# Patient Record
Sex: Female | Born: 1940 | Race: Black or African American | Hispanic: No | Marital: Married | Smoking: Never smoker
Health system: Southern US, Community
[De-identification: ages and names within clinical notes are randomized; demographics above are authoritative.]

## PROBLEM LIST (undated history)

## (undated) DIAGNOSIS — T7840XA Allergy, unspecified, initial encounter: Secondary | ICD-10-CM

## (undated) DIAGNOSIS — I10 Essential (primary) hypertension: Secondary | ICD-10-CM

## (undated) DIAGNOSIS — N189 Chronic kidney disease, unspecified: Secondary | ICD-10-CM

## (undated) DIAGNOSIS — J310 Chronic rhinitis: Secondary | ICD-10-CM

## (undated) DIAGNOSIS — R413 Other amnesia: Secondary | ICD-10-CM

## (undated) DIAGNOSIS — M25579 Pain in unspecified ankle and joints of unspecified foot: Secondary | ICD-10-CM

## (undated) DIAGNOSIS — E119 Type 2 diabetes mellitus without complications: Secondary | ICD-10-CM

## (undated) DIAGNOSIS — E785 Hyperlipidemia, unspecified: Secondary | ICD-10-CM

## (undated) DIAGNOSIS — M1990 Unspecified osteoarthritis, unspecified site: Secondary | ICD-10-CM

## (undated) HISTORY — DX: Essential (primary) hypertension: I10

## (undated) HISTORY — DX: Chronic rhinitis: J31.0

## (undated) HISTORY — PX: PARTIAL HYSTERECTOMY: SHX80

## (undated) HISTORY — DX: Other amnesia: R41.3

## (undated) HISTORY — DX: Pain in unspecified ankle and joints of unspecified foot: M25.579

## (undated) HISTORY — DX: Type 2 diabetes mellitus without complications: E11.9

## (undated) HISTORY — PX: OTHER SURGICAL HISTORY: SHX169

## (undated) HISTORY — DX: Allergy, unspecified, initial encounter: T78.40XA

## (undated) HISTORY — PX: CHOLECYSTECTOMY: SHX55

## (undated) HISTORY — DX: Chronic kidney disease, unspecified: N18.9

## (undated) HISTORY — DX: Hyperlipidemia, unspecified: E78.5

## (undated) HISTORY — PX: CATARACT EXTRACTION: SUR2

## (undated) HISTORY — DX: Unspecified osteoarthritis, unspecified site: M19.90

---
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
## 1998-05-05 ENCOUNTER — Encounter: Payer: Self-pay | Admitting: Internal Medicine

## 1998-05-05 ENCOUNTER — Ambulatory Visit (HOSPITAL_COMMUNITY): Admission: RE | Admit: 1998-05-05 | Discharge: 1998-05-05 | Payer: Self-pay | Admitting: Internal Medicine

## 1998-08-14 ENCOUNTER — Encounter: Payer: Self-pay | Admitting: Internal Medicine

## 1998-08-14 ENCOUNTER — Ambulatory Visit (HOSPITAL_COMMUNITY): Admission: RE | Admit: 1998-08-14 | Discharge: 1998-08-14 | Payer: Self-pay | Admitting: Internal Medicine

## 1999-09-09 ENCOUNTER — Encounter: Admission: RE | Admit: 1999-09-09 | Discharge: 1999-09-09 | Payer: Self-pay | Admitting: Internal Medicine

## 1999-09-09 ENCOUNTER — Encounter: Payer: Self-pay | Admitting: Internal Medicine

## 1999-09-10 ENCOUNTER — Encounter: Payer: Self-pay | Admitting: Internal Medicine

## 1999-09-10 ENCOUNTER — Ambulatory Visit (HOSPITAL_COMMUNITY): Admission: RE | Admit: 1999-09-10 | Discharge: 1999-09-10 | Payer: Self-pay | Admitting: Internal Medicine

## 2000-09-20 ENCOUNTER — Encounter: Payer: Self-pay | Admitting: Internal Medicine

## 2000-09-20 ENCOUNTER — Ambulatory Visit (HOSPITAL_COMMUNITY): Admission: RE | Admit: 2000-09-20 | Discharge: 2000-09-20 | Payer: Self-pay | Admitting: Internal Medicine

## 2001-09-27 ENCOUNTER — Encounter: Payer: Self-pay | Admitting: Internal Medicine

## 2001-09-27 ENCOUNTER — Ambulatory Visit (HOSPITAL_COMMUNITY): Admission: RE | Admit: 2001-09-27 | Discharge: 2001-09-27 | Payer: Self-pay | Admitting: Internal Medicine

## 2001-11-15 ENCOUNTER — Encounter: Payer: Self-pay | Admitting: Gastroenterology

## 2001-11-15 ENCOUNTER — Encounter: Admission: RE | Admit: 2001-11-15 | Discharge: 2001-11-15 | Payer: Self-pay | Admitting: Gastroenterology

## 2001-12-21 ENCOUNTER — Encounter: Payer: Self-pay | Admitting: Internal Medicine

## 2001-12-21 ENCOUNTER — Ambulatory Visit (HOSPITAL_COMMUNITY): Admission: RE | Admit: 2001-12-21 | Discharge: 2001-12-21 | Payer: Self-pay | Admitting: Internal Medicine

## 2002-03-06 ENCOUNTER — Ambulatory Visit (HOSPITAL_COMMUNITY): Admission: RE | Admit: 2002-03-06 | Discharge: 2002-03-06 | Payer: Self-pay | Admitting: Gastroenterology

## 2002-03-06 LAB — HM COLONOSCOPY: HM Colonoscopy: NORMAL

## 2002-10-03 ENCOUNTER — Encounter: Payer: Self-pay | Admitting: Internal Medicine

## 2002-10-03 ENCOUNTER — Ambulatory Visit (HOSPITAL_COMMUNITY): Admission: RE | Admit: 2002-10-03 | Discharge: 2002-10-03 | Payer: Self-pay | Admitting: Internal Medicine

## 2003-10-04 ENCOUNTER — Ambulatory Visit (HOSPITAL_COMMUNITY): Admission: RE | Admit: 2003-10-04 | Discharge: 2003-10-04 | Payer: Self-pay | Admitting: Internal Medicine

## 2004-05-03 ENCOUNTER — Emergency Department (HOSPITAL_COMMUNITY): Admission: EM | Admit: 2004-05-03 | Discharge: 2004-05-03 | Payer: Self-pay | Admitting: Emergency Medicine

## 2004-09-21 ENCOUNTER — Emergency Department (HOSPITAL_COMMUNITY): Admission: EM | Admit: 2004-09-21 | Discharge: 2004-09-21 | Payer: Self-pay | Admitting: *Deleted

## 2004-10-05 ENCOUNTER — Ambulatory Visit (HOSPITAL_COMMUNITY): Admission: RE | Admit: 2004-10-05 | Discharge: 2004-10-05 | Payer: Self-pay | Admitting: Internal Medicine

## 2005-10-07 ENCOUNTER — Ambulatory Visit (HOSPITAL_COMMUNITY): Admission: RE | Admit: 2005-10-07 | Discharge: 2005-10-07 | Payer: Self-pay | Admitting: Internal Medicine

## 2006-03-24 ENCOUNTER — Encounter: Admission: RE | Admit: 2006-03-24 | Discharge: 2006-03-24 | Payer: Self-pay | Admitting: Family Medicine

## 2006-05-16 ENCOUNTER — Encounter: Payer: Self-pay | Admitting: Internal Medicine

## 2006-08-30 ENCOUNTER — Ambulatory Visit: Payer: Self-pay | Admitting: Family Medicine

## 2006-08-30 DIAGNOSIS — M159 Polyosteoarthritis, unspecified: Secondary | ICD-10-CM | POA: Insufficient documentation

## 2006-08-30 DIAGNOSIS — I10 Essential (primary) hypertension: Secondary | ICD-10-CM | POA: Insufficient documentation

## 2006-09-05 ENCOUNTER — Ambulatory Visit: Payer: Self-pay | Admitting: Family Medicine

## 2006-09-09 ENCOUNTER — Telehealth (INDEPENDENT_AMBULATORY_CARE_PROVIDER_SITE_OTHER): Payer: Self-pay | Admitting: *Deleted

## 2006-09-09 LAB — CONVERTED CEMR LAB
AST: 30 units/L (ref 0–37)
BUN: 16 mg/dL (ref 6–23)
Creatinine, Ser: 1.5 mg/dL — ABNORMAL HIGH (ref 0.4–1.2)
Creatinine,U: 106.3 mg/dL
GFR calc Af Amer: 44 mL/min
GFR calc non Af Amer: 37 mL/min
Glucose, Bld: 176 mg/dL — ABNORMAL HIGH (ref 70–99)
HDL: 65.6 mg/dL (ref >39.0)
Microalb, Ur: 0.2 mg/dL (ref 0.0–1.9)
Potassium: 3.2 meq/L — ABNORMAL LOW (ref 3.5–5.1)
Total CHOL/HDL Ratio: 2.7

## 2006-09-12 ENCOUNTER — Ambulatory Visit: Payer: Self-pay | Admitting: Family Medicine

## 2006-09-13 ENCOUNTER — Telehealth (INDEPENDENT_AMBULATORY_CARE_PROVIDER_SITE_OTHER): Payer: Self-pay | Admitting: *Deleted

## 2006-09-13 LAB — CONVERTED CEMR LAB
Potassium: 2.9 meq/L — ABNORMAL LOW (ref 3.5–5.1)
TSH: 1.47 microintl units/mL (ref 0.35–5.50)

## 2006-09-19 ENCOUNTER — Encounter: Payer: Self-pay | Admitting: Internal Medicine

## 2006-09-26 ENCOUNTER — Telehealth (INDEPENDENT_AMBULATORY_CARE_PROVIDER_SITE_OTHER): Payer: Self-pay | Admitting: *Deleted

## 2006-09-26 ENCOUNTER — Ambulatory Visit: Payer: Self-pay | Admitting: Family Medicine

## 2006-09-26 LAB — CONVERTED CEMR LAB: Potassium: 3.6 meq/L (ref 3.5–5.1)

## 2006-09-27 ENCOUNTER — Telehealth (INDEPENDENT_AMBULATORY_CARE_PROVIDER_SITE_OTHER): Payer: Self-pay | Admitting: *Deleted

## 2006-10-18 ENCOUNTER — Ambulatory Visit (HOSPITAL_COMMUNITY): Admission: RE | Admit: 2006-10-18 | Discharge: 2006-10-18 | Payer: Self-pay | Admitting: Family Medicine

## 2006-10-19 ENCOUNTER — Encounter: Admission: RE | Admit: 2006-10-19 | Discharge: 2006-10-19 | Payer: Self-pay | Admitting: Family Medicine

## 2006-10-20 ENCOUNTER — Ambulatory Visit: Payer: Self-pay | Admitting: Family Medicine

## 2006-10-20 DIAGNOSIS — J3089 Other allergic rhinitis: Secondary | ICD-10-CM

## 2006-10-20 DIAGNOSIS — J302 Other seasonal allergic rhinitis: Secondary | ICD-10-CM | POA: Insufficient documentation

## 2006-11-03 ENCOUNTER — Encounter (INDEPENDENT_AMBULATORY_CARE_PROVIDER_SITE_OTHER): Payer: Self-pay | Admitting: Family Medicine

## 2006-11-10 ENCOUNTER — Telehealth (INDEPENDENT_AMBULATORY_CARE_PROVIDER_SITE_OTHER): Payer: Self-pay | Admitting: Family Medicine

## 2006-11-17 ENCOUNTER — Telehealth (INDEPENDENT_AMBULATORY_CARE_PROVIDER_SITE_OTHER): Payer: Self-pay | Admitting: *Deleted

## 2006-11-18 ENCOUNTER — Ambulatory Visit: Payer: Self-pay | Admitting: Family Medicine

## 2006-11-21 ENCOUNTER — Telehealth (INDEPENDENT_AMBULATORY_CARE_PROVIDER_SITE_OTHER): Payer: Self-pay | Admitting: *Deleted

## 2006-11-21 LAB — CONVERTED CEMR LAB
BUN: 20 mg/dL (ref 6–23)
CO2: 32 meq/L (ref 19–32)
Calcium: 9.2 mg/dL (ref 8.4–10.5)
GFR calc Af Amer: 41 mL/min
Glucose, Bld: 150 mg/dL — ABNORMAL HIGH (ref 70–99)
Potassium: 3.5 meq/L (ref 3.5–5.1)
Sodium: 142 meq/L (ref 135–145)

## 2006-11-30 ENCOUNTER — Encounter (INDEPENDENT_AMBULATORY_CARE_PROVIDER_SITE_OTHER): Payer: Self-pay | Admitting: Family Medicine

## 2006-12-02 ENCOUNTER — Ambulatory Visit: Payer: Self-pay | Admitting: Family Medicine

## 2006-12-02 ENCOUNTER — Telehealth (INDEPENDENT_AMBULATORY_CARE_PROVIDER_SITE_OTHER): Payer: Self-pay | Admitting: *Deleted

## 2006-12-04 LAB — CONVERTED CEMR LAB
Creatinine, Ser: 1.2 mg/dL (ref 0.4–1.2)
GFR calc non Af Amer: 47 mL/min
Potassium: 3.9 meq/L (ref 3.5–5.1)
Sodium: 142 meq/L (ref 135–145)

## 2006-12-05 ENCOUNTER — Telehealth (INDEPENDENT_AMBULATORY_CARE_PROVIDER_SITE_OTHER): Payer: Self-pay | Admitting: *Deleted

## 2006-12-06 ENCOUNTER — Encounter (INDEPENDENT_AMBULATORY_CARE_PROVIDER_SITE_OTHER): Payer: Self-pay | Admitting: Family Medicine

## 2006-12-06 ENCOUNTER — Telehealth (INDEPENDENT_AMBULATORY_CARE_PROVIDER_SITE_OTHER): Payer: Self-pay | Admitting: *Deleted

## 2006-12-08 ENCOUNTER — Encounter (INDEPENDENT_AMBULATORY_CARE_PROVIDER_SITE_OTHER): Payer: Self-pay | Admitting: Family Medicine

## 2006-12-08 DIAGNOSIS — M25579 Pain in unspecified ankle and joints of unspecified foot: Secondary | ICD-10-CM | POA: Insufficient documentation

## 2006-12-21 ENCOUNTER — Ambulatory Visit: Payer: Self-pay | Admitting: Family Medicine

## 2006-12-21 LAB — CONVERTED CEMR LAB
BUN: 18 mg/dL (ref 6–23)
CO2: 31 meq/L (ref 19–32)
Calcium: 8.9 mg/dL (ref 8.4–10.5)
Creatinine, Ser: 1.4 mg/dL — ABNORMAL HIGH (ref 0.4–1.2)
GFR calc Af Amer: 48 mL/min
GFR calc non Af Amer: 40 mL/min
Potassium: 4 meq/L (ref 3.5–5.1)
Sodium: 140 meq/L (ref 135–145)

## 2006-12-22 ENCOUNTER — Telehealth (INDEPENDENT_AMBULATORY_CARE_PROVIDER_SITE_OTHER): Payer: Self-pay | Admitting: *Deleted

## 2007-01-20 ENCOUNTER — Ambulatory Visit: Payer: Self-pay | Admitting: Family Medicine

## 2007-01-25 ENCOUNTER — Ambulatory Visit: Payer: Self-pay | Admitting: Family Medicine

## 2007-01-25 DIAGNOSIS — E785 Hyperlipidemia, unspecified: Secondary | ICD-10-CM | POA: Insufficient documentation

## 2007-01-27 ENCOUNTER — Encounter (INDEPENDENT_AMBULATORY_CARE_PROVIDER_SITE_OTHER): Payer: Self-pay | Admitting: *Deleted

## 2007-02-06 ENCOUNTER — Telehealth (INDEPENDENT_AMBULATORY_CARE_PROVIDER_SITE_OTHER): Payer: Self-pay | Admitting: *Deleted

## 2007-02-07 ENCOUNTER — Encounter (INDEPENDENT_AMBULATORY_CARE_PROVIDER_SITE_OTHER): Payer: Self-pay | Admitting: *Deleted

## 2007-02-08 ENCOUNTER — Telehealth (INDEPENDENT_AMBULATORY_CARE_PROVIDER_SITE_OTHER): Payer: Self-pay | Admitting: *Deleted

## 2007-02-08 ENCOUNTER — Ambulatory Visit: Payer: Self-pay | Admitting: Family Medicine

## 2007-02-08 LAB — CONVERTED CEMR LAB
ALT: 15 units/L (ref 0–35)
AST: 19 units/L (ref 0–37)

## 2007-02-09 ENCOUNTER — Encounter (INDEPENDENT_AMBULATORY_CARE_PROVIDER_SITE_OTHER): Payer: Self-pay | Admitting: *Deleted

## 2007-03-06 ENCOUNTER — Encounter (INDEPENDENT_AMBULATORY_CARE_PROVIDER_SITE_OTHER): Payer: Self-pay | Admitting: Family Medicine

## 2007-04-17 ENCOUNTER — Ambulatory Visit: Payer: Self-pay | Admitting: Family Medicine

## 2007-04-17 LAB — CONVERTED CEMR LAB
Cholesterol, target level: 200 mg/dL
LDL Goal: 100 mg/dL

## 2007-04-18 ENCOUNTER — Encounter (INDEPENDENT_AMBULATORY_CARE_PROVIDER_SITE_OTHER): Payer: Self-pay | Admitting: *Deleted

## 2007-04-18 LAB — CONVERTED CEMR LAB
Microalb, Ur: 2.5 mg/dL — ABNORMAL HIGH (ref 0.0–1.9)
VLDL: 25 mg/dL (ref 0–40)

## 2007-04-26 ENCOUNTER — Ambulatory Visit: Payer: Self-pay | Admitting: Family Medicine

## 2007-05-01 ENCOUNTER — Telehealth (INDEPENDENT_AMBULATORY_CARE_PROVIDER_SITE_OTHER): Payer: Self-pay | Admitting: *Deleted

## 2007-05-29 ENCOUNTER — Ambulatory Visit: Payer: Self-pay | Admitting: Internal Medicine

## 2007-06-01 ENCOUNTER — Encounter (INDEPENDENT_AMBULATORY_CARE_PROVIDER_SITE_OTHER): Payer: Self-pay | Admitting: *Deleted

## 2007-06-14 ENCOUNTER — Telehealth (INDEPENDENT_AMBULATORY_CARE_PROVIDER_SITE_OTHER): Payer: Self-pay | Admitting: *Deleted

## 2007-06-15 ENCOUNTER — Ambulatory Visit: Payer: Self-pay | Admitting: Internal Medicine

## 2007-06-19 ENCOUNTER — Encounter: Payer: Self-pay | Admitting: Internal Medicine

## 2007-06-19 ENCOUNTER — Telehealth: Payer: Self-pay | Admitting: Internal Medicine

## 2007-06-23 ENCOUNTER — Encounter: Payer: Self-pay | Admitting: Internal Medicine

## 2007-07-13 ENCOUNTER — Ambulatory Visit: Payer: Self-pay | Admitting: Internal Medicine

## 2007-07-15 LAB — CONVERTED CEMR LAB
Creatinine, Ser: 1.4 mg/dL — ABNORMAL HIGH (ref 0.4–1.2)
HCT: 37.4 % (ref 36.0–46.0)
Hemoglobin: 12.2 g/dL (ref 12.0–15.0)
Hgb A1c MFr Bld: 7 % — ABNORMAL HIGH (ref 4.6–6.0)
Lymphocytes Relative: 43.9 % (ref 12.0–46.0)
MCHC: 32.7 g/dL (ref 30.0–36.0)
Neutro Abs: 1.1 10*3/uL — ABNORMAL LOW (ref 1.4–7.7)
Neutrophils Relative %: 32.7 % — ABNORMAL LOW (ref 43.0–77.0)
RBC: 3.93 M/uL (ref 3.87–5.11)
RDW: 13.8 % (ref 11.5–14.6)
WBC: 3.5 10*3/uL — ABNORMAL LOW (ref 4.5–10.5)

## 2007-07-18 ENCOUNTER — Encounter (INDEPENDENT_AMBULATORY_CARE_PROVIDER_SITE_OTHER): Payer: Self-pay | Admitting: *Deleted

## 2007-07-19 ENCOUNTER — Ambulatory Visit: Payer: Self-pay | Admitting: Cardiology

## 2007-07-20 ENCOUNTER — Encounter (INDEPENDENT_AMBULATORY_CARE_PROVIDER_SITE_OTHER): Payer: Self-pay | Admitting: *Deleted

## 2007-07-21 ENCOUNTER — Telehealth (INDEPENDENT_AMBULATORY_CARE_PROVIDER_SITE_OTHER): Payer: Self-pay | Admitting: *Deleted

## 2007-07-24 ENCOUNTER — Encounter (INDEPENDENT_AMBULATORY_CARE_PROVIDER_SITE_OTHER): Payer: Self-pay | Admitting: *Deleted

## 2007-07-28 ENCOUNTER — Telehealth (INDEPENDENT_AMBULATORY_CARE_PROVIDER_SITE_OTHER): Payer: Self-pay | Admitting: *Deleted

## 2007-07-31 ENCOUNTER — Ambulatory Visit: Payer: Self-pay | Admitting: Internal Medicine

## 2007-08-02 ENCOUNTER — Encounter (INDEPENDENT_AMBULATORY_CARE_PROVIDER_SITE_OTHER): Payer: Self-pay | Admitting: *Deleted

## 2007-08-08 ENCOUNTER — Telehealth (INDEPENDENT_AMBULATORY_CARE_PROVIDER_SITE_OTHER): Payer: Self-pay | Admitting: *Deleted

## 2007-08-14 ENCOUNTER — Telehealth (INDEPENDENT_AMBULATORY_CARE_PROVIDER_SITE_OTHER): Payer: Self-pay | Admitting: *Deleted

## 2007-08-24 ENCOUNTER — Telehealth (INDEPENDENT_AMBULATORY_CARE_PROVIDER_SITE_OTHER): Payer: Self-pay | Admitting: *Deleted

## 2007-09-04 ENCOUNTER — Encounter: Payer: Self-pay | Admitting: Internal Medicine

## 2007-10-07 ENCOUNTER — Telehealth (INDEPENDENT_AMBULATORY_CARE_PROVIDER_SITE_OTHER): Payer: Self-pay | Admitting: *Deleted

## 2007-10-23 ENCOUNTER — Ambulatory Visit (HOSPITAL_COMMUNITY): Admission: RE | Admit: 2007-10-23 | Discharge: 2007-10-23 | Payer: Self-pay | Admitting: Internal Medicine

## 2007-10-26 ENCOUNTER — Telehealth (INDEPENDENT_AMBULATORY_CARE_PROVIDER_SITE_OTHER): Payer: Self-pay | Admitting: *Deleted

## 2007-11-14 ENCOUNTER — Ambulatory Visit: Payer: Self-pay | Admitting: Internal Medicine

## 2007-11-20 ENCOUNTER — Telehealth (INDEPENDENT_AMBULATORY_CARE_PROVIDER_SITE_OTHER): Payer: Self-pay | Admitting: *Deleted

## 2007-11-20 ENCOUNTER — Encounter: Payer: Self-pay | Admitting: Internal Medicine

## 2007-11-20 LAB — CONVERTED CEMR LAB
ALT: 17 units/L (ref 0–35)
AST: 21 units/L (ref 0–37)
Calcium: 9.7 mg/dL (ref 8.4–10.5)
Chloride: 105 meq/L (ref 96–112)
Creatinine,U: 46.5 mg/dL
GFR calc non Af Amer: 43 mL/min
Glucose, Bld: 133 mg/dL — ABNORMAL HIGH (ref 70–99)
Hgb A1c MFr Bld: 7.9 % — ABNORMAL HIGH (ref 4.6–6.0)
Sodium: 142 meq/L (ref 135–145)

## 2007-11-28 ENCOUNTER — Ambulatory Visit: Payer: Self-pay | Admitting: Internal Medicine

## 2007-11-30 ENCOUNTER — Encounter: Payer: Self-pay | Admitting: Internal Medicine

## 2007-12-01 ENCOUNTER — Telehealth (INDEPENDENT_AMBULATORY_CARE_PROVIDER_SITE_OTHER): Payer: Self-pay | Admitting: *Deleted

## 2007-12-14 ENCOUNTER — Encounter: Payer: Self-pay | Admitting: Internal Medicine

## 2007-12-26 ENCOUNTER — Ambulatory Visit: Payer: Self-pay | Admitting: Internal Medicine

## 2008-02-06 ENCOUNTER — Telehealth (INDEPENDENT_AMBULATORY_CARE_PROVIDER_SITE_OTHER): Payer: Self-pay | Admitting: *Deleted

## 2008-02-26 ENCOUNTER — Telehealth (INDEPENDENT_AMBULATORY_CARE_PROVIDER_SITE_OTHER): Payer: Self-pay | Admitting: *Deleted

## 2008-02-29 ENCOUNTER — Telehealth (INDEPENDENT_AMBULATORY_CARE_PROVIDER_SITE_OTHER): Payer: Self-pay | Admitting: *Deleted

## 2008-03-05 ENCOUNTER — Telehealth (INDEPENDENT_AMBULATORY_CARE_PROVIDER_SITE_OTHER): Payer: Self-pay | Admitting: *Deleted

## 2008-03-18 ENCOUNTER — Telehealth (INDEPENDENT_AMBULATORY_CARE_PROVIDER_SITE_OTHER): Payer: Self-pay | Admitting: *Deleted

## 2008-03-28 ENCOUNTER — Ambulatory Visit: Payer: Self-pay | Admitting: Internal Medicine

## 2008-04-01 ENCOUNTER — Encounter: Payer: Self-pay | Admitting: Internal Medicine

## 2008-04-02 ENCOUNTER — Ambulatory Visit: Payer: Self-pay | Admitting: Internal Medicine

## 2008-04-04 ENCOUNTER — Telehealth (INDEPENDENT_AMBULATORY_CARE_PROVIDER_SITE_OTHER): Payer: Self-pay | Admitting: *Deleted

## 2008-04-04 ENCOUNTER — Encounter (INDEPENDENT_AMBULATORY_CARE_PROVIDER_SITE_OTHER): Payer: Self-pay | Admitting: *Deleted

## 2008-04-04 LAB — CONVERTED CEMR LAB
Bilirubin, Direct: 0.1 mg/dL (ref 0.0–0.3)
Hgb A1c MFr Bld: 6.7 % — ABNORMAL HIGH (ref 4.6–6.0)

## 2008-05-20 ENCOUNTER — Telehealth (INDEPENDENT_AMBULATORY_CARE_PROVIDER_SITE_OTHER): Payer: Self-pay | Admitting: *Deleted

## 2008-06-10 ENCOUNTER — Ambulatory Visit: Payer: Self-pay | Admitting: Internal Medicine

## 2008-06-10 DIAGNOSIS — R609 Edema, unspecified: Secondary | ICD-10-CM | POA: Insufficient documentation

## 2008-07-01 ENCOUNTER — Ambulatory Visit: Payer: Self-pay | Admitting: Internal Medicine

## 2008-07-12 ENCOUNTER — Encounter: Payer: Self-pay | Admitting: Internal Medicine

## 2008-07-12 ENCOUNTER — Ambulatory Visit: Payer: Self-pay

## 2008-07-25 ENCOUNTER — Encounter (INDEPENDENT_AMBULATORY_CARE_PROVIDER_SITE_OTHER): Payer: Self-pay | Admitting: *Deleted

## 2008-07-25 ENCOUNTER — Telehealth (INDEPENDENT_AMBULATORY_CARE_PROVIDER_SITE_OTHER): Payer: Self-pay | Admitting: *Deleted

## 2008-08-13 ENCOUNTER — Ambulatory Visit: Payer: Self-pay | Admitting: Internal Medicine

## 2008-09-18 ENCOUNTER — Telehealth: Payer: Self-pay | Admitting: Internal Medicine

## 2008-09-25 ENCOUNTER — Telehealth (INDEPENDENT_AMBULATORY_CARE_PROVIDER_SITE_OTHER): Payer: Self-pay | Admitting: *Deleted

## 2008-09-30 ENCOUNTER — Telehealth (INDEPENDENT_AMBULATORY_CARE_PROVIDER_SITE_OTHER): Payer: Self-pay | Admitting: *Deleted

## 2008-09-30 ENCOUNTER — Ambulatory Visit: Payer: Self-pay | Admitting: Family Medicine

## 2008-09-30 DIAGNOSIS — K219 Gastro-esophageal reflux disease without esophagitis: Secondary | ICD-10-CM | POA: Insufficient documentation

## 2008-10-01 ENCOUNTER — Ambulatory Visit: Payer: Self-pay | Admitting: Family Medicine

## 2008-10-03 ENCOUNTER — Emergency Department (HOSPITAL_COMMUNITY): Admission: EM | Admit: 2008-10-03 | Discharge: 2008-10-03 | Payer: Self-pay | Admitting: Emergency Medicine

## 2008-10-03 ENCOUNTER — Encounter (INDEPENDENT_AMBULATORY_CARE_PROVIDER_SITE_OTHER): Payer: Self-pay | Admitting: *Deleted

## 2008-10-07 ENCOUNTER — Telehealth (INDEPENDENT_AMBULATORY_CARE_PROVIDER_SITE_OTHER): Payer: Self-pay | Admitting: *Deleted

## 2008-10-11 ENCOUNTER — Ambulatory Visit: Payer: Self-pay | Admitting: Internal Medicine

## 2008-10-11 LAB — CONVERTED CEMR LAB
Bilirubin Urine: NEGATIVE
Blood in Urine, dipstick: NEGATIVE
Glucose, Urine, Semiquant: NEGATIVE
Ketones, urine, test strip: NEGATIVE
Nitrite: NEGATIVE
Protein, U semiquant: NEGATIVE
WBC Urine, dipstick: NEGATIVE

## 2008-10-12 ENCOUNTER — Encounter: Payer: Self-pay | Admitting: Internal Medicine

## 2008-10-12 LAB — CONVERTED CEMR LAB

## 2008-10-24 ENCOUNTER — Ambulatory Visit (HOSPITAL_COMMUNITY): Admission: RE | Admit: 2008-10-24 | Discharge: 2008-10-24 | Payer: Self-pay | Admitting: Internal Medicine

## 2008-10-25 ENCOUNTER — Ambulatory Visit: Payer: Self-pay | Admitting: Family Medicine

## 2008-10-31 ENCOUNTER — Encounter: Payer: Self-pay | Admitting: Internal Medicine

## 2008-10-31 ENCOUNTER — Encounter: Admission: RE | Admit: 2008-10-31 | Discharge: 2008-10-31 | Payer: Self-pay | Admitting: Internal Medicine

## 2008-11-08 ENCOUNTER — Ambulatory Visit: Payer: Self-pay | Admitting: Internal Medicine

## 2008-11-08 ENCOUNTER — Encounter (INDEPENDENT_AMBULATORY_CARE_PROVIDER_SITE_OTHER): Payer: Self-pay | Admitting: *Deleted

## 2008-11-18 ENCOUNTER — Telehealth (INDEPENDENT_AMBULATORY_CARE_PROVIDER_SITE_OTHER): Payer: Self-pay | Admitting: *Deleted

## 2008-11-22 ENCOUNTER — Ambulatory Visit: Payer: Self-pay | Admitting: Family Medicine

## 2008-12-24 ENCOUNTER — Ambulatory Visit: Payer: Self-pay | Admitting: Internal Medicine

## 2009-01-14 ENCOUNTER — Ambulatory Visit: Payer: Self-pay | Admitting: Internal Medicine

## 2009-01-15 ENCOUNTER — Telehealth (INDEPENDENT_AMBULATORY_CARE_PROVIDER_SITE_OTHER): Payer: Self-pay | Admitting: *Deleted

## 2009-01-15 LAB — CONVERTED CEMR LAB
BUN: 14 mg/dL (ref 6–23)
CO2: 30 meq/L (ref 19–32)
Calcium: 9.3 mg/dL (ref 8.4–10.5)
Chloride: 103 meq/L (ref 96–112)
Creatinine, Ser: 1.3 mg/dL — ABNORMAL HIGH (ref 0.4–1.2)
Creatinine,U: 161.7 mg/dL
GFR calc non Af Amer: 51.95 mL/min (ref >60)
Glucose, Bld: 175 mg/dL — ABNORMAL HIGH (ref 70–99)
Hgb A1c MFr Bld: 9.3 % — ABNORMAL HIGH (ref 4.6–6.5)
Microalb Creat Ratio: 3.7 mg/g (ref 0.0–30.0)
Microalb, Ur: 0.6 mg/dL (ref 0.0–1.9)
Potassium: 3.8 meq/L (ref 3.5–5.1)
Sodium: 140 meq/L (ref 135–145)

## 2009-03-07 ENCOUNTER — Encounter: Payer: Self-pay | Admitting: Internal Medicine

## 2009-03-10 ENCOUNTER — Telehealth (INDEPENDENT_AMBULATORY_CARE_PROVIDER_SITE_OTHER): Payer: Self-pay | Admitting: *Deleted

## 2009-04-01 ENCOUNTER — Telehealth (INDEPENDENT_AMBULATORY_CARE_PROVIDER_SITE_OTHER): Payer: Self-pay | Admitting: *Deleted

## 2009-04-23 ENCOUNTER — Ambulatory Visit: Payer: Self-pay | Admitting: Internal Medicine

## 2009-04-25 LAB — CONVERTED CEMR LAB
Hgb A1c MFr Bld: 10 % — ABNORMAL HIGH (ref 4.6–6.5)
LDL Cholesterol: 43 mg/dL (ref 0–99)

## 2009-04-28 ENCOUNTER — Ambulatory Visit: Payer: Self-pay | Admitting: Internal Medicine

## 2009-05-07 ENCOUNTER — Telehealth: Payer: Self-pay | Admitting: Internal Medicine

## 2009-05-14 ENCOUNTER — Ambulatory Visit: Payer: Self-pay | Admitting: Internal Medicine

## 2009-05-22 ENCOUNTER — Telehealth (INDEPENDENT_AMBULATORY_CARE_PROVIDER_SITE_OTHER): Payer: Self-pay | Admitting: *Deleted

## 2009-06-02 ENCOUNTER — Telehealth: Payer: Self-pay | Admitting: Internal Medicine

## 2009-06-10 ENCOUNTER — Telehealth (INDEPENDENT_AMBULATORY_CARE_PROVIDER_SITE_OTHER): Payer: Self-pay | Admitting: *Deleted

## 2009-06-11 ENCOUNTER — Encounter: Payer: Self-pay | Admitting: Internal Medicine

## 2009-06-17 ENCOUNTER — Telehealth: Payer: Self-pay | Admitting: Internal Medicine

## 2009-06-25 ENCOUNTER — Telehealth: Payer: Self-pay | Admitting: Internal Medicine

## 2009-06-27 ENCOUNTER — Encounter: Payer: Self-pay | Admitting: Internal Medicine

## 2009-07-01 ENCOUNTER — Encounter: Payer: Self-pay | Admitting: Internal Medicine

## 2009-07-09 ENCOUNTER — Telehealth: Payer: Self-pay | Admitting: Internal Medicine

## 2009-08-11 ENCOUNTER — Other Ambulatory Visit: Admission: RE | Admit: 2009-08-11 | Discharge: 2009-08-11 | Payer: Self-pay | Admitting: Internal Medicine

## 2009-08-11 ENCOUNTER — Ambulatory Visit: Payer: Self-pay | Admitting: Internal Medicine

## 2009-08-13 ENCOUNTER — Encounter: Payer: Self-pay | Admitting: Internal Medicine

## 2009-08-13 ENCOUNTER — Ambulatory Visit: Payer: Self-pay | Admitting: Internal Medicine

## 2009-08-14 ENCOUNTER — Telehealth: Payer: Self-pay | Admitting: Internal Medicine

## 2009-08-14 LAB — CONVERTED CEMR LAB
Basophils Relative: 0.4 % (ref 0.0–3.0)
CO2: 27 meq/L (ref 19–32)
Calcium: 9.5 mg/dL (ref 8.4–10.5)
Chloride: 103 meq/L (ref 96–112)
Creatinine, Ser: 1.5 mg/dL — ABNORMAL HIGH (ref 0.4–1.2)
Eosinophils Relative: 1.8 % (ref 0.0–5.0)
HCT: 38.9 % (ref 36.0–46.0)
Lymphs Abs: 2.3 10*3/uL (ref 0.7–4.0)
MCHC: 33.3 g/dL (ref 30.0–36.0)
Monocytes Relative: 10.4 % (ref 3.0–12.0)
Neutrophils Relative %: 27.6 % — ABNORMAL LOW (ref 43.0–77.0)
Platelets: 187 10*3/uL (ref 150.0–400.0)
Potassium: 4.6 meq/L (ref 3.5–5.1)
RBC: 4.05 M/uL (ref 3.87–5.11)
Sodium: 138 meq/L (ref 135–145)
Vit D, 25-Hydroxy: 23 ng/mL — ABNORMAL LOW (ref 30–89)

## 2009-08-18 ENCOUNTER — Telehealth (INDEPENDENT_AMBULATORY_CARE_PROVIDER_SITE_OTHER): Payer: Self-pay | Admitting: *Deleted

## 2009-08-19 ENCOUNTER — Encounter: Payer: Self-pay | Admitting: Internal Medicine

## 2009-08-21 ENCOUNTER — Encounter: Payer: Self-pay | Admitting: Internal Medicine

## 2009-08-28 ENCOUNTER — Telehealth: Payer: Self-pay | Admitting: Internal Medicine

## 2009-09-08 ENCOUNTER — Encounter: Payer: Self-pay | Admitting: Internal Medicine

## 2009-09-12 ENCOUNTER — Telehealth (INDEPENDENT_AMBULATORY_CARE_PROVIDER_SITE_OTHER): Payer: Self-pay | Admitting: *Deleted

## 2009-10-13 ENCOUNTER — Telehealth: Payer: Self-pay | Admitting: Internal Medicine

## 2009-11-03 ENCOUNTER — Ambulatory Visit (HOSPITAL_COMMUNITY): Admission: RE | Admit: 2009-11-03 | Discharge: 2009-11-03 | Payer: Self-pay | Admitting: Internal Medicine

## 2009-11-03 LAB — HM MAMMOGRAPHY: HM Mammogram: NORMAL

## 2009-12-12 ENCOUNTER — Ambulatory Visit: Payer: Self-pay | Admitting: Internal Medicine

## 2009-12-12 LAB — HM DIABETES FOOT EXAM

## 2009-12-16 LAB — CONVERTED CEMR LAB
GFR calc non Af Amer: 43.27 mL/min (ref >60)
Glucose, Bld: 102 mg/dL — ABNORMAL HIGH (ref 70–99)
Hgb A1c MFr Bld: 8.7 % — ABNORMAL HIGH (ref 4.6–6.5)
Potassium: 4.1 meq/L (ref 3.5–5.1)
Sodium: 143 meq/L (ref 135–145)

## 2009-12-30 ENCOUNTER — Telehealth: Payer: Self-pay | Admitting: Internal Medicine

## 2010-01-13 ENCOUNTER — Telehealth: Payer: Self-pay | Admitting: Internal Medicine

## 2010-01-28 ENCOUNTER — Telehealth: Payer: Self-pay | Admitting: Internal Medicine

## 2010-02-04 ENCOUNTER — Encounter: Payer: Self-pay | Admitting: Internal Medicine

## 2010-03-22 LAB — CONVERTED CEMR LAB
AST: 20 units/L
Basophils Relative: 0.3 % (ref 0.0–3.0)
Calcium: 9.7 mg/dL (ref 8.4–10.5)
Eosinophils Relative: 3.1 % (ref 0.0–5.0)
GFR calc non Af Amer: 57.07 mL/min (ref >60)
Glucose, Bld: 118 mg/dL — ABNORMAL HIGH (ref 70–99)
HCT: 36.2 % (ref 36.0–46.0)
MCHC: 34.2 g/dL (ref 30.0–36.0)
MCV: 96.2 fL (ref 78.0–100.0)
Monocytes Relative: 18.1 % — ABNORMAL HIGH (ref 3.0–12.0)
Neutro Abs: 0.8 10*3/uL — ABNORMAL LOW (ref 1.4–7.7)
RBC: 3.76 M/uL — ABNORMAL LOW (ref 3.87–5.11)
RDW: 14 % (ref 11.5–14.6)

## 2010-03-24 NOTE — Assessment & Plan Note (Signed)
Summary: 4 MONTH FOLLOWUP//KN   Vital Signs:  Patient profile:   70 year old female Weight:      232.13 pounds Pulse rate:   95 / minute Pulse rhythm:   regular BP sitting:   128 / 86  (left arm) Cuff size:   large  Vitals Entered By: Allyn Kenner CMA (December 12, 2009 10:00 AM) CC: 4 month f/u- fasting  Comments c/o fingers cramping x 2 weeks  [facility]   History of Present Illness: ROV  HYPERTENSION -ambulatory BPs in the 120/70s  DIABETES-- ambulatory CBGs around  120s , last eye checked  ~ 4 months   HYPERLIPIDEMIA -- good medication compliance   OSTEOARTHROSIS-- hand pain x 2 weeks , symptoms started w/ weather change mostly at [place]     Current Medications (verified): 1)  Lantus Solostar 100 Unit/ml Soln (Insulin Glargine) .... 35  Units At Bedtime 2)  Januvia 100 Mg Tabs (Sitagliptin Phosphate) .Marland Kitchen.. 1 By Mouth Once Daily 3)  Lipitor 10 Mg  Tabs (Atorvastatin Calcium) .Marland Kitchen.. 1 By Mouth Qd 4)  Furosemide 20 Mg  Tabs (Furosemide) .... Take 2  Tablets  Daily 5)  Benicar 20 Mg Tabs (Olmesartan Medoxomil) .... Hold 6)  Azor 10-40 Mg Tabs (Amlodipine-Olmesartan) .Marland Kitchen.. 1 A Day 7)  Astepro 137 Mcg/spray  Soln (Azelastine Hcl) .... 2 Puffs On Each Side of The Nose  Two Times A Day 8)  Flonase 50 Mcg/act Susp (Fluticasone Propionate) .... 2 Puff Once Daily 9)  Prilosec 20 Mg Cpdr (Omeprazole) .... Take 1 Tab Once Daily 10)  Celebrex 200 Mg Caps (Celecoxib) .Marland Kitchen.. 1 By Mouth Once Daily 11)  Allegra 180 Mg Tabs (Fexofenadine Hcl) .Marland Kitchen.. 1 By Mouth Once Daily 12)  Baby Aspirin 81 Mg  Chew (Aspirin) 13)  Loratadine 10 Mg Tabs (Loratadine) .Marland Kitchen.. 1 By Mouth Daily 14)  Pen Needles 31g X 6 Mm Misc (Insulin Pen Needle) .Marland Kitchen.. 1 Daily 15)  Ergocalciferol 50000 Unit Caps (Ergocalciferol) .... Take 1 Tab Weekly  Allergies (verified): No Known Drug Allergies  Past History:  Past Medical History: Reviewed history from 08/11/2009 and no changes  required. HYPERTENSION  DIABETES MELLITUS, TYPE II   HYPERLIPIDEMIA  OSTEOARTHROSIS, GENERALIZED, MULTIPLE SITES   ANKLE PAIN, CHRONIC  RHINITIS, ALLERGIC NOS    Past Surgical History: Reviewed history from 11/14/2007 and no changes required. Cholecystectomy Hysterectomy, partial in her 23s  Social History: Reviewed history from 04/23/2009 and no changes required. Retired Married, husband is a Company secretary  2 children Never Smoked Alcohol use-no Drug use-no Regular exercise-yes: walking   Review of Systems CV:  Denies chest pain or discomfort; still occasionally B ankle  edema if she is up all day, symptoms not severe . Resp:  Denies wheezing; mild cough, mostly nocturnal  some sputum, clear. GI:  Denies nausea and vomiting; no GERD symptoms .  Physical Exam  General:  alert, well-developed, and well-nourished.   Lungs:  normal respiratory effort, no intercostal retractions, no accessory muscle use, and normal breath sounds.   Heart:  normal rate, regular rhythm, and no murmur.   Pulses:  normal pedal pulses bilaterally  Extremities:  no pretibial edema bilaterally   Diabetes Management Exam:    Foot Exam (with socks and/or shoes not present):       Sensory-Pinprick/Light touch:          Left medial foot (L-4): normal          Left dorsal foot (L-5): normal  Left lateral foot (S-1): normal          Right medial foot (L-4): normal          Right dorsal foot (L-5): normal          Right lateral foot (S-1): normal       Sensory-Monofilament:          Left foot: normal          Right foot: normal       Inspection:          Left foot: abnormal             Comments: plantar callous           Right foot: normal       Nails:          Left foot: normal          Right foot: normal   Impression & Recommendations:  Problem # 1:  EDEMA (ICD-782.3) well controlled  Her updated medication list for this problem includes:    Furosemide 20 Mg Tabs (Furosemide) .Marland Kitchen...  Take 2  tablets  daily  Problem # 2:  HYPERTENSION (ICD-401.9) last creat. slightly  elevated, labs  at goal  Her updated medication list for this problem includes:    Furosemide 20 Mg Tabs (Furosemide) .Marland Kitchen... Take 2  tablets  daily    Benicar 20 Mg Tabs (Olmesartan medoxomil) ..... Hold    Azor 10-40 Mg Tabs (Amlodipine-olmesartan) .Marland Kitchen... 1 a day  BP today: 128/86 Prior BP: 122/78 (08/11/2009)  Labs Reviewed: K+: 4.6 (08/11/2009) Creat: : 1.5 (08/11/2009)   Chol: 148 (04/23/2009)   HDL: 83.10 (04/23/2009)   LDL: 43 (04/23/2009)   TG: 111.0 (04/23/2009)  Orders: Venipuncture [id number]) TLB-BMP (Basic Metabolic Panel-BMET) (99991111) Specimen Handling (99000)  Problem # 3:  DIABETES MELLITUS, TYPE II (ICD-250.00) saw eye doctor 4 months ago info provided regards feet care  labs  diet-exercise! Her updated medication list for this problem includes:    Lantus Solostar 100 Unit/ml Soln (Insulin glargine) .Marland KitchenMarland KitchenMarland KitchenMarland Kitchen 35  units at bedtime    Januvia 100 Mg Tabs (Sitagliptin phosphate) .Marland Kitchen... 1 by mouth once daily    Benicar 20 Mg Tabs (Olmesartan medoxomil) ..... Hold    Azor 10-40 Mg Tabs (Amlodipine-olmesartan) .Marland Kitchen... 1 a day    Baby Aspirin 81 Mg Chew (Aspirin)  Labs Reviewed: Creat: 1.5 (08/11/2009)    Reviewed HgBA1c results: 8.6 (08/11/2009)  10.0 (04/23/2009)  Orders: TLB-A1C / Hgb A1C (Glycohemoglobin) (83036-A1C) Specimen Handling (99000)  Problem # 4:  OSTEOARTHROSIS, GENERALIZED, MULTIPLE SITES (ICD-715.09) hand pain likely OA Her updated medication list for this problem includes:    Celebrex 200 Mg Caps (Celecoxib) .Marland Kitchen... 1 by mouth once daily    Baby Aspirin 81 Mg Chew (Aspirin)  Complete Medication List: 1)  Lantus Solostar 100 Unit/ml Soln (Insulin glargine) .... 35  units at bedtime 2)  Januvia 100 Mg Tabs (Sitagliptin phosphate) .Marland Kitchen.. 1 by mouth once daily 3)  Lipitor 10 Mg Tabs (Atorvastatin calcium) .Marland Kitchen.. 1 by mouth qd 4)  Furosemide 20 Mg Tabs (Furosemide) ....  Take 2  tablets  daily 5)  Benicar 20 Mg Tabs (Olmesartan medoxomil) .... Hold 6)  Azor 10-40 Mg Tabs (Amlodipine-olmesartan) .Marland Kitchen.. 1 a day 7)  Astepro 137 Mcg/spray Soln (Azelastine hcl) .... 2 puffs on each side of the nose  two times a day 8)  Flonase 50 Mcg/act Susp (Fluticasone propionate) .... 2 puff once daily 9)  Prilosec 20 Mg Cpdr (  Omeprazole) .... Take 1 tab once daily 10)  Celebrex 200 Mg Caps (Celecoxib) .Marland Kitchen.. 1 by mouth once daily 11)  Allegra 180 Mg Tabs (Fexofenadine hcl) .Marland Kitchen.. 1 by mouth once daily 12)  Baby Aspirin 81 Mg Chew (Aspirin) 13)  Loratadine 10 Mg Tabs (Loratadine) .Marland Kitchen.. 1 by mouth daily 14)  Pen Needles 31g X 6 Mm Misc (Insulin pen needle) .Marland Kitchen.. 1 daily 15)  Ergocalciferol 50000 Unit Caps (Ergocalciferol) .... Take 1 tab weekly  Patient Instructions: 1)  Please schedule a follow-up appointment in 4 months .  2)      Orders Added: 1)  Venipuncture [id number] 2)  TLB-BMP (Basic Metabolic Panel-BMET) [id number] 3)  TLB-A1C / Hgb A1C (Glycohemoglobin) [83036-A1C] 4)  Specimen Handling [99000] 5)  Est. Patient Level IV [id number]   Immunization History:  Influenza Immunization History:    Influenza:  historical (10/23/2009)   Immunization History:  Influenza Immunization History:    Influenza:  Historical (10/23/2009)

## 2010-03-24 NOTE — Progress Notes (Signed)
Summary: BS/BP readings   Phone Note Call from Patient   Summary of Call: BS/BP  Readings- 11/9- 130 127/72 11/10- 118 110/65 11/11- 131 121/69 11/12- 107 122/71 11/13- 107 117/63 11/14- 123 125/72 11/15- 109 111/64 11/16- 123 116/66 11/17- 132 122/67 11/18- 94 110/62 11/19- 113 115/70 11/20- 102 119/67 11/21- 116 127/73 11/22- 116 116/65 Initial call taken by: Allyn Kenner CMA,  January 13, 2010 10:09 AM  Follow-up for Phone Call        increase lantus to 55 u  call w/ CBGs in 2 weeks  [name]. [name] MD  January 13, 2010 4:48 PM   Additional Follow-up for Phone Call Additional follow up Details #1::        Pt is aware. [place]  January 13, 2010 4:50 PM     New/Updated Medications: LANTUS SOLOSTAR 100 UNIT/ML SOLN (INSULIN GLARGINE) 55  units at bedtime Prescriptions: LANTUS SOLOSTAR 100 UNIT/ML SOLN (INSULIN GLARGINE) 50  units at bedtime  #3 x 1   Entered by:   [place] by:   Alda Berthold. [name] MD   Signed by:   Allyn Kenner CMA on 01/13/2010   Method used:   Electronically to        [facility] ([facility])       [facility]       [address] [place]  [address]       Ph: [phone]       Fax: [phone]   [place]:   [phone]

## 2010-03-24 NOTE — Progress Notes (Signed)
Summary: bp, cbg readings, LMOM 4/1, 4/4  Phone Note Call from Patient Call back at [facility] Phone [phone]   Summary of Call: Fasting CBG: 267, 221, 215, 193, 197, 162, 189 BP readings: 159/87, 144/90, 128/79, 136/81, 145/78, 132/76 Dawson Bills  May 22, 2009 11:41 AM   Follow-up for Phone Call        increase Lantus from 10 to 14 units continue with same BP medications call w/  reading in  two weeks Follow-up by: [name]. [name] MD,  May 23, 2009 1:34 PM  Additional Follow-up for Phone Call Additional follow up Details #1::        Left message on machine (cell) for pt to return call Left message on home number to increase Lantus - call with ? Dawson Bills  May 23, 2009 3:10 PM  Left message on machine to return call with ? or concerns Dawson Bills  May 26, 2009 9:43 AM

## 2010-03-24 NOTE — Assessment & Plan Note (Signed)
Summary: lantus/swh  Nurse Visit  CC: lantus teaching Comments  - 7 units of lantus at bedtime  - advised to d/c amaryl  - watch for low blood sugar readings  - call with readings Dawson Bills  April 28, 2009 4:27 PM    Allergies: No Known Drug Allergies Prescriptions: PEN NEEDLES 31G X 6 MM MISC (INSULIN PEN NEEDLE) 1 daily  #1 box x 1   Entered by:   Dawson Bills   Authorized by:   Alda Berthold. Deadra Diggins MD   Signed by:   Dawson Bills on 04/28/2009   Method used:   Electronically to        [facility] (retail)       [address]       [place], [place]  [address]       Ph: [phone]       Fax: [phone]   RxID:   [id number] PEN NEEDLES 31G X 6 MM MISC (INSULIN PEN NEEDLE) 1 daily  #3 mo supply x 1   Entered by:   Dawson Bills   Authorized by:   Alda Berthold. Markee Remlinger MD   Signed by:   Dawson Bills on 04/28/2009   Method used:   Electronically to        [facility] (mail-order)       [facility]       [address]  [address]       Ph: [phone]       Fax: [phone]   RxID:   [id number] LANTUS SOLOSTAR 100 UNIT/ML SOLN (INSULIN GLARGINE) 7 units at bedtime  #3 mo supply x 1   Entered by:   Dawson Bills   Authorized by:   Alda Berthold. Troi Florendo MD   Signed by:   Dawson Bills on 04/28/2009   Method used:   Electronically to        [facility] (mail-order)       [facility]       [address]  [address]       Ph: [phone]       Fax: [phone]   RxID:   [id number] LANTUS SOLOSTAR 100 UNIT/ML SOLN (INSULIN GLARGINE) 7 units at bedtime  #qs x 1   Entered by:   Dawson Bills   Authorized by:   Alda Berthold. Raneem Mendolia MD   Signed by:   Dawson Bills on 04/28/2009   Method used:   Faxed to ...       [facility]       [address]       [place], [place]  [address]       Ph: [phone]       Fax:  [phone]   RxID:   [id number]

## 2010-03-24 NOTE — Medication Information (Signed)
Summary: [facility]  [facility]   Imported By: Edmonia [name] 09/16/2009 09:46:18  _____________________________________________________________________  External Attachment:    Type:   Image     Comment:   External Document

## 2010-03-24 NOTE — Medication Information (Signed)
Summary: [facility]  [facility]   Imported By: Edmonia [name] 03/12/2009 12:58:21  _____________________________________________________________________  External Attachment:    Type:   Image     Comment:   External Document

## 2010-03-24 NOTE — Medication Information (Signed)
Summary: [facility]  [facility]   Imported By: Edmonia [name] 06/17/2009 12:04:32  _____________________________________________________________________  External Attachment:    Type:   Image     Comment:   External Document

## 2010-03-24 NOTE — Letter (Signed)
Summary: [facility]  [facility]   Imported By: Edmonia  09/23/2009 12:39:53  _____________________________________________________________________  External Attachment:    Type:   Image     Comment:   External Document

## 2010-03-24 NOTE — Progress Notes (Signed)
Summary: BS/BP readingsp  Phone Note Call from Patient   Caller: Patient Summary of Call: BS/ BP Readings  2 weeks worth readings: Sat- BS- 131 BP- 122/70 Sun- BS- 163 BP- 120/69 Mon- BS- 121 BP 108/64 Tues- BS- 131 BP- 121/72 Wed- BS- 125 BP- 117/63 Thurs BS- 110 BP 107/73 Fri- BS- 141 BP- 98/54 Sat- BS- 108 BP- 116/76 Sun- BS- 112 BP- 124/68 Mon- BS- 124 BP- 123/71 Tues- BS- 147 BP- 130/79 Wed- BS 161 BP- 110/61 Thurs- BS- 129 BP- 115/63 Fri- BS 118 BP- 112/61 Sat- BS- 125 BP 119/68 Initial call taken by: Allyn Kenner CMA,  October 13, 2009 11:20 AM  Follow-up for Phone Call        reading is satisfactory. No change. Next office visit October 2011 Follow-up by: Alda Berthold. Teal Raben MD,  October 14, 2009 2:10 PM  Additional Follow-up for Phone Call Additional follow up Details #1::        I spoke with pt she is aware. [place]  October 14, 2009 2:51 PM     Prescriptions: LANTUS SOLOSTAR 100 UNIT/ML SOLN (INSULIN GLARGINE) 35  units at bedtime  #3 x 0   Entered by:   [place] by:   Alda Berthold. Yashica Sterbenz MD   Signed by:   Allyn Kenner CMA on 10/13/2009   Method used:   Electronically to        [facility] ([facility]order)       [facility]       [address] [place]  [address]       Ph: [phone]       Fax: [phone]   RxID:   [id number] LANTUS SOLOSTAR 100 UNIT/ML SOLN (INSULIN GLARGINE) 35  units at bedtime  #1 x 2   Entered by:   Allyn Kenner CMA   Authorized by:   Alda Berthold. Cason Luffman MD   Signed by:   Allyn Kenner CMA on 10/13/2009   Method used:   Electronically to        [facility] (retail)       [address]       [place], [place]  [address]       Ph: [phone]       Fax: [phone]   RxID:   [id number]  cancelled rx at [place]. [place]  October 13, 2009 11:16 AM

## 2010-03-24 NOTE — Progress Notes (Signed)
Summary: BP and BS Readings  Phone Note Call from Patient Call back at [facility] Phone [phone]   Caller: Patient Summary of Call: Blood Pressue and Blood Sugar Results:  7/8       118/71       125 7/9       119/65       115 7/10     118/62        151 7/11     Patient then left town and forgot to take insulin for one day.   126/79      177 7/12    127/67        129 7/13    131/74        150 7/14    118/66        173 ate late 7/15    124/70        153 ate late 7/16    127/74        136 7/17    13/70          129 7/18    128/73        135 7/19    126/73        124  7/20    128/75        122 7/21    138/76        141 ate late 7/22    113/79        119 Initial call taken by: Elna Breslow,  September 12, 2009 11:54 AM  Follow-up for Phone Call        increase lantus to 35 call w/ CBGs in 3 weeks  Follow-up by: [name]. [name] MD,  September 16, 2009 8:43 AM  Additional Follow-up for Phone Call Additional follow up Details #1::        left message on machine ..............Marland KitchenMalachi Bonds CMA  September 16, 2009 1:58 PM     Additional Follow-up for Phone Call Additional follow up Details #2::    Patient is aware to change lantus. Follow-up by: Elna Breslow,  September 16, 2009 3:23 PM  New/Updated Medications: LANTUS SOLOSTAR 100 UNIT/ML SOLN (INSULIN GLARGINE) 35  units at bedtime

## 2010-03-24 NOTE — Progress Notes (Signed)
Summary: readings  Phone Note Call from Patient   Summary of Call: BLOOD SUGAR READINGS ON LANTUS 22 UNITS: 103, 139, 122, 128, 124, 120 BLOOD PRESSURE READINGS: 117/69, 104/63, 99/61, 115/60  Angel Green  Jun 25, 2009 3:36 PM   Follow-up for Phone Call        no change, continue Lantus 22 units due for a office visit by June 2011 Follow-up by: Alda Berthold. Arthur Speagle MD,  Jun 26, 2009 10:30 AM

## 2010-03-24 NOTE — Medication Information (Signed)
Summary: Approval for Glimepiride/[facility]  Approval for Glimepiride/[facility]   Imported By: Edmonia [name] 05/02/2009 08:53:53  _____________________________________________________________________  External Attachment:    Type:   Image     Comment:   External Document

## 2010-03-24 NOTE — Progress Notes (Signed)
Summary: blood sugar readings  Phone Note Call from Patient Call back at Home Phone [phone]   Summary of Call: BLOOD SUGAR READINGS: 155, 172, 176, 164, 136, 138, 157, 173 (pt ate late) BLOOD PRESSURE READINGS: 131/70, 134/78, 126/72, 125/71, 132/68 On Lantus 14 units, feels good Angel Green  June 02, 2009 1:06 PM   Follow-up for Phone Call        increase lantus to 18 u call w/ readings in 2 weeks call if CBGs < 100 in AM (will have to decrease to lantus 16u) [name] E. Jaree Trinka MD  June 02, 2009 1:43 PM   discussed with pt Angel Green  June 02, 2009 1:55 PM     New/Updated Medications: LANTUS SOLOSTAR 100 UNIT/ML SOLN (INSULIN GLARGINE) 18 units at bedtime

## 2010-03-24 NOTE — Progress Notes (Signed)
Summary: Samples  Phone Note Call from Patient Call back at Home Phone [phone] Call back at Work Phone [phone] Message from:  Patient  Caller: Patient Reason for Call: Refill Medication, Talk to Doctor Summary of Call: Patient was given samples of Azor 10-40mg  and is now out and would like some more please.  Initial call taken by: Elna Breslow,  June 10, 2009 9:31 AM  Follow-up for Phone Call        pt aware samples up front for pick up [facility]  June 10, 2009 11:46 AM

## 2010-03-24 NOTE — Progress Notes (Signed)
Summary: clarify pen size  Phone Note Refill Request   Refills Requested: Medication #1:  PEN NEEDLES 31G X 6 MM MISC 1 daily REFILL SENT TO [facility] - NEED CLARIFICATION - clarify pen size BDUF-III short needles 31G X 5/16 (46mm) or BD UF-III MINI NEEDLES 31g x 3/16  Initial call taken by: Arbie [place],  August 18, 2009 10:03 AM  Follow-up for Phone Call        pharmacy verbally informed to fiilled rx for what has  been previous disp to pt. 86mm.Marland KitchenMarland KitchenMarland KitchenFelecia Deloach CMA  August 18, 2009 1:00 PM

## 2010-03-24 NOTE — Progress Notes (Signed)
Summary: cbg readings  Phone Note Call from Patient Call back at Home Phone [phone]   Summary of Call: FASTING CBG ON LANTUS 18U: 150, 129, 154, 164, 138, 125, 159, 165, 112, 122, 160, 200 (ate late the night before), 219 (ate late the night before), 134, 128  BP READINGS: 131/74, 129/80, 125/76, 140/74, 124/64, 126/72, 128/74, 128/72, 129/73, 119/76, 116/64, 117/69, 122/73, 116/62  Initial call taken by: Dawson Bills,  June 17, 2009 4:33 PM  Follow-up for Phone Call        increase lantus to 22 u, watch for persistent am cbgs of < [place]. Annette Bertelson MD  June 18, 2009 8:12 AM   Left message on machine with instructions Dawson Bills  June 18, 2009 11:36 AM     New/Updated Medications: LANTUS SOLOSTAR 100 UNIT/ML SOLN (INSULIN GLARGINE) 22  units at bedtime

## 2010-03-24 NOTE — Progress Notes (Signed)
Summary: blood sugar readings  Phone Note Call from Patient Call back at Home Phone [phone]   Details for Reason:  - blood pressure readings  - blood sugar readings  - requesting samples of azor 10/40 Summary of Call: BP: 100/55, 122/69, 118/64, 129/77, 113/62, 120/65, 117/64, 107/60, 134/80, 113/66 BLOOD SUGAR: (fasting) 125, 132, 105, 105, 115, 123, 106, 117, 129, 110 ...........Marland KitchenDawson Bills  Jul 09, 2009 11:52 AM    Follow-up for Phone Call        BP and CBGs seem  okay Okay to provide samples 4-6 weeks worth if available Follow-up by: [facility] E. Maille Halliwell MD,  Jul 09, 2009 3:49 PM

## 2010-03-24 NOTE — Medication Information (Signed)
Summary: Approval for Lantus/[facility]  Approval for Lantus/[facility]   Imported By: Edmonia [name] 09/01/2009 11:45:32  _____________________________________________________________________  External Attachment:    Type:   Image     Comment:   External Document

## 2010-03-24 NOTE — Progress Notes (Signed)
Summary: left msg to call blood sugars  Phone Note Call from Patient Call back at Work Phone [phone]   Caller: Patient Summary of Call: pt called left msg  update on BS; doing well on her insulin.  Blood sugars running in the 150"s. Left msg for pt to call with her week of blood sugar readings .Verdie Mosher  May 07, 2009 3:38 PM   Initial call taken by: Verdie Mosher,  May 07, 2009 3:38 PM  Follow-up for Phone Call        increase lantus from 7 to 10u at night call w/ CBGs in 1 week Follow-up by: [facility] E. [name] MD,  May 10, 2009 7:03 PM  Additional Follow-up for Phone Call Additional follow up Details #1::        pt aware Additional Follow-up by: Dawson Bills,  May 14, 2009 3:41 PM    New/Updated Medications: LANTUS SOLOSTAR 100 UNIT/ML SOLN (INSULIN GLARGINE) 10 units at bedtime

## 2010-03-24 NOTE — Progress Notes (Signed)
Summary: labs results  Phone Note Outgoing Call   Summary of Call: creatinine is slightly above baseline, we'll recheck and return to the office advise patient: -Pap Smear normal -Vitamin D low, call ergocalciferol  50,000 units weekly for 3 months. After that, she can restart over-the-counter vitamin D -DM improving, not at goal. Increase Lantus from 22 units to 26 units. Watch for low sugars and call with CBGs in 2 weeks -office visit in 4 months as planned [place] E. Salia Cangemi MD  August 14, 2009 10:25 AM   Follow-up for Phone Call        DISCUSS WITH PATIENT , letter mailed, rx sent to pharmacy..............Marland KitchenFelecia Deloach CMA  August 14, 2009 2:42 PM     New/Updated Medications: ERGOCALCIFEROL 50000 UNIT CAPS (ERGOCALCIFEROL) Take 1 tab weekly Prescriptions: ERGOCALCIFEROL 50000 UNIT CAPS (ERGOCALCIFEROL) Take 1 tab weekly  #4 x 3   Entered by:   Rolla Flatten CMA   Authorized by:   Alda Berthold. Erasmus Bistline MD   Signed by:   Rolla Flatten CMA on 08/14/2009   Method used:   F[facility]ed to ...       [facility]       [address]       [place], [place]  [address]       Ph: [phone]       Fax: [phone]   RxID:   [id number]

## 2010-03-24 NOTE — Assessment & Plan Note (Signed)
Summary: cpx/kdc   Vital Signs:  Patient profile:   70 year old female Height:      64.5 inches Weight:      232 pounds Temp:     98.3 degrees F oral Pulse rate:   80 / minute Resp:     18 per minute BP sitting:   122 / 78  (left arm)  Vitals Entered By: Rolla Flatten CMA (August 11, 2009 1:20 PM) CC: yearly Comments --not fasting --refills --discuss on going swelling in both legs --rash on right arm REVIEWED MED LIST, PATIENT AGREED DOSE AND INSTRUCTION CORRECT    History of Present Illness: yearly examination, chart reviewed; feels "great" needs  refills  --LE edema, admits to traveling a lot ; ankles and feet, on-off ; usually L>R   --rash on right arm , started 3 weeks ago, getting better , no pruritus   HYPERTENSION -- ambulatory BPs WNL  DIABETES -- ambulatory CBGs "great", in the 90s or low 100s  HYPERLIPIDEMIA -- good medication compliance     Allergies (verified): No Known Drug Allergies  Past History:  Past Medical History: HYPERTENSION  DIABETES MELLITUS, TYPE II   HYPERLIPIDEMIA  OSTEOARTHROSIS, GENERALIZED, MULTIPLE SITES   ANKLE PAIN, CHRONIC  RHINITIS, ALLERGIC NOS    Past Surgical History: Reviewed history from 11/14/2007 and no changes required. Cholecystectomy Hysterectomy, partial in her 43s  Family History: Reviewed history from 11/14/2007 and no changes required. MI--no colon ca--no breast ca--no  Social History: Reviewed history from 04/23/2009 and no changes required. Retired Married, husband is a Company secretary  2 children Never Smoked Alcohol use-no Drug use-no Regular exercise-yes: walking   Review of Systems CV:  Denies chest pain or discomfort and shortness of breath with exertion; (-) orthopnea . Resp:  Denies cough and wheezing. GI:  Denies bloody stools, diarrhea, and nausea. GU:  no vag d/c or bleed  does SBE , normal . Psych:  Denies anxiety and depression.  Physical Exam  General:  alert, well-developed, and  overweight-appearing.   Neck:  no thyromegaly.   Breasts:  No mass, nodules, thickening, tenderness, bulging, retraction, inflamation, nipple discharge or skin changes noted.  no axillary lymphadenopathy Lungs:  normal respiratory effort, no intercostal retractions, no accessory muscle use, and normal breath sounds.   Heart:  normal rate, regular rhythm, and no murmur.   Abdomen:  soft, non-tender, no distention, no masses, no guarding, and no rigidity.   Genitalia:  normal introitus, no external lesions, no vaginal discharge, mucosa pink and moist, no vaginal atrophy, and no friaility or hemorrhage.   Bimanual exam, absent uterus, no masses Extremities:  trace bilateral edema around the ankles, symmetric Psych:  not anxious appearing and not depressed appearing.     Impression & Recommendations:  Problem # 1:  EDEMA ([id number]) trace pitting edema, she continued to gain weight. Recommend low salt diet, elevate legs twice a day for 30 minutes, isometric exercises if traveling Her updated medication list for this problem includes:    Furosemide 20 Mg Tabs (Furosemide) .Marland Kitchen... Take 2  tablets  daily  Problem # 2:  HEALTH SCREENING (ICD-V70.0) Td--9-09 pneumonia shot-- 9-09  information about  shingles shot provided   last mammogram 10-2008, abnormal, follow up by a right ultrasound which was okay. Next B mammogram 10-2009 PAPs, states had several (-) ones after hysterectomy, last PAP aprox 2006 PAP today, repeat in 3 years if so desire  Cscope 02-2002 Dr Collene Mares: (-), per report : repeat in 10 years  last dexa? ever done?----ordering a DEXA, check vitamin D   on HRT x years, d/c HRT     Orders: Radiology Referral (Radiology)  Problem # 3:  HYPERTENSION (ICD-401.9) at goal  Her updated medication list for this problem includes:    Furosemide 20 Mg Tabs (Furosemide) .Marland Kitchen... Take 2  tablets  daily    Benicar 20 Mg Tabs (Olmesartan medoxomil) ..... Hold    Azor 10-40 Mg Tabs  (Amlodipine-olmesartan) .Marland Kitchen... 1 a day  Orders: Venipuncture [id number]) TLB-BMP (Basic Metabolic Panel-BMET) (99991111) TLB-CBC Platelet - w/Differential (85025-CBCD) T-Vitamin D (25-Hydroxy) [id number])  BP today: 122/78 Prior BP: 160/80 (05/14/2009)  Prior 10 Yr Risk Heart Disease: 13 % (04/17/2007)  Labs Reviewed: K+: 3.8 (01/14/2009) Creat: : 1.3 (01/14/2009)   Chol: 148 (04/23/2009)   HDL: 83.10 (04/23/2009)   LDL: 43 (04/23/2009)   TG: 111.0 (04/23/2009)  Problem # 4:  HYPERLIPIDEMIA (ICD-272.4) at goal  Her updated medication list for this problem includes:    Lipitor 10 Mg Tabs (Atorvastatin calcium) .Marland Kitchen... 1 by mouth qd  Orders: TLB-ALT (SGPT) (84460-ALT) TLB-AST (SGOT) (84450-SGOT)  Labs Reviewed: SGOT: 20 (10/31/2008)   SGPT: 17 (10/31/2008)  Lipid Goals: Chol Goal: 200 (04/17/2007)   HDL Goal: 40 (04/17/2007)   LDL Goal: 100 (04/17/2007)   TG Goal: 150 (04/17/2007)  Prior 10 Yr Risk Heart Disease: 13 % (04/17/2007)   HDL:83.10 (04/23/2009), 68.2 (04/17/2007)  LDL:43 (04/23/2009), 48 (04/17/2007)  Chol:148 (04/23/2009), 141 (04/17/2007)  Trig:111.0 (04/23/2009), 123 (04/17/2007)  Problem # 5:  DIABETES MELLITUS, TYPE II (ICD-250.00)  since the last office visit, she started lantus, doing well , labs ambulatory CBGs in AM in the 90s, no low sugar symptoms  Her updated medication list for this problem includes:    Lantus Solostar 100 Unit/ml Soln (Insulin glargine) .Marland Kitchen... 22  units at bedtime    Januvia 100 Mg Tabs (Sitagliptin phosphate) .Marland Kitchen... 1 by mouth once daily    Benicar 20 Mg Tabs (Olmesartan medoxomil) ..... Hold    Azor 10-40 Mg Tabs (Amlodipine-olmesartan) .Marland Kitchen... 1 a day    Baby Aspirin 81 Mg Chew (Aspirin)  Orders: TLB-A1C / Hgb A1C (Glycohemoglobin) (83036-A1C)  Complete Medication List: 1)  Lantus Solostar 100 Unit/ml Soln (Insulin glargine) .... 22  units at bedtime 2)  Januvia 100 Mg Tabs (Sitagliptin phosphate) .Marland Kitchen.. 1 by mouth once daily 3)   Lipitor 10 Mg Tabs (Atorvastatin calcium) .Marland Kitchen.. 1 by mouth qd 4)  Furosemide 20 Mg Tabs (Furosemide) .... Take 2  tablets  daily 5)  Benicar 20 Mg Tabs (Olmesartan medoxomil) .... Hold 6)  Azor 10-40 Mg Tabs (Amlodipine-olmesartan) .Marland Kitchen.. 1 a day 7)  Astepro 137 Mcg/spray Soln (Azelastine hcl) .... 2 puffs on each side of the nose  two times a day 8)  Flonase 50 Mcg/act Susp (Fluticasone propionate) .... 2 puff once daily 9)  Prilosec 20 Mg Cpdr (Omeprazole) .... Take 1 tab once daily 10)  Celebrex 200 Mg Caps (Celecoxib) .Marland Kitchen.. 1 by mouth once daily 11)  Allegra 180 Mg Tabs (Fexofenadine hcl) .Marland Kitchen.. 1 by mouth once daily 12)  Baby Aspirin 81 Mg Chew (Aspirin) 13)  Loratadine 10 Mg Tabs (Loratadine) .Marland Kitchen.. 1 by mouth daily 14)  Pen Needles 31g X 6 Mm Misc (Insulin pen needle) .Marland Kitchen.. 1 daily   Patient Instructions: 1)  Please schedule a follow-up appointment in 4 months .  Prescriptions: FUROSEMIDE 20 MG  TABS (FUROSEMIDE) Take 2  tablets  daily  #180 x 3   Entered by:  Felecia Deloach CMA   Authorized by:   Alda Berthold. [name] MD   Signed by:   Rolla Flatten CMA on 08/11/2009   Method used:   Faxed to ...       [facility] [address] Probation officer)       [facility]       [address] [place]  [address]       Ph: [phone]       Fax: [phone]   RxID:   [id number] PEN NEEDLES 31G X 6 MM MISC (INSULIN PEN NEEDLE) 1 daily  #3 month x 3   Entered by:   Rolla Flatten CMA   Authorized by:   Alda Berthold. [name] MD   Signed by:   Rolla Flatten CMA on 08/11/2009   Method used:   Faxed to ...       [facility] Probation officer)       [facility]       [address] [place]  [address]       Ph: [phone]       Fax: [phone]   RxID:   [id number] LANTUS SOLOSTAR 100 UNIT/ML SOLN (INSULIN GLARGINE) 22  units at bedtime  #55month x 3   Entered by:   Rolla Flatten CMA   Authorized by:   Alda Berthold. [name] MD   Signed by:    Rolla Flatten CMA on 08/11/2009   Method used:   Faxed to ...       [facility] Probation officer)       [facility]       [address] [place]  [address]       Ph: [phone]       Fax: [phone]   RxID:   [id number] ALLEGRA 180 MG TABS (FEXOFENADINE HCL) 1 by mouth once daily  #90 x 3   Entered by:   Rolla Flatten CMA   Authorized by:   Alda Berthold. [name] MD   Signed by:   Rolla Flatten CMA on 08/11/2009   Method used:   Faxed to ...       [facility] Probation officer)       [facility]       [address] [place]  [address]       Ph: [phone]       Fax: [phone]   RxID:   [id number] CELEBREX 200 MG CAPS (CELECOXIB) 1 by mouth once daily  #90 x 3   Entered by:   Rolla Flatten CMA   Authorized by:   Alda Berthold. [name] MD   Signed by:   Rolla Flatten CMA on 08/11/2009   Method used:   Faxed to ...       [facility] Probation officer)       [facility]       [address] [place]  [address]       Ph: [phone]       Fax: [phone]   RxID:   [id number] LIPITOR 10 MG  TABS (ATORVASTATIN CALCIUM) 1 by mouth qd  #90 x 3   Entered by:   Rolla Flatten CMA   Authorized by:   Alda Berthold. [name] MD   Signed by:  Felecia Deloach CMA on 08/11/2009   Method used:   Faxed to ...       [facility] Probation officer)       [facility]       [address] [place]  [address]       Ph: [phone]       Fax: [phone]   RxID:   [id number] JANUVIA 100 MG TABS (SITAGLIPTIN PHOSPHATE) 1 by mouth once daily  #90 x 3   Entered by:   Rolla Flatten CMA   Authorized by:   Alda Berthold. [name] MD   Signed by:   Rolla Flatten CMA on 08/11/2009   Method used:   Faxed to ...       [facility] Probation officer)       [facility]       [address] [place]  [address]        Ph: [phone]       Fax: [phone]   RxID:   [id number] AZOR 10-40 MG TABS (AMLODIPINE-OLMESARTAN) 1 a day  #90 x 3   Entered by:   Rolla Flatten CMA   Authorized by:   Alda Berthold. [name] MD   Signed by:   Rolla Flatten CMA on 08/11/2009   Method used:   Faxed to ...       [facility] Probation officer)       [facility]       [address] [place]  [address]       Ph: [phone]       Fax: [phone]   RxID:   [id number] LORATADINE 10 MG TABS (LORATADINE) 1 by mouth daily  #30 x 11   Entered by:   Rolla Flatten CMA   Authorized by:   Alda Berthold. [name] MD   Signed by:   Rolla Flatten CMA on 08/11/2009   Method used:   Faxed to ...       [facility]       [address]       [place], [place]  [address]       Ph: [phone]       Fax: [phone]   RxID:   [id number]

## 2010-03-24 NOTE — Progress Notes (Signed)
Summary: blood pressure and blood sugar readings  Phone Note Call from Patient Call back at Home Phone [phone]   Caller: Patient Summary of Call: patient says she has a list of Blood Pressure and Blood sugar readings that she needs to give to [facility] Initial call taken by: Berneta Sages,  December 30, 2009 11:39 AM  Follow-up for Phone Call        BP/BS Readings 10/26- 113- 121/67 10/27- 163- 120/69 10/28- 132 - 121/63 10/29- 130- 120/62 10/30- 126- 119/60 10/31- 161- 124/72 11/1- 144- 122/69 11/2- 140- 116/74 11/3- 129- 124/72 11/4- 133- 124/69 11/5- 131- 116/66 11/6- 108- 117/63 11/7- 142 - 118/74 11/8- 149- 133/79  Pt states all her BS are fasting. Follow-up by: Allyn Kenner CMA,  December 30, 2009 11:48 AM  Additional Follow-up for Phone Call Additional follow up Details #1::        --BP well controlled --room for improvement on diabetes, increase Lantus to 50 units daily. Call with morning CBGs in 2 weeks. Additional Follow-up by: Alda Berthold. [name] MD,  December 30, 2009 4:55 PM    Additional Follow-up for Phone Call Additional follow up Details #2::    Pt is aware. [place]  December 30, 2009 4:59 PM   New/Updated Medications: LANTUS SOLOSTAR 100 UNIT/ML SOLN (INSULIN GLARGINE) 50  units at bedtime

## 2010-03-24 NOTE — Progress Notes (Signed)
[facility]  Phone Note Refill Request Message from:  Patient  Refills Requested: Medication #1:  PREMARIN 1.25 MG  TABS Take one tablet daily need [facility] to [facility] [phone]  Initial call taken by: Verdie Mosher,  March 10, 2009 3:49 PM Caller: Patient Reason for Call: Refill Medication  Follow-up for Phone Call        rx faxed pt informed .Verdie Mosher  March 10, 2009 4:47 PM     Prescriptions: PREMARIN 1.25 MG  TABS (ESTROGENS CONJUGATED) Take one tablet daily  #90 x 0   Entered by:   Verdie Mosher   Authorized by:   Alda Berthold. [name] MD   Signed by:   Verdie Mosher on 03/10/2009   Method used:   Printed then faxed to ...       [facility] [address] Probation officer)       [facility]       [address] [place]  [address]       Ph: [phone]       Fax: [phone]   RxID:   [id number]

## 2010-03-24 NOTE — Progress Notes (Signed)
Summary: BP and BS Results  Phone Note Call from Patient Call back at [facility] Phone [phone]   Caller: Patient Details for Reason: BP and BS results Summary of Call:              Blood Sugar:                             Blood Pressure: June 21   142                                             111/62                 126                                             115/69                 113                                             96/57                 122                                             122/71                 122                                             123/72                 142                                             124/78                 132                                             134/75                 115                                             106/68                 113  133/77                 123                                             116/69                 123                                             117/70 July 5th   153                                118/68 July 6th   132                               113/64  Patient is having some swelling in her feet this past week. The only change she has had was starting a vitamin D supplement, she takes 50,000 units once a week. She also 2 lasix a day. She is going out of town this saturday.   Initial call taken by: Elna Breslow,  August 28, 2009 10:50 AM  Follow-up for Phone Call        BP well controlled sugars could  be better, increase Lantus from 26 to 30u call w/ CBGs in 2 weeks as far as the swelling, watch her salt intake, elevate her legs twice a day for 30 minutes. Okay to take an extra Lasix one or twice a week. [name]. [name] MD  August 28, 2009 2:02 PM   Additional Follow-up for Phone Call Additional follow up Details #1::        I spoke with pt and discussed all of the above. Pt understood. Allyn Kenner CMA  August 28, 2009 2:17 PM       New/Updated Medications: LANTUS SOLOSTAR 100 UNIT/ML SOLN (INSULIN GLARGINE) 30  units at bedtime

## 2010-03-24 NOTE — Progress Notes (Signed)
Summary: loratadine refill   Phone Note Refill Request Call back at Home Phone [phone] Message from:  Patient on April 01, 2009 1:58 PM  Refills Requested: Medication #1:  LORATADINE 10 MG TABS 1 by mouth daily. [facility] [address]   Method Requested: Telephone to Pharmacy Initial call taken by: Alvin Critchley,  April 01, 2009 1:58 PM    Prescriptions: LORATADINE 10 MG TABS (LORATADINE) 1 by mouth daily  #30 x 3   Entered by:   Malachi Bonds   Authorized by:   Alda Berthold. [name] MD   Signed by:   Malachi Bonds on 04/01/2009   Method used:   Electronically to        [facility] (retail)       [address]       [place], [place]  [address]       Ph: [phone]       Fax: [phone]   RxID:   [id number]

## 2010-03-24 NOTE — Assessment & Plan Note (Signed)
Summary: bp check/kdc  Nurse Visit   Vital Signs:  Patient profile:   70 year old female Height:      64.5 inches Weight:      230 pounds BMI:     39.01 Pulse rate:   86 / minute BP sitting:   160 / 80  Vitals Entered By: Dawson Bills (May 14, 2009 3:48 PM)  Impression & Recommendations:  Problem # 1:  HYPERTENSION (ICD-401.9)  BP slightly elevated, increase azor to  10/40 nurse visit  for BP check in one month watch for edema  Her updated medication list for this problem includes:    Furosemide 20 Mg Tabs (Furosemide) .Marland Kitchen... Take 2  tablets  daily    Benicar 20 Mg Tabs (Olmesartan medoxomil) ..... Hold    Azor 10-40 Mg Tabs (Amlodipine-olmesartan) .Marland Kitchen... 1 a day  BP today: 160/80 Prior BP: 142/96 (04/23/2009)  Prior 10 Yr Risk Heart Disease: 13 % (04/17/2007)  Labs Reviewed: K+: 3.8 (01/14/2009) Creat: : 1.3 (01/14/2009)   Chol: 148 (04/23/2009)   HDL: 83.10 (04/23/2009)   LDL: 43 (04/23/2009)   TG: 111.0 (04/23/2009)   Discussed with pt -- samples of Azor 10/40 up front for pt to pick up [facility]  May 15, 2009 11:16 AM  Orders: No Charge Patient Arrived (NCPA0) (NCPA0)  Complete Medication List: 1)  Januvia 100 Mg Tabs (Sitagliptin phosphate) .Marland Kitchen.. 1 by mouth once daily 2)  Lipitor 10 Mg Tabs (Atorvastatin calcium) .Marland Kitchen.. 1 by mouth qd 3)  Furosemide 20 Mg Tabs (Furosemide) .... Take 2  tablets  daily 4)  Benicar 20 Mg Tabs (Olmesartan medoxomil) .... Hold 5)  Azor 10-40 Mg Tabs (Amlodipine-olmesartan) .Marland Kitchen.. 1 a day 6)  Premarin 1.25 Mg Tabs (Estrogens conjugated) .... Take one tablet daily 7)  Astepro 137 Mcg/spray Soln (Azelastine hcl) .... 2 puffs on each side of the nose  two times a day 8)  Flonase 50 Mcg/act Susp (Fluticasone propionate) .... 2 puff once daily 9)  Omeprazole 20 Mg Cpdr (Omeprazole) .Marland Kitchen.. 1 by mouth once daily 10)  Celebrex 200 Mg Caps (Celecoxib) .Marland Kitchen.. 1 by mouth once daily 11)  Allegra 180 Mg Tabs (Fexofenadine hcl) .Marland Kitchen.. 1 by mouth once  daily 12)  Baby Aspirin 81 Mg Chew (Aspirin) 13)  Loratadine 10 Mg Tabs (Loratadine) .Marland Kitchen.. 1 by mouth daily 14)  Lantus Solostar 100 Unit/ml Soln (Insulin glargine) .Marland Kitchen.. 10 units at bedtime 15)  Pen Needles 31g X 6 Mm Misc (Insulin pen needle) .Marland Kitchen.. 1 daily  CC: bp check Comments  - holding benicar & on Azor 5/20 since 04/23/09 Dawson Bills  May 14, 2009 3:48 PM    Current Medications (verified): 1)  Januvia 100 Mg Tabs (Sitagliptin Phosphate) .Marland Kitchen.. 1 By Mouth Once Daily 2)  Lipitor 10 Mg  Tabs (Atorvastatin Calcium) .Marland Kitchen.. 1 By Mouth Qd 3)  Furosemide 20 Mg  Tabs (Furosemide) .... Take 2  Tablets  Daily 4)  Benicar 20 Mg Tabs (Olmesartan Medoxomil) .... Hold 5)  Azor 5-40 Mg Tabs (Amlodipine-Olmesartan) .Marland Kitchen.. 1 A Day 6)  Premarin 1.25 Mg  Tabs (Estrogens Conjugated) .... Take One Tablet Daily 7)  Astepro 137 Mcg/spray  Soln (Azelastine Hcl) .... 2 Puffs On Each Side of The Nose  Two Times A Day 8)  Flonase 50 Mcg/act Susp (Fluticasone Propionate) .... 2 Puff Once Daily 9)  Omeprazole 20 Mg Cpdr (Omeprazole) .Marland Kitchen.. 1 By Mouth Once Daily 10)  Celebrex 200 Mg Caps (Celecoxib) .Marland Kitchen.. 1 By Mouth Once Daily 11)  Allegra 180 Mg Tabs (Fexofenadine Hcl) .Marland Kitchen.. 1 By Mouth Once Daily 12)  Baby Aspirin 81 Mg  Chew (Aspirin) 13)  Loratadine 10 Mg Tabs (Loratadine) .Marland Kitchen.. 1 By Mouth Daily 14)  Lantus Solostar 100 Unit/ml Soln (Insulin Glargine) .... 7 Units At Bedtime 15)  Pen Needles 31g X 6 Mm Misc (Insulin Pen Needle) .Marland Kitchen.. 1 Daily  Allergies: No Known Drug Allergies  Orders Added: 1)  No Charge Patient Arrived (NCPA0) [NCPA0]

## 2010-03-24 NOTE — Medication Information (Signed)
Summary: [facility]  [facility]   Imported By: Edmonia [name] 07/07/2009 08:11:11  _____________________________________________________________________  External Attachment:    Type:   Image     Comment:   External Document

## 2010-03-24 NOTE — Assessment & Plan Note (Signed)
Summary: 4 mth fu/ns/kdc   Vital Signs:  Patient profile:   70 year old female Height:      64.5 inches Weight:      230.6 pounds BMI:     39.11 Pulse rate:   70 / minute BP sitting:   142 / 96  Vitals Entered By: Dawson Bills (April 23, 2009 9:03 AM) CC: rov Is Patient Diabetic? Yes Comments  - pt is requesting that we rx prilosec, she does not go back to see Dr. Collene Mares anymore  - pt states that her blood sugar has been fluctuating since she started on the januvia about 5 months ago....avg blood sugar 160's  - FBS today was 169  - janumet was d/c'd 8/10 due to nausea Dawson Bills  April 23, 2009 9:16 AM    History of Present Illness: feels great  GERD:  pt is requesting that we rx prilosec, she does not go back to see Dr. Collene Mares anymore symptoms well controlled   DM: pt states that her blood sugar has been fluctuating since she started on the januvia about 5 months ago and d/c glucophage d/t nausea  avg blood sugar 160's diet described as healthy   HYPERTENSION -- ambulatory BPs elevated at times to the 170s-180s  OSTEOARTHROSIS-- asx at this point , active, walks        Current Medications (verified): 1)  Amaryl 2 Mg Tabs (Glimepiride) .Marland Kitchen.. 1 Tablet Two Times A Day 2)  Januvia 100 Mg Tabs (Sitagliptin Phosphate) .Marland Kitchen.. 1 By Mouth Once Daily 3)  Lipitor 10 Mg  Tabs (Atorvastatin Calcium) .Marland Kitchen.. 1 By Mouth Qd 4)  Furosemide 20 Mg  Tabs (Furosemide) .... Take 2  Tablets  Daily 5)  Benicar 20 Mg Tabs (Olmesartan Medoxomil) .... Take 2 Tablet Daily 6)  Premarin 1.25 Mg  Tabs (Estrogens Conjugated) .... Take One Tablet Daily 7)  Astepro 137 Mcg/spray  Soln (Azelastine Hcl) .... 2 Puffs On Each Side of The Nose  Two Times A Day 8)  Flonase 50 Mcg/act Susp (Fluticasone Propionate) .... 2 Puff Once Daily 9)  Omeprazole 20 Mg Cpdr (Omeprazole) .Marland Kitchen.. 1 By Mouth Once Daily 10)  Celebrex 200 Mg Caps (Celecoxib) .Marland Kitchen.. 1 By Mouth Once Daily 11)  Allegra 180 Mg Tabs (Fexofenadine Hcl)  .Marland Kitchen.. 1 By Mouth Once Daily 12)  Baby Aspirin 81 Mg  Chew (Aspirin) 13)  Loratadine 10 Mg Tabs (Loratadine) .Marland Kitchen.. 1 By Mouth Daily  Allergies (verified): No Known Drug Allergies  Past History:  Past Medical History: HYPERTENSION  DIABETES MELLITUS, TYPE II   HYPERLIPIDEMIA  OSTEOARTHROSIS, GENERALIZED, MULTIPLE SITES   ANKLE PAIN, CHRONIC  RHINITIS, ALLERGIC NOS    Past Surgical History: Reviewed history from 11/14/2007 and no changes required. Cholecystectomy Hysterectomy, partial in her 17s  Social History: Retired Married, husband is a Company secretary  2 children Never Smoked Alcohol use-no Drug use-no Regular exercise-yes: walking   Review of Systems CV:  Denies chest pain or discomfort and swelling of feet. Resp:  Denies cough and shortness of breath. GI:  Denies bloody stools, diarrhea, nausea, and vomiting. Psych:  Denies anxiety and depression.  Physical Exam  General:  alert and well-developed.   Lungs:  normal respiratory effort, no intercostal retractions, no accessory muscle use, and normal breath sounds.   Heart:  normal rate, regular rhythm, no murmur, and no gallop.   Extremities:  no edema pretibialy    Psych:  Oriented X3, memory intact for recent and remote, normally interactive, good  eye contact, not anxious appearing, and not depressed appearing.     Impression & Recommendations:  Problem # 1:  HYPERTENSION (ICD-401.9) not well controlled,  switch benicar to azor , 4-week sample provided BP check in 3 weeks, no charge Her updated medication list for this problem includes:    Furosemide 20 Mg Tabs (Furosemide) .Marland Kitchen... Take 2  tablets  daily    Benicar 20 Mg Tabs (Olmesartan medoxomil) ..... Hold    Azor 5-40 Mg Tabs (Amlodipine-olmesartan) .Marland Kitchen... 1 a day  BP today: 142/96 Prior BP: 120/90 (01/14/2009)  Prior 10 Yr Risk Heart Disease: 13 % (04/17/2007)  Labs Reviewed: K+: 3.8 (01/14/2009) Creat: : 1.3 (01/14/2009)   Chol: 141 (04/17/2007)   HDL:  68.2 (04/17/2007)   LDL: 48 (04/17/2007)   TG: 123 (04/17/2007)  Problem # 2:  HYPERLIPIDEMIA (ICD-272.4) due for labs Her updated medication list for this problem includes:    Lipitor 10 Mg Tabs (Atorvastatin calcium) .Marland Kitchen... 1 by mouth qd  Labs Reviewed: SGOT: 20 (10/31/2008)   SGPT: 17 (10/31/2008)   HDL:68.2 (04/17/2007), 65.6 (09/05/2006)  LDL:48 (04/17/2007), 84 (09/05/2006)  Chol:141 (04/17/2007), 178 (09/05/2006)  Trig:123 (04/17/2007), 140 (09/05/2006)  Orders: Venipuncture [id number]) TLB-Lipid Panel (80061-LIPID)  Problem # 3:  DIABETES MELLITUS, TYPE II (ICD-250.00) not well-controlled, labs states her diet is okay and she is active history of edema with Actos and nausea with Glucophage remind her about the need for an eye checkup yearly and feet care  Her updated medication list for this problem includes:    Amaryl 2 Mg Tabs (Glimepiride) .Marland Kitchen... 1 tablet two times a day    Januvia 100 Mg Tabs (Sitagliptin phosphate) .Marland Kitchen... 1 by mouth once daily    Benicar 20 Mg Tabs (Olmesartan medoxomil) ..... Hold    Azor 5-40 Mg Tabs (Amlodipine-olmesartan) .Marland Kitchen... 1 a day    Baby Aspirin 81 Mg Chew (Aspirin)  Orders: TLB-A1C / Hgb A1C (Glycohemoglobin) (83036-A1C)  Labs Reviewed: Creat: 1.3 (01/14/2009)    Reviewed HgBA1c results: 9.3 (01/14/2009)  6.7 (07/01/2008)  Problem # 4:  GERD (ICD-530.81) refill omeprazole okay to use as needed same symptoms are very well controlled Her updated medication list for this problem includes:    Omeprazole 20 Mg Cpdr (Omeprazole) .Marland Kitchen... 1 by mouth once daily  Complete Medication List: 1)  Amaryl 2 Mg Tabs (Glimepiride) .Marland Kitchen.. 1 tablet two times a day 2)  Januvia 100 Mg Tabs (Sitagliptin phosphate) .Marland Kitchen.. 1 by mouth once daily 3)  Lipitor 10 Mg Tabs (Atorvastatin calcium) .Marland Kitchen.. 1 by mouth qd 4)  Furosemide 20 Mg Tabs (Furosemide) .... Take 2  tablets  daily 5)  Benicar 20 Mg Tabs (Olmesartan medoxomil) .... Hold 6)  Azor 5-40 Mg Tabs  (Amlodipine-olmesartan) .Marland Kitchen.. 1 a day 7)  Premarin 1.25 Mg Tabs (Estrogens conjugated) .... Take one tablet daily 8)  Astepro 137 Mcg/spray Soln (Azelastine hcl) .... 2 puffs on each side of the nose  two times a day 9)  Flonase 50 Mcg/act Susp (Fluticasone propionate) .... 2 puff once daily 10)  Omeprazole 20 Mg Cpdr (Omeprazole) .Marland Kitchen.. 1 by mouth once daily 11)  Celebrex 200 Mg Caps (Celecoxib) .Marland Kitchen.. 1 by mouth once daily 12)  Allegra 180 Mg Tabs (Fexofenadine hcl) .Marland Kitchen.. 1 by mouth once daily 13)  Baby Aspirin 81 Mg Chew (Aspirin) 14)  Loratadine 10 Mg Tabs (Loratadine) .Marland Kitchen.. 1 by mouth daily  Patient Instructions: 1)  hold Benicar, take AZOR instead 2)  call if side effects 3)  see my nurse  in 3 weeks for a BP check 4)  Please schedule a follow-up appointment in 3 months  (fasting yearly exam) Prescriptions: ALLEGRA 180 MG TABS (FEXOFENADINE HCL) 1 by mouth once daily  #90 x 1   Entered by:   Dawson Bills   Authorized by:   Alda Berthold. Belicia Difatta MD   Signed by:   Dawson Bills on 04/23/2009   Method used:   Electronically to        [facility] (mail-order)       [facility]       [address] [place]  [address]       Ph: [phone]       Fax: [phone]   RxID:   [id number] PREMARIN 1.25 MG  TABS (ESTROGENS CONJUGATED) Take one tablet daily  #90 x 1   Entered by:   Dawson Bills   Authorized by:   Alda Berthold. Shilo Philipson MD   Signed by:   Dawson Bills on 04/23/2009   Method used:   Electronically to        [facility] (mail-order)       [facility]       [address] [place]  [address]       Ph: [phone]       Fax: [phone]   RxID:   [id number] LIPITOR 10 MG  TABS (ATORVASTATIN CALCIUM) 1 by mouth qd  #90 x 1   Entered by:   Dawson Bills   Authorized by:   Alda Berthold. Haila Dena MD   Signed by:   Dawson Bills on 04/23/2009   Method used:   Electronically to        [facility]  (mail-order)       [facility]       [address] [place]  [address]       Ph: [phone]       Fax: [phone]   RxID:   [id number] AMARYL 2 MG TABS (GLIMEPIRIDE) 1 tablet two times a day  #180 x 1   Entered by:   Dawson Bills   Authorized by:   Alda Berthold. Kaidyn Javid MD   Signed by:   Dawson Bills on 04/23/2009   Method used:   Electronically to        [facility] (mail-order)       [facility]       [address] [place]  [address]       Ph: [phone]       Fax: [phone]   RxID:   [id number] OMEPRAZOLE 20 MG CPDR (OMEPRAZOLE) 1 by mouth once daily  #90 x 1   Entered by:   Dawson Bills   Authorized by:   Alda Berthold. Shanigua Gibb MD   Signed by:   Dawson Bills on 04/23/2009   Method used:   Electronically to        [facility] (mail-order)       [facility]       [address] [place]  [address]       Ph: [phone]       Fax: [phone]   [place]:   [phone]

## 2010-03-24 NOTE — Medication Information (Signed)
Summary: [facility]   Imported By: Edmonia [name] 07/02/2009 13:52:21  _____________________________________________________________________  External Attachment:    Type:   Image     Comment:   External Document

## 2010-03-24 NOTE — Progress Notes (Signed)
Summary: CBG and BP readings  Phone Note Call from Patient Call back at Home Phone [phone]   Summary of Call: CBG and BP readings: 11/23-- 116  127/72 11/24-- 126  102/61 11/25-- 101  120/68 11/26-- 94    124/67 11/27--83     112/61 11/28--  104  110/63 11/29-- 117   124/74 11/30-- 96  111/66 12/1-- 97   119/68 12/2-- 109  119/68 12/3-- 91  118/67 12/4-- 97  121/70 12/5-- 93  122/72 12/6-- 105  106/45 12/7-- 118  113/63 Initial call taken by: Ernestene Mention CMA,  January 28, 2010 10:33 AM  Follow-up for Phone Call        good results Continue with same treatment Next follow up  ~ 03-2010 Follow-up by: Alda Berthold. [name] MD,  January 28, 2010 3:15 PM  Additional Follow-up for Phone Call Additional follow up Details #1::        Patient notified. Ernestene Mention CMA  January 28, 2010 3:47 PM

## 2010-03-24 NOTE — Miscellaneous (Signed)
Summary: BONE DENSITY  Clinical Lists Changes  Orders: Added new Test order of T-Bone Densitometry (77080) - Signed Added new Test order of T-Lumbar Vertebral Assessment (77082) - Signed 

## 2010-03-26 NOTE — Letter (Signed)
Summary: [facility]  [facility]   Imported By: Edmonia [name] 02/19/2010 12:25:27  _____________________________________________________________________  External Attachment:    Type:   Image     Comment:   External Document  Appended Document: [facility] neg eye exam, f/u 1 year please enter in EMR  Appended Document: [facility]    Clinical Lists Changes  Observations: Added new observation of EYE EXAM: neg eye exam, f/u 1 year (02/04/2010 10:04)

## 2010-04-13 ENCOUNTER — Other Ambulatory Visit: Payer: Self-pay | Admitting: Internal Medicine

## 2010-04-13 ENCOUNTER — Ambulatory Visit (INDEPENDENT_AMBULATORY_CARE_PROVIDER_SITE_OTHER)
Admission: RE | Admit: 2010-04-13 | Discharge: 2010-04-13 | Disposition: A | Discharge status: Home / Self Care | Payer: Medicare Other | Source: Ambulatory Visit | Attending: Internal Medicine | Admitting: Internal Medicine

## 2010-04-13 ENCOUNTER — Ambulatory Visit (INDEPENDENT_AMBULATORY_CARE_PROVIDER_SITE_OTHER): Payer: Medicare Other | Admitting: Internal Medicine

## 2010-04-13 ENCOUNTER — Encounter: Payer: Self-pay | Admitting: Internal Medicine

## 2010-04-13 ENCOUNTER — Ambulatory Visit
Admission: RE | Admit: 2010-04-13 | Discharge: 2010-04-13 | Disposition: A | Discharge status: Home / Self Care | Payer: Medicare Other | Source: Ambulatory Visit | Attending: Internal Medicine | Admitting: Internal Medicine

## 2010-04-13 DIAGNOSIS — M159 Polyosteoarthritis, unspecified: Secondary | ICD-10-CM

## 2010-04-13 DIAGNOSIS — E119 Type 2 diabetes mellitus without complications: Secondary | ICD-10-CM

## 2010-04-13 DIAGNOSIS — E785 Hyperlipidemia, unspecified: Secondary | ICD-10-CM

## 2010-04-13 DIAGNOSIS — I10 Essential (primary) hypertension: Secondary | ICD-10-CM

## 2010-04-13 LAB — BASIC METABOLIC PANEL
BUN: 18 mg/dL (ref 6–23)
CO2: 30 mEq/L (ref 19–32)
Calcium: 9.6 mg/dL (ref 8.4–10.5)
Chloride: 101 mEq/L (ref 96–112)
Creatinine, Ser: 1.5 mg/dL — ABNORMAL HIGH (ref 0.4–1.2)
GFR: 42.58 mL/min — ABNORMAL LOW (ref >60.00)
Glucose, Bld: 128 mg/dL — ABNORMAL HIGH (ref 70–99)
Potassium: 4.8 mEq/L (ref 3.5–5.1)
Sodium: 139 mEq/L (ref 135–145)

## 2010-04-13 LAB — LIPID PANEL
HDL: 67.1 mg/dL (ref >39.00)
LDL Cholesterol: 75 mg/dL (ref 0–99)
Total CHOL/HDL Ratio: 2
Triglycerides: 68 mg/dL (ref 0.0–149.0)

## 2010-04-13 LAB — AST: AST: 24 U/L (ref 0–37)

## 2010-04-13 LAB — HEMOGLOBIN A1C: Hgb A1c MFr Bld: 7.6 % — ABNORMAL HIGH (ref 4.6–6.5)

## 2010-04-13 LAB — ALT: ALT: 21 U/L (ref 0–35)

## 2010-04-16 DIAGNOSIS — N189 Chronic kidney disease, unspecified: Secondary | ICD-10-CM

## 2010-04-16 DIAGNOSIS — N184 Chronic kidney disease, stage 4 (severe): Secondary | ICD-10-CM | POA: Insufficient documentation

## 2010-04-16 HISTORY — DX: Chronic kidney disease, unspecified: N18.9

## 2010-04-17 ENCOUNTER — Other Ambulatory Visit: Payer: Self-pay | Admitting: Internal Medicine

## 2010-04-17 DIAGNOSIS — N189 Chronic kidney disease, unspecified: Secondary | ICD-10-CM

## 2010-04-21 ENCOUNTER — Ambulatory Visit
Admission: RE | Admit: 2010-04-21 | Discharge: 2010-04-21 | Disposition: A | Discharge status: Home / Self Care | Payer: Medicare Other | Source: Ambulatory Visit | Attending: Internal Medicine | Admitting: Internal Medicine

## 2010-04-21 DIAGNOSIS — N189 Chronic kidney disease, unspecified: Secondary | ICD-10-CM

## 2010-04-21 NOTE — Assessment & Plan Note (Signed)
Summary: 4 MONTH ROV/LCH   Vital Signs:  Patient profile:   70 year old female Height:      64.5 inches Weight:      237 pounds BMI:     40.20 Pulse rate:   74 / minute Pulse rhythm:   regular BP sitting:   122 / 78  (left arm) Cuff size:   large  Vitals Entered By: Allyn Kenner CMA (April 13, 2010 9:34 AM) CC: 4 month f/u- fasting  Comments c/o swelling and pain w/ making a fist w/ hands. while walking feels like her legs are going to give out. [facility]    History of Present Illness: 4 month f/u- fasting  HYPERTENSION -- ambulatory BPs well controlled  DIABETES-- on lantus 55 u, CBGs satisfatory, see phone note; eye checked 02-2010---neg  HYPERLIPIDEMIA -- due for labs  OSTEOARTHROSIS-- continue w/ swollen and sore hands on-off, occasionally hand joints are puffy; occasionally her R leg gave out (?transient weakness, ?pain... hard for pt to explain) ; no falls     Current Medications (verified): 1)  Lantus Solostar 100 Unit/ml Soln (Insulin Glargine) .... 55  Units At Bedtime 2)  Januvia 100 Mg Tabs (Sitagliptin Phosphate) .Marland Kitchen.. 1 By Mouth Once Daily 3)  Lipitor 10 Mg  Tabs (Atorvastatin Calcium) .Marland Kitchen.. 1 By Mouth Qd 4)  Furosemide 20 Mg  Tabs (Furosemide) .... Take 2  Tablets  Daily 5)  Benicar 20 Mg Tabs (Olmesartan Medoxomil) .... Hold 6)  Azor 10-40 Mg Tabs (Amlodipine-Olmesartan) .Marland Kitchen.. 1 A Day 7)  Astepro 137 Mcg/spray  Soln (Azelastine Hcl) .... 2 Puffs On Each Side of The Nose  Two Times A Day 8)  Flonase 50 Mcg/act Susp (Fluticasone Propionate) .... 2 Puff Once Daily 9)  Prilosec 20 Mg Cpdr (Omeprazole) .... Take 1 Tab Once Daily 10)  Celebrex 200 Mg Caps (Celecoxib) .Marland Kitchen.. 1 By Mouth Once Daily 11)  Allegra 180 Mg Tabs (Fexofenadine Hcl) .Marland Kitchen.. 1 By Mouth Once Daily 12)  Baby Aspirin 81 Mg  Chew (Aspirin) 13)  Loratadine 10 Mg Tabs (Loratadine) .Marland Kitchen.. 1 By Mouth Daily 14)  Pen Needles 31g X 6 Mm Misc (Insulin Pen Needle) .Marland Kitchen.. 1  Daily  Allergies (verified): No Known Drug Allergies  Past History:  Past Medical History: Reviewed history from 08/11/2009 and no changes required. HYPERTENSION  DIABETES MELLITUS, TYPE II   HYPERLIPIDEMIA  OSTEOARTHROSIS, GENERALIZED, MULTIPLE SITES   ANKLE PAIN, CHRONIC  RHINITIS, ALLERGIC NOS    Past Surgical History: Reviewed history from 11/14/2007 and no changes required. Cholecystectomy Hysterectomy, partial in her 59s  Social History: Reviewed history from 04/23/2009 and no changes required. Retired Married, husband is a Company secretary  2 children Never Smoked Alcohol use-no Drug use-no Regular exercise-yes: walking   Review of Systems CV:  Denies chest pain or discomfort; edema at baseline . Resp:  Denies cough and shortness of breath. GI:  Denies diarrhea, nausea, and vomiting. Endo:  no low sugar symptoms no paresthesias at LE.  Physical Exam  General:  alert, well-developed, and overweight-appearing.   Lungs:  normal respiratory effort, no intercostal retractions, no accessory muscle use, and normal breath sounds.   Heart:  normal rate, regular rhythm, and no murmur.   Msk:  inspection and palpation of wrist-hands: no synovitis, no deformities arms w/o s.q. nodules  Extremities:  trace pretibial edema bilaterally    Impression & Recommendations:  Problem # 1:  OSTEOARTHROSIS, GENERALIZED, MULTIPLE SITES (ICD-715.09) continue w/ hand symptoms  leg "gave  out" plan:  sedrate, XRs, ortho referal if creatinine stil 1.5---d/c celebrex  Her updated medication list for this problem includes:    Celebrex 200 Mg Caps (Celecoxib) .Marland Kitchen... 1 by mouth once daily    Baby Aspirin 81 Mg Chew (Aspirin)  Orders: TLB-Sedimentation Rate (ESR) (85652-ESR) T-Hand Left 3 Views (73130TC) T-Hand Right 3 views (73130TC) Specimen Handling (99000)  Problem # 2:  HYPERTENSION (ICD-401.9) well contolled check BMP, if creatinine still 1.5 or >, d/c  Celebrex Her updated  medication list for this problem includes:    Furosemide 20 Mg Tabs (Furosemide) .Marland Kitchen... Take 2  tablets  daily    Benicar 20 Mg Tabs (Olmesartan medoxomil) ..... Hold    Azor 10-40 Mg Tabs (Amlodipine-olmesartan) .Marland Kitchen... 1 a day  Orders: TLB-BMP (Basic Metabolic Panel-BMET) (99991111) Specimen Handling (99000)  BP today: 122/78 Prior BP: 128/86 (12/12/2009)  Prior 10 Yr Risk Heart Disease: 13 % (04/17/2007)  Labs Reviewed: K+: 4.1 (12/12/2009) Creat: : 1.5 (12/12/2009)   Chol: 148 (04/23/2009)   HDL: 83.10 (04/23/2009)   LDL: 43 (04/23/2009)   TG: 111.0 (04/23/2009)  Problem # 3:  HYPERLIPIDEMIA (ICD-272.4)  Her updated medication list for this problem includes:    Lipitor 10 Mg Tabs (Atorvastatin calcium) .Marland Kitchen... 1 by mouth qd  Orders: TLB-Lipid Panel (80061-LIPID) TLB-ALT (SGPT) (84460-ALT) TLB-AST (SGOT) (84450-SGOT) Specimen Handling (99000)  Labs Reviewed: SGOT: 26 (08/11/2009)   SGPT: 20 (08/11/2009)     Problem # 4:  DIABETES MELLITUS, TYPE II (ICD-250.00) labs  Her updated medication list for this problem includes:    Lantus Solostar 100 Unit/ml Soln (Insulin glargine) .Marland KitchenMarland KitchenMarland KitchenMarland Kitchen 55  units at bedtime    Januvia 100 Mg Tabs (Sitagliptin phosphate) .Marland Kitchen... 1 by mouth once daily    Benicar 20 Mg Tabs (Olmesartan medoxomil) ..... Hold    Azor 10-40 Mg Tabs (Amlodipine-olmesartan) .Marland Kitchen... 1 a day    Baby Aspirin 81 Mg Chew (Aspirin)  Orders: Venipuncture [id number]) TLB-A1C / Hgb A1C (Glycohemoglobin) (83036-A1C) TLB-Microalbumin/Creat Ratio, Urine (82043-MALB) Specimen Handling (99000)  Labs Reviewed: Creat: 1.5 (12/12/2009)    Reviewed HgBA1c results: 8.7 (12/12/2009)  8.6 (08/11/2009)  Complete Medication List: 1)  Lantus Solostar 100 Unit/ml Soln (Insulin glargine) .... 55  units at bedtime 2)  Januvia 100 Mg Tabs (Sitagliptin phosphate) .Marland Kitchen.. 1 by mouth once daily 3)  Lipitor 10 Mg Tabs (Atorvastatin calcium) .Marland Kitchen.. 1 by mouth qd 4)  Furosemide 20 Mg Tabs  (Furosemide) .... Take 2  tablets  daily 5)  Benicar 20 Mg Tabs (Olmesartan medoxomil) .... Hold 6)  Azor 10-40 Mg Tabs (Amlodipine-olmesartan) .Marland Kitchen.. 1 a day 7)  Astepro 137 Mcg/spray Soln (Azelastine hcl) .... 2 puffs on each side of the nose  two times a day 8)  Flonase 50 Mcg/act Susp (Fluticasone propionate) .... 2 puff once daily 9)  Prilosec 20 Mg Cpdr (Omeprazole) .... Take 1 tab once daily 10)  Celebrex 200 Mg Caps (Celecoxib) .Marland Kitchen.. 1 by mouth once daily 11)  Allegra 180 Mg Tabs (Fexofenadine hcl) .Marland Kitchen.. 1 by mouth once daily 12)  Baby Aspirin 81 Mg Chew (Aspirin) 13)  Loratadine 10 Mg Tabs (Loratadine) .Marland Kitchen.. 1 by mouth daily 14)  Pen Needles 31g X 6 Mm Misc (Insulin pen needle) .Marland Kitchen.. 1 daily  Patient Instructions: 1)  Please schedule a follow-up appointment in 4 months .    Orders Added: 1)  Venipuncture [id number] 2)  TLB-A1C / Hgb A1C (Glycohemoglobin) [83036-A1C] 3)  TLB-Microalbumin/Creat Ratio, Urine [82043-MALB] 4)  TLB-Sedimentation Rate (ESR) [85652-ESR] 5)  TLB-Lipid Panel [80061-LIPID] 6)  TLB-BMP (Basic Metabolic Panel-BMET) 123456 7)  TLB-ALT (SGPT) [84460-ALT] 8)  TLB-AST (SGOT) [84450-SGOT] 9)  T-Hand Left 3 Views [73130TC] 10)  T-Hand Right 3 views [73130TC] 11)  Specimen Handling [99000] 12)  Est. Patient Level IV [id number]

## 2010-05-20 ENCOUNTER — Encounter: Payer: Self-pay | Admitting: Internal Medicine

## 2010-05-20 ENCOUNTER — Telehealth: Payer: Self-pay | Admitting: *Deleted

## 2010-05-20 ENCOUNTER — Ambulatory Visit (INDEPENDENT_AMBULATORY_CARE_PROVIDER_SITE_OTHER): Payer: Medicare Other | Admitting: Internal Medicine

## 2010-05-20 VITALS — BP 130/88 | HR 96 | Temp 98.2°F | Wt 239.2 lb

## 2010-05-20 DIAGNOSIS — I10 Essential (primary) hypertension: Secondary | ICD-10-CM

## 2010-05-20 DIAGNOSIS — R5381 Other malaise: Secondary | ICD-10-CM

## 2010-05-20 DIAGNOSIS — M159 Polyosteoarthritis, unspecified: Secondary | ICD-10-CM

## 2010-05-20 DIAGNOSIS — R5383 Other fatigue: Secondary | ICD-10-CM

## 2010-05-20 DIAGNOSIS — R300 Dysuria: Secondary | ICD-10-CM

## 2010-05-20 DIAGNOSIS — E119 Type 2 diabetes mellitus without complications: Secondary | ICD-10-CM

## 2010-05-20 DIAGNOSIS — N189 Chronic kidney disease, unspecified: Secondary | ICD-10-CM

## 2010-05-20 LAB — POCT URINALYSIS DIPSTICK
Blood, UA: NEGATIVE
Glucose, UA: NEGATIVE
Nitrite, UA: NEGATIVE
Spec Grav, UA: 1.005
Urobilinogen, UA: NEGATIVE
pH, UA: 6.5

## 2010-05-20 NOTE — Assessment & Plan Note (Signed)
Reports good tolerance to the new dose of insulin, no symptoms consistent with hypoglycemia, no change

## 2010-05-20 NOTE — Telephone Encounter (Signed)
X-rays showed some DJD, not an unexpected finding

## 2010-05-20 NOTE — Assessment & Plan Note (Signed)
Her creatinine has gradually increased, up to a year ago was around 1.3, it was noticed to be 1.5 recently. We discontinue Celebrex, renal ultrasound normal. Labs

## 2010-05-20 NOTE — Assessment & Plan Note (Signed)
Developed generalized fatigue and aches last month after she discontinue Celebrex. Overall now feels better and is taking Tylenol. No synovitis on exam. We'll check a CBC and a sedimentation rate

## 2010-05-20 NOTE — Patient Instructions (Signed)
Take Tylenol 500 mg one or 2 tabs every 6 hours as needed If the bladder symptoms come back, let me know

## 2010-05-20 NOTE — Telephone Encounter (Signed)
Pt would like to know what the x-ray said of her hand? She states you were going to look into this during her visit?

## 2010-05-20 NOTE — Progress Notes (Signed)
  Subjective:    Patient ID: Angel Green, female    DOB: 1940/10/25, 70 y.o.   MRN: [id number]  HPI Was seen recently for a routine visit, A1c was 7.6, insulin was increased to 65 units. With the increase in insulin, her blood sugars before breakfast are 97, 101, 82. Also her creatinine was noted to be elevated, she was recommended to hold Celebrex , a  ultrasound of the kidney was performed ---->  normal.  Immediately after we adjusted her insulin and stopped Celebrex, she felt extremely weak, she developed generalized aches in her hips, legs, ankles. Some pain  in the shoulders but less intense. Overall she is improving in the last few weeks , she is taking Tylenol as needed  Also, 4 days ago she developed some difficulty urinating, dysuria and she so few drops of blood in the urine. Overall she is better now.  Review of Systems No nausea, vomiting, diarrhea No fever or rash, ++ fatigue  No weight loss She had mild headaches.  Past Medical History  Diagnosis Date  . Hypertension   . Diabetes mellitus, type 2   . Hyperlipidemia   . Osteoarthritis   . Ankle pain     chronic  . Rhinitis     allergic nos  . RENAL INSUFFICIENCY, CHRONIC 04/16/2010   Past Surgical History  Procedure Date  . Cholecystectomy   . Partial hysterectomy     in her 30s        Objective:   Physical Exam  Constitutional: She appears well-developed.  Cardiovascular: Normal rate, regular rhythm and normal heart sounds.   Pulmonary/Chest: Effort normal and breath sounds normal. No respiratory distress. She has no wheezes. She has no rales.  Musculoskeletal: She exhibits no edema.       Inspection and palpation of the hands and wrists show no synovitis No lower extremity edema          Assessment & Plan:

## 2010-05-20 NOTE — Assessment & Plan Note (Signed)
Ambulatory blood pressure usually 120/80. No change

## 2010-05-21 ENCOUNTER — Other Ambulatory Visit: Payer: Self-pay | Admitting: Internal Medicine

## 2010-05-21 ENCOUNTER — Other Ambulatory Visit: Payer: Medicare Other

## 2010-05-21 ENCOUNTER — Encounter: Payer: Self-pay | Admitting: Internal Medicine

## 2010-05-21 LAB — BASIC METABOLIC PANEL
BUN: 15 mg/dL (ref 6–23)
Creatinine, Ser: 1.4 mg/dL — ABNORMAL HIGH (ref 0.4–1.2)
GFR: 46.74 mL/min — ABNORMAL LOW (ref >60.00)
Glucose, Bld: 100 mg/dL — ABNORMAL HIGH (ref 70–99)
Potassium: 4.5 mEq/L (ref 3.5–5.1)

## 2010-05-21 LAB — TSH: TSH: 1.26 u[IU]/mL (ref 0.35–5.50)

## 2010-05-21 LAB — CBC WITH DIFFERENTIAL/PLATELET
Basophils Absolute: 0 10*3/uL (ref 0.0–0.1)
Eosinophils Absolute: 0.2 10*3/uL (ref 0.0–0.7)
Eosinophils Relative: 3 % (ref 0–5)
Lymphocytes Relative: 40 % (ref 12–46)
MCH: 30.3 pg (ref 26.0–34.0)
MCV: 91.6 fL (ref 78.0–100.0)
Platelets: 289 10*3/uL (ref 150–400)
RDW: 14.5 % (ref 11.5–15.5)
WBC: 7.6 10*3/uL (ref 4.0–10.5)

## 2010-05-21 NOTE — Telephone Encounter (Signed)
I spoke w/ pt she is aware.  

## 2010-05-22 DIAGNOSIS — R5383 Other fatigue: Secondary | ICD-10-CM | POA: Insufficient documentation

## 2010-05-22 LAB — URINE CULTURE: Colony Count: 2000

## 2010-05-22 NOTE — Assessment & Plan Note (Signed)
She complains of fatigue today, not having really hypoglycemic symptoms just lack of energy. Labs I wonder if the fact that she is having more osteoarthritis pain is making her feel more tired.

## 2010-05-25 ENCOUNTER — Telehealth: Payer: Self-pay | Admitting: *Deleted

## 2010-05-25 NOTE — Telephone Encounter (Signed)
I spoke w/ she is aware of lab results. She has not restarted Celebrex.

## 2010-05-25 NOTE — Telephone Encounter (Signed)
I spoke w/ pt she feels she needs Celebrex.

## 2010-05-25 NOTE — Telephone Encounter (Signed)
Agree, we will consider restart it if needed

## 2010-05-25 NOTE — Telephone Encounter (Signed)
Pt is aware.  

## 2010-05-25 NOTE — Telephone Encounter (Signed)
Okay to restart Celebrex as before. Keep followup as planned. Will check here kidney function again when she comes back

## 2010-05-25 NOTE — Telephone Encounter (Signed)
Message copied by Allyn Kenner on Mon May 25, 2010 10:29 AM ------      Message from: Kathlene November      Created: Mon May 25, 2010 10:19 AM       Renal function is stable, did not improve by discontinuing Celebrex. She was apparently getting a lot of help from Celebrex thus will consider restart it      advise patient:      All labs within normal. Her kidney function is stable      Rec to come back to the office as planned. If she continued with severe fatigue in the next few weeks, please call before the next appointment, restart celebrex?

## 2010-05-31 LAB — URINALYSIS, ROUTINE W REFLEX MICROSCOPIC
Protein, ur: 100 mg/dL — AB
Urobilinogen, UA: 0.2 mg/dL (ref 0.0–1.0)

## 2010-06-03 ENCOUNTER — Encounter: Payer: Self-pay | Admitting: Family Medicine

## 2010-06-03 ENCOUNTER — Ambulatory Visit (INDEPENDENT_AMBULATORY_CARE_PROVIDER_SITE_OTHER): Payer: Medicare Other | Admitting: Family Medicine

## 2010-06-03 VITALS — BP 140/72 | Temp 99.0°F | Wt 240.1 lb

## 2010-06-03 DIAGNOSIS — J40 Bronchitis, not specified as acute or chronic: Secondary | ICD-10-CM

## 2010-06-03 MED ORDER — BENZONATATE 200 MG PO CAPS: 200.0000 mg | ORAL_CAPSULE | Freq: Three times a day (TID) | ORAL | Status: DC | PRN

## 2010-06-03 MED ORDER — AZITHROMYCIN 250 MG PO TABS: 250.0000 mg | ORAL_TABLET | Freq: Every day | ORAL | Status: AC

## 2010-06-03 NOTE — Patient Instructions (Signed)
This appears to be a bronchitis Take the Zpack as directed Use the cough pills as needed Drink plenty of fluids Continue your allergy meds Call with any questions or concerns Hang in there!!!

## 2010-06-03 NOTE — Progress Notes (Signed)
  Subjective:    Patient ID: Angel Green, female    DOB: December 23, 1940, 70 y.o.   MRN: [id number]  HPI URI- 'i have this cold that won't go away'.  Reports she frequently has this and typically requires a Zpack.  sxs started 2 weeks ago.  OTC meds w/out relief.  No fevers.  + nasal congestion, cough productive of 'thick greenish yellow phelgm', sinus pressure.  + sick contacts.     Review of Systems For ROS see HPI     Objective:   Physical Exam  Constitutional: She appears well-developed and well-nourished. No distress.  HENT:  Head: Normocephalic and atraumatic.       TMs normal bilaterally Mild nasal congestion Throat w/out erythema, edema, or exudate  Eyes: Conjunctivae and EOM are normal. Pupils are equal, round, and reactive to light.  Neck: Normal range of motion. Neck supple.  Cardiovascular: Normal rate, regular rhythm, normal heart sounds and intact distal pulses.   No murmur heard. Pulmonary/Chest: Effort normal and breath sounds normal. No respiratory distress. She has no wheezes.       + hacking cough  Lymphadenopathy:    She has no cervical adenopathy.          Assessment & Plan:

## 2010-06-09 ENCOUNTER — Encounter: Payer: Self-pay | Admitting: Internal Medicine

## 2010-06-09 ENCOUNTER — Ambulatory Visit (INDEPENDENT_AMBULATORY_CARE_PROVIDER_SITE_OTHER): Payer: Medicare Other | Admitting: Internal Medicine

## 2010-06-09 VITALS — BP 124/78 | HR 113 | Temp 98.1°F | Wt 241.2 lb

## 2010-06-09 DIAGNOSIS — J40 Bronchitis, not specified as acute or chronic: Secondary | ICD-10-CM

## 2010-06-09 MED ORDER — AMOXICILLIN 500 MG PO CAPS: 1000.0000 mg | ORAL_CAPSULE | Freq: Two times a day (BID) | ORAL | Status: DC

## 2010-06-09 MED ORDER — AMOXICILLIN 500 MG PO CAPS: 1000.0000 mg | ORAL_CAPSULE | Freq: Two times a day (BID) | ORAL | Status: AC

## 2010-06-09 MED ORDER — HYDROCODONE-HOMATROPINE 5-1.5 MG/5ML PO SYRP: 5.0000 mL | ORAL_SOLUTION | Freq: Four times a day (QID) | ORAL | Status: AC | PRN

## 2010-06-09 MED ORDER — INSULIN GLARGINE 100 UNIT/ML ~~LOC~~ SOLN: 65.0000 [IU] | Freq: Every day | SUBCUTANEOUS | Status: DC

## 2010-06-09 NOTE — Patient Instructions (Signed)
Amoxicillin for 10 days as prescribed Stop Tessalon Perles For cough, Mucinex DM twice a day and if the cough continue, use hydrocodone. Flonase and Astelin every day Call if not improving in a few days

## 2010-06-09 NOTE — Progress Notes (Signed)
  Subjective:    Patient ID: Angel Green, female    DOB: 14-Jan-1941, 70 y.o.   MRN: [id number]  HPI  URI type symptoms for 3 weeks, was seen a week ago, prescribed Z-Pak and Tessalon Perles. Despite the treatment she continue with most of her symptoms. Still coughing, some sputum. Continue with nose congestion and sinus pressure. She has a Flonase and Astelin, using Flonase regularly and Astelin as needed   Past Medical History  Diagnosis Date  . Hypertension   . Diabetes mellitus, type 2   . Hyperlipidemia   . Osteoarthritis   . Ankle pain     chronic  . Rhinitis     allergic nos  . RENAL INSUFFICIENCY, CHRONIC 04/16/2010    Review of Systems Some subjective fever No chest pain or shortness of breath No chest congestion, all congestion is concentrated in the sinuses Some lower extremity edema    Objective:   Physical Exam  Constitutional: She appears well-developed.  HENT:  Head: Normocephalic and atraumatic.  Mouth/Throat: Oropharynx is clear and moist. No oropharyngeal exudate.       Not tender to palpation on the sinuses. Nose congested  Eyes: EOM are normal. Right eye exhibits no discharge. Left eye exhibits no discharge. No scleral icterus.  Neck: Normal range of motion. Neck supple.  Cardiovascular: Normal rate, regular rhythm and normal heart sounds.   No murmur heard. Pulmonary/Chest: Effort normal and breath sounds normal. No respiratory distress. She has no wheezes.          Assessment & Plan:

## 2010-06-09 NOTE — Assessment & Plan Note (Signed)
Was seen a few days ago with bronchitis, not responding so far to Z-Pak. Today her symptoms are mostly concentrated on the sinus area. see instructions

## 2010-06-13 NOTE — Assessment & Plan Note (Signed)
Pt's sxs consistent w/ bronchitis.  Reports that Zpacks work well for her.  Start Zpack and tessalon for cough.  Reviewed supportive care and red flags that should prompt return.  Pt expressed understanding and is in agreement w/ plan.

## 2010-06-30 ENCOUNTER — Ambulatory Visit (INDEPENDENT_AMBULATORY_CARE_PROVIDER_SITE_OTHER): Payer: Medicare Other | Admitting: Internal Medicine

## 2010-06-30 ENCOUNTER — Telehealth: Payer: Self-pay | Admitting: Internal Medicine

## 2010-06-30 ENCOUNTER — Ambulatory Visit (INDEPENDENT_AMBULATORY_CARE_PROVIDER_SITE_OTHER)
Admission: RE | Admit: 2010-06-30 | Discharge: 2010-06-30 | Disposition: A | Discharge status: Home / Self Care | Payer: Medicare Other | Source: Ambulatory Visit | Attending: Internal Medicine | Admitting: Internal Medicine

## 2010-06-30 ENCOUNTER — Encounter: Payer: Self-pay | Admitting: Internal Medicine

## 2010-06-30 ENCOUNTER — Ambulatory Visit: Payer: Medicare Other | Admitting: Internal Medicine

## 2010-06-30 VITALS — BP 128/76 | HR 95 | Temp 98.3°F | Wt 239.4 lb

## 2010-06-30 DIAGNOSIS — R059 Cough, unspecified: Secondary | ICD-10-CM

## 2010-06-30 DIAGNOSIS — J309 Allergic rhinitis, unspecified: Secondary | ICD-10-CM

## 2010-06-30 DIAGNOSIS — R05 Cough: Secondary | ICD-10-CM

## 2010-06-30 MED ORDER — HYDROCODONE-HOMATROPINE 5-1.5 MG/5ML PO SYRP: 5.0000 mL | ORAL_SOLUTION | Freq: Four times a day (QID) | ORAL | Status: AC | PRN

## 2010-06-30 NOTE — Patient Instructions (Signed)
Continue with the same medicines. Our allergies will call you and try to see you before you leave on May 10. Call if your symptoms are severe or if you have fever Also, please get a chest x-ray

## 2010-06-30 NOTE — Progress Notes (Signed)
  Subjective:    Patient ID: Angel Green, female    DOB: [birth year], 70 y.o.   MRN: [id number]  HPI  Symptoms started late March, see the previous office visit notes. She was diagnosed with bronchitis, sinusitis. Took a Z-Pak and then amoxicillin. After the last visit, she got better temporarily but now she is back with about the same symptoms.  Past Medical History  Diagnosis Date  . Hypertension   . Diabetes mellitus, type 2   . Hyperlipidemia   . Osteoarthritis   . Ankle pain     chronic  . Rhinitis     allergic nos  . RENAL INSUFFICIENCY, CHRONIC 04/16/2010   Past Surgical History  Procedure Date  . Cholecystectomy   . Partial hysterectomy     in her 30s     Review of Systems Denies fevers but feels sweaty sometimes Her main complaint today is cough which is productive sometimes. Denies much sinus pressure, no sneezing, she does have a runny nose. Occasionally runny eyes and itchy eyes but those symptoms are not severe. Hycodan did help. No GERD symptoms., No wheezing.    Objective:   Physical Exam Alert, oriented, no apparent distress, afebrile. Face is symmetric, nontender to palpation. Nose is slightly congested. Throat, no red or discharge. Lungs, clear to auscultation bilaterally.        Assessment & Plan:

## 2010-06-30 NOTE — Telephone Encounter (Signed)
Pleas advise if we can work this pt in thanks

## 2010-06-30 NOTE — Assessment & Plan Note (Addendum)
Ongoing upper respiratory symptoms despite antibiotics x2, and also despite a  good antiallergy regimen. I don't believe more antibiotics will make a difference  Unless she has occult  sinusitis. Plan: Chest x-ray Continue astepro, Flonase, Allegra. Refill Hycodan. I'm asking our allergist to see her this week, she is leaving town May 10. I would like her to be seen before that time.

## 2010-07-01 NOTE — Telephone Encounter (Signed)
Lm for Angel Green to call back to advise of this. Pt already scheduled for apt.

## 2010-07-01 NOTE — Telephone Encounter (Signed)
lmomtcb x1 for pt to call back since Dr. Gabriel Carina was closed when tried to call.

## 2010-07-01 NOTE — Telephone Encounter (Signed)
Please let them know that the soonest we have for appt is Tuesday 07-07-2010 at 345pm; pt needs to be here at 330pm with insurance card, co pay, and medication list. Angel Green

## 2010-07-01 NOTE — Telephone Encounter (Signed)
Angel Green w/ dr Larose Kells office returned call from triage. [phone].

## 2010-07-02 NOTE — Telephone Encounter (Signed)
[facility] for pt to call back so we can inform her of appt date and time with Dr. Annamaria Boots.

## 2010-07-03 NOTE — Telephone Encounter (Signed)
According to apt notes pt cancelled apt for today due to her not being in the state. Pt has apt 6/21 at 9:30 as pt scheduled this apt date and time. Will sign off message

## 2010-07-07 ENCOUNTER — Institutional Professional Consult (permissible substitution): Payer: Medicare Other | Admitting: Internal Medicine

## 2010-07-10 NOTE — Op Note (Signed)
   Angel Green, Angel Green                         ACCOUNT NO.:  [id number]   MEDICAL RECORD NO.:  [id number]                   PATIENT TYPE:  AMB   LOCATION:  ENDO                                 FACILITY:  [facility]   PHYSICIAN:  Nelwyn Salisbury, M.D.               DATE OF BIRTH:  06-02-1940   DATE OF PROCEDURE:  03/06/2002  DATE OF DISCHARGE:                                 OPERATIVE REPORT   PROCEDURE PERFORMED:  Screening colonoscopy.   ENDOSCOPIST:  Nelwyn Salisbury, M.D.   INSTRUMENTS USED:  Olympus video colonoscope.   PREMEDICATION:  Demerol 60 mg and Versed 6 mg intravenously.   INDICATIONS FOR PROCEDURE:  This is a 70 year old African American female  with guaiac-positive stools; rule out colonic polyps, masses, etc.   PREPROCEDURE PREPARATION:  Informed consent was procured from the patient.  The patient fasted for eight hours prior to the procedure and prepped with a  bottle of magnesium citrate and a gallon of NuLytely the night prior to the  procedure.   PREPROCEDURE PHYSICAL EXAMINATION:  VITAL SIGNS:  The patient had stable  vital signs.  NECK:  Neck is supple.  CHEST:  Clear to auscultation.  HEART:  S1, S2.  ABDOMEN:  Soft with normal bowel sounds.   DESCRIPTION OF PROCEDURE:  The patient was placed in the left lateral  decubitus position and sedated with 60 mg of Demerol and 6 mg of Versed  intravenously.  Once the patient was adequately sedated and maintained on  low-flow oxygen and continuous cardiac monitoring, the Olympus video  colonoscope was advanced from the rectum to the cecum without difficulty.  The patient had some residual stool in the colon and multiple washes were  done.  A very small lesion could have been missed; however, no large masses,  polyps, erosions, ulcerations, or diverticula seen.  Small internal  hemorrhoids were seen on retroflexion of the rectum.   IMPRESSION:  Normal colonoscopy up to the cecum except for small internal  hemorrhoids.    RECOMMENDATIONS:  1. Repeat guaiac testing will be done on an outpatient basis and repeat     colorectal cancer screening in the next ten years unless the patient     remains no symptoms in the interim.  2. Outpatient followup in the next ten days.                                               Nelwyn Salisbury, M.D.    JNM/MEDQ  D:  03/06/2002  T:  03/06/2002  Job:  [id number]   cc:   Royetta Crochet. Karlton Lemon, M.D.  [address]  [place]  [place] [address]  Fax: [phone]

## 2010-08-10 ENCOUNTER — Ambulatory Visit: Payer: Medicare Other | Admitting: Internal Medicine

## 2010-08-13 ENCOUNTER — Other Ambulatory Visit: Payer: Medicare Other

## 2010-08-13 ENCOUNTER — Encounter: Payer: Self-pay | Admitting: Internal Medicine

## 2010-08-13 ENCOUNTER — Ambulatory Visit (INDEPENDENT_AMBULATORY_CARE_PROVIDER_SITE_OTHER): Payer: Medicare Other | Admitting: Internal Medicine

## 2010-08-13 VITALS — BP 116/74 | HR 95 | Ht 64.75 in | Wt 238.8 lb

## 2010-08-13 DIAGNOSIS — J42 Unspecified chronic bronchitis: Secondary | ICD-10-CM

## 2010-08-13 DIAGNOSIS — J309 Allergic rhinitis, unspecified: Secondary | ICD-10-CM

## 2010-08-13 NOTE — Patient Instructions (Addendum)
Lab- Allergy profile  Try Allegra/ fexofenadine  antihistamine to see if it dries up the phlegm  Sample Qvar 40  Inhaler---  2 puffs and rinse mouth, twice every day till the sample is used up.

## 2010-08-13 NOTE — Assessment & Plan Note (Signed)
Main c/o is excess phlegm, which is no necessarily pathologic, but it frets her.  We will send allergy profile panel to evaluate IgE status, and try steroid inhaler and antihistamine.

## 2010-08-13 NOTE — Progress Notes (Signed)
  Subjective:    Patient ID: Angel Green, female    DOB: [birth year], 70 y.o.   MRN: [id number]  HPI 08/12/10 -20 yoF never smoker, seen at kind request of Dr Larose Kells. Complains that for 3 years she has had excessive phlegm/ congestion in chest and throat. ?this is present most of the time. Nasal sprays reduce the drainage. Feels that this condition makes her weak and washed out. Symptoms worse in Spring and with weather changes. Seemed better while she was being treated for a cold. Denies dysphagia or reflux but has been told she has GERD. Nasal surgery-? Septoplasty?in past. Allergy skin tested twice with little positive response. Denies asthma, skin rash or relation to meals. Astelin and Flonase used intermittently.  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 Retired from [employer] work. CXR- neg, NAD 06/30/10  Review of Systems Constitutional:   No weight loss, night sweats,  Fevers, chills, fatigue, lassitude.     Says she doesn't feel pain normally HEENT:   No headaches,  Difficulty swallowing,  Tooth/dental problems,  Sore throat,                No sneezing, itching, ear ache,  CV:  No chest pain, orthopnea, PND, swelling in lower extremities, anasarca, dizziness, palpitations  GI  No heartburn, indigestion, abdominal pain, nausea, vomiting, diarrhea, change in bowel habits, loss of appetite  Resp: No shortness of breath with exertion or at rest.  No excess mucus, no productive cough,  No non-productive cough,  No coughing up of blood.  No change in color of mucus.  No wheezing.    Skin: no rash or lesions.  GU: no dysuria, change in color of urine, no urgency or frequency.  No flank pain.  MS:  No joint pain or swelling.  No decreased range of motion.  No back pain.  Psych:  No change in mood or affect. No depression or anxiety.  No memory loss.      Objective:   Physical Exam General- Alert, Oriented, Affect-appropriate, Distress- none acute     obese  Skin- rash-none, lesions- none, excoriation-  none  Lymphadenopathy- none  Head- atraumatic  Eyes- Gross vision intact, PERRLA, conjunctivae clear secretions  Ears- Hearing, canals, Tm- normal  Nose- Clear, No-Septal dev, mucus, polyps, erosion, perforation   Throat- Mallampati II , mucosa clear , drainage- none, tonsils- atrophic  Neck- flexible , trachea midline, no stridor , thyroid nl, carotid no bruit  Chest - symmetrical excursion , unlabored     Heart/CV- RRR , no murmur , no gallop  , no rub, nl s1 s2                     - JVD- none , edema- none, stasis changes- none, varices- none     Lung- clear to P&A, wheeze- none, cough- none , dullness-none, rub- none     Chest wall-   Abd- tender-no, distended-no, bowel sounds-present, HSM- no  Br/ Gen/ Rectal- Not done, not indicated  Extrem- cyanosis- none, clubbing, none, atrophy- none, strength- nl  Neuro- grossly intact to observation         Assessment & Plan:

## 2010-08-17 ENCOUNTER — Ambulatory Visit (INDEPENDENT_AMBULATORY_CARE_PROVIDER_SITE_OTHER): Payer: Medicare Other | Admitting: Internal Medicine

## 2010-08-17 ENCOUNTER — Encounter: Payer: Self-pay | Admitting: Internal Medicine

## 2010-08-17 DIAGNOSIS — N189 Chronic kidney disease, unspecified: Secondary | ICD-10-CM

## 2010-08-17 DIAGNOSIS — I10 Essential (primary) hypertension: Secondary | ICD-10-CM

## 2010-08-17 DIAGNOSIS — M1990 Unspecified osteoarthritis, unspecified site: Secondary | ICD-10-CM

## 2010-08-17 DIAGNOSIS — E119 Type 2 diabetes mellitus without complications: Secondary | ICD-10-CM

## 2010-08-17 LAB — BASIC METABOLIC PANEL
Chloride: 101 mEq/L (ref 96–112)
GFR: 38.74 mL/min — ABNORMAL LOW (ref >60.00)
Glucose, Bld: 92 mg/dL (ref 70–99)
Potassium: 5 mEq/L (ref 3.5–5.1)
Sodium: 139 mEq/L (ref 135–145)

## 2010-08-17 LAB — ALLERGY FULL PROFILE
Allergen, D pternoyssinus,d7: <0.1 kU/L (ref <0.35)
Bahia Grass: <0.1 kU/L (ref <0.35)
Box Elder IgE: <0.1 kU/L (ref <0.35)
Common Ragweed: <0.1 kU/L (ref <0.35)
Curvularia lunata: <0.1 kU/L (ref <0.35)
Dog Dander: <0.1 kU/L (ref <0.35)
Fescue: <0.1 kU/L (ref <0.35)
G005 Rye, Perennial: <0.1 kU/L (ref <0.35)
G009 Red Top: <0.1 kU/L (ref <0.35)
Goldenrod: <0.1 kU/L (ref <0.35)
Helminthosporium halodes: <0.1 kU/L (ref <0.35)
House Dust Hollister: <0.1 kU/L (ref <0.35)
Oak: <0.1 kU/L (ref <0.35)
Sycamore Tree: <0.1 kU/L (ref <0.35)
Timothy Grass: <0.1 kU/L (ref <0.35)

## 2010-08-17 NOTE — Assessment & Plan Note (Addendum)
The deformity consistent with DJD. Refer to Dr. Legrand Como [phone] (podiatrist, per pt request)

## 2010-08-17 NOTE — Progress Notes (Signed)
  Subjective:    Patient ID: Angel Green, female    DOB: 1940-07-19, 70 y.o.   MRN: [id number]  HPI Saw Dr Annamaria Boots, Rx an inhaler, helping some so far CRI-- still taking Celebrex. Diabetes, good compliance with 65 units of Lantus  Also has some deformities of her feet, likes a podiatry referral  Past Medical History  Diagnosis Date  . Hypertension   . Diabetes mellitus, type 2   . Hyperlipidemia   . Osteoarthritis   . Ankle pain     chronic  . Rhinitis     allergic nos  . RENAL INSUFFICIENCY, CHRONIC 04/16/2010   Past Surgical History  Procedure Date  . Cholecystectomy   . Partial hysterectomy     in her 30s     Review of Systems Ambulatory blood pressures wnl , not taking Benicar or Azor her blood sugars very well controlled, ranging from 94-116, no low sugar symptoms.     Objective:   Physical Exam  Constitutional: She appears well-developed and well-nourished.  Cardiovascular: Normal rate and normal heart sounds.   No murmur heard. Pulmonary/Chest: Breath sounds normal. No respiratory distress. She has no wheezes. She has no rales.  Musculoskeletal: She exhibits no edema.       Toe deformities consistent with DJD          Assessment & Plan:

## 2010-08-17 NOTE — Assessment & Plan Note (Signed)
Doing well with current therapy, labs

## 2010-08-17 NOTE — Assessment & Plan Note (Addendum)
  At some point she was recommended to use azor instead of benicar due to poorly controlled blood pressure. Currently her BP is well-controlled, has not been taking Benicar or azor , both are on her med list: will officially d/c them . Restart prn  Labs

## 2010-08-17 NOTE — Assessment & Plan Note (Signed)
Labs She was recommended to discontinue Celebrex in the past but she's still taking it. Recommend to discontinue Celebrex, Tylenol for pain.

## 2010-08-17 NOTE — Patient Instructions (Signed)
Please discontinue Celebrex, Tylenol for pain

## 2010-08-18 ENCOUNTER — Other Ambulatory Visit: Payer: Self-pay | Admitting: *Deleted

## 2010-08-18 MED ORDER — SITAGLIPTIN PHOSPHATE 100 MG PO TABS: 100.0000 mg | ORAL_TABLET | Freq: Every day | ORAL | Status: DC

## 2010-08-18 MED ORDER — OMEPRAZOLE 20 MG PO CPDR: 20.0000 mg | DELAYED_RELEASE_CAPSULE | Freq: Every day | ORAL | Status: DC

## 2010-08-18 MED ORDER — INSULIN GLARGINE 100 UNIT/ML ~~LOC~~ SOLN: 65.0000 [IU] | Freq: Every day | SUBCUTANEOUS | Status: DC

## 2010-08-18 MED ORDER — FUROSEMIDE 20 MG PO TABS: 40.0000 mg | ORAL_TABLET | Freq: Every day | ORAL | Status: DC

## 2010-08-18 MED ORDER — ATORVASTATIN CALCIUM 10 MG PO TABS: 10.0000 mg | ORAL_TABLET | Freq: Every day | ORAL | Status: DC

## 2010-08-18 NOTE — Progress Notes (Signed)
Quick Note:  Spoke with patient-aware of results. ______ 

## 2010-08-18 NOTE — Telephone Encounter (Signed)
Pt is asking for a refill for her Xanax-- i do not see it on pts med list.

## 2010-08-18 NOTE — Telephone Encounter (Signed)
Agree, I don't see that we have Rx it

## 2010-08-18 NOTE — Telephone Encounter (Signed)
Pt corrected it, she needs omeprazole.

## 2010-08-20 ENCOUNTER — Encounter: Payer: Self-pay | Admitting: Internal Medicine

## 2010-08-20 ENCOUNTER — Telehealth: Payer: Self-pay | Admitting: *Deleted

## 2010-08-20 DIAGNOSIS — J42 Unspecified chronic bronchitis: Secondary | ICD-10-CM | POA: Insufficient documentation

## 2010-08-20 MED ORDER — INSULIN LISPRO 100 UNIT/ML ~~LOC~~ SOLN: 5.0000 [IU] | Freq: Two times a day (BID) | SUBCUTANEOUS | Status: DC

## 2010-08-20 NOTE — Telephone Encounter (Signed)
I spoke w/ pt she is aware.  

## 2010-08-20 NOTE — Telephone Encounter (Signed)
Message copied by Charlies Silvers on Thu Aug 20, 2010 11:13 AM ------      Message from: Kathlene November E      Created: Thu Aug 20, 2010  8:00 AM       Fasting CBGs well controlled per amb CBGs.      Plan:      Check CBGs 2 hours after 2 heviest meals of the day      Start humalog 5 u s.q. Twice a day----------->  with the 2 heaviest meals of the day      Keep a log, bring a copy in 10 days      Renal fx still decreased , should improve after she d/c celebrex

## 2010-08-20 NOTE — Assessment & Plan Note (Signed)
We need to determine if she is just being oversensitive to normal mucus flows, or there is an inflammatory process in the nasopharynx and upper larynx/ trachea- particularly an allergic process.We will assess response to Qvar, claritin and send Allergy profile for IgE survey.

## 2010-09-01 ENCOUNTER — Telehealth: Payer: Self-pay | Admitting: Internal Medicine

## 2010-09-01 NOTE — Telephone Encounter (Signed)
Spoke w/ pt aware appt scheduled

## 2010-09-01 NOTE — Telephone Encounter (Signed)
Pt called to give list of bp and blood sugar readings Date        BS              BP 6/28          75  117/68 6/29      84  120/65 6/30      91  115/65 7/1     2hr p meal 126   112/65 7/2      95   112/62 7/3            110   116/69 7/4             79   125/65  7/5             129  114/66 7/6       107  114/67 7/7     90-started Humalog   129/67 7/8       74   125/68 7/9       92   119/70 Pt notes she didn't start Humalog until 7/7 since Mail order took to long to send medication.

## 2010-09-01 NOTE — Telephone Encounter (Signed)
Advise patient: Numbers look ok, plan  is the same. Let me know if she has low sugar symptoms. Office visit in 6 weeks with blood sugar readings

## 2010-09-17 ENCOUNTER — Encounter: Payer: Self-pay | Admitting: Internal Medicine

## 2010-09-17 ENCOUNTER — Ambulatory Visit (INDEPENDENT_AMBULATORY_CARE_PROVIDER_SITE_OTHER): Payer: Medicare Other | Admitting: Internal Medicine

## 2010-09-17 VITALS — BP 124/76 | HR 88 | Ht 64.75 in | Wt 238.2 lb

## 2010-09-17 DIAGNOSIS — J42 Unspecified chronic bronchitis: Secondary | ICD-10-CM

## 2010-09-17 DIAGNOSIS — J309 Allergic rhinitis, unspecified: Secondary | ICD-10-CM

## 2010-09-17 MED ORDER — BECLOMETHASONE DIPROPIONATE 40 MCG/ACT IN AERS: 2.0000 | INHALATION_SPRAY | Freq: Two times a day (BID) | RESPIRATORY_TRACT | Status: DC

## 2010-09-17 NOTE — Progress Notes (Signed)
Subjective:    Patient ID: Angel Green, female    DOB: [birth year], 70 y.o.   MRN: [id number]  HPI 08/12/10 -31 yoF never smoker, seen at kind request of Dr Larose Kells. Complains that for 3 years she has had excessive phlegm/ congestion in chest and throat. this is present most of the time. Nasal sprays reduce the drainage. Feels that this condition makes her weak and washed out. Symptoms worse in Spring and with weather changes. Seemed better while she was being treated for a cold. Denies dysphagia or reflux but has been told she has GERD. Nasal surgery-? Septoplasty?in past. Allergy skin tested twice with little positive response. Denies asthma, skin rash or relation to meals. Astelin and Flonase used intermittently.  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 Retired from [employer] work. CXR- neg, NAD 06/30/10  09/17/10- 08/12/10 -59 yoF never smoker,  Followed for allergic rhinitis, bronchitis. Complains that for 3 years she has had excessive phlegm/ congestion in chest and throat. Last visit we gave Qvar- big help; allegra- not much help by itself. 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 Lemonade drink mix makes phlegm come.   Review of Systems HPI Constitutional:   No weight loss, night sweats,  Fevers, chills, fatigue, lassitude.     Says she "doesn't feel pain normally" HEENT:   No headaches,  Difficulty swallowing,  Tooth/dental problems,  Sore throat,                No sneezing, itching, ear ache,  CV:  No chest pain, orthopnea, PND, swelling in lower extremities, anasarca, dizziness, palpitations  GI  No heartburn, indigestion, abdominal pain, nausea, vomiting, diarrhea, change in bowel habits, loss of appetite  Resp: No shortness of breath with exertion or at rest.  No excess mucus, no productive cough,  No non-productive cough,  No coughing up of blood.  No change in color of mucus.  No wheezing.    Skin: no rash or lesions.  GU: no dysuria, change in color of urine, no urgency  or frequency.  No flank pain.  MS:  No joint pain or swelling.  No decreased range of motion.  No back pain.  Psych:  No change in mood or affect. No depression or anxiety.  No memory loss.      Objective:   Physical Exam  General- Alert, Oriented, Affect-appropriate, Distress- none acute    obese Skin- rash-none, lesions- none, excoriation- none Lymphadenopathy- none Head- atraumatic            Eyes- Gross vision intact, PERRLA, conjunctivae clear secretions            Ears- Hearing, canals- normal            Nose- Clear, No-Septal dev, mucus, polyps, erosion, perforation             Throat- Mallampati II , mucosa clear , drainage- none, tonsils- atrophic Neck- flexible , trachea midline, no stridor , thyroid nl, carotid no bruit Chest - symmetrical excursion , unlabored           Heart/CV- RRR , no murmur , no gallop  , no rub, nl s1 s2                           - JVD- none , edema- none, stasis changes- none, varices- none           Lung- clear to P&A, wheeze- none, cough- none , dullness-none,  rub- none           Chest wall-  Abd- tender-no, distended-no, bowel sounds-present, HSM- no Br/ Gen/ Rectal- Not done, not indicated Extrem- cyanosis- none, clubbing, none, atrophy- none, strength- nl Neuro- grossly intact to observation           Assessment & Plan:

## 2010-09-17 NOTE — Assessment & Plan Note (Signed)
Qvar seemed to make a big difference for her. Explained med needs are likely to change as seasons change. We will resume Qvar 40 and use the lowest sufficient dose.

## 2010-09-17 NOTE — Patient Instructions (Signed)
Sample and script Qvar 40   Start out at 2 puffs, twice daily and rinse mouth well after use. If controlled and stable, then see if 2 puffs, once daily is sufficient.

## 2010-09-20 NOTE — Assessment & Plan Note (Signed)
Currently, in mid-summer- an occasional antihistamine is sufficient.

## 2010-09-28 ENCOUNTER — Other Ambulatory Visit: Payer: Self-pay | Admitting: Internal Medicine

## 2010-09-28 DIAGNOSIS — Z1231 Encounter for screening mammogram for malignant neoplasm of breast: Secondary | ICD-10-CM

## 2010-09-28 MED ORDER — PEN NEEDLES 31G X 6 MM MISC: Status: DC

## 2010-09-28 NOTE — Telephone Encounter (Signed)
rx sent to pharmacy

## 2010-10-01 ENCOUNTER — Telehealth: Payer: Self-pay | Admitting: *Deleted

## 2010-10-01 NOTE — Telephone Encounter (Signed)
error 

## 2010-10-06 ENCOUNTER — Ambulatory Visit (INDEPENDENT_AMBULATORY_CARE_PROVIDER_SITE_OTHER): Payer: Medicare Other | Admitting: Internal Medicine

## 2010-10-06 ENCOUNTER — Encounter: Payer: Self-pay | Admitting: Internal Medicine

## 2010-10-06 DIAGNOSIS — I10 Essential (primary) hypertension: Secondary | ICD-10-CM

## 2010-10-06 DIAGNOSIS — E119 Type 2 diabetes mellitus without complications: Secondary | ICD-10-CM

## 2010-10-06 DIAGNOSIS — R5381 Other malaise: Secondary | ICD-10-CM

## 2010-10-06 DIAGNOSIS — N189 Chronic kidney disease, unspecified: Secondary | ICD-10-CM

## 2010-10-06 DIAGNOSIS — R5383 Other fatigue: Secondary | ICD-10-CM

## 2010-10-06 LAB — BASIC METABOLIC PANEL
BUN: 13 mg/dL (ref 6–23)
Calcium: 9.9 mg/dL (ref 8.4–10.5)
Creatinine, Ser: 1.4 mg/dL — ABNORMAL HIGH (ref 0.4–1.2)
GFR: 49.5 mL/min — ABNORMAL LOW (ref >60.00)

## 2010-10-06 LAB — HEMOGLOBIN A1C: Hgb A1c MFr Bld: 6.9 % — ABNORMAL HIGH (ref 4.6–6.5)

## 2010-10-06 NOTE — Progress Notes (Signed)
  Subjective:    Patient ID: Angel Green, female    DOB: 12/17/1940, 70 y.o.   MRN: [id number]  HPI Routine office visit Diabetes--Based on the last hemoglobin A1c we started insulin. Good compliance, CBGs in AM ~ 90 to 120. No recent post prandial CBGs Respiratory symptoms-- saw pulmonary, was recommended to stay on Qvar, feels better w/ higher doses (med list corrected to reflect higher dose) HTN-- on no meds , amb BPs wnl   Past Medical History  Diagnosis Date  . Hypertension   . Diabetes mellitus, type 2   . Hyperlipidemia   . Osteoarthritis   . Ankle pain     chronic  . Rhinitis     allergic nos  . RENAL INSUFFICIENCY, CHRONIC 04/16/2010   Past Medical History  Diagnosis Date  . Hypertension   . Diabetes mellitus, type 2   . Hyperlipidemia   . Osteoarthritis   . Ankle pain     chronic  . Rhinitis     allergic nos  . RENAL INSUFFICIENCY, CHRONIC 04/16/2010      Review of Systems Doing well except for lack of energy, denies DOE per se but unable to walk for any distance because she gets tired and starts sweating. No exertional chest pain or shortness or breath , no orthopnea  No nausea, vomiting, diarrhea.     Objective:   Physical Exam  Constitutional: She is oriented to person, place, and time. She appears well-developed and well-nourished. No distress.  Cardiovascular: Normal rate, regular rhythm and normal heart sounds.   No murmur heard. Pulmonary/Chest: Effort normal and breath sounds normal. No respiratory distress. She has no wheezes. She has no rales.  Musculoskeletal: She exhibits no edema.  Neurological: She is alert and oriented to person, place, and time.  Skin: She is not diaphoretic.  Psychiatric: She has a normal mood and affect. Her behavior is normal. Judgment and thought content normal.          Assessment & Plan:  DIABETES : Seems to be doing better  with current therapy, labs  HYPERTENSION: At some point was on  Benicar or azor ,  BP remains wnl  Plan: observation, restart meds prn  RENAL INSUFFICIENCY, CHRONIC : Normal renal ultrasound 03-2010 30ff celebrex, Tylenol for pain.  Hopefully renal fx will be better  off COXi----> labs  Tracheobronchitis, chronic  Saw Dr Annamaria Boots , better on Qvair  FATIGUE Unclear etiology, see ROS. DDX includes deconditioning, angina equivalent, diastolic dysfunction, multifactorial No anemia, thyroid tests normal: observe for now. ADDENDUM:  patient really likes this to be investigated further. The chart is reviewed, essentially normal echocardiogram 0000000 ,+ mild diastolic dysfunction. Plan: Refer to cardiology, angina equivalent?

## 2010-10-12 ENCOUNTER — Encounter: Payer: Self-pay | Admitting: Cardiovascular Disease

## 2010-10-12 ENCOUNTER — Ambulatory Visit (INDEPENDENT_AMBULATORY_CARE_PROVIDER_SITE_OTHER): Payer: Medicare Other | Admitting: Cardiovascular Disease

## 2010-10-12 VITALS — BP 150/80 | HR 95 | Resp 12 | Ht 64.0 in | Wt 234.0 lb

## 2010-10-12 DIAGNOSIS — E119 Type 2 diabetes mellitus without complications: Secondary | ICD-10-CM

## 2010-10-12 DIAGNOSIS — R0600 Dyspnea, unspecified: Secondary | ICD-10-CM | POA: Insufficient documentation

## 2010-10-12 DIAGNOSIS — E785 Hyperlipidemia, unspecified: Secondary | ICD-10-CM

## 2010-10-12 DIAGNOSIS — I10 Essential (primary) hypertension: Secondary | ICD-10-CM

## 2010-10-12 DIAGNOSIS — R0609 Other forms of dyspnea: Secondary | ICD-10-CM

## 2010-10-12 DIAGNOSIS — R0989 Other specified symptoms and signs involving the circulatory and respiratory systems: Secondary | ICD-10-CM

## 2010-10-12 NOTE — Assessment & Plan Note (Signed)
Well controlled.  Continue current medications and low sodium Dash type diet.    

## 2010-10-12 NOTE — Progress Notes (Signed)
70 yo obese female with DM, HTN and elevated lipids.  Progressive fatigue and exertional dyspnea.  No previous cardiac problem.  Echo 2010 reviewed and normal with grade one diastolic dysfunction normal for age and history of HTN.  Difficulty standing for prolonged periods of time.  No history of PMR or fibromyalgia but symptoms are reminiscent of this.  No SSCP but exertional dyspnea is worse and in a diabetic may be anginal equivalent.  ECG abnormal wth moderate LVH.  Unable to walk long enough on treadmill and baseline abnormal ECG would necessitate lexiscan myovue to R/O anginal equivalent.  Will also need echo to assess RV/LV function for dyspnea. Compliant with meds.  Weight elevated but stable.  No chronic lung disease, cough sputum or wheezing.  No history of pulmonary hypertension.   ROS: Denies fever, malais, weight loss, blurry vision, decreased visual acuity, cough, sputum, SOB, hemoptysis, pleuritic pain, palpitaitons, heartburn, abdominal pain, melena, lower extremity edema, claudication, or rash.  All other systems reviewed and negative   General: Affect appropriate Healthy:  appears stated age 70: normal Neck supple with no adenopathy JVP normal no bruits no thyromegaly Lungs clear with no wheezing and good diaphragmatic motion Heart:  S1/S2 no murmur,rub, gallop or click PMI normal Abdomen: benighn, BS positve, no tenderness, no AAA no bruit.  No HSM or HJR Distal pulses intact with no bruits No edema Neuro non-focal Skin warm and dry No muscular weakness  Medications Current Outpatient Prescriptions  Medication Sig Dispense Refill  . acetaminophen (TYLENOL) 325 MG tablet Take 650 mg by mouth every 6 (six) hours as needed.        Marland Kitchen aspirin 81 MG tablet Take 81 mg by mouth daily.        Marland Kitchen atorvastatin (LIPITOR) 10 MG tablet Take 1 tablet (10 mg total) by mouth daily.  90 tablet  1  . azelastine (ASTELIN) 137 MCG/SPRAY nasal spray 2 sprays by Nasal route 2 (two) times  daily. Use in each nostril as directed       . beclomethasone (QVAR) 40 MCG/ACT inhaler Inhale 2 puffs into the lungs 2 (two) times daily. Rinse mouth  1 Inhaler  2  . Cholecalciferol (VITAMIN D3) 1000 UNITS CAPS Take by mouth.        . Cyanocobalamin (B-12 PO) Take 1 tablet by mouth daily.       . fexofenadine (ALLEGRA) 180 MG tablet Take 180 mg by mouth daily.        . fluticasone (FLONASE) 50 MCG/ACT nasal spray 2 sprays by Nasal route daily.        . furosemide (LASIX) 20 MG tablet Take 2 tablets (40 mg total) by mouth daily. Take 2 tablets daily  180 tablet  1  . insulin glargine (LANTUS) 100 UNIT/ML injection Inject 65 Units into the skin at bedtime.  30 mL  1  . insulin lispro (HUMALOG PEN) 100 UNIT/ML injection Inject 5 Units into the skin 2 (two) times daily with a meal. With 2 heaviest meals.  10 mL  1  . Insulin Pen Needle (PEN NEEDLES) 31G X 6 MM MISC Use as directed  100 each  1  . omeprazole (PRILOSEC) 20 MG capsule Take 1 capsule (20 mg total) by mouth daily.  90 capsule  1  . sitaGLIPtin (JANUVIA) 100 MG tablet Take 1 tablet (100 mg total) by mouth daily.  90 tablet  1    Allergies Review of patient's allergies indicates no known allergies.  Family History: Family  History  Problem Relation Age of Onset  . Heart attack Neg Hx   . Colon cancer Neg Hx   . Breast cancer Neg Hx     Social History: History   Social History  . Marital Status: Married    Spouse Name: N/A    Number of Children: 2  . Years of Education: N/A   Occupational History  . retired-banking business    Social History Main Topics  . Smoking status: Never Smoker   . Smokeless tobacco: Not on file  . Alcohol Use: No  . Drug Use: No  . Sexually Active: Not on file   Other Topics Concern  . Not on file   Social History Narrative   Regular exercise- yes: walking     Electrocardiogram:  NSR 95 LVH  Assessment and Plan

## 2010-10-12 NOTE — Assessment & Plan Note (Signed)
Cholesterol is at goal.  Continue current dose of statin and diet Rx.  No myalgias or side effects.  F/U  LFT's in 6 months. Lab Results  Component Value Date   LDLCALC 75 04/13/2010

## 2010-10-12 NOTE — Patient Instructions (Signed)
Your physician has requested that you have an echocardiogram. Echocardiography is a painless test that uses sound waves to create images of your heart. It provides your doctor with information about the size and shape of your heart and how well your heart's chambers and valves are working. This procedure takes approximately one hour. There are no restrictions for this procedure.   Your physician has requested that you have a lexiscan myoview. For further information please visit [email or web address]. Please follow instruction sheet, as given.

## 2010-10-12 NOTE — Assessment & Plan Note (Signed)
F/U Dr Larose Kells.  Target A1c 6.5 or less.  May be contributing to fatigue

## 2010-10-12 NOTE — Assessment & Plan Note (Signed)
Dyspnea and fatigue in diabetic with abnormal ECG.  F/U lexiscan myovue.  Overall I would be surprised if her fatigue and dyspnea are related to her heart

## 2010-10-19 ENCOUNTER — Ambulatory Visit (HOSPITAL_COMMUNITY): Payer: Medicare Other | Attending: Cardiovascular Disease | Admitting: Radiology

## 2010-10-19 VITALS — Ht 65.0 in | Wt 233.0 lb

## 2010-10-19 DIAGNOSIS — I10 Essential (primary) hypertension: Secondary | ICD-10-CM | POA: Insufficient documentation

## 2010-10-19 DIAGNOSIS — E119 Type 2 diabetes mellitus without complications: Secondary | ICD-10-CM | POA: Insufficient documentation

## 2010-10-19 DIAGNOSIS — R0989 Other specified symptoms and signs involving the circulatory and respiratory systems: Secondary | ICD-10-CM | POA: Insufficient documentation

## 2010-10-19 DIAGNOSIS — I059 Rheumatic mitral valve disease, unspecified: Secondary | ICD-10-CM | POA: Insufficient documentation

## 2010-10-19 DIAGNOSIS — R0602 Shortness of breath: Secondary | ICD-10-CM

## 2010-10-19 DIAGNOSIS — E785 Hyperlipidemia, unspecified: Secondary | ICD-10-CM | POA: Insufficient documentation

## 2010-10-19 DIAGNOSIS — R0600 Dyspnea, unspecified: Secondary | ICD-10-CM

## 2010-10-19 DIAGNOSIS — R0609 Other forms of dyspnea: Secondary | ICD-10-CM | POA: Insufficient documentation

## 2010-10-19 DIAGNOSIS — E669 Obesity, unspecified: Secondary | ICD-10-CM | POA: Insufficient documentation

## 2010-10-19 DIAGNOSIS — I079 Rheumatic tricuspid valve disease, unspecified: Secondary | ICD-10-CM | POA: Insufficient documentation

## 2010-10-19 MED ORDER — REGADENOSON 0.4 MG/5ML IV SOLN
0.4000 mg | Freq: Once | INTRAVENOUS | Status: AC
  Administered 2010-10-19: 0.4 mg via INTRAVENOUS

## 2010-10-19 MED ORDER — TECHNETIUM TC 99M TETROFOSMIN IV KIT
11.0000 | PACK | Freq: Once | INTRAVENOUS | Status: AC | PRN
  Administered 2010-10-19: 11 via INTRAVENOUS

## 2010-10-19 MED ORDER — TECHNETIUM TC 99M TETROFOSMIN IV KIT
33.0000 | PACK | Freq: Once | INTRAVENOUS | Status: AC | PRN
  Administered 2010-10-19: 33 via INTRAVENOUS

## 2010-10-19 NOTE — Progress Notes (Signed)
[facility] [place] [address] [phone]  Cardiology Nuclear Med Study  Angel Green is a 70 y.o. female [id number] 11/18/1940   Nuclear Med Background Indication for Stress Test:  Evaluation for Ischemia and Abnormal EKG (moderate LVH) History: '10 Echo:  Cardiac Risk Factors: Hypertension, IDDM Type 2, Lipids and Obesity  Symptoms:  DOE, Fatigue, Palpitations and SOB   Nuclear Pre-Procedure Caffeine/Decaff Intake:  None NPO After: 8:00am   Lungs: clear IV 0.9% NS with Angio Cath:  22g  IV Site: L Antecubital  IV Started by:  Irven Baltimore, RN  Chest Size (in):  42 Cup Size: B  Height: 5\' 5"  (1.651 m)  Weight:  233 lb (105.688 kg)  BMI:  Body mass index is 38.77 kg/(m^2). Tech Comments:  Patient took full dose of insulin today with oatmeal. 5 hr pc BS here at 12:50pm was 82    Nuclear Med Study 1 or 2 day study: 1 day  Stress Test Type:  Carlton Adam  Reading MD: Dola Argyle, MD  Order Authorizing Provider:  P.Nishan  Resting Radionuclide: Technetium 52m Tetrofosmin  Resting Radionuclide Dose: 11.0 mCi   Stress Radionuclide:  Technetium 30m Tetrofosmin  Stress Radionuclide Dose: 33.0 mCi           Stress Protocol Rest HR: 76 Stress HR: 95  Rest BP: 154/82 Stress BP: 15565  Exercise Time (min): n/a METS: n/a   Predicted Max HR: 148 bpm % Max HR: 64.19 bpm Rate Pressure Product: 14725   Dose of Adenosine (mg):  n/a Dose of Lexiscan: 0.4 mg  Dose of Atropine (mg): n/a Dose of Dobutamine: n/a mcg/kg/min (at max HR)  Stress Test Technologist: Perrin Maltese, EMT-P  Nuclear Technologist:  Annye Rusk, CNMT     Rest Procedure:  Myocardial perfusion imaging was performed at rest 45 minutes following the intravenous administration of Technetium 39m Tetrofosmin. Rest ECG: NSR  Stress Procedure:  The patient received IV Lexiscan 0.4 mg over 15-seconds.  Technetium 39m Tetrofosmin injected at 30-seconds.  There  were no significant changes with Lexiscan.  Quantitative spect images were obtained after a 45 minute delay. Stress ECG: No significant change from baseline ECG  QPS Raw Data Images:  Patient motion noted; appropriate software correction applied. Stress Images:  Normal homogeneous uptake in all areas of the myocardium. Rest Images:  Normal homogeneous uptake in all areas of the myocardium. Subtraction (SDS):  No evidence of ischemia. Transient Ischemic Dilatation (Normal <1.22):  0.98 Lung/Heart Ratio (Normal <0.45):  0.31  Quantitative Gated Spect Images QGS EDV:  69 ml QGS ESV:  25 ml QGS cine images:  Normal Wall Motion QGS EF: 63%  Impression Exercise Capacity:  Lexiscan with no exercise. BP Response:  Normal blood pressure response. Clinical Symptoms:  SOB ECG Impression:  No significant ST segment change suggestive of ischemia. Comparison with Prior Nuclear Study: No previous nuclear study performed  Overall Impression:  Normal stress nuclear study.  Dola Argyle

## 2010-11-05 ENCOUNTER — Ambulatory Visit (HOSPITAL_COMMUNITY)
Admission: RE | Admit: 2010-11-05 | Discharge: 2010-11-05 | Disposition: A | Discharge status: Home / Self Care | Payer: Medicare Other | Source: Ambulatory Visit | Attending: Internal Medicine | Admitting: Internal Medicine

## 2010-11-05 DIAGNOSIS — Z1231 Encounter for screening mammogram for malignant neoplasm of breast: Secondary | ICD-10-CM | POA: Insufficient documentation

## 2010-11-06 ENCOUNTER — Ambulatory Visit (INDEPENDENT_AMBULATORY_CARE_PROVIDER_SITE_OTHER): Payer: Medicare Other | Admitting: Internal Medicine

## 2010-11-06 ENCOUNTER — Encounter: Payer: Self-pay | Admitting: Internal Medicine

## 2010-11-06 DIAGNOSIS — J069 Acute upper respiratory infection, unspecified: Secondary | ICD-10-CM

## 2010-11-06 MED ORDER — AZITHROMYCIN 250 MG PO TABS: ORAL_TABLET | ORAL | Status: AC

## 2010-11-06 MED ORDER — HYDROCODONE-HOMATROPINE 5-1.5 MG/5ML PO SYRP: 5.0000 mL | ORAL_SOLUTION | Freq: Four times a day (QID) | ORAL | Status: AC | PRN

## 2010-11-06 MED ORDER — PEN NEEDLES 31G X 6 MM MISC: Status: DC

## 2010-11-06 NOTE — Patient Instructions (Signed)
Rest, fluids , tylenol For cough, take Mucinex DM twice a day as needed  If cough severe , use the  Hydrocodone  Syrup , will make you drowsy Call if no better in few days start a zpack (antibiotic) Call anytime if the symptoms are severe, you have high fever, short of breath

## 2010-11-06 NOTE — Progress Notes (Signed)
  Subjective:    Patient ID: Angel Green, female    DOB: 03-Sep-1940, 70 y.o.   MRN: [id number]  HPI 6 days h/o URI type of sx: Dry cough, throat congestion  Past Medical History  Diagnosis Date  . Hypertension   . Diabetes mellitus, type 2   . Hyperlipidemia   . Osteoarthritis   . Ankle pain     chronic  . Rhinitis     allergic nos  . RENAL INSUFFICIENCY, CHRONIC 04/16/2010   Past Surgical History  Procedure Date  . Cholecystectomy   . Partial hysterectomy     in her 30s     Review of Systems No F-C No chest congestion, mild sputum production, thick, no bloody No nasal d/c or sinus pain    Objective:   Physical Exam  Constitutional: She appears well-developed and well-nourished.  HENT:  Head: Normocephalic and atraumatic.  Right Ear: External ear normal.  Left Ear: External ear normal.       Throat not red Nose slt congested   Cardiovascular: Normal rate, regular rhythm and normal heart sounds.   No murmur heard. Pulmonary/Chest: Effort normal and breath sounds normal. No respiratory distress. She has no wheezes. She has no rales.          Assessment & Plan:  URI, early bronchitis? See instructions

## 2010-11-11 ENCOUNTER — Other Ambulatory Visit: Payer: Self-pay | Admitting: Internal Medicine

## 2010-11-11 NOTE — Telephone Encounter (Signed)
ERROR

## 2010-11-23 ENCOUNTER — Other Ambulatory Visit: Payer: Self-pay | Admitting: *Deleted

## 2010-11-23 MED ORDER — PEN NEEDLES 31G X 6 MM MISC: Status: DC

## 2010-12-11 ENCOUNTER — Telehealth: Payer: Self-pay | Admitting: *Deleted

## 2010-12-11 NOTE — Telephone Encounter (Signed)
LEFT MESSAGE WITH  HUSBAND  FOR PT TO CALL BACK RE ECHO AND MYOVIEW RESULTS FROM 10-19-10./CY

## 2010-12-15 ENCOUNTER — Telehealth: Payer: Self-pay

## 2010-12-15 ENCOUNTER — Ambulatory Visit: Payer: Medicare Other | Admitting: Internal Medicine

## 2010-12-15 MED ORDER — INSULIN GLARGINE 100 UNIT/ML ~~LOC~~ SOLN: 65.0000 [IU] | Freq: Every day | SUBCUTANEOUS | Status: DC

## 2010-12-15 NOTE — Telephone Encounter (Signed)
Refill request for lantus.  done

## 2010-12-18 ENCOUNTER — Encounter: Payer: Self-pay | Admitting: Internal Medicine

## 2010-12-18 ENCOUNTER — Ambulatory Visit (INDEPENDENT_AMBULATORY_CARE_PROVIDER_SITE_OTHER): Payer: Medicare Other | Admitting: Internal Medicine

## 2010-12-18 VITALS — BP 120/86 | HR 97 | Ht 64.75 in | Wt 238.4 lb

## 2010-12-18 DIAGNOSIS — J42 Unspecified chronic bronchitis: Secondary | ICD-10-CM

## 2010-12-18 DIAGNOSIS — J309 Allergic rhinitis, unspecified: Secondary | ICD-10-CM

## 2010-12-18 MED ORDER — BECLOMETHASONE DIPROPIONATE 40 MCG/ACT IN AERS: 2.0000 | INHALATION_SPRAY | Freq: Two times a day (BID) | RESPIRATORY_TRACT | Status: DC

## 2010-12-18 NOTE — Patient Instructions (Signed)
Script for Qvar 40  Please call as needed for refills etc.

## 2010-12-18 NOTE — Progress Notes (Signed)
Patient ID: Angel Green, female    DOB: 01-05-1941, 70 y.o.   MRN: [id number]  HPI 08/12/10 -38 yoF never smoker, seen at kind request of Dr Larose Kells. Complains that for 3 years she has had excessive phlegm/ congestion in chest and throat. this is present most of the time. Nasal sprays reduce the drainage. Feels that this condition makes her weak and washed out. Symptoms worse in Spring and with weather changes. Seemed better while she was being treated for a cold. Denies dysphagia or reflux but has been told she has GERD. Nasal surgery-? Septoplasty?in past. Allergy skin tested twice with little positive response. Denies asthma, skin rash or relation to meals. Astelin and Flonase used intermittently.  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 Retired from [employer] work. CXR- neg, NAD 06/30/10  09/17/10- 08/12/10 -75 yoF never smoker,  Followed for allergic rhinitis, bronchitis. Complains that for 3 years she has had excessive phlegm/ congestion in chest and throat. Last visit we gave Qvar- big help; allegra- not much help by itself. 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 Lemonade drink mix makes phlegm come.   12/18/10--70 yoF never smoker,  Followed for allergic rhinitis, bronchitis. Complains that for 3 years she has had excessive phlegm/ congestion in chest and throat. Had flu vaccine. No acute problems since last here and feels well. Minor sniffling just in the last day but no cough, chest pain or significant sinus discomfort. She continues Qvar 40.  Review of Systems HPI Constitutional:   No-   weight loss, night sweats, fevers, chills, fatigue, lassitude. HEENT:   No-  headaches, difficulty swallowing, tooth/dental problems, sore throat,       No-  sneezing, itching, ear ache, +nasal congestion, post nasal drip,  CV:  No-   chest pain, orthopnea, PND, swelling in lower extremities, anasarca, dizziness, palpitations Resp: No-   shortness of breath with exertion or at rest.        No-   productive cough,  No non-productive cough,  No- coughing up of blood.              No-   change in color of mucus.  No- wheezing.   Skin: No-   rash or lesions. GI:  No-   heartburn, indigestion, abdominal pain, nausea, vomiting, diarrhea,                 change in bowel habits, loss of appetite GU: No-   dysuria, change in color of urine, no urgency or frequency.  No- flank pain. MS:  No-   joint pain or swelling.  No- decreased range of motion.  No- back pain. Neuro-     nothing unusual Psych:  No- change in mood or affect. No depression or anxiety.  No memory loss.      Objective:   Physical Exam  General- Alert, Oriented, Affect-appropriate, Distress- none acute    obese Skin- rash-none, lesions- none, excoriation- none Lymphadenopathy- none Head- atraumatic            Eyes- Gross vision intact, PERRLA, conjunctivae clear secretions            Ears- Hearing, canals- normal            Nose- Clear, No-Septal dev, mucus, polyps, erosion, perforation             Throat- Mallampati II , mucosa clear , drainage- none, tonsils- atrophic Neck- flexible , trachea midline, no stridor , thyroid nl, carotid no  bruit Chest - symmetrical excursion , unlabored           Heart/CV- RRR , no murmur , no gallop  , no rub, nl s1 s2                           - JVD- none , edema- none, stasis changes- none, varices- none           Lung- clear to P&A, wheeze- none, cough- none , dullness-none, rub- none           Chest wall-  Abd- tender-no, distended-no, bowel sounds-present, HSM- no Br/ Gen/ Rectal- Not done, not indicated Extrem- cyanosis- none, clubbing, none, atrophy- none, strength- nl Neuro- grossly intact to observation

## 2010-12-20 NOTE — Assessment & Plan Note (Signed)
Qvar has done a good job of stabilizing. She will continue this.

## 2010-12-20 NOTE — Assessment & Plan Note (Signed)
Symptomatic therapy has been sufficient.

## 2011-01-07 ENCOUNTER — Telehealth: Payer: Self-pay | Admitting: *Deleted

## 2011-01-07 NOTE — Telephone Encounter (Signed)
Pt called stating FBS usually runs 90-100. This am FBS is 63. Pt currently taking 65 units Lantus at bedtime and 5 units Humalog twice a day. Pt states she was told to call if BS is less than 70. Pt states she has lemonade and will drink 1 glass and proceed with breakfast. Pt instructed to check BS in 15 minutes to ensure it is normalizing and to call if BS does not increase. Please advise.

## 2011-01-07 NOTE — Telephone Encounter (Signed)
Pt.notified

## 2011-01-07 NOTE — Telephone Encounter (Signed)
No change for now, if she is having hypoglycemia more than twice a week, let me know, we will need to adjust her regimen.

## 2011-01-22 ENCOUNTER — Telehealth: Payer: Self-pay

## 2011-01-22 NOTE — Telephone Encounter (Signed)
Pt called to give Fasting blood sugar and BP readings for the past 2 weeks:  Nov 16-Nov 30:  62 144/88 70 144/83 61 132/77 68 147/82 64 145/85 87 145/82 70 145/82 64 136/82 66 132/78 65 133/82 70 131/84 68 144/80 68 146/92 78 135/79 80 136/85

## 2011-01-25 NOTE — Telephone Encounter (Signed)
Pt aware and verbalized understanding. Pt will call with readings in about 2 weeks.

## 2011-01-25 NOTE — Telephone Encounter (Signed)
Decrease lantus from 65 to 62 units

## 2011-02-03 ENCOUNTER — Ambulatory Visit (INDEPENDENT_AMBULATORY_CARE_PROVIDER_SITE_OTHER): Payer: Medicare Other | Admitting: Internal Medicine

## 2011-02-03 VITALS — BP 150/86 | HR 86 | Temp 98.4°F | Ht 65.25 in | Wt 235.0 lb

## 2011-02-03 DIAGNOSIS — E119 Type 2 diabetes mellitus without complications: Secondary | ICD-10-CM

## 2011-02-03 DIAGNOSIS — I10 Essential (primary) hypertension: Secondary | ICD-10-CM

## 2011-02-03 LAB — BASIC METABOLIC PANEL
BUN: 15 mg/dL (ref 6–23)
Calcium: 9.8 mg/dL (ref 8.4–10.5)
GFR: 42.16 mL/min — ABNORMAL LOW (ref >60.00)
Glucose, Bld: 88 mg/dL (ref 70–99)
Potassium: 4.5 mEq/L (ref 3.5–5.1)

## 2011-02-03 MED ORDER — ATORVASTATIN CALCIUM 10 MG PO TABS: 10.0000 mg | ORAL_TABLET | Freq: Every day | ORAL | Status: DC

## 2011-02-03 MED ORDER — OMEPRAZOLE 20 MG PO CPDR: 20.0000 mg | DELAYED_RELEASE_CAPSULE | Freq: Every day | ORAL | Status: DC

## 2011-02-03 MED ORDER — FUROSEMIDE 20 MG PO TABS: 40.0000 mg | ORAL_TABLET | Freq: Every day | ORAL | Status: DC

## 2011-02-03 NOTE — Assessment & Plan Note (Signed)
No change, labs

## 2011-02-03 NOTE — Progress Notes (Signed)
  Subjective:    Patient ID: Angel Green, female    DOB: [birth year], 70 y.o.   MRN: [id number]  HPI Since the last office visit, her blood sugars were dropping in the 60s in the morning, we decrease her Lantus to 63 units. Currently blood sugars in the morning are in the 80s, patient is still concerned that CBGs are too low. 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 She appears well-developed and well-nourished.  Cardiovascular: Normal rate, regular rhythm and normal heart sounds.   No murmur heard. Pulmonary/Chest: Effort normal and breath sounds normal. No respiratory distress. She has no wheezes. She has no rales.  Musculoskeletal: She exhibits no edema.  Neurological: She is alert and oriented to person, place, and time.       Assessment & Plan:

## 2011-02-03 NOTE — Assessment & Plan Note (Addendum)
We decreased lantus to 63 units, CBGs no longer < 60 in AM, pt still concerned as they are still in the 80s Also today she reports that take Humalog before breakfast and before bedtime,the before bedtime Humalog may ccount for some of the low numbers in the morning . Plan: To take Humalog before breakfast and before dinner Decrease lantus to 60, check CBG before meals so we can adjust humalog. See instructions

## 2011-02-03 NOTE — Patient Instructions (Signed)
Check your sugar in the morning and also before lunch and before bedtime Call with readings in 2 weeks

## 2011-02-19 ENCOUNTER — Telehealth: Payer: Self-pay

## 2011-02-19 NOTE — Telephone Encounter (Signed)
Decrease Lantus from 60 to 55 units. Increase Humalog from 5 to 8 units before her heaviest meals of the day Call with readings in 2 weeks

## 2011-02-19 NOTE — Telephone Encounter (Signed)
Called with CBG's     Date     Morning        Lunch        Dinner             12/14       75       179                     12/15  106               146            172             12/16    83               142      187                                    12/17    65               105            128                                   12/18        78               123            175                                   12/19        74               164            130                                   12/20                                             138                                   12/21        91               177            134                                   12/22        73               104            160             12/23        86  167     118               12/24        84            221            188  Currently taking Humalog 5 units Bid, Lantus 60 units at bedtime and Januvia 100 mg qd. Please advise     KP

## 2011-02-19 NOTE — Telephone Encounter (Signed)
Discussed with patient and medication have been updated on her medication list.     KP

## 2011-03-05 ENCOUNTER — Telehealth: Payer: Self-pay

## 2011-03-05 NOTE — Telephone Encounter (Signed)
Pt is going out of town tomorrow and would like to know if any medications will be changed:  Pt called to give readings:     Breakfast Lunch  Dinner  12/29  74  138  130  12/30  73  105  174   12/31  83  129  146  1/1  75  125  196  1/2  78  92  142 1/3  82  124  192 1/4  89  68  149 1/5  76  114  98 1/6  87  139  141  1/7  70  119  137  1/8  67  113  203 1/9  70  141  123 1/10  60  95  201 1/11  78

## 2011-03-05 NOTE — Telephone Encounter (Signed)
Decrease Lantus to 50, increase Humalog to 8 units before the  2 heaviest  meals of the day (will be in 3 shots a day instead of 2 shots)

## 2011-03-05 NOTE — Telephone Encounter (Signed)
Pt aware.

## 2011-03-17 DIAGNOSIS — M79609 Pain in unspecified limb: Secondary | ICD-10-CM | POA: Diagnosis not present

## 2011-03-17 DIAGNOSIS — E109 Type 1 diabetes mellitus without complications: Secondary | ICD-10-CM | POA: Diagnosis not present

## 2011-03-17 DIAGNOSIS — L608 Other nail disorders: Secondary | ICD-10-CM | POA: Diagnosis not present

## 2011-03-17 DIAGNOSIS — B351 Tinea unguium: Secondary | ICD-10-CM | POA: Diagnosis not present

## 2011-03-29 ENCOUNTER — Telehealth: Payer: Self-pay | Admitting: *Deleted

## 2011-03-29 ENCOUNTER — Telehealth: Payer: Self-pay

## 2011-03-29 MED ORDER — INSULIN GLARGINE 100 UNIT/ML ~~LOC~~ SOLN: 45.0000 [IU] | Freq: Every day | SUBCUTANEOUS | Status: DC

## 2011-03-29 MED ORDER — INSULIN GLARGINE 100 UNIT/ML ~~LOC~~ SOLN: 55.0000 [IU] | Freq: Every day | SUBCUTANEOUS | Status: DC

## 2011-03-29 MED ORDER — INSULIN LISPRO 100 UNIT/ML ~~LOC~~ SOLN: 8.0000 [IU] | Freq: Two times a day (BID) | SUBCUTANEOUS | Status: DC

## 2011-03-29 NOTE — Telephone Encounter (Signed)
Call from patient and she was calling to check the status on her handicapped sticker, she also needed refills on Lantus and [facility]alog [facility] She also wants to know the status of the CBG's that was given to [place] and what dr.[name] would like for her to do. Please advise    KP

## 2011-03-29 NOTE — Telephone Encounter (Signed)
Faxed pt's med refills & mailed Handicapped.

## 2011-03-29 NOTE — Telephone Encounter (Signed)
Refill done.  

## 2011-03-29 NOTE — Telephone Encounter (Signed)
Called with CBG's  Breakfast Lunch            Dinner 1.13 79  150  97 1.14 70  105  112 1.15 70  180  121  1.16 73  101  97 1.17 78  188  150 1.18 75  157  139  1.19 74  121  133 1.20 66  93  98  1.21 68  92  165  1.22 72  139  239 1.23 73  133  136 1.24 78  123  148  1.25 79  139  128 1.26 80  104  147 1.27 80  97  124 1.28 76  115  137 1.29 100  130  129 1.30 102

## 2011-03-29 NOTE — Telephone Encounter (Signed)
Addended by: Douglass Rivers T on: 03/29/2011 01:33 PM   Modules accepted: Orders

## 2011-03-29 NOTE — Telephone Encounter (Signed)
Decrease Lantus to 45 units at nighttime continue with all other meds as before.

## 2011-03-30 NOTE — Telephone Encounter (Signed)
Done

## 2011-04-09 ENCOUNTER — Other Ambulatory Visit: Payer: Self-pay | Admitting: *Deleted

## 2011-04-09 MED ORDER — FUROSEMIDE 20 MG PO TABS: 40.0000 mg | ORAL_TABLET | Freq: Every day | ORAL | Status: DC

## 2011-04-09 MED ORDER — SITAGLIPTIN PHOSPHATE 100 MG PO TABS: 100.0000 mg | ORAL_TABLET | Freq: Every day | ORAL | Status: DC

## 2011-04-09 MED ORDER — OMEPRAZOLE 20 MG PO CPDR: 20.0000 mg | DELAYED_RELEASE_CAPSULE | Freq: Every day | ORAL | Status: DC

## 2011-04-09 MED ORDER — ATORVASTATIN CALCIUM 10 MG PO TABS: 10.0000 mg | ORAL_TABLET | Freq: Every day | ORAL | Status: DC

## 2011-04-09 NOTE — Telephone Encounter (Signed)
Refill done.  

## 2011-05-04 ENCOUNTER — Encounter: Payer: Self-pay | Admitting: Internal Medicine

## 2011-05-04 ENCOUNTER — Ambulatory Visit (INDEPENDENT_AMBULATORY_CARE_PROVIDER_SITE_OTHER): Payer: Medicare Other | Admitting: Internal Medicine

## 2011-05-04 VITALS — BP 128/86 | HR 89 | Temp 98.4°F | Wt 238.0 lb

## 2011-05-04 DIAGNOSIS — M159 Polyosteoarthritis, unspecified: Secondary | ICD-10-CM

## 2011-05-04 DIAGNOSIS — E785 Hyperlipidemia, unspecified: Secondary | ICD-10-CM

## 2011-05-04 DIAGNOSIS — E119 Type 2 diabetes mellitus without complications: Secondary | ICD-10-CM

## 2011-05-04 NOTE — Assessment & Plan Note (Signed)
Diabetes, seems to be well-controlled, she reports occasional sweats, doubt they're related to low sugar. I recommend her to check her blood sugar whenever she feels that way. We'll check an A1c

## 2011-05-04 NOTE — Assessment & Plan Note (Signed)
Hyperlipidemia, due for a cholesterol panel, good medication compliance.

## 2011-05-04 NOTE — Assessment & Plan Note (Signed)
  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 Plan: Pain control with Tylenol, avoid COX-NSAIDs d/t  CRI. Declined stronger pain medicine

## 2011-05-04 NOTE — Patient Instructions (Signed)
caem back fasting: FLP-- dx hyperlipidemia A1C-- dx DM Check your blood sugar when you feel sweaty , if the sugar is less than 75, sweats may be related to low sugar

## 2011-05-04 NOTE — Progress Notes (Signed)
  Subjective:    Patient ID: Angel Green, female    DOB: 1941-01-15, 71 y.o.   MRN: [id number]  HPI Routine office visit In general feels well, blood sugars in the morning around 70-75, later on in the usually 90, rarely 180. Still has hand pain, the hands feel puffy, can not  completely flex the third right finger.  Past Medical History:  HYPERTENSION  DIABETES MELLITUS, TYPE II  HYPERLIPIDEMIA  CRI (chronic renal insufficiency)  OSTEOARTHROSIS, GENERALIZED, MULTIPLE SITES  ANKLE PAIN, CHRONIC  RHINITIS, ALLERGIC NOS   Past Surgical History:  Cholecystectomy  Hysterectomy, partial in her 28s    Review of Systems I asked her about low blood sugar symptoms, she reports that occasionally get sweaty usually when she's busy cooking. Symptoms are not associated with shortness of breath, chest pain, nausea or fatigue. Has not checked her blood sugars whenever she feels sweaty. Medication list is reviewed it,  good compliance    Objective:   Physical Exam Alert oriented x3 Hands: Symmetric, wrists normal, fingers we PIPs slightly large but not actually red to/warm. The third right finger indeed has slightly decreased flexion. Good capillary refill throughout.       Assessment & Plan:

## 2011-05-10 ENCOUNTER — Other Ambulatory Visit (INDEPENDENT_AMBULATORY_CARE_PROVIDER_SITE_OTHER): Payer: Medicare Other

## 2011-05-10 DIAGNOSIS — E785 Hyperlipidemia, unspecified: Secondary | ICD-10-CM

## 2011-05-10 DIAGNOSIS — E119 Type 2 diabetes mellitus without complications: Secondary | ICD-10-CM | POA: Diagnosis not present

## 2011-05-10 LAB — LIPID PANEL
Cholesterol: 141 mg/dL (ref 0–200)
LDL Cholesterol: 60 mg/dL (ref 0–99)
Triglycerides: 94 mg/dL (ref 0.0–149.0)
VLDL: 18.8 mg/dL (ref 0.0–40.0)

## 2011-05-12 ENCOUNTER — Encounter: Payer: Self-pay | Admitting: *Deleted

## 2011-05-17 ENCOUNTER — Telehealth: Payer: Self-pay | Admitting: Internal Medicine

## 2011-05-17 MED ORDER — BECLOMETHASONE DIPROPIONATE 40 MCG/ACT IN AERS: 2.0000 | INHALATION_SPRAY | Freq: Two times a day (BID) | RESPIRATORY_TRACT | Status: DC

## 2011-05-17 NOTE — Telephone Encounter (Signed)
I spoke with pt and she stated she needed [facility] bc [facility] is [facility] for her. I advised will send rx and nothing further was needed

## 2011-05-26 DIAGNOSIS — E109 Type 1 diabetes mellitus without complications: Secondary | ICD-10-CM | POA: Diagnosis not present

## 2011-05-26 DIAGNOSIS — L608 Other nail disorders: Secondary | ICD-10-CM | POA: Diagnosis not present

## 2011-05-26 DIAGNOSIS — L600 Ingrowing nail: Secondary | ICD-10-CM | POA: Diagnosis not present

## 2011-05-26 DIAGNOSIS — M79609 Pain in unspecified limb: Secondary | ICD-10-CM | POA: Diagnosis not present

## 2011-06-15 ENCOUNTER — Ambulatory Visit (INDEPENDENT_AMBULATORY_CARE_PROVIDER_SITE_OTHER): Payer: Medicare Other | Admitting: Internal Medicine

## 2011-06-15 ENCOUNTER — Ambulatory Visit (INDEPENDENT_AMBULATORY_CARE_PROVIDER_SITE_OTHER)
Admission: RE | Admit: 2011-06-15 | Discharge: 2011-06-15 | Disposition: A | Discharge status: Home / Self Care | Payer: Medicare Other | Source: Ambulatory Visit | Attending: Internal Medicine | Admitting: Internal Medicine

## 2011-06-15 VITALS — BP 148/96 | HR 85 | Temp 98.1°F | Wt 238.0 lb

## 2011-06-15 DIAGNOSIS — R059 Cough, unspecified: Secondary | ICD-10-CM

## 2011-06-15 DIAGNOSIS — I10 Essential (primary) hypertension: Secondary | ICD-10-CM

## 2011-06-15 DIAGNOSIS — R05 Cough: Secondary | ICD-10-CM

## 2011-06-15 MED ORDER — HYDROCODONE-HOMATROPINE 5-1.5 MG/5ML PO SYRP: 5.0000 mL | ORAL_SOLUTION | Freq: Three times a day (TID) | ORAL | Status: DC | PRN

## 2011-06-15 MED ORDER — DOXYCYCLINE HYCLATE 100 MG PO TABS: 100.0000 mg | ORAL_TABLET | Freq: Two times a day (BID) | ORAL | Status: DC

## 2011-06-15 NOTE — Progress Notes (Signed)
  Subjective:    Patient ID: Angel Green, female    DOB: [birth year], 71 y.o.   MRN: [id number]  HPI Acute visit Symptoms started about 2 weeks ago with cough, head and chest congestion. She's taking Mucinex DM over-the-counter, she also had a Z-Pak left over, she took it last week. She's not improving, cough is still very intense and persistent.  Past Medical History:  HYPERTENSION  DIABETES MELLITUS, TYPE II  HYPERLIPIDEMIA  CRI (chronic renal insufficiency)  OSTEOARTHROSIS, GENERALIZED, MULTIPLE SITES  ANKLE PAIN, CHRONIC  RHINITIS, ALLERGIC NOS  Past Surgical History:  Cholecystectomy  Hysterectomy, partial in her 63s   Review of Systems Had subjective fever last week, some chills. No nausea, vomiting, diarrhea She denies any blood in the sputum but at some point the sputum look reddish (rusty?) and greene . BP is slightly elevated, she is holding Lasix for a few days because with the cough she has urine incontinence. Ambulatory blood sugars remained normal Initially have some wheezing, that is resolved.    Objective:   Physical Exam General -- alert, well-developed, and overweight appearing. No apparent distress.  Neck --no LADs HEENT -- TMs normal, throat w/o redness, face symmetric and not tender to palpation, nose slt  Congested. Lungs -- normal respiratory effort, no intercostal retractions, no accessory muscle use, and a few rhonchi. No wheezing or increased work of breathing.  Heart-- normal rate, regular rhythm, no murmur, and no gallop.   Extremities-- no pretibial edema bilaterally  Neurologic-- alert & oriented X3 and strength normal in all extremities. Psych-- Cognition and judgment appear intact. Alert and cooperative with normal attention span and concentration.  not anxious appearing and not depressed appearing.         Assessment & Plan:

## 2011-06-15 NOTE — Patient Instructions (Signed)
Please get your x-ray at the other [facility]  office located at: [place], across from [facility].  Please go to the basement, this is a walk-in facility, they are open from 8:30 to 5:30 PM. Phone number [phone]. ---------------------------------------- Rest, fluids , tylenol For cough, take Mucinex DM twice a day as needed  For persistent cough use hydrocodone as needed, we'll make you drowsy. Take the antibiotic as prescribed  (doxycycline) Call if no better in few days Call anytime if the symptoms are severe, you have high fever, short of breath  ------------------------- Go back to Lasix as soon as you can, the BP slightly elevated today

## 2011-06-16 ENCOUNTER — Encounter: Payer: Self-pay | Admitting: Internal Medicine

## 2011-06-16 DIAGNOSIS — R05 Cough: Secondary | ICD-10-CM | POA: Insufficient documentation

## 2011-06-16 DIAGNOSIS — R059 Cough, unspecified: Secondary | ICD-10-CM | POA: Insufficient documentation

## 2011-06-16 NOTE — Assessment & Plan Note (Signed)
New problem Diabetic patient presents with cough for 2 weeks, she self started a Z-Pak, at some point she had fever and "a rusty sputum". She doesn't look toxic but a pneumonia needs to be rule out. Plan: Chest x-ray, cough suppression with hydrocodone, doxycycline. See instructions

## 2011-06-16 NOTE — Assessment & Plan Note (Signed)
BP slightly elevated today, for the last few days she has not taken Lasix due to urinary incontinence. BP usually better. No change for now, recommend to go back to  Lasix as soon as possible.

## 2011-06-30 ENCOUNTER — Ambulatory Visit (INDEPENDENT_AMBULATORY_CARE_PROVIDER_SITE_OTHER): Payer: Medicare Other | Admitting: Internal Medicine

## 2011-06-30 VITALS — BP 142/90 | HR 86 | Temp 97.9°F | Wt 235.0 lb

## 2011-06-30 DIAGNOSIS — R059 Cough, unspecified: Secondary | ICD-10-CM

## 2011-06-30 DIAGNOSIS — R05 Cough: Secondary | ICD-10-CM | POA: Diagnosis not present

## 2011-06-30 MED ORDER — HYDROCODONE-HOMATROPINE 5-1.5 MG/5ML PO SYRP: 5.0000 mL | ORAL_SOLUTION | Freq: Three times a day (TID) | ORAL | Status: DC | PRN

## 2011-06-30 MED ORDER — DOXYCYCLINE HYCLATE 100 MG PO TABS: 100.0000 mg | ORAL_TABLET | Freq: Two times a day (BID) | ORAL | Status: AC

## 2011-06-30 NOTE — Patient Instructions (Signed)
5 additional days of antibiotics Take Allegra every day along with all the other allergy medicines. For cough take Robitussin-DM or Mucinex DM as needed. If the cough continue, use hydrocodone

## 2011-06-30 NOTE — Assessment & Plan Note (Addendum)
Persisting cough, still producing some sputum. Chest x-ray was negative. This could be a persistent atypical infection versus post inflammatory cough. Plan: 5 additional days of antibiotics, consistent use of Allegra and other allergy medicines Robitussin-DM and hydrocodone as needed. I asked the patient to notify me next week on her progress, if she's not better she may need a referral.

## 2011-06-30 NOTE — Progress Notes (Signed)
  Subjective:    Patient ID: Angel Green, female    DOB: [birth year], 71 y.o.   MRN: [id number]  HPI Acute visit She was seen 06/16/2011 with cough, she was prescribed doxycycline and hydrocodone for cough suppression. She did very well while taking those medicines but then when she ran out the cough gradually came back. She is not coughing as intensely or as frequent as before but she still coughing every day.  Past Medical History:  HYPERTENSION  DIABETES MELLITUS, TYPE II  HYPERLIPIDEMIA  CRI (chronic renal insufficiency)  OSTEOARTHROSIS, GENERALIZED, MULTIPLE SITES  ANKLE PAIN, CHRONIC  RHINITIS, ALLERGIC NOS  Past Surgical History:  Cholecystectomy  Hysterectomy, partial in her 43s   Review of Systems No fever chills No wheezing, some sputum production from clear to light green. She takes Allegra and sometimes, Delma Freeze has helped the days she takes it. Denies any itchy eyes or itchy nose. Still has mild sinus congestion. Good medication compliance with BP meds, ambulatory BPs essentially normal, ~140 / 70s.     Objective:   Physical Exam  General -- alert, well-developed, and overweight appearing. No apparent distress.  Lungs -- normal respiratory effort, no intercostal retractions, no accessory muscle use, and normal breath sounds.   Heart-- normal rate, regular rhythm, no murmur, and no gallop.   Extremities-- no pretibial edema bilaterally  Neurologic-- alert & oriented X3 and strength normal in all extremities. Psych-- Cognition and judgment appear intact. Alert and cooperative with normal attention span and concentration.  not anxious appearing and not depressed appearing.      Assessment & Plan:

## 2011-07-01 ENCOUNTER — Encounter: Payer: Self-pay | Admitting: Internal Medicine

## 2011-07-20 ENCOUNTER — Other Ambulatory Visit: Payer: Self-pay | Admitting: *Deleted

## 2011-07-20 MED ORDER — INSULIN LISPRO 100 UNIT/ML ~~LOC~~ SOLN: 8.0000 [IU] | Freq: Two times a day (BID) | SUBCUTANEOUS | Status: DC

## 2011-07-20 NOTE — Telephone Encounter (Signed)
Rx sent 

## 2011-07-27 ENCOUNTER — Encounter: Payer: Self-pay | Admitting: Internal Medicine

## 2011-07-27 ENCOUNTER — Ambulatory Visit (INDEPENDENT_AMBULATORY_CARE_PROVIDER_SITE_OTHER): Payer: Medicare Other | Admitting: Internal Medicine

## 2011-07-27 VITALS — BP 140/92 | HR 80 | Temp 97.9°F | Wt 238.0 lb

## 2011-07-27 DIAGNOSIS — R059 Cough, unspecified: Secondary | ICD-10-CM

## 2011-07-27 DIAGNOSIS — R82998 Other abnormal findings in urine: Secondary | ICD-10-CM

## 2011-07-27 DIAGNOSIS — E1142 Type 2 diabetes mellitus with diabetic polyneuropathy: Secondary | ICD-10-CM | POA: Diagnosis not present

## 2011-07-27 DIAGNOSIS — R05 Cough: Secondary | ICD-10-CM

## 2011-07-27 DIAGNOSIS — E1149 Type 2 diabetes mellitus with other diabetic neurological complication: Secondary | ICD-10-CM

## 2011-07-27 DIAGNOSIS — E1140 Type 2 diabetes mellitus with diabetic neuropathy, unspecified: Secondary | ICD-10-CM

## 2011-07-27 NOTE — Assessment & Plan Note (Addendum)
Presents today with a superficial open sore at the left bunion, likely related to walking with high heels. On pinprick examination she does have some patchy decrease sensitivity. Recommend feet care, information provided, see instructions. She already has shoes for  Diabetics.

## 2011-07-27 NOTE — Progress Notes (Signed)
  Subjective:    Patient ID: Angel Green, female    DOB: 1940/09/09, 71 y.o.   MRN: [id number]  HPI Acute visit Has been doing a lot of walking in high heels for the last 2 weeks, 2 days ago noted a open sore in the left bunion. Has been taking care of it with a diabetic cream and Band-Aids. Overall it feels better now.  Past Medical History:  HYPERTENSION  DIABETES MELLITUS, TYPE II  HYPERLIPIDEMIA  CRI (chronic renal insufficiency)  OSTEOARTHROSIS, GENERALIZED, MULTIPLE SITES  ANKLE PAIN, CHRONIC  RHINITIS, ALLERGIC NOS   Past Surgical History:  Cholecystectomy  Hysterectomy, partial in her 69s   Review of Systems Denies any purulent discharge from the open sore but she did notice some losing of clear liquid. Occasionally has lower extremity edema. Denies any tingling or burning on her feet. A fossa cough, it has definitely improved, she only noticed occasional fleeting in her throat    Objective:   Physical Exam General -- alert, well-developed, and overweight appearing. No apparent distress.  DIABETIC FEET EXAM: No lower extremity edema Normal pedal pulses bilaterally Skin: Superficial open sore at the left bunion, no  discharge or cellulitis type changes. Has a callus at the base of the third left toe. No evidence of fungal infections Pinprick examination of the feet -- slightly decreased in a patchy fashion. Psych-- Cognition and judgment appear intact. Alert and cooperative with normal attention span and concentration.  not anxious appearing and not depressed appearing.       Assessment & Plan:

## 2011-07-27 NOTE — Patient Instructions (Addendum)
Use your diabetic shoes everyday Antibiotic ointment to the left bunion Call if not better, if the bunion bedome red, swollen or you see purulent discharge.

## 2011-07-27 NOTE — Assessment & Plan Note (Signed)
Improved, currently not coughing, just has some phlegm in her throat from time to time. Allergy medication seemed to help. Plan: Observation

## 2011-07-29 NOTE — ED Provider Notes (Signed)
Order(s) created erroneously. Erroneous order ID: [id number] Order moved by: Delanna Notice Order move date/time: 07/29/2011  7:22 AM Source Patient:     [id number] Source Contact: 07/27/2011 Destination Patient:   [id number] Destination Contact: 05/31/2011

## 2011-08-04 ENCOUNTER — Ambulatory Visit: Payer: Medicare Other | Admitting: Internal Medicine

## 2011-09-13 ENCOUNTER — Encounter: Payer: Self-pay | Admitting: Internal Medicine

## 2011-09-13 ENCOUNTER — Ambulatory Visit (INDEPENDENT_AMBULATORY_CARE_PROVIDER_SITE_OTHER): Payer: Medicare Other | Admitting: Internal Medicine

## 2011-09-13 VITALS — BP 140/84 | HR 82 | Temp 98.0°F | Wt 241.0 lb

## 2011-09-13 DIAGNOSIS — E1149 Type 2 diabetes mellitus with other diabetic neurological complication: Secondary | ICD-10-CM

## 2011-09-13 DIAGNOSIS — R059 Cough, unspecified: Secondary | ICD-10-CM

## 2011-09-13 DIAGNOSIS — R05 Cough: Secondary | ICD-10-CM | POA: Diagnosis not present

## 2011-09-13 DIAGNOSIS — E1142 Type 2 diabetes mellitus with diabetic polyneuropathy: Secondary | ICD-10-CM

## 2011-09-13 DIAGNOSIS — I10 Essential (primary) hypertension: Secondary | ICD-10-CM

## 2011-09-13 DIAGNOSIS — E119 Type 2 diabetes mellitus without complications: Secondary | ICD-10-CM

## 2011-09-13 DIAGNOSIS — E1140 Type 2 diabetes mellitus with diabetic neuropathy, unspecified: Secondary | ICD-10-CM

## 2011-09-13 MED ORDER — AMOXICILLIN 500 MG PO CAPS: 1000.0000 mg | ORAL_CAPSULE | Freq: Two times a day (BID) | ORAL | Status: DC

## 2011-09-13 MED ORDER — HYDROCODONE-HOMATROPINE 5-1.5 MG/5ML PO SYRP: 5.0000 mL | ORAL_SOLUTION | Freq: Three times a day (TID) | ORAL | Status: AC | PRN

## 2011-09-13 MED ORDER — INSULIN GLARGINE 100 UNIT/ML ~~LOC~~ SOLN: 45.0000 [IU] | Freq: Every day | SUBCUTANEOUS | Status: DC

## 2011-09-13 NOTE — Progress Notes (Signed)
  Subjective:    Patient ID: Angel Green, female    DOB: 1940-08-03, 71 y.o.   MRN: [id number]  HPI Acute visit Was feeling very well up until she returned from a conference last week and she developed cough, sore throat, sputum production which is green in color. As far as her diabetes, good medication compliance, ambulatory blood sugars between 76 and 104. Hypertension, good medication compliance, reports normal ambulatory BPs   but doesn't recall the actual readings  Past Medical History:   HYPERTENSION   DIABETES MELLITUS, TYPE II   HYPERLIPIDEMIA   CRI (chronic renal insufficiency)   OSTEOARTHROSIS, GENERALIZED, MULTIPLE SITES   ANKLE PAIN, CHRONIC   RHINITIS, ALLERGIC NOS    Past Surgical History:   Cholecystectomy   Hysterectomy, partial in her 67s   Review of Systems No fever or chills No nausea, vomiting, diarrhea. No chest pain, some wheezing.     Objective:   Physical Exam  General -- alert, well-developed, and overweight appearing. No apparent distress.  HEENT -- TMs normal, throat w/o redness, face symmetric and not tender to palpation, nose slt  congested   Lungs -- normal respiratory effort, no intercostal retractions, no accessory muscle use, and normal breath sounds.   Heart-- normal rate, regular rhythm, no murmur, and no gallop.  .   Extremities-- no pretibial edema bilaterally  Neurologic-- alert & oriented X3 and strength normal in all extremities. Psych-- Cognition and judgment appear intact. Alert and cooperative with normal attention span and concentration.  not anxious appearing and not depressed appearing.       Assessment & Plan:

## 2011-09-13 NOTE — Assessment & Plan Note (Signed)
Compliance, due for a hemoglobin A1c

## 2011-09-13 NOTE — Patient Instructions (Signed)
Rest, fluids , tylenol For cough, take Mucinex DM twice a day as needed  If the cough continue, take the hydrocodone syrup. Will cause drowsiness. Take the antibiotic as prescribed  (Amoxicillin) Call if no better in few days Call anytime if the symptoms are severe, you have high fever, short of breath, chest pain

## 2011-09-13 NOTE — Assessment & Plan Note (Addendum)
BP today 140/84, reports normal ambulatory BPs. No change, check a CBC and a BMP

## 2011-09-13 NOTE — Assessment & Plan Note (Addendum)
Presents with acute respiratory symptoms, she has been doing very well up until recently. She has mild sinusitis and bronchitis. See instructions

## 2011-09-14 ENCOUNTER — Other Ambulatory Visit: Payer: Medicare Other

## 2011-09-14 LAB — CBC WITH DIFFERENTIAL/PLATELET
Basophils Relative: 0.7 % (ref 0.0–3.0)
Eosinophils Absolute: 0.2 10*3/uL (ref 0.0–0.7)
MCHC: 33.2 g/dL (ref 30.0–36.0)
MCV: 95.3 fl (ref 78.0–100.0)
Monocytes Absolute: 0.5 10*3/uL (ref 0.1–1.0)
Neutrophils Relative %: 58.6 % (ref 43.0–77.0)
Platelets: 245 10*3/uL (ref 150.0–400.0)
RBC: 4.3 Mil/uL (ref 3.87–5.11)
RDW: 14.5 % (ref 11.5–14.6)

## 2011-09-17 LAB — BASIC METABOLIC PANEL
BUN: 15 mg/dL (ref 6–23)
Creatinine, Ser: 1.6 mg/dL — ABNORMAL HIGH (ref 0.4–1.2)
GFR: 41.78 mL/min — ABNORMAL LOW (ref >60.00)
Potassium: 4.1 mEq/L (ref 3.5–5.1)

## 2011-09-17 LAB — HEMOGLOBIN A1C: Hgb A1c MFr Bld: 7.9 % — ABNORMAL HIGH (ref 4.6–6.5)

## 2011-09-24 ENCOUNTER — Telehealth: Payer: Self-pay | Admitting: Internal Medicine

## 2011-09-24 ENCOUNTER — Emergency Department (HOSPITAL_COMMUNITY)
Admission: EM | Admit: 2011-09-24 | Discharge: 2011-09-24 | Disposition: A | Discharge status: Home / Self Care | Payer: Medicare Other | Attending: Emergency Medicine | Admitting: Emergency Medicine

## 2011-09-24 ENCOUNTER — Encounter (HOSPITAL_COMMUNITY): Payer: Self-pay | Admitting: Emergency Medicine

## 2011-09-24 DIAGNOSIS — E785 Hyperlipidemia, unspecified: Secondary | ICD-10-CM | POA: Insufficient documentation

## 2011-09-24 DIAGNOSIS — M1990 Unspecified osteoarthritis, unspecified site: Secondary | ICD-10-CM | POA: Diagnosis not present

## 2011-09-24 DIAGNOSIS — E119 Type 2 diabetes mellitus without complications: Secondary | ICD-10-CM | POA: Insufficient documentation

## 2011-09-24 DIAGNOSIS — Z79899 Other long term (current) drug therapy: Secondary | ICD-10-CM | POA: Insufficient documentation

## 2011-09-24 DIAGNOSIS — Z794 Long term (current) use of insulin: Secondary | ICD-10-CM | POA: Insufficient documentation

## 2011-09-24 DIAGNOSIS — I10 Essential (primary) hypertension: Secondary | ICD-10-CM | POA: Diagnosis not present

## 2011-09-24 DIAGNOSIS — R220 Localized swelling, mass and lump, head: Secondary | ICD-10-CM | POA: Insufficient documentation

## 2011-09-24 DIAGNOSIS — R221 Localized swelling, mass and lump, neck: Secondary | ICD-10-CM | POA: Diagnosis not present

## 2011-09-24 DIAGNOSIS — R229 Localized swelling, mass and lump, unspecified: Secondary | ICD-10-CM | POA: Diagnosis not present

## 2011-09-24 MED ORDER — DIPHENHYDRAMINE HCL 25 MG PO CAPS: 25.0000 mg | ORAL_CAPSULE | Freq: Four times a day (QID) | ORAL | Status: DC | PRN

## 2011-09-24 MED ORDER — FAMOTIDINE 20 MG PO TABS
40.0000 mg | ORAL_TABLET | Freq: Once | ORAL | Status: AC
  Administered 2011-09-24: 40 mg via ORAL
  Filled 2011-09-24 (×2): qty 1

## 2011-09-24 MED ORDER — PREDNISONE 20 MG PO TABS
40.0000 mg | ORAL_TABLET | Freq: Once | ORAL | Status: AC
  Administered 2011-09-24: 40 mg via ORAL
  Filled 2011-09-24: qty 2
  Filled 2011-09-24: qty 1

## 2011-09-24 MED ORDER — FAMOTIDINE IN NACL 20-0.9 MG/50ML-% IV SOLN: 20.0000 mg | Freq: Once | INTRAVENOUS | Status: DC

## 2011-09-24 MED ORDER — FAMOTIDINE 20 MG PO TABS: 20.0000 mg | ORAL_TABLET | Freq: Two times a day (BID) | ORAL | Status: DC

## 2011-09-24 MED ORDER — DIPHENHYDRAMINE HCL 50 MG/ML IJ SOLN: 25.0000 mg | Freq: Once | INTRAMUSCULAR | Status: DC

## 2011-09-24 MED ORDER — PREDNISONE 20 MG PO TABS: 40.0000 mg | ORAL_TABLET | Freq: Every day | ORAL | Status: DC

## 2011-09-24 MED ORDER — DIPHENHYDRAMINE HCL 25 MG PO CAPS
50.0000 mg | ORAL_CAPSULE | Freq: Once | ORAL | Status: AC
  Administered 2011-09-24: 50 mg via ORAL
  Filled 2011-09-24 (×2): qty 1

## 2011-09-24 NOTE — ED Notes (Signed)
The pt  Has been given medication for her swollen tongue.  She says the doctor wants to watch her for awhile for 2-3 hours.

## 2011-09-24 NOTE — ED Provider Notes (Signed)
History     CSN: [id number]  Arrival date & time 09/24/11  1308   First MD Initiated Contact with Patient 09/24/11 1355      Chief Complaint  Patient presents with  . Facial Swelling    (Consider location/radiation/quality/duration/timing/severity/associated sxs/prior treatment) The history is provided by the patient.  Angel Green is a 71 y.o. female presenting with c/o L facial swelling x 2 days. She noted possible swelling of her tongue this AM which concerned her and prompted her to present to the ED. She has no personal or family hx of the same. She is not currently taking any ACEIs or ARBs. No known dental problems or pain. She states that she feels as if that side of her face is "tight" and swelling. No known environmental/food exposures, other than recent course of amoxicillin for "a bad cold." She did recently start taking Mucinex 3 days ago as well which is new for her. She denies difficulty breathing, swallowing. States she is not having difficulty with speech at the current time but did feel as if it was difficult to talk earlier today.  Past Medical History  Diagnosis Date  . Hypertension   . Diabetes mellitus, type 2   . Hyperlipidemia   . Osteoarthritis   . Ankle pain     chronic  . Rhinitis     allergic nos  . RENAL INSUFFICIENCY, CHRONIC 04/16/2010    Past Surgical History  Procedure Date  . Cholecystectomy   . Partial hysterectomy     in her 85s    Family History  Problem Relation Age of Onset  . Heart attack Neg Hx   . Colon cancer Neg Hx   . Breast cancer Neg Hx     History  Substance Use Topics  . Smoking status: Never Smoker   . Smokeless tobacco: Not on file  . Alcohol Use: No    OB History    Grav Para Term Preterm Abortions TAB SAB Ect Mult Living                  Review of Systems  Constitutional: Negative for fever and chills.  HENT: Positive for congestion and sore throat. Negative for drooling, trouble swallowing, neck pain,  neck stiffness, dental problem, voice change and sinus pressure.   Eyes: Negative for pain, discharge, redness and itching.  Respiratory: Negative for cough, choking, chest tightness and shortness of breath.   Cardiovascular: Negative for chest pain and palpitations.  Gastrointestinal: Negative for nausea, vomiting and abdominal pain.  Musculoskeletal: Negative for myalgias.  Skin: Negative for rash.  Neurological: Negative for dizziness and weakness.    Allergies  Review of patient's allergies indicates no known allergies.  Home Medications   Current Outpatient Rx  Name Route Sig Dispense Refill  . ACETAMINOPHEN 325 MG PO TABS Oral Take 650 mg by mouth every 6 (six) hours as needed. For headache    . ASPIRIN 81 MG PO TABS Oral Take 81 mg by mouth daily.      . ATORVASTATIN CALCIUM 10 MG PO TABS Oral Take 1 tablet (10 mg total) by mouth daily. 90 tablet 0  . AZELASTINE HCL 137 MCG/SPRAY NA SOLN Nasal 2 sprays by Nasal route 2 (two) times daily. Use in each nostril as directed     . BECLOMETHASONE DIPROPIONATE 40 MCG/ACT IN AERS Inhalation Inhale 2 puffs into the lungs 2 (two) times daily. Rinse mouth 3 Inhaler 3  . VITAMIN D3 1000 UNITS PO CAPS Oral  Take 1 capsule by mouth daily.     . B-12 PO Oral Take 1 tablet by mouth daily.     Marland Kitchen FEXOFENADINE HCL 180 MG PO TABS Oral Take 180 mg by mouth daily as needed. For allergies    . FLUTICASONE PROPIONATE 50 MCG/ACT NA SUSP Nasal 2 sprays by Nasal route daily.      . FUROSEMIDE 20 MG PO TABS Oral Take 40 mg by mouth daily.    . INSULIN GLARGINE 100 UNIT/ML [place] SOLN Subcutaneous Inject 45 Units into the skin at bedtime. 10 mL 6  . INSULIN LISPRO (HUMAN) 100 UNIT/ML [place] SOLN Subcutaneous Inject 8 Units into the skin 2 (two) times daily with a meal. With 2 heaviest meals. 10 mL 1    3 month supply  . PEN NEEDLES 31G X 6 MM MISC  Use as directed 200 each 3    Patient Requires Extra Quantity Dispensed for [2]  ...  . CENTRUM SILVER PO Oral Take 1  tablet by mouth daily.      Marland Kitchen OMEPRAZOLE 20 MG PO CPDR Oral Take 1 capsule (20 mg total) by mouth daily. 90 capsule 1  . SITAGLIPTIN PHOSPHATE 100 MG PO TABS Oral Take 1 tablet (100 mg total) by mouth daily. 90 tablet 1  . HYDROCODONE-HOMATROPINE 5-1.5 MG/5ML PO SYRP Oral Take 5 mLs by mouth every 8 (eight) hours as needed for cough. 120 mL 0    BP 150/75  Pulse 83  Temp 97.6 F (36.4 C) (Oral)  Resp 18  SpO2 100%  Physical Exam  Nursing note and vitals reviewed. Constitutional: She appears well-developed and well-nourished. No distress.  HENT:  Head: Normocephalic and atraumatic.  Mouth/Throat: Oropharynx is clear and moist. No oropharyngeal exudate.       No evidence of facial edema, no angioedema noted to tongue or lips. Post oropharynx clear. No stridor noted. Pt speaks in paragraphs. Handling secretions without difficulty.  Eyes:       Normal appearance  Neck: Normal range of motion. Neck supple.  Cardiovascular: Normal rate, regular rhythm and normal heart sounds.   Pulmonary/Chest: Effort normal and breath sounds normal. She has no wheezes. She exhibits no tenderness.  Abdominal: Soft. Bowel sounds are normal. There is no tenderness. There is no rebound and no guarding.  Musculoskeletal: Normal range of motion.  Lymphadenopathy:    She has no cervical adenopathy.  Neurological: She is alert.  Skin: Skin is warm and dry. She is not diaphoretic.  Psychiatric: She has a normal mood and affect.    ED Course  Procedures (including critical care time)  Labs Reviewed - No data to display No results found.   No diagnosis found.     MDM  Pt presents with reported facial swelling and developed tongue swelling today. On exam, no apparent evidence of angioedema or airway compromise. Stable VS. Feel pt stable for discharge at this time. Dose of benadryl/pred/pepcid given here, will rx benadryl/pepcid to go home. She was instructed on s/sx that would prompt a return visit. She  verbalized understanding and agreed to plan.       Abran Richard, PA-C 09/24/11 1537   Agree with above note. Will give prednisone as well when we d/c her.   Wandra Arthurs, MD 09/24/11 [id number]

## 2011-09-24 NOTE — ED Notes (Signed)
Pt c/o facial swelling left side x3 days. Brought in via EMS. No numbness, tingling, pain, no deficits with speech, swallowing or breathing.

## 2011-09-24 NOTE — ED Notes (Signed)
The pt says her tongue feels better.  Less swollen

## 2011-09-24 NOTE — ED Notes (Signed)
Pt saw pcp earlier in week for cold like symptoms. Has been taking mucinex dm and has noticed the left side of her tongue, face feels swollen. No obvious swelling visualized by this RN.

## 2011-09-24 NOTE — Telephone Encounter (Signed)
Caller: Angel Green/Patient; PCP: Kathlene November; CB#: [phone];  Call regarding Tongue Swollen On Left Side; Onset 09/22/11.  Afebrile. FBS 130. Tongue "feels so thick" it makes it hard to talk. Sipping water. L side of face and throat  feels "different."   Feels like something in throat.  Completed Amoxil 09/19/11. No shortness of breath. Advised to call 911 now for s/s of anaphylaxis per Severe Allergic Reaction guideline.

## 2011-09-24 NOTE — Telephone Encounter (Signed)
Agree with ER evaluation

## 2011-09-28 ENCOUNTER — Other Ambulatory Visit: Payer: Self-pay | Admitting: Internal Medicine

## 2011-09-28 DIAGNOSIS — Z1231 Encounter for screening mammogram for malignant neoplasm of breast: Secondary | ICD-10-CM

## 2011-10-14 ENCOUNTER — Encounter: Payer: Self-pay | Admitting: Internal Medicine

## 2011-10-14 ENCOUNTER — Ambulatory Visit (INDEPENDENT_AMBULATORY_CARE_PROVIDER_SITE_OTHER): Payer: Medicare Other | Admitting: Internal Medicine

## 2011-10-14 VITALS — BP 142/88 | HR 89 | Temp 97.9°F | Wt 246.0 lb

## 2011-10-14 DIAGNOSIS — E1149 Type 2 diabetes mellitus with other diabetic neurological complication: Secondary | ICD-10-CM | POA: Diagnosis not present

## 2011-10-14 DIAGNOSIS — E1142 Type 2 diabetes mellitus with diabetic polyneuropathy: Secondary | ICD-10-CM

## 2011-10-14 DIAGNOSIS — R609 Edema, unspecified: Secondary | ICD-10-CM

## 2011-10-14 DIAGNOSIS — E1140 Type 2 diabetes mellitus with diabetic neuropathy, unspecified: Secondary | ICD-10-CM

## 2011-10-14 NOTE — Patient Instructions (Addendum)
We will refer you to endocrinology for the management of diabetes and to see Dr. Annamaria Boots  for the swelling. Please come back in October for your physical Please go to the ER if you have severe tongue swelling, facial swelling or difficulty breathing

## 2011-10-14 NOTE — Assessment & Plan Note (Signed)
Last A1c more than 7, CBGs ranging from 300-120. Plan: Refer to endocrinology

## 2011-10-14 NOTE — Progress Notes (Signed)
  Subjective:    Patient ID: Angel Green, female    DOB: [birth year], 71 y.o.   MRN: [id number]  HPI Here for a followup: Went to the ER 09/24/2011 with face and tongue swelling, of the ER, the physician there  did not notice any objective evidence of swelling. She was sent home with Benadryl, Pepcid and prednisone for 4 days. Since then, she reports that she still has "on and off swelling in my face, arms, legs and sometimes the tongue".  As far as her diabetes, her CBGs are up and down ranging from the 300 to 120s either in the morning, afternoon or night.  Past Medical History:   HYPERTENSION   DIABETES MELLITUS, TYPE II   HYPERLIPIDEMIA   CRI (chronic renal insufficiency)   OSTEOARTHROSIS, GENERALIZED, MULTIPLE SITES   ANKLE PAIN, CHRONIC   RHINITIS, ALLERGIC NOS    Past Surgical History:   Cholecystectomy   Hysterectomy, partial in her 70s   Review of Systems See HPI    Objective:   Physical Exam General -- alert, well-developed, and overweight appearing. No apparent distress.  HEENT --  , face normal to inspection. No evidence of actual swelling. Extremities-- trace pretibial edema , arms and hands without evidence of edema. Neurologic-- alert & oriented X3 and strength normal in all extremities. Psych-- Cognition and judgment appear intact. Alert and cooperative with normal attention span and concentration.  not anxious appearing and not depressed appearing.       Assessment & Plan:

## 2011-10-14 NOTE — Assessment & Plan Note (Signed)
Complains of edema of the face, tongue, arms and legs. On exam, there is no objective evidence of edema except for trace pretibial swelling. By the way the patient describes the tongue swelling, this could be angioedema noting that she's not taking any new medication, specifically not taking ACE inhibitors. Plan: Refer back to her allergist, angioedema?

## 2011-10-15 ENCOUNTER — Ambulatory Visit (INDEPENDENT_AMBULATORY_CARE_PROVIDER_SITE_OTHER): Payer: Medicare Other | Admitting: Internal Medicine

## 2011-10-15 ENCOUNTER — Encounter: Payer: Self-pay | Admitting: Internal Medicine

## 2011-10-15 ENCOUNTER — Other Ambulatory Visit: Payer: Medicare Other

## 2011-10-15 VITALS — BP 140/98 | HR 98 | Ht 64.75 in | Wt 245.6 lb

## 2011-10-15 DIAGNOSIS — T783XXA Angioneurotic edema, initial encounter: Secondary | ICD-10-CM

## 2011-10-15 NOTE — Progress Notes (Signed)
Patient ID: Angel Green, female    DOB: 1941/01/31, 71 y.o.   MRN: [id number]  HPI 08/12/10 -49 yoF never smoker, seen at kind request of Dr Larose Kells. Complains that for 3 years she has had excessive phlegm/ congestion in chest and throat. this is present most of the time. Nasal sprays reduce the drainage. Feels that this condition makes her weak and washed out. Symptoms worse in Spring and with weather changes. Seemed better while she was being treated for a cold. Denies dysphagia or reflux but has been told she has GERD. Nasal surgery-? Septoplasty?in past. Allergy skin tested twice with little positive response. Denies asthma, skin rash or relation to meals. Astelin and Flonase used intermittently.  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 Retired from [employer] work. CXR- neg, NAD 06/30/10  09/17/10- 08/12/10 -32 yoF never smoker,  Followed for allergic rhinitis, bronchitis. Complains that for 3 years she has had excessive phlegm/ congestion in chest and throat. Last visit we gave Qvar- big help; allegra- not much help by itself. 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 Lemonade drink mix makes phlegm come.   12/18/10--70 yoF never smoker,  Followed for allergic rhinitis, bronchitis. Complains that for 3 years she has had excessive phlegm/ congestion in chest and throat. Had flu vaccine. No acute problems since last here and feels well. Minor sniffling just in the last day but no cough, chest pain or significant sinus discomfort. She continues Qvar 40.  10/15/11- -70 yoF never smoker,  Followed for allergic rhinitis, bronchitis.  New problem-urticaria/angioedema. Had a viral URI syndrome then woke August 2 with facial and tongue swelling, hard to talk. Involvement especially in left cheek and left side of tongue. ER visit questioned allergy and treated with Benadryl 50 mg plus Pepcid 40 mg which continue, and therefore day course of prednisone. Her URI symptoms have been treated with Mucinex  and cough syrup. Allergy Profile 08/13/2010-total IgE 21.4, negative for specific elevations.  Review of Systems-HPI Constitutional:   No-   weight loss, night sweats, fevers, chills, fatigue, lassitude. HEENT:   No-  headaches, difficulty swallowing, tooth/dental problems, +sore throat,       No-  sneezing, itching, ear ache, +nasal congestion, post nasal drip,  CV:  No-   chest pain, orthopnea, PND, swelling in lower extremities, anasarca, dizziness, palpitations Resp: No-   shortness of breath with exertion or at rest.              +  productive cough,  No non-productive cough,  No- coughing up of blood.              No-   change in color of mucus.  No- wheezing.   Skin: No-   rash or lesions. GI:  No-   heartburn, indigestion, abdominal pain, nausea, vomiting,  GU:  MS:  No-   joint pain or swelling.   Neuro-     nothing unusual Psych:  No- change in mood or affect. No depression or anxiety.  No memory loss.   Objective:   Physical Exam BP 140/98  Pulse 98  Ht 5' 4.75" (1.645 m)  Wt 245 lb 9.6 oz (111.403 kg)  BMI 41.19 kg/m2  SpO2 100%  General- Alert, Oriented, Affect-appropriate, Distress- none acute    obese Skin- rash-none, lesions- none, excoriation- none Lymphadenopathy- none Head- atraumatic. No facial asymmetry. Left parotid area seems normal.            Eyes- Gross vision intact,  PERRLA, conjunctivae clear secretions            Ears- Hearing, canals- normal            Nose- Clear, No-Septal dev, mucus, polyps, erosion, perforation             Throat- Mallampati II , mucosa clear , drainage- none, tonsils- atrophic Neck- flexible , trachea midline, no stridor , thyroid nl, carotid no bruit Chest - symmetrical excursion , unlabored           Heart/CV- RRR , no murmur , no gallop  , no rub, nl s1 s2                           - JVD- none , edema- none, stasis changes- none, varices- none           Lung- clear to P&A, wheeze- none, cough- none , dullness-none, rub-  none           Chest wall-  Abd- tender-no, distended-no, bowel sounds-present, HSM- no Br/ Gen/ Rectal- Not done, not indicated Extrem- cyanosis- none, clubbing, none, atrophy- none, strength- nl Neuro- grossly intact to observation

## 2011-10-15 NOTE — Patient Instructions (Addendum)
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 You can just use allegra as your antihistamine.

## 2011-10-19 LAB — ALLERGEN FOOD PROFILE SPECIFIC IGE
Apple: <0.1 kU/L
Egg White IgE: <0.1 kU/L
Milk IgE: <0.1 kU/L
Orange: <0.1 kU/L
Peanut IgE: <0.1 kU/L
Shrimp IgE: <0.1 kU/L
Tomato IgE: <0.1 kU/L
Tuna IgE: <0.1 kU/L

## 2011-10-22 DIAGNOSIS — T783XXA Angioneurotic edema, initial encounter: Secondary | ICD-10-CM | POA: Insufficient documentation

## 2011-10-22 NOTE — Assessment & Plan Note (Signed)
She probably had angioedema of the face and,. Most likely this was part of a viral inflammatory/cold syndrome. Differential diagnosis would include parotid gland inflammation or parotid duct stone. Consider possible food allergy reaction. Plan-allergy profiles, IgE for alpha-gal. Stopped Mucinex D. and Benadryl. Use Allegra.

## 2011-10-27 ENCOUNTER — Ambulatory Visit: Payer: Medicare Other | Admitting: Internal Medicine

## 2011-11-04 ENCOUNTER — Encounter: Payer: Self-pay | Admitting: Endocrinology

## 2011-11-04 ENCOUNTER — Ambulatory Visit (INDEPENDENT_AMBULATORY_CARE_PROVIDER_SITE_OTHER): Payer: Medicare Other | Admitting: Endocrinology

## 2011-11-04 VITALS — BP 122/88 | HR 88 | Temp 97.6°F | Ht 64.75 in | Wt 242.0 lb

## 2011-11-04 DIAGNOSIS — E1149 Type 2 diabetes mellitus with other diabetic neurological complication: Secondary | ICD-10-CM | POA: Diagnosis not present

## 2011-11-04 DIAGNOSIS — E1142 Type 2 diabetes mellitus with diabetic polyneuropathy: Secondary | ICD-10-CM

## 2011-11-04 DIAGNOSIS — E1140 Type 2 diabetes mellitus with diabetic neuropathy, unspecified: Secondary | ICD-10-CM

## 2011-11-04 NOTE — Patient Instructions (Addendum)
good diet and exercise habits significanly improve the control of your diabetes.  please let me know if you wish to be referred to a dietician.  high blood sugar is very risky to your health.  you should see an eye doctor every year.  You are at higher than average risk for pneumonia and hepatitis-B.  You should be vaccinated against both.   controlling your blood pressure and cholesterol drastically reduces the damage diabetes does to your body.  this also applies to quitting smoking.  please discuss these with your doctor.  you should take an aspirin every day, unless you have been advised by a doctor not to. check your blood sugar 2 times a day.  vary the time of day when you check, between before the 3 meals, and at bedtime.  also check if you have symptoms of your blood sugar being too high or too low.  please keep a record of the readings and bring it to your next appointment here.  please call us sooner if your blood sugar goes below 70, or if you have a lot of readings over 200. Stop januvia Decrease lantus to 40 units daily Increase humalog to 3 times a day (just before each meal) 04-30-10 units. Please come back for a follow-up appointment for 1 month.

## 2011-11-04 NOTE — Progress Notes (Signed)
Subjective:    Patient ID: Angel Green, female    DOB: 1940-10-30, 71 y.o.   MRN: [id number]  HPI pt states 14 years h/o dm.  it is complicated by renal insufficiency.  she has been on insulin x 3 years.  pt says her diet is good and exercise is limited by frequent travel (with husband, who pastors many churches).  She reports 8 months of moderately excessive diaphoresis throughout the body, but no assoc fever.  She reports cbg's vary from 119-300, but mostly in the 100's.  It is highest at hs, and lowest in am.   Past Medical History  Diagnosis Date  . Hypertension   . Diabetes mellitus, type 2   . Hyperlipidemia   . Osteoarthritis   . Ankle pain     chronic  . Rhinitis     allergic nos  . RENAL INSUFFICIENCY, CHRONIC 04/16/2010    Past Surgical History  Procedure Date  . Cholecystectomy   . Partial hysterectomy     in her 30s    History   Social History  . Marital Status: Married    Spouse Name: N/A    Number of Children: 2  . Years of Education: N/A   Occupational History  . retired-banking business    Social History Main Topics  . Smoking status: Never Smoker   . Smokeless tobacco: Not on file  . Alcohol Use: No  . Drug Use: No  . Sexually Active: Not on file   Other Topics Concern  . Not on file   Social History Narrative   Regular exercise- yes: walking     Current Outpatient Prescriptions on File Prior to Visit  Medication Sig Dispense Refill  . aspirin 81 MG tablet Take 81 mg by mouth daily.        Marland Kitchen atorvastatin (LIPITOR) 10 MG tablet Take 1 tablet (10 mg total) by mouth daily.  90 tablet  0  . azelastine (ASTELIN) 137 MCG/SPRAY nasal spray 2 sprays by Nasal route 2 (two) times daily. Use in each nostril as directed       . beclomethasone (QVAR) 40 MCG/ACT inhaler Inhale 2 puffs into the lungs 2 (two) times daily. Rinse mouth  3 Inhaler  3  . Cholecalciferol (VITAMIN D3) 1000 UNITS CAPS Take 1 capsule by mouth daily.       . Cyanocobalamin (B-12  PO) Take 1 tablet by mouth daily.       Marland Kitchen dextromethorphan-guaiFENesin (MUCINEX DM) 30-600 MG per 12 hr tablet Take 1 tablet by mouth every 12 (twelve) hours.      . diphenhydrAMINE (BENADRYL) 25 mg capsule Take 1 capsule (25 mg total) by mouth every 6 (six) hours as needed for itching.  30 capsule  0  . famotidine (PEPCID) 20 MG tablet Take 1 tablet (20 mg total) by mouth 2 (two) times daily.  30 tablet  0  . fexofenadine (ALLEGRA) 180 MG tablet Take 180 mg by mouth daily as needed. For allergies      . fluticasone (FLONASE) 50 MCG/ACT nasal spray 2 sprays by Nasal route daily.        . furosemide (LASIX) 20 MG tablet Take 40 mg by mouth daily.      . insulin glargine (LANTUS) 100 UNIT/ML injection Inject 45 Units into the skin at bedtime.  10 mL  6  . insulin lispro (HUMALOG) 100 UNIT/ML injection Inject 8 Units into the skin 2 (two) times daily with a meal. With 2 heaviest meals.  10 mL  1  . Insulin Pen Needle (PEN NEEDLES) 31G X 6 MM MISC Use as directed  200 each  3  . Multiple Vitamins-Minerals (CENTRUM SILVER PO) Take 1 tablet by mouth daily.        Marland Kitchen omeprazole (PRILOSEC) 20 MG capsule Take 1 capsule (20 mg total) by mouth daily.  90 capsule  1  . sitaGLIPtin (JANUVIA) 100 MG tablet Take 1 tablet (100 mg total) by mouth daily.  90 tablet  1    No Known Allergies  Family History  Problem Relation Age of Onset  . Heart attack Neg Hx   . Colon cancer Neg Hx   . Breast cancer Neg Hx   DM: father  BP 122/88  Pulse 88  Temp 97.6 F (36.4 C) (Oral)  Wt 242 lb (109.77 kg)  SpO2 96%  Review of Systems denies weight loss, blurry vision, headache, chest pain, sob, n/v, urinary frequency, cramps, skin rash, memory loss, depression, and menopausal sxs.  she has rhinorrhea and easy bruising     Objective:   Physical Exam VS: see vs page GEN: no distress HEAD: head: no deformity eyes: no periorbital swelling, no proptosis external nose and ears are normal mouth: no lesion  seen NECK: supple, thyroid is not enlarged CHEST WALL: no deformity LUNGS:  Clear to auscultation CV: reg rate and rhythm, no murmur ABD: abdomen is soft, nontender.  no hepatosplenomegaly.  not distended.  no hernia.  Old healed surgical scar MUSCULOSKELETAL: muscle bulk and strength are grossly normal.  no obvious joint swelling.  gait is normal and steady EXTEMITIES: no deformity.  no ulcer on the feet.  feet are of normal color and temp.  no edema PULSES: dorsalis pedis intact bilat.  no carotid bruit NEURO:  cn 2-12 grossly intact.   readily moves all 4's.  sensation is intact to touch on the feet, but decreased from normal SKIN:  Normal texture and temperature.  No rash or suspicious lesion is visible.   NODES:  None palpable at the neck PSYCH: alert, oriented x3.  Does not appear anxious nor depressed.  Lab Results  Component Value Date   HGBA1C 7.9* 09/14/2011      Assessment & Plan:  DM, Based on the pattern of her cbg's, she needs some adjustment in her therapy Diaphoresis, very unlikely thyroid-related Neuropathy, due to DM

## 2011-11-08 ENCOUNTER — Ambulatory Visit (HOSPITAL_COMMUNITY)
Admission: RE | Admit: 2011-11-08 | Discharge: 2011-11-08 | Disposition: A | Discharge status: Home / Self Care | Payer: Medicare Other | Source: Ambulatory Visit | Attending: Internal Medicine | Admitting: Internal Medicine

## 2011-11-08 DIAGNOSIS — Z1231 Encounter for screening mammogram for malignant neoplasm of breast: Secondary | ICD-10-CM | POA: Diagnosis not present

## 2011-11-15 ENCOUNTER — Telehealth: Payer: Self-pay

## 2011-11-15 ENCOUNTER — Ambulatory Visit: Payer: Medicare Other | Admitting: Internal Medicine

## 2011-11-15 NOTE — Telephone Encounter (Signed)
Check on the patient, from what I read she forgot her insulin, she needs to go back on it. If for whatever  reason CBGs are  Persistently  more than 250 ---> needs to be seen at the local  urgent care

## 2011-11-15 NOTE — Telephone Encounter (Signed)
LMOVM for pt to return call 

## 2011-11-15 NOTE — Telephone Encounter (Signed)
[facility] Triage Call Report Triage Record Num: [id number] Operator: Willette Cluster Patient Name: Angel Green Call Date & Time: 11/13/2011 5:12:13PM Patient Phone: [phone] PCP: Alda Berthold. [name] Patient Gender: Female PCP Fax : Patient DOB: April 16, 1940 Practice Name: [facility] Reason for Call: Caller: [name]; PCP: Kathlene November; CB#: [phone]; Call regarding Medication Issue; Medication(s): left insulin at home; she is in [place] tx ; 11-13-11 she is in [place] for church meeting and will be gone 9 days and forgot to bring her insulin. Verified with her and Epic her dosages and kinds of insulin she needs. Per standing orders Lantus 100u/ml 40 u subq daily one pen with no refills and Humalog 100u/ml TID per her sliding scale one pen with no refills and box of pen needles 31 G x 63mm [facility] [id number] Protocol(s) Used: Office Note Recommended Outcome per Protocol: Information Noted and Sent to Office Reason for Outcome: Caller information to office Care Advice: ~ 2 MESSAGES LEFT AFTER HOURS FOR C-A-N

## 2011-11-15 NOTE — Telephone Encounter (Signed)
[facility] Triage Call Report Triage Record Num: [id number] Operator: Thereasa Parkin Patient Name: Angel Green Call Date & Time: 11/14/2011 11:02:41AM Patient Phone: [phone] PCP: Alda Berthold. [name] Patient Gender: Female PCP Fax : Patient DOB: 01-Aug-1940 Practice Name: [facility] Reason for Call: Caller: Verella/Patient; PCP: Kathlene November; CB#: [phone]; Call regarding Medication Issue suger is 367; Left home without 2 insulins; Humalog used TID and Lantus used daily. In [place] until 11/21/11. Confirmed [facility] for both [facility]ulins [facility] [place] per triage 11/13/11 at 1712. States she cannot afford to fill them. Usually gets medications [facility] and recently had them filled. [facility] was unable to reach [facility] 11/13/11 for override due to lines were down or office closed; If had to pay over the counter, it would be > $800.00. Just needs one pen of each insulin. Advised to see ED now for signs and symptoms fof ketoacidosis and blood sugar more than 300 mg/dl per [facility]. Per caller request, [phone] to ask if could purchase just one pen of each insulin and cost. Per [facility], RX was [facility] to another [facility] [phone] since [facility]st store was closing. [facility] is closed on weekends. Insulin packs cannot be broken up to dispense one pen of each insulin. Caller will have to pay for insulin then contact [employer] 11/15/11 to see if they will allow a "lost medication" override" and reimburse the cost of the medication. Caller provided with [employer] customer service number for [employer] [phone]. Again advised to go to UC now for immediate insulin treatment then deal with insurance and cost issues for the sliding scale Humalog used TID. Verbalized understanding. Protocol(s) Used: Diabetes: Control Problems Recommended Outcome per Protocol: See ED Immediately Reason for Outcome: Signs and symptoms of ketoacidosis AND  blood sugar more than 300 mg/dl Care Advice: ~ Another adult should drive. Dehydration can affect blood sugar levels. Drink water during transport and while waiting to see a provider. If vomiting, take sips of water or suck on ice chips. ~ ~ IMMEDIATE ACTION Write down provider's name. List or place the following in a bag for transport with the patient: current prescription and/or nonprescription medications; alternative treatments, therapies and medications; and street drugs. ~

## 2011-11-19 NOTE — Telephone Encounter (Signed)
LMOVM for pt to return call 

## 2011-11-22 NOTE — Telephone Encounter (Signed)
Spoke with pt & she states she went 4 days without her insulin but now she is back home & taking her insulin regularly & is feeling much better. Pt states that she checked her blood sugars this morning & it was 110.

## 2011-11-22 NOTE — Telephone Encounter (Signed)
thx

## 2011-11-30 ENCOUNTER — Ambulatory Visit (INDEPENDENT_AMBULATORY_CARE_PROVIDER_SITE_OTHER): Payer: Medicare Other | Admitting: Internal Medicine

## 2011-11-30 ENCOUNTER — Encounter: Payer: Self-pay | Admitting: Internal Medicine

## 2011-11-30 VITALS — BP 146/96 | HR 90 | Ht 64.75 in | Wt 243.0 lb

## 2011-11-30 DIAGNOSIS — J309 Allergic rhinitis, unspecified: Secondary | ICD-10-CM | POA: Diagnosis not present

## 2011-11-30 DIAGNOSIS — R05 Cough: Secondary | ICD-10-CM | POA: Diagnosis not present

## 2011-11-30 DIAGNOSIS — R059 Cough, unspecified: Secondary | ICD-10-CM | POA: Diagnosis not present

## 2011-11-30 DIAGNOSIS — T783XXA Angioneurotic edema, initial encounter: Secondary | ICD-10-CM | POA: Diagnosis not present

## 2011-11-30 DIAGNOSIS — Z23 Encounter for immunization: Secondary | ICD-10-CM | POA: Diagnosis not present

## 2011-11-30 NOTE — Patient Instructions (Addendum)
Flu shot   Sample Dymista nasal spray   2 puffs each nostril every night at bedtime.  Try this for now instead of Astelin and Flonase

## 2011-11-30 NOTE — Progress Notes (Signed)
Patient ID: Angel Green, female    DOB: August 14, 1940, 71 y.o.   MRN: [id number]  HPI 08/12/10 -29 yoF never smoker, seen at kind request of Dr Larose Kells. Complains that for 3 years she has had excessive phlegm/ congestion in chest and throat. this is present most of the time. Nasal sprays reduce the drainage. Feels that this condition makes her weak and washed out. Symptoms worse in Spring and with weather changes. Seemed better while she was being treated for a cold. Denies dysphagia or reflux but has been told she has GERD. Nasal surgery-? Septoplasty?in past. Allergy skin tested twice with little positive response. Denies asthma, skin rash or relation to meals. Astelin and Flonase used intermittently.  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 Retired from [employer] work. CXR- neg, NAD 06/30/10  09/17/10- 08/12/10 -1 yoF never smoker,  Followed for allergic rhinitis, bronchitis. Complains that for 3 years she has had excessive phlegm/ congestion in chest and throat. Last visit we gave Qvar- big help; allegra- not much help by itself. 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 Lemonade drink mix makes phlegm come.   12/18/10--70 yoF never smoker,  Followed for allergic rhinitis, bronchitis. Complains that for 3 years she has had excessive phlegm/ congestion in chest and throat. Had flu vaccine. No acute problems since last here and feels well. Minor sniffling just in the last day but no cough, chest pain or significant sinus discomfort. She continues Qvar 40.  10/15/11- -70 yoF never smoker,  Followed for allergic rhinitis, bronchitis.  New problem-urticaria/angioedema. Had a viral URI syndrome then woke August 2 with facial and tongue swelling, hard to talk. Involvement especially in left cheek and left side of tongue. ER visit questioned allergy and treated with Benadryl 50 mg plus Pepcid 40 mg which continue, and therefore day course of prednisone. Her URI symptoms have been treated with Mucinex  and cough syrup. Allergy Profile 08/13/2010-total IgE 21.4, negative for specific elevations.  11/30/11- 21 yoF never smoker,  Followed for allergic rhinitis, bronchitis. No swelling in tongue or face since last visit Still postnasal drip which she treats with Astelin. Dry cough when she lies down.  Review of Systems-HPI Constitutional:   No-   weight loss, night sweats, fevers, chills, fatigue, lassitude. HEENT:   No-  headaches, difficulty swallowing, tooth/dental problems, sore throat,       No-  sneezing, itching, ear ache, +nasal congestion, post nasal drip,  CV:  No-   chest pain, orthopnea, PND, swelling in lower extremities, anasarca, dizziness, palpitations Resp: No-   shortness of breath with exertion or at rest.             No-  productive cough,  + non-productive cough,  No- coughing up of blood.              No-   change in color of mucus.  No- wheezing.   Skin: No-   rash or lesions. GI:  No-   heartburn, indigestion, abdominal pain, nausea, vomiting,  GU:  MS:  No-   joint pain or swelling.   Neuro-     nothing unusual Psych:  No- change in mood or affect. No depression or anxiety.  No memory loss.   Objective:   Physical Exam BP 146/96  Pulse 90  Ht 5' 4.75" (1.645 m)  Wt 243 lb (110.224 kg)  BMI 40.75 kg/m2  SpO2 98% General- Alert, Oriented, Affect-appropriate, Distress- none acute    obese Skin- rash-none,  lesions- none, excoriation- none Lymphadenopathy- none Head- atraumatic. No facial asymmetry. Left parotid area seems normal.            Eyes- Gross vision intact, PERRLA, conjunctivae clear secretions            Ears- Hearing, canals- normal            Nose- Clear, No-Septal dev, mucus, polyps, erosion, perforation             Throat- Mallampati II , +posterior pharynx is red and glandular , drainage- none, tonsils- atrophic Neck- flexible , trachea midline, no stridor , thyroid nl, carotid no bruit Chest - symmetrical excursion , unlabored            Heart/CV- RRR , no murmur , no gallop  , no rub, nl s1 s2                           - JVD- none , edema- none, stasis changes- none, varices- none           Lung- clear to P&A, wheeze- none, cough- none , dullness-none, rub- none           Chest wall-  Abd-  Br/ Gen/ Rectal- Not done, not indicated Extrem- cyanosis- none, clubbing, none, atrophy- none, strength- nl Neuro- grossly intact to observation

## 2011-12-02 DIAGNOSIS — Z23 Encounter for immunization: Secondary | ICD-10-CM | POA: Diagnosis not present

## 2011-12-06 ENCOUNTER — Other Ambulatory Visit: Payer: Self-pay | Admitting: *Deleted

## 2011-12-06 MED ORDER — OMEPRAZOLE 20 MG PO CPDR: 20.0000 mg | DELAYED_RELEASE_CAPSULE | Freq: Every day | ORAL | Status: DC

## 2011-12-06 MED ORDER — ATORVASTATIN CALCIUM 10 MG PO TABS: 10.0000 mg | ORAL_TABLET | Freq: Every day | ORAL | Status: DC

## 2011-12-06 MED ORDER — FUROSEMIDE 20 MG PO TABS: 40.0000 mg | ORAL_TABLET | Freq: Every day | ORAL | Status: DC

## 2011-12-06 NOTE — Assessment & Plan Note (Signed)
Active angioedema  is in remission or resolved. She will watch for relapse.

## 2011-12-06 NOTE — Assessment & Plan Note (Signed)
Dry cough when lying down raises concern of reflux which we have discussed carefully. Educated in reflux precautions.

## 2011-12-06 NOTE — Telephone Encounter (Signed)
Refill done.  

## 2011-12-06 NOTE — Assessment & Plan Note (Addendum)
Astelin does not quite control postnasal drainage Plan-Dymista nasal spray

## 2011-12-07 MED ORDER — FUROSEMIDE 20 MG PO TABS: 40.0000 mg | ORAL_TABLET | Freq: Every day | ORAL | Status: DC

## 2011-12-07 MED ORDER — OMEPRAZOLE 20 MG PO CPDR: 20.0000 mg | DELAYED_RELEASE_CAPSULE | Freq: Every day | ORAL | Status: DC

## 2011-12-07 MED ORDER — ATORVASTATIN CALCIUM 10 MG PO TABS: 10.0000 mg | ORAL_TABLET | Freq: Every day | ORAL | Status: DC

## 2011-12-07 NOTE — Telephone Encounter (Signed)
rx's sent to wrong pharmacy yesterday. Spoke to [facility] & cancelled the rx's. [facility]

## 2011-12-07 NOTE — Addendum Note (Signed)
Addended by: Douglass Rivers T on: 12/07/2011 11:12 AM   Modules accepted: Orders

## 2011-12-08 NOTE — Progress Notes (Signed)
Quick Note:  Pt aware of results. ______ 

## 2011-12-09 ENCOUNTER — Encounter: Payer: Self-pay | Admitting: Endocrinology

## 2011-12-09 ENCOUNTER — Ambulatory Visit (INDEPENDENT_AMBULATORY_CARE_PROVIDER_SITE_OTHER): Payer: Medicare Other | Admitting: Endocrinology

## 2011-12-09 VITALS — BP 132/84 | HR 100 | Temp 97.6°F | Wt 240.0 lb

## 2011-12-09 DIAGNOSIS — E1149 Type 2 diabetes mellitus with other diabetic neurological complication: Secondary | ICD-10-CM | POA: Diagnosis not present

## 2011-12-09 DIAGNOSIS — E1142 Type 2 diabetes mellitus with diabetic polyneuropathy: Secondary | ICD-10-CM | POA: Diagnosis not present

## 2011-12-09 DIAGNOSIS — E1140 Type 2 diabetes mellitus with diabetic neuropathy, unspecified: Secondary | ICD-10-CM

## 2011-12-09 NOTE — Progress Notes (Signed)
Subjective:    Patient ID: Angel Green, female    DOB: 03-27-1940, 71 y.o.   MRN: [id number]  HPI pt returns for f/u of insulin-requiring DM (dx'ed [id number]; complicated by renal insufficiency.  she has been on insulin x 3 years).  no cbg record, but states cbg's vary from 82-180.  It is in general higher as the day goes on. Past Medical History  Diagnosis Date  . Hypertension   . Diabetes mellitus, type 2   . Hyperlipidemia   . Osteoarthritis   . Ankle pain     chronic  . Rhinitis     allergic nos  . RENAL INSUFFICIENCY, CHRONIC 04/16/2010    Past Surgical History  Procedure Date  . Cholecystectomy   . Partial hysterectomy     in her 30s    History   Social History  . Marital Status: Married    Spouse Name: N/A    Number of Children: 2  . Years of Education: N/A   Occupational History  . retired-banking business    Social History Main Topics  . Smoking status: Never Smoker   . Smokeless tobacco: Not on file  . Alcohol Use: No  . Drug Use: No  . Sexually Active: Not on file   Other Topics Concern  . Not on file   Social History Narrative   Regular exercise- yes: walking     Current Outpatient Prescriptions on File Prior to Visit  Medication Sig Dispense Refill  . aspirin 81 MG tablet Take 81 mg by mouth daily.        Marland Kitchen atorvastatin (LIPITOR) 10 MG tablet Take 1 tablet (10 mg total) by mouth daily.  90 tablet  1  . azelastine (ASTELIN) 137 MCG/SPRAY nasal spray 2 sprays by Nasal route 2 (two) times daily. Use in each nostril as directed       . beclomethasone (QVAR) 40 MCG/ACT inhaler Inhale 2 puffs into the lungs 2 (two) times daily. Rinse mouth  3 Inhaler  3  . Cholecalciferol (VITAMIN D3) 1000 UNITS CAPS Take 1 capsule by mouth daily.       . Cyanocobalamin (B-12 PO) Take 1 tablet by mouth daily.       . famotidine (PEPCID) 20 MG tablet Take 1 tablet (20 mg total) by mouth 2 (two) times daily.  30 tablet  0  . fexofenadine (ALLEGRA) 180 MG tablet Take 180  mg by mouth daily as needed. For allergies      . fluticasone (FLONASE) 50 MCG/ACT nasal spray 2 sprays by Nasal route daily.        . furosemide (LASIX) 20 MG tablet Take 2 tablets (40 mg total) by mouth daily.  180 tablet  1  . insulin glargine (LANTUS) 100 UNIT/ML injection Inject 30 Units into the skin at bedtime.       . insulin lispro (HUMALOG) 100 UNIT/ML injection 3 times a day (just before each meal) 07-01-13 units      . Insulin Pen Needle (PEN NEEDLES) 31G X 6 MM MISC Use as directed  200 each  3  . Multiple Vitamins-Minerals (CENTRUM SILVER PO) Take 1 tablet by mouth daily.        Marland Kitchen omeprazole (PRILOSEC) 20 MG capsule Take 1 capsule (20 mg total) by mouth daily.  90 capsule  1    No Known Allergies  Family History  Problem Relation Age of Onset  . Heart attack Neg Hx   . Colon cancer Neg Hx   .  Breast cancer Neg Hx     BP 132/84  Pulse 100  Temp 97.6 F (36.4 C) (Oral)  Wt 240 lb (108.863 kg)  SpO2 92%    Review of Systems denies hypoglycemia    Objective:   Physical Exam VITAL SIGNS:  See vs page GENERAL: no distress SKIN:  Insulin injection sites at the anterior abdomen are normal      Assessment & Plan:  DM, Based on the pattern of her cbg's, she needs some adjustment in her therapy

## 2011-12-09 NOTE — Patient Instructions (Addendum)
check your blood sugar 2 times a day.  vary the time of day when you check, between before the 3 meals, and at bedtime.  also check if you have symptoms of your blood sugar being too high or too low.  please keep a record of the readings and bring it to your next appointment here.  please call us sooner if your blood sugar goes below 70, or if you have a lot of readings over 200.  Please decrease lantus to 30 units daily, and:   Increase humalog to 3 times a day (just before each meal) 07-01-13 units.     Please come back for a follow-up appointment in 3 months.

## 2011-12-14 ENCOUNTER — Encounter: Payer: Self-pay | Admitting: Internal Medicine

## 2011-12-14 ENCOUNTER — Ambulatory Visit (INDEPENDENT_AMBULATORY_CARE_PROVIDER_SITE_OTHER): Payer: Medicare Other | Admitting: Internal Medicine

## 2011-12-14 VITALS — BP 130/84 | HR 93 | Temp 98.0°F | Ht 64.25 in | Wt 237.0 lb

## 2011-12-14 DIAGNOSIS — Z0000 Encounter for general adult medical examination without abnormal findings: Secondary | ICD-10-CM | POA: Diagnosis not present

## 2011-12-14 DIAGNOSIS — E559 Vitamin D deficiency, unspecified: Secondary | ICD-10-CM

## 2011-12-14 DIAGNOSIS — E1149 Type 2 diabetes mellitus with other diabetic neurological complication: Secondary | ICD-10-CM | POA: Diagnosis not present

## 2011-12-14 DIAGNOSIS — R059 Cough, unspecified: Secondary | ICD-10-CM

## 2011-12-14 DIAGNOSIS — E569 Vitamin deficiency, unspecified: Secondary | ICD-10-CM

## 2011-12-14 DIAGNOSIS — E785 Hyperlipidemia, unspecified: Secondary | ICD-10-CM | POA: Diagnosis not present

## 2011-12-14 DIAGNOSIS — R05 Cough: Secondary | ICD-10-CM

## 2011-12-14 DIAGNOSIS — T783XXA Angioneurotic edema, initial encounter: Secondary | ICD-10-CM | POA: Diagnosis not present

## 2011-12-14 DIAGNOSIS — E1142 Type 2 diabetes mellitus with diabetic polyneuropathy: Secondary | ICD-10-CM

## 2011-12-14 DIAGNOSIS — I10 Essential (primary) hypertension: Secondary | ICD-10-CM | POA: Diagnosis not present

## 2011-12-14 DIAGNOSIS — E1140 Type 2 diabetes mellitus with diabetic neuropathy, unspecified: Secondary | ICD-10-CM

## 2011-12-14 LAB — LIPID PANEL
HDL: 49.1 mg/dL (ref >39.00)
LDL Cholesterol: 79 mg/dL (ref 0–99)
Total CHOL/HDL Ratio: 3
Triglycerides: 89 mg/dL (ref 0.0–149.0)
VLDL: 17.8 mg/dL (ref 0.0–40.0)

## 2011-12-14 LAB — MICROALBUMIN / CREATININE URINE RATIO: Microalb, Ur: 0.1 mg/dL (ref 0.0–1.9)

## 2011-12-14 MED ORDER — ZOSTER VACCINE LIVE 19400 UNT/0.65ML ~~LOC~~ SOLR: 0.6500 mL | Freq: Once | SUBCUTANEOUS | Status: DC

## 2011-12-14 NOTE — Assessment & Plan Note (Signed)
BP well controlled, not on ACE inhibitors, will check a microalbumin

## 2011-12-14 NOTE — Progress Notes (Signed)
  Subjective:    Patient ID: Angel Green, female    DOB: Feb 28, 1940, 71 y.o.   MRN: [id number]  HPI Here for Medicare AWV:  1. Risk factors based on Past M, S, F history: reviewed 2. Physical Activities:  Active, no routine exercise  3. Depression/mood:  No depression or anxiety 4. Hearing:  No depression or anxiety 5. ADL's:  Independent  6. Fall Risk: Prevention discussed 7. home Safety: does feelsafe at home  8. Height, weight, &visual acuity: see VS, use glasses, sees the eye doctor regularly 9. Counseling: provided 10. Labs ordered based on risk factors: if needed  11. Referral Coordination: if needed 12.  Care Plan, see assessment and plan  13.   Cognitive Assessment: Motor skills appropriate for age, constipation normal.  In addition, today we discussed the following: Qvar was Rx for cough, pt reports great relief Angioedema, allergies: Currently not an issue, taking antihistaminics and nasal sprays as needed. Diabetes, followup by endocrinology, sees the eye Dr. regularly.   Past Medical History:   HYPERTENSION   DIABETES MELLITUS, TYPE II   HYPERLIPIDEMIA   CRI (chronic renal insufficiency)   OSTEOARTHROSIS, GENERALIZED, MULTIPLE SITES   ANKLE PAIN, CHRONIC   RHINITIS, ALLERGIC NOS    Past Surgical History:   Cholecystectomy   Hysterectomy, partial in her 57s Dental surgery, implants  Family History: Stroke--no MI-- B at age ~ 2 DM--no colon ca--no breast ca--no  Social History: Retired, married, husband is a Company secretary  2 children Never Smoked Alcohol use-no Drug use-no Diet-- improving   Review of Systems No chest pain or shortness of breath No nausea, vomiting, diarrhea or blood in the stools. No dysuria gross hematuria.     Objective:   Physical Exam General -- alert, well-developed Neck --no thyromegaly , normal carotid pulse Breasts-- No mass, nodules, thickening, tenderness, bulging, retraction, inflamation, nipple discharge or skin  changes noted.  no axillary lymph nodes Lungs -- normal respiratory effort, no intercostal retractions, no accessory muscle use, and normal breath sounds.   Heart-- normal rate, regular rhythm, no murmur, and no gallop.   Abdomen--soft, non-tender, no distention, no masses, no HSM, no guarding, and no rigidity.   Extremities-- no pretibial edema bilaterally  Neurologic-- alert & oriented X3 and strength normal in all extremities. Psych-- Cognition and judgment appear intact. Alert and cooperative with normal attention span and concentration.  not anxious appearing and not depressed appearing.      Assessment & Plan:

## 2011-12-14 NOTE — Assessment & Plan Note (Addendum)
Good compliance with medication, recheck labs and LFTs

## 2011-12-14 NOTE — Assessment & Plan Note (Signed)
was recommended a trial with Qvar with great response, currently uses it prn

## 2011-12-14 NOTE — Assessment & Plan Note (Signed)
Not an issue at the present time, on as needed medication.

## 2011-12-14 NOTE — Assessment & Plan Note (Addendum)
Td--9-09 pneumonia shot-- 9-09 Had a flu shot  likes to get a shingles shot , Rx provided   last mammogram 09-2011 (-) PAPs, states had several (-) ones after hysterectomy (done for DUB), last PAP 2011 (-); pt is low risk, no further screening Cscope 02-2002 Dr Collene Mares: (-), per report : repeat in 10 years  07-2009 DEXA-- normal, recheck ~ 2014 H/o low vit D, recheck  Diet-exercise discussed

## 2011-12-14 NOTE — Assessment & Plan Note (Signed)
Per Dr. Loanne Drilling not on ACE inhibitors, will check a microalbumin

## 2011-12-20 ENCOUNTER — Encounter: Payer: Self-pay | Admitting: *Deleted

## 2011-12-21 ENCOUNTER — Telehealth: Payer: Self-pay | Admitting: Internal Medicine

## 2011-12-21 MED ORDER — AZELASTINE-FLUTICASONE 137-50 MCG/ACT NA SUSP: 1.0000 | Freq: Every day | NASAL | Status: DC

## 2011-12-21 NOTE — Telephone Encounter (Signed)
Rx has been sent to [facility] per the pt.

## 2011-12-22 ENCOUNTER — Ambulatory Visit: Payer: Medicare Other | Admitting: Internal Medicine

## 2012-01-02 ENCOUNTER — Encounter (HOSPITAL_COMMUNITY): Payer: Self-pay

## 2012-01-02 ENCOUNTER — Emergency Department (HOSPITAL_COMMUNITY)
Admission: EM | Admit: 2012-01-02 | Discharge: 2012-01-02 | Disposition: A | Discharge status: Home / Self Care | Payer: Medicare Other | Attending: Emergency Medicine | Admitting: Emergency Medicine

## 2012-01-02 DIAGNOSIS — N390 Urinary tract infection, site not specified: Secondary | ICD-10-CM | POA: Insufficient documentation

## 2012-01-02 DIAGNOSIS — N189 Chronic kidney disease, unspecified: Secondary | ICD-10-CM | POA: Diagnosis not present

## 2012-01-02 DIAGNOSIS — M1990 Unspecified osteoarthritis, unspecified site: Secondary | ICD-10-CM | POA: Insufficient documentation

## 2012-01-02 DIAGNOSIS — G8929 Other chronic pain: Secondary | ICD-10-CM | POA: Insufficient documentation

## 2012-01-02 DIAGNOSIS — E119 Type 2 diabetes mellitus without complications: Secondary | ICD-10-CM | POA: Insufficient documentation

## 2012-01-02 DIAGNOSIS — M25579 Pain in unspecified ankle and joints of unspecified foot: Secondary | ICD-10-CM | POA: Diagnosis not present

## 2012-01-02 DIAGNOSIS — Z794 Long term (current) use of insulin: Secondary | ICD-10-CM | POA: Diagnosis not present

## 2012-01-02 DIAGNOSIS — Z79899 Other long term (current) drug therapy: Secondary | ICD-10-CM | POA: Insufficient documentation

## 2012-01-02 DIAGNOSIS — E785 Hyperlipidemia, unspecified: Secondary | ICD-10-CM | POA: Diagnosis not present

## 2012-01-02 DIAGNOSIS — Z7982 Long term (current) use of aspirin: Secondary | ICD-10-CM | POA: Diagnosis not present

## 2012-01-02 DIAGNOSIS — J310 Chronic rhinitis: Secondary | ICD-10-CM | POA: Diagnosis not present

## 2012-01-02 DIAGNOSIS — I10 Essential (primary) hypertension: Secondary | ICD-10-CM | POA: Diagnosis not present

## 2012-01-02 LAB — URINALYSIS, ROUTINE W REFLEX MICROSCOPIC
Glucose, UA: NEGATIVE mg/dL
Protein, ur: 100 mg/dL — AB
Specific Gravity, Urine: 1.003 — ABNORMAL LOW (ref 1.005–1.030)
pH: 7 (ref 5.0–8.0)

## 2012-01-02 LAB — URINE MICROSCOPIC-ADD ON

## 2012-01-02 MED ORDER — SULFAMETHOXAZOLE-TMP DS 800-160 MG PO TABS
1.0000 | ORAL_TABLET | Freq: Once | ORAL | Status: AC
  Administered 2012-01-02: 1 via ORAL
  Filled 2012-01-02: qty 1

## 2012-01-02 MED ORDER — SULFAMETHOXAZOLE-TRIMETHOPRIM 800-160 MG PO TABS: 1.0000 | ORAL_TABLET | Freq: Two times a day (BID) | ORAL | Status: DC

## 2012-01-02 NOTE — ED Provider Notes (Signed)
History  This chart was scribed for Blanchie Dessert, MD by Kevan Rosebush. This patient was seen in room TR06C/TR06C and the patient's care was started at 14:34.   CSN: [id number]  Arrival date & time 01/02/12  1336   First MD Initiated Contact with Patient 01/02/12 1434      Chief Complaint  Patient presents with  . Hematuria    (Consider location/radiation/quality/duration/timing/severity/associated sxs/prior treatment) The history is provided by the patient. No language interpreter was used.  Angel Green is a 71 y.o. female who presents to the Emergency Department complaining of urinary frequency, hematuria, and discomfort upon urinating since yesterday. Pt denies any associated abdominal pain, fever, or nausea. Pt is not allergic to any antibiotics.    Past Medical History  Diagnosis Date  . Hypertension   . Diabetes mellitus, type 2   . Hyperlipidemia   . Osteoarthritis   . Ankle pain     chronic  . Rhinitis     allergic nos  . RENAL INSUFFICIENCY, CHRONIC 04/16/2010    Past Surgical History  Procedure Date  . Cholecystectomy   . Partial hysterectomy     in her 33s    Family History  Problem Relation Age of Onset  . Heart attack Neg Hx   . Colon cancer Neg Hx   . Breast cancer Neg Hx     History  Substance Use Topics  . Smoking status: Never Smoker   . Smokeless tobacco: Not on file  . Alcohol Use: No    OB History    Grav Para Term Preterm Abortions TAB SAB Ect Mult Living                  Review of Systems  Constitutional: Negative for fever and chills.  Respiratory: Negative for shortness of breath.   Gastrointestinal: Negative for nausea, vomiting and abdominal pain.  Genitourinary: Positive for frequency and hematuria.  Neurological: Negative for weakness.    Allergies  Review of patient's allergies indicates no known allergies.  Home Medications   Current Outpatient Rx  Name  Route  Sig  Dispense  Refill  . ASPIRIN 81 MG PO  TABS   Oral   Take 81 mg by mouth daily.           . ATORVASTATIN CALCIUM 10 MG PO TABS   Oral   Take 10 mg by mouth daily. BRAND NAME ONLY.         Marland Kitchen AZELASTINE HCL 137 MCG/SPRAY NA SOLN   Nasal   Place 2 sprays into the nose 2 (two) times daily as needed. Use in each nostril as directed         . AZELASTINE-FLUTICASONE 137-50 MCG/ACT NA SUSP   Nasal   Place 1-2 sprays into the nose at bedtime.   3 Bottle   1   . BECLOMETHASONE DIPROPIONATE 40 MCG/ACT IN AERS   Inhalation   Inhale 2 puffs into the lungs 2 (two) times daily. Rinse mouth   3 Inhaler   3   . VITAMIN D3 1000 UNITS PO CAPS   Oral   Take 1 capsule by mouth daily.          . B-12 PO   Oral   Take 1 tablet by mouth daily.          Marland Kitchen FEXOFENADINE HCL 180 MG PO TABS   Oral   Take 180 mg by mouth daily as needed. For allergies         .  FLUTICASONE PROPIONATE 50 MCG/ACT NA SUSP   Nasal   Place 2 sprays into the nose daily as needed. For nasal irritation         . FUROSEMIDE 20 MG PO TABS   Oral   Take 40 mg by mouth daily. BRAND NAME ONLY.         Marland Kitchen INSULIN GLARGINE 100 UNIT/ML [place] SOLN   Subcutaneous   Inject 30 Units into the skin at bedtime.          . INSULIN LISPRO (HUMAN) 100 UNIT/ML [place] SOLN      3 times a day (just before each meal) 07-01-13 units         . PEN NEEDLES 31G X 6 MM MISC      Use as directed   200 each   3     Patient Requires Extra Quantity Dispensed for [2]  ...   . CENTRUM SILVER PO   Oral   Take 1 tablet by mouth daily.           Marland Kitchen OMEPRAZOLE 20 MG PO CPDR   Oral   Take 1 capsule (20 mg total) by mouth daily.   90 capsule   1   . ZOSTER VACCINE LIVE 19400 UNT/0.65ML [place] SOLR   Subcutaneous   Inject 19,400 Units into the skin once.   1 each   0     Triage Vitals: BP 151/124  Pulse 100  Temp 98 F (36.7 C) (Oral)  Resp 16  Ht 5' 4.75" (1.645 m)  Wt 235 lb (106.595 kg)  BMI 39.41 kg/m2  SpO2 97%  Physical Exam  Nursing note and  vitals reviewed. Constitutional: She is oriented to person, place, and time. She appears well-developed and well-nourished. No distress.  HENT:  Head: Normocephalic and atraumatic.  Eyes: EOM are normal. Pupils are equal, round, and reactive to light.  Neck: Neck supple. No tracheal deviation present.  Cardiovascular: Normal rate.   Pulmonary/Chest: Effort normal. No respiratory distress.  Abdominal: Soft. She exhibits no distension.       No suprapubic tenderness.  Musculoskeletal: Normal range of motion. She exhibits no edema.       No CVA tenderness.  Neurological: She is alert and oriented to person, place, and time.  Skin: Skin is warm and dry.  Psychiatric: She has a normal mood and affect.    ED Course  Procedures (including critical care time) DIAGNOSTIC STUDIES: Oxygen Saturation is 97% on room air, adequate by my interpretation.    COORDINATION OF CARE: 14:48--I evaluated the patient and we discussed a treatment plan including antibiotics to which the pt agreed. I notified the pt that her urinalysis shows she has a UTI.   Labs Reviewed  URINALYSIS, ROUTINE W REFLEX MICROSCOPIC - Abnormal; Notable for the following:    Color, Urine RED (*)  BIOCHEMICALS MAY BE AFFECTED BY COLOR   APPearance CLOUDY (*)     Specific Gravity, Urine 1.003 (*)     Hgb urine dipstick LARGE (*)     Protein, ur 100 (*)     Leukocytes, UA LARGE (*)     All other components within normal limits  URINE MICROSCOPIC-ADD ON - Abnormal; Notable for the following:    Squamous Epithelial / LPF FEW (*)     All other components within normal limits  URINE CULTURE   No results found.   1. UTI (lower urinary tract infection)       MDM   Patient  with symptoms most suggestive of a urinary tract infection. She denies any symptoms suggestive of hilar. She is well-appearing and has no other complaints. Mild elevation of her blood pressure today 151/124. She does take blood pressure medication. It was  suggested that she continue to watch this. UA suggestive of urinary tract infection and patient was started on Bactrim. She will followup with her PCP for further issues     I personally performed the services described in this documentation, which was scribed in my presence.  The recorded information has been reviewed and considered.    Blanchie Dessert, MD 01/02/12 [phone]

## 2012-01-02 NOTE — ED Notes (Signed)
Woke up with urinary frequency and hematuria.  Denies any pain .

## 2012-01-04 LAB — URINE CULTURE: Colony Count: 30000

## 2012-01-10 ENCOUNTER — Telehealth: Payer: Self-pay | Admitting: *Deleted

## 2012-01-10 MED ORDER — GLUCOSE BLOOD VI STRP: ORAL_STRIP | Status: DC

## 2012-01-10 MED ORDER — PEN NEEDLES 31G X 6 MM MISC: Status: DC

## 2012-01-10 MED ORDER — INSULIN LISPRO 100 UNIT/ML ~~LOC~~ SOLN: SUBCUTANEOUS | Status: DC

## 2012-01-10 NOTE — Telephone Encounter (Signed)
Pt called requesting refills for clever choice test strips & new device. I called [facility] & spoke to [place] to make sure the correct device was sent to pt.

## 2012-01-26 ENCOUNTER — Telehealth: Payer: Self-pay | Admitting: *Deleted

## 2012-01-26 NOTE — Telephone Encounter (Signed)
Pt left msg on vmail requesting a call back. Returned pt's call with no answer.

## 2012-02-07 NOTE — Telephone Encounter (Signed)
Pt called today stating that she has not received her clever choice voice glucometer or the clever choice lancets. I called [facility] & spoke with jake. Maylon Cos stated that this requires a prior auth. Called pt & made her aware.

## 2012-02-11 DIAGNOSIS — H04129 Dry eye syndrome of unspecified lacrimal gland: Secondary | ICD-10-CM | POA: Diagnosis not present

## 2012-02-11 DIAGNOSIS — E119 Type 2 diabetes mellitus without complications: Secondary | ICD-10-CM | POA: Diagnosis not present

## 2012-02-11 DIAGNOSIS — Z961 Presence of intraocular lens: Secondary | ICD-10-CM | POA: Diagnosis not present

## 2012-02-11 DIAGNOSIS — H40029 Open angle with borderline findings, high risk, unspecified eye: Secondary | ICD-10-CM | POA: Diagnosis not present

## 2012-02-11 LAB — HM DIABETES EYE EXAM

## 2012-03-02 ENCOUNTER — Telehealth: Payer: Self-pay | Admitting: *Deleted

## 2012-03-02 MED ORDER — INSULIN LISPRO 100 UNIT/ML ~~LOC~~ SOLN: SUBCUTANEOUS | Status: DC

## 2012-03-02 NOTE — Telephone Encounter (Signed)
Refill done.  

## 2012-03-09 ENCOUNTER — Encounter: Payer: Self-pay | Admitting: *Deleted

## 2012-03-14 ENCOUNTER — Other Ambulatory Visit: Payer: Self-pay | Admitting: Internal Medicine

## 2012-03-14 MED ORDER — BECLOMETHASONE DIPROPIONATE 40 MCG/ACT IN AERS: 2.0000 | INHALATION_SPRAY | Freq: Two times a day (BID) | RESPIRATORY_TRACT | Status: DC

## 2012-03-15 ENCOUNTER — Encounter: Payer: Self-pay | Admitting: Endocrinology

## 2012-03-15 ENCOUNTER — Ambulatory Visit (INDEPENDENT_AMBULATORY_CARE_PROVIDER_SITE_OTHER): Payer: Medicare Other | Admitting: Endocrinology

## 2012-03-15 VITALS — BP 132/76 | HR 94 | Temp 97.8°F | Wt 234.0 lb

## 2012-03-15 DIAGNOSIS — E119 Type 2 diabetes mellitus without complications: Secondary | ICD-10-CM | POA: Diagnosis not present

## 2012-03-15 DIAGNOSIS — E1149 Type 2 diabetes mellitus with other diabetic neurological complication: Secondary | ICD-10-CM | POA: Diagnosis not present

## 2012-03-15 DIAGNOSIS — E1140 Type 2 diabetes mellitus with diabetic neuropathy, unspecified: Secondary | ICD-10-CM

## 2012-03-15 DIAGNOSIS — E1142 Type 2 diabetes mellitus with diabetic polyneuropathy: Secondary | ICD-10-CM | POA: Diagnosis not present

## 2012-03-15 LAB — HEMOGLOBIN A1C: Hgb A1c MFr Bld: 7.5 % — ABNORMAL HIGH (ref 4.6–6.5)

## 2012-03-15 NOTE — Patient Instructions (Addendum)
check your blood sugar 2 times a day.  vary the time of day when you check, between before the 3 meals, and at bedtime.  also check if you have symptoms of your blood sugar being too high or too low.  please keep a record of the readings and bring it to your next appointment here.  please call us sooner if your blood sugar goes below 70, or if you have a lot of readings over 200.  blood tests are being requested for you today.  We'll contact you with results. Please come back for a follow-up appointment in 4 months.

## 2012-03-15 NOTE — Progress Notes (Signed)
Subjective:    Patient ID: Angel Green, female    DOB: [birth year], 72 y.o.   MRN: [id number]  HPI pt returns for f/u of insulin-requiring DM (dx'ed [id number]; complicated by renal insufficiency; she has been on insulin since 2010).  no cbg record, but states cbg's vary from 75-110.  It is in general higher as the day goes on.   pt states she feels well in general. Past Medical History  Diagnosis Date  . Hypertension   . Diabetes mellitus, type 2   . Hyperlipidemia   . Osteoarthritis   . Ankle pain     chronic  . Rhinitis     allergic nos  . RENAL INSUFFICIENCY, CHRONIC 04/16/2010    Past Surgical History  Procedure Date  . Cholecystectomy   . Partial hysterectomy     in her 30s    History   Social History  . Marital Status: Married    Spouse Name: N/A    Number of Children: 2  . Years of Education: N/A   Occupational History  . retired-banking business    Social History Main Topics  . Smoking status: Never Smoker   . Smokeless tobacco: Not on file  . Alcohol Use: No  . Drug Use: No  . Sexually Active: Not on file   Other Topics Concern  . Not on file   Social History Narrative   Regular exercise- yes: walking     Current Outpatient Prescriptions on File Prior to Visit  Medication Sig Dispense Refill  . aspirin 81 MG tablet Take 81 mg by mouth daily.        Marland Kitchen atorvastatin (LIPITOR) 10 MG tablet Take 10 mg by mouth daily. BRAND NAME ONLY.      Marland Kitchen azelastine (ASTELIN) 137 MCG/SPRAY nasal spray Place 2 sprays into the nose 2 (two) times daily as needed. Use in each nostril as directed      . Azelastine-Fluticasone 137-50 MCG/ACT SUSP Place 1-2 sprays into the nose at bedtime.  3 Bottle  1  . beclomethasone (QVAR) 40 MCG/ACT inhaler Inhale 2 puffs into the lungs 2 (two) times daily. Rinse mouth  3 Inhaler  1  . Cholecalciferol (VITAMIN D3) 1000 UNITS CAPS Take 1 capsule by mouth daily.       . Cyanocobalamin (B-12 PO) Take 1 tablet by mouth daily.       .  fexofenadine (ALLEGRA) 180 MG tablet Take 180 mg by mouth daily as needed. For allergies      . fluticasone (FLONASE) 50 MCG/ACT nasal spray Place 2 sprays into the nose daily as needed. For nasal irritation      . furosemide (LASIX) 20 MG tablet Take 40 mg by mouth daily. BRAND NAME ONLY.      Marland Kitchen glucose blood test strip Use as instructed  100 each  12  . insulin glargine (LANTUS) 100 UNIT/ML injection Inject 30 Units into the skin at bedtime.       . insulin lispro (HUMALOG) 100 UNIT/ML injection 3 times a day (just before each meal) 07-01-13 units  10 mL  3  . Insulin Pen Needle (PEN NEEDLES) 31G X 6 MM MISC Use as directed  200 each  3  . Multiple Vitamins-Minerals (CENTRUM SILVER PO) Take 1 tablet by mouth daily.        Marland Kitchen omeprazole (PRILOSEC) 20 MG capsule Take 1 capsule (20 mg total) by mouth daily.  90 capsule  1  . sulfamethoxazole-trimethoprim (SEPTRA DS) 800-160 MG per tablet  Take 1 tablet by mouth every 12 (twelve) hours.  14 tablet  0  . zoster vaccine live, PF, (ZOSTAVAX) 36644 UNT/0.65ML injection Inject 19,400 Units into the skin once.  1 each  0    No Known Allergies  Family History  Problem Relation Age of Onset  . Heart attack Neg Hx   . Colon cancer Neg Hx   . Breast cancer Neg Hx     BP 132/76  Pulse 94  Temp 97.8 F (36.6 C) (Oral)  Wt 234 lb (106.142 kg)  SpO2 98%  Review of Systems denies hypoglycemia    Objective:   Physical Exam VITAL SIGNS:  See vs page GENERAL: no distress Pulses: dorsalis pedis intact bilat.   Feet: no deformity.  no ulcer on the feet.  feet are of normal color and temp.  no edema Neuro: sensation is intact to touch on the feet.     Assessment & Plan:  DM, apparently well-controlled

## 2012-04-17 ENCOUNTER — Other Ambulatory Visit: Payer: Self-pay | Admitting: Internal Medicine

## 2012-04-17 NOTE — Telephone Encounter (Signed)
Refill done.  

## 2012-05-03 ENCOUNTER — Other Ambulatory Visit: Payer: Self-pay | Admitting: *Deleted

## 2012-05-03 MED ORDER — PEN NEEDLES 31G X 6 MM MISC: Status: DC

## 2012-05-03 NOTE — Telephone Encounter (Signed)
Refill for pen needles sent to [facility]

## 2012-05-20 ENCOUNTER — Other Ambulatory Visit: Payer: Self-pay | Admitting: Internal Medicine

## 2012-05-22 NOTE — Telephone Encounter (Signed)
Refill done.  

## 2012-05-30 ENCOUNTER — Ambulatory Visit (INDEPENDENT_AMBULATORY_CARE_PROVIDER_SITE_OTHER): Payer: Medicare Other | Admitting: Internal Medicine

## 2012-05-30 ENCOUNTER — Encounter: Payer: Self-pay | Admitting: Internal Medicine

## 2012-05-30 VITALS — BP 136/80 | HR 113 | Ht 63.75 in | Wt 238.6 lb

## 2012-05-30 DIAGNOSIS — K219 Gastro-esophageal reflux disease without esophagitis: Secondary | ICD-10-CM

## 2012-05-30 DIAGNOSIS — J302 Other seasonal allergic rhinitis: Secondary | ICD-10-CM

## 2012-05-30 DIAGNOSIS — J309 Allergic rhinitis, unspecified: Secondary | ICD-10-CM

## 2012-05-30 MED ORDER — AZELASTINE-FLUTICASONE 137-50 MCG/ACT NA SUSP: 1.0000 | Freq: Every day | NASAL | Status: DC

## 2012-05-30 NOTE — Progress Notes (Signed)
Patient ID: Angel Green, female    DOB: 27-Jan-1941, 72 y.o.   MRN: [id number]  HPI 08/12/10 -40 yoF never smoker, seen at kind request of Dr Larose Kells. Complains that for 3 years she has had excessive phlegm/ congestion in chest and throat. this is present most of the time. Nasal sprays reduce the drainage. Feels that this condition makes her weak and washed out. Symptoms worse in Spring and with weather changes. Seemed better while she was being treated for a cold. Denies dysphagia or reflux but has been told she has GERD. Nasal surgery-? Septoplasty?in past. Allergy skin tested twice with little positive response. Denies asthma, skin rash or relation to meals. Astelin and Flonase used intermittently.  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 Retired from [employer] work. CXR- neg, NAD 06/30/10  09/17/10- 08/12/10 -72 yoF never smoker,  Followed for allergic rhinitis, bronchitis. Complains that for 3 years she has had excessive phlegm/ congestion in chest and throat. Last visit we gave Qvar- big help; allegra- not much help by itself. 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 Lemonade drink mix makes phlegm come.   12/18/10--70 yoF never smoker,  Followed for allergic rhinitis, bronchitis. Complains that for 3 years she has had excessive phlegm/ congestion in chest and throat. Had flu vaccine. No acute problems since last here and feels well. Minor sniffling just in the last day but no cough, chest pain or significant sinus discomfort. She continues Qvar 40.  10/15/11- -70 yoF never smoker,  Followed for allergic rhinitis, bronchitis.  New problem-urticaria/angioedema. Had a viral URI syndrome then woke August 2 with facial and tongue swelling, hard to talk. Involvement especially in left cheek and left side of tongue. ER visit questioned allergy and treated with Benadryl 50 mg plus Pepcid 40 mg which continue, and therefore day course of prednisone. Her URI symptoms have been treated with Mucinex  and cough syrup. Allergy Profile 08/13/2010-total IgE 21.4, negative for specific elevations.  11/30/11- 40 yoF never smoker,  Followed for allergic rhinitis, bronchitis, urticaria/angioedemathese No swelling in tongue or face since last visit Still postnasal drip which she treats with Astelin. Dry cough when she lies down.  05/30/12- 72 yoF never smoker,  Followed for allergic rhinitis, bronchitis. Follows For:  Denies excess swelling at this time - Seasonal allergy complaints - runny nose , itchy eyes, sneezing Likes Dymista nasal spray. Has had no more episodes of angioedema or hives. Off Allegra in the last few days has had increased itching and watery nose. Coughs while lying down. Does not feel reflux while taking Prilosec. CXR-06/16/11  IMPRESSION:  No evidence of acute cardiopulmonary disease.  Original Report Authenticated By: Julian Hy, M.D.   Review of Systems-HPI Constitutional:   No-   weight loss, night sweats, fevers, chills, fatigue, lassitude. HEENT:   No-  headaches, difficulty swallowing, tooth/dental problems, sore throat,       + sneezing, itching, ear ache, +nasal congestion, post nasal drip,  CV:  No-   chest pain, orthopnea, PND, swelling in lower extremities, anasarca, dizziness, palpitations Resp: No-   shortness of breath with exertion or at rest.             No-  productive cough,  + non-productive cough,  No- coughing up of blood.              No-   change in color of mucus.  No- wheezing.   Skin: No-   rash or lesions. GI:  No-  heartburn, indigestion, abdominal pain, nausea, vomiting,  GU:  MS:  No-   joint pain or swelling.   Neuro-     nothing unusual Psych:  No- change in mood or affect. No depression or anxiety.  No memory loss.   Objective:   Physical Exam  General- Alert, Oriented, Affect-appropriate, Distress- none acute    obese Skin- rash-none, lesions- none, excoriation- none Lymphadenopathy- none Head- atraumatic. No facial  asymmetry. Left parotid area seems normal.            Eyes- Gross vision intact, PERRLA, conjunctivae clear secretions            Ears- Hearing, canals- normal            Nose- Clear, No-Septal dev, mucus, polyps, erosion, perforation             Throat- Mallampati II , +posterior pharynx is red and glandular , drainage- none, tonsils- atrophic Neck- flexible , trachea midline, no stridor , thyroid nl, carotid no bruit Chest - symmetrical excursion , unlabored           Heart/CV- RRR , no murmur , no gallop  , no rub, nl s1 s2                           - JVD- none , edema- none, stasis changes- none, varices- none           Lung- clear to P&A, wheeze- none, cough- none , dullness-none, rub- none           Chest wall-  Abd-  Br/ Gen/ Rectal- Not done, not indicated Extrem- cyanosis- none, clubbing, none, atrophy- none, strength- nl Neuro- grossly intact to observation

## 2012-05-30 NOTE — Patient Instructions (Addendum)
Ok to continue Dymista for as long as needed   3 month script for mail off.  Ok to take Allegra/ fexofenadine daily if needed  Please call as needed

## 2012-06-06 NOTE — Assessment & Plan Note (Signed)
Seasonal exacerbation. Plan-restart Allegra. See if this also reduces her cough which may be from postnasal drip.

## 2012-06-06 NOTE — Assessment & Plan Note (Signed)
We reviewed reflux precautions. Watch for possible relation to cough.

## 2012-06-13 ENCOUNTER — Ambulatory Visit: Payer: Medicare Other | Admitting: Internal Medicine

## 2012-06-13 DIAGNOSIS — Z0289 Encounter for other administrative examinations: Secondary | ICD-10-CM

## 2012-06-16 ENCOUNTER — Telehealth: Payer: Self-pay | Admitting: Internal Medicine

## 2012-06-16 MED ORDER — INSULIN LISPRO 100 UNIT/ML ~~LOC~~ SOLN: SUBCUTANEOUS | Status: DC

## 2012-06-16 NOTE — Telephone Encounter (Signed)
Patient states that she needs refills of humalog sent to [facility]

## 2012-06-16 NOTE — Telephone Encounter (Signed)
Refill done.  

## 2012-06-20 ENCOUNTER — Encounter: Payer: Self-pay | Admitting: Family Medicine

## 2012-06-20 ENCOUNTER — Ambulatory Visit (INDEPENDENT_AMBULATORY_CARE_PROVIDER_SITE_OTHER): Payer: Medicare Other | Admitting: Family Medicine

## 2012-06-20 VITALS — BP 126/74 | HR 97 | Temp 99.1°F | Wt 237.6 lb

## 2012-06-20 DIAGNOSIS — R05 Cough: Secondary | ICD-10-CM | POA: Diagnosis not present

## 2012-06-20 DIAGNOSIS — J019 Acute sinusitis, unspecified: Secondary | ICD-10-CM | POA: Diagnosis not present

## 2012-06-20 DIAGNOSIS — R059 Cough, unspecified: Secondary | ICD-10-CM

## 2012-06-20 MED ORDER — CEFUROXIME AXETIL 500 MG PO TABS: 500.0000 mg | ORAL_TABLET | Freq: Two times a day (BID) | ORAL | Status: DC

## 2012-06-20 MED ORDER — HYDROCODONE-HOMATROPINE 5-1.5 MG/5ML PO SYRP: 5.0000 mL | ORAL_SOLUTION | Freq: Four times a day (QID) | ORAL | Status: DC | PRN

## 2012-06-20 NOTE — Patient Instructions (Signed)

## 2012-06-20 NOTE — Progress Notes (Signed)
  Subjective:     Angel Green is a 72 y.o. female who presents for evaluation of sinus pain. Symptoms include: congestion, facial pain, headaches, nasal congestion and sinus pressure. Onset of symptoms was 5 weeks ago. Symptoms have been gradually worsening since that time. Past history is significant for no history of pneumonia or bronchitis. Patient is a non-smoker.  The following portions of the patient's history were reviewed and updated as appropriate: allergies, current medications, past family history, past medical history, past social history, past surgical history and problem list.  Review of Systems Pertinent items are noted in HPI.   Objective:    BP 126/74  Pulse 97  Temp(Src) 99.1 F (37.3 C) (Oral)  Wt 237 lb 9.6 oz (107.775 kg)  BMI 41.12 kg/m2  SpO2 98% General appearance: alert, cooperative, appears stated age and no distress Ears: normal TM's and external ear canals both ears Nose: green discharge, moderate congestion, turbinates red, swollen, sinus tenderness bilateral Throat: abnormal findings: mild oropharyngeal erythema and pnd Neck: mild anterior cervical adenopathy, supple, symmetrical, trachea midline and thyroid not enlarged, symmetric, no tenderness/mass/nodules Lungs: clear to auscultation bilaterally Heart: S1, S2 normal    Assessment:    Acute bacterial sinusitis.    Plan:    Nasal steroids per medication orders. Antihistamines per medication orders. Ceftin per medication orders.

## 2012-06-27 ENCOUNTER — Encounter: Payer: Self-pay | Admitting: Internal Medicine

## 2012-06-27 ENCOUNTER — Ambulatory Visit (INDEPENDENT_AMBULATORY_CARE_PROVIDER_SITE_OTHER): Payer: Medicare Other | Admitting: Internal Medicine

## 2012-06-27 VITALS — BP 128/82 | HR 91 | Temp 98.6°F | Wt 238.0 lb

## 2012-06-27 DIAGNOSIS — J42 Unspecified chronic bronchitis: Secondary | ICD-10-CM | POA: Diagnosis not present

## 2012-06-27 DIAGNOSIS — R059 Cough, unspecified: Secondary | ICD-10-CM

## 2012-06-27 DIAGNOSIS — I10 Essential (primary) hypertension: Secondary | ICD-10-CM

## 2012-06-27 DIAGNOSIS — R05 Cough: Secondary | ICD-10-CM

## 2012-06-27 MED ORDER — AZITHROMYCIN 250 MG PO TABS: ORAL_TABLET | ORAL | Status: DC

## 2012-06-27 MED ORDER — HYDROCODONE-HOMATROPINE 5-1.5 MG/5ML PO SYRP: 5.0000 mL | ORAL_SOLUTION | Freq: Four times a day (QID) | ORAL | Status: DC | PRN

## 2012-06-27 NOTE — Assessment & Plan Note (Signed)
On Qvar, having intercurrent bronchitis/URI. No wheezing today. Continue with Qvar

## 2012-06-27 NOTE — Assessment & Plan Note (Signed)
Well-controlled,  check a BMP 

## 2012-06-27 NOTE — Patient Instructions (Addendum)
Rest, fluids , tylenol For cough, take Mucinex DM twice a day as needed  If the cough continue, use hydrocodone, watch for somnolence Stop ceftin, start a zpack Call if no better in few days Call anytime if the symptoms are severe ----- All other medications at the same Check the  blood pressure 2 or 3 times a week, be sure it is between 110/60 and 140/85. If it is consistently higher or lower, let me know Next visit in 6 months, fasting for a physical exam.

## 2012-06-27 NOTE — Progress Notes (Signed)
  Subjective:    Patient ID: Angel Green, female    DOB: June 10, 1940, 72 y.o.   MRN: [id number]  HPI ROV  hypertension, good ambulatory BPs. High cholesterol, good medication compliance with Lipitor. Was recently seen with URI, Still having a lot of cough and sputum production,sputum has been colored but now is getting clear. The patient is quite disturbed because she has urinary incontinence with cough. The cough syrup is not making her sleepy.   Past Medical History  Diagnosis Date  . Hypertension   . Diabetes mellitus, type 2   . Hyperlipidemia   . Osteoarthritis   . Ankle pain     chronic  . Rhinitis     allergic nos  . RENAL INSUFFICIENCY, CHRONIC 04/16/2010    Past Surgical History  Procedure Laterality Date  . Cholecystectomy    . Partial hysterectomy      in her 30s   History   Social History  . Marital Status: Married    Spouse Name: N/A    Number of Children: 2  . Years of Education: N/A   Occupational History  .  retired    Social History Main Topics  . Smoking status: Never Smoker   . Smokeless tobacco: Never Used  . Alcohol Use: No  . Drug Use: No  . Sexually Active: Not on file   Other Topics Concern  . Not on file   Social History Narrative    husband is a Company secretary                Review of Systems Denies fever or chills. No nausea, vomiting, diarrhea. No dysuria or gross hematuria. She's taking OTC vitamin D it regularly.     Objective:   Physical Exam BP 128/82  Pulse 91  Temp(Src) 98.6 F (37 C) (Oral)  Wt 238 lb (107.956 kg)  BMI 41.19 kg/m2  SpO2 97%  General -- alert, well-developed,No apparent distress HEENT -- TMs normal, throat w/o redness, face symmetric and not tender to palpation, Nose moderately congested Lungs -- normal respiratory effort, no intercostal retractions, no accessory muscle use, and normal breath sounds.   Heart-- normal rate, regular rhythm, no murmur, and no gallop.   Neurologic-- alert &  oriented X3 and strength normal in all extremities. Psych-- Cognition and judgment appear intact. Alert and cooperative with normal attention span and concentration.  not anxious appearing and not depressed appearing.      Assessment & Plan:  URI, bronchitis. Quite disturbed by cough, on Ceftin. Plan:  Switch to a Z-Pak, refill Hydrocodone as she  is tolerating it well. If urinary incontinence continue after cough improves, will let me know  Vitamin D elevated, decrease vitamin D from 1000 units daily to 400 units daily

## 2012-07-10 ENCOUNTER — Ambulatory Visit (INDEPENDENT_AMBULATORY_CARE_PROVIDER_SITE_OTHER): Payer: Medicare Other | Admitting: Endocrinology

## 2012-07-10 VITALS — BP 132/78 | HR 104 | Ht 64.0 in | Wt 236.0 lb

## 2012-07-10 DIAGNOSIS — E119 Type 2 diabetes mellitus without complications: Secondary | ICD-10-CM | POA: Diagnosis not present

## 2012-07-10 LAB — HEMOGLOBIN A1C: Hgb A1c MFr Bld: 7.5 % — ABNORMAL HIGH (ref 4.6–6.5)

## 2012-07-10 NOTE — Patient Instructions (Addendum)
check your blood sugar 2 times a day.  vary the time of day when you check, between before the 3 meals, and at bedtime.  also check if you have symptoms of your blood sugar being too high or too low.  please keep a record of the readings and bring it to your next appointment here.  please call us sooner if your blood sugar goes below 70, or if you have a lot of readings over 200.  blood tests are being requested for you today.  We'll contact you with results. Please come back for a follow-up appointment in 3 months. Please increase humalog to 3 times a day (just before each meal) 07-02-23 units  Please reduce lantus to 20 units at bedtime.

## 2012-07-10 NOTE — Progress Notes (Signed)
Subjective:    Patient ID: Angel Green, female    DOB: 1940/05/11, 72 y.o.   MRN: [id number]  HPI pt returns for f/u of insulin-requiring DM (dx'ed 1999, not in the context of pancreatitis; she has assoc neuropathy of the lower extremities and moderate renal insufficiency; she has been on insulin since 2010; she has never had severe hypoglycemia or DKA).  no cbg record, but states cbg's vary from 72-100's.  It is lowest in am, and highest at hs.   Past Medical History  Diagnosis Date  . Hypertension   . Diabetes mellitus, type 2   . Hyperlipidemia   . Osteoarthritis   . Ankle pain     chronic  . Rhinitis     allergic nos  . RENAL INSUFFICIENCY, CHRONIC 04/16/2010    Past Surgical History  Procedure Laterality Date  . Cholecystectomy    . Partial hysterectomy      in her 61s  . Dental implants      History   Social History  . Marital Status: Married    Spouse Name: N/A    Number of Children: 2  . Years of Education: N/A   Occupational History  .  retired    Social History Main Topics  . Smoking status: Never Smoker   . Smokeless tobacco: Never Used  . Alcohol Use: No  . Drug Use: No  . Sexually Active: Not on file   Other Topics Concern  . Not on file   Social History Narrative    husband is a Company secretary                Current Outpatient Prescriptions on File Prior to Visit  Medication Sig Dispense Refill  . aspirin 81 MG tablet Take 81 mg by mouth daily.        Marland Kitchen atorvastatin (LIPITOR) 10 MG tablet Take 10 mg by mouth daily. BRAND NAME ONLY.      Marland Kitchen Azelastine-Fluticasone 137-50 MCG/ACT SUSP Place 1-2 sprays into the nose at bedtime.  3 Bottle  3  . beclomethasone (QVAR) 40 MCG/ACT inhaler Inhale 2 puffs into the lungs 2 (two) times daily. Rinse mouth  3 Inhaler  1  . Cyanocobalamin (B-12 PO) Take 1 tablet by mouth daily.       . fexofenadine (ALLEGRA) 180 MG tablet Take 180 mg by mouth daily as needed. For allergies      . furosemide (LASIX) 20 MG  tablet Take 40 mg by mouth daily. BRAND NAME ONLY.      Marland Kitchen glucose blood test strip Use as instructed  100 each  12  . HYDROcodone-homatropine (HYCODAN) 5-1.5 MG/5ML syrup Take 5 mLs by mouth every 6 (six) hours as needed for cough.  120 mL  0  . Insulin Pen Needle (PEN NEEDLES) 31G X 6 MM MISC Use as directed  400 each  3  . Multiple Vitamins-Minerals (CENTRUM SILVER PO) Take 1 tablet by mouth daily.        Marland Kitchen omeprazole (PRILOSEC) 20 MG capsule TAKE 1 CAPSULE DAILY  90 capsule  1   No current facility-administered medications on file prior to visit.    No Known Allergies  Family History  Problem Relation Age of Onset  . Heart attack Neg Hx   . Colon cancer Neg Hx   . Breast cancer Neg Hx    BP 132/78  Pulse 104  Ht 5\' 4"  (1.626 m)  Wt 236 lb (107.049 kg)  BMI 40.49 kg/m2  SpO2  98%  Review of Systems Denies LOC    Objective:   Physical Exam VITAL SIGNS:  See vs page GENERAL: no distress   Lab Results  Component Value Date   HGBA1C 7.5* 07/10/2012      Assessment & Plan:  DM: Based on the pattern of her cbg's, she needs some adjustment in her therapy.  Benefit of improved control should be weighted against risk of hypoglycemia.  Renal insuff.  This increases the risk of hypoglycemia.

## 2012-07-16 ENCOUNTER — Telehealth: Payer: Self-pay | Admitting: Internal Medicine

## 2012-07-16 NOTE — Telephone Encounter (Signed)
BMP ordered 06-27-12, dx CRI not done, please arrange

## 2012-07-19 ENCOUNTER — Other Ambulatory Visit (INDEPENDENT_AMBULATORY_CARE_PROVIDER_SITE_OTHER): Payer: Medicare Other

## 2012-07-19 DIAGNOSIS — I10 Essential (primary) hypertension: Secondary | ICD-10-CM | POA: Diagnosis not present

## 2012-07-19 DIAGNOSIS — N189 Chronic kidney disease, unspecified: Secondary | ICD-10-CM

## 2012-07-19 NOTE — Telephone Encounter (Signed)
Pt coming in today, 5/28, for labs.

## 2012-07-20 LAB — BASIC METABOLIC PANEL
CO2: 28 mEq/L (ref 19–32)
Calcium: 9.7 mg/dL (ref 8.4–10.5)
Chloride: 103 mEq/L (ref 96–112)
Glucose, Bld: 160 mg/dL — ABNORMAL HIGH (ref 70–99)
Sodium: 138 mEq/L (ref 135–145)

## 2012-07-21 ENCOUNTER — Encounter: Payer: Self-pay | Admitting: Internal Medicine

## 2012-07-26 ENCOUNTER — Other Ambulatory Visit: Payer: Self-pay | Admitting: General Practice

## 2012-07-26 MED ORDER — PEN NEEDLES 31G X 6 MM MISC: Status: DC

## 2012-07-26 NOTE — Telephone Encounter (Signed)
Med filled.  

## 2012-07-27 ENCOUNTER — Other Ambulatory Visit: Payer: Self-pay | Admitting: Internal Medicine

## 2012-07-27 NOTE — Telephone Encounter (Signed)
Refill done.  

## 2012-07-28 ENCOUNTER — Encounter: Payer: Self-pay | Admitting: *Deleted

## 2012-08-02 ENCOUNTER — Telehealth: Payer: Self-pay | Admitting: *Deleted

## 2012-08-02 NOTE — Telephone Encounter (Signed)
Pt left VM that she would like a call back to go over results and schedule OV. Called Pt back reviewed labs with Pt. Pt verbalized understanding and had no further concerns. Pt already schedule for appt. Advise Pt that she already has appt informed her of date and time.

## 2012-08-15 ENCOUNTER — Encounter: Payer: Self-pay | Admitting: Endocrinology

## 2012-08-15 ENCOUNTER — Ambulatory Visit (INDEPENDENT_AMBULATORY_CARE_PROVIDER_SITE_OTHER): Payer: Medicare Other | Admitting: Endocrinology

## 2012-08-15 VITALS — BP 130/76 | HR 76 | Wt 234.0 lb

## 2012-08-15 DIAGNOSIS — E119 Type 2 diabetes mellitus without complications: Secondary | ICD-10-CM | POA: Diagnosis not present

## 2012-08-15 NOTE — Progress Notes (Signed)
Subjective:    Patient ID: Angel Green, female    DOB: 1940-08-01, 72 y.o.   MRN: [id number]  HPI pt returns for f/u of insulin-requiring DM (dx'ed 1999, not in the context of pancreatitis; she has moderate neuropathy of the lower extremities and associated renal insufficiency; she has been on insulin since 2010; she has never had severe hypoglycemia or DKA).  no cbg record, but states cbg's vary from 97- mid-100's.  There is no trend throughout the day.  She c/o intermittent excessive diaphoresis and visual scotoma (she saw opthal for this).  She sometimes eats at hs.  She only takes humalog 3 times a day (just before each meal) 07-01-13 units. Past Medical History  Diagnosis Date  . Hypertension   . Diabetes mellitus, type 2   . Hyperlipidemia   . Osteoarthritis   . Ankle pain     chronic  . Rhinitis     allergic nos  . RENAL INSUFFICIENCY, CHRONIC 04/16/2010    Past Surgical History  Procedure Laterality Date  . Cholecystectomy    . Partial hysterectomy      in her 65s  . Dental implants      History   Social History  . Marital Status: Married    Spouse Name: N/A    Number of Children: 2  . Years of Education: N/A   Occupational History  .  retired    Social History Main Topics  . Smoking status: Never Smoker   . Smokeless tobacco: Never Used  . Alcohol Use: No  . Drug Use: No  . Sexually Active: Not on file   Other Topics Concern  . Not on file   Social History Narrative    husband is a Company secretary                Current Outpatient Prescriptions on File Prior to Visit  Medication Sig Dispense Refill  . aspirin 81 MG tablet Take 81 mg by mouth daily.        Marland Kitchen atorvastatin (LIPITOR) 10 MG tablet Take 10 mg by mouth daily. BRAND NAME ONLY.      Marland Kitchen Azelastine-Fluticasone 137-50 MCG/ACT SUSP Place 1-2 sprays into the nose at bedtime.  3 Bottle  3  . beclomethasone (QVAR) 40 MCG/ACT inhaler Inhale 2 puffs into the lungs 2 (two) times daily. Rinse mouth  3  Inhaler  1  . Cyanocobalamin (B-12 PO) Take 1 tablet by mouth daily.       . fexofenadine (ALLEGRA) 180 MG tablet Take 180 mg by mouth daily as needed. For allergies      . furosemide (LASIX) 20 MG tablet TAKE 2 TABLETS BY MOUTH DAILY  180 tablet  1  . glucose blood test strip Use as instructed  100 each  12  . HYDROcodone-homatropine (HYCODAN) 5-1.5 MG/5ML syrup Take 5 mLs by mouth every 6 (six) hours as needed for cough.  120 mL  0  . Insulin Glargine (LANTUS SOLOSTAR [place]) Inject 20 Units into the skin at bedtime.      . Insulin Lispro, Human, (HUMALOG KWIKPEN [place]) Inject into the skin. 3 times a day (just before each meal) 07-01-13 units      . Insulin Pen Needle (PEN NEEDLES) 31G X 6 MM MISC Use as directed  400 each  3  . Multiple Vitamins-Minerals (CENTRUM SILVER PO) Take 1 tablet by mouth daily.        Marland Kitchen omeprazole (PRILOSEC) 20 MG capsule TAKE 1 CAPSULE DAILY  90  capsule  1   No current facility-administered medications on file prior to visit.    No Known Allergies  Family History  Problem Relation Age of Onset  . Heart attack Neg Hx   . Colon cancer Neg Hx   . Breast cancer Neg Hx     BP 130/76  Pulse 76  Wt 234 lb (106.142 kg)  BMI 40.15 kg/m2  SpO2 98%   Review of Systems denies hypoglycemia and weight change.      Objective:   Physical Exam VITAL SIGNS:  See vs page GENERAL: no distress   Lab Results  Component Value Date   HGBA1C 7.5* 07/10/2012      Assessment & Plan:  DM: This insulin regimen was chosen from multiple options, as it best matches her insulin to her changing requirements throughout the day.  The benefits of glycemic control must be weighed against the risks of hypoglycemia.  Based on the pattern of her cbg's, she needs some adjustment in her therapy

## 2012-08-15 NOTE — Patient Instructions (Addendum)
check your blood sugar 2 times a day.  vary the time of day when you check, between before the 3 meals, and at bedtime.  also check if you have symptoms of your blood sugar being too high or too low.  please keep a record of the readings and bring it to your next appointment here.  please call us sooner if your blood sugar goes below 70, or if you have a lot of readings over 200.   Please come back for a follow-up appointment in 2 months.   Please increase humalog to 3 times a day (just before each meal) 07-01-13 units.  If you eat at bedtime, take 3 units of humalog with it.   Please continue lantus, 20 units at bedtime.

## 2012-08-29 DIAGNOSIS — Z1211 Encounter for screening for malignant neoplasm of colon: Secondary | ICD-10-CM | POA: Diagnosis not present

## 2012-08-29 DIAGNOSIS — K219 Gastro-esophageal reflux disease without esophagitis: Secondary | ICD-10-CM | POA: Diagnosis not present

## 2012-10-09 ENCOUNTER — Other Ambulatory Visit: Payer: Self-pay | Admitting: Internal Medicine

## 2012-10-09 ENCOUNTER — Ambulatory Visit (INDEPENDENT_AMBULATORY_CARE_PROVIDER_SITE_OTHER): Payer: Medicare Other | Admitting: Endocrinology

## 2012-10-09 ENCOUNTER — Encounter: Payer: Self-pay | Admitting: Endocrinology

## 2012-10-09 VITALS — BP 118/76 | HR 76 | Ht 64.0 in | Wt 235.0 lb

## 2012-10-09 DIAGNOSIS — E119 Type 2 diabetes mellitus without complications: Secondary | ICD-10-CM | POA: Diagnosis not present

## 2012-10-09 DIAGNOSIS — Z1231 Encounter for screening mammogram for malignant neoplasm of breast: Secondary | ICD-10-CM

## 2012-10-09 NOTE — Patient Instructions (Addendum)
check your blood sugar 2 times a day.  vary the time of day when you check, between before the 3 meals, and at bedtime.  also check if you have symptoms of your blood sugar being too high or too low.  please keep a record of the readings and bring it to your next appointment here.  please call us sooner if your blood sugar goes below 70, or if you have a lot of readings over 200.   blood tests are being requested for you today.  We'll contact you with results. Please come back for a follow-up appointment in 3 months.

## 2012-10-09 NOTE — Progress Notes (Signed)
Subjective:    Patient ID: Angel Green, female    DOB: 1940-11-29, 72 y.o.   MRN: [id number]  HPI pt returns for f/u of insulin-requiring DM (dx'ed 1999, not in the context of pancreatitis; she has moderate neuropathy of the lower extremities and associated renal insufficiency; she has been on insulin since 2010; she has never had severe hypoglycemia or DKA).  no cbg record, but states cbg's vary from 65 (am) to (180).  However, she says most cbg's are in the low to mid-100's.  pt states she feels well in general. Past Medical History  Diagnosis Date  . Hypertension   . Diabetes mellitus, type 2   . Hyperlipidemia   . Osteoarthritis   . Ankle pain     chronic  . Rhinitis     allergic nos  . RENAL INSUFFICIENCY, CHRONIC 04/16/2010    Past Surgical History  Procedure Laterality Date  . Cholecystectomy    . Partial hysterectomy      in her 22s  . Dental implants      History   Social History  . Marital Status: Married    Spouse Name: N/A    Number of Children: 2  . Years of Education: N/A   Occupational History  .  retired    Social History Main Topics  . Smoking status: Never Smoker   . Smokeless tobacco: Never Used  . Alcohol Use: No  . Drug Use: No  . Sexual Activity: Not on file   Other Topics Concern  . Not on file   Social History Narrative    husband is a Company secretary                Current Outpatient Prescriptions on File Prior to Visit  Medication Sig Dispense Refill  . aspirin 81 MG tablet Take 81 mg by mouth daily.        Marland Kitchen atorvastatin (LIPITOR) 10 MG tablet Take 10 mg by mouth daily. BRAND NAME ONLY.      Marland Kitchen Azelastine-Fluticasone 137-50 MCG/ACT SUSP Place 1-2 sprays into the nose at bedtime.  3 Bottle  3  . beclomethasone (QVAR) 40 MCG/ACT inhaler Inhale 2 puffs into the lungs 2 (two) times daily. Rinse mouth  3 Inhaler  1  . Cyanocobalamin (B-12 PO) Take 1 tablet by mouth daily.       . fexofenadine (ALLEGRA) 180 MG tablet Take 180 mg by mouth  daily as needed. For allergies      . furosemide (LASIX) 20 MG tablet TAKE 2 TABLETS BY MOUTH DAILY  180 tablet  1  . glucose blood test strip Use as instructed  100 each  12  . HYDROcodone-homatropine (HYCODAN) 5-1.5 MG/5ML syrup Take 5 mLs by mouth every 6 (six) hours as needed for cough.  120 mL  0  . Insulin Glargine (LANTUS SOLOSTAR [place]) Inject 15 Units into the skin at bedtime.       . Insulin Lispro, Human, (HUMALOG KWIKPEN [place]) Inject into the skin. 3 times a day (just before each meal) 07-02-18 units      . Insulin Pen Needle (PEN NEEDLES) 31G X 6 MM MISC Use as directed  400 each  3  . Multiple Vitamins-Minerals (CENTRUM SILVER PO) Take 1 tablet by mouth daily.        Marland Kitchen omeprazole (PRILOSEC) 20 MG capsule TAKE 1 CAPSULE DAILY  90 capsule  1   No current facility-administered medications on file prior to visit.    No Known Allergies  Family History  Problem Relation Age of Onset  . Heart attack Neg Hx   . Colon cancer Neg Hx   . Breast cancer Neg Hx    BP 118/76  Pulse 76  Ht 5\' 4"  (1.626 m)  Wt 235 lb (106.595 kg)  BMI 40.32 kg/m2  SpO2 98%  Review of Systems Denies LOC and weight change    Objective:   Physical Exam VITAL SIGNS:  See vs page GENERAL: no distress NECK: There is no palpable thyroid enlargement.  No thyroid nodule is palpable.  No palpable lymphadenopathy at the anterior neck.  Lab Results  Component Value Date   HGBA1C 7.3* 10/09/2012      Assessment & Plan:  DM: The pattern of his cbg's indicates he needs some adjustment in his therapy.  This insulin regimen was chosen from multiple options, as it best matches his insulin to his changing requirements throughout the day.  The benefits of glycemic control must be weighed against the risks of hypoglycemia.   Renal insuff: this increases the risk of hypoglycemia.

## 2012-10-27 DIAGNOSIS — E119 Type 2 diabetes mellitus without complications: Secondary | ICD-10-CM | POA: Diagnosis not present

## 2012-10-27 DIAGNOSIS — N2581 Secondary hyperparathyroidism of renal origin: Secondary | ICD-10-CM | POA: Diagnosis not present

## 2012-10-27 DIAGNOSIS — I10 Essential (primary) hypertension: Secondary | ICD-10-CM | POA: Diagnosis not present

## 2012-10-27 DIAGNOSIS — N183 Chronic kidney disease, stage 3 unspecified: Secondary | ICD-10-CM | POA: Diagnosis not present

## 2012-10-27 DIAGNOSIS — D509 Iron deficiency anemia, unspecified: Secondary | ICD-10-CM | POA: Diagnosis not present

## 2012-10-27 DIAGNOSIS — E7800 Pure hypercholesterolemia, unspecified: Secondary | ICD-10-CM | POA: Diagnosis not present

## 2012-11-03 DIAGNOSIS — Z1211 Encounter for screening for malignant neoplasm of colon: Secondary | ICD-10-CM | POA: Diagnosis not present

## 2012-11-03 DIAGNOSIS — D126 Benign neoplasm of colon, unspecified: Secondary | ICD-10-CM | POA: Diagnosis not present

## 2012-11-03 DIAGNOSIS — K6389 Other specified diseases of intestine: Secondary | ICD-10-CM | POA: Diagnosis not present

## 2012-11-22 ENCOUNTER — Ambulatory Visit (HOSPITAL_COMMUNITY)
Admission: RE | Admit: 2012-11-22 | Discharge: 2012-11-22 | Disposition: A | Discharge status: Home / Self Care | Payer: Medicare Other | Source: Ambulatory Visit | Attending: Internal Medicine | Admitting: Internal Medicine

## 2012-11-22 DIAGNOSIS — Z1231 Encounter for screening mammogram for malignant neoplasm of breast: Secondary | ICD-10-CM | POA: Insufficient documentation

## 2012-11-29 ENCOUNTER — Encounter: Payer: Self-pay | Admitting: Internal Medicine

## 2012-11-29 ENCOUNTER — Ambulatory Visit (INDEPENDENT_AMBULATORY_CARE_PROVIDER_SITE_OTHER): Payer: Medicare Other | Admitting: Internal Medicine

## 2012-11-29 VITALS — BP 160/90 | HR 81 | Ht 64.0 in | Wt 234.4 lb

## 2012-11-29 DIAGNOSIS — J309 Allergic rhinitis, unspecified: Secondary | ICD-10-CM | POA: Diagnosis not present

## 2012-11-29 DIAGNOSIS — Z23 Encounter for immunization: Secondary | ICD-10-CM

## 2012-11-29 DIAGNOSIS — J302 Other seasonal allergic rhinitis: Secondary | ICD-10-CM

## 2012-11-29 NOTE — Progress Notes (Signed)
Patient ID: Angel Green, female    DOB: 1941/02/06, 72 y.o.   MRN: [id number]  HPI 08/12/10 -32 yoF never smoker, seen at kind request of Dr Larose Kells. Complains that for 3 years she has had excessive phlegm/ congestion in chest and throat. this is present most of the time. Nasal sprays reduce the drainage. Feels that this condition makes her weak and washed out. Symptoms worse in Spring and with weather changes. Seemed better while she was being treated for a cold. Denies dysphagia or reflux but has been told she has GERD. Nasal surgery-? Septoplasty?in past. Allergy skin tested twice with little positive response. Denies asthma, skin rash or relation to meals. Astelin and Flonase used intermittently.  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 Retired from [employer] work. CXR- neg, NAD 06/30/10  09/17/10- 08/12/10 -49 yoF never smoker,  Followed for allergic rhinitis, bronchitis. Complains that for 3 years she has had excessive phlegm/ congestion in chest and throat. Last visit we gave Qvar- big help; allegra- not much help by itself. 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 Lemonade drink mix makes phlegm come.   12/18/10--70 yoF never smoker,  Followed for allergic rhinitis, bronchitis. Complains that for 3 years she has had excessive phlegm/ congestion in chest and throat. Had flu vaccine. No acute problems since last here and feels well. Minor sniffling just in the last day but no cough, chest pain or significant sinus discomfort. She continues Qvar 40.  10/15/11- -70 yoF never smoker,  Followed for allergic rhinitis, bronchitis.  New problem-urticaria/angioedema. Had a viral URI syndrome then woke August 2 with facial and tongue swelling, hard to talk. Involvement especially in left cheek and left side of tongue. ER visit questioned allergy and treated with Benadryl 50 mg plus Pepcid 40 mg which continue, and therefore day course of prednisone. Her URI symptoms have been treated with Mucinex  and cough syrup. Allergy Profile 08/13/2010-total IgE 21.4, negative for specific elevations.  11/30/11- 4 yoF never smoker,  Followed for allergic rhinitis, bronchitis, urticaria/angioedemathese No swelling in tongue or face since last visit Still postnasal drip which she treats with Astelin. Dry cough when she lies down.  05/30/12- 72 yoF never smoker,  Followed for allergic rhinitis, bronchitis. Follows For:  Denies excess swelling at this time - Seasonal allergy complaints - runny nose , itchy eyes, sneezing Likes Dymista nasal spray. Has had no more episodes of angioedema or hives. Off Allegra in the last few days has had increased itching and watery nose. Coughs while lying down. Does not feel reflux while taking Prilosec. CXR-06/16/11  IMPRESSION:  No evidence of acute cardiopulmonary disease.  Original Report Authenticated By: Julian Hy, M.D.  11/29/12- 54 yoF never smoker,  Followed for allergic rhinitis, bronchitis complicated by renal insuff, DM2, HBP FOLLOWS FOR:  Runny nose, itchy and watery eyes- due to season change x 2 days. Likes Dymista- just restarted.  Review of Systems-HPI Constitutional:   No-   weight loss, night sweats, fevers, chills, fatigue, lassitude. HEENT:   No-  headaches, difficulty swallowing, tooth/dental problems, sore throat,       + sneezing, itching, ear ache, +nasal congestion, post nasal drip,  CV:  No-   chest pain, orthopnea, PND, swelling in lower extremities, anasarca, dizziness, palpitations Resp: No-   shortness of breath with exertion or at rest.             No-  productive cough,  + non-productive cough,  No- coughing up of  blood.              No-   change in color of mucus.  No- wheezing.   Skin: No-   rash or lesions. GI:  No-   heartburn, indigestion, abdominal pain, nausea, vomiting,  GU:  MS:  No-   joint pain or swelling.   Neuro-     nothing unusual Psych:  No- change in mood or affect. No depression or anxiety.  No memory  loss.   Objective:   Physical Exam  General- Alert, Oriented, Affect-appropriate, Distress- none acute    obese Skin- rash-none, lesions- none, excoriation- none Lymphadenopathy- none Head- atraumatic. No facial asymmetry. Left parotid area seems normal.            Eyes- Gross vision intact, PERRLA, conjunctivae clear secretions            Ears- Hearing, canals- normal            Nose- Clear, No-Septal dev, mucus, polyps, erosion, perforation             Throat- Mallampati II , +posterior pharynx is red and glandular , drainage- none, tonsils- atrophic Neck- flexible , trachea midline, no stridor , thyroid nl, carotid no bruit Chest - symmetrical excursion , unlabored           Heart/CV- RRR , no murmur , no gallop  , no rub, nl s1 s2                           - JVD- none , edema- none, stasis changes- none, varices- none           Lung- clear to P&A, wheeze- none, cough- none , dullness-none, rub- none           Chest wall-  Abd-  Br/ Gen/ Rectal- Not done, not indicated Extrem- cyanosis- none, clubbing, none, atrophy- none, strength- nl Neuro- grossly intact to observation

## 2012-11-29 NOTE — Patient Instructions (Signed)
If you still need help despite taking Dymista nasal spray every day, then you can add Allegra antihistamine  High dose flu vax

## 2012-12-01 DIAGNOSIS — N183 Chronic kidney disease, stage 3 unspecified: Secondary | ICD-10-CM | POA: Diagnosis not present

## 2012-12-12 ENCOUNTER — Telehealth: Payer: Self-pay

## 2012-12-12 NOTE — Telephone Encounter (Signed)
Medication and allergies: reviewed and updated  90 day supply/mail order:  [facility] (maintenance)  Local pharmacy: [facility]   Immunizations due: all are UTD   A/P:   No changes to FH, SH DM followed by Dr Loanne Drilling; last OV 09/2012; eye exam 01/2012  To Discuss with Provider: Blood pressure medicaitons Dr Annamaria Boots Rx'd 5mg  Altace for BP Possibly bring current labs she had (provider not on [facility])

## 2012-12-14 ENCOUNTER — Encounter: Payer: Self-pay | Admitting: Internal Medicine

## 2012-12-14 NOTE — Assessment & Plan Note (Signed)
May add allegra as needed Discussed flu vax

## 2012-12-15 ENCOUNTER — Ambulatory Visit (INDEPENDENT_AMBULATORY_CARE_PROVIDER_SITE_OTHER): Payer: Medicare Other | Admitting: Internal Medicine

## 2012-12-15 ENCOUNTER — Encounter: Payer: Self-pay | Admitting: Internal Medicine

## 2012-12-15 VITALS — BP 152/85 | HR 88 | Temp 98.1°F | Ht 65.2 in | Wt 234.0 lb

## 2012-12-15 DIAGNOSIS — E785 Hyperlipidemia, unspecified: Secondary | ICD-10-CM

## 2012-12-15 DIAGNOSIS — M159 Polyosteoarthritis, unspecified: Secondary | ICD-10-CM

## 2012-12-15 DIAGNOSIS — I129 Hypertensive chronic kidney disease with stage 1 through stage 4 chronic kidney disease, or unspecified chronic kidney disease: Secondary | ICD-10-CM

## 2012-12-15 DIAGNOSIS — M1990 Unspecified osteoarthritis, unspecified site: Secondary | ICD-10-CM

## 2012-12-15 DIAGNOSIS — N189 Chronic kidney disease, unspecified: Secondary | ICD-10-CM | POA: Diagnosis not present

## 2012-12-15 DIAGNOSIS — Z0000 Encounter for general adult medical examination without abnormal findings: Secondary | ICD-10-CM

## 2012-12-15 DIAGNOSIS — I10 Essential (primary) hypertension: Secondary | ICD-10-CM

## 2012-12-15 LAB — LIPID PANEL
HDL: 57.7 mg/dL (ref >39.00)
LDL Cholesterol: 63 mg/dL (ref 0–99)
Total CHOL/HDL Ratio: 2
VLDL: 13.8 mg/dL (ref 0.0–40.0)

## 2012-12-15 LAB — BASIC METABOLIC PANEL
CO2: 30 mEq/L (ref 19–32)
Glucose, Bld: 104 mg/dL — ABNORMAL HIGH (ref 70–99)
Potassium: 4.3 mEq/L (ref 3.5–5.1)
Sodium: 139 mEq/L (ref 135–145)

## 2012-12-15 MED ORDER — AMLODIPINE BESYLATE 5 MG PO TABS: 5.0000 mg | ORAL_TABLET | Freq: Every day | ORAL | Status: DC

## 2012-12-15 NOTE — Progress Notes (Signed)
Subjective:    Patient ID: Angel Green, female    DOB: 11-Jun-1940, 72 y.o.   MRN: [id number]  HPI Here for Medicare AWV:  1. Risk factors based on Past M, S, F history: reviewed 2. Physical Activities:  Active, walks at the [facility] sometimes    3. Depression/mood:  Neg screen 4. Hearing:  No depression or anxiety 5. ADL's:  Independent   6. Fall Risk: Prevention discussed, fell from a treadmill, no major injuries  7. home Safety: does feelsafe at home   8. Height, weight, &visual acuity: see VS, use glasses, sees the eye doctor q year 9. Counseling: provided 10. Labs ordered based on risk factors: if needed   11. Referral Coordination: if needed 12.  Care Plan, see assessment and plan   13.   Cognitive Assessment: Motor skills appropriate for age, constipation normal.  In addition, today we discussed the following: HTN-- checks BPs range 120 to 160  CRI-- saw nephrology, altace added, reports had a post ACEi BMP done, BP still slt elevated  DM-- per Dr Loanne Drilling, CBGs 90 to 130  Data reviewed: Note from nephrology 10-2012 --->  CBC was normal LFTs normal, vitamin D within normal. Creatinine was 1.39.   Past Medical History  Diagnosis Date  . Hypertension   . Diabetes mellitus, type 2   . Hyperlipidemia   . Osteoarthritis   . Ankle pain     chronic  . Rhinitis     allergic nos  . RENAL INSUFFICIENCY, CHRONIC 04/16/2010   Past Surgical History  Procedure Laterality Date  . Cholecystectomy    . Partial hysterectomy      in her 6s  . Dental implants     History   Social History  . Marital Status: Married    Spouse Name: N/A    Number of Children: 2  . Years of Education: N/A   Occupational History  .  retired    Social History Main Topics  . Smoking status: Never Smoker   . Smokeless tobacco: Never Used  . Alcohol Use: No  . Drug Use: No  . Sexual Activity: Not on file   Other Topics Concern  . Not on file   Social History Narrative    husband is a  Company secretary               Family History  Problem Relation Age of Onset  . Heart attack Neg Hx   . Colon cancer Neg Hx   . Breast cancer Neg Hx    Review of Systems No  CP, SOB, lower extremity edema Denies  nausea, vomiting diarrhea Denies  blood in the stools (-) cough, sputum production, (-) wheezing  No dysuria, gross hematuria, difficulty urinating        Objective:   Physical Exam BP 152/85  Pulse 88  Temp(Src) 98.1 F (36.7 C)  Ht 5' 5.2" (1.656 m)  Wt 234 lb (106.142 kg)  BMI 38.7 kg/m2  SpO2 100% General -- alert, well-developed, NAD.  Neck --no thyromegaly Breast-- no dominant mass, skin and nipples normal to inspection on palpation, axillary areas without mass or lymphadenopathy Lungs -- normal respiratory effort, no intercostal retractions, no accessory muscle use, and normal breath sounds.  Heart-- normal rate, regular rhythm, no murmur.  Abdomen-- Not distended, good bowel sounds,soft, non-tender. Extremities-- no pretibial edema bilaterally  Neurologic--  alert & oriented X3. Speech normal, gait normal, strength normal in all extremities.  Psych-- Cognition and judgment appear intact. Cooperative  with normal attention span and concentration. No anxious appearing , no depressed appearing.      Assessment & Plan:

## 2012-12-15 NOTE — Assessment & Plan Note (Addendum)
Td--9-09 pneumonia shot-- 9-09 Had a flu shot  got a shingles shot already   last mammogram 09-2012 (-), negative breast exam today PAPs, see previous entry, no further screening Cscope 02-2002 and 10-2012, bx tubular adenoma, next per  Dr Collene Mares  07-2009 DEXA-- normal, recheck ~ 2014-2015    Diet-exercise discussed

## 2012-12-15 NOTE — Patient Instructions (Signed)
Get your blood work before you leave  Next visit in 3 months hypertension   follow up (30 minutes) . No Fasting Please make an appointment    Start  amlodipine 5 mg, when you get your supply This medication should improve your BP.  Check the  blood pressure 2 or 3 times a  week be sure it is between 110/60 and 140/85. Ideal blood pressure is 120/80. If it is consistently higher or lower, let me know   Fall Prevention and Home Safety Falls cause injuries and can affect all age groups. It is possible to use preventive measures to significantly decrease the likelihood of falls. There are many simple measures which can make your home safer and prevent falls. OUTDOORS  Repair cracks and edges of walkways and driveways.  Remove high doorway thresholds.  Trim shrubbery on the main path into your home.  Have good outside lighting.  Clear walkways of tools, rocks, debris, and clutter.  Check that handrails are not broken and are securely fastened. Both sides of steps should have handrails.  Have leaves, snow, and ice cleared regularly.  Use sand or salt on walkways during winter months.  In the garage, clean up grease or oil spills. BATHROOM  Install night lights.  Install grab bars by the toilet and in the tub and shower.  Use non-skid mats or decals in the tub or shower.  Place a plastic non-slip stool in the shower to sit on, if needed.  Keep floors dry and clean up all water on the floor immediately.  Remove soap buildup in the tub or shower on a regular basis.  Secure bath mats with non-slip, double-sided rug tape.  Remove throw rugs and tripping hazards from the floors. BEDROOMS  Install night lights.  Make sure a bedside light is easy to reach.  Do not use oversized bedding.  Keep a telephone by your bedside.  Have a firm chair with side arms to use for getting dressed.  Remove throw rugs and tripping hazards from the floor. KITCHEN  Keep handles on pots  and pans turned toward the center of the stove. Use back burners when possible.  Clean up spills quickly and allow time for drying.  Avoid walking on wet floors.  Avoid hot utensils and knives.  Position shelves so they are not too high or low.  Place commonly used objects within easy reach.  If necessary, use a sturdy step stool with a grab bar when reaching.  Keep electrical cables out of the way.  Do not use floor polish or wax that makes floors slippery. If you must use wax, use non-skid floor wax.  Remove throw rugs and tripping hazards from the floor. STAIRWAYS  Never leave objects on stairs.  Place handrails on both sides of stairways and use them. Fix any loose handrails. Make sure handrails on both sides of the stairways are as long as the stairs.  Check carpeting to make sure it is firmly attached along stairs. Make repairs to worn or loose carpet promptly.  Avoid placing throw rugs at the top or bottom of stairways, or properly secure the rug with carpet tape to prevent slippage. Get rid of throw rugs, if possible.  Have an electrician put in a light switch at the top and bottom of the stairs. OTHER FALL PREVENTION TIPS  Wear low-heel or rubber-soled shoes that are supportive and fit well. Wear closed toe shoes.  When using a stepladder, make sure it is fully opened  and both spreaders are firmly locked. Do not climb a closed stepladder.  Add color or contrast paint or tape to grab bars and handrails in your home. Place contrasting color strips on first and last steps.  Learn and use mobility aids as needed. Install an electrical emergency response system.  Turn on lights to avoid dark areas. Replace light bulbs that burn out immediately. Get light switches that glow.  Arrange furniture to create clear pathways. Keep furniture in the same place.  Firmly attach carpet with non-skid or double-sided tape.  Eliminate uneven floor surfaces.  Select a carpet  pattern that does not visually hide the edge of steps.  Be aware of all pets. OTHER HOME SAFETY TIPS  Set the water temperature for 120 F (48.8 C).  Keep emergency numbers on or near the telephone.  Keep smoke detectors on every level of the home and near sleeping areas. Document Released: 01/29/2002 Document Revised: 08/10/2011 Document Reviewed: 04/30/2011 [facility] Patient Information 2014 [place].

## 2012-12-15 NOTE — Assessment & Plan Note (Addendum)
started ACE inhibitors per nephrology, a BMP was drawn afterwards but I don't see the results. BP range from 120, 160. Plan:  Add amlodipine BMP Notify renal

## 2012-12-15 NOTE — Assessment & Plan Note (Signed)
Currently not an issue 

## 2012-12-15 NOTE — Assessment & Plan Note (Signed)
Labs

## 2012-12-15 NOTE — Assessment & Plan Note (Addendum)
Saw nephrology 10-2012, creatinine was 1.39, was  started on ACEi. See comments under hypertension

## 2012-12-17 ENCOUNTER — Other Ambulatory Visit: Payer: Self-pay | Admitting: Internal Medicine

## 2012-12-18 ENCOUNTER — Encounter (HOSPITAL_COMMUNITY): Payer: Self-pay | Admitting: Emergency Medicine

## 2012-12-18 ENCOUNTER — Emergency Department (HOSPITAL_COMMUNITY)
Admission: EM | Admit: 2012-12-18 | Discharge: 2012-12-18 | Disposition: A | Discharge status: Home / Self Care | Payer: Medicare Other | Attending: Emergency Medicine | Admitting: Emergency Medicine

## 2012-12-18 DIAGNOSIS — E785 Hyperlipidemia, unspecified: Secondary | ICD-10-CM | POA: Insufficient documentation

## 2012-12-18 DIAGNOSIS — Z7982 Long term (current) use of aspirin: Secondary | ICD-10-CM | POA: Insufficient documentation

## 2012-12-18 DIAGNOSIS — E119 Type 2 diabetes mellitus without complications: Secondary | ICD-10-CM | POA: Diagnosis not present

## 2012-12-18 DIAGNOSIS — N390 Urinary tract infection, site not specified: Secondary | ICD-10-CM | POA: Insufficient documentation

## 2012-12-18 DIAGNOSIS — M1990 Unspecified osteoarthritis, unspecified site: Secondary | ICD-10-CM | POA: Diagnosis not present

## 2012-12-18 DIAGNOSIS — N183 Chronic kidney disease, stage 3 unspecified: Secondary | ICD-10-CM | POA: Diagnosis not present

## 2012-12-18 DIAGNOSIS — I129 Hypertensive chronic kidney disease with stage 1 through stage 4 chronic kidney disease, or unspecified chronic kidney disease: Secondary | ICD-10-CM | POA: Diagnosis not present

## 2012-12-18 DIAGNOSIS — Z79899 Other long term (current) drug therapy: Secondary | ICD-10-CM | POA: Diagnosis not present

## 2012-12-18 DIAGNOSIS — Z794 Long term (current) use of insulin: Secondary | ICD-10-CM | POA: Insufficient documentation

## 2012-12-18 LAB — CBC WITH DIFFERENTIAL/PLATELET
Basophils Relative: 0 % (ref 0–1)
Eosinophils Absolute: 0.1 10*3/uL (ref 0.0–0.7)
Eosinophils Relative: 2 % (ref 0–5)
HCT: 39.6 % (ref 36.0–46.0)
Hemoglobin: 13.6 g/dL (ref 12.0–15.0)
Lymphocytes Relative: 35 % (ref 12–46)
Lymphs Abs: 2.5 10*3/uL (ref 0.7–4.0)
MCH: 31.9 pg (ref 26.0–34.0)
MCHC: 34.3 g/dL (ref 30.0–36.0)
MCV: 93 fL (ref 78.0–100.0)
Monocytes Relative: 8 % (ref 3–12)
Platelets: 225 10*3/uL (ref 150–400)
RBC: 4.26 MIL/uL (ref 3.87–5.11)
WBC: 7.1 10*3/uL (ref 4.0–10.5)

## 2012-12-18 LAB — URINALYSIS, ROUTINE W REFLEX MICROSCOPIC
Bilirubin Urine: NEGATIVE
Glucose, UA: NEGATIVE mg/dL
Specific Gravity, Urine: 1.007 (ref 1.005–1.030)
Urobilinogen, UA: 0.2 mg/dL (ref 0.0–1.0)
pH: 6.5 (ref 5.0–8.0)

## 2012-12-18 LAB — URINE MICROSCOPIC-ADD ON

## 2012-12-18 LAB — BASIC METABOLIC PANEL
BUN: 14 mg/dL (ref 6–23)
CO2: 27 mEq/L (ref 19–32)
Calcium: 8.9 mg/dL (ref 8.4–10.5)
GFR calc Af Amer: 44 mL/min — ABNORMAL LOW (ref >90)
GFR calc non Af Amer: 38 mL/min — ABNORMAL LOW (ref >90)
Glucose, Bld: 164 mg/dL — ABNORMAL HIGH (ref 70–99)

## 2012-12-18 LAB — GLUCOSE, CAPILLARY

## 2012-12-18 MED ORDER — LIDOCAINE HCL (PF) 1 % IJ SOLN
INTRAMUSCULAR | Status: AC
  Administered 2012-12-18: 2 mL
  Filled 2012-12-18: qty 5

## 2012-12-18 MED ORDER — CEFTRIAXONE SODIUM 1 G IJ SOLR: 1.0000 g | Freq: Once | INTRAMUSCULAR | Status: DC

## 2012-12-18 MED ORDER — CEFTRIAXONE SODIUM 1 G IJ SOLR
1.0000 g | Freq: Once | INTRAMUSCULAR | Status: AC
  Administered 2012-12-18: 1 g via INTRAMUSCULAR
  Filled 2012-12-18: qty 10

## 2012-12-18 MED ORDER — SULFAMETHOXAZOLE-TRIMETHOPRIM 800-160 MG PO TABS
1.0000 | ORAL_TABLET | Freq: Two times a day (BID) | ORAL | Status: DC
Start: 2012-12-18 — End: 2013-01-24

## 2012-12-18 NOTE — ED Notes (Signed)
Pt presented to ED with blood in urine.Pt denies any pain or frequency.

## 2012-12-18 NOTE — ED Notes (Signed)
Pt's CBG is 139 mg/dl.RN notified.

## 2012-12-18 NOTE — Telephone Encounter (Signed)
rx refilled per protocol. DJR  

## 2012-12-18 NOTE — ED Provider Notes (Signed)
CSN: [id number]     Arrival date & time 12/18/12  1115 History   First MD Initiated Contact with Patient 12/18/12 1119     Chief Complaint  Patient presents with  . Hematuria   (Consider location/radiation/quality/duration/timing/severity/associated sxs/prior Treatment) HPI  Angel Green Is a 72 year old female past medical history of stage III chronic kidney disease, hyperlipidemia, diabetes, hypertension he presents to the emergency department chief complaint of hematuria.  The patient states she has a history of previous urinary tract infection which presented the same way.  Patient states morning she will use the restroom and noticed pinkish blood in her urine.  She denies any dysuria, flank pain, frequency.  Patient states her previous uti presented the same way.  She denies any nausea, vomiting, chills, fever.  She denies a history of smoking.  Patient denies any Abdominal pain, vaginal symptoms, changes in stools or blood in stools.  Past Medical History  Diagnosis Date  . Hypertension   . Diabetes mellitus, type 2   . Hyperlipidemia   . Osteoarthritis   . Ankle pain     chronic  . Rhinitis     allergic nos  . RENAL INSUFFICIENCY, CHRONIC 04/16/2010   Past Surgical History  Procedure Laterality Date  . Cholecystectomy    . Partial hysterectomy      in her 85s  . Dental implants     Family History  Problem Relation Age of Onset  . Heart attack Neg Hx   . Colon cancer Neg Hx   . Breast cancer Neg Hx    History  Substance Use Topics  . Smoking status: Never Smoker   . Smokeless tobacco: Never Used  . Alcohol Use: No   OB History   Grav Para Term Preterm Abortions TAB SAB Ect Mult Living                 Review of Systems  Constitutional: Negative for fever and chills.  HENT: Negative for trouble swallowing.   Respiratory: Negative for shortness of breath.   Cardiovascular: Negative for chest pain.  Gastrointestinal: Negative for nausea, vomiting, abdominal  pain, diarrhea and constipation.  Genitourinary: Positive for hematuria. Negative for dysuria, vaginal bleeding and vaginal discharge.  Musculoskeletal: Negative for arthralgias and myalgias.  Skin: Negative for rash.  Neurological: Negative for numbness.  All other systems reviewed and are negative.    Allergies  Review of patient's allergies indicates no known allergies.  Home Medications   Current Outpatient Rx  Name  Route  Sig  Dispense  Refill  . amLODipine (NORVASC) 5 MG tablet   Oral   Take 1 tablet (5 mg total) by mouth daily.   90 tablet   3   . aspirin 81 MG tablet   Oral   Take 81 mg by mouth daily.           Marland Kitchen atorvastatin (LIPITOR) 10 MG tablet   Oral   Take 10 mg by mouth daily. BRAND NAME ONLY.         Marland Kitchen Azelastine-Fluticasone 137-50 MCG/ACT SUSP   Nasal   Place 1-2 sprays into the nose at bedtime.   3 Bottle   3   . beclomethasone (QVAR) 40 MCG/ACT inhaler   Inhalation   Inhale 2 puffs into the lungs 2 (two) times daily. Rinse mouth   3 Inhaler   1   . Cyanocobalamin (B-12 PO)   Oral   Take 1 tablet by mouth daily.          Marland Kitchen  furosemide (LASIX) 20 MG tablet   Oral   Take 40 mg by mouth daily.         Marland Kitchen glucose blood test strip      Use as instructed   100 each   12     Check twice daily as directed.  Dx: 250.00  Cle ...   . Insulin Glargine (LANTUS SOLOSTAR [place])   Subcutaneous   Inject 15 Units into the skin at bedtime.          . Insulin Lispro, Human, (HUMALOG KWIKPEN [place])   Subcutaneous   Inject into the skin. 3 times a day (just before each meal) 07-02-18 units         . Insulin Pen Needle (PEN NEEDLES) 31G X 6 MM MISC      Use as directed   400 each   3     Patient Requires Extra Quantity Dispensed for [2]  ...   . Multiple Vitamins-Minerals (CENTRUM SILVER PO)   Oral   Take 1 tablet by mouth daily.           Marland Kitchen omeprazole (PRILOSEC) 20 MG capsule   Oral   Take 20 mg by mouth daily.         .  ramipril (ALTACE) 5 MG capsule   Oral   Take 5 mg by mouth daily.          BP 144/88  Pulse 84  Temp(Src) 98.3 F (36.8 C) (Oral)  Resp 18  SpO2 98% Physical Exam  Constitutional: She is oriented to person, place, and time. She appears well-developed and well-nourished. No distress.  HENT:  Head: Normocephalic and atraumatic.  Eyes: Conjunctivae are normal. No scleral icterus.  Neck: Normal range of motion.  Cardiovascular: Normal rate, regular rhythm and normal heart sounds.  Exam reveals no gallop and no friction rub.   No murmur heard. Pulmonary/Chest: Effort normal and breath sounds normal. No respiratory distress.  No CVA tenderness, no suprapubic tenderness  Abdominal: Soft. Bowel sounds are normal. She exhibits no distension and no mass. There is no tenderness. There is no guarding.  Neurological: She is alert and oriented to person, place, and time.  Skin: Skin is warm and dry. She is not diaphoretic.  Psychiatric: Her behavior is normal.    ED Course  Procedures (including critical care time) Labs Review Labs Reviewed  GLUCOSE, CAPILLARY - Abnormal; Notable for the following:    Glucose-Capillary 139 (*)    All other components within normal limits  URINALYSIS, ROUTINE W REFLEX MICROSCOPIC  CBC WITH DIFFERENTIAL  BASIC METABOLIC PANEL   Imaging Review No results found.  EKG Interpretation   None       MDM   1. UTI (lower urinary tract infection)    Patient here with complaint of hematuria.  Her labs currently pending.  Is no pain at this time the   1:29 PM Filed Vitals:   12/18/12 1128  BP: 144/88  Pulse: 84  Temp: 98.3 F (36.8 C)  TempSrc: Oral  Resp: 18  SpO2: 98%    Patient analysis consistent with recurrent infection.  2 slight hyperglycemia, renal insufficiency with elevated creatinine however appears at baseline.  Patient will be given Rocephin IM 1 g here.  Of discharge the patient with Bactrim.  Patient is to followup with her  primary care physician.  She appears appropriate for discharge at this time.   Margarita Mail, PA-C 12/20/12 [phone]

## 2012-12-18 NOTE — ED Notes (Signed)
Pt discharged.Vital signs stable and GCS 15.Discharge instruction given.

## 2012-12-20 LAB — URINE CULTURE: Colony Count: 30000

## 2012-12-20 NOTE — ED Provider Notes (Signed)
Medical screening examination/treatment/procedure(s) were performed by non-physician practitioner and as supervising physician I was immediately available for consultation/collaboration.  EKG Interpretation   None         Ephraim Hamburger, MD 12/20/12 1114

## 2012-12-21 ENCOUNTER — Telehealth (HOSPITAL_COMMUNITY): Payer: Self-pay | Admitting: *Deleted

## 2012-12-21 NOTE — Progress Notes (Signed)
ED Antimicrobial Stewardship Positive Culture Follow Up   Angel Green is an 72 y.o. female who presented to [facility] on 12/18/2012 with a chief complaint of  Chief Complaint  Patient presents with  . Hematuria    Recent Results (from the past 720 hour(s))  URINE CULTURE     Status: None   Collection Time    12/18/12 12:14 PM      Result Value Range Status   Specimen Description URINE, CLEAN CATCH   Final   Special Requests NONE   Final   Culture  Setup Time     Final   Value: 12/18/2012 18:10     Performed at [facility]     Final   Value: 30,000 COLONIES/ML     Performed at [facility]   Culture     Final   Value: PROTEUS MIRABILIS     Performed at [facility]   Report Status 12/20/2012 FINAL   Final   Organism ID, Bacteria PROTEUS MIRABILIS   Final    [x]  Treated with Bactrim, organism resistant to prescribed antimicrobial []  Patient discharged originally without antimicrobial agent and treatment is now indicated  New antibiotic prescription: Stop bactrim.  Amoxicillin 250mg  PO TID x 7 days  ED Provider: Iran Sizer, PA-C   [name] 12/21/2012, 9:55 AM Infectious Diseases Pharmacist Phone# [phone]

## 2012-12-21 NOTE — ED Notes (Signed)
Post ED Visit - Positive Culture Follow-up: Successful Patient Follow-Up  Culture assessed and recommendations reviewed by: []  Wes Whittier, Pharm.D., BCPS [x]  Heide Guile, Pharm.D., BCPS []  Alycia Rossetti, Pharm.D., BCPS []  [place], Pharm.D., BCPS, AAHIVP []  Legrand Como, Pharm.D., BCPS, AAHIVP  Positive Urine culture  []  Patient discharged without antimicrobial prescription and treatment is now indicated [x]  Organism is resistant to prescribed ED discharge antimicrobial []  Patient with positive blood cultures  Changes discussed with ED provider: Iran Sizer New antibiotic prescription Amoxicillin 250 mg TID x 7 days   Varney Baas 12/21/2012, 2:25 PM

## 2013-01-11 ENCOUNTER — Ambulatory Visit: Payer: Medicare Other | Admitting: Endocrinology

## 2013-01-12 ENCOUNTER — Ambulatory Visit (INDEPENDENT_AMBULATORY_CARE_PROVIDER_SITE_OTHER): Payer: Medicare Other | Admitting: Endocrinology

## 2013-01-12 ENCOUNTER — Encounter: Payer: Self-pay | Admitting: Endocrinology

## 2013-01-12 VITALS — BP 148/72 | HR 60 | Temp 97.9°F | Resp 16 | Ht 65.0 in | Wt 236.0 lb

## 2013-01-12 DIAGNOSIS — E1142 Type 2 diabetes mellitus with diabetic polyneuropathy: Secondary | ICD-10-CM | POA: Diagnosis not present

## 2013-01-12 DIAGNOSIS — E1149 Type 2 diabetes mellitus with other diabetic neurological complication: Secondary | ICD-10-CM | POA: Diagnosis not present

## 2013-01-12 DIAGNOSIS — E1140 Type 2 diabetes mellitus with diabetic neuropathy, unspecified: Secondary | ICD-10-CM

## 2013-01-12 NOTE — Progress Notes (Signed)
Subjective:    Patient ID: Angel Green, female    DOB: 08/30/1940, 72 y.o.   MRN: [id number]  HPI pt returns for f/u of insulin-requiring DM (dx'ed 1999, on a routine blood test; she has moderate neuropathy of the lower extremities and associated renal insufficiency; she has been on insulin since 2010; she has never had severe hypoglycemia or DKA).  she brings a record of her cbg's which i have reviewed today.  Almost all are checked in am, and are 80-100.   Past Medical History  Diagnosis Date  . Hypertension   . Diabetes mellitus, type 2   . Hyperlipidemia   . Osteoarthritis   . Ankle pain     chronic  . Rhinitis     allergic nos  . RENAL INSUFFICIENCY, CHRONIC 04/16/2010    Past Surgical History  Procedure Laterality Date  . Cholecystectomy    . Partial hysterectomy      in her 11s  . Dental implants      History   Social History  . Marital Status: Married    Spouse Name: N/A    Number of Children: 2  . Years of Education: N/A   Occupational History  .  retired    Social History Main Topics  . Smoking status: Never Smoker   . Smokeless tobacco: Never Used  . Alcohol Use: No  . Drug Use: No  . Sexual Activity: Not on file   Other Topics Concern  . Not on file   Social History Narrative    husband is a Company secretary                Current Outpatient Prescriptions on File Prior to Visit  Medication Sig Dispense Refill  . amLODipine (NORVASC) 5 MG tablet Take 1 tablet (5 mg total) by mouth daily.  90 tablet  3  . aspirin 81 MG tablet Take 81 mg by mouth daily.        Marland Kitchen atorvastatin (LIPITOR) 10 MG tablet Take 10 mg by mouth daily. BRAND NAME ONLY.      Marland Kitchen Azelastine-Fluticasone 137-50 MCG/ACT SUSP Place 1-2 sprays into the nose at bedtime.  3 Bottle  3  . beclomethasone (QVAR) 40 MCG/ACT inhaler Inhale 2 puffs into the lungs 2 (two) times daily. Rinse mouth  3 Inhaler  1  . Cyanocobalamin (B-12 PO) Take 1 tablet by mouth daily.       . furosemide (LASIX) 20  MG tablet Take 40 mg by mouth daily.      Marland Kitchen glucose blood test strip Use as instructed  100 each  12  . Insulin Glargine (LANTUS SOLOSTAR [place]) Inject 15 Units into the skin at bedtime.       . Insulin Lispro, Human, (HUMALOG KWIKPEN [place]) Inject into the skin. 3 times a day (just before each meal) 07-02-18 units      . Insulin Pen Needle (PEN NEEDLES) 31G X 6 MM MISC Use as directed  400 each  3  . Multiple Vitamins-Minerals (CENTRUM SILVER PO) Take 1 tablet by mouth daily.        Marland Kitchen omeprazole (PRILOSEC) 20 MG capsule Take 20 mg by mouth daily.      . ramipril (ALTACE) 5 MG capsule Take 5 mg by mouth daily.      Marland Kitchen sulfamethoxazole-trimethoprim (SEPTRA DS) 800-160 MG per tablet Take 1 tablet by mouth every 12 (twelve) hours.  20 tablet  0   No current facility-administered medications on file prior to visit.  No Known Allergies  Family History  Problem Relation Age of Onset  . Heart attack Neg Hx   . Colon cancer Neg Hx   . Breast cancer Neg Hx    BP 148/72  Pulse 60  Temp(Src) 97.9 F (36.6 C) (Oral)  Resp 16  Ht 5\' 5"  (1.651 m)  Wt 236 lb (107.049 kg)  BMI 39.27 kg/m2  Review of Systems denies hypoglycemia and weight change    Objective:   Physical Exam VITAL SIGNS:  See vs page GENERAL: no distress  Lab Results  Component Value Date   HGBA1C 6.9* 01/12/2013      Assessment & Plan:  DM: well-controlled.  This insulin regimen was chosen from multiple options, as it best matches his insulin to his changing requirements throughout the day.  The benefits of glycemic control must be weighed against the risks of hypoglycemia.   Renal insuff: this increases the risk of hypoglycemia. Neuropathy: this limits exercise rx of DM.

## 2013-01-12 NOTE — Patient Instructions (Signed)
check your blood sugar 2 times a day.  vary the time of day when you check, between before the 3 meals, and at bedtime.  also check if you have symptoms of your blood sugar being too high or too low.  please keep a record of the readings and bring it to your next appointment here.  please call us sooner if your blood sugar goes below 70, or if you have a lot of readings over 200.   blood tests are being requested for you today.  We'll contact you with results. Please come back for a follow-up appointment in 3 months.

## 2013-01-24 ENCOUNTER — Encounter: Payer: Self-pay | Admitting: Internal Medicine

## 2013-01-24 ENCOUNTER — Ambulatory Visit (INDEPENDENT_AMBULATORY_CARE_PROVIDER_SITE_OTHER): Payer: Medicare Other | Admitting: Internal Medicine

## 2013-01-24 VITALS — BP 133/82 | HR 86 | Temp 98.4°F | Wt 231.0 lb

## 2013-01-24 DIAGNOSIS — I10 Essential (primary) hypertension: Secondary | ICD-10-CM | POA: Diagnosis not present

## 2013-01-24 DIAGNOSIS — J309 Allergic rhinitis, unspecified: Secondary | ICD-10-CM | POA: Diagnosis not present

## 2013-01-24 DIAGNOSIS — R059 Cough, unspecified: Secondary | ICD-10-CM | POA: Diagnosis not present

## 2013-01-24 DIAGNOSIS — J302 Other seasonal allergic rhinitis: Secondary | ICD-10-CM

## 2013-01-24 DIAGNOSIS — R05 Cough: Secondary | ICD-10-CM | POA: Diagnosis not present

## 2013-01-24 MED ORDER — HYDROCODONE-HOMATROPINE 5-1.5 MG/5ML PO SYRP: 5.0000 mL | ORAL_SOLUTION | Freq: Two times a day (BID) | ORAL | Status: DC | PRN

## 2013-01-24 MED ORDER — RAMIPRIL 5 MG PO CAPS: 5.0000 mg | ORAL_CAPSULE | Freq: Every day | ORAL | Status: DC

## 2013-01-24 NOTE — Assessment & Plan Note (Signed)
Weeks history of nasal congestion and cough likely from postnasal dripping. Not much evidence of an acute bacterial sinusitis on clinical grounds. Plan: Continue with dymista, add  Claritin, prescribed hydrocodone syrup which in the past has been very useful to suppress the cough. If not better, may need empiric antibiotics

## 2013-01-24 NOTE — Patient Instructions (Addendum)
Continue with all your medications including dymista : 2 nose sprays on each side  twice a day Robitussin DM OTC as needed for cough Take the cough syrup twice a day as needed for persistent cough, will cause drowsiness claritin 10 mg OTC 1 day for few days  Call if no better in few days

## 2013-01-24 NOTE — Assessment & Plan Note (Signed)
Refill ramipril

## 2013-01-24 NOTE — Progress Notes (Signed)
   Subjective:    Patient ID: Angel Green, female    DOB: [birth year], 72 y.o.   MRN: [id number]  HPI Acute visit Symptoms started 3 weeks ago with nasal congestion, some postnasal dripping, that is creating some cough mostly at night. The postnasal dripping is clear. Also needs a refill on her ACE inhibitors  Past Medical History  Diagnosis Date  . Hypertension   . Diabetes mellitus, type 2   . Hyperlipidemia   . Osteoarthritis   . Ankle pain     chronic  . Rhinitis     allergic nos  . RENAL INSUFFICIENCY, CHRONIC 04/16/2010   Past Surgical History  Procedure Laterality Date  . Cholecystectomy    . Partial hysterectomy      in her 41s  . Dental implants      Review of Systems Denies fever or chills No chest congestion or actual sputum production. Denies itchy eyes or itchy nose, no sneezing.    Objective:   Physical Exam BP 133/82  Pulse 86  Temp(Src) 98.4 F (36.9 C)  Wt 231 lb (104.781 kg)  SpO2 97% General -- alert, well-developed, NAD.  HEENT-- Not pale. TMs normal, throat symmetric, no redness or discharge. Face symmetric, sinuses not tender to palpation. Nose slt congested.  Lungs -- normal respiratory effort, no intercostal retractions, no accessory muscle use, and normal breath sounds.  Heart-- normal rate, regular rhythm, no murmur.  Neurologic--  alert & oriented X3.  Psych-- Cognition and judgment appear intact. Cooperative with normal attention span and concentration. No anxious appearing , no depressed appearing.      Assessment & Plan:

## 2013-01-24 NOTE — Progress Notes (Signed)
Pre visit review using our clinic review tool, if applicable. No additional management support is needed unless otherwise documented below in the visit note. 

## 2013-02-27 DIAGNOSIS — H02839 Dermatochalasis of unspecified eye, unspecified eyelid: Secondary | ICD-10-CM | POA: Diagnosis not present

## 2013-02-27 DIAGNOSIS — Z961 Presence of intraocular lens: Secondary | ICD-10-CM | POA: Diagnosis not present

## 2013-02-27 DIAGNOSIS — E119 Type 2 diabetes mellitus without complications: Secondary | ICD-10-CM | POA: Diagnosis not present

## 2013-02-27 DIAGNOSIS — H04129 Dry eye syndrome of unspecified lacrimal gland: Secondary | ICD-10-CM | POA: Diagnosis not present

## 2013-02-27 DIAGNOSIS — H40029 Open angle with borderline findings, high risk, unspecified eye: Secondary | ICD-10-CM | POA: Diagnosis not present

## 2013-02-28 ENCOUNTER — Other Ambulatory Visit: Payer: Self-pay | Admitting: Internal Medicine

## 2013-03-21 ENCOUNTER — Ambulatory Visit (INDEPENDENT_AMBULATORY_CARE_PROVIDER_SITE_OTHER): Payer: Medicare Other | Admitting: Internal Medicine

## 2013-03-21 ENCOUNTER — Encounter: Payer: Self-pay | Admitting: Internal Medicine

## 2013-03-21 VITALS — BP 155/78 | HR 90 | Temp 97.9°F | Wt 237.0 lb

## 2013-03-21 DIAGNOSIS — I10 Essential (primary) hypertension: Secondary | ICD-10-CM

## 2013-03-21 DIAGNOSIS — R05 Cough: Secondary | ICD-10-CM | POA: Diagnosis not present

## 2013-03-21 DIAGNOSIS — J302 Other seasonal allergic rhinitis: Secondary | ICD-10-CM

## 2013-03-21 DIAGNOSIS — R059 Cough, unspecified: Secondary | ICD-10-CM | POA: Diagnosis not present

## 2013-03-21 DIAGNOSIS — J309 Allergic rhinitis, unspecified: Secondary | ICD-10-CM | POA: Diagnosis not present

## 2013-03-21 DIAGNOSIS — J3089 Other allergic rhinitis: Secondary | ICD-10-CM

## 2013-03-21 MED ORDER — HYDROCODONE-HOMATROPINE 5-1.5 MG/5ML PO SYRP: 5.0000 mL | ORAL_SOLUTION | Freq: Two times a day (BID) | ORAL | Status: DC | PRN

## 2013-03-21 MED ORDER — LOSARTAN POTASSIUM 50 MG PO TABS: 50.0000 mg | ORAL_TABLET | Freq: Every day | ORAL | Status: DC

## 2013-03-21 MED ORDER — AZITHROMYCIN 250 MG PO TABS: ORAL_TABLET | ORAL | Status: DC

## 2013-03-21 NOTE — Assessment & Plan Note (Signed)
BP slightly elevated today but ambulatory BPs ok, Tolerating amlodipine well She is having persisting cough Plan: Discontinue ramipril, start losartan

## 2013-03-21 NOTE — Progress Notes (Signed)
Pre visit review using our clinic review tool, if applicable. No additional management support is needed unless otherwise documented below in the visit note. 

## 2013-03-21 NOTE — Progress Notes (Signed)
   Subjective:    Patient ID: Angel Green, female    DOB: [birth year], 73 y.o.   MRN: [id number]  HPI Here to discuss the following issues Hypertension--amlodipine was added, no apparent side effects, BP today slightly elevated but at home is around 130/70. She continue with mild throat/nose congestion and  persisting nocturnal cough. She was seen a few weeks ago prescribed hydrocodone, Once she ran out of hydrocodone the symptoms came right back.   Past Medical History  Diagnosis Date  . Hypertension   . Diabetes mellitus, type 2   . Hyperlipidemia   . Osteoarthritis   . Ankle pain     chronic  . Rhinitis     allergic nos  . RENAL INSUFFICIENCY, CHRONIC 04/16/2010   Past Surgical History  Procedure Laterality Date  . Cholecystectomy    . Partial hysterectomy      in her 60s  . Dental implants       Review of Systems No fever + Postnasal dripping. No actual sinus pain Denies nausea, vomiting, diarrhea.     Objective:   Physical Exam BP 155/78  Pulse 90  Temp(Src) 97.9 F (36.6 C)  Wt 237 lb (107.502 kg)  SpO2 96% General -- alert, well-developed, NAD.   HEENT-- Not pale. TMs normal, throat symmetric, no redness or discharge. Face symmetric, sinuses not tender to palpation. Nose congested.  Lungs -- normal respiratory effort, no intercostal retractions, no accessory muscle use, and normal breath sounds.  Heart-- normal rate, regular rhythm, no murmur.  Neurologic--  alert & oriented X3. Speech normal, gait normal, strength normal in all extremities.  Psych-- Cognition and judgment appear intact. Cooperative with normal attention span and concentration. No anxious or depressed appearing.      Assessment & Plan:

## 2013-03-21 NOTE — Patient Instructions (Addendum)
Stop ramipril Start losartan Check the  blood pressure 2 or 3 times a  week be sure it is between 110/60 and 140/85. Ideal blood pressure is 120/80. If it is consistently higher or lower, let me know  For cough: Hydrocodone as needed Continue nasal sprays zithromax   Next vusit in 1 month to recheck your BP and do labs

## 2013-03-21 NOTE — Assessment & Plan Note (Addendum)
Ongoing sinus congestion,  persisting cough. Failure to improve after allergy treatment.Patient is taking ACE inhibitors. Plan: Discontinue ACE i------>  ARBs Doubt infection but will cover with zpack (atypical infection?) Followup 4 weeks

## 2013-03-26 ENCOUNTER — Telehealth: Payer: Self-pay | Admitting: Internal Medicine

## 2013-03-26 NOTE — Telephone Encounter (Signed)
Relevant patient education mailed to patient.  

## 2013-04-13 ENCOUNTER — Other Ambulatory Visit: Payer: Self-pay | Admitting: Internal Medicine

## 2013-04-16 ENCOUNTER — Encounter: Payer: Self-pay | Admitting: Endocrinology

## 2013-04-16 ENCOUNTER — Ambulatory Visit (INDEPENDENT_AMBULATORY_CARE_PROVIDER_SITE_OTHER): Payer: Medicare Other | Admitting: Endocrinology

## 2013-04-16 VITALS — BP 132/73 | HR 111 | Temp 97.9°F | Ht 65.0 in | Wt 239.0 lb

## 2013-04-16 DIAGNOSIS — E119 Type 2 diabetes mellitus without complications: Secondary | ICD-10-CM

## 2013-04-16 LAB — HEMOGLOBIN A1C: Hgb A1c MFr Bld: 7.4 % — ABNORMAL HIGH (ref 4.6–6.5)

## 2013-04-16 NOTE — Patient Instructions (Signed)
check your blood sugar 2 times a day.  vary the time of day when you check, between before the 3 meals, and at bedtime.  also check if you have symptoms of your blood sugar being too high or too low.  please keep a record of the readings and bring it to your next appointment here.  please call us sooner if your blood sugar goes below 70, or if you have a lot of readings over 200.   blood tests are being requested for you today.  We'll contact you with results.  Please come back for a follow-up appointment in 3 months.

## 2013-04-16 NOTE — Progress Notes (Signed)
Subjective:    Patient ID: Angel Green, female    DOB: 1940-03-09, 73 y.o.   MRN: [id number]  HPI pt returns for f/u of insulin-requiring DM (dx'ed 1999, on a routine blood test; she has moderate neuropathy of the lower extremities and associated renal insufficiency; she has been on insulin since 2010; she takes multiple daily injections; she has never had severe hypoglycemia or DKA).  she brings a record of her cbg's which i have reviewed today.  It varies from 74-150.  There is no trend throughout the day.   Past Medical History  Diagnosis Date  . Hypertension   . Diabetes mellitus, type 2   . Hyperlipidemia   . Osteoarthritis   . Ankle pain     chronic  . Rhinitis     allergic nos  . RENAL INSUFFICIENCY, CHRONIC 04/16/2010    Past Surgical History  Procedure Laterality Date  . Cholecystectomy    . Partial hysterectomy      in her 5s  . Dental implants      History   Social History  . Marital Status: Married    Spouse Name: N/A    Number of Children: 2  . Years of Education: N/A   Occupational History  .  retired    Social History Main Topics  . Smoking status: Never Smoker   . Smokeless tobacco: Never Used  . Alcohol Use: No  . Drug Use: No  . Sexual Activity: Not on file   Other Topics Concern  . Not on file   Social History Narrative    husband is a Company secretary                Current Outpatient Prescriptions on File Prior to Visit  Medication Sig Dispense Refill  . amLODipine (NORVASC) 5 MG tablet Take 1 tablet (5 mg total) by mouth daily.  90 tablet  3  . aspirin 81 MG tablet Take 81 mg by mouth daily.        Marland Kitchen atorvastatin (LIPITOR) 10 MG tablet TAKE 1 TABLET DAILY  90 tablet  0  . azithromycin (ZITHROMAX Z-PAK) 250 MG tablet As directed  6 each  0  . Cyanocobalamin (B-12 PO) Take 1 tablet by mouth daily.       Marland Kitchen DYMISTA 137-50 MCG/ACT SUSP USE 1 TO 2 SPRAYS NASALLY AT BEDTIME  1 Bottle  2  . furosemide (LASIX) 20 MG tablet Take 40 mg by mouth  daily.      Marland Kitchen glucose blood test strip Use as instructed  100 each  12  . HYDROcodone-homatropine (HYCODAN) 5-1.5 MG/5ML syrup Take 5 mLs by mouth 2 (two) times daily as needed for cough.  180 mL  0  . Insulin Glargine (LANTUS SOLOSTAR [place]) Inject 15 Units into the skin at bedtime.       . Insulin Lispro, Human, (HUMALOG KWIKPEN ) Inject into the skin. 3 times a day (just before each meal) 07-02-18 units      . Insulin Pen Needle (PEN NEEDLES) 31G X 6 MM MISC Use as directed  400 each  3  . losartan (COZAAR) 50 MG tablet Take 1 tablet (50 mg total) by mouth daily.  30 tablet  1  . Multiple Vitamins-Minerals (CENTRUM SILVER PO) Take 1 tablet by mouth daily.        Marland Kitchen omeprazole (PRILOSEC) 20 MG capsule TAKE 1 CAPSULE DAILY  90 capsule  0  . beclomethasone (QVAR) 40 MCG/ACT inhaler Inhale 2 puffs  into the lungs 2 (two) times daily. Rinse mouth  3 Inhaler  1   No current facility-administered medications on file prior to visit.    No Known Allergies  Family History  Problem Relation Age of Onset  . Heart attack Neg Hx   . Colon cancer Neg Hx   . Breast cancer Neg Hx     BP 132/73  Pulse 111  Temp(Src) 97.9 F (36.6 C) (Oral)  Ht 5\' 5"  (1.651 m)  Wt 239 lb (108.41 kg)  BMI 39.77 kg/m2  SpO2 95%  Review of Systems denies hypoglycemia and weight change.     Objective:   Physical Exam VITAL SIGNS:  See vs page GENERAL: no distress  Lab Results  Component Value Date   HGBA1C 7.4* 04/16/2013      Assessment & Plan:  DM: she needs increased rx.  However, we can't increase insulin now, due to variable cbg's.  Renal insuff: this increases the risk of hypoglycemia. Neuropathy: this limits exercise rx of DM.

## 2013-04-18 ENCOUNTER — Ambulatory Visit (INDEPENDENT_AMBULATORY_CARE_PROVIDER_SITE_OTHER): Payer: Medicare Other | Admitting: Internal Medicine

## 2013-04-18 ENCOUNTER — Encounter: Payer: Self-pay | Admitting: Internal Medicine

## 2013-04-18 ENCOUNTER — Ambulatory Visit (HOSPITAL_BASED_OUTPATIENT_CLINIC_OR_DEPARTMENT_OTHER)
Admission: RE | Admit: 2013-04-18 | Discharge: 2013-04-18 | Disposition: A | Discharge status: Home / Self Care | Payer: Medicare Other | Source: Ambulatory Visit | Attending: Internal Medicine | Admitting: Internal Medicine

## 2013-04-18 VITALS — BP 139/79 | HR 95 | Temp 97.9°F | Wt 239.0 lb

## 2013-04-18 DIAGNOSIS — J3089 Other allergic rhinitis: Secondary | ICD-10-CM

## 2013-04-18 DIAGNOSIS — I10 Essential (primary) hypertension: Secondary | ICD-10-CM | POA: Diagnosis not present

## 2013-04-18 DIAGNOSIS — R059 Cough, unspecified: Secondary | ICD-10-CM | POA: Diagnosis not present

## 2013-04-18 DIAGNOSIS — R05 Cough: Secondary | ICD-10-CM | POA: Diagnosis not present

## 2013-04-18 DIAGNOSIS — J309 Allergic rhinitis, unspecified: Secondary | ICD-10-CM

## 2013-04-18 DIAGNOSIS — J302 Other seasonal allergic rhinitis: Secondary | ICD-10-CM

## 2013-04-18 MED ORDER — LOSARTAN POTASSIUM 50 MG PO TABS: 50.0000 mg | ORAL_TABLET | Freq: Every day | ORAL | Status: DC

## 2013-04-18 MED ORDER — HYDROCODONE-HOMATROPINE 5-1.5 MG/5ML PO SYRP: 5.0000 mL | ORAL_SOLUTION | Freq: Two times a day (BID) | ORAL | Status: DC | PRN

## 2013-04-18 NOTE — Assessment & Plan Note (Signed)
See previous entry, did d/c ramipril, did not start losartan. Ambulatory BPs reportedly normal. She is diabetic consequently we'll insist on losartan. Plan: Paper prescription for losartan, BMP 2 weeks see instructions

## 2013-04-18 NOTE — Progress Notes (Signed)
Subjective:    Patient ID: Angel Green, female    DOB: Jun 21, 1940, 73 y.o.   MRN: [id number]  DOS:  04/18/2013 Reason for visit: Followup from last visit Allergies and cough--about the same, continue with nocturnal cough, postnasal dripping and accumulation of fleeting in the throat. Hypertension--not taking ACE inhibitors however she did not start losartan, apparently never got a supply   ROS  No fever, chills   Ambulatory BPs within normal per patient. Denies sinus pain, continue with sinus congestion   Past Medical History  Diagnosis Date  . Hypertension   . Diabetes mellitus, type 2   . Hyperlipidemia   . Osteoarthritis   . Ankle pain     chronic  . Rhinitis     allergic nos  . RENAL INSUFFICIENCY, CHRONIC 04/16/2010    Past Surgical History  Procedure Laterality Date  . Cholecystectomy    . Partial hysterectomy      in her 21s  . Dental implants      History   Social History  . Marital Status: Married    Spouse Name: N/A    Number of Children: 2  . Years of Education: N/A   Occupational History  .  retired    Social History Main Topics  . Smoking status: Never Smoker   . Smokeless tobacco: Never Used  . Alcohol Use: No  . Drug Use: No  . Sexual Activity: Not on file   Other Topics Concern  . Not on file   Social History Narrative    husband is a Company secretary                    Medication List       This list is accurate as of: 04/18/13 11:59 PM.  Always use your most recent med list.               amLODipine 5 MG tablet  Commonly known as:  NORVASC  Take 1 tablet (5 mg total) by mouth daily.     aspirin 81 MG tablet  Take 81 mg by mouth daily.     atorvastatin 10 MG tablet  Commonly known as:  LIPITOR  TAKE 1 TABLET DAILY     B-12 PO  Take 1 tablet by mouth daily.     beclomethasone 40 MCG/ACT inhaler  Commonly known as:  QVAR  Inhale 2 puffs into the lungs 2 (two) times daily. Rinse mouth     CENTRUM SILVER PO  Take  1 tablet by mouth daily.     DYMISTA 137-50 MCG/ACT Susp  Generic drug:  Azelastine-Fluticasone  USE 1 TO 2 SPRAYS NASALLY AT BEDTIME     furosemide 20 MG tablet  Commonly known as:  LASIX  Take 40 mg by mouth daily.     glucose blood test strip  Use as instructed     HUMALOG KWIKPEN [place]  Inject into the skin. 3 times a day (just before each meal) 07-02-18 units     HYDROcodone-homatropine 5-1.5 MG/5ML syrup  Commonly known as:  HYCODAN  Take 5 mLs by mouth 2 (two) times daily as needed for cough.     LANTUS SOLOSTAR [place]  Inject 15 Units into the skin at bedtime.     losartan 50 MG tablet  Commonly known as:  COZAAR  Take 1 tablet (50 mg total) by mouth daily.     omeprazole 20 MG capsule  Commonly known as:  PRILOSEC  TAKE 1 CAPSULE  DAILY     Pen Needles 31G X 6 MM Misc  Use as directed           Objective:   Physical Exam BP 139/79  Pulse 95  Temp(Src) 97.9 F (36.6 C)  Wt 239 lb (108.41 kg)  SpO2 100% General -- alert, well-developed, NAD.  HEENT-- Not pale.  Nose quite congested.  Lungs -- normal respiratory effort, no intercostal retractions, no accessory muscle use, and normal breath sounds.  Heart-- normal rate, regular rhythm, no murmur.   Extremities-- no pretibial edema bilaterally  Neurologic--  alert & oriented X3.   Psych-- Cognition and judgment appear intact. Cooperative with normal attention span and concentration. No anxious or depressed appearing.      Assessment & Plan:

## 2013-04-18 NOTE — Progress Notes (Signed)
Pre visit review using our clinic review tool, if applicable. No additional management support is needed unless otherwise documented below in the visit note. 

## 2013-04-18 NOTE — Assessment & Plan Note (Addendum)
Patient stopped ACEi (did not add ARBS, see HTN) yet continue w/ nasal congestion, nocturnal cough, pnd Plan:  CXR Allergy referral

## 2013-04-18 NOTE — Patient Instructions (Addendum)
Get the XR at [facility], corner of [address] and [address] (10 minutes form here); they are open 24/7 [place], [place] [address] [phone]   Take medications as prescribed  Check the  blood pressure 2 or 3 times a   week be sure it is between 110/60 and 140/85. Ideal blood pressure is 120/80. If it is consistently higher or lower, let me know  Schedule labs 2-3 weeks from now: BMP dx HTN (you don't need to see me     Next visit to see me in 4-5 months

## 2013-04-20 ENCOUNTER — Telehealth: Payer: Self-pay | Admitting: Internal Medicine

## 2013-04-20 NOTE — Telephone Encounter (Signed)
Relevant patient education assigned to patient using Emmi. ° °

## 2013-04-30 DIAGNOSIS — N039 Chronic nephritic syndrome with unspecified morphologic changes: Secondary | ICD-10-CM | POA: Diagnosis not present

## 2013-04-30 DIAGNOSIS — D631 Anemia in chronic kidney disease: Secondary | ICD-10-CM | POA: Diagnosis not present

## 2013-04-30 DIAGNOSIS — I129 Hypertensive chronic kidney disease with stage 1 through stage 4 chronic kidney disease, or unspecified chronic kidney disease: Secondary | ICD-10-CM | POA: Diagnosis not present

## 2013-04-30 DIAGNOSIS — N2581 Secondary hyperparathyroidism of renal origin: Secondary | ICD-10-CM | POA: Diagnosis not present

## 2013-04-30 DIAGNOSIS — N183 Chronic kidney disease, stage 3 unspecified: Secondary | ICD-10-CM | POA: Diagnosis not present

## 2013-05-02 ENCOUNTER — Encounter (HOSPITAL_COMMUNITY): Payer: Self-pay | Admitting: Emergency Medicine

## 2013-05-02 ENCOUNTER — Emergency Department (HOSPITAL_COMMUNITY)
Admission: EM | Admit: 2013-05-02 | Discharge: 2013-05-02 | Disposition: A | Discharge status: Home / Self Care | Payer: Medicare Other | Attending: Emergency Medicine | Admitting: Emergency Medicine

## 2013-05-02 DIAGNOSIS — Z7982 Long term (current) use of aspirin: Secondary | ICD-10-CM | POA: Insufficient documentation

## 2013-05-02 DIAGNOSIS — J329 Chronic sinusitis, unspecified: Secondary | ICD-10-CM | POA: Diagnosis not present

## 2013-05-02 DIAGNOSIS — Z794 Long term (current) use of insulin: Secondary | ICD-10-CM | POA: Diagnosis not present

## 2013-05-02 DIAGNOSIS — E119 Type 2 diabetes mellitus without complications: Secondary | ICD-10-CM | POA: Insufficient documentation

## 2013-05-02 DIAGNOSIS — Z87448 Personal history of other diseases of urinary system: Secondary | ICD-10-CM | POA: Insufficient documentation

## 2013-05-02 DIAGNOSIS — M1990 Unspecified osteoarthritis, unspecified site: Secondary | ICD-10-CM | POA: Diagnosis not present

## 2013-05-02 DIAGNOSIS — J0100 Acute maxillary sinusitis, unspecified: Secondary | ICD-10-CM | POA: Diagnosis not present

## 2013-05-02 DIAGNOSIS — G8929 Other chronic pain: Secondary | ICD-10-CM | POA: Insufficient documentation

## 2013-05-02 DIAGNOSIS — E785 Hyperlipidemia, unspecified: Secondary | ICD-10-CM | POA: Insufficient documentation

## 2013-05-02 DIAGNOSIS — Z79899 Other long term (current) drug therapy: Secondary | ICD-10-CM | POA: Diagnosis not present

## 2013-05-02 DIAGNOSIS — I10 Essential (primary) hypertension: Secondary | ICD-10-CM | POA: Diagnosis not present

## 2013-05-02 DIAGNOSIS — J0110 Acute frontal sinusitis, unspecified: Secondary | ICD-10-CM | POA: Diagnosis not present

## 2013-05-02 MED ORDER — TRAMADOL HCL 50 MG PO TABS: 50.0000 mg | ORAL_TABLET | Freq: Four times a day (QID) | ORAL | Status: DC | PRN

## 2013-05-02 MED ORDER — MOMETASONE FUROATE 50 MCG/ACT NA SUSP: 2.0000 | Freq: Every day | NASAL | Status: DC

## 2013-05-02 MED ORDER — OXYMETAZOLINE HCL 0.05 % NA SOLN: 1.0000 | Freq: Two times a day (BID) | NASAL | Status: DC

## 2013-05-02 NOTE — Discharge Instructions (Signed)
Followup with your primary doctor in a couple days to assure resolution of your symptoms. Return immediately to the emergency department for worsening pain, changes in your vision, fever or any concerns  Sinusitis Sinusitis is redness, soreness, and swelling (inflammation) of the paranasal sinuses. Paranasal sinuses are air pockets within the bones of your face (beneath the eyes, the middle of the forehead, or above the eyes). In healthy paranasal sinuses, mucus is able to drain out, and air is able to circulate through them by way of your nose. However, when your paranasal sinuses are inflamed, mucus and air can become trapped. This can allow bacteria and other germs to grow and cause infection. Sinusitis can develop quickly and last only a short time (acute) or continue over a long period (chronic). Sinusitis that lasts for more than 12 weeks is considered chronic.  CAUSES  Causes of sinusitis include:  Allergies.  Structural abnormalities, such as displacement of the cartilage that separates your nostrils (deviated septum), which can decrease the air flow through your nose and sinuses and affect sinus drainage.  Functional abnormalities, such as when the small hairs (cilia) that line your sinuses and help remove mucus do not work properly or are not present. SYMPTOMS  Symptoms of acute and chronic sinusitis are the same. The primary symptoms are pain and pressure around the affected sinuses. Other symptoms include:  Upper toothache.  Earache.  Headache.  Bad breath.  Decreased sense of smell and taste.  A cough, which worsens when you are lying flat.  Fatigue.  Fever.  Thick drainage from your nose, which often is green and may contain pus (purulent).  Swelling and warmth over the affected sinuses. DIAGNOSIS  Your caregiver will perform a physical exam. During the exam, your caregiver may:  Look in your nose for signs of abnormal growths in your nostrils (nasal  polyps).  Tap over the affected sinus to check for signs of infection.  View the inside of your sinuses (endoscopy) with a special imaging device with a light attached (endoscope), which is inserted into your sinuses. If your caregiver suspects that you have chronic sinusitis, one or more of the following tests may be recommended:  Allergy tests.  Nasal culture A sample of mucus is taken from your nose and sent to a lab and screened for bacteria.  Nasal cytology A sample of mucus is taken from your nose and examined by your caregiver to determine if your sinusitis is related to an allergy. TREATMENT  Most cases of acute sinusitis are related to a viral infection and will resolve on their own within 10 days. Sometimes medicines are prescribed to help relieve symptoms (pain medicine, decongestants, nasal steroid sprays, or saline sprays).  However, for sinusitis related to a bacterial infection, your caregiver will prescribe antibiotic medicines. These are medicines that will help kill the bacteria causing the infection.  Rarely, sinusitis is caused by a fungal infection. In theses cases, your caregiver will prescribe antifungal medicine. For some cases of chronic sinusitis, surgery is needed. Generally, these are cases in which sinusitis recurs more than 3 times per year, despite other treatments. HOME CARE INSTRUCTIONS   Drink plenty of water. Water helps thin the mucus so your sinuses can drain more easily.  Use a humidifier.  Inhale steam 3 to 4 times a day (for example, sit in the bathroom with the shower running).  Apply a warm, moist washcloth to your face 3 to 4 times a day, or as directed by your  caregiver.  Use saline nasal sprays to help moisten and clean your sinuses.  Take over-the-counter or prescription medicines for pain, discomfort, or fever only as directed by your caregiver. SEEK IMMEDIATE MEDICAL CARE IF:  You have increasing pain or severe headaches.  You have  nausea, vomiting, or drowsiness.  You have swelling around your face.  You have vision problems.  You have a stiff neck.  You have difficulty breathing. MAKE SURE YOU:   Understand these instructions.  Will watch your condition.  Will get help right away if you are not doing well or get worse. Document Released: 02/08/2005 Document Revised: 05/03/2011 Document Reviewed: 02/23/2011 [facility] Patient Information 2014 [place], [place].

## 2013-05-02 NOTE — ED Provider Notes (Signed)
CSN: [id number]     Arrival date & time 05/02/13  [id number] History   First MD Initiated Contact with Patient 05/02/13 [phone]     Chief Complaint  Patient presents with  . Eye Pain     (Consider location/radiation/quality/duration/timing/severity/associated sxs/prior Treatment) HPI Patient presents with 5-6 hours of left-sided facial pain. She denies specifically globe pain. she has no visual changes. She has no trauma. She denies any neck pain or stiffness. She denies any URI symptoms. She denies fevers or chills. She has no focal weakness or numbness. Past Medical History  Diagnosis Date  . Hypertension   . Diabetes mellitus, type 2   . Hyperlipidemia   . Osteoarthritis   . Ankle pain     chronic  . Rhinitis     allergic nos  . RENAL INSUFFICIENCY, CHRONIC 04/16/2010   Past Surgical History  Procedure Laterality Date  . Cholecystectomy    . Partial hysterectomy      in her 76s  . Dental implants     Family History  Problem Relation Age of Onset  . Heart attack Neg Hx   . Colon cancer Neg Hx   . Breast cancer Neg Hx    History  Substance Use Topics  . Smoking status: Never Smoker   . Smokeless tobacco: Never Used  . Alcohol Use: No   OB History   Grav Para Term Preterm Abortions TAB SAB Ect Mult Living                 Review of Systems  Constitutional: Negative for fever and chills.  HENT: Positive for sinus pressure. Negative for congestion and sore throat.   Eyes: Negative for photophobia, pain, discharge, redness, itching and visual disturbance.  Respiratory: Negative for shortness of breath.   Cardiovascular: Negative for chest pain.  Gastrointestinal: Negative for nausea, vomiting and abdominal pain.  Musculoskeletal: Negative for back pain, neck pain and neck stiffness.  Skin: Negative for pallor, rash and wound.  Neurological: Negative for dizziness, weakness, light-headedness, numbness and headaches.  All other systems reviewed and are  negative.      Allergies  Review of patient's allergies indicates no known allergies.  Home Medications   Current Outpatient Rx  Name  Route  Sig  Dispense  Refill  . amLODipine (NORVASC) 5 MG tablet   Oral   Take 1 tablet (5 mg total) by mouth daily.   90 tablet   3   . aspirin 81 MG tablet   Oral   Take 81 mg by mouth daily.           Marland Kitchen atorvastatin (LIPITOR) 10 MG tablet      TAKE 1 TABLET DAILY   90 tablet   0   . EXPIRED: beclomethasone (QVAR) 40 MCG/ACT inhaler   Inhalation   Inhale 2 puffs into the lungs 2 (two) times daily. Rinse mouth   3 Inhaler   1   . Cyanocobalamin (B-12 PO)   Oral   Take 1 tablet by mouth daily.          Marland Kitchen DYMISTA 137-50 MCG/ACT SUSP      USE 1 TO 2 SPRAYS NASALLY AT BEDTIME   1 Bottle   2   . furosemide (LASIX) 20 MG tablet   Oral   Take 40 mg by mouth daily.         Marland Kitchen glucose blood test strip      Use as instructed   100 each  12     Check twice daily as directed.  Dx: 250.00  Cle ...   . HYDROcodone-homatropine (HYCODAN) 5-1.5 MG/5ML syrup   Oral   Take 5 mLs by mouth 2 (two) times daily as needed for cough.   180 mL   0   . Insulin Glargine (LANTUS SOLOSTAR [place])   Subcutaneous   Inject 15 Units into the skin at bedtime.          . Insulin Lispro, Human, (HUMALOG KWIKPEN [place])   Subcutaneous   Inject into the skin. 3 times a day (just before each meal) 07-02-18 units         . Insulin Pen Needle (PEN NEEDLES) 31G X 6 MM MISC      Use as directed   400 each   3     Patient Requires Extra Quantity Dispensed for [2]  ...   . losartan (COZAAR) 50 MG tablet   Oral   Take 1 tablet (50 mg total) by mouth daily.   30 tablet   1   . Multiple Vitamins-Minerals (CENTRUM SILVER PO)   Oral   Take 1 tablet by mouth daily.           Marland Kitchen omeprazole (PRILOSEC) 20 MG capsule      TAKE 1 CAPSULE DAILY   90 capsule   0    BP 163/87  Pulse 99  Temp(Src) 97.9 F (36.6 C) (Oral)  Resp 18  Ht 5'  5" (1.651 m)  Wt 237 lb (107.502 kg)  BMI 39.44 kg/m2  SpO2 100% Physical Exam  Nursing note and vitals reviewed. Constitutional: She is oriented to person, place, and time. She appears well-developed and well-nourished. No distress.  HENT:  Head: Normocephalic and atraumatic.  Mouth/Throat: Oropharynx is clear and moist.  Patient with left greater than right nasal mucosal edema. She has tenderness to percussion over her left frontal and maxillary sinus.  Eyes: EOM are normal. Pupils are equal, round, and reactive to light.  Patient with irregular pupils due to previous cataract surgery. No hyphema is present. No foreign bodies present. Full range of extraocular motion. Normal conjunctiva with no discharge.  Neck: Normal range of motion. Neck supple.  No meningismus  Cardiovascular: Normal rate and regular rhythm.   Pulmonary/Chest: Effort normal and breath sounds normal. No respiratory distress. She has no wheezes. She has no rales.  Abdominal: Soft. Bowel sounds are normal. She exhibits no distension and no mass. There is no tenderness. There is no rebound and no guarding.  Musculoskeletal: Normal range of motion. She exhibits no edema and no tenderness.  Neurological: She is alert and oriented to person, place, and time.  Patient is alert and oriented x3 with clear, goal oriented speech. Patient has 5/5 motor in all extremities. Sensation is intact to light touch. Patient has a normal gait and walks without assistance.   Skin: Skin is warm and dry. No rash noted. No erythema.  Psychiatric: She has a normal mood and affect. Her behavior is normal.    ED Course  Procedures (including critical care time) Labs Review Labs Reviewed - No data to display Imaging Review No results found.   EKG Interpretation None      MDM   Final diagnoses:  Sinusitis    Patient is advised to followup with her primary Dr. in 2 days for reevaluation. She has no visual symptoms and is complaining  more of a peri-orbital pain in the actual ocular pain. Her physical exam  is consistent with sinusitis. We'll treat with nasal decongestants. She's been advised to return for visual changes, worsening pain, fever or any concerns.    Julianne Rice, MD 05/02/13 [phone]

## 2013-05-02 NOTE — ED Notes (Signed)
Pt reports left eye swelling and soreness x 4 hours. Pt denies injury to eye. NAD at this time. Pt alert x4.

## 2013-05-09 ENCOUNTER — Other Ambulatory Visit (INDEPENDENT_AMBULATORY_CARE_PROVIDER_SITE_OTHER): Payer: Medicare Other

## 2013-05-09 DIAGNOSIS — I10 Essential (primary) hypertension: Secondary | ICD-10-CM

## 2013-05-09 LAB — BASIC METABOLIC PANEL
BUN: 14 mg/dL (ref 6–23)
CHLORIDE: 101 meq/L (ref 96–112)
CO2: 30 mEq/L (ref 19–32)
Calcium: 9.2 mg/dL (ref 8.4–10.5)
Creatinine, Ser: 1.7 mg/dL — ABNORMAL HIGH (ref 0.4–1.2)
GFR: 38.45 mL/min — AB (ref >60.00)
GLUCOSE: 130 mg/dL — AB (ref 70–99)
POTASSIUM: 4.1 meq/L (ref 3.5–5.1)
Sodium: 138 mEq/L (ref 135–145)

## 2013-05-13 ENCOUNTER — Other Ambulatory Visit: Payer: Self-pay | Admitting: Internal Medicine

## 2013-05-15 ENCOUNTER — Encounter: Payer: Self-pay | Admitting: Internal Medicine

## 2013-05-15 ENCOUNTER — Ambulatory Visit (INDEPENDENT_AMBULATORY_CARE_PROVIDER_SITE_OTHER): Payer: Medicare Other | Admitting: Internal Medicine

## 2013-05-15 ENCOUNTER — Telehealth: Payer: Self-pay

## 2013-05-15 ENCOUNTER — Other Ambulatory Visit: Payer: Self-pay | Admitting: Internal Medicine

## 2013-05-15 VITALS — BP 138/76 | HR 99 | Temp 97.8°F | Ht 64.3 in | Wt 239.0 lb

## 2013-05-15 DIAGNOSIS — R5383 Other fatigue: Secondary | ICD-10-CM | POA: Diagnosis not present

## 2013-05-15 DIAGNOSIS — J3089 Other allergic rhinitis: Principal | ICD-10-CM

## 2013-05-15 DIAGNOSIS — J309 Allergic rhinitis, unspecified: Secondary | ICD-10-CM | POA: Diagnosis not present

## 2013-05-15 DIAGNOSIS — I10 Essential (primary) hypertension: Secondary | ICD-10-CM | POA: Diagnosis not present

## 2013-05-15 DIAGNOSIS — R5381 Other malaise: Secondary | ICD-10-CM | POA: Diagnosis not present

## 2013-05-15 DIAGNOSIS — J302 Other seasonal allergic rhinitis: Secondary | ICD-10-CM

## 2013-05-15 NOTE — Progress Notes (Signed)
Subjective:    Patient ID: Angel Green, female    DOB: 11-18-1940, 73 y.o.   MRN: [id number]  DOS:  05/15/2013 Type of  visit: The following issues were discussed   Seen at the  Emergency room 05-02-13 with congestion mostly at the left side of the face, diagnosed with sinusitis, was rx nasal decongestants, initially improved but now she is back to where she was: Bilateral sinus congestion, head feels heavy. Denies fever or chills. Continue with fatigue, denies snoring, denies depression. Continue with cough which is about the same, it may be with or without sputum production   ROS See history of present illness  Past Medical History  Diagnosis Date  . Hypertension   . Diabetes mellitus, type 2   . Hyperlipidemia   . Osteoarthritis   . Ankle pain     chronic  . Rhinitis     allergic nos  . RENAL INSUFFICIENCY, CHRONIC 04/16/2010    Past Surgical History  Procedure Laterality Date  . Cholecystectomy    . Partial hysterectomy      in her 61s  . Dental implants      History   Social History  . Marital Status: Married    Spouse Name: N/A    Number of Children: 2  . Years of Education: N/A   Occupational History  .  retired    Social History Main Topics  . Smoking status: Never Smoker   . Smokeless tobacco: Never Used  . Alcohol Use: No  . Drug Use: No  . Sexual Activity: Not on file   Other Topics Concern  . Not on file   Social History Narrative    husband is a Company secretary                    Medication List       This list is accurate as of: 05/15/13 11:59 PM.  Always use your most recent med list.               amLODipine 5 MG tablet  Commonly known as:  NORVASC  Take 1 tablet (5 mg total) by mouth daily.     aspirin 81 MG tablet  Take 81 mg by mouth daily.     atorvastatin 10 MG tablet  Commonly known as:  LIPITOR  TAKE 1 TABLET DAILY     B-12 PO  Take 1 tablet by mouth daily.     beclomethasone 40 MCG/ACT inhaler  Commonly  known as:  QVAR  Inhale 2 puffs into the lungs 2 (two) times daily. Rinse mouth     beclomethasone 40 MCG/ACT inhaler  Commonly known as:  QVAR  Inhale 2 puffs into the lungs 2 (two) times daily.     CENTRUM SILVER PO  Take 1 tablet by mouth daily.     DYMISTA 137-50 MCG/ACT Susp  Generic drug:  Azelastine-Fluticasone  USE 1 TO 2 SPRAYS NASALLY AT BEDTIME     furosemide 20 MG tablet  Commonly known as:  LASIX  TAKE 2 TABLETS DAILY     glucose blood test strip  Use as instructed     HUMALOG KWIKPEN 100 UNIT/ML KiwkPen  Generic drug:  insulin lispro  INJECT THREE TIMES A DAY JUST BEFORE EACH MEAL, 5 UNITS BEFORE BREAKFAST, 10 UNITS BEFORE LUNCH AND 15 UNITS BEFORE DINNER.     HYDROcodone-homatropine 5-1.5 MG/5ML syrup  Commonly known as:  HYCODAN  Take 5 mLs by mouth 2 (two) times  daily as needed for cough.     LANTUS SOLOSTAR [place]  Inject 15 Units into the skin at bedtime.     losartan 50 MG tablet  Commonly known as:  COZAAR  Take 1 tablet (50 mg total) by mouth daily.     omeprazole 20 MG capsule  Commonly known as:  PRILOSEC  TAKE 1 CAPSULE DAILY     Pen Needles 31G X 6 MM Misc  Use as directed     traMADol 50 MG tablet  Commonly known as:  ULTRAM  Take 1 tablet (50 mg total) by mouth every 6 (six) hours as needed.          Objective:   Physical Exam BP 138/76  Pulse 99  Temp(Src) 97.8 F (36.6 C)  Ht 5' 4.3" (1.633 m)  Wt 239 lb (108.41 kg)  BMI 40.65 kg/m2  SpO2 99% General -- alert, well-developed, NAD.   HEENT-- Not pale. TMs normal, throat symmetric, no redness or discharge. Face symmetric, sinuses not tender to palpation. Nose congested.  Lungs -- normal respiratory effort, no intercostal retractions, no accessory muscle use, and normal breath sounds.  Heart-- normal rate, regular rhythm, no murmur.   Neurologic--  alert & oriented X3. Speech normal, gait normal, strength normal in all extremities.   Psych-- Cognition and judgment appear intact.  Cooperative with normal attention span and concentration. No anxious or depressed appearing.      Assessment & Plan:

## 2013-05-15 NOTE — Telephone Encounter (Signed)
Spoke with patient when calling to delivery lab results. Patient states that she was seen at the hospital for sinusitis. States that she received Tramadol as treatment and ask for Dr Larose Kells to refill. Advised patient that Tramadol is not common for sinusitis. Per the ED discharge if the patient was not improving she needed to follow up with PCP. Patient is not improving. Scheduled to see Dr Larose Kells today.

## 2013-05-15 NOTE — Assessment & Plan Note (Signed)
seems well-controlled , last BMP show a slightly increased creatinine Plan-- no change, monitor creatinine closely, return to the office in 3 months

## 2013-05-15 NOTE — Progress Notes (Signed)
Pre visit review using our clinic review tool, if applicable. No additional management support is needed unless otherwise documented below in the visit note. 

## 2013-05-15 NOTE — Assessment & Plan Note (Signed)
Ongoing fatigue, denies snoring but feels sleepy. Sleep apnea? After allergies are better consider sleep apnea study

## 2013-05-15 NOTE — Patient Instructions (Addendum)
Take medications as prescribed Also use Claritin 10 mg OTC one tablet daily We'll schedule a CT of the sinuses See Dr Annamaria Boots as schedule Next visit in 3 months for a BP check

## 2013-05-15 NOTE — Assessment & Plan Note (Addendum)
Since the last time she was here, chest x-ray showed ATX versus infiltrate, likely had an  atelectasis. Pt continue with nasal congestion and cough. Allergy referral pending  Plan: Stay on Dymista, add claritin CT sinus

## 2013-05-17 ENCOUNTER — Ambulatory Visit (INDEPENDENT_AMBULATORY_CARE_PROVIDER_SITE_OTHER)
Admission: RE | Admit: 2013-05-17 | Discharge: 2013-05-17 | Disposition: A | Discharge status: Home / Self Care | Payer: Medicare Other | Source: Ambulatory Visit | Attending: Internal Medicine | Admitting: Internal Medicine

## 2013-05-17 DIAGNOSIS — J302 Other seasonal allergic rhinitis: Secondary | ICD-10-CM

## 2013-05-17 DIAGNOSIS — J309 Allergic rhinitis, unspecified: Secondary | ICD-10-CM

## 2013-05-17 DIAGNOSIS — J3089 Other allergic rhinitis: Principal | ICD-10-CM

## 2013-05-17 DIAGNOSIS — J3489 Other specified disorders of nose and nasal sinuses: Secondary | ICD-10-CM | POA: Diagnosis not present

## 2013-05-21 ENCOUNTER — Encounter: Payer: Self-pay | Admitting: *Deleted

## 2013-05-30 ENCOUNTER — Ambulatory Visit: Payer: Medicare Other | Admitting: Internal Medicine

## 2013-06-04 ENCOUNTER — Other Ambulatory Visit: Payer: Self-pay | Admitting: *Deleted

## 2013-06-04 MED ORDER — INSULIN LISPRO 100 UNIT/ML (KWIKPEN): PEN_INJECTOR | SUBCUTANEOUS | Status: DC

## 2013-06-15 ENCOUNTER — Encounter: Payer: Self-pay | Admitting: Internal Medicine

## 2013-06-15 ENCOUNTER — Encounter (INDEPENDENT_AMBULATORY_CARE_PROVIDER_SITE_OTHER): Payer: Self-pay

## 2013-06-15 ENCOUNTER — Ambulatory Visit (INDEPENDENT_AMBULATORY_CARE_PROVIDER_SITE_OTHER): Payer: Medicare Other | Admitting: Internal Medicine

## 2013-06-15 VITALS — BP 146/80 | HR 89 | Ht 64.0 in | Wt 239.0 lb

## 2013-06-15 DIAGNOSIS — J42 Unspecified chronic bronchitis: Secondary | ICD-10-CM | POA: Diagnosis not present

## 2013-06-15 DIAGNOSIS — J3089 Other allergic rhinitis: Principal | ICD-10-CM

## 2013-06-15 DIAGNOSIS — J309 Allergic rhinitis, unspecified: Secondary | ICD-10-CM | POA: Diagnosis not present

## 2013-06-15 DIAGNOSIS — J302 Other seasonal allergic rhinitis: Secondary | ICD-10-CM

## 2013-06-15 MED ORDER — AMOXICILLIN-POT CLAVULANATE 875-125 MG PO TABS: 1.0000 | ORAL_TABLET | Freq: Two times a day (BID) | ORAL | Status: DC

## 2013-06-15 NOTE — Patient Instructions (Addendum)
Ok to continue Dymista nasal spray  Ok to take Claritin/ loratadine as an antihistamine to reduce drainage, sneezing, itching  Ok to add otc decongestant Sudafed-PE to help with stuffiness. You can take this together with the Claritin, if needed  Script for augmentin antibiotic to cover any possible sinus infection- sent

## 2013-06-15 NOTE — Progress Notes (Signed)
Patient ID: Angel Green, female    DOB: 04-15-1940, 73 y.o.   MRN: [id number]  HPI 08/12/10 -17 yoF never smoker, seen at kind request of Dr Larose Kells. Complains that for 3 years she has had excessive phlegm/ congestion in chest and throat. this is present most of the time. Nasal sprays reduce the drainage. Feels that this condition makes her weak and washed out. Symptoms worse in Spring and with weather changes. Seemed better while she was being treated for a cold. Denies dysphagia or reflux but has been told she has GERD. Nasal surgery-? Septoplasty?in past. Allergy skin tested twice with little positive response. Denies asthma, skin rash or relation to meals. Astelin and Flonase used intermittently.  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 Retired from [employer] work. CXR- neg, NAD 06/30/10  09/17/10- 08/12/10 -35 yoF never smoker,  Followed for allergic rhinitis, bronchitis. Complains that for 3 years she has had excessive phlegm/ congestion in chest and throat. Last visit we gave Qvar- big help; allegra- not much help by itself. 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 Lemonade drink mix makes phlegm come.   12/18/10--70 yoF never smoker,  Followed for allergic rhinitis, bronchitis. Complains that for 3 years she has had excessive phlegm/ congestion in chest and throat. Had flu vaccine. No acute problems since last here and feels well. Minor sniffling just in the last day but no cough, chest pain or significant sinus discomfort. She continues Qvar 40.  10/15/11- -70 yoF never smoker,  Followed for allergic rhinitis, bronchitis.  New problem-urticaria/angioedema. Had a viral URI syndrome then woke August 2 with facial and tongue swelling, hard to talk. Involvement especially in left cheek and left side of tongue. ER visit questioned allergy and treated with Benadryl 50 mg plus Pepcid 40 mg which continue, and therefore day course of prednisone. Her URI symptoms have been treated with Mucinex  and cough syrup. Allergy Profile 08/13/2010-total IgE 21.4, negative for specific elevations.  11/30/11- 53 yoF never smoker,  Followed for allergic rhinitis, bronchitis, urticaria/angioedemathese No swelling in tongue or face since last visit Still postnasal drip which she treats with Astelin. Dry cough when she lies down.  05/30/12- 72 yoF never smoker,  Followed for allergic rhinitis, bronchitis. Follows For:  Denies excess swelling at this time - Seasonal allergy complaints - runny nose , itchy eyes, sneezing Likes Dymista nasal spray. Has had no more episodes of angioedema or hives. Off Allegra in the last few days has had increased itching and watery nose. Coughs while lying down. Does not feel reflux while taking Prilosec. CXR-06/16/11  IMPRESSION:  No evidence of acute cardiopulmonary disease.  Original Report Authenticated By: Julian Hy, M.D.  11/29/12- 53 yoF never smoker,  Followed for allergic rhinitis, bronchitis complicated by renal insuff, DM2, HBP FOLLOWS FOR:  Runny nose, itchy and watery eyes- due to season change x 2 days. Likes Dymista- just restarted.  06/15/13- 32 yoF never smoker,  Followed for allergic rhinitis, bronchitis complicated by renal insuff, DM2, HBP FOLLOWS FOR: Pt went to [facility] ER about 3 weeks ago for left sided sinus pain and pressure.  Pt c/o postnasal drip, congestion. Postnasal drip is now clear or light brown after Z-Pak and Dymista nasal spray. CT sinus 05/17/13 IMPRESSION:  No significant sinus disease. Small concha bullosa deformity left  middle nasal turbinate .  Electronically Signed  By: Marcello Moores Register  On: 05/17/2013 14:45 CXR 04/23/13 IMPRESSION:  Limited study by poor inspiration. Elevation of the left  hemidiaphragm. Streaky bilateral basilar atelectasis or infiltrate  left greater than right.  Electronically Signed  By: Lahoma Crocker M.D.  On: 04/18/2013 10:40  Review of Systems-HPI Constitutional:   No-   weight loss, night sweats,  fevers, chills, fatigue, lassitude. HEENT:   No-  headaches, difficulty swallowing, tooth/dental problems, sore throat,       No-sneezing, itching, ear ache, +nasal congestion, post nasal drip,  CV:  No-   chest pain, orthopnea, PND, swelling in lower extremities, anasarca, dizziness, palpitations Resp: No-   shortness of breath with exertion or at rest.             No-  productive cough,  + non-productive cough,  No- coughing up of blood.              No-   change in color of mucus.  No- wheezing.   Skin: No-   rash or lesions. GI:  No-   heartburn, indigestion, abdominal pain, nausea, vomiting,  GU:  MS:  No-   joint pain or swelling.   Neuro-     nothing unusual Psych:  No- change in mood or affect. No depression or anxiety.  No memory loss.   Objective:   Physical Exam  General- Alert, Oriented, Affect-appropriate, Distress- none acute    obese Skin- rash-none, lesions- none, excoriation- none Lymphadenopathy- none Head- atraumatic. No facial asymmetry. Left parotid area seems normal.            Eyes- Gross vision intact, PERRLA, conjunctivae clear secretions            Ears- Hearing, canals- normal            Nose- Clear, No-Septal dev, mucus, polyps, erosion, perforation             Throat- Mallampati II , +posterior pharynx is red and glandular , drainage- none, tonsils- atrophic Neck- flexible , trachea midline, no stridor , thyroid nl, carotid no bruit Chest - symmetrical excursion , unlabored           Heart/CV- RRR , no murmur , no gallop  , no rub, nl s1 s2                           - JVD- none , edema- none, stasis changes- none, varices- none           Lung- clear to P&A, wheeze- none, cough- none , dullness-none, rub- none           Chest wall-  Abd-  Br/ Gen/ Rectal- Not done, not indicated Extrem- cyanosis- none, clubbing, none, atrophy- none, strength- nl Neuro- grossly intact to observation

## 2013-07-04 ENCOUNTER — Other Ambulatory Visit: Payer: Self-pay | Admitting: Internal Medicine

## 2013-07-05 NOTE — Telephone Encounter (Signed)
Rx sent to the pharmacy by e-script.//AB/CMA 

## 2013-07-08 ENCOUNTER — Emergency Department (HOSPITAL_COMMUNITY)
Admission: EM | Admit: 2013-07-08 | Discharge: 2013-07-08 | Disposition: A | Discharge status: Home / Self Care | Payer: Medicare Other | Attending: Emergency Medicine | Admitting: Emergency Medicine

## 2013-07-08 ENCOUNTER — Encounter (HOSPITAL_COMMUNITY): Payer: Self-pay | Admitting: Emergency Medicine

## 2013-07-08 DIAGNOSIS — N189 Chronic kidney disease, unspecified: Secondary | ICD-10-CM | POA: Insufficient documentation

## 2013-07-08 DIAGNOSIS — Z8709 Personal history of other diseases of the respiratory system: Secondary | ICD-10-CM | POA: Diagnosis not present

## 2013-07-08 DIAGNOSIS — I129 Hypertensive chronic kidney disease with stage 1 through stage 4 chronic kidney disease, or unspecified chronic kidney disease: Secondary | ICD-10-CM | POA: Insufficient documentation

## 2013-07-08 DIAGNOSIS — E785 Hyperlipidemia, unspecified: Secondary | ICD-10-CM | POA: Insufficient documentation

## 2013-07-08 DIAGNOSIS — Z79899 Other long term (current) drug therapy: Secondary | ICD-10-CM | POA: Insufficient documentation

## 2013-07-08 DIAGNOSIS — M1990 Unspecified osteoarthritis, unspecified site: Secondary | ICD-10-CM | POA: Insufficient documentation

## 2013-07-08 DIAGNOSIS — N390 Urinary tract infection, site not specified: Secondary | ICD-10-CM | POA: Insufficient documentation

## 2013-07-08 DIAGNOSIS — I10 Essential (primary) hypertension: Secondary | ICD-10-CM | POA: Diagnosis not present

## 2013-07-08 DIAGNOSIS — Z794 Long term (current) use of insulin: Secondary | ICD-10-CM | POA: Insufficient documentation

## 2013-07-08 DIAGNOSIS — Z7982 Long term (current) use of aspirin: Secondary | ICD-10-CM | POA: Insufficient documentation

## 2013-07-08 DIAGNOSIS — E119 Type 2 diabetes mellitus without complications: Secondary | ICD-10-CM | POA: Diagnosis not present

## 2013-07-08 LAB — URINALYSIS, ROUTINE W REFLEX MICROSCOPIC
GLUCOSE, UA: NEGATIVE mg/dL
KETONES UR: 15 mg/dL — AB
Nitrite: POSITIVE — AB
PROTEIN: 100 mg/dL — AB
Specific Gravity, Urine: 1.026 (ref 1.005–1.030)
Urobilinogen, UA: 1 mg/dL (ref 0.0–1.0)
pH: 5.5 (ref 5.0–8.0)

## 2013-07-08 LAB — URINE MICROSCOPIC-ADD ON

## 2013-07-08 MED ORDER — CEFTRIAXONE SODIUM 1 G IJ SOLR
1.0000 g | Freq: Once | INTRAMUSCULAR | Status: AC
  Administered 2013-07-08: 1 g via INTRAMUSCULAR
  Filled 2013-07-08: qty 10

## 2013-07-08 MED ORDER — LIDOCAINE HCL (PF) 1 % IJ SOLN
INTRAMUSCULAR | Status: AC
  Administered 2013-07-08: 5 mL
  Filled 2013-07-08: qty 5

## 2013-07-08 MED ORDER — PHENAZOPYRIDINE HCL 200 MG PO TABS: 200.0000 mg | ORAL_TABLET | Freq: Three times a day (TID) | ORAL | Status: DC | PRN

## 2013-07-08 MED ORDER — CEPHALEXIN 500 MG PO CAPS: 500.0000 mg | ORAL_CAPSULE | Freq: Four times a day (QID) | ORAL | Status: DC

## 2013-07-08 NOTE — ED Provider Notes (Signed)
CSN: [id number]     Arrival date & time 07/08/13  1515 History   First MD Initiated Contact with Patient 07/08/13 1717     Chief Complaint  Patient presents with  . Urinary Tract Infection     (Consider location/radiation/quality/duration/timing/severity/associated sxs/prior Treatment) HPI Comments: Patient presents to the ER for evaluation of urinary frequency, dysuria and spotting of blood when she went after urination. Symptoms began yesterday. No continuous abdominal pain. No back pain. She denies fever, nausea and vomiting.  Patient is a 73 y.o. female presenting with urinary tract infection.  Urinary Tract Infection    Past Medical History  Diagnosis Date  . Hypertension   . Diabetes mellitus, type 2   . Hyperlipidemia   . Osteoarthritis   . Ankle pain     chronic  . Rhinitis     allergic nos  . RENAL INSUFFICIENCY, CHRONIC 04/16/2010   Past Surgical History  Procedure Laterality Date  . Cholecystectomy    . Partial hysterectomy      in her 45s  . Dental implants     Family History  Problem Relation Age of Onset  . Heart attack Neg Hx   . Colon cancer Neg Hx   . Breast cancer Neg Hx    History  Substance Use Topics  . Smoking status: Never Smoker   . Smokeless tobacco: Never Used  . Alcohol Use: No   OB History   Grav Para Term Preterm Abortions TAB SAB Ect Mult Living                 Review of Systems  Genitourinary: Positive for dysuria, frequency and hematuria.  All other systems reviewed and are negative.     Allergies  Review of patient's allergies indicates no known allergies.  Home Medications   Prior to Admission medications   Medication Sig Start Date End Date Taking? Authorizing Provider  amLODipine (NORVASC) 5 MG tablet Take 1 tablet (5 mg total) by mouth daily. 12/15/12  Yes Colon Branch, MD  aspirin 81 MG tablet Take 81 mg by mouth daily.     Yes Historical Provider, MD  atorvastatin (LIPITOR) 10 MG tablet Take 10 mg by mouth daily  at 6 PM.   Yes Historical Provider, MD  Azelastine-Fluticasone (DYMISTA) 137-50 MCG/ACT SUSP Place 1-2 sprays into the nose daily.   Yes Historical Provider, MD  beclomethasone (QVAR) 40 MCG/ACT inhaler Inhale 2 puffs into the lungs 2 (two) times daily. Rinse mouth 03/14/12 07/08/13 Yes Clinton D Young, MD  Cyanocobalamin (B-12 PO) Take 1 tablet by mouth daily.    Yes Historical Provider, MD  Insulin Glargine (LANTUS SOLOSTAR [place]) Inject 15 Units into the skin at bedtime.    Yes Historical Provider, MD  insulin lispro (HUMALOG KWIKPEN) 100 UNIT/ML KiwkPen INJECT THREE TIMES A DAY JUST BEFORE EACH MEAL, 5 UNITS BEFORE BREAKFAST, 10 UNITS BEFORE LUNCH AND 15 UNITS BEFORE DINNER. 06/04/13  Yes Colon Branch, MD  loratadine (CLARITIN) 10 MG tablet Take 10 mg by mouth daily.   Yes Historical Provider, MD  losartan (COZAAR) 50 MG tablet Take 1 tablet (50 mg total) by mouth daily. 04/18/13  Yes Colon Branch, MD  Multiple Vitamins-Minerals (CENTRUM SILVER PO) Take 1 tablet by mouth daily.     Yes Historical Provider, MD  omeprazole (PRILOSEC) 20 MG capsule TAKE 1 CAPSULE DAILY   Yes Colon Branch, MD  Polyethyl Glycol-Propyl Glycol (SYSTANE) 0.4-0.3 % SOLN Apply 1 drop to eye daily as  needed (for dry eyes).   Yes Historical Provider, MD   BP 148/78  Pulse 88  Temp(Src) 97.8 F (36.6 C) (Oral)  Resp 18  Ht 5' 4.75" (1.645 m)  Wt 235 lb (106.595 kg)  BMI 39.39 kg/m2  SpO2 98% Physical Exam  Constitutional: She is oriented to person, place, and time. She appears well-developed and well-nourished. No distress.  HENT:  Head: Normocephalic and atraumatic.  Right Ear: Hearing normal.  Left Ear: Hearing normal.  Nose: Nose normal.  Mouth/Throat: Oropharynx is clear and moist and mucous membranes are normal.  Eyes: Conjunctivae and EOM are normal. Pupils are equal, round, and reactive to light.  Neck: Normal range of motion. Neck supple.  Cardiovascular: Regular rhythm, S1 normal and S2 normal.  Exam reveals no  gallop and no friction rub.   No murmur heard. Pulmonary/Chest: Effort normal and breath sounds normal. No respiratory distress. She exhibits no tenderness.  Abdominal: Soft. Normal appearance and bowel sounds are normal. There is no hepatosplenomegaly. There is no tenderness. There is no rebound, no guarding, no tenderness at McBurney's point and negative Murphy's sign. No hernia.  Musculoskeletal: Normal range of motion.  Neurological: She is alert and oriented to person, place, and time. She has normal strength. No cranial nerve deficit or sensory deficit. Coordination normal. GCS eye subscore is 4. GCS verbal subscore is 5. GCS motor subscore is 6.  Skin: Skin is warm, dry and intact. No rash noted. No cyanosis.  Psychiatric: She has a normal mood and affect. Her speech is normal and behavior is normal. Thought content normal.    ED Course  Procedures (including critical care time) Labs Review Labs Reviewed  URINALYSIS, ROUTINE W REFLEX MICROSCOPIC - Abnormal; Notable for the following:    Color, Urine AMBER (*)    APPearance CLOUDY (*)    Hgb urine dipstick LARGE (*)    Bilirubin Urine SMALL (*)    Ketones, ur 15 (*)    Protein, ur 100 (*)    Nitrite POSITIVE (*)    Leukocytes, UA MODERATE (*)    All other components within normal limits  URINE MICROSCOPIC-ADD ON - Abnormal; Notable for the following:    Bacteria, UA MANY (*)    All other components within normal limits  URINE CULTURE    Imaging Review No results found.   EKG Interpretation None      MDM   Final diagnoses:  UTI (lower urinary tract infection)   Presented to the ER for evaluation of urinary frequency, urgency, dysuria and hematuria. Urinalysis is consistent with infection. Patient to be treated with empiric antibiotic coverage, cultures sent.    Orpah Greek, MD 07/11/13 0730

## 2013-07-08 NOTE — Discharge Instructions (Signed)
Urinary Tract Infection  Urinary tract infections (UTIs) can develop anywhere along your urinary tract. Your urinary tract is your body's drainage system for removing wastes and extra water. Your urinary tract includes two kidneys, two ureters, a bladder, and a urethra. Your kidneys are a pair of bean-shaped organs. Each kidney is about the size of your fist. They are located below your ribs, one on each side of your spine.  CAUSES  Infections are caused by microbes, which are microscopic organisms, including fungi, viruses, and bacteria. These organisms are so small that they can only be seen through a microscope. Bacteria are the microbes that most commonly cause UTIs.  SYMPTOMS   Symptoms of UTIs may vary by age and gender of the patient and by the location of the infection. Symptoms in young women typically include a frequent and intense urge to urinate and a painful, burning feeling in the bladder or urethra during urination. Older women and men are more likely to be tired, shaky, and weak and have muscle aches and abdominal pain. A fever may mean the infection is in your kidneys. Other symptoms of a kidney infection include pain in your back or sides below the ribs, nausea, and vomiting.  DIAGNOSIS  To diagnose a UTI, your caregiver will ask you about your symptoms. Your caregiver also will ask to provide a urine sample. The urine sample will be tested for bacteria and white blood cells. White blood cells are made by your body to help fight infection.  TREATMENT   Typically, UTIs can be treated with medication. Because most UTIs are caused by a bacterial infection, they usually can be treated with the use of antibiotics. The choice of antibiotic and length of treatment depend on your symptoms and the type of bacteria causing your infection.  HOME CARE INSTRUCTIONS   If you were prescribed antibiotics, take them exactly as your caregiver instructs you. Finish the medication even if you feel better after you  have only taken some of the medication.   Drink enough water and fluids to keep your urine clear or pale yellow.   Avoid caffeine, tea, and carbonated beverages. They tend to irritate your bladder.   Empty your bladder often. Avoid holding urine for long periods of time.   Empty your bladder before and after sexual intercourse.   After a bowel movement, women should cleanse from front to back. Use each tissue only once.  SEEK MEDICAL CARE IF:    You have back pain.   You develop a fever.   Your symptoms do not begin to resolve within 3 days.  SEEK IMMEDIATE MEDICAL CARE IF:    You have severe back pain or lower abdominal pain.   You develop chills.   You have nausea or vomiting.   You have continued burning or discomfort with urination.  MAKE SURE YOU:    Understand these instructions.   Will watch your condition.   Will get help right away if you are not doing well or get worse.  Document Released: 11/18/2004 Document Revised: 08/10/2011 Document Reviewed: 03/19/2011  [employer] Patient Information 2014 [employer]

## 2013-07-08 NOTE — ED Notes (Signed)
Pt c/o burning with urination and light spots of blood onset yesterday.

## 2013-07-10 ENCOUNTER — Other Ambulatory Visit: Payer: Self-pay | Admitting: Internal Medicine

## 2013-07-10 LAB — URINE CULTURE

## 2013-07-12 ENCOUNTER — Telehealth (HOSPITAL_BASED_OUTPATIENT_CLINIC_OR_DEPARTMENT_OTHER): Payer: Self-pay | Admitting: Emergency Medicine

## 2013-07-12 NOTE — Telephone Encounter (Signed)
Post ED Visit - Positive Culture Follow-up  Culture report reviewed by antimicrobial stewardship pharmacist: [x]  Wes Dulaney, Pharm.D., BCPS []  Heide Guile, Pharm.D., BCPS []  Alycia Rossetti, Pharm.D., BCPS []  [place], [place].D., BCPS, AAHIVP []  Legrand Como, Pharm.D., BCPS, AAHIVP []  Juliene Pina, Pharm.D.  Positive urine culture Treated with Keflex, organism sensitive to the same and no further patient follow-up is required at this time.  Angel Green 07/12/2013, 10:24 AM

## 2013-07-13 ENCOUNTER — Encounter: Payer: Self-pay | Admitting: Endocrinology

## 2013-07-13 ENCOUNTER — Encounter: Payer: Self-pay | Admitting: Internal Medicine

## 2013-07-13 ENCOUNTER — Ambulatory Visit (INDEPENDENT_AMBULATORY_CARE_PROVIDER_SITE_OTHER): Payer: Medicare Other | Admitting: Endocrinology

## 2013-07-13 VITALS — BP 124/80 | HR 105 | Temp 97.9°F | Ht 64.75 in | Wt 237.0 lb

## 2013-07-13 DIAGNOSIS — E119 Type 2 diabetes mellitus without complications: Secondary | ICD-10-CM

## 2013-07-13 LAB — HEMOGLOBIN A1C: Hgb A1c MFr Bld: 8.2 % — ABNORMAL HIGH (ref 4.6–6.5)

## 2013-07-13 LAB — MICROALBUMIN / CREATININE URINE RATIO
CREATININE, U: 50.4 mg/dL
MICROALB/CREAT RATIO: 1.4 mg/g (ref 0.0–30.0)
Microalb, Ur: 0.7 mg/dL (ref 0.0–1.9)

## 2013-07-13 NOTE — Patient Instructions (Signed)
Please increase the lunch insulin to 15 units. check your blood sugar 2 times a day.  vary the time of day when you check, between before the 3 meals, and at bedtime.  also check if you have symptoms of your blood sugar being too high or too low.  please keep a record of the readings and bring it to your next appointment here.  please call us sooner if your blood sugar goes below 70, or if you have a lot of readings over 200.   blood tests are being requested for you today.  We'll contact you with results.  Please come back for a follow-up appointment in 3 months.

## 2013-07-13 NOTE — Assessment & Plan Note (Signed)
Recent upper airway inflammation has not involved the lower respiratory tract yet

## 2013-07-13 NOTE — Assessment & Plan Note (Signed)
Recent exacerbation of rhinitis/sinusitis was probably infection. Plan-Augmentin, Claritin

## 2013-07-13 NOTE — Progress Notes (Signed)
Subjective:    Patient ID: Angel Green, female    DOB: Jul 17, 1940, 73 y.o.   MRN: [id number]  HPI pt returns for f/u of insulin-requiring DM (dx'ed 1999, on a routine blood test; she has moderate neuropathy of the lower extremities and associated renal insufficiency; she has been on insulin since 2010; she takes multiple daily injections; she has never had severe hypoglycemia or DKA; therapy has been limited by variable cbg's).  she brings a record of her cbg's which i have reviewed today.  It varies from 74-150.  It is in general slightly higher as the day goes on.   Past Medical History  Diagnosis Date  . Hypertension   . Diabetes mellitus, type 2   . Hyperlipidemia   . Osteoarthritis   . Ankle pain     chronic  . Rhinitis     allergic nos  . RENAL INSUFFICIENCY, CHRONIC 04/16/2010    Past Surgical History  Procedure Laterality Date  . Cholecystectomy    . Partial hysterectomy      in her 3s  . Dental implants      History   Social History  . Marital Status: Married    Spouse Name: N/A    Number of Children: 2  . Years of Education: N/A   Occupational History  .  retired    Social History Main Topics  . Smoking status: Never Smoker   . Smokeless tobacco: Never Used  . Alcohol Use: No  . Drug Use: No  . Sexual Activity: Not on file   Other Topics Concern  . Not on file   Social History Narrative    husband is a Company secretary                Current Outpatient Prescriptions on File Prior to Visit  Medication Sig Dispense Refill  . amLODipine (NORVASC) 5 MG tablet Take 1 tablet (5 mg total) by mouth daily.  90 tablet  3  . aspirin 81 MG tablet Take 81 mg by mouth daily.        Marland Kitchen atorvastatin (LIPITOR) 10 MG tablet Take 10 mg by mouth daily at 6 PM.      . Azelastine-Fluticasone (DYMISTA) 137-50 MCG/ACT SUSP Place 1-2 sprays into the nose daily.      . cephALEXin (KEFLEX) 500 MG capsule Take 1 capsule (500 mg total) by mouth 4 (four) times daily.  40 capsule   0  . Cyanocobalamin (B-12 PO) Take 1 tablet by mouth daily.       . furosemide (LASIX) 20 MG tablet Take 40 mg by mouth daily.      . Insulin Glargine (LANTUS SOLOSTAR [place]) Inject 15 Units into the skin at bedtime.       . insulin lispro (HUMALOG) 100 UNIT/ML injection 5 units with breakfast, 15 units with lunch, 20 units with evening meal      . loratadine (CLARITIN) 10 MG tablet Take 10 mg by mouth daily.      Marland Kitchen losartan (COZAAR) 50 MG tablet Take 1 tablet (50 mg total) by mouth daily.  30 tablet  1  . Multiple Vitamins-Minerals (CENTRUM SILVER PO) Take 1 tablet by mouth daily.        Marland Kitchen omeprazole (PRILOSEC) 20 MG capsule Take 20 mg by mouth daily.      . phenazopyridine (PYRIDIUM) 200 MG tablet Take 1 tablet (200 mg total) by mouth 3 (three) times daily as needed for pain.  6 tablet  0  .  Polyethyl Glycol-Propyl Glycol (SYSTANE) 0.4-0.3 % SOLN Apply 1 drop to eye daily as needed (for dry eyes).      Marland Kitchen QVAR 40 MCG/ACT inhaler INHALE 2 PUFFS TWICE A DAY, RINSE MOUTH  3 g  5   No current facility-administered medications on file prior to visit.    No Known Allergies  Family History  Problem Relation Age of Onset  . Heart attack Neg Hx   . Colon cancer Neg Hx   . Breast cancer Neg Hx     BP 124/80  Pulse 105  Temp(Src) 97.9 F (36.6 C) (Oral)  Ht 5' 4.75" (1.645 m)  Wt 237 lb (107.502 kg)  BMI 39.73 kg/m2  SpO2 95%   Review of Systems She denies hypoglycemia.  She has weight gain.     Objective:   Physical Exam VITAL SIGNS:  See vs page GENERAL: no distress Pulses: dorsalis pedis intact bilat.   Feet: no deformity. normal color and temp.  no edema Skin:  no ulcer on the feet.   Neuro: sensation is intact to touch on the feet   Lab Results  Component Value Date   HGBA1C 8.2* 07/13/2013       Assessment & Plan:  DM: moderate exacerbation: she needs increased rx.   Renal insuff: this increases the risk of hypoglycemia.  I'll work around this as best I can.      Patient Instructions  Please increase the lunch insulin to 15 units. check your blood sugar 2 times a day.  vary the time of day when you check, between before the 3 meals, and at bedtime.  also check if you have symptoms of your blood sugar being too high or too low.  please keep a record of the readings and bring it to your next appointment here.  please call us sooner if your blood sugar goes below 70, or if you have a lot of readings over 200.   blood tests are being requested for you today.  We'll contact you with results.  Please come back for a follow-up appointment in 3 months.

## 2013-07-19 ENCOUNTER — Other Ambulatory Visit: Payer: Self-pay | Admitting: Internal Medicine

## 2013-07-19 MED ORDER — FLUTICASONE PROPIONATE HFA 44 MCG/ACT IN AERO: 2.0000 | INHALATION_SPRAY | Freq: Two times a day (BID) | RESPIRATORY_TRACT | Status: DC

## 2013-09-03 ENCOUNTER — Ambulatory Visit (INDEPENDENT_AMBULATORY_CARE_PROVIDER_SITE_OTHER): Payer: Medicare Other | Admitting: Internal Medicine

## 2013-09-03 ENCOUNTER — Encounter: Payer: Self-pay | Admitting: Internal Medicine

## 2013-09-03 VITALS — BP 142/80 | HR 89 | Temp 98.0°F | Wt 235.0 lb

## 2013-09-03 DIAGNOSIS — I10 Essential (primary) hypertension: Secondary | ICD-10-CM | POA: Diagnosis not present

## 2013-09-03 DIAGNOSIS — E1140 Type 2 diabetes mellitus with diabetic neuropathy, unspecified: Secondary | ICD-10-CM

## 2013-09-03 DIAGNOSIS — E1142 Type 2 diabetes mellitus with diabetic polyneuropathy: Secondary | ICD-10-CM | POA: Diagnosis not present

## 2013-09-03 DIAGNOSIS — E1149 Type 2 diabetes mellitus with other diabetic neurological complication: Secondary | ICD-10-CM | POA: Diagnosis not present

## 2013-09-03 LAB — BASIC METABOLIC PANEL
BUN: 13 mg/dL (ref 6–23)
CO2: 27 meq/L (ref 19–32)
Calcium: 9 mg/dL (ref 8.4–10.5)
Chloride: 107 mEq/L (ref 96–112)
Creatinine, Ser: 1.3 mg/dL — ABNORMAL HIGH (ref 0.4–1.2)
GFR: 52.22 mL/min — ABNORMAL LOW (ref >60.00)
Glucose, Bld: 108 mg/dL — ABNORMAL HIGH (ref 70–99)
Potassium: 4 mEq/L (ref 3.5–5.1)
SODIUM: 140 meq/L (ref 135–145)

## 2013-09-03 LAB — MAGNESIUM: MAGNESIUM: 2.1 mg/dL (ref 1.5–2.5)

## 2013-09-03 NOTE — Assessment & Plan Note (Addendum)
Sees Dr. Loanne Drilling. Concerned about not being able to loose wt despite  a healthy diet . Unable to exercise per se but she remains active. Plan: Keep next followup with Dr. Loanne Drilling, refer to a nutritionist (Mrs Spagnola)

## 2013-09-03 NOTE — Progress Notes (Signed)
Pre visit review using our clinic review tool, if applicable. No additional management support is needed unless otherwise documented below in the visit note. 

## 2013-09-03 NOTE — Progress Notes (Signed)
Subjective:    Patient ID: Angel Green, female    DOB: 04-07-1940, 73 y.o.   MRN: [id number]  DOS:  09/03/2013 Type of visit - description:  Routine  History: In general feels well  Med list reviewed, good compliance  She is trying to eat healthy and remains active without routine exercise, concern because she can't lose weight. Ambulatory SBPs 112 on average.    ROS Denies chest pain, difficulty breathing or lower extremity edema No nausea, vomiting, diarrhea. Occasional leg cramps   Past Medical History  Diagnosis Date  . Hypertension   . Diabetes mellitus, type 2   . Hyperlipidemia   . Osteoarthritis   . Ankle pain     chronic  . Rhinitis     allergic nos  . RENAL INSUFFICIENCY, CHRONIC 04/16/2010    Past Surgical History  Procedure Laterality Date  . Cholecystectomy    . Partial hysterectomy      in her 42s  . Dental implants      History   Social History  . Marital Status: Married    Spouse Name: N/A    Number of Children: 2  . Years of Education: N/A   Occupational History  .  retired    Social History Main Topics  . Smoking status: Never Smoker   . Smokeless tobacco: Never Used  . Alcohol Use: No  . Drug Use: No  . Sexual Activity: Not on file   Other Topics Concern  . Not on file   Social History Narrative    husband is a Company secretary                    Medication List       This list is accurate as of: 09/03/13 12:54 PM.  Always use your most recent med list.               amLODipine 5 MG tablet  Commonly known as:  NORVASC  Take 1 tablet (5 mg total) by mouth daily.     aspirin 81 MG tablet  Take 81 mg by mouth daily.     atorvastatin 10 MG tablet  Commonly known as:  LIPITOR  Take 10 mg by mouth daily at 6 PM.     B-12 PO  Take 1 tablet by mouth daily.     CENTRUM SILVER PO  Take 1 tablet by mouth daily.     DYMISTA 137-50 MCG/ACT Susp  Generic drug:  Azelastine-Fluticasone  Place 1-2 sprays into the nose  daily.     fluticasone 44 MCG/ACT inhaler  Commonly known as:  FLOVENT HFA  Inhale 2 puffs into the lungs 2 (two) times daily.     furosemide 20 MG tablet  Commonly known as:  LASIX  Take 40 mg by mouth daily.     insulin lispro 100 UNIT/ML injection  Commonly known as:  HUMALOG  5 units with breakfast, 15 units with lunch, 20 units with evening meal     LANTUS SOLOSTAR [place]  Inject 15 Units into the skin at bedtime.     loratadine 10 MG tablet  Commonly known as:  CLARITIN  Take 10 mg by mouth daily.     losartan 50 MG tablet  Commonly known as:  COZAAR  Take 1 tablet (50 mg total) by mouth daily.     omeprazole 20 MG capsule  Commonly known as:  PRILOSEC  Take 20 mg by mouth daily.     SYSTANE  0.4-0.3 % Soln  Generic drug:  Polyethyl Glycol-Propyl Glycol  Apply 1 drop to eye daily as needed (for dry eyes).           Objective:   Physical Exam BP 142/80  Pulse 89  Temp(Src) 98 F (36.7 C)  Wt 235 lb (106.595 kg)  SpO2 100%  General -- alert, well-developed, NAD.   Lungs -- normal respiratory effort, no intercostal retractions, no accessory muscle use, and normal breath sounds.  Heart-- normal rate, regular rhythm, no murmur.   Extremities-- no pretibial edema bilaterally  Neurologic--  alert & oriented X3. Speech normal, gait appropriate for age, strength symmetric and appropriate for age.  Psych-- Cognition and judgment appear intact. Cooperative with normal attention span and concentration. No anxious or depressed appearing.        Assessment & Plan:

## 2013-09-03 NOTE — Patient Instructions (Signed)
Get your blood work before you leave   Next visit is for a physical exam by 11-2013 , fasting Please make an appointment     At some point this year the clinic will relocate to  [place] and [address] (10 minutes form here)  [place]  [place], [place] [address] [phone]

## 2013-09-03 NOTE — Assessment & Plan Note (Signed)
Seems well controlled, check a BMP, continue with present care. Also check a magnesium level as she has leg cramps and is on diuretics.

## 2013-09-13 ENCOUNTER — Other Ambulatory Visit: Payer: Self-pay | Admitting: Internal Medicine

## 2013-09-26 ENCOUNTER — Telehealth: Payer: Self-pay | Admitting: Endocrinology

## 2013-09-26 ENCOUNTER — Other Ambulatory Visit: Payer: Self-pay

## 2013-09-26 ENCOUNTER — Telehealth: Payer: Self-pay

## 2013-09-26 MED ORDER — INSULIN GLARGINE 100 UNIT/ML SOLOSTAR PEN: PEN_INJECTOR | SUBCUTANEOUS | Status: DC

## 2013-09-26 NOTE — Telephone Encounter (Signed)
Patient called to ask for refills on Solostar. Advised to contact Dr Loanne Drilling due to do there office managing her DM. Patient agrees with plan.

## 2013-10-07 ENCOUNTER — Other Ambulatory Visit: Payer: Self-pay | Admitting: Internal Medicine

## 2013-10-12 ENCOUNTER — Ambulatory Visit (INDEPENDENT_AMBULATORY_CARE_PROVIDER_SITE_OTHER): Payer: Medicare Other | Admitting: Endocrinology

## 2013-10-12 ENCOUNTER — Encounter: Payer: Self-pay | Admitting: Endocrinology

## 2013-10-12 VITALS — BP 120/66 | HR 100 | Temp 98.9°F | Ht 64.75 in | Wt 231.0 lb

## 2013-10-12 DIAGNOSIS — L97909 Non-pressure chronic ulcer of unspecified part of unspecified lower leg with unspecified severity: Secondary | ICD-10-CM

## 2013-10-12 DIAGNOSIS — L97921 Non-pressure chronic ulcer of unspecified part of left lower leg limited to breakdown of skin: Secondary | ICD-10-CM

## 2013-10-12 DIAGNOSIS — E1149 Type 2 diabetes mellitus with other diabetic neurological complication: Secondary | ICD-10-CM | POA: Diagnosis not present

## 2013-10-12 DIAGNOSIS — E1142 Type 2 diabetes mellitus with diabetic polyneuropathy: Secondary | ICD-10-CM | POA: Diagnosis not present

## 2013-10-12 DIAGNOSIS — E1140 Type 2 diabetes mellitus with diabetic neuropathy, unspecified: Secondary | ICD-10-CM

## 2013-10-12 DIAGNOSIS — L97929 Non-pressure chronic ulcer of unspecified part of left lower leg with unspecified severity: Secondary | ICD-10-CM | POA: Insufficient documentation

## 2013-10-12 NOTE — Progress Notes (Signed)
Subjective:    Patient ID: Angel Green, female    DOB: 1940-12-02, 73 y.o.   MRN: [id number]  HPI pt returns for f/u of insulin-requiring DM (dx'ed 1999, on a routine blood test; she has moderate neuropathy of the lower extremities, and renal insufficiency; she has been on insulin since 2010; she takes multiple daily injections; she has never had severe hypoglycemia or DKA; therapy has been limited by variable cbg's).  she brings a record of her cbg's which i have reviewed today.  It varies from 96-200, but most are in the 100's.  It is lowest in the afternoon.  pt states she feels well in general, except for fatigue.   Pt also states 2 years of moderate pain at the left knee, but no assoc numbness. Past Medical History  Diagnosis Date  . Hypertension   . Diabetes mellitus, type 2   . Hyperlipidemia   . Osteoarthritis   . Ankle pain     chronic  . Rhinitis     allergic nos  . RENAL INSUFFICIENCY, CHRONIC 04/16/2010    Past Surgical History  Procedure Laterality Date  . Cholecystectomy    . Partial hysterectomy      in her 21s  . Dental implants      History   Social History  . Marital Status: Married    Spouse Name: N/A    Number of Children: 2  . Years of Education: N/A   Occupational History  .  retired    Social History Main Topics  . Smoking status: Never Smoker   . Smokeless tobacco: Never Used  . Alcohol Use: No  . Drug Use: No  . Sexual Activity: Not on file   Other Topics Concern  . Not on file   Social History Narrative    husband is a Company secretary                Current Outpatient Prescriptions on File Prior to Visit  Medication Sig Dispense Refill  . amLODipine (NORVASC) 5 MG tablet Take 1 tablet (5 mg total) by mouth daily.  90 tablet  3  . aspirin 81 MG tablet Take 81 mg by mouth daily.        Marland Kitchen atorvastatin (LIPITOR) 10 MG tablet Take 10 mg by mouth daily at 6 PM.      . atorvastatin (LIPITOR) 10 MG tablet TAKE 1 TABLET DAILY  90 tablet  0    . Azelastine-Fluticasone (DYMISTA) 137-50 MCG/ACT SUSP Place 1-2 sprays into the nose daily.      . Cyanocobalamin (B-12 PO) Take 1 tablet by mouth daily.       . fluticasone (FLOVENT HFA) 44 MCG/ACT inhaler Inhale 2 puffs into the lungs 2 (two) times daily.  3 Inhaler  3  . furosemide (LASIX) 20 MG tablet Take 40 mg by mouth daily.      . furosemide (LASIX) 20 MG tablet TAKE 2 TABLETS DAILY  180 tablet  0  . Insulin Glargine (LANTUS SOLOSTAR) 100 UNIT/ML Sol[place] Pen Inject 15 units into the skin at bedtime.  15 mL  1  . insulin lispro (HUMALOG) 100 UNIT/ML injection 5 units with breakfast, 15 units with lunch, 20 units with evening meal      . loratadine (CLARITIN) 10 MG tablet Take 10 mg by mouth daily.      Marland Kitchen losartan (COZAAR) 50 MG tablet Take 1 tablet (50 mg total) by mouth daily.  30 tablet  1  . Multiple  Vitamins-Minerals (CENTRUM SILVER PO) Take 1 tablet by mouth daily.        Marland Kitchen omeprazole (PRILOSEC) 20 MG capsule Take 20 mg by mouth daily.      Marland Kitchen omeprazole (PRILOSEC) 20 MG capsule TAKE 1 CAPSULE DAILY  90 capsule  0  . Polyethyl Glycol-Propyl Glycol (SYSTANE) 0.4-0.3 % SOLN Apply 1 drop to eye daily as needed (for dry eyes).      . ramipril (ALTACE) 5 MG capsule TAKE 1 CAPSULE DAILY  90 capsule  1   No current facility-administered medications on file prior to visit.    No Known Allergies  Family History  Problem Relation Age of Onset  . Heart attack Neg Hx   . Colon cancer Neg Hx   . Breast cancer Neg Hx     BP 120/66  Pulse 100  Temp(Src) 98.9 F (37.2 C) (Oral)  Ht 5' 4.75" (1.645 m)  Wt 231 lb (104.781 kg)  BMI 38.72 kg/m2  SpO2 98%    Review of Systems She reports weight gain, despite good diet, and wants medication for this.  She denies hypoglycemia.      Objective:   Physical Exam VITAL SIGNS:  See vs page GENERAL: no distress Pulses: dorsalis pedis intact bilat.   Feet: no deformity. normal color and temp.  no edema Skin:  no ulcer on the feet.    Neuro: sensation is intact to touch on the feet Left knee, medial aspect: 3x1 cm shallow ulcer, with 1 cm rim of erythema.  No drainage    Lab Results  Component Value Date   HGBA1C 8.3* 10/12/2013      Assessment & Plan:  Leg ulcer, new. DM: moderate exacerbation.  She needs a simpler regimen.  Patient is advised the following: Patient Instructions  check your blood sugar 2 times a day.  vary the time of day when you check, between before the 3 meals, and at bedtime.  also check if you have symptoms of your blood sugar being too high or too low.  please keep a record of the readings and bring it to your next appointment here.  please call us sooner if your blood sugar goes below 70, or if you have a lot of readings over 200.   A diabetes blood test is requested for you today.  We'll contact you with results.  Please come back for a follow-up appointment in 3 months.   If you wish, we change to a twice a day insulin.  Please let me know.   Please see a wound-care specialist.  you will receive a phone call, about a day and time for an appointment. Until you see them, please keep it covered with antibiotic ointment and a large bandaid.  addendum: Change to 75/25.  i have sent a prescription to your pharmacy

## 2013-10-12 NOTE — Patient Instructions (Addendum)
check your blood sugar 2 times a day.  vary the time of day when you check, between before the 3 meals, and at bedtime.  also check if you have symptoms of your blood sugar being too high or too low.  please keep a record of the readings and bring it to your next appointment here.  please call us sooner if your blood sugar goes below 70, or if you have a lot of readings over 200.   A diabetes blood test is requested for you today.  We'll contact you with results.  Please come back for a follow-up appointment in 3 months.   If you wish, we change to a twice a day insulin.  Please let me know.   Please see a wound-care specialist.  you will receive a phone call, about a day and time for an appointment. Until you see them, please keep it covered with antibiotic ointment and a large bandaid.

## 2013-10-13 LAB — HEMOGLOBIN A1C: Hgb A1c MFr Bld: 8.3 % — ABNORMAL HIGH (ref 4.6–6.5)

## 2013-10-14 MED ORDER — INSULIN LISPRO PROT & LISPRO (75-25 MIX) 100 UNIT/ML KWIKPEN: PEN_INJECTOR | SUBCUTANEOUS | Status: DC

## 2013-10-17 ENCOUNTER — Encounter: Payer: Self-pay | Admitting: *Deleted

## 2013-10-17 ENCOUNTER — Encounter: Payer: Medicare Other | Attending: Internal Medicine | Admitting: *Deleted

## 2013-10-17 VITALS — Ht 63.75 in | Wt 231.6 lb

## 2013-10-17 DIAGNOSIS — E1149 Type 2 diabetes mellitus with other diabetic neurological complication: Secondary | ICD-10-CM | POA: Insufficient documentation

## 2013-10-17 DIAGNOSIS — Z794 Long term (current) use of insulin: Secondary | ICD-10-CM | POA: Diagnosis not present

## 2013-10-17 DIAGNOSIS — Z713 Dietary counseling and surveillance: Secondary | ICD-10-CM | POA: Diagnosis not present

## 2013-10-17 DIAGNOSIS — E1142 Type 2 diabetes mellitus with diabetic polyneuropathy: Secondary | ICD-10-CM | POA: Insufficient documentation

## 2013-10-17 DIAGNOSIS — E1140 Type 2 diabetes mellitus with diabetic neuropathy, unspecified: Secondary | ICD-10-CM

## 2013-10-17 NOTE — Progress Notes (Signed)
Appt start time: 0830 end time:  1000.  Assessment:  Patient was seen on  10/17/13 for individual diabetes education. History of diabetes for over 20 years. Lives with her husband, they share the shopping and the cooking of meals. SMBG 4 times a day before each meal and bedtime. Reported range is 92-270 mg/dl before meals.   Patient Education Plan per assessed needs and concerns is to attend individual session for Diabetes Self Management Education.  Current HbA1c: 8.3%   Preferred Learning Style:   No preference indicated   Learning Readiness:   Ready  Change in progress  MEDICATIONS: see list  DIETARY INTAKE:  24-hr recall:  B ( AM): bacon and eggs, 1 slice toast with butter and pepper jelly, occasionally fried potatoes OR banana, OR bacon with toast, coffee with flavoring and Splenda  Snk ( AM): fresh fruit or unsalted nuts L ( PM): salami sandwich OR left overs, water and coffee Snk ( PM): same as AM, popcorn D ( PM): meat, vegetables or salad, occasionally a starch, water  Snk ( PM): 1-2 scoops ice cream occasionally Beverages: water, coffee  Usual physical activity: house work  Estimated energy needs: 1200 calories 135 g carbohydrates 90 g protein 33 g fat  Progress Towards Goal(s):  In progress.   Nutritional Diagnosis:  NB-1.1 Food and nutrition-related knowledge deficit As related to diabetes.  As evidenced by A1c of 8.3%.    Intervention:  Nutrition counseling provided.  Discussed diabetes disease process and treatment options.  Discussed physiology of diabetes and role of obesity on insulin resistance.  Encouraged moderate weight reduction to improve glucose levels.   Provided education on macronutrients on glucose levels.  Provided education on carb counting, importance of regularly scheduled meals/snacks, and meal planning  Discussed effects of physical activity on glucose levels and long-term glucose control.  Recommended types of physical  activity/week.  Reviewed patient medications.  Discussed role of medication on blood glucose and possible side effects  Discussed blood glucose monitoring and interpretation.  Discussed recommended target ranges and individual ranges.    Described short-term complications: hyper- and hypo-glycemia.  Discussed causes,symptoms, and treatment options.  Discussed prevention, detection, and treatment of long-term complications.  Discussed the role of prolonged elevated glucose levels on body systems.  Discussed role of stress on blood glucose levels and discussed strategies to manage psychosocial issues.  Discussed recommendations for long-term diabetes self-care.  Provided checklist for medical, dental, and emotional self-care.  Plan:  Aim for 2 Carb Choices per meal (30 grams) +/- 1 either way  Aim for 0-1 Carbs per snack if hungry  Include protein in moderation with your meals and snacks Consider reading food labels for Total Carbohydrate and Fat Grams of foods Consider  increasing your activity level by Arm Chair Exercises for 5-10 minutes 1-3 times daily as tolerated Continue checking BG at alternate times per day as directed by MD   Teaching Method Utilized: Visual, Auditory and Hands on  Handouts given during visit include: Living Well with Diabetes Carb Counting and Food Label handouts Meal Plan Card  Barriers to learning/adherence to lifestyle change: none  Diabetes self-care support plan:   [facility] support group available  Demonstrated degree of understanding via:  Teach Back   Monitoring/Evaluation:  Dietary intake, exercise, reading food labels, and body weight prn.

## 2013-10-17 NOTE — Patient Instructions (Signed)
Plan:  Aim for 2 Carb Choices per meal (30 grams) +/- 1 either way  Aim for 0-1 Carbs per snack if hungry  Include protein in moderation with your meals and snacks Consider reading food labels for Total Carbohydrate and Fat Grams of foods Consider  increasing your activity level by Arm Chair Exercises for 5-10 minutes 1-3 times daily as tolerated Continue checking BG at alternate times per day as directed by MD

## 2013-10-18 ENCOUNTER — Telehealth: Payer: Self-pay | Admitting: Endocrinology

## 2013-10-18 NOTE — Telephone Encounter (Signed)
Called pt and advised that based on the last lab results her A1C was 8.3. Dr. Loanne Drilling has changed her medication to the twice a day insulin. Pt voiced understanding and stated she would begin new insulin.

## 2013-10-18 NOTE — Telephone Encounter (Signed)
Pt just received the new set of humalog mix and he has questions regarding the change and dosage

## 2013-10-22 ENCOUNTER — Other Ambulatory Visit: Payer: Self-pay | Admitting: Internal Medicine

## 2013-10-22 DIAGNOSIS — Z1231 Encounter for screening mammogram for malignant neoplasm of breast: Secondary | ICD-10-CM

## 2013-11-02 ENCOUNTER — Other Ambulatory Visit: Payer: Self-pay | Admitting: Internal Medicine

## 2013-11-20 ENCOUNTER — Telehealth: Payer: Self-pay | Admitting: Internal Medicine

## 2013-11-20 NOTE — Telephone Encounter (Signed)
Caller name: [name] Relation to pt: self Call back number: [phone] Pharmacy:  Reason for call:   Patient states that Dr. Loanne Drilling is prescribing humalog mix 75/25 for her and that we do not need to refill any of this from now on.

## 2013-11-20 NOTE — Telephone Encounter (Signed)
thx

## 2013-11-20 NOTE — Telephone Encounter (Signed)
FYI

## 2013-11-28 ENCOUNTER — Telehealth: Payer: Self-pay | Admitting: Internal Medicine

## 2013-11-28 DIAGNOSIS — N183 Chronic kidney disease, stage 3 (moderate): Secondary | ICD-10-CM | POA: Diagnosis not present

## 2013-11-28 DIAGNOSIS — D631 Anemia in chronic kidney disease: Secondary | ICD-10-CM | POA: Diagnosis not present

## 2013-11-28 DIAGNOSIS — N189 Chronic kidney disease, unspecified: Secondary | ICD-10-CM | POA: Diagnosis not present

## 2013-11-28 DIAGNOSIS — I129 Hypertensive chronic kidney disease with stage 1 through stage 4 chronic kidney disease, or unspecified chronic kidney disease: Secondary | ICD-10-CM | POA: Diagnosis not present

## 2013-11-28 DIAGNOSIS — Z23 Encounter for immunization: Secondary | ICD-10-CM | POA: Diagnosis not present

## 2013-11-28 DIAGNOSIS — N2581 Secondary hyperparathyroidism of renal origin: Secondary | ICD-10-CM | POA: Diagnosis not present

## 2013-11-28 MED ORDER — LOSARTAN POTASSIUM 25 MG PO TABS: 25.0000 mg | ORAL_TABLET | Freq: Every day | ORAL | Status: DC

## 2013-11-28 NOTE — Telephone Encounter (Signed)
Informed Pt about ramipril, losartan 25 mg sent to [facility] on [place], instructed Pt to monitor BP's while taking losartan. Pt verbalized understanding.

## 2013-11-28 NOTE — Telephone Encounter (Signed)
Advise patient, I [facility], ramipril 5 mg is in back order. Plan:  Change  to losartan 25 mg one by mouth daily, #30 and one refill, send it to her local pharmacy Monitor BPs He has an appointment with me 12/18/2013, will check her labs then

## 2013-11-29 ENCOUNTER — Ambulatory Visit (HOSPITAL_COMMUNITY)
Admission: RE | Admit: 2013-11-29 | Discharge: 2013-11-29 | Disposition: A | Discharge status: Home / Self Care | Payer: Medicare Other | Source: Ambulatory Visit | Attending: Internal Medicine | Admitting: Internal Medicine

## 2013-11-29 DIAGNOSIS — Z1231 Encounter for screening mammogram for malignant neoplasm of breast: Secondary | ICD-10-CM | POA: Diagnosis not present

## 2013-12-04 ENCOUNTER — Encounter: Payer: Medicare Other | Attending: Internal Medicine | Admitting: *Deleted

## 2013-12-04 VITALS — Ht 63.75 in | Wt 227.1 lb

## 2013-12-04 DIAGNOSIS — E1140 Type 2 diabetes mellitus with diabetic neuropathy, unspecified: Secondary | ICD-10-CM

## 2013-12-04 DIAGNOSIS — Z794 Long term (current) use of insulin: Secondary | ICD-10-CM | POA: Insufficient documentation

## 2013-12-04 DIAGNOSIS — Z713 Dietary counseling and surveillance: Secondary | ICD-10-CM | POA: Insufficient documentation

## 2013-12-04 NOTE — Patient Instructions (Signed)
Plan:  Aim for 2 Carb Choices per meal (30 grams) +/- 1 either way  Aim for 0-1 Carbs per snack if hungry  Include protein in moderation with your meals and snacks Consider reading food labels for Total Carbohydrate and Fat Grams of foods Continue with your activity level by Arm Chair Exercises for 5-10 minutes 1-3 times daily as tolerated Continue checking BG at alternate times per day as directed by MD  Consider taking your Humalog 75/25 insulin before your breakfast and before supper meals

## 2013-12-04 NOTE — Progress Notes (Signed)
Appt start time: 1400 end time:  1430.  Assessment:  Patient was seen on  12/04/13 for individual diabetes education follow up. She is happy with her 4.5 # weight loss in past month. She is eating more fresh fruits and vegetables and fewer starchy and fatty foods. She is choosing only lean meats. She continues to SMBG with reported range of 90-160 mg/dl pre and post meals. She is also moving around more including taking the stairs throughout the day, she is going to warehouse type stores to walk around the perimeter. She is pleased with how good she feels too.  Patient Education Plan per assessed needs and concerns is to attend individual session for Diabetes Self Management Education.  Current HbA1c: 8.3%   Preferred Learning Style:   No preference indicated   Learning Readiness:   Ready  Change in progress  MEDICATIONS: see list  DIETARY INTAKE:  24-hr recall:  B ( AM): bacon and eggs, 1 slice toast with butter and pepper jelly, occasionally fried potatoes OR banana, OR bacon with toast, coffee with flavoring and Splenda  Snk ( AM): fresh fruit or unsalted nuts L ( PM): lean meat sandwich OR left overs, water and coffee Snk ( PM): same as AM, popcorn D ( PM): lean meat, vegetables or salad, occasionally a starch, water  Snk ( PM): only 1 scoop ice cream occasionally Beverages: water, coffee  Usual physical activity: house work  Estimated energy needs: 1200 calories 135 g carbohydrates 90 g protein 33 g fat  Progress Towards Goal(s):  In progress.   Nutritional Diagnosis:  NB-1.1 Food and nutrition-related knowledge deficit As related to diabetes.  As evidenced by A1c of 8.3%.    Intervention:  Nutrition counseling provided.  Commended her on her several behavior changes resulting in better BG control as well as weight loss   Reviewed education on macronutrients on glucose levels.  Provided education on carb counting, importance of regularly scheduled meals/snacks,  and meal planning  Reviewed patient medications.  Discussed role of medication on blood glucose and possible side effects. Encouraged her to take her insulin before her meals vs. afterwards.  Reviewed blood glucose monitoring and interpretation.  Discussed recommended target ranges and individual ranges.    Plan:  Aim for 2 Carb Choices per meal (30 grams) +/- 1 either way  Aim for 0-1 Carbs per snack if hungry  Include protein in moderation with your meals and snacks Consider reading food labels for Total Carbohydrate and Fat Grams of foods Continue with your activity level by Arm Chair Exercises for 5-10 minutes 1-3 times daily as tolerated Continue checking BG at alternate times per day as directed by MD  Consider taking your Humalog 75/25 insulin before your breakfast and before supper meals    Teaching Method Utilized: Visual, Auditory and Hands on  Handouts given during visit include: Insulin action handout  Barriers to learning/adherence to lifestyle change: none  Diabetes self-care support plan:   [facility] support group available  Demonstrated degree of understanding via:  Teach Back   Monitoring/Evaluation:  Dietary intake, exercise, reading food labels, and body weight in 1 month.

## 2013-12-07 ENCOUNTER — Other Ambulatory Visit: Payer: Self-pay

## 2013-12-13 ENCOUNTER — Other Ambulatory Visit: Payer: Self-pay

## 2013-12-18 ENCOUNTER — Ambulatory Visit (INDEPENDENT_AMBULATORY_CARE_PROVIDER_SITE_OTHER): Payer: Medicare Other | Admitting: Internal Medicine

## 2013-12-18 ENCOUNTER — Encounter: Payer: Self-pay | Admitting: Internal Medicine

## 2013-12-18 VITALS — BP 138/78 | HR 88 | Temp 97.7°F | Ht 64.0 in | Wt 229.1 lb

## 2013-12-18 DIAGNOSIS — J3089 Other allergic rhinitis: Secondary | ICD-10-CM

## 2013-12-18 DIAGNOSIS — R05 Cough: Secondary | ICD-10-CM

## 2013-12-18 DIAGNOSIS — E785 Hyperlipidemia, unspecified: Secondary | ICD-10-CM | POA: Diagnosis not present

## 2013-12-18 DIAGNOSIS — Z23 Encounter for immunization: Secondary | ICD-10-CM | POA: Diagnosis not present

## 2013-12-18 DIAGNOSIS — J309 Allergic rhinitis, unspecified: Secondary | ICD-10-CM

## 2013-12-18 DIAGNOSIS — I10 Essential (primary) hypertension: Secondary | ICD-10-CM

## 2013-12-18 DIAGNOSIS — N189 Chronic kidney disease, unspecified: Secondary | ICD-10-CM

## 2013-12-18 DIAGNOSIS — R609 Edema, unspecified: Secondary | ICD-10-CM | POA: Diagnosis not present

## 2013-12-18 DIAGNOSIS — Z0000 Encounter for general adult medical examination without abnormal findings: Secondary | ICD-10-CM

## 2013-12-18 DIAGNOSIS — R059 Cough, unspecified: Secondary | ICD-10-CM

## 2013-12-18 DIAGNOSIS — J302 Other seasonal allergic rhinitis: Secondary | ICD-10-CM

## 2013-12-18 DIAGNOSIS — Z780 Asymptomatic menopausal state: Secondary | ICD-10-CM

## 2013-12-18 LAB — LIPID PANEL
Cholesterol: 134 mg/dL (ref 0–200)
HDL: 45.7 mg/dL (ref >39.00)
LDL Cholesterol: 73 mg/dL (ref 0–99)
NONHDL: 88.3
TRIGLYCERIDES: 79 mg/dL (ref 0.0–149.0)
Total CHOL/HDL Ratio: 3
VLDL: 15.8 mg/dL (ref 0.0–40.0)

## 2013-12-18 LAB — TSH: TSH: 1.14 u[IU]/mL (ref 0.35–4.50)

## 2013-12-18 MED ORDER — HYDROCODONE-HOMATROPINE 5-1.5 MG/5ML PO SYRP: 5.0000 mL | ORAL_SOLUTION | Freq: Two times a day (BID) | ORAL | Status: DC | PRN

## 2013-12-18 NOTE — Assessment & Plan Note (Signed)
Reports frequent throat congestion that triggers cough, previously the one thing that helped   was a hydrocodone syrup. Request a prescription which is provided, follow-up with her allergist

## 2013-12-18 NOTE — Assessment & Plan Note (Signed)
On lipitor, labs

## 2013-12-18 NOTE — Patient Instructions (Addendum)
Get your blood work before you leave    Please come back to the office in 1 year for a physical exam. Come back fasting      Preventive Care for Adults   Ages 65 years and over  Blood pressure check.** / Every 1 to 2 years.  Lipid and cholesterol check.** / Every 5 years beginning at age 75 years.  Lung cancer screening. / Every year if you are aged 47-80 years and have a 30-pack-year history of smoking and currently smoke or have quit within the past 15 years. Yearly screening is stopped once you have quit smoking for at least 15 years or develop a health problem that would prevent you from having lung cancer treatment.  Clinical breast exam.** / Every year after age 101 years.  BRCA-related cancer risk assessment.** / For women who have family members with a BRCA-related cancer (breast, ovarian, tubal, or peritoneal cancers).  Mammogram.** / Every year beginning at age 48 years and continuing for as long as you are in good health. Consult with your health care provider.  Pap test.** / Every 3 years starting at age 84 years through age 63 or 59 years with 3 consecutive normal Pap tests. Testing can be stopped between 65 and 70 years with 3 consecutive normal Pap tests and no abnormal Pap or HPV tests in the past 10 years.  HPV screening.** / Every 3 years from ages 45 years through ages 55 or 20 years with a history of 3 consecutive normal Pap tests. Testing can be stopped between 65 and 70 years with 3 consecutive normal Pap tests and no abnormal Pap or HPV tests in the past 10 years.  Fecal occult blood test (FOBT) of stool. / Every year beginning at age 76 years and continuing until age 66 years. You may not need to do this test if you get a colonoscopy every 10 years.  Flexible sigmoidoscopy or colonoscopy.** / Every 5 years for a flexible sigmoidoscopy or every 10 years for a colonoscopy beginning at age 63 years and continuing until age 43 years.  Hepatitis C blood test.** /  For all people born from [birth year] through 1967 and any individual with known risks for hepatitis C.  Osteoporosis screening.** / A one-time screening for women ages 21 years and over and women at risk for fractures or osteoporosis.  Skin self-exam. / Monthly.  Influenza vaccine. / Every year.  Tetanus, diphtheria, and acellular pertussis (Tdap/Td) vaccine.** / 1 dose of Td every 10 years.  Varicella vaccine.** / Consult your health care provider.  Zoster vaccine.** / 1 dose for adults aged 19 years or older.  Pneumococcal 13-valent conjugate (PCV13) vaccine.** / Consult your health care provider.  Pneumococcal polysaccharide (PPSV23) vaccine.** / 1 dose for all adults aged 76 years and older.  Meningococcal vaccine.** / Consult your health care provider.  Hepatitis A vaccine.** / Consult your health care provider.  Hepatitis B vaccine.** / Consult your health care provider.  Haemophilus influenzae type b (Hib) vaccine.** / Consult your health care provider. ** Family history and personal history of risk and conditions may change your health care provider's recommendations. Document Released: 04/06/2001 Document Revised: 06/25/2013 Document Reviewed: 07/06/2010 [facility] Patient Information 2015 [place], [place]. This information is not intended to replace advice given to you by your health care provider. Make sure you discuss any questions you have with your health care provider.    Fall Prevention and Home Safety Falls cause injuries and can affect all age  groups. It is possible to use preventive measures to significantly decrease the likelihood of falls. There are many simple measures which can make your home safer and prevent falls. OUTDOORS  Repair cracks and edges of walkways and driveways.  Remove high doorway thresholds.  Trim shrubbery on the main path into your home.  Have good outside lighting.  Clear walkways of tools, rocks, debris, and clutter.  Check that  handrails are not broken and are securely fastened. Both sides of steps should have handrails.  Have leaves, snow, and ice cleared regularly.  Use sand or salt on walkways during winter months.  In the garage, clean up grease or oil spills. BATHROOM  Install night lights.  Install grab bars by the toilet and in the tub and shower.  Use non-skid mats or decals in the tub or shower.  Place a plastic non-slip stool in the shower to sit on, if needed.  Keep floors dry and clean up all water on the floor immediately.  Remove soap buildup in the tub or shower on a regular basis.  Secure bath mats with non-slip, double-sided rug tape.  Remove throw rugs and tripping hazards from the floors. BEDROOMS  Install night lights.  Make sure a bedside light is easy to reach.  Do not use oversized bedding.  Keep a telephone by your bedside.  Have a firm chair with side arms to use for getting dressed.  Remove throw rugs and tripping hazards from the floor. KITCHEN  Keep handles on pots and pans turned toward the center of the stove. Use back burners when possible.  Clean up spills quickly and allow time for drying.  Avoid walking on wet floors.  Avoid hot utensils and knives.  Position shelves so they are not too high or low.  Place commonly used objects within easy reach.  If necessary, use a sturdy step stool with a grab bar when reaching.  Keep electrical cables out of the way.  Do not use floor polish or wax that makes floors slippery. If you must use wax, use non-skid floor wax.  Remove throw rugs and tripping hazards from the floor. STAIRWAYS  Never leave objects on stairs.  Place handrails on both sides of stairways and use them. Fix any loose handrails. Make sure handrails on both sides of the stairways are as long as the stairs.  Check carpeting to make sure it is firmly attached along stairs. Make repairs to worn or loose carpet promptly.  Avoid placing  throw rugs at the top or bottom of stairways, or properly secure the rug with carpet tape to prevent slippage. Get rid of throw rugs, if possible.  Have an electrician put in a light switch at the top and bottom of the stairs. OTHER FALL PREVENTION TIPS  Wear low-heel or rubber-soled shoes that are supportive and fit well. Wear closed toe shoes.  When using a stepladder, make sure it is fully opened and both spreaders are firmly locked. Do not climb a closed stepladder.  Add color or contrast paint or tape to grab bars and handrails in your home. Place contrasting color strips on first and last steps.  Learn and use mobility aids as needed. Install an electrical emergency response system.  Turn on lights to avoid dark areas. Replace light bulbs that burn out immediately. Get light switches that glow.  Arrange furniture to create clear pathways. Keep furniture in the same place.  Firmly attach carpet with non-skid or double-sided tape.  Eliminate uneven  floor surfaces.  Select a carpet pattern that does not visually hide the edge of steps.  Be aware of all pets. OTHER HOME SAFETY TIPS  Set the water temperature for 120 F (48.8 C).  Keep emergency numbers on or near the telephone.  Keep smoke detectors on every level of the home and near sleeping areas. Document Released: 01/29/2002 Document Revised: 08/10/2011 Document Reviewed: 04/30/2011 [facility] Patient Information 2015 [employer], [place]. This information is not intended to replace advice given to you by your health care provider. Make sure you discuss any questions you have with your health care provider.

## 2013-12-18 NOTE — Assessment & Plan Note (Addendum)
Td--9-09 pneumonia shot-- 9-09 prevnar-- today Had a flu shot already got a shingles shot already   last mammogram 10-15 (-), negative breast exam 2014 PAPs, see previous entry, no further screening Cscope 02-2002 and 10-2012, bx tubular adenoma, next per  Dr Collene Mares  07-2009 DEXA-- normal, recheck DEXA, patient is postmenopausal    Diet-exercise discussed

## 2013-12-18 NOTE — Assessment & Plan Note (Signed)
Trace edema on exam.

## 2013-12-18 NOTE — Progress Notes (Signed)
Pre visit review using our clinic review tool, if applicable. No additional management support is needed unless otherwise documented below in the visit note. 

## 2013-12-18 NOTE — Assessment & Plan Note (Signed)
Last creatinine 1.4 few days ago, closely followed by nephrology

## 2013-12-18 NOTE — Assessment & Plan Note (Signed)
Controlled, continue with present care

## 2013-12-18 NOTE — Progress Notes (Signed)
Subjective:    Patient ID: Angel Green, female    DOB: 1940/09/20, 73 y.o.   MRN: [id number]  DOS:  12/18/2013 Type of visit - description :    Here for Medicare AWV:  1. Risk factors based on Past M, S, F history: reviewed  2. Physical Activities: Active, exercising more, lost some wt 3. Depression/mood: Neg screen  4. Hearing: No problems noted, reported  5. ADL's: Independent , drives 6. Fall Risk: Prevention discussed, no recent falls  7. home Safety: does feelsafe at home  8. Height, weight, &visual acuity: see VS, use glasses, sees the eye doctor q year  9. Counseling: provided  10. Labs ordered based on risk factors: if needed  11. Referral Coordination: if needed  12. Care Plan, see assessment and plan  13. Cognitive Assessment: Motor skills appropriate for age, constipation normal.  14. Care team updated   In addition, today we discussed the following: Diabetes, sees Dr. Loanne Drilling, recently saw a  nutritionist, doing better, has lost some weight Allergies, well controlled except for occasional cough High cholesterol, good medication compliance with Lipitor Hypertension, good medication compliance, ambulatory BPs checked frequently and are normal CKD -- note from nephrology reviewed, labs from few  days ago: Creatinine 1.4, LFTs normal, hemoglobin 14.3. Iron normal Potassium 3.9    ROS  Denies fever chills No chest pain or difficulty breathing No nausea, vomiting, diarrhea or blood in the stools Had a UTI few months ago, no dysuria, gross hematuria or difficulty urinating at the present time  Past Medical History  Diagnosis Date  . Hypertension   . Diabetes mellitus, type 2   . Hyperlipidemia   . Osteoarthritis   . Ankle pain     chronic  . Rhinitis     allergic nos  . RENAL INSUFFICIENCY, CHRONIC 04/16/2010    Past Surgical History  Procedure Laterality Date  . Cholecystectomy    . Partial hysterectomy      in her 54s  . Dental implants    .  Cataract extraction      History   Social History  . Marital Status: Married    Spouse Name: N/A    Number of Children: 2  . Years of Education: N/A   Occupational History  .  retired    Social History Main Topics  . Smoking status: Never Smoker   . Smokeless tobacco: Never Used  . Alcohol Use: No  . Drug Use: No  . Sexual Activity: Not on file   Other Topics Concern  . Not on file   Social History Narrative    husband is a Company secretary                 Family History  Problem Relation Age of Onset  . Heart attack Neg Hx   . Colon cancer Neg Hx   . Breast cancer Neg Hx   . Stroke Neg Hx       Medication List       This list is accurate as of: 12/18/13 11:59 PM.  Always use your most recent med list.               amLODipine 5 MG tablet  Commonly known as:  NORVASC  TAKE 1 TABLET (5 MG TOTAL) DAILY     aspirin 81 MG tablet  Take 81 mg by mouth daily.     atorvastatin 10 MG tablet  Commonly known as:  LIPITOR  Take 10 mg by  mouth daily at 6 PM.     B-12 PO  Take 1 tablet by mouth daily.     CENTRUM SILVER PO  Take 1 tablet by mouth daily.     DYMISTA 137-50 MCG/ACT Susp  Generic drug:  Azelastine-Fluticasone  Place 1-2 sprays into the nose daily.     fluticasone 44 MCG/ACT inhaler  Commonly known as:  FLOVENT HFA  Inhale 2 puffs into the lungs 2 (two) times daily.     furosemide 20 MG tablet  Commonly known as:  LASIX  Take 40 mg by mouth daily.     HYDROcodone-homatropine 5-1.5 MG/5ML syrup  Commonly known as:  HYCODAN  Take 5 mLs by mouth 2 (two) times daily as needed for cough.     insulin glargine 100 UNIT/ML injection  Commonly known as:  LANTUS  Inject 15 Units into the skin at bedtime.     Insulin Lispro Prot & Lispro (75-25) 100 UNIT/ML Kwikpen  Commonly known as:  HUMALOG MIX 75/25 KWIKPEN  30 units with breakfast, and 20 units with the evening meal, and pen needles 2/day     loratadine 10 MG tablet  Commonly known as:   CLARITIN  Take 10 mg by mouth daily.     losartan 25 MG tablet  Commonly known as:  COZAAR  Take 1 tablet (25 mg total) by mouth daily.     omeprazole 20 MG capsule  Commonly known as:  PRILOSEC  TAKE 1 CAPSULE DAILY     ramipril 5 MG capsule  Commonly known as:  ALTACE  TAKE 1 CAPSULE DAILY     SYSTANE 0.4-0.3 % Soln  Generic drug:  Polyethyl Glycol-Propyl Glycol  Apply 1 drop to eye daily as needed (for dry eyes).           Objective:   Physical Exam BP 138/78  Pulse 88  Temp(Src) 97.7 F (36.5 C) (Oral)  Ht 5\' 4"  (1.626 m)  Wt 229 lb 2 oz (103.93 kg)  BMI 39.31 kg/m2  SpO2 97% General -- alert, well-developed, NAD.  Neck --no thyromegaly  HEENT-- Not pale.   Lungs -- normal respiratory effort, no intercostal retractions, no accessory muscle use, and normal breath sounds.  Heart-- normal rate, regular rhythm, no murmur.  Abdomen-- Not distended, good bowel sounds,soft, non-tender. Extremities--trace  pretibial edema bilaterally  Neurologic--  alert & oriented X3. Speech normal, gait appropriate for age, strength symmetric and appropriate for age.  Psych-- Cognition and judgment appear intact. Cooperative with normal attention span and concentration. No anxious or depressed appearing.     Assessment & Plan:

## 2013-12-19 ENCOUNTER — Other Ambulatory Visit: Payer: Self-pay | Admitting: Internal Medicine

## 2013-12-21 ENCOUNTER — Ambulatory Visit (INDEPENDENT_AMBULATORY_CARE_PROVIDER_SITE_OTHER): Payer: Medicare Other | Admitting: Internal Medicine

## 2013-12-21 ENCOUNTER — Encounter: Payer: Self-pay | Admitting: Internal Medicine

## 2013-12-21 VITALS — BP 100/70 | HR 89 | Ht 64.0 in | Wt 235.0 lb

## 2013-12-21 DIAGNOSIS — J3089 Other allergic rhinitis: Principal | ICD-10-CM

## 2013-12-21 DIAGNOSIS — K219 Gastro-esophageal reflux disease without esophagitis: Secondary | ICD-10-CM

## 2013-12-21 DIAGNOSIS — J302 Other seasonal allergic rhinitis: Secondary | ICD-10-CM

## 2013-12-21 DIAGNOSIS — J309 Allergic rhinitis, unspecified: Secondary | ICD-10-CM

## 2013-12-21 NOTE — Patient Instructions (Signed)
Sample Dymista nasal spray   1-2 puffs each nostril once daily at bedtime  Continue prilosec acid blocker each morning before breakfast  Add otc acid blocker Pepcid 20 mg each evening before supper  -  To help [place] see if the feeling in your throat is partly due to reflux irritation

## 2013-12-21 NOTE — Progress Notes (Signed)
Patient ID: Angel Green, female    DOB: 10-28-1940, 73 y.o.   MRN: [id number]  HPI 08/12/10 -73 yoF never smoker, seen at kind request of Dr Larose Kells. Complains that for 3 years she has had excessive phlegm/ congestion in chest and throat. this is present most of the time. Nasal sprays reduce the drainage. Feels that this condition makes her weak and washed out. Symptoms worse in Spring and with weather changes. Seemed better while she was being treated for a cold. Denies dysphagia or reflux but has been told she has GERD. Nasal surgery-? Septoplasty?in past. Allergy skin tested twice with little positive response. Denies asthma, skin rash or relation to meals. Astelin and Flonase used intermittently.  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 Retired from [employer] work. CXR- neg, NAD 06/30/10  09/17/10- 08/12/10 -73 yoF never smoker,  Followed for allergic rhinitis, bronchitis. Complains that for 3 years she has had excessive phlegm/ congestion in chest and throat. Last visit we gave Qvar- big help; allegra- not much help by itself. 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 Lemonade drink mix makes phlegm come.   12/18/10--73 yoF never smoker,  Followed for allergic rhinitis, bronchitis. Complains that for 3 years she has had excessive phlegm/ congestion in chest and throat. Had flu vaccine. No acute problems since last here and feels well. Minor sniffling just in the last day but no cough, chest pain or significant sinus discomfort. She continues Qvar 40.  10/15/11- -73 yoF never smoker,  Followed for allergic rhinitis, bronchitis.  New problem-urticaria/angioedema. Had a viral URI syndrome then woke August 2 with facial and tongue swelling, hard to talk. Involvement especially in left cheek and left side of tongue. ER visit questioned allergy and treated with Benadryl 50 mg plus Pepcid 40 mg which continue, and therefore day course of prednisone. Her URI symptoms have been treated with Mucinex  and cough syrup. Allergy Profile 08/13/2010-total IgE 21.4, negative for specific elevations.  11/30/11- 73 yoF never smoker,  Followed for allergic rhinitis, bronchitis, urticaria/angioedemathese No swelling in tongue or face since last visit Still postnasal drip which she treats with Astelin. Dry cough when she lies down.  05/30/12- 72 yoF never smoker,  Followed for allergic rhinitis, bronchitis. Follows For:  Denies excess swelling at this time - Seasonal allergy complaints - runny nose , itchy eyes, sneezing Likes Dymista nasal spray. Has had no more episodes of angioedema or hives. Off Allegra in the last few days has had increased itching and watery nose. Coughs while lying down. Does not feel reflux while taking Prilosec. CXR-06/16/11  IMPRESSION:  No evidence of acute cardiopulmonary disease.  Original Report Authenticated By: Julian Hy, M.D.  11/29/12- 73 yoF never smoker,  Followed for allergic rhinitis, bronchitis complicated by renal insuff, DM2, HBP FOLLOWS FOR:  Runny nose, itchy and watery eyes- due to season change x 2 days. Likes Dymista- just restarted.  06/15/13- 73 yoF never smoker,  Followed for allergic rhinitis, bronchitis complicated by renal insuff, DM2, HBP FOLLOWS FOR: Pt went to [facility] ER about 3 weeks ago for left sided sinus pain and pressure.  Pt c/o postnasal drip, congestion. Postnasal drip is now clear or light brown after Z-Pak and Dymista nasal spray. CT sinus 05/17/13 IMPRESSION:  No significant sinus disease. Small concha bullosa deformity left  middle nasal turbinate .  Electronically Signed  By: Marcello Moores Register  On: 05/17/2013 14:45 CXR 04/23/13 IMPRESSION:  Limited study by poor inspiration. Elevation of the left  hemidiaphragm. Streaky bilateral basilar atelectasis or infiltrate  left greater than right.  Electronically Signed  By: Lahoma Crocker M.D.  On: 04/18/2013 10:40  12/21/13-  73 yoF never smoker,  Followed for allergic rhinitis,  bronchitis complicated by renal insuff, DM2, HBP FOLLOWS FOR: Allergic rhinitis patient complains of excessive mucus. She coughs only at bedtime. Patient denies chest tightness, wheezing and sob. Complains that phlegm sits in the back of her throat -when lying down she coughs No cough in the daytime. We discussed losartan and ramipril as potentially causing cough . Continues Prilosec. We discussed reflux as a throat irritant giving sensation there is something in the back of the throat.  Review of Systems-HPI Constitutional:   No-   weight loss, night sweats, fevers, chills, fatigue, lassitude. HEENT:   No-  headaches, difficulty swallowing, tooth/dental problems, sore throat,       No-sneezing, itching, ear ache, +nasal congestion, + post nasal drip,  CV:  No-   chest pain, orthopnea, PND, swelling in lower extremities, anasarca, dizziness, palpitations Resp: No-   shortness of breath with exertion or at rest.             No-  productive cough,  + non-productive cough,  No- coughing up of blood.              No-   change in color of mucus.  No- wheezing.   Skin: No-   rash or lesions. GI:  No-   heartburn, indigestion, abdominal pain, nausea, vomiting,  GU:  MS:  No-   joint pain or swelling.   Neuro-     nothing unusual Psych:  No- change in mood or affect. No depression or anxiety.  No memory loss.   Objective:   Physical Exam  General- Alert, Oriented, Affect-appropriate, Distress- none acute    obese Skin- rash-none, lesions- none, excoriation- none Lymphadenopathy- none Head- atraumatic. No facial asymmetry.             Eyes- Gross vision intact, PERRLA, conjunctivae clear secretions            Ears- Hearing, canals- normal            Nose- Clear, No-Septal dev, mucus, polyps, erosion, perforation             Throat- Mallampati II , +posterior pharynx is red and glandular , drainage- none, tonsils-                      atrophic Neck- flexible , trachea midline, no stridor ,  thyroid nl, carotid no bruit Chest - symmetrical excursion , unlabored           Heart/CV- RRR , no murmur , no gallop  , no rub, nl s1 s2                           - JVD- none , edema- none, stasis changes- none, varices- none           Lung- clear to P&A, wheeze- none, cough- none , dullness-none, rub- none           Chest wall-  Abd-  Br/ Gen/ Rectal- Not done, not indicated Extrem- cyanosis- none, clubbing, none, atrophy- none, strength- nl Neuro- grossly intact to observation

## 2013-12-22 NOTE — Assessment & Plan Note (Signed)
She complains of postnasal drainage sensation but what I see on exam is red throat suggestive of reflux. She will continue antihistamines and nasal sprays as needed

## 2013-12-22 NOTE — Assessment & Plan Note (Signed)
Plan-continue Prilosec in the morning, add Pepcid 20 mg before supper for 1 month trial

## 2014-01-02 ENCOUNTER — Other Ambulatory Visit: Payer: Self-pay

## 2014-01-02 ENCOUNTER — Ambulatory Visit (INDEPENDENT_AMBULATORY_CARE_PROVIDER_SITE_OTHER)
Admission: RE | Admit: 2014-01-02 | Discharge: 2014-01-02 | Disposition: A | Discharge status: Home / Self Care | Payer: Medicare Other | Source: Ambulatory Visit | Attending: Internal Medicine | Admitting: Internal Medicine

## 2014-01-02 ENCOUNTER — Telehealth: Payer: Self-pay

## 2014-01-02 DIAGNOSIS — M1990 Unspecified osteoarthritis, unspecified site: Secondary | ICD-10-CM

## 2014-01-02 DIAGNOSIS — M810 Age-related osteoporosis without current pathological fracture: Secondary | ICD-10-CM

## 2014-01-02 DIAGNOSIS — M159 Polyosteoarthritis, unspecified: Secondary | ICD-10-CM

## 2014-01-02 NOTE — Telephone Encounter (Signed)
Received call from [facility], requesting orders to be placed, which 2 orders have already been placed before. Was informed that they "do not release those." Orders reentered.

## 2014-01-14 ENCOUNTER — Ambulatory Visit (INDEPENDENT_AMBULATORY_CARE_PROVIDER_SITE_OTHER): Payer: Medicare Other | Admitting: Endocrinology

## 2014-01-14 ENCOUNTER — Encounter: Payer: Self-pay | Admitting: Endocrinology

## 2014-01-14 VITALS — BP 126/86 | HR 96 | Temp 97.9°F | Ht 64.0 in | Wt 228.0 lb

## 2014-01-14 DIAGNOSIS — E1140 Type 2 diabetes mellitus with diabetic neuropathy, unspecified: Secondary | ICD-10-CM

## 2014-01-14 LAB — HEMOGLOBIN A1C: HEMOGLOBIN A1C: 7.3 % — AB (ref 4.6–6.5)

## 2014-01-14 MED ORDER — INSULIN LISPRO PROT & LISPRO (75-25 MIX) 100 UNIT/ML KWIKPEN: PEN_INJECTOR | SUBCUTANEOUS | Status: DC

## 2014-01-14 NOTE — Patient Instructions (Addendum)
check your blood sugar 2 times a day.  vary the time of day when you check, between before the 3 meals, and at bedtime.  also check if you have symptoms of your blood sugar being too high or too low.  please keep a record of the readings and bring it to your next appointment here.  please call us sooner if your blood sugar goes below 70, or if you have a lot of readings over 200.   A diabetes blood test is requested for you today.  We'll contact you with results.  Please change the insulin to 35 units with breakfast, and 15 units with the evening meal.   Please come back for a follow-up appointment in 3 months.

## 2014-01-14 NOTE — Progress Notes (Signed)
Subjective:    Patient ID: Angel Green, female    DOB: 06-27-1940, 73 y.o.   MRN: [id number]  HPI  Pt returns for f/u of diabetes mellitus: DM type: Insulin-requiring type 2 Dx'ed: [id number] Complications: neuropathy of the lower extremities, and renal insufficiency.  Therapy: insulin since 2010. GDM: never DKA: never Severe hypoglycemia: never Pancreatitis: never Other: therapy has been limited by variable cbg's; she changed to BID premixed insulin, after poor results with multiple daily injections.  Interval history:  she brings a record of her cbg's which i have reviewed today.  It varies from 98-208, but most are in the mid-100's.  pt states she feels well in general.   Past Medical History  Diagnosis Date  . Hypertension   . Diabetes mellitus, type 2   . Hyperlipidemia   . Osteoarthritis   . Ankle pain     chronic  . Rhinitis     allergic nos  . RENAL INSUFFICIENCY, CHRONIC 04/16/2010    Past Surgical History  Procedure Laterality Date  . Cholecystectomy    . Partial hysterectomy      in her 56s  . Dental implants    . Cataract extraction      History   Social History  . Marital Status: Married    Spouse Name: N/A    Number of Children: 2  . Years of Education: N/A   Occupational History  .  retired    Social History Main Topics  . Smoking status: Never Smoker   . Smokeless tobacco: Never Used  . Alcohol Use: No  . Drug Use: No  . Sexual Activity: Not on file   Other Topics Concern  . Not on file   Social History Narrative    husband is a Company secretary                Current Outpatient Prescriptions on File Prior to Visit  Medication Sig Dispense Refill  . amLODipine (NORVASC) 5 MG tablet TAKE 1 TABLET (5 MG TOTAL) DAILY 90 tablet 1  . aspirin 81 MG tablet Take 81 mg by mouth daily.      Marland Kitchen atorvastatin (LIPITOR) 10 MG tablet TAKE 1 TABLET DAILY 90 tablet 3  . Azelastine-Fluticasone (DYMISTA) 137-50 MCG/ACT SUSP Place 1-2 sprays into the nose  daily.    . Cyanocobalamin (B-12 PO) Take 1 tablet by mouth daily.     . fluticasone (FLOVENT HFA) 44 MCG/ACT inhaler Inhale 2 puffs into the lungs 2 (two) times daily. 3 Inhaler 3  . furosemide (LASIX) 20 MG tablet Take 40 mg by mouth daily.    Marland Kitchen HYDROcodone-homatropine (HYCODAN) 5-1.5 MG/5ML syrup Take 5 mLs by mouth 2 (two) times daily as needed for cough. 180 mL 0  . loratadine (CLARITIN) 10 MG tablet Take 10 mg by mouth daily.    Marland Kitchen losartan (COZAAR) 25 MG tablet Take 1 tablet (25 mg total) by mouth daily. 30 tablet 1  . Multiple Vitamins-Minerals (CENTRUM SILVER PO) Take 1 tablet by mouth daily.      Marland Kitchen omeprazole (PRILOSEC) 20 MG capsule TAKE 1 CAPSULE DAILY 90 capsule 3  . Polyethyl Glycol-Propyl Glycol (SYSTANE) 0.4-0.3 % SOLN Apply 1 drop to eye daily as needed (for dry eyes).    . ramipril (ALTACE) 5 MG capsule TAKE 1 CAPSULE DAILY 90 capsule 1   No current facility-administered medications on file prior to visit.    No Known Allergies  Family History  Problem Relation Age of Onset  .  Heart attack Neg Hx   . Colon cancer Neg Hx   . Breast cancer Neg Hx   . Stroke Neg Hx     BP 126/86 mmHg  Pulse 96  Temp(Src) 97.9 F (36.6 C) (Oral)  Ht 5\' 4"  (1.626 m)  Wt 228 lb (103.42 kg)  BMI 39.12 kg/m2  SpO2 95%  Review of Systems She denies hypoglycemia.  She has lost a few lbs, due to her efforts.      Objective:   Physical Exam VITAL SIGNS:  See vs page GENERAL: no distress Pulses: dorsalis pedis intact bilat.   Feet: no deformity.  no edema Skin:  no ulcer on the feet.  normal color and temp. Neuro: sensation is intact to touch on the feet.    Lab Results  Component Value Date   HGBA1C 7.3* 01/14/2014      Assessment & Plan:  DM: improved glycemic control.  Based on the pattern of her cbg's, she needs some adjustment in her therapy. Weight loss.  She is encouraged to continue. Renal insufficiency: in this context, she should carefully check cbg's for  hypoglycemia.   Patient is advised the following: Patient Instructions  check your blood sugar 2 times a day.  vary the time of day when you check, between before the 3 meals, and at bedtime.  also check if you have symptoms of your blood sugar being too high or too low.  please keep a record of the readings and bring it to your next appointment here.  please call us sooner if your blood sugar goes below 70, or if you have a lot of readings over 200.   A diabetes blood test is requested for you today.  We'll contact you with results.  Please change the insulin to 35 units with breakfast, and 15 units with the evening meal.   Please come back for a follow-up appointment in 3 months.

## 2014-01-15 ENCOUNTER — Encounter: Payer: Medicare Other | Attending: Internal Medicine | Admitting: *Deleted

## 2014-01-15 DIAGNOSIS — Z794 Long term (current) use of insulin: Secondary | ICD-10-CM | POA: Diagnosis not present

## 2014-01-15 DIAGNOSIS — E1140 Type 2 diabetes mellitus with diabetic neuropathy, unspecified: Secondary | ICD-10-CM | POA: Diagnosis not present

## 2014-01-15 DIAGNOSIS — Z713 Dietary counseling and surveillance: Secondary | ICD-10-CM | POA: Diagnosis not present

## 2014-01-15 NOTE — Progress Notes (Signed)
Appt start time: 1400 end time:  1430.  Assessment:  Patient was seen on  01/15/14 for individual diabetes education follow up. She has been traveling this past month visiting family. Home now for the holidays. She states she is feeling much better with improved diabetes control. She is taking her insulin before meals now instead of after and seeing improved BG's.  She continues to SMBG 2-3 times each day with reported range of 84 - 170 mg/dl pre and post meals. She states she is comfortable with Carb Counting, eating fresh fruits and vegetables and lean meats. She continues to walk in a warehouse type store to get her exercise regularly.  Patient Education Plan per assessed needs and concerns is to attend individual session for Diabetes Self Management Education.  Current HbA1c: 8.3% now down to 7.3% on 01/14/14   Preferred Learning Style:   No preference indicated   Learning Readiness:   Ready  Change in progress  MEDICATIONS: see list  DIETARY INTAKE:  24-hr recall:  B ( AM): bacon and eggs, 1 slice toast with butter and pepper jelly, occasionally fried potatoes OR banana, OR bacon with toast, coffee with flavoring and Splenda  Snk ( AM): fresh fruit or unsalted nuts L ( PM): lean meat sandwich OR left overs, water and coffee Snk ( PM): same as AM, popcorn D ( PM): lean meat, vegetables or salad, occasionally a starch, water  Snk ( PM): only 1 scoop ice cream occasionally Beverages: water, coffee  Usual physical activity: house work  Estimated energy needs: 1200 calories 135 g carbohydrates 90 g protein 33 g fat  Progress Towards Goal(s):  In progress.   Nutritional Diagnosis:  NB-1.1 Food and nutrition-related knowledge deficit As related to diabetes.  As evidenced by A1c of 8.3% now down to 7.3%    Intervention:  Nutrition counseling provided.  Commended her on her continued healthy eating habits and activity plan  Reported her most recent A1c, notifying her that  it has dropped to 7.3%   Plan:  Aim for 2 Carb Choices per meal (30 grams) +/- 1 either way  Aim for 0-1 Carbs per snack if hungry  Include protein in moderation with your meals and snacks Consider reading food labels for Total Carbohydrate and Fat Grams of foods Continue with your activity level by Arm Chair Exercises for 5-10 minutes 1-3 times daily as tolerated Continue checking BG at alternate times per day as directed by MD  Consider taking your Humalog 75/25 insulin before your breakfast and before supper meals    Teaching Method Utilized: Visual, Auditory and Hands on  Handouts given during visit include: DM 2 Support Group Flyer  Barriers to learning/adherence to lifestyle change: none  Diabetes self-care support plan:   [facility] support group available  Demonstrated degree of understanding via:  Teach Back   Monitoring/Evaluation:  Dietary intake, exercise, reading food labels, and body weight in 3 months.

## 2014-03-08 DIAGNOSIS — H40013 Open angle with borderline findings, low risk, bilateral: Secondary | ICD-10-CM | POA: Diagnosis not present

## 2014-03-08 DIAGNOSIS — E119 Type 2 diabetes mellitus without complications: Secondary | ICD-10-CM | POA: Diagnosis not present

## 2014-03-08 DIAGNOSIS — Z961 Presence of intraocular lens: Secondary | ICD-10-CM | POA: Diagnosis not present

## 2014-03-08 LAB — HM DIABETES EYE EXAM

## 2014-03-26 ENCOUNTER — Encounter: Payer: Self-pay | Admitting: Internal Medicine

## 2014-03-26 ENCOUNTER — Ambulatory Visit (INDEPENDENT_AMBULATORY_CARE_PROVIDER_SITE_OTHER): Payer: Medicare Other | Admitting: Internal Medicine

## 2014-03-26 ENCOUNTER — Other Ambulatory Visit: Payer: Self-pay | Admitting: Internal Medicine

## 2014-03-26 VITALS — BP 148/81 | HR 94 | Temp 97.9°F | Ht 64.0 in | Wt 233.1 lb

## 2014-03-26 DIAGNOSIS — J302 Other seasonal allergic rhinitis: Secondary | ICD-10-CM

## 2014-03-26 DIAGNOSIS — I10 Essential (primary) hypertension: Secondary | ICD-10-CM | POA: Diagnosis not present

## 2014-03-26 DIAGNOSIS — J309 Allergic rhinitis, unspecified: Secondary | ICD-10-CM | POA: Diagnosis not present

## 2014-03-26 DIAGNOSIS — R059 Cough, unspecified: Secondary | ICD-10-CM

## 2014-03-26 DIAGNOSIS — J3089 Other allergic rhinitis: Secondary | ICD-10-CM

## 2014-03-26 DIAGNOSIS — R05 Cough: Secondary | ICD-10-CM

## 2014-03-26 MED ORDER — CROMOLYN SODIUM 5.2 MG/ACT NA AERS: 1.0000 | INHALATION_SPRAY | Freq: Four times a day (QID) | NASAL | Status: DC

## 2014-03-26 MED ORDER — LOSARTAN POTASSIUM 50 MG PO TABS: 50.0000 mg | ORAL_TABLET | Freq: Every day | ORAL | Status: DC

## 2014-03-26 MED ORDER — HYDROCODONE-HOMATROPINE 5-1.5 MG/5ML PO SYRP: 5.0000 mL | ORAL_SOLUTION | Freq: Two times a day (BID) | ORAL | Status: DC | PRN

## 2014-03-26 NOTE — Assessment & Plan Note (Addendum)
Due to upper resp sx, will: D/c altace Increase losartan from 25 to 50 mg BMP 1 months and check amb BPs

## 2014-03-26 NOTE — Assessment & Plan Note (Addendum)
Cont w/  allergy symptoms despite taking antihistaminics and dymista. Plan:  Add cromolyn Refer to allergist  Refill cough syrup  for symptomatic relief

## 2014-03-26 NOTE — Patient Instructions (Addendum)
Stop taking Altace  Change losartan to 50 mg one tablet daily  Come back in 2 weeks for labs, please make an appointment  Check the  blood pressure 2 or 3 times a  Week  Be sure your blood pressure is between 110/65 and  145/85.  if it is consistently higher or lower, let me know   Add the new nasal spray Chromelin 4 times a day

## 2014-03-26 NOTE — Progress Notes (Signed)
Subjective:    Patient ID: Angel Green, female    DOB: 08/11/1940, 74 y.o.   MRN: [id number]  DOS:  03/26/2014 Type of visit - description : acute Interval history: Many years history of nose congestion, postnasal dripping, throat congestion. Occasionally sees bloody  nasal discharge. Good compliance with nasal sprays and Claritin.    Review of Systems No fever or chills Denies itchy eyes or eye discharge, occasional nasal itching. Occasional cough she thinks related to postnasal dripping  Past Medical History  Diagnosis Date  . Hypertension   . Diabetes mellitus, type 2   . Hyperlipidemia   . Osteoarthritis   . Ankle pain     chronic  . Rhinitis     allergic nos  . RENAL INSUFFICIENCY, CHRONIC 04/16/2010    Past Surgical History  Procedure Laterality Date  . Cholecystectomy    . Partial hysterectomy      in her 5s  . Dental implants    . Cataract extraction      History   Social History  . Marital Status: Married    Spouse Name: N/A    Number of Children: 2  . Years of Education: N/A   Occupational History  .  retired    Social History Main Topics  . Smoking status: Never Smoker   . Smokeless tobacco: Never Used  . Alcohol Use: No  . Drug Use: No  . Sexual Activity: Not on file   Other Topics Concern  . Not on file   Social History Narrative    husband is a Company secretary                    Medication List       This list is accurate as of: 03/26/14  7:31 PM.  Always use your most recent med list.               amLODipine 5 MG tablet  Commonly known as:  NORVASC  TAKE 1 TABLET DAILY     aspirin 81 MG tablet  Take 81 mg by mouth daily.     atorvastatin 10 MG tablet  Commonly known as:  LIPITOR  TAKE 1 TABLET DAILY     B-12 PO  Take 1 tablet by mouth daily.     CENTRUM SILVER PO  Take 1 tablet by mouth daily.     cromolyn 5.2 MG/ACT nasal spray  Commonly known as:  NASALCROM  Place 1 spray into both nostrils 4 (four) times  daily.     DYMISTA 137-50 MCG/ACT Susp  Generic drug:  Azelastine-Fluticasone  Place 1-2 sprays into the nose daily.     fluticasone 44 MCG/ACT inhaler  Commonly known as:  FLOVENT HFA  Inhale 2 puffs into the lungs 2 (two) times daily.     furosemide 20 MG tablet  Commonly known as:  LASIX  Take 40 mg by mouth daily.     HYDROcodone-homatropine 5-1.5 MG/5ML syrup  Commonly known as:  HYCODAN  Take 5 mLs by mouth 2 (two) times daily as needed for cough.     Insulin Lispro Prot & Lispro (75-25) 100 UNIT/ML Kwikpen  Commonly known as:  HUMALOG MIX 75/25 KWIKPEN  35 units with breakfast, and 15 units with the evening meal, and pen needles 2/day.     loratadine 10 MG tablet  Commonly known as:  CLARITIN  Take 10 mg by mouth daily.     losartan 50 MG tablet  Commonly known  as:  COZAAR  Take 1 tablet (50 mg total) by mouth daily.     omeprazole 20 MG capsule  Commonly known as:  PRILOSEC  TAKE 1 CAPSULE DAILY     SYSTANE 0.4-0.3 % Soln  Generic drug:  Polyethyl Glycol-Propyl Glycol  Apply 1 drop to eye daily as needed (for dry eyes).           Objective:   Physical Exam  Constitutional: She is oriented to person, place, and time. She appears well-developed. No distress.  HENT:  Head: Normocephalic and atraumatic.  Right Ear: External ear normal.  Left Ear: External ear normal.  Mouth/Throat: Oropharynx is clear and moist.  Nose congested, sinuses not tender to palpation.  Eyes: Conjunctivae and EOM are normal. Right eye exhibits no discharge. Left eye exhibits no discharge.  Cardiovascular:  RRR, no murmur, rub or gallop  Pulmonary/Chest: Effort normal. No respiratory distress.  CTA B  Musculoskeletal: She exhibits no edema or tenderness.  Neurological: She is alert and oriented to person, place, and time. No cranial nerve deficit. She exhibits normal muscle tone. Coordination normal.  Speech normal, gait unassisted and normal for age, motor strength appropriate  for age   Skin: Skin is warm and dry. No pallor.  No jaundice  Psychiatric: She has a normal mood and affect. Her behavior is normal. Judgment and thought content normal.  Vitals reviewed.        Assessment & Plan:   Problem List Items Addressed This Visit    HTN (hypertension) - Primary    Due to upper resp sx, will: D/c altace Increase losartan from 25 to 50 mg BMP 1 months and check amb BPs      Relevant Medications   losartan (COZAAR) tablet   Other Relevant Orders   Basic metabolic panel   Seasonal and perennial allergic rhinitis    Cont w/  allergy symptoms despite taking antihistaminics and dymista. Plan:  Add cromolyn Refer to allergist       Relevant Orders   Ambulatory referral to Allergy    Other Visit Diagnoses    Cough        Relevant Medications    HYDROcodone-homatropine (HYCODAN) 5-1.5 MG/5ML syrup

## 2014-03-26 NOTE — Progress Notes (Signed)
Pre visit review using our clinic review tool, if applicable. No additional management support is needed unless otherwise documented below in the visit note. 

## 2014-04-01 ENCOUNTER — Emergency Department (HOSPITAL_COMMUNITY): Payer: Medicare Other

## 2014-04-01 ENCOUNTER — Encounter (HOSPITAL_COMMUNITY): Payer: Self-pay

## 2014-04-01 ENCOUNTER — Emergency Department (HOSPITAL_COMMUNITY)
Admission: EM | Admit: 2014-04-01 | Discharge: 2014-04-01 | Disposition: A | Discharge status: Home / Self Care | Payer: Medicare Other | Attending: Emergency Medicine | Admitting: Emergency Medicine

## 2014-04-01 DIAGNOSIS — R0981 Nasal congestion: Secondary | ICD-10-CM | POA: Diagnosis not present

## 2014-04-01 DIAGNOSIS — E119 Type 2 diabetes mellitus without complications: Secondary | ICD-10-CM | POA: Insufficient documentation

## 2014-04-01 DIAGNOSIS — Z7952 Long term (current) use of systemic steroids: Secondary | ICD-10-CM | POA: Insufficient documentation

## 2014-04-01 DIAGNOSIS — I10 Essential (primary) hypertension: Secondary | ICD-10-CM | POA: Insufficient documentation

## 2014-04-01 DIAGNOSIS — Z79899 Other long term (current) drug therapy: Secondary | ICD-10-CM | POA: Diagnosis not present

## 2014-04-01 DIAGNOSIS — R05 Cough: Secondary | ICD-10-CM

## 2014-04-01 DIAGNOSIS — M1990 Unspecified osteoarthritis, unspecified site: Secondary | ICD-10-CM | POA: Diagnosis not present

## 2014-04-01 DIAGNOSIS — Z794 Long term (current) use of insulin: Secondary | ICD-10-CM | POA: Insufficient documentation

## 2014-04-01 DIAGNOSIS — R059 Cough, unspecified: Secondary | ICD-10-CM

## 2014-04-01 DIAGNOSIS — E785 Hyperlipidemia, unspecified: Secondary | ICD-10-CM | POA: Diagnosis not present

## 2014-04-01 MED ORDER — OXYMETAZOLINE HCL 0.05 % NA SOLN
2.0000 | Freq: Once | NASAL | Status: AC
  Administered 2014-04-01: 2 via NASAL
  Filled 2014-04-01: qty 15

## 2014-04-01 NOTE — ED Provider Notes (Signed)
TIME SEEN: 4:15 AM  CHIEF COMPLAINT: Nasal congestion, sore throat, cough  HPI: Pt is a 74 y.o. female with history of hypertension, diabetes, hyperlipidemia, chronic rhinitis who has had nasal congestion and throat congestion for years per her PCPs note who presents to the emergency department with complaints of nasal congestion, sore throat and cough. States she is here because she cannot sleep. She is on Dymista and Claritin at home and recently saw her primary care physician Dr. Larose Kells who started her on Cromolyn but she has not started this medication because her pharmacy did not carry it. She denies any fevers. No shortness of breath. No vomiting or diarrhea. No headache.  ROS: See HPI Constitutional: no fever  Eyes: no drainage  ENT: runny nose   Cardiovascular:  no chest pain  Resp: no SOB  GI: no vomiting GU: no dysuria Integumentary: no rash  Allergy: no hives  Musculoskeletal: no leg swelling  Neurological: no slurred speech ROS otherwise negative  PAST MEDICAL HISTORY/PAST SURGICAL HISTORY:  Past Medical History  Diagnosis Date  . Hypertension   . Diabetes mellitus, type 2   . Hyperlipidemia   . Osteoarthritis   . Ankle pain     chronic  . Rhinitis     allergic nos  . RENAL INSUFFICIENCY, CHRONIC 04/16/2010    MEDICATIONS:  Prior to Admission medications   Medication Sig Start Date End Date Taking? Authorizing Provider  amLODipine (NORVASC) 5 MG tablet TAKE 1 TABLET DAILY 03/26/14   Colon Branch, MD  aspirin 81 MG tablet Take 81 mg by mouth daily.      Historical Provider, MD  atorvastatin (LIPITOR) 10 MG tablet TAKE 1 TABLET DAILY 12/19/13   Colon Branch, MD  Azelastine-Fluticasone [facility]) 137-50 MCG/ACT SUSP Place 1-2 sprays into the nose daily.    Historical Provider, MD  cromolyn (NASALCROM) 5.2 MG/ACT nasal spray Place 1 spray into both nostrils 4 (four) times daily. 03/26/14   Colon Branch, MD  Cyanocobalamin (B-12 PO) Take 1 tablet by mouth daily.     Historical  Provider, MD  fluticasone (FLOVENT HFA) 44 MCG/ACT inhaler Inhale 2 puffs into the lungs 2 (two) times daily. 07/19/13   Deneise Lever, MD  furosemide (LASIX) 20 MG tablet Take 40 mg by mouth daily.    Historical Provider, MD  HYDROcodone-homatropine (HYCODAN) 5-1.5 MG/5ML syrup Take 5 mLs by mouth 2 (two) times daily as needed for cough. 03/26/14   Colon Branch, MD  Insulin Lispro Prot & Lispro (HUMALOG MIX 75/25 KWIKPEN) (75-25) 100 UNIT/ML Kwikpen 35 units with breakfast, and 15 units with the evening meal, and pen needles 2/day. 01/14/14   Renato Shin, MD  loratadine (CLARITIN) 10 MG tablet Take 10 mg by mouth daily.    Historical Provider, MD  losartan (COZAAR) 50 MG tablet Take 1 tablet (50 mg total) by mouth daily. 03/26/14   Colon Branch, MD  Multiple Vitamins-Minerals (CENTRUM SILVER PO) Take 1 tablet by mouth daily.      Historical Provider, MD  omeprazole (PRILOSEC) 20 MG capsule TAKE 1 CAPSULE DAILY 12/19/13   Colon Branch, MD  Polyethyl Glycol-Propyl Glycol (SYSTANE) 0.4-0.3 % SOLN Apply 1 drop to eye daily as needed (for dry eyes).    Historical Provider, MD    ALLERGIES:  No Known Allergies  SOCIAL HISTORY:  History  Substance Use Topics  . Smoking status: Never Smoker   . Smokeless tobacco: Never Used  . Alcohol Use: No  FAMILY HISTORY: Family History  Problem Relation Age of Onset  . Heart attack Neg Hx   . Colon cancer Neg Hx   . Breast cancer Neg Hx   . Stroke Neg Hx     EXAM: BP 137/79 mmHg  Pulse 103  Temp(Src) 98.5 F (36.9 C) (Oral)  Resp 20  Ht 5\' 4"  (1.626 m)  Wt 230 lb (104.327 kg)  BMI 39.46 kg/m2  SpO2 95% CONSTITUTIONAL: Alert and oriented and responds appropriately to questions. Well-appearing; well-nourished HEAD: Normocephalic EYES: Conjunctivae clear, PERRL ENT: normal nose; no rhinorrhea; moist mucous membranes; pharynx without lesions noted, no tonsillar hypertrophy or exudate, clear nasal sinus drainage in the posterior oropharynx, no trismus  or drooling, no uvular deviation, no sinus tenderness or erythema or warmth NECK: Supple, no meningismus, no LAD  CARD: RRR; S1 and S2 appreciated; no murmurs, no clicks, no rubs, no gallops RESP: Normal chest excursion without splinting or tachypnea; breath sounds clear and equal bilaterally; no wheezes, no rhonchi, no rales, no hypoxia or respiratory distress ABD/GI: Normal bowel sounds; non-distended; soft, non-tender, no rebound, no guarding BACK:  The back appears normal and is non-tender to palpation, there is no CVA tenderness EXT: Normal ROM in all joints; non-tender to palpation; no edema; normal capillary refill; no cyanosis    SKIN: Normal color for age and race; warm NEURO: Moves all extremities equally PSYCH: The patient's mood and manner are appropriate. Grooming and personal hygiene are appropriate.  MEDICAL DECISION MAKING: Patient here with nasal congestion, sore throat and cough that has been present for years. She has been diagnosed with allergic rhinitis and her PCP is referring her to an allergy specialist. She was prescribed a new medication, cromolyn, by her PCP but has not yet started this medication. Had a long discussion with patient about her expectations for this visit. She states that she was hoping to find something to make this "go away". Discussed with patient that I do not feel there is any life-threatening, emergent illness present but given her cough with productive sputum will obtain a chest x-ray. I do not feel antibiotics will help her symptoms. She is afebrile, nontoxic appearing.  Have recommended that she continue her nasal sprays as prescribed by her primary care physician. She has follow-up scheduled with her PCP.  ED PROGRESS: Patient's chest x-ray is clear. At this time I do not feel there is any indication to start the patient on antibiotics. She does not have a sinus headache or sinus tenderness with palpation over frontal or maxillary sinuses. She is are  you on a topical antihistamine, topical steroid and an oral anti-histamine. She was recently started on cromolyn by her doctor and have urged her to make sure that her pharmacy orders his medications that she may pick up. Have discussed at length with patient that given this is been going on for years that we will likely not be able to fix this problem tonight and she needs to continue with close outpatient follow-up. Discussed return precautions. She verbalized understanding and is comfortable with plan.     [employer], DO 04/01/14 [phone]

## 2014-04-01 NOTE — ED Notes (Signed)
Pt reports sore throat and nasal congestion, ongoing for months. Nasal spray given by PCP is not working. Denies N/V. Green and white phlegm with cough.

## 2014-04-01 NOTE — Discharge Instructions (Signed)
You were seen in the emergency department for nasal congestion. This is likely what is causing her dry throat and your cough. Her chest x-ray shows no pneumonia. I recommended that you continue taking your Claritin and Dymista spray as prescribed. I recommended she go to your pharmacy and make sure that they fill your Cromolyn nasal spray and start this medication as well. Given that your symptoms have been going on for a very prolonged period of time this may take a prolonged period of time for this to improve.   At this time I do not feel antibiotics will help you as a do not think this is an infection left causing your symptoms.  You may use over-the-counter nasal saline several times a day to help clear some of your nasal congestion. You may also use Afrin nasal spray but do not use this medication for more than 3 days because this may cause your nasal congestion to worsen.    Cromolyn Sodium nasal spray What is this medicine? CROMOLYN SODIUM (KROE moe lin SOE dee um) helps block allergic reactions. This medicine is used to treat nasal allergy symptoms. This medicine will not fight an infection. This medicine may be used for other purposes; ask your health care provider or pharmacist if you have questions. COMMON BRAND NAME(S): Nasalcrom What should I tell my health care provider before I take this medicine? They need to know if you have any of these conditions: -asthma -cold or sinus infection -fever -kidney disease -liver disease -nasal polyps -an unusual or allergic reaction to cromolyn, other medicines, foods, dyes, or preservatives -pregnant or trying to get pregnant -breast-feeding How should I use this medicine? Use this medicine in the nose. Do not take by mouth. Follow the directions on the package label. Use your medicine at regular intervals. Do not use it more often than directed. Make sure that you are using your nasal spray correctly. Ask your doctor or health care provider if  you have any questions. Talk to your pediatrician regarding the use of this medicine in children. Special care may be needed. Overdosage: If you think you have taken too much of this medicine contact a poison control center or emergency room at once. NOTE: This medicine is only for you. Do not share this medicine with others. What if I miss a dose? If you miss a dose, use it as soon as you can. If it is almost time for your next dose, use only that dose. Do not use double or extra doses. What may interact with this medicine? Interactions are not expected. This list may not describe all possible interactions. Give your health care provider a list of all the medicines, herbs, non-prescription drugs, or dietary supplements you use. Also tell them if you smoke, drink alcohol, or use illegal drugs. Some items may interact with your medicine. What should I watch for while using this medicine? Visit your doctor or health care professional for regular check ups as directed. Tell your doctor or health care professional if your symptoms do not start to get better within 1 to 2 weeks or if they get worse. You may use other allergy medicines with this medicine, if needed. Ask your doctor or health care professional for advice. Do not share this bottle with anyone else as this may spread germs. What side effects may I notice from receiving this medicine? Side effects that you should report to your doctor or health care professional as soon as possible: -allergic reactions like  skin rash, itching or hives, swelling of the face, lips, or tongue -breathing problems -chest tightness -dizziness -fever, infection -nosebleed -unusually weak or tired Side effects that usually do not require medical attention (report to your doctor or health care professional if they continue or are bothersome): -bad taste -burning or irritation in the nose right after use -cough -headache -sneezing This list may not describe  all possible side effects. Call your doctor for medical advice about side effects. You may report side effects to FDA at 1-800-FDA-1088. Where should I keep my medicine? Keep out of the reach of children. Store at room temperature between 20 and 25 degrees C (68 and 77 degrees F). Do not freeze. Throw away any unused medicine after the expiration date. NOTE: This sheet is a summary. It may not cover all possible information. If you have questions about this medicine, talk to your doctor, pharmacist, or health care provider.  2015, Elsevier/Gold Standard. (2010-09-09 14:14:39)   Azelastine; Fluticasone nasal spray What is this medicine? AZELASTINE; FLUTICASONE (a ZEL as teen; floo TIK a sone) is a combination of a histamine blocker and a corticosteroid. This medicine is used to treat the symptoms of allergies like sneezing, itching, and runny or stuffy nose. This medicine may be used for other purposes; ask your health care provider or pharmacist if you have questions. COMMON BRAND NAME(S): Dymista What should I tell my health care provider before I take this medicine? They need to know if you have any of these conditions: -cataracts -glaucoma -infection, like tuberculosis, herpes, or fungal infection -recent surgery or injury of the nose or sinuses -taking a corticosteroid by mouth -an unusual or allergic reaction to azelastine, fluticasone, steroids, other medicines, foods, dyes, or preservatives -pregnant or trying to get pregnant -breast-feeding How should I use this medicine? This medicine is for use in the nose. Follow the directions on the prescription label. Shake well before using. Do not use more often than directed. Make sure that you are using your nasal spray correctly. Ask you doctor or health care provider if you have any questions. Talk to your pediatrician regarding the use of this medicine in children. While this drug may be prescribed for children as young as 6 years for  selected conditions, precautions do apply. Overdosage: If you think you've taken too much of this medicine contact a poison control center or emergency room at once. Overdosage: If you think you have taken too much of this medicine contact a poison control center or emergency room at once. NOTE: This medicine is only for you. Do not share this medicine with others. What if I miss a dose? If you miss a dose, use it as soon as you can. If it is almost time for your next dose, use only that dose. Do not use double or extra doses. What may interact with this medicine? -alcohol -certain medicines for anxiety or sleep -cimetidine -ketoconazole -metyrapone -other antihistamines -some medicines for HIV -vaccines This list may not describe all possible interactions. Give your health care provider a list of all the medicines, herbs, non-prescription drugs, or dietary supplements you use. Also tell them if you smoke, drink alcohol, or use illegal drugs. Some items may interact with your medicine. What should I watch for while using this medicine? Tell your doctor or healthcare professional if your symptoms do not start to get better or if they get worse. You may get drowsy or dizzy. Drinking alcohol or taking medicine that causes drowsiness can make this  worse. Do not drive, use machinery, or do anything that needs mental alertness until you know how this medicine affects you. This medicine may increase your risk of getting an infection. Tell your doctor or health care professional if you are around anyone with measles or chickenpox, or if you develop sores or blisters that do not heal properly. What side effects may I notice from receiving this medicine? Side effects that you should report to your doctor or health care professional as soon as possible: -allergic reactions like skin rash, itching or hives, swelling of the face, lips, or tongue -breathing problems -changes in vision -fast  heartbeat -flu-like symptoms -high blood pressure -infection -nose bleeding, sores -white patches or sores in the mouth or nose Side effects that usually do not require medical attention (Report these to your doctor or health care professional if they continue or are bothersome.): -changes in smell or taste -cough -feeling tired -headache -larger appetite or weight gain -nose or throat irritation -sneezing This list may not describe all possible side effects. Call your doctor for medical advice about side effects. You may report side effects to FDA at 1-800-FDA-1088. Where should I keep my medicine? Keep out of the reach of children. Store upright and tightly closed at room temperature between 20 and 25 degrees C (68 and 77 degrees F). Do not freeze. Throw away any unused medicine after the expiration date or after 120 sprays, whichever comes first. NOTE: This sheet is a summary. It may not cover all possible information. If you have questions about this medicine, talk to your doctor, pharmacist, or health care provider.  2015, Elsevier/Gold Standard. (2013-04-17 10:08:01)

## 2014-04-02 ENCOUNTER — Telehealth: Payer: Self-pay | Admitting: Internal Medicine

## 2014-04-02 ENCOUNTER — Other Ambulatory Visit: Payer: Self-pay

## 2014-04-02 DIAGNOSIS — I10 Essential (primary) hypertension: Secondary | ICD-10-CM

## 2014-04-02 MED ORDER — LOSARTAN POTASSIUM 50 MG PO TABS: 50.0000 mg | ORAL_TABLET | Freq: Every day | ORAL | Status: DC

## 2014-04-02 NOTE — Telephone Encounter (Signed)
Only long term medication sent to [facility] was Losartan on 03/26/2014, resent to [facility].

## 2014-04-02 NOTE — Telephone Encounter (Signed)
Caller name: Charlean Relation to pt: self Call back number: [phone] Pharmacy:  Reason for call:   Patient states that all medications need to be sent to [facility]. The only time she uses local pharmacy is if she needs an emergency supply. Please resend.

## 2014-04-09 ENCOUNTER — Telehealth: Payer: Self-pay

## 2014-04-09 ENCOUNTER — Other Ambulatory Visit (INDEPENDENT_AMBULATORY_CARE_PROVIDER_SITE_OTHER): Payer: Medicare Other

## 2014-04-09 ENCOUNTER — Telehealth: Payer: Self-pay | Admitting: Internal Medicine

## 2014-04-09 DIAGNOSIS — I10 Essential (primary) hypertension: Secondary | ICD-10-CM | POA: Diagnosis not present

## 2014-04-09 LAB — BASIC METABOLIC PANEL
BUN: 12 mg/dL (ref 6–23)
CALCIUM: 9.7 mg/dL (ref 8.4–10.5)
CO2: 34 mEq/L — ABNORMAL HIGH (ref 19–32)
CREATININE: 1.49 mg/dL — AB (ref 0.40–1.20)
Chloride: 103 mEq/L (ref 96–112)
GFR: 43.75 mL/min — ABNORMAL LOW (ref >60.00)
Glucose, Bld: 151 mg/dL — ABNORMAL HIGH (ref 70–99)
Potassium: 4.1 mEq/L (ref 3.5–5.1)
Sodium: 140 mEq/L (ref 135–145)

## 2014-04-09 NOTE — Telephone Encounter (Signed)
Pt came into lab this morning for blood work and wanted to inform of latest BP readings:  Sunday (04/07/2014) AM: 127/64 PM: 119/61  Monday (04/08/2014) AM: 141-76 PM: 123/70

## 2014-04-09 NOTE — Telephone Encounter (Signed)
Caller name: [facility]  Call back number: [phone] (p) [phone] (f) Pharmacy:  Reason for call:  [facility] faxed over request for wrist hand brace. Faxed received and placed in [place].

## 2014-04-09 NOTE — Telephone Encounter (Signed)
Forms faxed back to [place] them unable to sign per Dr. Larose Kells, Pt has not been seen regarding need for wrist/knee brace.    Forms faxed back to [place] them unable to sign per Dr. Larose Kells, Pt has not been seen regarding need for ankle/knee brace.

## 2014-04-09 NOTE — Telephone Encounter (Signed)
Also received on back fax, Pts chart reviewed and form given to Dr. Larose Kells.    Also received form from [facility] requesting knee orthosis and ankle orthosis for Pt, reviewed and given to Dr. Larose Kells for review and signing.

## 2014-04-09 NOTE — Telephone Encounter (Signed)
Advise patient, I did receive the request, I can't sign it because we have not recently discussed CTS problems . OV if so desire

## 2014-04-11 DIAGNOSIS — K219 Gastro-esophageal reflux disease without esophagitis: Secondary | ICD-10-CM | POA: Diagnosis not present

## 2014-04-11 DIAGNOSIS — J3089 Other allergic rhinitis: Secondary | ICD-10-CM | POA: Diagnosis not present

## 2014-04-11 DIAGNOSIS — R05 Cough: Secondary | ICD-10-CM | POA: Diagnosis not present

## 2014-04-11 DIAGNOSIS — J0110 Acute frontal sinusitis, unspecified: Secondary | ICD-10-CM | POA: Diagnosis not present

## 2014-04-12 DIAGNOSIS — J3089 Other allergic rhinitis: Secondary | ICD-10-CM | POA: Diagnosis not present

## 2014-04-15 ENCOUNTER — Encounter: Payer: Self-pay | Admitting: Endocrinology

## 2014-04-15 ENCOUNTER — Ambulatory Visit (INDEPENDENT_AMBULATORY_CARE_PROVIDER_SITE_OTHER): Payer: Medicare Other | Admitting: Endocrinology

## 2014-04-15 VITALS — BP 128/86 | HR 95 | Temp 97.6°F | Ht 64.0 in | Wt 230.0 lb

## 2014-04-15 DIAGNOSIS — E1140 Type 2 diabetes mellitus with diabetic neuropathy, unspecified: Secondary | ICD-10-CM

## 2014-04-15 LAB — HEMOGLOBIN A1C: HEMOGLOBIN A1C: 8.5 % — AB (ref 4.6–6.5)

## 2014-04-15 NOTE — Patient Instructions (Addendum)
check your blood sugar 2 times a day.  vary the time of day when you check, between before the 3 meals, and at bedtime.  also check if you have symptoms of your blood sugar being too high or too low.  please keep a record of the readings and bring it to your next appointment here.  please call us sooner if your blood sugar goes below 70, or if you have a lot of readings over 200.   A diabetes blood test is requested for you today.  We'll contact you with results.  Please continue 35 units with breakfast, and 15 units with the evening meal.   Please come back for a follow-up appointment in 3 months.   As i work only on your diabetes, please ask Dr Larose Kells (or a doctor you see for that condition), for other prescriptions.

## 2014-04-15 NOTE — Progress Notes (Signed)
Subjective:    Patient ID: Angel Green, female    DOB: 04-May-1940, 74 y.o.   MRN: [id number]  HPI Pt returns for f/u of diabetes mellitus: DM type: Insulin-requiring type 2 Dx'ed: [id number] Complications: neuropathy of the lower extremities, and renal insufficiency.  Therapy: insulin since 2010. GDM: never. DKA: never Severe hypoglycemia: never Pancreatitis: never Other: therapy has been limited by variable cbg's; she changed to BID premixed insulin, after poor results with multiple daily injections.  Interval history:  no cbg record, but states cbg's are in the low-100's.  It is in general higher as the day goes on.  pt states she feels well in general.  Pt says she never misses the insulin.  She wants a refill of her hycodan for cough.   Past Medical History  Diagnosis Date  . Hypertension   . Diabetes mellitus, type 2   . Hyperlipidemia   . Osteoarthritis   . Ankle pain     chronic  . Rhinitis     allergic nos  . RENAL INSUFFICIENCY, CHRONIC 04/16/2010    Past Surgical History  Procedure Laterality Date  . Cholecystectomy    . Partial hysterectomy      in her 15s  . Dental implants    . Cataract extraction      History   Social History  . Marital Status: Married    Spouse Name: N/A  . Number of Children: 2  . Years of Education: N/A   Occupational History  .  retired    Social History Main Topics  . Smoking status: Never Smoker   . Smokeless tobacco: Never Used  . Alcohol Use: No  . Drug Use: No  . Sexual Activity: Not on file   Other Topics Concern  . Not on file   Social History Narrative    husband is a Company secretary                Current Outpatient Prescriptions on File Prior to Visit  Medication Sig Dispense Refill  . amLODipine (NORVASC) 5 MG tablet TAKE 1 TABLET DAILY 90 tablet 2  . aspirin 81 MG tablet Take 81 mg by mouth daily.      Marland Kitchen atorvastatin (LIPITOR) 10 MG tablet TAKE 1 TABLET DAILY 90 tablet 3  . cromolyn (NASALCROM) 5.2 MG/ACT  nasal spray Place 1 spray into both nostrils 4 (four) times daily. 26 mL 12  . Cyanocobalamin (B-12 PO) Take 1 tablet by mouth daily.     . fluticasone (FLOVENT HFA) 44 MCG/ACT inhaler Inhale 2 puffs into the lungs 2 (two) times daily. 3 Inhaler 3  . furosemide (LASIX) 20 MG tablet Take 40 mg by mouth daily.    Marland Kitchen HYDROcodone-homatropine (HYCODAN) 5-1.5 MG/5ML syrup Take 5 mLs by mouth 2 (two) times daily as needed for cough. 120 mL 0  . Insulin Lispro Prot & Lispro (HUMALOG MIX 75/25 KWIKPEN) (75-25) 100 UNIT/ML Kwikpen 35 units with breakfast, and 15 units with the evening meal, and pen needles 2/day. (Patient taking differently: 45 units with breakfast, and 15 units with the evening meal, and pen needles 2/day.) 75 mL 11  . loratadine (CLARITIN) 10 MG tablet Take 10 mg by mouth daily.    Marland Kitchen losartan (COZAAR) 50 MG tablet Take 1 tablet (50 mg total) by mouth daily. 90 tablet 1  . Multiple Vitamins-Minerals (CENTRUM SILVER PO) Take 1 tablet by mouth daily.      Marland Kitchen omeprazole (PRILOSEC) 20 MG capsule TAKE 1 CAPSULE DAILY  90 capsule 3  . Polyethyl Glycol-Propyl Glycol (SYSTANE) 0.4-0.3 % SOLN Apply 1 drop to eye daily as needed (for dry eyes).     No current facility-administered medications on file prior to visit.    No Known Allergies  Family History  Problem Relation Age of Onset  . Heart attack Neg Hx   . Colon cancer Neg Hx   . Breast cancer Neg Hx   . Stroke Neg Hx     BP 128/86 mmHg  Pulse 95  Temp(Src) 97.6 F (36.4 C) (Oral)  Ht 5\' 4"  (1.626 m)  Wt 230 lb (104.327 kg)  BMI 39.46 kg/m2  SpO2 98%  Review of Systems She denies hypoglycemia and weight change    Objective:   Physical Exam VITAL SIGNS:  See vs page GENERAL: no distress Pulses: dorsalis pedis intact bilat.   MSK: no deformity of the feet.  CV: no leg edema. Skin:  no ulcer on the feet.  normal color and temp on the feet. Neuro: sensation is intact to touch on the feet.   Lab Results  Component Value Date    HGBA1C 8.5* 04/15/2014      Assessment & Plan:  DM: moderate exacerbation. Noncompliance with cbg recording: I'll work around this as best I can Allergic cough, persistent.  Controlled substance refill request is declined.    Patient is advised the following: Patient Instructions  check your blood sugar 2 times a day.  vary the time of day when you check, between before the 3 meals, and at bedtime.  also check if you have symptoms of your blood sugar being too high or too low.  please keep a record of the readings and bring it to your next appointment here.  please call us sooner if your blood sugar goes below 70, or if you have a lot of readings over 200.   A diabetes blood test is requested for you today.  We'll contact you with results.  Please continue 35 units with breakfast, and 15 units with the evening meal.   Please come back for a follow-up appointment in 3 months.   As i work only on your diabetes, please ask Dr Larose Kells (or a doctor you see for that condition), for other prescriptions.

## 2014-04-16 ENCOUNTER — Telehealth: Payer: Self-pay | Admitting: Internal Medicine

## 2014-04-16 DIAGNOSIS — R05 Cough: Secondary | ICD-10-CM

## 2014-04-16 DIAGNOSIS — R059 Cough, unspecified: Secondary | ICD-10-CM

## 2014-04-16 MED ORDER — HYDROCODONE-HOMATROPINE 5-1.5 MG/5ML PO SYRP: 5.0000 mL | ORAL_SOLUTION | Freq: Two times a day (BID) | ORAL | Status: DC | PRN

## 2014-04-16 NOTE — Telephone Encounter (Signed)
Pt is requesting refill on Hydrocodone syrup.  Last OV: 03/26/2014 Last Fill: 03/26/2014 # 120 mL 0RF   Pt states she has been seen by Allergy.   Please advise.

## 2014-04-16 NOTE — Telephone Encounter (Signed)
Please print another prescription for 120 mL's.

## 2014-04-16 NOTE — Telephone Encounter (Signed)
Caller name: Hoorain Relation to pt: self Call back number: [phone] Pharmacy:  Reason for call:   Requesting hydrocodone. She states that she did go to the allergy clinic and that they are making her a formula for her. She is out and has the congestion and phlegm.

## 2014-04-16 NOTE — Telephone Encounter (Signed)
Rx printed, awaiting signature by Dr. [name].  

## 2014-04-16 NOTE — Telephone Encounter (Signed)
Spoke with Pt, informed Pt that Rx is ready for pick up at front desk. Pt verbalized understanding.

## 2014-04-18 ENCOUNTER — Ambulatory Visit: Payer: Medicare Other | Admitting: *Deleted

## 2014-04-19 DIAGNOSIS — J3089 Other allergic rhinitis: Secondary | ICD-10-CM | POA: Diagnosis not present

## 2014-04-22 DIAGNOSIS — J3089 Other allergic rhinitis: Secondary | ICD-10-CM | POA: Diagnosis not present

## 2014-04-24 DIAGNOSIS — J3089 Other allergic rhinitis: Secondary | ICD-10-CM | POA: Diagnosis not present

## 2014-04-25 ENCOUNTER — Telehealth: Payer: Self-pay | Admitting: Internal Medicine

## 2014-04-25 NOTE — Telephone Encounter (Signed)
Order form faxed back to [facility] at [phone]. Copies sent to scanning.

## 2014-04-25 NOTE — Telephone Encounter (Signed)
Patient requested a knee orthosis and sleeve. She has a history of DJD, will okay this request

## 2014-04-25 NOTE — Telephone Encounter (Signed)
Received fax confirmation 04/25/2014 at 15:26.

## 2014-04-26 DIAGNOSIS — J3089 Other allergic rhinitis: Secondary | ICD-10-CM | POA: Diagnosis not present

## 2014-04-29 DIAGNOSIS — J3089 Other allergic rhinitis: Secondary | ICD-10-CM | POA: Diagnosis not present

## 2014-05-01 DIAGNOSIS — J3089 Other allergic rhinitis: Secondary | ICD-10-CM | POA: Diagnosis not present

## 2014-05-03 DIAGNOSIS — J3089 Other allergic rhinitis: Secondary | ICD-10-CM | POA: Diagnosis not present

## 2014-05-06 DIAGNOSIS — J3089 Other allergic rhinitis: Secondary | ICD-10-CM | POA: Diagnosis not present

## 2014-05-08 DIAGNOSIS — J3089 Other allergic rhinitis: Secondary | ICD-10-CM | POA: Diagnosis not present

## 2014-05-10 DIAGNOSIS — J3089 Other allergic rhinitis: Secondary | ICD-10-CM | POA: Diagnosis not present

## 2014-05-13 DIAGNOSIS — J3089 Other allergic rhinitis: Secondary | ICD-10-CM | POA: Diagnosis not present

## 2014-05-15 DIAGNOSIS — J3089 Other allergic rhinitis: Secondary | ICD-10-CM | POA: Diagnosis not present

## 2014-05-20 DIAGNOSIS — J3089 Other allergic rhinitis: Secondary | ICD-10-CM | POA: Diagnosis not present

## 2014-05-20 DIAGNOSIS — J301 Allergic rhinitis due to pollen: Secondary | ICD-10-CM | POA: Diagnosis not present

## 2014-05-22 DIAGNOSIS — J3089 Other allergic rhinitis: Secondary | ICD-10-CM | POA: Diagnosis not present

## 2014-05-23 NOTE — Telephone Encounter (Signed)
error 

## 2014-05-27 DIAGNOSIS — J3089 Other allergic rhinitis: Secondary | ICD-10-CM | POA: Diagnosis not present

## 2014-05-29 DIAGNOSIS — J3089 Other allergic rhinitis: Secondary | ICD-10-CM | POA: Diagnosis not present

## 2014-06-03 DIAGNOSIS — J3089 Other allergic rhinitis: Secondary | ICD-10-CM | POA: Diagnosis not present

## 2014-06-05 DIAGNOSIS — J3089 Other allergic rhinitis: Secondary | ICD-10-CM | POA: Diagnosis not present

## 2014-06-10 DIAGNOSIS — J3089 Other allergic rhinitis: Secondary | ICD-10-CM | POA: Diagnosis not present

## 2014-06-12 ENCOUNTER — Other Ambulatory Visit: Payer: Self-pay | Admitting: Internal Medicine

## 2014-06-12 DIAGNOSIS — J3089 Other allergic rhinitis: Secondary | ICD-10-CM | POA: Diagnosis not present

## 2014-06-17 DIAGNOSIS — J3089 Other allergic rhinitis: Secondary | ICD-10-CM | POA: Diagnosis not present

## 2014-06-18 DIAGNOSIS — K1370 Unspecified lesions of oral mucosa: Secondary | ICD-10-CM | POA: Diagnosis not present

## 2014-06-24 ENCOUNTER — Encounter: Payer: Self-pay | Admitting: Internal Medicine

## 2014-06-24 ENCOUNTER — Ambulatory Visit (INDEPENDENT_AMBULATORY_CARE_PROVIDER_SITE_OTHER): Payer: Medicare Other | Admitting: Internal Medicine

## 2014-06-24 VITALS — BP 120/74 | HR 87 | Ht 64.0 in | Wt 231.4 lb

## 2014-06-24 DIAGNOSIS — J42 Unspecified chronic bronchitis: Secondary | ICD-10-CM | POA: Diagnosis not present

## 2014-06-24 DIAGNOSIS — J3089 Other allergic rhinitis: Secondary | ICD-10-CM | POA: Diagnosis not present

## 2014-06-24 DIAGNOSIS — J302 Other seasonal allergic rhinitis: Secondary | ICD-10-CM

## 2014-06-24 DIAGNOSIS — J309 Allergic rhinitis, unspecified: Secondary | ICD-10-CM | POA: Diagnosis not present

## 2014-06-24 DIAGNOSIS — J301 Allergic rhinitis due to pollen: Secondary | ICD-10-CM | POA: Diagnosis not present

## 2014-06-24 NOTE — Progress Notes (Signed)
Patient ID: Angel Green, female    DOB: 1940/04/13, 74 y.o.   MRN: [id number]  HPI 08/12/10 -31 yoF never smoker, seen at kind request of Dr Larose Kells. Complains that for 3 years she has had excessive phlegm/ congestion in chest and throat. this is present most of the time. Nasal sprays reduce the drainage. Feels that this condition makes her weak and washed out. Symptoms worse in Spring and with weather changes. Seemed better while she was being treated for a cold. Denies dysphagia or reflux but has been told she has GERD. Nasal surgery-? Septoplasty?in past. Allergy skin tested twice with little positive response. Denies asthma, skin rash or relation to meals. Astelin and Flonase used intermittently.  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 Retired from [employer] work. CXR- neg, NAD 06/30/10  09/17/10- 08/12/10 -16 yoF never smoker,  Followed for allergic rhinitis, bronchitis. Complains that for 3 years she has had excessive phlegm/ congestion in chest and throat. Last visit we gave Qvar- big help; allegra- not much help by itself. 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 Lemonade drink mix makes phlegm come.   12/18/10--70 yoF never smoker,  Followed for allergic rhinitis, bronchitis. Complains that for 3 years she has had excessive phlegm/ congestion in chest and throat. Had flu vaccine. No acute problems since last here and feels well. Minor sniffling just in the last day but no cough, chest pain or significant sinus discomfort. She continues Qvar 40.  10/15/11- -70 yoF never smoker,  Followed for allergic rhinitis, bronchitis.  New problem-urticaria/angioedema. Had a viral URI syndrome then woke August 2 with facial and tongue swelling, hard to talk. Involvement especially in left cheek and left side of tongue. ER visit questioned allergy and treated with Benadryl 50 mg plus Pepcid 40 mg which continue, and therefore day course of prednisone. Her URI symptoms have been treated with Mucinex  and cough syrup. Allergy Profile 08/13/2010-total IgE 21.4, negative for specific elevations.  11/30/11- 67 yoF never smoker,  Followed for allergic rhinitis, bronchitis, urticaria/angioedemathese No swelling in tongue or face since last visit Still postnasal drip which she treats with Astelin. Dry cough when she lies down.  05/30/12- 72 yoF never smoker,  Followed for allergic rhinitis, bronchitis. Follows For:  Denies excess swelling at this time - Seasonal allergy complaints - runny nose , itchy eyes, sneezing Likes Dymista nasal spray. Has had no more episodes of angioedema or hives. Off Allegra in the last few days has had increased itching and watery nose. Coughs while lying down. Does not feel reflux while taking Prilosec. CXR-06/16/11  IMPRESSION:  No evidence of acute cardiopulmonary disease.  Original Report Authenticated By: Julian Hy, M.D.  11/29/12- 64 yoF never smoker,  Followed for allergic rhinitis, bronchitis complicated by renal insuff, DM2, HBP FOLLOWS FOR:  Runny nose, itchy and watery eyes- due to season change x 2 days. Likes Dymista- just restarted.  06/15/13- 71 yoF never smoker,  Followed for allergic rhinitis, bronchitis complicated by renal insuff, DM2, HBP FOLLOWS FOR: Pt went to [facility] ER about 3 weeks ago for left sided sinus pain and pressure.  Pt c/o postnasal drip, congestion. Postnasal drip is now clear or light brown after Z-Pak and Dymista nasal spray. CT sinus 05/17/13 IMPRESSION:  No significant sinus disease. Small concha bullosa deformity left  middle nasal turbinate .  Electronically Signed  By: Marcello Moores Register  On: 05/17/2013 14:45 CXR 04/23/13 IMPRESSION:  Limited study by poor inspiration. Elevation of the left  hemidiaphragm. Streaky bilateral basilar atelectasis or infiltrate  left greater than right.  Electronically Signed  By: Lahoma Crocker M.D.  On: 04/18/2013 10:40  12/21/13-  38 yoF never smoker,  Followed for allergic rhinitis,  bronchitis complicated by renal insuff, DM2, HBP FOLLOWS FOR: Allergic rhinitis patient complains of excessive mucus. She coughs only at bedtime. Patient denies chest tightness, wheezing and sob. Complains that phlegm sits in the back of her throat -when lying down she coughs No cough in the daytime. We discussed losartan and ramipril as potentially causing cough . Continues Prilosec. We discussed reflux as a throat irritant giving sensation there is something in the back of the throat.  06/24/14- 54 yoF never smoker,  Followed for allergic rhinitis (allergy vaccine at [facility]), bronchitis complicated by renal insuff, DM2, HBP FOLLOWS FOR: Pt states she found out she is allergic to dust mites-currently on allergy vaccine-[place] [facility]. Our allergy profile 08/13/2010 at been negative. Has done a home environmental cleanup-carpets, etc. Feeling a lot better.  Review of Systems-HPI Constitutional:   No-   weight loss, night sweats, fevers, chills, fatigue, lassitude. HEENT:   No-  headaches, difficulty swallowing, tooth/dental problems, sore throat,       No-sneezing, itching, ear ache, +nasal congestion, + post nasal drip,  CV:  No-   chest pain, orthopnea, PND, swelling in lower extremities, anasarca, dizziness, palpitations Resp: No-   shortness of breath with exertion or at rest.             No-  productive cough,  + non-productive cough,  No- coughing up of blood.              No-   change in color of mucus.  No- wheezing.   Skin: No-   rash or lesions. GI:  No-   heartburn, indigestion, abdominal pain, nausea, vomiting,  GU:  MS:  No-   joint pain or swelling.   Neuro-     nothing unusual Psych:  No- change in mood or affect. No depression or anxiety.  No memory loss.   Objective:   Physical Exam  General- Alert, Oriented, Affect-appropriate, Distress- none acute    obese Skin- rash-none, lesions- none, excoriation- none Lymphadenopathy- none Head- atraumatic. No  facial asymmetry.             Eyes- Gross vision intact, PERRLA, conjunctivae clear secretions            Ears- Hearing, canals- normal            Nose- Clear, No-Septal dev, mucus, polyps, erosion, perforation             Throat- Mallampati II , mucosa clear , drainage- none, tonsils-                      atrophic Neck- flexible , trachea midline, no stridor , thyroid nl, carotid no bruit Chest - symmetrical excursion , unlabored           Heart/CV- RRR , no murmur , no gallop  , no rub, nl s1 s2                           - JVD- none , edema- none, stasis changes- none, varices- none           Lung- clear to P&A, wheeze- none, cough- none , dullness-none, rub- none           Chest  wall-  Abd-  Br/ Gen/ Rectal- Not done, not indicated Extrem- cyanosis- none, clubbing, none, atrophy- none, strength- nl Neuro- grossly intact to observation

## 2014-06-24 NOTE — Patient Instructions (Signed)
I am glad you are doing better. The allergy doctors at [facility] can manage your allergy problems. We will be happy to see you again here if needed.

## 2014-07-01 DIAGNOSIS — J301 Allergic rhinitis due to pollen: Secondary | ICD-10-CM | POA: Diagnosis not present

## 2014-07-01 DIAGNOSIS — J3089 Other allergic rhinitis: Secondary | ICD-10-CM | POA: Diagnosis not present

## 2014-07-07 NOTE — Assessment & Plan Note (Signed)
She has done environmental dust precautions at her home and this is probably the most effective intervention but she has also started allergy vaccine. I suggested that she doesn't need to be followed at 2 places and that we would be happy to see her again if we can be helpful.

## 2014-07-07 NOTE — Assessment & Plan Note (Signed)
Mild and currently well-controlled. Plan-we will be happy to see her again if we can be helpful.

## 2014-07-08 DIAGNOSIS — J301 Allergic rhinitis due to pollen: Secondary | ICD-10-CM | POA: Diagnosis not present

## 2014-07-08 DIAGNOSIS — J3089 Other allergic rhinitis: Secondary | ICD-10-CM | POA: Diagnosis not present

## 2014-07-08 DIAGNOSIS — K1370 Unspecified lesions of oral mucosa: Secondary | ICD-10-CM | POA: Diagnosis not present

## 2014-07-15 ENCOUNTER — Ambulatory Visit (INDEPENDENT_AMBULATORY_CARE_PROVIDER_SITE_OTHER): Payer: Medicare Other | Admitting: Endocrinology

## 2014-07-15 ENCOUNTER — Encounter: Payer: Self-pay | Admitting: Endocrinology

## 2014-07-15 VITALS — BP 136/84 | HR 97 | Temp 98.7°F | Ht 64.0 in | Wt 231.0 lb

## 2014-07-15 DIAGNOSIS — J3089 Other allergic rhinitis: Secondary | ICD-10-CM | POA: Diagnosis not present

## 2014-07-15 DIAGNOSIS — E1140 Type 2 diabetes mellitus with diabetic neuropathy, unspecified: Secondary | ICD-10-CM | POA: Diagnosis not present

## 2014-07-15 DIAGNOSIS — Z9114 Patient's other noncompliance with medication regimen: Secondary | ICD-10-CM

## 2014-07-15 DIAGNOSIS — J301 Allergic rhinitis due to pollen: Secondary | ICD-10-CM | POA: Diagnosis not present

## 2014-07-15 LAB — HEMOGLOBIN A1C: Hgb A1c MFr Bld: 8.7 % — ABNORMAL HIGH (ref 4.6–6.5)

## 2014-07-15 MED ORDER — INSULIN LISPRO PROT & LISPRO (75-25 MIX) 100 UNIT/ML KWIKPEN: PEN_INJECTOR | SUBCUTANEOUS | Status: DC

## 2014-07-15 NOTE — Progress Notes (Signed)
Subjective:    Patient ID: Angel Green, female    DOB: 29-May-1940, 74 y.o.   MRN: [id number]  HPI Pt returns for f/u of diabetes mellitus: DM type: Insulin-requiring type 2 Dx'ed: [id number] Complications: neuropathy of the lower extremities, and renal insufficiency.  Therapy: insulin since 2010. GDM: never. DKA: never Severe hypoglycemia: never Pancreatitis: never Other: therapy has been limited by variable cbg's; she changed to BID premixed insulin, after poor results with multiple daily injections.  Interval history:  she brings a record of her cbg's which i have reviewed today.  It varies from 122-208. There is no trend throughout the day.  pt states she feels well in general.  Pt says she never misses the insulin, but she did not increase insulin at last ov as advised.  pt states he feels well in general. Past Medical History  Diagnosis Date  . Hypertension   . Diabetes mellitus, type 2   . Hyperlipidemia   . Osteoarthritis   . Ankle pain     chronic  . Rhinitis     allergic nos  . RENAL INSUFFICIENCY, CHRONIC 04/16/2010    Past Surgical History  Procedure Laterality Date  . Cholecystectomy    . Partial hysterectomy      in her 68s  . Dental implants    . Cataract extraction      History   Social History  . Marital Status: Married    Spouse Name: N/A  . Number of Children: 2  . Years of Education: N/A   Occupational History  .  retired    Social History Main Topics  . Smoking status: Never Smoker   . Smokeless tobacco: Never Used  . Alcohol Use: No  . Drug Use: No  . Sexual Activity: Not on file   Other Topics Concern  . Not on file   Social History Narrative    husband is a Company secretary                Current Outpatient Prescriptions on File Prior to Visit  Medication Sig Dispense Refill  . amLODipine (NORVASC) 5 MG tablet TAKE 1 TABLET DAILY 90 tablet 2  . aspirin 81 MG tablet Take 81 mg by mouth daily.      Marland Kitchen atorvastatin (LIPITOR) 10 MG tablet  TAKE 1 TABLET DAILY 90 tablet 3  . cromolyn (NASALCROM) 5.2 MG/ACT nasal spray Place 1 spray into both nostrils 4 (four) times daily. 26 mL 12  . Cyanocobalamin (B-12 PO) Take 1 tablet by mouth daily.     Marland Kitchen EPINEPHrine 0.3 mg/0.3 mL IJ SOAJ injection Inject 0.3 mg into the muscle once.    . fluticasone (FLONASE) 50 MCG/ACT nasal spray USE 1 TO 2 SPRAYS IN EACH NOSTRIL DAILY 16 g 3  . fluticasone (FLOVENT HFA) 44 MCG/ACT inhaler Inhale 2 puffs into the lungs 2 (two) times daily. 3 Inhaler 3  . furosemide (LASIX) 20 MG tablet Take 40 mg by mouth daily.    Marland Kitchen loratadine (CLARITIN) 10 MG tablet Take 10 mg by mouth daily.    Marland Kitchen losartan (COZAAR) 50 MG tablet Take 1 tablet (50 mg total) by mouth daily. 90 tablet 1  . Multiple Vitamins-Minerals (CENTRUM SILVER PO) Take 1 tablet by mouth daily.      Marland Kitchen omeprazole (PRILOSEC) 20 MG capsule TAKE 1 CAPSULE DAILY 90 capsule 3  . Polyethyl Glycol-Propyl Glycol (SYSTANE) 0.4-0.3 % SOLN Apply 1 drop to eye daily as needed (for dry eyes).  No current facility-administered medications on file prior to visit.    No Known Allergies  Family History  Problem Relation Age of Onset  . Heart attack Neg Hx   . Colon cancer Neg Hx   . Breast cancer Neg Hx   . Stroke Neg Hx     BP 136/84 mmHg  Pulse 97  Temp(Src) 98.7 F (37.1 C) (Oral)  Ht 5\' 4"  (1.626 m)  Wt 231 lb (104.781 kg)  BMI 39.63 kg/m2  SpO2 95%    Review of Systems She denies hypoglycemia and weight change    Objective:   Physical Exam VITAL SIGNS:  See vs page GENERAL: no distress Pulses: dorsalis pedis intact bilat.   MSK: no deformity of the feet CV: no leg edema Skin:  no ulcer on the feet.  normal color and temp on the feet. Neuro: sensation is intact to touch on the feet, but decreased from normal.   Lab Results  Component Value Date   HGBA1C 8.7* 07/15/2014      Assessment & Plan:  DM: glycemic control is worse. Noncompliance with insulin dosing: we discussed the  importance of compliance.   Patient is advised the following: Patient Instructions  check your blood sugar 2 times a day.  vary the time of day when you check, between before the 3 meals, and at bedtime.  also check if you have symptoms of your blood sugar being too high or too low.  please keep a record of the readings and bring it to your next appointment here.  please call us sooner if your blood sugar goes below 70, or if you have a lot of readings over 200.   A diabetes blood test is requested for you today.  We'll contact you with results.  Please increase the insulin to 40 units with breakfast, and 20 units with the evening meal.   Please come back for a follow-up appointment in 3 months.

## 2014-07-15 NOTE — Patient Instructions (Addendum)
check your blood sugar 2 times a day.  vary the time of day when you check, between before the 3 meals, and at bedtime.  also check if you have symptoms of your blood sugar being too high or too low.  please keep a record of the readings and bring it to your next appointment here.  please call us sooner if your blood sugar goes below 70, or if you have a lot of readings over 200.   A diabetes blood test is requested for you today.  We'll contact you with results.  Please increase the insulin to 40 units with breakfast, and 20 units with the evening meal.   Please come back for a follow-up appointment in 3 months.

## 2014-07-23 DIAGNOSIS — J3089 Other allergic rhinitis: Secondary | ICD-10-CM | POA: Diagnosis not present

## 2014-07-23 DIAGNOSIS — J301 Allergic rhinitis due to pollen: Secondary | ICD-10-CM | POA: Diagnosis not present

## 2014-07-29 DIAGNOSIS — J3089 Other allergic rhinitis: Secondary | ICD-10-CM | POA: Diagnosis not present

## 2014-07-29 DIAGNOSIS — J301 Allergic rhinitis due to pollen: Secondary | ICD-10-CM | POA: Diagnosis not present

## 2014-07-31 DIAGNOSIS — N2581 Secondary hyperparathyroidism of renal origin: Secondary | ICD-10-CM | POA: Diagnosis not present

## 2014-07-31 DIAGNOSIS — I129 Hypertensive chronic kidney disease with stage 1 through stage 4 chronic kidney disease, or unspecified chronic kidney disease: Secondary | ICD-10-CM | POA: Diagnosis not present

## 2014-07-31 DIAGNOSIS — N189 Chronic kidney disease, unspecified: Secondary | ICD-10-CM | POA: Diagnosis not present

## 2014-07-31 DIAGNOSIS — D631 Anemia in chronic kidney disease: Secondary | ICD-10-CM | POA: Diagnosis not present

## 2014-07-31 DIAGNOSIS — N183 Chronic kidney disease, stage 3 (moderate): Secondary | ICD-10-CM | POA: Diagnosis not present

## 2014-08-05 DIAGNOSIS — J3089 Other allergic rhinitis: Secondary | ICD-10-CM | POA: Diagnosis not present

## 2014-08-07 DIAGNOSIS — J3089 Other allergic rhinitis: Secondary | ICD-10-CM | POA: Diagnosis not present

## 2014-08-12 DIAGNOSIS — J3089 Other allergic rhinitis: Secondary | ICD-10-CM | POA: Diagnosis not present

## 2014-08-14 DIAGNOSIS — J3089 Other allergic rhinitis: Secondary | ICD-10-CM | POA: Diagnosis not present

## 2014-08-19 ENCOUNTER — Other Ambulatory Visit: Payer: Self-pay

## 2014-08-19 DIAGNOSIS — J3089 Other allergic rhinitis: Secondary | ICD-10-CM | POA: Diagnosis not present

## 2014-08-27 DIAGNOSIS — J3089 Other allergic rhinitis: Secondary | ICD-10-CM | POA: Diagnosis not present

## 2014-09-03 DIAGNOSIS — J3089 Other allergic rhinitis: Secondary | ICD-10-CM | POA: Diagnosis not present

## 2014-09-10 DIAGNOSIS — J3089 Other allergic rhinitis: Secondary | ICD-10-CM | POA: Diagnosis not present

## 2014-09-17 DIAGNOSIS — J3089 Other allergic rhinitis: Secondary | ICD-10-CM | POA: Diagnosis not present

## 2014-09-24 DIAGNOSIS — J3089 Other allergic rhinitis: Secondary | ICD-10-CM | POA: Diagnosis not present

## 2014-09-24 DIAGNOSIS — J301 Allergic rhinitis due to pollen: Secondary | ICD-10-CM | POA: Diagnosis not present

## 2014-10-01 DIAGNOSIS — J3089 Other allergic rhinitis: Secondary | ICD-10-CM | POA: Diagnosis not present

## 2014-10-08 DIAGNOSIS — J3089 Other allergic rhinitis: Secondary | ICD-10-CM | POA: Diagnosis not present

## 2014-10-15 ENCOUNTER — Encounter: Payer: Self-pay | Admitting: Endocrinology

## 2014-10-15 ENCOUNTER — Ambulatory Visit (INDEPENDENT_AMBULATORY_CARE_PROVIDER_SITE_OTHER): Payer: Medicare Other | Admitting: Endocrinology

## 2014-10-15 VITALS — BP 136/88 | HR 94 | Temp 97.4°F | Ht 64.0 in | Wt 236.0 lb

## 2014-10-15 DIAGNOSIS — E669 Obesity, unspecified: Secondary | ICD-10-CM | POA: Diagnosis not present

## 2014-10-15 DIAGNOSIS — E1140 Type 2 diabetes mellitus with diabetic neuropathy, unspecified: Secondary | ICD-10-CM | POA: Diagnosis not present

## 2014-10-15 DIAGNOSIS — J3089 Other allergic rhinitis: Secondary | ICD-10-CM | POA: Diagnosis not present

## 2014-10-15 LAB — POCT GLYCOSYLATED HEMOGLOBIN (HGB A1C): HEMOGLOBIN A1C: 8.1

## 2014-10-15 MED ORDER — INSULIN LISPRO PROT & LISPRO (75-25 MIX) 100 UNIT/ML KWIKPEN: PEN_INJECTOR | SUBCUTANEOUS | Status: DC

## 2014-10-15 NOTE — Patient Instructions (Addendum)
check your blood sugar 2 times a day.  vary the time of day when you check, between before the 3 meals, and at bedtime.  also check if you have symptoms of your blood sugar being too high or too low.  please keep a record of the readings and bring it to your next appointment here.  please call us sooner if your blood sugar goes below 70, or if you have a lot of readings over 200.     Please increase the insulin to 50 units with breakfast, and 15 units with the evening meal.   On this type of insulin schedule, you should eat meals on a regular schedule.  If a meal is missed or significantly delayed, your blood sugar could go low. good diet significantly improves the control of your diabetes.  please let me know if you wish to be referred to a dietician.   Angel Green, RD is our dietician.  She is here all day Thursday and Friday.  She can advise you about a healthy diet.  She can also help you about a variety of special diabetes situations, such as shift work, Actor, gluten-free, diet for kidney patients, traveling with diabetes, and help for those who need to gain weight.   Please come back for a follow-up appointment in 3 months.

## 2014-10-15 NOTE — Progress Notes (Signed)
Subjective:    Patient ID: Angel Green, female    DOB: [birth year], 74 y.o.   MRN: [id number]  HPI Pt returns for f/u of diabetes mellitus: DM type: Insulin-requiring type 2 Dx'ed: Q000111Q Complications: neuropathy of the lower extremities, and renal insufficiency.  Therapy: insulin since 2010. GDM: never. DKA: never Severe hypoglycemia: never Pancreatitis: never Other: therapy has been limited by variable cbg's; she changed to BID premixed insulin, after poor results with multiple daily injections.  Interval history:  Pt says she never misses the insulin.  no cbg record, but states cbg's vary from 82-230.  It is in general higher as the day goes on. pt states she feels well in general.  Past Medical History  Diagnosis Date  . Hypertension   . Diabetes mellitus, type 2   . Hyperlipidemia   . Osteoarthritis   . Ankle pain     chronic  . Rhinitis     allergic nos  . RENAL INSUFFICIENCY, CHRONIC 04/16/2010    Past Surgical History  Procedure Laterality Date  . Cholecystectomy    . Partial hysterectomy      in her 59s  . Dental implants    . Cataract extraction      Social History   Social History  . Marital Status: Married    Spouse Name: N/A  . Number of Children: 2  . Years of Education: N/A   Occupational History  .  retired    Social History Main Topics  . Smoking status: Never Smoker   . Smokeless tobacco: Never Used  . Alcohol Use: No  . Drug Use: No  . Sexual Activity: Not on file   Other Topics Concern  . Not on file   Social History Narrative    husband is a Company secretary                Current Outpatient Prescriptions on File Prior to Visit  Medication Sig Dispense Refill  . amLODipine (NORVASC) 5 MG tablet TAKE 1 TABLET DAILY 90 tablet 2  . aspirin 81 MG tablet Take 81 mg by mouth daily.      Marland Kitchen atorvastatin (LIPITOR) 10 MG tablet TAKE 1 TABLET DAILY 90 tablet 3  . cromolyn (NASALCROM) 5.2 MG/ACT nasal spray Place 1 spray into both nostrils 4  (four) times daily. 26 mL 12  . Cyanocobalamin (B-12 PO) Take 1 tablet by mouth daily.     Marland Kitchen EPINEPHrine 0.3 mg/0.3 mL IJ SOAJ injection Inject 0.3 mg into the muscle once.    . fluticasone (FLONASE) 50 MCG/ACT nasal spray USE 1 TO 2 SPRAYS IN EACH NOSTRIL DAILY 16 g 3  . fluticasone (FLOVENT HFA) 44 MCG/ACT inhaler Inhale 2 puffs into the lungs 2 (two) times daily. 3 Inhaler 3  . furosemide (LASIX) 20 MG tablet Take 40 mg by mouth daily.    Marland Kitchen loratadine (CLARITIN) 10 MG tablet Take 10 mg by mouth daily.    Marland Kitchen losartan (COZAAR) 50 MG tablet Take 1 tablet (50 mg total) by mouth daily. 90 tablet 1  . Multiple Vitamins-Minerals (CENTRUM SILVER PO) Take 1 tablet by mouth daily.      Marland Kitchen omeprazole (PRILOSEC) 20 MG capsule TAKE 1 CAPSULE DAILY 90 capsule 3  . Polyethyl Glycol-Propyl Glycol (SYSTANE) 0.4-0.3 % SOLN Apply 1 drop to eye daily as needed (for dry eyes).     No current facility-administered medications on file prior to visit.    No Known Allergies  Family History  Problem Relation  Age of Onset  . Heart attack Neg Hx   . Colon cancer Neg Hx   . Breast cancer Neg Hx   . Stroke Neg Hx     BP 136/88 mmHg  Pulse 94  Temp(Src) 97.4 F (36.3 C) (Oral)  Ht 5\' 4"  (1.626 m)  Wt 236 lb (107.049 kg)  BMI 40.49 kg/m2  SpO2 97%  Review of Systems She denies hypoglycemia.  She has gained weight.      Objective:   Physical Exam VITAL SIGNS:  See vs page GENERAL: no distress Pulses: dorsalis pedis intact bilat.   MSK: no deformity of the feet CV: no leg edema Skin:  no ulcer on the feet.  normal color and temp on the feet. Neuro: sensation is intact to touch on the feet   A1c=8.1%    Assessment & Plan:  DM: she needs increased rx Obesity: worse.  Patient is advised the following: Patient Instructions  check your blood sugar 2 times a day.  vary the time of day when you check, between before the 3 meals, and at bedtime.  also check if you have symptoms of your blood sugar  being too high or too low.  please keep a record of the readings and bring it to your next appointment here.  please call us sooner if your blood sugar goes below 70, or if you have a lot of readings over 200.     Please increase the insulin to 50 units with breakfast, and 15 units with the evening meal.   On this type of insulin schedule, you should eat meals on a regular schedule.  If a meal is missed or significantly delayed, your blood sugar could go low. good diet significantly improves the control of your diabetes.  please let me know if you wish to be referred to a dietician.   Antonieta Iba, RD is our dietician.  She is here all day Thursday and Friday.  She can advise you about a healthy diet.  She can also help you about a variety of special diabetes situations, such as shift work, Actor, gluten-free, diet for kidney patients, traveling with diabetes, and help for those who need to gain weight.   Please come back for a follow-up appointment in 3 months.

## 2014-10-22 DIAGNOSIS — J3089 Other allergic rhinitis: Secondary | ICD-10-CM | POA: Diagnosis not present

## 2014-10-29 DIAGNOSIS — J3089 Other allergic rhinitis: Secondary | ICD-10-CM | POA: Diagnosis not present

## 2014-11-01 ENCOUNTER — Other Ambulatory Visit: Payer: Self-pay | Admitting: Internal Medicine

## 2014-11-05 DIAGNOSIS — J3089 Other allergic rhinitis: Secondary | ICD-10-CM | POA: Diagnosis not present

## 2014-11-06 ENCOUNTER — Other Ambulatory Visit: Payer: Self-pay | Admitting: Internal Medicine

## 2014-11-12 DIAGNOSIS — J3089 Other allergic rhinitis: Secondary | ICD-10-CM | POA: Diagnosis not present

## 2014-11-18 ENCOUNTER — Other Ambulatory Visit: Payer: Self-pay

## 2014-11-18 DIAGNOSIS — Z1231 Encounter for screening mammogram for malignant neoplasm of breast: Secondary | ICD-10-CM

## 2014-11-19 DIAGNOSIS — J3089 Other allergic rhinitis: Secondary | ICD-10-CM | POA: Diagnosis not present

## 2014-11-25 DIAGNOSIS — J3089 Other allergic rhinitis: Secondary | ICD-10-CM | POA: Diagnosis not present

## 2014-11-25 DIAGNOSIS — K219 Gastro-esophageal reflux disease without esophagitis: Secondary | ICD-10-CM | POA: Diagnosis not present

## 2014-11-25 DIAGNOSIS — R05 Cough: Secondary | ICD-10-CM | POA: Diagnosis not present

## 2014-11-28 ENCOUNTER — Other Ambulatory Visit: Payer: Self-pay

## 2014-12-03 DIAGNOSIS — J3089 Other allergic rhinitis: Secondary | ICD-10-CM | POA: Diagnosis not present

## 2014-12-03 DIAGNOSIS — Z23 Encounter for immunization: Secondary | ICD-10-CM | POA: Diagnosis not present

## 2014-12-05 ENCOUNTER — Other Ambulatory Visit: Payer: Self-pay | Admitting: Internal Medicine

## 2014-12-10 ENCOUNTER — Ambulatory Visit: Payer: Medicare Other

## 2014-12-10 DIAGNOSIS — J3089 Other allergic rhinitis: Secondary | ICD-10-CM | POA: Diagnosis not present

## 2014-12-12 DIAGNOSIS — J3089 Other allergic rhinitis: Secondary | ICD-10-CM | POA: Diagnosis not present

## 2014-12-17 DIAGNOSIS — J3089 Other allergic rhinitis: Secondary | ICD-10-CM | POA: Diagnosis not present

## 2014-12-18 ENCOUNTER — Telehealth: Payer: Self-pay | Admitting: Behavioral Health

## 2014-12-18 NOTE — Telephone Encounter (Signed)
Unable to reach patient at time of Pre-Visit Call.  Left message for patient to return call when available.    

## 2014-12-19 ENCOUNTER — Encounter: Payer: Medicare Other | Admitting: Internal Medicine

## 2014-12-19 ENCOUNTER — Other Ambulatory Visit: Payer: Self-pay

## 2014-12-19 ENCOUNTER — Telehealth: Payer: Self-pay | Admitting: Internal Medicine

## 2014-12-19 ENCOUNTER — Ambulatory Visit (INDEPENDENT_AMBULATORY_CARE_PROVIDER_SITE_OTHER): Payer: Medicare Other

## 2014-12-19 DIAGNOSIS — Z0000 Encounter for general adult medical examination without abnormal findings: Secondary | ICD-10-CM

## 2014-12-19 DIAGNOSIS — Z0289 Encounter for other administrative examinations: Secondary | ICD-10-CM

## 2014-12-19 NOTE — Patient Instructions (Addendum)
Follow up with Dr. Larose Kells as scheduled.   Schedule next Medicare Wellness visit for next year.    Your Goal:   Lose 20 lbs by next year and Increase physical activity:  Eat healthy diet (whole grains, lean protein, fruits and vegetables) and increase physical activity (walking).   Fat and Cholesterol Restricted Diet Getting too much fat and cholesterol in your diet may cause health problems. Following this diet helps keep your fat and cholesterol at normal levels. This can keep you from getting sick. WHAT TYPES OF FAT SHOULD I CHOOSE?  Choose monosaturated and polyunsaturated fats. These are found in foods such as olive oil, canola oil, flaxseeds, walnuts, almonds, and seeds.  Eat more omega-3 fats. Good choices include salmon, mackerel, sardines, tuna, flaxseed oil, and ground flaxseeds.  Limit saturated fats. These are in animal products such as meats, butter, and cream. They can also be in plant products such as palm oil, palm kernel oil, and coconut oil.   Avoid foods with partially hydrogenated oils in them. These contain trans fats. Examples of foods that have trans fats are stick margarine, some tub margarines, cookies, crackers, and other baked goods. WHAT GENERAL GUIDELINES DO I NEED TO FOLLOW?   Check food labels. Look for the words "trans fat" and "saturated fat."  When preparing a meal:  Fill half of your plate with vegetables and green salads.  Fill one fourth of your plate with whole grains. Look for the word "whole" as the first word in the ingredient list.  Fill one fourth of your plate with lean protein foods.  Limit fruit to two servings a day. Choose fruit instead of juice.  Eat more foods with soluble fiber. Examples of foods with this type of fiber are apples, broccoli, carrots, beans, peas, and barley. Try to get 20-30 g (grams) of fiber per day.  Eat more home-cooked foods. Eat less at restaurants and buffets.  Limit or avoid alcohol.  Limit foods high in  starch and sugar.  Limit fried foods.  Cook foods without frying them. Baking, boiling, grilling, and broiling are all great options.  Lose weight if you are overweight. Losing even a small amount of weight can help your overall health. It can also help prevent diseases such as diabetes and heart disease. WHAT FOODS CAN I EAT? Grains Whole grains, such as whole wheat or whole grain breads, crackers, cereals, and pasta. Unsweetened oatmeal, bulgur, barley, quinoa, or brown rice. Corn or whole wheat flour tortillas. Vegetables Fresh or frozen vegetables (raw, steamed, roasted, or grilled). Green salads. Fruits All fresh, canned (in natural juice), or frozen fruits. Meat and Other Protein Products Ground beef (85% or leaner), grass-fed beef, or beef trimmed of fat. Skinless chicken or [place]. Ground chicken or [place]. Pork trimmed of fat. All fish and seafood. Eggs. Dried beans, peas, or lentils. Unsalted nuts or seeds. Unsalted canned or dry beans. Dairy Low-fat dairy products, such as skim or 1% milk, 2% or reduced-fat cheeses, low-fat ricotta or cottage cheese, or plain low-fat yogurt. Fats and Oils Tub margarines without trans fats. Light or reduced-fat mayonnaise and salad dressings. Avocado. Olive, canola, sesame, or safflower oils. Natural peanut or almond butter (choose ones without added sugar and oil). The items listed above may not be a complete list of recommended foods or beverages. Contact your dietitian for more options. WHAT FOODS ARE NOT RECOMMENDED? Grains White bread. White pasta. White rice. Cornbread. Bagels, pastries, and croissants. Crackers that contain trans fat. Vegetables White potatoes.  Corn. Creamed or fried vegetables. Vegetables in a cheese sauce. Fruits Dried fruits. Canned fruit in light or heavy syrup. Fruit juice. Meat and Other Protein Products Fatty cuts of meat. Ribs, chicken wings, bacon, sausage, bologna, salami, chitterlings, fatback, hot dogs,  bratwurst, and packaged luncheon meats. Liver and organ meats. Dairy Whole or 2% milk, cream, half-and-half, and cream cheese. Whole milk cheeses. Whole-fat or sweetened yogurt. Full-fat cheeses. Nondairy creamers and whipped toppings. Processed cheese, cheese spreads, or cheese curds. Sweets and Desserts Corn syrup, sugars, honey, and molasses. Candy. Jam and jelly. Syrup. Sweetened cereals. Cookies, pies, cakes, donuts, muffins, and ice cream. Fats and Oils Butter, stick margarine, lard, shortening, ghee, or bacon fat. Coconut, palm kernel, or palm oils. Beverages Alcohol. Sweetened drinks (such as sodas, lemonade, and fruit drinks or punches). The items listed above may not be a complete list of foods and beverages to avoid. Contact your dietitian for more information.   This information is not intended to replace advice given to you by your health care provider. Make sure you discuss any questions you have with your health care provider.   Document Released: 08/10/2011 Document Revised: 03/01/2014 Document Reviewed: 05/10/2013 Elsevier Interactive Patient Education 2016 [place] Maintenance, Female Adopting a healthy lifestyle and getting preventive care can go a long way to promote health and wellness. Talk with your health care provider about what schedule of regular examinations is right for you. This is a good chance for you to check in with your provider about disease prevention and staying healthy. In between checkups, there are plenty of things you can do on your own. Experts have done a lot of research about which lifestyle changes and preventive measures are most likely to keep you healthy. Ask your health care provider for more information. WEIGHT AND DIET  Eat a healthy diet  Be sure to include plenty of vegetables, fruits, low-fat dairy products, and lean protein.  Do not eat a lot of foods high in solid fats, added sugars, or salt.  Get regular exercise. This  is one of the most important things you can do for your health.  Most adults should exercise for at least 150 minutes each week. The exercise should increase your heart rate and make you sweat (moderate-intensity exercise).  Most adults should also do strengthening exercises at least twice a week. This is in addition to the moderate-intensity exercise.  Maintain a healthy weight  Body mass index (BMI) is a measurement that can be used to identify possible weight problems. It estimates body fat based on height and weight. Your health care provider can help determine your BMI and help you achieve or maintain a healthy weight.  For females 24 years of age and older:   A BMI below 18.5 is considered underweight.  A BMI of 18.5 to 24.9 is normal.  A BMI of 25 to 29.9 is considered overweight.  A BMI of 30 and above is considered obese.  Watch levels of cholesterol and blood lipids  You should start having your blood tested for lipids and cholesterol at 76 years of age, then have this test every 5 years.  You may need to have your cholesterol levels checked more often if:  Your lipid or cholesterol levels are high.  You are older than 76 years of age.  You are at high risk for heart disease.  CANCER SCREENING   Lung Cancer  Lung cancer screening is recommended for adults 66-74 years old who are  at high risk for lung cancer because of a history of smoking.  A yearly low-dose CT scan of the lungs is recommended for people who:  Currently smoke.  Have quit within the past 15 years.  Have at least a 30-pack-year history of smoking. A pack year is smoking an average of one pack of cigarettes a day for 1 year.  Yearly screening should continue until it has been 15 years since you quit.  Yearly screening should stop if you develop a health problem that would prevent you from having lung cancer treatment.  Breast Cancer  Practice breast self-awareness. This means understanding  how your breasts normally appear and feel.  It also means doing regular breast self-exams. Let your health care provider know about any changes, no matter how small.  If you are in your 20s or 30s, you should have a clinical breast exam (CBE) by a health care provider every 1-3 years as part of a regular health exam.  If you are 60 or older, have a CBE every year. Also consider having a breast X-ray (mammogram) every year.  If you have a family history of breast cancer, talk to your health care provider about genetic screening.  If you are at high risk for breast cancer, talk to your health care provider about having an MRI and a mammogram every year.  Breast cancer gene (BRCA) assessment is recommended for women who have family members with BRCA-related cancers. BRCA-related cancers include:  Breast.  Ovarian.  Tubal.  Peritoneal cancers.  Results of the assessment will determine the need for genetic counseling and BRCA1 and BRCA2 testing. Cervical Cancer Your health care provider may recommend that you be screened regularly for cancer of the pelvic organs (ovaries, uterus, and vagina). This screening involves a pelvic examination, including checking for microscopic changes to the surface of your cervix (Pap test). You may be encouraged to have this screening done every 3 years, beginning at age 47.  For women ages 58-65, health care providers may recommend pelvic exams and Pap testing every 3 years, or they may recommend the Pap and pelvic exam, combined with testing for human papilloma virus (HPV), every 5 years. Some types of HPV increase your risk of cervical cancer. Testing for HPV may also be done on women of any age with unclear Pap test results.  Other health care providers may not recommend any screening for nonpregnant women who are considered low risk for pelvic cancer and who do not have symptoms. Ask your health care provider if a screening pelvic exam is right for  you.  If you have had past treatment for cervical cancer or a condition that could lead to cancer, you need Pap tests and screening for cancer for at least 20 years after your treatment. If Pap tests have been discontinued, your risk factors (such as having a new sexual partner) need to be reassessed to determine if screening should resume. Some women have medical problems that increase the chance of getting cervical cancer. In these cases, your health care provider may recommend more frequent screening and Pap tests. Colorectal Cancer  This type of cancer can be detected and often prevented.  Routine colorectal cancer screening usually begins at 76 years of age and continues through 76 years of age.  Your health care provider may recommend screening at an earlier age if you have risk factors for colon cancer.  Your health care provider may also recommend using home test kits to check for hidden  blood in the stool.  A small camera at the end of a tube can be used to examine your colon directly (sigmoidoscopy or colonoscopy). This is done to check for the earliest forms of colorectal cancer.  Routine screening usually begins at age 7.  Direct examination of the colon should be repeated every 5-10 years through 76 years of age. However, you may need to be screened more often if early forms of precancerous polyps or small growths are found. Skin Cancer  Check your skin from head to toe regularly.  Tell your health care provider about any new moles or changes in moles, especially if there is a change in a mole's shape or color.  Also tell your health care provider if you have a mole that is larger than the size of a pencil eraser.  Always use sunscreen. Apply sunscreen liberally and repeatedly throughout the day.  Protect yourself by wearing long sleeves, pants, a wide-brimmed hat, and sunglasses whenever you are outside. HEART DISEASE, DIABETES, AND HIGH BLOOD PRESSURE   High blood  pressure causes heart disease and increases the risk of stroke. High blood pressure is more likely to develop in:  People who have blood pressure in the high end of the normal range (130-139/85-89 mm Hg).  People who are overweight or obese.  People who are African American.  If you are 71-70 years of age, have your blood pressure checked every 3-5 years. If you are 38 years of age or older, have your blood pressure checked every year. You should have your blood pressure measured twice--once when you are at a hospital or clinic, and once when you are not at a hospital or clinic. Record the average of the two measurements. To check your blood pressure when you are not at a hospital or clinic, you can use:  An automated blood pressure machine at a pharmacy.  A home blood pressure monitor.  If you are between 48 years and 48 years old, ask your health care provider if you should take aspirin to prevent strokes.  Have regular diabetes screenings. This involves taking a blood sample to check your fasting blood sugar level.  If you are at a normal weight and have a low risk for diabetes, have this test once every three years after 76 years of age.  If you are overweight and have a high risk for diabetes, consider being tested at a younger age or more often. PREVENTING INFECTION  Hepatitis B  If you have a higher risk for hepatitis B, you should be screened for this virus. You are considered at high risk for hepatitis B if:  You were born in a country where hepatitis B is common. Ask your health care provider which countries are considered high risk.  Your parents were born in a high-risk country, and you have not been immunized against hepatitis B (hepatitis B vaccine).  You have HIV or AIDS.  You use needles to inject street drugs.  You live with someone who has hepatitis B.  You have had sex with someone who has hepatitis B.  You get hemodialysis treatment.  You take certain  medicines for conditions, including cancer, organ transplantation, and autoimmune conditions. Hepatitis C  Blood testing is recommended for:  Everyone born from [birth year] through 1967.  Anyone with known risk factors for hepatitis C. Sexually transmitted infections (STIs)  You should be screened for sexually transmitted infections (STIs) including gonorrhea and chlamydia if:  You are sexually active and are  younger than 75 years of age.  You are older than 76 years of age and your health care provider tells you that you are at risk for this type of infection.  Your sexual activity has changed since you were last screened and you are at an increased risk for chlamydia or gonorrhea. Ask your health care provider if you are at risk.  If you do not have HIV, but are at risk, it may be recommended that you take a prescription medicine daily to prevent HIV infection. This is called pre-exposure prophylaxis (PrEP). You are considered at risk if:  You are sexually active and do not regularly use condoms or know the HIV status of your partner(s).  You take drugs by injection.  You are sexually active with a partner who has HIV. Talk with your health care provider about whether you are at high risk of being infected with HIV. If you choose to begin PrEP, you should first be tested for HIV. You should then be tested every 3 months for as long as you are taking PrEP.  PREGNANCY   If you are premenopausal and you may become pregnant, ask your health care provider about preconception counseling.  If you may become pregnant, take 400 to 800 micrograms (mcg) of folic acid every day.  If you want to prevent pregnancy, talk to your health care provider about birth control (contraception). OSTEOPOROSIS AND MENOPAUSE   Osteoporosis is a disease in which the bones lose minerals and strength with aging. This can result in serious bone fractures. Your risk for osteoporosis can be identified using a bone  density scan.  If you are 39 years of age or older, or if you are at risk for osteoporosis and fractures, ask your health care provider if you should be screened.  Ask your health care provider whether you should take a calcium or vitamin D supplement to lower your risk for osteoporosis.  Menopause may have certain physical symptoms and risks.  Hormone replacement therapy may reduce some of these symptoms and risks. Talk to your health care provider about whether hormone replacement therapy is right for you.  HOME CARE INSTRUCTIONS   Schedule regular health, dental, and eye exams.  Stay current with your immunizations.   Do not use any tobacco products including cigarettes, chewing tobacco, or electronic cigarettes.  If you are pregnant, do not drink alcohol.  If you are breastfeeding, limit how much and how often you drink alcohol.  Limit alcohol intake to no more than 1 drink per day for nonpregnant women. One drink equals 12 ounces of beer, 5 ounces of wine, or 1 ounces of hard liquor.  Do not use street drugs.  Do not share needles.  Ask your health care provider for help if you need support or information about quitting drugs.  Tell your health care provider if you often feel depressed.  Tell your health care provider if you have ever been abused or do not feel safe at home.   This information is not intended to replace advice given to you by your health care provider. Make sure you discuss any questions you have with your health care provider.   Document Released: 08/24/2010 Document Revised: 03/01/2014 Document Reviewed: 01/10/2013 Elsevier Interactive Patient Education [employer].

## 2014-12-19 NOTE — Progress Notes (Signed)
Pre visit review using our clinic review tool, if applicable. No additional management support is needed unless otherwise documented below in the visit note. 

## 2014-12-19 NOTE — Progress Notes (Addendum)
Subjective:   Angel Green is a 74 y.o. female who presents for Medicare Annual (Subsequent) preventive examination.  Review of Systems: No ROS  Cardiac Risk Factors include: advanced age (>42men, >79 women);diabetes mellitus;hypertension;sedentary lifestyle;obesity (BMI >30kg/m2)     Sleep patterns:   Sleeps at least 8 hours for night/gets up occasionally to use bathroom at night. Has night lights in hallways. Home Safety/Smoke Alarms: Feels safe at home.  Lives with husband in two story home.  Does not plan to move out of home.  Smoke alarms and alarm system present.   Firearm Safety:  Kept in safe place.   Seat Belt Safety/Bike Helmet:  Always wears seat belt.    Counseling:   Eye Exam- 03/08/14--Dr. [facility]- Last exam-yesterday (12/17/13) Female:  Pap-Partial Hysterectomy-still has ovaries     Mammo-11/29/13-negative      Dexa scan-01/02/14       CCS- states goes every 5 years: Dr. Collene Mares- Last CCS was normal.    Immunization: Received Flu Shot- 2 weeks ago.      Objective:     Vitals: BP 150/80 mmHg  Pulse 94  Ht 5\' 5"  (1.651 m)  Wt 232 lb (105.235 kg)  BMI 38.61 kg/m2  SpO2 97%  Tobacco History  Smoking status  . Never Smoker   Smokeless tobacco  . Never Used     Counseling given: Yes   Past Medical History  Diagnosis Date  . Hypertension   . Diabetes mellitus, type 2 ([place])   . Hyperlipidemia   . Osteoarthritis   . Ankle pain     chronic  . Rhinitis     allergic nos  . RENAL INSUFFICIENCY, CHRONIC 04/16/2010  . Allergy     Dust Mites   Past Surgical History  Procedure Laterality Date  . Cholecystectomy    . Partial hysterectomy      in her 65s  . Dental implants    . Cataract extraction     Family History  Problem Relation Age of Onset  . Heart attack Neg Hx   . Colon cancer Neg Hx   . Breast cancer Neg Hx   . Stroke Neg Hx   . Healthy Brother   . Healthy Son   . Healthy Son    History  Sexual Activity  . Sexual Activity:  .  Partners: Male    Outpatient Encounter Prescriptions as of 12/19/2014  Medication Sig  . amLODipine (NORVASC) 5 MG tablet Take 1 tablet (5 mg total) by mouth daily.  Marland Kitchen aspirin 81 MG tablet Take 81 mg by mouth daily.    Marland Kitchen atorvastatin (LIPITOR) 10 MG tablet Take 1 tablet (10 mg total) by mouth daily.  . cromolyn (NASALCROM) 5.2 MG/ACT nasal spray Place 1 spray into both nostrils 4 (four) times daily.  . Cyanocobalamin (B-12 PO) Take 1 tablet by mouth daily.   Marland Kitchen EPINEPHrine 0.3 mg/0.3 mL IJ SOAJ injection Inject 0.3 mg into the muscle once.  . fluticasone (FLONASE) 50 MCG/ACT nasal spray USE 1 TO 2 SPRAYS IN EACH NOSTRIL DAILY  . fluticasone (FLOVENT HFA) 44 MCG/ACT inhaler Inhale 2 puffs into the lungs 2 (two) times daily.  . furosemide (LASIX) 20 MG tablet Take 2 tablets (40 mg total) by mouth daily.  . Insulin Lispro Prot & Lispro (HUMALOG MIX 75/25 KWIKPEN) (75-25) 100 UNIT/ML Kwikpen 50 units with breakfast, and 15 units with the evening meal, and pen needles 2/day  . loratadine (CLARITIN) 10 MG tablet Take 10 mg by  mouth daily.  Marland Kitchen losartan (COZAAR) 50 MG tablet Take 1 tablet (50 mg total) by mouth daily.  . Multiple Vitamins-Minerals (CENTRUM SILVER PO) Take 1 tablet by mouth daily.    Marland Kitchen omeprazole (PRILOSEC) 20 MG capsule Take 1 capsule (20 mg total) by mouth daily.  Vladimir Faster Glycol-Propyl Glycol (SYSTANE) 0.4-0.3 % SOLN Apply 1 drop to eye daily as needed (for dry eyes).  . SURE COMFORT PEN NEEDLES 31G X 5 MM MISC Use as directed.   No facility-administered encounter medications on file as of 12/19/2014.    Activities of Daily Living In your present state of health, do you have any difficulty performing the following activities: 12/19/2014  Hearing? N  Vision? N  Difficulty concentrating or making decisions? Y  Walking or climbing stairs? Y  Dressing or bathing? N  Doing errands, shopping? N  Preparing Food and eating ? N  Using the Toilet? N  In the past six months, have  you accidently leaked urine? N  Do you have problems with loss of bowel control? Y  Managing your Medications? N  Managing your Finances? N  Housekeeping or managing your Housekeeping? N    Patient Care Team: Colon Branch, MD as PCP - General Renato Shin, MD as Consulting Physician (Endocrinology) Donato Heinz, MD as Consulting Physician (Nephrology) Tiajuana Amass, MD as Referring Physician (Allergy and Immunology) Juanita Craver, MD as Consulting Physician (Gastroenterology)    Assessment:  Diabetes Mellitus with neuropathy- Last A1C: 8.1.  Pt states home BS range in the upper 90's fasting, as high as 200 later in the day.  Takes Humalog insulin twice a day.  Encouraged healthy diet, limiting simple carbs and increase physical activity. Followed by Dr. Loanne Drilling.  Hypertension- On amlodipine, lasix, and losartan.  BP elevated today.  Pt has taken BP meds today.  Encouraged patient to follow up with Dr. Larose Kells.  Appt scheduled.  BMI- 38.61 kg/m2.  Discussed importance of a healthy diet and exercise.  Pt voiced a goal of losing 20 lbs by this time next year.    Lower extremity ulcer- No evidence of ulcer noted.    Exercise Activities and Dietary recommendations Current Exercise Habits:: The patient does not participate in regular exercise at present (She has a treadmill at home )   Diet: Husband and patient mostly cook meals.  Eats 2-3 meals per day.  Breakfast:  Light breakfast (boiled eggs, toast, 1 strip of bacon, coffee); Lunch-salads, chicken, hamburger, kale, loves greens; Dinner- Baked/grilled chicken, steak on the grill, mac and cheese, soups, stews, liver and onions with rice.  Ice cream for dessert.  Drinks-water, lemonade, and at least 2 cups of coffee per day.   Goals    . Increase physical activity     Walking- walking the [name] and Treadmill    . Lose 20 lbs by next year.        Fall Risk Fall Risk  12/19/2014 12/18/2013 10/17/2013 12/15/2012 12/15/2012  Falls in the past  year? No No No Yes No  Number falls in past yr: - - - 1 -  Injury with Fall? - - - Yes -   Depression Screen PHQ 2/9 Scores 12/19/2014 12/18/2013 10/17/2013 12/15/2012  PHQ - 2 Score 0 0 0 0     Cognitive Testing MMSE - Mini Mental State Exam 12/19/2014  Orientation to time 5  Orientation to Place 5  Registration 2  Attention/ Calculation 5  Recall 1  Language- name 2 objects 2  Language-  repeat 1  Language- follow 3 step command 3  Language- read & follow direction 1  Write a sentence 1  Copy design 1  Total score 27    Immunization History  Administered Date(s) Administered  . Influenza Split 12/02/2011, 12/21/2013  . Influenza Whole 12/21/2006, 11/28/2007, 11/22/2008, 11/23/2010  . Influenza, High Dose Seasonal PF 11/29/2012  . Influenza-Unspecified 12/04/2010, 12/18/2013, 12/05/2014  . Pneumococcal Conjugate-13 12/18/2013  . Pneumococcal Polysaccharide-23 11/14/2007  . Td 11/14/2007   Screening Tests Health Maintenance  Topic Date Due  . OPHTHALMOLOGY EXAM  03/09/2015  . HEMOGLOBIN A1C  04/17/2015  . INFLUENZA VACCINE  09/23/2015  . FOOT EXAM  10/15/2015  . MAMMOGRAM  11/30/2015  . TETANUS/TDAP  11/13/2017  . DEXA SCAN  Completed  . ZOSTAVAX  Addressed  . PNA vac Low Risk Adult  Completed      Plan:  Follow up with Dr. Larose Kells as scheduled.   Schedule next Medicare Wellness visit for next year.    Your Goal:   Lose 20 lbs by next year and increase physical activity:  Eat healthy diet (whole grains, lean protein, fruits and vegetables) and increase physical activity (walking).   During the course of the visit the patient was educated and counseled about the following appropriate screening and preventive services:   Vaccines to include Pneumoccal, Influenza, Hepatitis B, Td, Zostavax, HCV  Electrocardiogram  Cardiovascular Disease  Colorectal cancer screening  Bone density screening  Diabetes screening  Glaucoma  screening  Mammography/PAP  Nutrition counseling   Patient Instructions (the written plan) was given to the patient.   Rudene Anda, RN  12/19/2014

## 2014-12-25 DIAGNOSIS — J3089 Other allergic rhinitis: Secondary | ICD-10-CM | POA: Diagnosis not present

## 2014-12-26 NOTE — Telephone Encounter (Signed)
Pt was marked no show for 12/19/14 10:00am, pt came in late at 10:24am, pt completed [facility] Wellness with Ashlee at 11:00am and scheduled f/u 12/27/14 with Dr. Larose Kells, charge or no charge for 12/19/14 cpe appt?

## 2014-12-27 ENCOUNTER — Ambulatory Visit (INDEPENDENT_AMBULATORY_CARE_PROVIDER_SITE_OTHER): Payer: Medicare Other | Admitting: Internal Medicine

## 2014-12-27 ENCOUNTER — Encounter: Payer: Self-pay | Admitting: Internal Medicine

## 2014-12-27 VITALS — BP 124/76 | HR 86 | Temp 97.6°F | Ht 65.0 in | Wt 229.2 lb

## 2014-12-27 DIAGNOSIS — E1140 Type 2 diabetes mellitus with diabetic neuropathy, unspecified: Secondary | ICD-10-CM | POA: Diagnosis not present

## 2014-12-27 DIAGNOSIS — Z0000 Encounter for general adult medical examination without abnormal findings: Secondary | ICD-10-CM

## 2014-12-27 DIAGNOSIS — E785 Hyperlipidemia, unspecified: Secondary | ICD-10-CM | POA: Diagnosis not present

## 2014-12-27 DIAGNOSIS — I10 Essential (primary) hypertension: Secondary | ICD-10-CM

## 2014-12-27 DIAGNOSIS — Z09 Encounter for follow-up examination after completed treatment for conditions other than malignant neoplasm: Secondary | ICD-10-CM

## 2014-12-27 LAB — LIPID PANEL
CHOL/HDL RATIO: 3
CHOLESTEROL: 131 mg/dL (ref 0–200)
HDL: 48.4 mg/dL (ref >39.00)
LDL CALC: 70 mg/dL (ref 0–99)
NonHDL: 82.74
Triglycerides: 62 mg/dL (ref 0.0–149.0)
VLDL: 12.4 mg/dL (ref 0.0–40.0)

## 2014-12-27 LAB — AST: AST: 24 U/L (ref 0–37)

## 2014-12-27 LAB — BASIC METABOLIC PANEL
BUN: 16 mg/dL (ref 6–23)
CHLORIDE: 105 meq/L (ref 96–112)
CO2: 33 meq/L — AB (ref 19–32)
CREATININE: 1.39 mg/dL — AB (ref 0.40–1.20)
Calcium: 9.9 mg/dL (ref 8.4–10.5)
GFR: 47.31 mL/min — ABNORMAL LOW (ref >60.00)
Glucose, Bld: 81 mg/dL (ref 70–99)
Potassium: 4 mEq/L (ref 3.5–5.1)
SODIUM: 144 meq/L (ref 135–145)

## 2014-12-27 LAB — HM DIABETES FOOT EXAM: HM DIABETIC FOOT EXAM: ABNORMAL

## 2014-12-27 LAB — ALT: ALT: 18 U/L (ref 0–35)

## 2014-12-27 NOTE — Patient Instructions (Signed)
Get your blood work before you leave      Next visit  for a routine checkup in 6 months   (30 minutes). No fasting Please schedule an appointment at the front desk      Diabetes and Foot Care Diabetes may cause you to have problems because of poor blood supply (circulation) to your feet and legs. This may cause the skin on your feet to become thinner, break easier, and heal more slowly. Your skin may become dry, and the skin may peel and crack. You may also have nerve damage in your legs and feet causing decreased feeling in them. You may not notice minor injuries to your feet that could lead to infections or more serious problems. Taking care of your feet is one of the most important things you can do for yourself.  HOME CARE INSTRUCTIONS  Wear shoes at all times, even in the house. Do not go barefoot. Bare feet are easily injured.  Check your feet daily for blisters, cuts, and redness. If you cannot see the bottom of your feet, use a mirror or ask someone for help.  Wash your feet with warm water (do not use hot water) and mild soap. Then pat your feet and the areas between your toes until they are completely dry. Do not soak your feet as this can dry your skin.  Apply a moisturizing lotion or petroleum jelly (that does not contain alcohol and is unscented) to the skin on your feet and to dry, brittle toenails. Do not apply lotion between your toes.  Trim your toenails straight across. Do not dig under them or around the cuticle. File the edges of your nails with an emery board or nail file.  Do not cut corns or calluses or try to remove them with medicine.  Wear clean socks or stockings every day. Make sure they are not too tight. Do not wear knee-high stockings since they may decrease blood flow to your legs.  Wear shoes that fit properly and have enough cushioning. To break in new shoes, wear them for just a few hours a day. This prevents you from injuring your feet. Always look in  your shoes before you put them on to be sure there are no objects inside.  Do not cross your legs. This may decrease the blood flow to your feet.  If you find a minor scrape, cut, or break in the skin on your feet, keep it and the skin around it clean and dry. These areas may be cleansed with mild soap and water. Do not cleanse the area with peroxide, alcohol, or iodine.  When you remove an adhesive bandage, be sure not to damage the skin around it.  If you have a wound, look at it several times a day to make sure it is healing.  Do not use heating pads or hot water bottles. They may burn your skin. If you have lost feeling in your feet or legs, you may not know it is happening until it is too late.  Make sure your health care provider performs a complete foot exam at least annually or more often if you have foot problems. Report any cuts, sores, or bruises to your health care provider immediately. SEEK MEDICAL CARE IF:   You have an injury that is not healing.  You have cuts or breaks in the skin.  You have an ingrown nail.  You notice redness on your legs or feet.  You feel burning or  tingling in your legs or feet.  You have pain or cramps in your legs and feet.  Your legs or feet are numb.  Your feet always feel cold. SEEK IMMEDIATE MEDICAL CARE IF:   There is increasing redness, swelling, or pain in or around a wound.  There is a red line that goes up your leg.  Pus is coming from a wound.  You develop a fever or as directed by your health care provider.  You notice a bad smell coming from an ulcer or wound.   This information is not intended to replace advice given to you by your health care provider. Make sure you discuss any questions you have with your health care provider.   Document Released: 02/06/2000 Document Revised: 10/11/2012 Document Reviewed: 07/18/2012 Elsevier Interactive Patient Education [employer].

## 2014-12-27 NOTE — Telephone Encounter (Signed)
No charge. 

## 2014-12-27 NOTE — Progress Notes (Signed)
Subjective:    Patient ID: Angel Green, female    DOB: [birth year], 74 y.o.   MRN: [id number]  DOS:  12/27/2014 Type of visit - description : Routine office visit Interval history:  doing very well, feels great. Dyslipidemia: Good compliance of medication, no apparent side effects DM: Under the care of Dr. Loanne Drilling, good compliance with medications HTN: BP today is very good, she checks her BPs regularly at home and they are normal. DJD: Currently with no pain at all    Review of Systems Denies chest pain or difficulty breathing No nausea, vomiting, diarrhea or blood in the stools. No lower extremity paresthesias  Past Medical History  Diagnosis Date  . Hypertension   . Diabetes mellitus, type 2 ([place])   . Hyperlipidemia   . Osteoarthritis   . Ankle pain     chronic  . Rhinitis     allergic nos  . RENAL INSUFFICIENCY, CHRONIC 04/16/2010  . Allergy     Dust Mites    Past Surgical History  Procedure Laterality Date  . Cholecystectomy    . Partial hysterectomy      in her 89s  . Dental implants    . Cataract extraction      Social History   Social History  . Marital Status: Married    Spouse Name: N/A  . Number of Children: 2  . Years of Education: N/A   Occupational History  .  retired    Social History Main Topics  . Smoking status: Never Smoker   . Smokeless tobacco: Never Used  . Alcohol Use: No  . Drug Use: No  . Sexual Activity:    Partners: Male   Other Topics Concern  . Not on file   Social History Narrative    husband is a Company secretary                    Medication List       This list is accurate as of: 12/27/14 11:59 PM.  Always use your most recent med list.               amLODipine 5 MG tablet  Commonly known as:  NORVASC  Take 1 tablet (5 mg total) by mouth daily.     aspirin 81 MG tablet  Take 81 mg by mouth daily.     atorvastatin 10 MG tablet  Commonly known as:  LIPITOR  Take 1 tablet (10 mg total) by mouth daily.       B-12 PO  Take 1 tablet by mouth daily.     CENTRUM SILVER PO  Take 1 tablet by mouth daily.     cromolyn 5.2 MG/ACT nasal spray  Commonly known as:  NASALCROM  Place 1 spray into both nostrils 4 (four) times daily.     EPINEPHrine 0.3 mg/0.3 mL Soaj injection  Commonly known as:  EPI-PEN  Inject 0.3 mg into the muscle once.     fluticasone 44 MCG/ACT inhaler  Commonly known as:  FLOVENT HFA  Inhale 2 puffs into the lungs 2 (two) times daily.     fluticasone 50 MCG/ACT nasal spray  Commonly known as:  FLONASE  USE 1 TO 2 SPRAYS IN EACH NOSTRIL DAILY     furosemide 20 MG tablet  Commonly known as:  LASIX  Take 2 tablets (40 mg total) by mouth daily.     Insulin Lispro Prot & Lispro (75-25) 100 UNIT/ML Kwikpen  Commonly known as:  HUMALOG MIX 75/25 KWIKPEN  50 units with breakfast, and 15 units with the evening meal, and pen needles 2/day     loratadine 10 MG tablet  Commonly known as:  CLARITIN  Take 10 mg by mouth daily.     losartan 50 MG tablet  Commonly known as:  COZAAR  Take 1 tablet (50 mg total) by mouth daily.     omeprazole 20 MG capsule  Commonly known as:  PRILOSEC  Take 1 capsule (20 mg total) by mouth daily.     SURE COMFORT PEN NEEDLES 31G X 5 MM Misc  Generic drug:  Insulin Pen Needle  Use as directed.     SYSTANE 0.4-0.3 % Soln  Generic drug:  Polyethyl Glycol-Propyl Glycol  Apply 1 drop to eye daily as needed (for dry eyes).           Objective:   Physical Exam BP 124/76 mmHg  Pulse 86  Temp(Src) 97.6 F (36.4 C) (Oral)  Ht 5\' 5"  (1.651 m)  Wt 229 lb 4 oz (103.987 kg)  BMI 38.15 kg/m2  SpO2 98% General:   Well developed, well nourished . NAD.  HEENT:  Normocephalic . Face symmetric, atraumatic Lungs:  CTA B Normal respiratory effort, no intercostal retractions, no accessory muscle use. Heart: RRR,  no murmur.  No pretibial edema bilaterally  Diabetic feet exam: No edema, normal pedal pulses, pinprick examination with  patchy decrease sensitivity distally, worse on the left. Neurologic:  alert & oriented X3.  Speech normal, gait appropriate for age and unassisted Psych--  Cognition and judgment appear intact.  Cooperative with normal attention span and concentration.  Behavior appropriate. No anxious or depressed appearing.      Assessment & Plan:   Assessment>  DM  Dr Loanne Drilling + Neuropathy: Per foot exam 12-2014 HTN ---change ACE to ARB is 03/2014 Hyperlipidemia CRI  dx 2012  Sees Dr Arty Baumgartner  DJD, had  chronic ankle pain Allergies -- dust mites   Plan: DM: Per Dr. Loanne Drilling, she does have neuropathy , feet care discussed HTN: Good compliance of medication, check a BMP. High cholesterol: On statins, no apparent side effects. Labs. Primary care-- data reviewed RTC 6 months

## 2014-12-27 NOTE — Progress Notes (Signed)
Pre visit review using our clinic review tool, if applicable. No additional management support is needed unless otherwise documented below in the visit note. 

## 2014-12-27 NOTE — Assessment & Plan Note (Addendum)
Data reviewed:  Td--9-09 pneumonia shot-- 9-09 prevnar-- 2015 Had a flu shot already got a shingles shot already   last mammogram 10-15 (-), negative breast exam 2014 PAPs, see previous entry, no further screening Cscope 02-2002 and 10-2012, bx tubular adenoma, next per  Dr Collene Mares  07-2009 DEXA-- normal,  DEXA 2015 normal

## 2014-12-28 DIAGNOSIS — Z09 Encounter for follow-up examination after completed treatment for conditions other than malignant neoplasm: Secondary | ICD-10-CM | POA: Insufficient documentation

## 2014-12-28 NOTE — Assessment & Plan Note (Signed)
DM: Per Dr. Loanne Drilling, she does have neuropathy (new), feet care discussed HTN: Good compliance of medication, check a BMP. High cholesterol: On statins, no apparent side effects. Labs. RTC 6 months

## 2014-12-30 ENCOUNTER — Encounter: Payer: Self-pay | Admitting: *Deleted

## 2015-01-01 DIAGNOSIS — J3089 Other allergic rhinitis: Secondary | ICD-10-CM | POA: Diagnosis not present

## 2015-01-03 DIAGNOSIS — J3089 Other allergic rhinitis: Secondary | ICD-10-CM | POA: Diagnosis not present

## 2015-01-07 DIAGNOSIS — J3089 Other allergic rhinitis: Secondary | ICD-10-CM | POA: Diagnosis not present

## 2015-01-08 ENCOUNTER — Ambulatory Visit
Admission: RE | Admit: 2015-01-08 | Discharge: 2015-01-08 | Disposition: A | Discharge status: Home / Self Care | Payer: Medicare Other | Source: Ambulatory Visit

## 2015-01-08 DIAGNOSIS — Z1231 Encounter for screening mammogram for malignant neoplasm of breast: Secondary | ICD-10-CM | POA: Diagnosis not present

## 2015-01-10 DIAGNOSIS — J3089 Other allergic rhinitis: Secondary | ICD-10-CM | POA: Diagnosis not present

## 2015-01-14 DIAGNOSIS — J3089 Other allergic rhinitis: Secondary | ICD-10-CM | POA: Diagnosis not present

## 2015-01-20 ENCOUNTER — Encounter: Payer: Self-pay | Admitting: Endocrinology

## 2015-01-20 ENCOUNTER — Ambulatory Visit (INDEPENDENT_AMBULATORY_CARE_PROVIDER_SITE_OTHER): Payer: Medicare Other | Admitting: Endocrinology

## 2015-01-20 VITALS — BP 134/87 | HR 89 | Temp 98.6°F | Ht 65.0 in | Wt 227.0 lb

## 2015-01-20 DIAGNOSIS — E1140 Type 2 diabetes mellitus with diabetic neuropathy, unspecified: Secondary | ICD-10-CM

## 2015-01-20 LAB — POCT GLYCOSYLATED HEMOGLOBIN (HGB A1C): Hemoglobin A1C: 7

## 2015-01-20 MED ORDER — INSULIN LISPRO PROT & LISPRO (75-25 MIX) 100 UNIT/ML KWIKPEN: PEN_INJECTOR | SUBCUTANEOUS | Status: DC

## 2015-01-20 NOTE — Patient Instructions (Addendum)
Please reduce the evening insulin to 10 units.  check your blood sugar twice a day.  vary the time of day when you check, between before the 3 meals, and at bedtime.  also check if you have symptoms of your blood sugar being too high or too low.  please keep a record of the readings and bring it to your next appointment here (or you can bring the meter itself).  You can write it on any piece of paper.  please call us sooner if your blood sugar goes below 70, or if you have a lot of readings over 200.   Please come back for a follow-up appointment in 3 months.

## 2015-01-20 NOTE — Progress Notes (Signed)
Subjective:    Patient ID: Angel Green, female    DOB: 1941-02-16, 74 y.o.   MRN: [id number]  HPI Pt returns for f/u of diabetes mellitus: DM type: Insulin-requiring type 2 Dx'ed: [id number] Complications: neuropathy of the lower extremities, and renal insufficiency.  Therapy: insulin since 2010.  GDM: never. DKA: never Severe hypoglycemia: never Pancreatitis: never Other: therapy has been limited by variable cbg's; she changed to BID premixed insulin, after poor results with multiple daily injections.   Interval history:  Pt says she never misses the insulin. . pt states she feels well in general.  she brings a record of her cbg's which i have reviewed today.  It varies from 64-194.  It is in general higher as the day goes on Past Medical History  Diagnosis Date  . Hypertension   . Diabetes mellitus, type 2 ([place])   . Hyperlipidemia   . Osteoarthritis   . Ankle pain     chronic  . Rhinitis     allergic nos  . RENAL INSUFFICIENCY, CHRONIC 04/16/2010  . Allergy     Dust Mites    Past Surgical History  Procedure Laterality Date  . Cholecystectomy    . Partial hysterectomy      in her 7s  . Dental implants    . Cataract extraction      Social History   Social History  . Marital Status: Married    Spouse Name: N/A  . Number of Children: 2  . Years of Education: N/A   Occupational History  .  retired    Social History Main Topics  . Smoking status: Never Smoker   . Smokeless tobacco: Never Used  . Alcohol Use: No  . Drug Use: No  . Sexual Activity:    Partners: Male   Other Topics Concern  . Not on file   Social History Narrative    husband is a Company secretary                Current Outpatient Prescriptions on File Prior to Visit  Medication Sig Dispense Refill  . amLODipine (NORVASC) 5 MG tablet Take 1 tablet (5 mg total) by mouth daily. 90 tablet 0  . aspirin 81 MG tablet Take 81 mg by mouth daily.      Marland Kitchen atorvastatin (LIPITOR) 10 MG tablet Take 1 tablet  (10 mg total) by mouth daily. 90 tablet 0  . cromolyn (NASALCROM) 5.2 MG/ACT nasal spray Place 1 spray into both nostrils 4 (four) times daily. 26 mL 12  . Cyanocobalamin (B-12 PO) Take 1 tablet by mouth daily.     Marland Kitchen EPINEPHrine 0.3 mg/0.3 mL IJ SOAJ injection Inject 0.3 mg into the muscle once.    . fluticasone (FLONASE) 50 MCG/ACT nasal spray USE 1 TO 2 SPRAYS IN EACH NOSTRIL DAILY 16 g 3  . fluticasone (FLOVENT HFA) 44 MCG/ACT inhaler Inhale 2 puffs into the lungs 2 (two) times daily. 3 Inhaler 3  . furosemide (LASIX) 20 MG tablet Take 2 tablets (40 mg total) by mouth daily. 180 tablet 0  . loratadine (CLARITIN) 10 MG tablet Take 10 mg by mouth daily.    Marland Kitchen losartan (COZAAR) 50 MG tablet Take 1 tablet (50 mg total) by mouth daily. 90 tablet 0  . Multiple Vitamins-Minerals (CENTRUM SILVER PO) Take 1 tablet by mouth daily.      Marland Kitchen omeprazole (PRILOSEC) 20 MG capsule Take 1 capsule (20 mg total) by mouth daily. 90 capsule 0  . Polyethyl Glycol-Propyl  Glycol (SYSTANE) 0.4-0.3 % SOLN Apply 1 drop to eye daily as needed (for dry eyes).    . SURE COMFORT PEN NEEDLES 31G X 5 MM MISC Use as directed.     No current facility-administered medications on file prior to visit.    No Known Allergies  Family History  Problem Relation Age of Onset  . Heart attack Neg Hx   . Colon cancer Neg Hx   . Breast cancer Neg Hx   . Stroke Neg Hx   . Healthy Brother   . Healthy Son   . Healthy Son     BP 134/87 mmHg  Pulse 89  Temp(Src) 98.6 F (37 C) (Oral)  Ht 5\' 5"  (1.651 m)  Wt 227 lb (102.967 kg)  BMI 37.77 kg/m2  SpO2 98%    Review of Systems Denies LOC    Objective:   Physical Exam VITAL SIGNS:  See vs page GENERAL: no distress Pulses: dorsalis pedis intact bilat.   MSK: no deformity of the feet CV: no leg edema Skin:  no ulcer on the feet.  normal color and temp on the feet. Neuro: sensation is intact to touch on the feet.     A1c=7.0%    Assessment & Plan:  DM: slightly  overcontrolled, given this regimen, which does match insulin to her changing needs throughout the day  Patient is advised the following: Patient Instructions  Please reduce the evening insulin to 10 units.  check your blood sugar twice a day.  vary the time of day when you check, between before the 3 meals, and at bedtime.  also check if you have symptoms of your blood sugar being too high or too low.  please keep a record of the readings and bring it to your next appointment here (or you can bring the meter itself).  You can write it on any piece of paper.  please call us sooner if your blood sugar goes below 70, or if you have a lot of readings over 200.   Please come back for a follow-up appointment in 3 months.

## 2015-01-21 DIAGNOSIS — J3089 Other allergic rhinitis: Secondary | ICD-10-CM | POA: Diagnosis not present

## 2015-01-28 ENCOUNTER — Other Ambulatory Visit: Payer: Self-pay | Admitting: Internal Medicine

## 2015-01-28 DIAGNOSIS — J3089 Other allergic rhinitis: Secondary | ICD-10-CM | POA: Diagnosis not present

## 2015-02-04 DIAGNOSIS — J3089 Other allergic rhinitis: Secondary | ICD-10-CM | POA: Diagnosis not present

## 2015-02-05 ENCOUNTER — Other Ambulatory Visit: Payer: Self-pay | Admitting: Internal Medicine

## 2015-02-11 DIAGNOSIS — J3089 Other allergic rhinitis: Secondary | ICD-10-CM | POA: Diagnosis not present

## 2015-02-19 DIAGNOSIS — J3089 Other allergic rhinitis: Secondary | ICD-10-CM | POA: Diagnosis not present

## 2015-02-25 DIAGNOSIS — J3089 Other allergic rhinitis: Secondary | ICD-10-CM | POA: Diagnosis not present

## 2015-03-03 ENCOUNTER — Other Ambulatory Visit: Payer: Self-pay | Admitting: Internal Medicine

## 2015-03-04 ENCOUNTER — Encounter: Payer: Self-pay | Admitting: Internal Medicine

## 2015-03-04 ENCOUNTER — Ambulatory Visit (INDEPENDENT_AMBULATORY_CARE_PROVIDER_SITE_OTHER): Payer: Medicare Other | Admitting: Internal Medicine

## 2015-03-04 VITALS — BP 124/66 | HR 88 | Temp 97.5°F | Ht 65.0 in | Wt 229.4 lb

## 2015-03-04 DIAGNOSIS — R059 Cough, unspecified: Secondary | ICD-10-CM

## 2015-03-04 DIAGNOSIS — R05 Cough: Secondary | ICD-10-CM | POA: Diagnosis not present

## 2015-03-04 DIAGNOSIS — J3089 Other allergic rhinitis: Secondary | ICD-10-CM | POA: Diagnosis not present

## 2015-03-04 MED ORDER — AZELASTINE HCL 0.1 % NA SOLN: 2.0000 | Freq: Every evening | NASAL | Status: DC | PRN

## 2015-03-04 MED ORDER — HYDROCODONE-HOMATROPINE 5-1.5 MG/5ML PO SYRP: 5.0000 mL | ORAL_SOLUTION | Freq: Two times a day (BID) | ORAL | Status: DC | PRN

## 2015-03-04 MED ORDER — AMOXICILLIN 500 MG PO CAPS: 1000.0000 mg | ORAL_CAPSULE | Freq: Two times a day (BID) | ORAL | Status: DC

## 2015-03-04 NOTE — Progress Notes (Signed)
Pre visit review using our clinic review tool, if applicable. No additional management support is needed unless otherwise documented below in the visit note. 

## 2015-03-04 NOTE — Patient Instructions (Signed)
Rest, fluids , tylenol  For cough:  Take Mucinex DM twice a day as needed until better  For nasal congestion: Continue the two nose sprays you already have, also use ASTELIN a prescribed spray : 2 nasal sprays on each side of the nose at night until you feel better   Avoid decongestants such as  Pseudoephedrine or phenylephrine     Take the antibiotic as prescribed  (Amoxicillin) if no better in 5 days   Call if not gradually better over the next  10 days  Call anytime if the symptoms are severe

## 2015-03-04 NOTE — Progress Notes (Signed)
Subjective:    Patient ID: Angel Green, female    DOB: 1940-03-07, 75 y.o.   MRN: [id number]  DOS:  03/04/2015 Type of visit - description : Acute  Visit Interval history:  Symptoms started approximately 10 days ago with nose and chest congestion, yellow to green nasal discharge and sputum production. Has seen a small amount of blood in the nasal discharge as well. Frequent and intense cough. Request a refill on hydrocodone which in the past was "the only thing that helped" Hoarseness today.   Review of Systems Denies any fever or chills. No nausea or vomiting. No wheezing.   Past Medical History  Diagnosis Date  . Hypertension   . Diabetes mellitus, type 2 ([place])   . Hyperlipidemia   . Osteoarthritis   . Ankle pain     chronic  . Rhinitis     allergic nos  . RENAL INSUFFICIENCY, CHRONIC 04/16/2010  . Allergy     Dust Mites    Past Surgical History  Procedure Laterality Date  . Cholecystectomy    . Partial hysterectomy      in her 51s  . Dental implants    . Cataract extraction      Social History   Social History  . Marital Status: Married    Spouse Name: N/A  . Number of Children: 2  . Years of Education: N/A   Occupational History  .  retired    Social History Main Topics  . Smoking status: Never Smoker   . Smokeless tobacco: Never Used  . Alcohol Use: No  . Drug Use: No  . Sexual Activity:    Partners: Male   Other Topics Concern  . Not on file   Social History Narrative    husband is a Company secretary                    Medication List       This list is accurate as of: 03/04/15 11:59 PM.  Always use your most recent med list.               amLODipine 5 MG tablet  Commonly known as:  NORVASC  Take 1 tablet (5 mg total) by mouth daily.     amoxicillin 500 MG capsule  Commonly known as:  AMOXIL  Take 2 capsules (1,000 mg total) by mouth 2 (two) times daily.     aspirin 81 MG tablet  Take 81 mg by mouth daily.     atorvastatin  10 MG tablet  Commonly known as:  LIPITOR  Take 1 tablet (10 mg total) by mouth daily.     azelastine 0.1 % nasal spray  Commonly known as:  ASTELIN  Place 2 sprays into both nostrils at bedtime as needed for rhinitis. Use in each nostril as directed     B-12 PO  Take 1 tablet by mouth daily.     CENTRUM SILVER PO  Take 1 tablet by mouth daily.     cromolyn 5.2 MG/ACT nasal spray  Commonly known as:  NASALCROM  Place 1 spray into both nostrils 4 (four) times daily.     EPINEPHrine 0.3 mg/0.3 mL Soaj injection  Commonly known as:  EPI-PEN  Inject 0.3 mg into the muscle once. Reported on 03/04/2015     fluticasone 44 MCG/ACT inhaler  Commonly known as:  FLOVENT HFA  Inhale 2 puffs into the lungs 2 (two) times daily.     fluticasone 50 MCG/ACT nasal  spray  Commonly known as:  FLONASE  USE 1 TO 2 SPRAYS IN EACH NOSTRIL DAILY     furosemide 20 MG tablet  Commonly known as:  LASIX  Take 2 tablets (40 mg total) by mouth daily.     HYDROcodone-homatropine 5-1.5 MG/5ML syrup  Commonly known as:  HYCODAN  Take 5 mLs by mouth 2 (two) times daily as needed for cough.     Insulin Lispro Prot & Lispro (75-25) 100 UNIT/ML Kwikpen  Commonly known as:  HUMALOG MIX 75/25 KWIKPEN  50 units with breakfast, and 10 units with the evening meal, and pen needles 2/day     loratadine 10 MG tablet  Commonly known as:  CLARITIN  Take 10 mg by mouth daily.     losartan 50 MG tablet  Commonly known as:  COZAAR  Take 1 tablet (50 mg total) by mouth daily.     omeprazole 20 MG capsule  Commonly known as:  PRILOSEC  Take 1 capsule (20 mg total) by mouth daily.     SURE COMFORT PEN NEEDLES 31G X 5 MM Misc  Generic drug:  Insulin Pen Needle  Reported on 03/04/2015     SYSTANE 0.4-0.3 % Soln  Generic drug:  Polyethyl Glycol-Propyl Glycol  Apply 1 drop to eye daily as needed (for dry eyes).           Objective:   Physical Exam BP 124/66 mmHg  Pulse 88  Temp(Src) 97.5 F (36.4 C)  (Oral)  Ht 5\' 5"  (1.651 m)  Wt 229 lb 6 oz (104.044 kg)  BMI 38.17 kg/m2  SpO2 96% General:   Well developed, well nourished . NAD.  HEENT:  Normocephalic . Face symmetric, atraumatic. Nose congested, TMs normal, sinuses no TTP. Throat symmetric Lungs:  CTA B Normal respiratory effort, no intercostal retractions, no accessory muscle use. Heart: RRR,  no murmur.  No pretibial edema bilaterally  Skin: Not pale. Not jaundice Neurologic:  alert & oriented X3.  Speech normal, gait appropriate for age and unassisted Psych--  Cognition and judgment appear intact.  Cooperative with normal attention span and concentration.  Behavior appropriate. No anxious or depressed appearing.      Assessment & Plan:   Assessment>  DM  Dr Loanne Drilling + Neuropathy: Per foot exam 12-2014 HTN ---change ACE to ARB is 03/2014 Hyperlipidemia CRI  dx 2012  Sees Dr Arty Baumgartner  DJD, had  chronic ankle pain Allergies -- dust mites , occ uses a inhaler , sees allergist, has shots q weeks started ~ 08-2014  Plan: URI with persisting cough: Recommend conservative treatment nasal sprays, Mucinex and hydrocodone for severe cough, warned about drowsiness. If not improving, start amoxicillin in few days. See instructions. RTC already scheduled for May 2017

## 2015-03-10 DIAGNOSIS — J3089 Other allergic rhinitis: Secondary | ICD-10-CM | POA: Diagnosis not present

## 2015-03-17 ENCOUNTER — Telehealth: Payer: Self-pay | Admitting: Internal Medicine

## 2015-03-17 NOTE — Telephone Encounter (Signed)
Last seen on 03/04/2015, if still having symptoms, would recommend to be seen again before further Abx.

## 2015-03-17 NOTE — Telephone Encounter (Signed)
Call patient and scheduled her tomorrow 1/24 with Dr. Larose Kells @ 9:30. Same Day slot per EM

## 2015-03-17 NOTE — Telephone Encounter (Signed)
Caller name: Self  Can be reached: [phone] OR [phone]  Reason for call: Patient states that she is still very congested even after taking the medications that were given to her by Dr. Larose Kells. Scheduled appt for Friday but she wants to be seen sooner or have Antibiotic and Hydrocodone refilled. Plse adv if she needs to be scheduled sooner in a Same Day slot.

## 2015-03-18 ENCOUNTER — Encounter: Payer: Self-pay | Admitting: Internal Medicine

## 2015-03-18 ENCOUNTER — Ambulatory Visit (INDEPENDENT_AMBULATORY_CARE_PROVIDER_SITE_OTHER): Payer: Medicare Other | Admitting: Internal Medicine

## 2015-03-18 ENCOUNTER — Telehealth: Payer: Self-pay | Admitting: Internal Medicine

## 2015-03-18 VITALS — BP 128/80 | HR 81 | Temp 97.9°F | Ht 65.0 in | Wt 227.5 lb

## 2015-03-18 DIAGNOSIS — R059 Cough, unspecified: Secondary | ICD-10-CM

## 2015-03-18 DIAGNOSIS — R05 Cough: Secondary | ICD-10-CM | POA: Diagnosis not present

## 2015-03-18 DIAGNOSIS — J3089 Other allergic rhinitis: Secondary | ICD-10-CM | POA: Diagnosis not present

## 2015-03-18 MED ORDER — PREDNISONE 10 MG PO TABS: 10.0000 mg | ORAL_TABLET | Freq: Every day | ORAL | Status: DC

## 2015-03-18 MED ORDER — HYDROCODONE-HOMATROPINE 5-1.5 MG/5ML PO SYRP: 5.0000 mL | ORAL_SOLUTION | Freq: Two times a day (BID) | ORAL | Status: DC | PRN

## 2015-03-18 NOTE — Patient Instructions (Signed)
Continue with Mucinex DM twice a day as needed until cough is better  Use hydrocodone as needed. Watch for drowsiness   For nasal congestion: Continue Flonase and Nasalcrom Increase Astelin 2 sprays twice a day  Prednisone for 5 days. Check your blood sugars once or twice a day, stop prednisone if blood sugars more than 180.   Call if not gradually better over the next  10 days  Call anytime if the symptoms are severe

## 2015-03-18 NOTE — Progress Notes (Signed)
Subjective:    Patient ID: Angel Green, female    DOB: 1940/12/07, 75 y.o.   MRN: [id number]  DOS:  03/18/2015 Type of visit - description : Acute visit Interval history: Was seen about 2 weeks ago with cough, she is using the nasal sprays, hydrocodone which help, was not getting better, took amoxicillin. She is here because she is still having a lot of cough, slightly better but symptoms not gone. + Sinus and chest congestion, +postnasal dripping, and pooling  mucus in the throat.    Review of Systems No fever or chills No sneezing, itchy eyes or nose. No acid reflux No wheezing  Past Medical History  Diagnosis Date  . Hypertension   . Diabetes mellitus, type 2 ([place])   . Hyperlipidemia   . Osteoarthritis   . Ankle pain     chronic  . Rhinitis     allergic nos  . RENAL INSUFFICIENCY, CHRONIC 04/16/2010  . Allergy     Dust Mites    Past Surgical History  Procedure Laterality Date  . Cholecystectomy    . Partial hysterectomy      in her 47s  . Dental implants    . Cataract extraction      Social History   Social History  . Marital Status: Married    Spouse Name: N/A  . Number of Children: 2  . Years of Education: N/A   Occupational History  .  retired    Social History Main Topics  . Smoking status: Never Smoker   . Smokeless tobacco: Never Used  . Alcohol Use: No  . Drug Use: No  . Sexual Activity:    Partners: Male   Other Topics Concern  . Not on file   Social History Narrative    husband is a Company secretary                    Medication List       This list is accurate as of: 03/18/15  5:23 PM.  Always use your most recent med list.               amLODipine 5 MG tablet  Commonly known as:  NORVASC  Take 1 tablet (5 mg total) by mouth daily.     aspirin 81 MG tablet  Take 81 mg by mouth daily.     atorvastatin 10 MG tablet  Commonly known as:  LIPITOR  Take 1 tablet (10 mg total) by mouth daily.     azelastine 0.1 % nasal  spray  Commonly known as:  ASTELIN  Place 2 sprays into both nostrils 2 (two) times daily. Use in each nostril as directed     azelastine 0.1 % nasal spray  Commonly known as:  ASTELIN  Place 2 sprays into both nostrils at bedtime as needed for rhinitis. Use in each nostril as directed     B-12 PO  Take 1 tablet by mouth daily.     CENTRUM SILVER PO  Take 1 tablet by mouth daily.     cromolyn 5.2 MG/ACT nasal spray  Commonly known as:  NASALCROM  Place 1 spray into both nostrils 4 (four) times daily.     EPINEPHrine 0.3 mg/0.3 mL Soaj injection  Commonly known as:  EPI-PEN  Inject 0.3 mg into the muscle once. Reported on 03/18/2015     fluticasone 44 MCG/ACT inhaler  Commonly known as:  FLOVENT HFA  Inhale 2 puffs into the lungs 2 (two)  times daily.     fluticasone 50 MCG/ACT nasal spray  Commonly known as:  FLONASE  USE 1 TO 2 SPRAYS IN EACH NOSTRIL DAILY     furosemide 20 MG tablet  Commonly known as:  LASIX  Take 2 tablets (40 mg total) by mouth daily.     HYDROcodone-homatropine 5-1.5 MG/5ML syrup  Commonly known as:  HYCODAN  Take 5 mLs by mouth 2 (two) times daily as needed for cough.     Insulin Lispro Prot & Lispro (75-25) 100 UNIT/ML Kwikpen  Commonly known as:  HUMALOG MIX 75/25 KWIKPEN  50 units with breakfast, and 10 units with the evening meal, and pen needles 2/day     loratadine 10 MG tablet  Commonly known as:  CLARITIN  Take 10 mg by mouth daily.     losartan 50 MG tablet  Commonly known as:  COZAAR  Take 1 tablet (50 mg total) by mouth daily.     omeprazole 20 MG capsule  Commonly known as:  PRILOSEC  Take 1 capsule (20 mg total) by mouth daily.     predniSONE 10 MG tablet  Commonly known as:  DELTASONE  Take 1 tablet (10 mg total) by mouth daily. 2 tabs a day x 5 days     SURE COMFORT PEN NEEDLES 31G X 5 MM Misc  Generic drug:  Insulin Pen Needle  Reported on 03/18/2015     SYSTANE 0.4-0.3 % Soln  Generic drug:  Polyethyl Glycol-Propyl  Glycol  Apply 1 drop to eye daily as needed (for dry eyes).           Objective:   Physical Exam BP 128/80 mmHg  Pulse 81  Temp(Src) 97.9 F (36.6 C) (Oral)  Ht 5\' 5"  (1.651 m)  Wt 227 lb 8 oz (103.193 kg)  BMI 37.86 kg/m2  SpO2 96% General:   Well developed, well nourished . NAD.  HEENT:  Normocephalic . Face symmetric, atraumatic. TMs: Normal, nose is slightly congested, sinuses no TTP. Throat symmetric. Lungs:  CTA B Normal respiratory effort, no intercostal retractions, no accessory muscle use. Heart: RRR,  no murmur.  No pretibial edema bilaterally  Skin: Not pale. Not jaundice Neurologic:  alert & oriented X3.  Speech normal, gait appropriate for age and unassisted Psych--  Cognition and judgment appear intact.  Cooperative with normal attention span and concentration.  Behavior appropriate. No anxious or depressed appearing.      Assessment & Plan:   Assessment>  DM  Dr Loanne Drilling + Neuropathy: Per foot exam 12-2014 HTN ---change ACE to ARB is 03/2014 Hyperlipidemia CRI  dx 2012  Sees Dr Arty Baumgartner  DJD, had  chronic ankle pain Allergies -- dust mites , occ uses a inhaler , sees allergist, has shots q weeks started ~ 08-2014  Plan: URI, persistent cough: Status post amoxicillin, cough better but not completely gone. She has a lot of sinus congestion and postnasal dripping. Plan: Continue Flonase, cromolyn nasal spray, increase Astelin to  twice a day, refill hydrocodone. Low dose prednisone 20 mg 5 days to decrease mucus burden. Watch CBGs, currently around 100,d/c prednisone if more than 180. If not better will call, Z-Pak? RTC May 2017

## 2015-03-18 NOTE — Progress Notes (Signed)
Pre visit review using our clinic review tool, if applicable. No additional management support is needed unless otherwise documented below in the visit note. 

## 2015-03-18 NOTE — Telephone Encounter (Signed)
[id number] - [place] ([place]), [place] - [address] [phone] (Phone) [phone] (Fax)        Reason for call:  Vladimir Faster in need of clarification regarding predniSONE (DELTASONE) 10 MG tablet and patient advised pharmacy there was suppose to be another Rx sent over. Please advise pharmacy directly

## 2015-03-18 NOTE — Telephone Encounter (Signed)
Spoke with [facility] instructed Prednisone is supposed to be 2 tablets daily for 5 days. Also informed that we gave Pt Rx for Hycodan syrup which I believe Pt maybe referring to as the second prescription. Pharmacy verbalized understanding.

## 2015-03-21 ENCOUNTER — Ambulatory Visit: Payer: Medicare Other | Admitting: Internal Medicine

## 2015-03-25 DIAGNOSIS — J3089 Other allergic rhinitis: Secondary | ICD-10-CM | POA: Diagnosis not present

## 2015-04-01 DIAGNOSIS — J3089 Other allergic rhinitis: Secondary | ICD-10-CM | POA: Diagnosis not present

## 2015-04-03 ENCOUNTER — Ambulatory Visit (INDEPENDENT_AMBULATORY_CARE_PROVIDER_SITE_OTHER): Payer: Medicare Other | Admitting: Family Medicine

## 2015-04-03 ENCOUNTER — Encounter: Payer: Self-pay | Admitting: Family Medicine

## 2015-04-03 VITALS — BP 120/64 | HR 63 | Temp 97.7°F | Ht 65.0 in | Wt 228.2 lb

## 2015-04-03 DIAGNOSIS — M79676 Pain in unspecified toe(s): Secondary | ICD-10-CM | POA: Diagnosis not present

## 2015-04-03 NOTE — Progress Notes (Signed)
HPI:  Angel Green is a very pleasant 75 yo here for an acute visit for toe pain. Reports last night had burning sensation in the skin only between her toes on the L foot. This is resolved today. She was worried about an infection. No pain, fevers, malaise, skin trauma, redness, swelling.  ROS: See pertinent positives and negatives per HPI.  Past Medical History  Diagnosis Date  . Hypertension   . Diabetes mellitus, type 2 ([place])   . Hyperlipidemia   . Osteoarthritis   . Ankle pain     chronic  . Rhinitis     allergic nos  . RENAL INSUFFICIENCY, CHRONIC 04/16/2010  . Allergy     Dust Mites    Past Surgical History  Procedure Laterality Date  . Cholecystectomy    . Partial hysterectomy      in her 42s  . Dental implants    . Cataract extraction      Family History  Problem Relation Age of Onset  . Heart attack Neg Hx   . Colon cancer Neg Hx   . Breast cancer Neg Hx   . Stroke Neg Hx   . Healthy Brother   . Healthy Son   . Healthy Son     Social History   Social History  . Marital Status: Married    Spouse Name: N/A  . Number of Children: 2  . Years of Education: N/A   Occupational History  .  retired    Social History Main Topics  . Smoking status: Never Smoker   . Smokeless tobacco: Never Used  . Alcohol Use: No  . Drug Use: No  . Sexual Activity:    Partners: Male   Other Topics Concern  . None   Social History Narrative    husband is a Company secretary                 Current outpatient prescriptions:  .  amLODipine (NORVASC) 5 MG tablet, Take 1 tablet (5 mg total) by mouth daily., Disp: 90 tablet, Rfl: 2 .  aspirin 81 MG tablet, Take 81 mg by mouth daily.  , Disp: , Rfl:  .  atorvastatin (LIPITOR) 10 MG tablet, Take 1 tablet (10 mg total) by mouth daily., Disp: 90 tablet, Rfl: 2 .  azelastine (ASTELIN) 0.1 % nasal spray, Place 2 sprays into both nostrils at bedtime as needed for rhinitis. Use in each nostril as directed, Disp: 30 mL, Rfl: 3 .   azelastine (ASTELIN) 0.1 % nasal spray, Place 2 sprays into both nostrils 2 (two) times daily. Use in each nostril as directed, Disp: , Rfl:  .  cromolyn (NASALCROM) 5.2 MG/ACT nasal spray, Place 1 spray into both nostrils 4 (four) times daily., Disp: 26 mL, Rfl: 12 .  Cyanocobalamin (B-12 PO), Take 1 tablet by mouth daily. , Disp: , Rfl:  .  EPINEPHrine 0.3 mg/0.3 mL IJ SOAJ injection, Inject 0.3 mg into the muscle once. Reported on 03/18/2015, Disp: , Rfl:  .  fluticasone (FLONASE) 50 MCG/ACT nasal spray, USE 1 TO 2 SPRAYS IN EACH NOSTRIL DAILY, Disp: 16 g, Rfl: 3 .  fluticasone (FLOVENT HFA) 44 MCG/ACT inhaler, Inhale 2 puffs into the lungs 2 (two) times daily., Disp: 3 Inhaler, Rfl: 3 .  furosemide (LASIX) 20 MG tablet, Take 2 tablets (40 mg total) by mouth daily., Disp: 180 tablet, Rfl: 2 .  HYDROcodone-homatropine (HYCODAN) 5-1.5 MG/5ML syrup, Take 5 mLs by mouth 2 (two) times daily as needed for  cough., Disp: 240 mL, Rfl: 0 .  Insulin Lispro Prot & Lispro (HUMALOG MIX 75/25 KWIKPEN) (75-25) 100 UNIT/ML Kwikpen, 50 units with breakfast, and 10 units with the evening meal, and pen needles 2/day, Disp: 75 mL, Rfl: 11 .  loratadine (CLARITIN) 10 MG tablet, Take 10 mg by mouth daily., Disp: , Rfl:  .  losartan (COZAAR) 50 MG tablet, Take 1 tablet (50 mg total) by mouth daily., Disp: 90 tablet, Rfl: 2 .  Multiple Vitamins-Minerals (CENTRUM SILVER PO), Take 1 tablet by mouth daily.  , Disp: , Rfl:  .  omeprazole (PRILOSEC) 20 MG capsule, Take 1 capsule (20 mg total) by mouth daily., Disp: 90 capsule, Rfl: 2 .  Polyethyl Glycol-Propyl Glycol (SYSTANE) 0.4-0.3 % SOLN, Apply 1 drop to eye daily as needed (for dry eyes)., Disp: , Rfl:  .  predniSONE (DELTASONE) 10 MG tablet, Take 1 tablet (10 mg total) by mouth daily. 2 tabs a day x 5 days, Disp: 10 tablet, Rfl: 0 .  SURE COMFORT PEN NEEDLES 31G X 5 MM MISC, Reported on 03/18/2015, Disp: , Rfl:   EXAM:  Filed Vitals:   04/03/15 1539  BP: 120/64   Pulse: 63  Temp: 97.7 F (36.5 C)    Body mass index is 37.97 kg/(m^2).  GENERAL: vitals reviewed and listed above, alert, oriented, appears well hydrated and in no acute distress  MS/SKIN: Normal exam of feet without any redness, swelling or skin cracks, mildly scaly skin around toe creases. Normal cap refill.   MS: moves all extremities without noticeable abnormality  PSYCH: pleasant and cooperative, no obvious depression or anxiety  ASSESSMENT AND PLAN:  Discussed the following assessment and plan:  Pain of toe, unspecified laterality  -symptoms resolved, unsure of etiology - query mild fungal infection or neuropathy. Advised topical treatments, good glycemic control, routine follow up w/ PCP or as needed if recurrent or new symptoms. -Patient advised to return or notify a doctor immediately if symptoms worsen or persist or new concerns arise.  There are no Patient Instructions on file for this visit.   Colin Benton R.

## 2015-04-03 NOTE — Progress Notes (Signed)
Pre visit review using our clinic review tool, if applicable. No additional management support is needed unless otherwise documented below in the visit note. 

## 2015-04-09 DIAGNOSIS — J3089 Other allergic rhinitis: Secondary | ICD-10-CM | POA: Diagnosis not present

## 2015-04-15 DIAGNOSIS — J3089 Other allergic rhinitis: Secondary | ICD-10-CM | POA: Diagnosis not present

## 2015-04-21 NOTE — Telephone Encounter (Signed)
No Charge 

## 2015-04-21 NOTE — Telephone Encounter (Signed)
[place] or [place] - Please waive the no show fee from 12/19/14. I have notified the pt it will be waived.

## 2015-04-21 NOTE — Telephone Encounter (Signed)
Pt called and states she was here early for appt with Dr. Larose Kells and was told to come back. She said that she came back and they told her Dr. Larose Kells couldn't see her so she waited to see the nurse. She states she waited almost an hour. Pt requesting no show fee is waived.

## 2015-04-22 ENCOUNTER — Encounter: Payer: Self-pay | Admitting: Endocrinology

## 2015-04-22 ENCOUNTER — Ambulatory Visit (INDEPENDENT_AMBULATORY_CARE_PROVIDER_SITE_OTHER): Payer: Medicare Other | Admitting: Endocrinology

## 2015-04-22 VITALS — BP 132/86 | HR 92 | Temp 97.7°F | Ht 65.0 in | Wt 227.0 lb

## 2015-04-22 DIAGNOSIS — N183 Chronic kidney disease, stage 3 (moderate): Secondary | ICD-10-CM | POA: Diagnosis not present

## 2015-04-22 DIAGNOSIS — E1122 Type 2 diabetes mellitus with diabetic chronic kidney disease: Secondary | ICD-10-CM

## 2015-04-22 DIAGNOSIS — Z794 Long term (current) use of insulin: Secondary | ICD-10-CM

## 2015-04-22 DIAGNOSIS — E1129 Type 2 diabetes mellitus with other diabetic kidney complication: Secondary | ICD-10-CM | POA: Diagnosis not present

## 2015-04-22 DIAGNOSIS — J3089 Other allergic rhinitis: Secondary | ICD-10-CM | POA: Diagnosis not present

## 2015-04-22 NOTE — Progress Notes (Signed)
Subjective:    Patient ID: Angel Green, female    DOB: 1940/08/31, 75 y.o.   MRN: [id number]  HPI Pt returns for f/u of diabetes mellitus: DM type: Insulin-requiring type 2 Dx'ed: [id number] Complications: neuropathy of the lower extremities, and renal insufficiency.  Therapy: insulin since 2010.  GDM: never. DKA: never Severe hypoglycemia: never Pancreatitis: never Other: therapy has been limited by variable cbg's; she changed to BID premixed insulin, after poor results with multiple daily injections.   Interval history:  no cbg record, but states cbg's are well-controlled, except for a course of steroids, which she finished approx 1 month ago.  It is highest at hs.   Past Medical History  Diagnosis Date  . Hypertension   . Diabetes mellitus, type 2 ([place])   . Hyperlipidemia   . Osteoarthritis   . Ankle pain     chronic  . Rhinitis     allergic nos  . RENAL INSUFFICIENCY, CHRONIC 04/16/2010  . Allergy     Dust Mites    Past Surgical History  Procedure Laterality Date  . Cholecystectomy    . Partial hysterectomy      in her 68s  . Dental implants    . Cataract extraction      Social History   Social History  . Marital Status: Married    Spouse Name: N/A  . Number of Children: 2  . Years of Education: N/A   Occupational History  .  retired    Social History Main Topics  . Smoking status: Never Smoker   . Smokeless tobacco: Never Used  . Alcohol Use: No  . Drug Use: No  . Sexual Activity:    Partners: Male   Other Topics Concern  . Not on file   Social History Narrative    husband is a Company secretary                Current Outpatient Prescriptions on File Prior to Visit  Medication Sig Dispense Refill  . amLODipine (NORVASC) 5 MG tablet Take 1 tablet (5 mg total) by mouth daily. 90 tablet 2  . aspirin 81 MG tablet Take 81 mg by mouth daily.      Marland Kitchen atorvastatin (LIPITOR) 10 MG tablet Take 1 tablet (10 mg total) by mouth daily. 90 tablet 2  . azelastine  (ASTELIN) 0.1 % nasal spray Place 2 sprays into both nostrils at bedtime as needed for rhinitis. Use in each nostril as directed 30 mL 3  . azelastine (ASTELIN) 0.1 % nasal spray Place 2 sprays into both nostrils 2 (two) times daily. Use in each nostril as directed    . cromolyn (NASALCROM) 5.2 MG/ACT nasal spray Place 1 spray into both nostrils 4 (four) times daily. 26 mL 12  . Cyanocobalamin (B-12 PO) Take 1 tablet by mouth daily.     Marland Kitchen EPINEPHrine 0.3 mg/0.3 mL IJ SOAJ injection Inject 0.3 mg into the muscle once. Reported on 03/18/2015    . fluticasone (FLONASE) 50 MCG/ACT nasal spray USE 1 TO 2 SPRAYS IN EACH NOSTRIL DAILY 16 g 3  . fluticasone (FLOVENT HFA) 44 MCG/ACT inhaler Inhale 2 puffs into the lungs 2 (two) times daily. 3 Inhaler 3  . furosemide (LASIX) 20 MG tablet Take 2 tablets (40 mg total) by mouth daily. 180 tablet 2  . Insulin Lispro Prot & Lispro (HUMALOG MIX 75/25 KWIKPEN) (75-25) 100 UNIT/ML Kwikpen 50 units with breakfast, and 10 units with the evening meal, and pen needles 2/day 75  mL 11  . loratadine (CLARITIN) 10 MG tablet Take 10 mg by mouth daily.    Marland Kitchen losartan (COZAAR) 50 MG tablet Take 1 tablet (50 mg total) by mouth daily. 90 tablet 2  . Multiple Vitamins-Minerals (CENTRUM SILVER PO) Take 1 tablet by mouth daily.      Marland Kitchen omeprazole (PRILOSEC) 20 MG capsule Take 1 capsule (20 mg total) by mouth daily. 90 capsule 2  . Polyethyl Glycol-Propyl Glycol (SYSTANE) 0.4-0.3 % SOLN Apply 1 drop to eye daily as needed (for dry eyes).    Haig Prophet COMFORT PEN NEEDLES 31G X 5 MM MISC Reported on 03/18/2015     No current facility-administered medications on file prior to visit.    No Known Allergies  Family History  Problem Relation Age of Onset  . Heart attack Neg Hx   . Colon cancer Neg Hx   . Breast cancer Neg Hx   . Stroke Neg Hx   . Healthy Brother   . Healthy Son   . Healthy Son     BP 132/86 mmHg  Pulse 92  Temp(Src) 97.7 F (36.5 C) (Oral)  Ht 5\' 5"  (1.651 m)   Wt 227 lb (102.967 kg)  BMI 37.77 kg/m2  SpO2 94%   Review of Systems She denies hypoglycemia.     Objective:   Physical Exam VITAL SIGNS:  See vs page GENERAL: no distress Pulses: dorsalis pedis intact bilat.   MSK: no deformity of the feet CV: no leg edema Skin:  no ulcer on the feet.  normal color and temp on the feet. Neuro: sensation is intact to touch on the feet  Lab Results  Component Value Date   CREATININE 1.39* 12/27/2014   BUN 16 12/27/2014   NA 144 12/27/2014   K 4.0 12/27/2014   CL 105 12/27/2014   CO2 33* 12/27/2014      Assessment & Plan:  DM: uncertain glycemic control Acute bronchitis, better.  She has been off steroids x a few weeks, so we can check fructosamine.  Renal insuff: in this setting, (along with the BID insulin), our goal a1c is in the 7's.    Patient is advised the following: Patient Instructions  blood tests are requested for you today.  We'll let you know about the results.  check your blood sugar twice a day.  vary the time of day when you check, between before the 3 meals, and at bedtime.  also check if you have symptoms of your blood sugar being too high or too low.  please keep a record of the readings and bring it to your next appointment here (or you can bring the meter itself).  You can write it on any piece of paper.  please call us sooner if your blood sugar goes below 70, or if you have a lot of readings over 200.   Please come back for a follow-up appointment in 3 months.

## 2015-04-22 NOTE — Patient Instructions (Addendum)
blood tests are requested for you today.  We'll let you know about the results.  check your blood sugar twice a day.  vary the time of day when you check, between before the 3 meals, and at bedtime.  also check if you have symptoms of your blood sugar being too high or too low.  please keep a record of the readings and bring it to your next appointment here (or you can bring the meter itself).  You can write it on any piece of paper.  please call us sooner if your blood sugar goes below 70, or if you have a lot of readings over 200.   Please come back for a follow-up appointment in 3 months.

## 2015-04-23 ENCOUNTER — Encounter: Payer: Self-pay | Admitting: Endocrinology

## 2015-04-23 DIAGNOSIS — E119 Type 2 diabetes mellitus without complications: Secondary | ICD-10-CM | POA: Insufficient documentation

## 2015-04-24 LAB — FRUCTOSAMINE: FRUCTOSAMINE: 370 umol/L — AB (ref 190–270)

## 2015-04-28 DIAGNOSIS — J3089 Other allergic rhinitis: Secondary | ICD-10-CM | POA: Diagnosis not present

## 2015-04-29 DIAGNOSIS — J3089 Other allergic rhinitis: Secondary | ICD-10-CM | POA: Diagnosis not present

## 2015-05-07 DIAGNOSIS — N189 Chronic kidney disease, unspecified: Secondary | ICD-10-CM | POA: Diagnosis not present

## 2015-05-07 DIAGNOSIS — E785 Hyperlipidemia, unspecified: Secondary | ICD-10-CM | POA: Diagnosis not present

## 2015-05-07 DIAGNOSIS — Z6841 Body Mass Index (BMI) 40.0 and over, adult: Secondary | ICD-10-CM | POA: Diagnosis not present

## 2015-05-07 DIAGNOSIS — I129 Hypertensive chronic kidney disease with stage 1 through stage 4 chronic kidney disease, or unspecified chronic kidney disease: Secondary | ICD-10-CM | POA: Diagnosis not present

## 2015-05-07 DIAGNOSIS — J3089 Other allergic rhinitis: Secondary | ICD-10-CM | POA: Diagnosis not present

## 2015-05-07 DIAGNOSIS — N2581 Secondary hyperparathyroidism of renal origin: Secondary | ICD-10-CM | POA: Diagnosis not present

## 2015-05-07 DIAGNOSIS — D631 Anemia in chronic kidney disease: Secondary | ICD-10-CM | POA: Diagnosis not present

## 2015-05-07 DIAGNOSIS — N183 Chronic kidney disease, stage 3 (moderate): Secondary | ICD-10-CM | POA: Diagnosis not present

## 2015-05-07 LAB — CBC AND DIFFERENTIAL
HEMATOCRIT: 42 % (ref 36–46)
HEMOGLOBIN: 14.3 g/dL (ref 12.0–16.0)
NEUTROS ABS: 3 /uL
PLATELETS: 241 10*3/uL (ref 150–399)
WBC: 5.5 10*3/mL

## 2015-05-07 LAB — HEPATIC FUNCTION PANEL
ALK PHOS: 98 U/L (ref 25–125)
ALT: 15 U/L (ref 7–35)
AST: 19 U/L (ref 13–35)
BILIRUBIN, TOTAL: 0.3 mg/dL

## 2015-05-07 LAB — BASIC METABOLIC PANEL
BUN: 12 mg/dL (ref 4–21)
Creatinine: 1.5 mg/dL — AB (ref 0.5–1.1)
Glucose: 204 mg/dL
Potassium: 3.6 mmol/L (ref 3.4–5.3)
Sodium: 140 mmol/L (ref 137–147)

## 2015-05-12 ENCOUNTER — Encounter: Payer: Self-pay | Admitting: Internal Medicine

## 2015-05-14 DIAGNOSIS — J3089 Other allergic rhinitis: Secondary | ICD-10-CM | POA: Diagnosis not present

## 2015-05-20 DIAGNOSIS — J3089 Other allergic rhinitis: Secondary | ICD-10-CM | POA: Diagnosis not present

## 2015-05-27 DIAGNOSIS — J3089 Other allergic rhinitis: Secondary | ICD-10-CM | POA: Diagnosis not present

## 2015-06-03 DIAGNOSIS — J3089 Other allergic rhinitis: Secondary | ICD-10-CM | POA: Diagnosis not present

## 2015-06-10 DIAGNOSIS — J3089 Other allergic rhinitis: Secondary | ICD-10-CM | POA: Diagnosis not present

## 2015-06-16 ENCOUNTER — Encounter (HOSPITAL_COMMUNITY): Payer: Self-pay | Admitting: Emergency Medicine

## 2015-06-16 ENCOUNTER — Emergency Department (HOSPITAL_COMMUNITY)
Admission: EM | Admit: 2015-06-16 | Discharge: 2015-06-16 | Disposition: A | Discharge status: Home / Self Care | Payer: Medicare Other | Attending: Emergency Medicine | Admitting: Emergency Medicine

## 2015-06-16 DIAGNOSIS — Z794 Long term (current) use of insulin: Secondary | ICD-10-CM | POA: Insufficient documentation

## 2015-06-16 DIAGNOSIS — E119 Type 2 diabetes mellitus without complications: Secondary | ICD-10-CM | POA: Insufficient documentation

## 2015-06-16 DIAGNOSIS — N189 Chronic kidney disease, unspecified: Secondary | ICD-10-CM | POA: Insufficient documentation

## 2015-06-16 DIAGNOSIS — Z9049 Acquired absence of other specified parts of digestive tract: Secondary | ICD-10-CM | POA: Insufficient documentation

## 2015-06-16 DIAGNOSIS — N390 Urinary tract infection, site not specified: Secondary | ICD-10-CM | POA: Diagnosis not present

## 2015-06-16 DIAGNOSIS — Z7982 Long term (current) use of aspirin: Secondary | ICD-10-CM | POA: Insufficient documentation

## 2015-06-16 DIAGNOSIS — E785 Hyperlipidemia, unspecified: Secondary | ICD-10-CM | POA: Insufficient documentation

## 2015-06-16 DIAGNOSIS — I129 Hypertensive chronic kidney disease with stage 1 through stage 4 chronic kidney disease, or unspecified chronic kidney disease: Secondary | ICD-10-CM | POA: Insufficient documentation

## 2015-06-16 DIAGNOSIS — Z79899 Other long term (current) drug therapy: Secondary | ICD-10-CM | POA: Insufficient documentation

## 2015-06-16 DIAGNOSIS — M1990 Unspecified osteoarthritis, unspecified site: Secondary | ICD-10-CM | POA: Diagnosis not present

## 2015-06-16 DIAGNOSIS — R300 Dysuria: Secondary | ICD-10-CM | POA: Diagnosis present

## 2015-06-16 DIAGNOSIS — G8929 Other chronic pain: Secondary | ICD-10-CM | POA: Insufficient documentation

## 2015-06-16 LAB — URINALYSIS, ROUTINE W REFLEX MICROSCOPIC
BILIRUBIN URINE: NEGATIVE
GLUCOSE, UA: NEGATIVE mg/dL
KETONES UR: NEGATIVE mg/dL
Nitrite: NEGATIVE
PROTEIN: NEGATIVE mg/dL
Specific Gravity, Urine: 1.007 (ref 1.005–1.030)
pH: 7 (ref 5.0–8.0)

## 2015-06-16 LAB — URINE MICROSCOPIC-ADD ON

## 2015-06-16 MED ORDER — PHENAZOPYRIDINE HCL 95 MG PO TABS: 95.0000 mg | ORAL_TABLET | Freq: Three times a day (TID) | ORAL | Status: DC | PRN

## 2015-06-16 MED ORDER — CEPHALEXIN 500 MG PO CAPS: 500.0000 mg | ORAL_CAPSULE | Freq: Two times a day (BID) | ORAL | Status: DC

## 2015-06-16 NOTE — Discharge Instructions (Signed)

## 2015-06-16 NOTE — ED Provider Notes (Signed)
CSN: [id number]     Arrival date & time 06/16/15  1403 History  By signing my name below, I, [facility], attest that this documentation has been prepared under the direction and in the presence of Gloriann Loan, PA-C. Electronically Signed: Virgel Bouquet, ED Scribe. 06/16/2015. 9:01 PM.     Chief Complaint  Patient presents with  . Dysuria   The history is provided by the patient. No language interpreter was used.  HPI Comments: Angel Green is a 75 y.o. female with an hx of HTN, DM, HLN, and chronic renal insufficiency who presents to the Emergency Department complaining of intermittent, mild dysuria and hematuria onset 4 days ago. Patient states that she saw a few drops of bright red blood in her blood when she went to wipe herself. Pain only presents with urination. Denies vaginal bleeding, vaginal discharge, vaginal pain, rectal pain, melena or hematochezia, diarrhea, constipation, back pain, flank pain, abdominal pain, fever, nausea, vomiting.  Past Medical History  Diagnosis Date  . Hypertension   . Diabetes mellitus, type 2 ([place])   . Hyperlipidemia   . Osteoarthritis   . Ankle pain     chronic  . Rhinitis     allergic nos  . RENAL INSUFFICIENCY, CHRONIC 04/16/2010  . Allergy     Dust Mites   Past Surgical History  Procedure Laterality Date  . Cholecystectomy    . Partial hysterectomy      in her 26s  . Dental implants    . Cataract extraction     Family History  Problem Relation Age of Onset  . Heart attack Neg Hx   . Colon cancer Neg Hx   . Breast cancer Neg Hx   . Stroke Neg Hx   . Healthy Brother   . Healthy Son   . Healthy Son    Social History  Substance Use Topics  . Smoking status: Never Smoker   . Smokeless tobacco: Never Used  . Alcohol Use: No   OB History    No data available     Review of Systems  Constitutional: Negative for fever.  Gastrointestinal: Negative for nausea, vomiting, abdominal pain, diarrhea, constipation, blood in  stool, anal bleeding and rectal pain.  Genitourinary: Positive for dysuria and hematuria. Negative for flank pain, vaginal bleeding, vaginal discharge and vaginal pain.  Musculoskeletal: Negative for back pain.  All other systems reviewed and are negative.  Allergies  Review of patient's allergies indicates no known allergies.  Home Medications   Prior to Admission medications   Medication Sig Start Date End Date Taking? Authorizing Provider  amLODipine (NORVASC) 5 MG tablet Take 1 tablet (5 mg total) by mouth daily. 03/03/15   Colon Branch, MD  aspirin 81 MG tablet Take 81 mg by mouth daily.      Historical Provider, MD  atorvastatin (LIPITOR) 10 MG tablet Take 1 tablet (10 mg total) by mouth daily. 02/05/15   Colon Branch, MD  azelastine (ASTELIN) 0.1 % nasal spray Place 2 sprays into both nostrils at bedtime as needed for rhinitis. Use in each nostril as directed 03/04/15   Colon Branch, MD  azelastine (ASTELIN) 0.1 % nasal spray Place 2 sprays into both nostrils 2 (two) times daily. Use in each nostril as directed    Historical Provider, MD  cromolyn (NASALCROM) 5.2 MG/ACT nasal spray Place 1 spray into both nostrils 4 (four) times daily. 03/26/14   Colon Branch, MD  Cyanocobalamin (B-12 PO) Take 1 tablet by  mouth daily.     Historical Provider, MD  EPINEPHrine 0.3 mg/0.3 mL IJ SOAJ injection Inject 0.3 mg into the muscle once. Reported on 03/18/2015    Historical Provider, MD  fluticasone (FLONASE) 50 MCG/ACT nasal spray USE 1 TO 2 SPRAYS IN EACH NOSTRIL DAILY 06/13/14   Deneise Lever, MD  fluticasone (FLOVENT HFA) 44 MCG/ACT inhaler Inhale 2 puffs into the lungs 2 (two) times daily. 07/19/13   Deneise Lever, MD  furosemide (LASIX) 20 MG tablet Take 2 tablets (40 mg total) by mouth daily. 02/05/15   Colon Branch, MD  Insulin Lispro Prot & Lispro (HUMALOG MIX 75/25 KWIKPEN) (75-25) 100 UNIT/ML Kwikpen 50 units with breakfast, and 10 units with the evening meal, and pen needles 2/day 01/20/15   Renato Shin, MD  loratadine (CLARITIN) 10 MG tablet Take 10 mg by mouth daily.    Historical Provider, MD  losartan (COZAAR) 50 MG tablet Take 1 tablet (50 mg total) by mouth daily. 01/28/15   Colon Branch, MD  Multiple Vitamins-Minerals (CENTRUM SILVER PO) Take 1 tablet by mouth daily.      Historical Provider, MD  omeprazole (PRILOSEC) 20 MG capsule Take 1 capsule (20 mg total) by mouth daily. 02/05/15   Colon Branch, MD  Polyethyl Glycol-Propyl Glycol (SYSTANE) 0.4-0.3 % SOLN Apply 1 drop to eye daily as needed (for dry eyes).    Historical Provider, MD  SURE COMFORT PEN NEEDLES 31G X 5 MM MISC Reported on 03/18/2015 10/15/14   Historical Provider, MD   BP 144/68 mmHg  Pulse 95  Temp(Src) 98.1 F (36.7 C) (Oral)  Resp 16  SpO2 99% Physical Exam  Constitutional: She is oriented to person, place, and time. She appears well-developed and well-nourished.  Non-toxic appearance. She does not have a sickly appearance. She does not appear ill.  HENT:  Head: Normocephalic and atraumatic.  Mouth/Throat: Oropharynx is clear and moist.  Eyes: Conjunctivae are normal. Pupils are equal, round, and reactive to light.  Neck: Normal range of motion. Neck supple.  Cardiovascular: Normal rate, regular rhythm and normal heart sounds.   No murmur heard. Pulmonary/Chest: Effort normal and breath sounds normal. No accessory muscle usage or stridor. No respiratory distress. She has no wheezes. She has no rhonchi. She has no rales.  Abdominal: Soft. Bowel sounds are normal. She exhibits no distension. There is no tenderness. There is no rebound and no guarding.  Musculoskeletal: Normal range of motion.  Lymphadenopathy:    She has no cervical adenopathy.  Neurological: She is alert and oriented to person, place, and time.  Speech clear without dysarthria.  Skin: Skin is warm and dry.  Psychiatric: She has a normal mood and affect. Her behavior is normal.    ED Course  Procedures   DIAGNOSTIC STUDIES: Oxygen  Saturation is 99% on RA, normal by my interpretation.    COORDINATION OF CARE: 8:28 PM Discussed lab results. Will prescribe Keflex. Discussed treatment plan with pt at bedside and pt agreed to plan.   Labs Review Labs Reviewed  URINALYSIS, ROUTINE W REFLEX MICROSCOPIC (NOT AT [facility]) - Abnormal; Notable for the following:    APPearance HAZY (*)    Hgb urine dipstick LARGE (*)    Leukocytes, UA LARGE (*)    All other components within normal limits  URINE MICROSCOPIC-ADD ON - Abnormal; Notable for the following:    Squamous Epithelial / LPF 0-5 (*)    Bacteria, UA RARE (*)    All other  components within normal limits   I have personally reviewed and evaluated these lab results as part of my medical decision-making.   MDM   Final diagnoses:  UTI (lower urinary tract infection)    Pt diagnosed with a UTI. Pt is afebrile, tachycardia, hypotension, or other signs of serious infection.  Well appearing, non-toxic.  Abdomen soft and benign. Vitals reassuring.  No indication for further labs or imaging.  Doubt pyelonephritis, urolithiasis, or other acute intra-abdominal pathology.  Pt to be dc home with antibiotics and instructions to follow up with PCP if symptoms persist. Discussed return precautions. Pt appears safe for discharge.  I personally performed the services described in this documentation, which was scribed in my presence. The recorded information has been reviewed and is accurate.    Gloriann Loan, PA-C 06/16/15 2102  Forde Dandy, MD 06/17/15 [phone]

## 2015-06-16 NOTE — ED Notes (Signed)
Pt reports hematuria and dysuria x 3 days. No other symptoms. Pt alert x4. NAD at this time.

## 2015-06-17 DIAGNOSIS — J3089 Other allergic rhinitis: Secondary | ICD-10-CM | POA: Diagnosis not present

## 2015-06-24 DIAGNOSIS — J3089 Other allergic rhinitis: Secondary | ICD-10-CM | POA: Diagnosis not present

## 2015-06-30 ENCOUNTER — Ambulatory Visit (INDEPENDENT_AMBULATORY_CARE_PROVIDER_SITE_OTHER): Payer: Medicare Other | Admitting: Internal Medicine

## 2015-06-30 ENCOUNTER — Encounter: Payer: Self-pay | Admitting: Internal Medicine

## 2015-06-30 DIAGNOSIS — E785 Hyperlipidemia, unspecified: Secondary | ICD-10-CM

## 2015-06-30 DIAGNOSIS — I10 Essential (primary) hypertension: Secondary | ICD-10-CM | POA: Diagnosis not present

## 2015-06-30 DIAGNOSIS — Z09 Encounter for follow-up examination after completed treatment for conditions other than malignant neoplasm: Secondary | ICD-10-CM

## 2015-06-30 NOTE — Progress Notes (Signed)
Subjective:    Patient ID: Angel Green, female    DOB: 1940/04/20, 76 y.o.   MRN: [id number]  DOS:  06/30/2015 Type of visit - description :  Routine checkup Interval history:  Recently went to the ER, DX with a UTI, no urine culture done, took Keflex, all symptoms resolved. Obesity: Concerned about her weight, states she already eats healthy, but has not been active lately  Review of Systems No fever chills or dysuria No chest pain or difficulty breathing No nausea, vomiting, diarrhea  Past Medical History  Diagnosis Date  . Hypertension   . Diabetes mellitus, type 2 ([place])   . Hyperlipidemia   . Osteoarthritis   . Ankle pain     chronic  . Rhinitis     allergic nos  . RENAL INSUFFICIENCY, CHRONIC 04/16/2010  . Allergy     Dust Mites    Past Surgical History  Procedure Laterality Date  . Cholecystectomy    . Partial hysterectomy      in her 71s  . Dental implants    . Cataract extraction      Social History   Social History  . Marital Status: Married    Spouse Name: N/A  . Number of Children: 2  . Years of Education: N/A   Occupational History  .  retired    Social History Main Topics  . Smoking status: Never Smoker   . Smokeless tobacco: Never Used  . Alcohol Use: No  . Drug Use: No  . Sexual Activity:    Partners: Male   Other Topics Concern  . Not on file   Social History Narrative    husband is a Company secretary                    Medication List       This list is accurate as of: 06/30/15  4:38 PM.  Always use your most recent med list.               amLODipine 5 MG tablet  Commonly known as:  NORVASC  Take 1 tablet (5 mg total) by mouth daily.     aspirin 81 MG tablet  Take 81 mg by mouth daily.     atorvastatin 10 MG tablet  Commonly known as:  LIPITOR  Take 1 tablet (10 mg total) by mouth daily.     azelastine 0.1 % nasal spray  Commonly known as:  ASTELIN  Place 2 sprays into both nostrils at bedtime as needed for  rhinitis. Use in each nostril as directed     B-12 PO  Take 1 tablet by mouth daily.     CENTRUM SILVER PO  Take 1 tablet by mouth daily.     cromolyn 5.2 MG/ACT nasal spray  Commonly known as:  NASALCROM  Place 1 spray into both nostrils 4 (four) times daily.     EPINEPHrine 0.3 mg/0.3 mL Soaj injection  Commonly known as:  EPI-PEN  Inject 0.3 mg into the muscle once. Reported on 06/30/2015     fluticasone 44 MCG/ACT inhaler  Commonly known as:  FLOVENT HFA  Inhale 2 puffs into the lungs 2 (two) times daily.     fluticasone 50 MCG/ACT nasal spray  Commonly known as:  FLONASE  USE 1 TO 2 SPRAYS IN EACH NOSTRIL DAILY     furosemide 20 MG tablet  Commonly known as:  LASIX  Take 2 tablets (40 mg total) by mouth daily.  Insulin Lispro Prot & Lispro (75-25) 100 UNIT/ML Kwikpen  Commonly known as:  HUMALOG MIX 75/25 KWIKPEN  50 units with breakfast, and 10 units with the evening meal, and pen needles 2/day     loratadine 10 MG tablet  Commonly known as:  CLARITIN  Take 10 mg by mouth daily.     losartan 50 MG tablet  Commonly known as:  COZAAR  Take 1 tablet (50 mg total) by mouth daily.     omeprazole 20 MG capsule  Commonly known as:  PRILOSEC  Take 1 capsule (20 mg total) by mouth daily.     SURE COMFORT PEN NEEDLES 31G X 5 MM Misc  Generic drug:  Insulin Pen Needle  Reported on 06/30/2015     SYSTANE 0.4-0.3 % Soln  Generic drug:  Polyethyl Glycol-Propyl Glycol  Apply 1 drop to eye daily as needed (for dry eyes).           Objective:   Physical Exam BP 128/78 mmHg  Pulse 85  Temp(Src) 97.5 F (36.4 C) (Oral)  Ht 5\' 5"  (1.651 m)  Wt 232 lb (105.235 kg)  BMI 38.61 kg/m2  SpO2 97% General:   Well developed, overweight appearing . NAD.  HEENT:  Normocephalic . Face symmetric, atraumatic Lungs:  CTA B Normal respiratory effort, no intercostal retractions, no accessory muscle use. Heart: RRR,  no murmur.  No pretibial edema bilaterally  Skin: Not  pale. Not jaundice Neurologic:  alert & oriented X3.  Speech normal, gait appropriate for age and unassisted Psych--  Cognition and judgment appear intact.  Cooperative with normal attention span and concentration.  Behavior appropriate. No anxious or depressed appearing.      Assessment & Plan:   Assessment>  DM  Dr Loanne Drilling + Neuropathy: Per foot exam 12-2014 HTN ---change ACE to ARB is 03/2014 Hyperlipidemia CRI  dx 2012  Sees Dr Arty Baumgartner  Morbid obesity DJD, had  chronic ankle pain Allergies -- dust mites , occ uses a inhaler , sees allergist, has shots q weeks started ~ 08-2014  Plan: UTI: s/p Keflex,   clinically resolved HTN: Well-controlled, last BMP satisfactory. Continue losartan. Hyperlipidemia: Well-controlled, continue Lipitor Morbid obesity: The patient weights 232 pounds, her weight has been the same for the last 10 or 15 years, wonders about "weight loss medications". My advice was to continue focusing on a healthy diet, try to increase her physical activity gradually and safely. She may benefit from a medication such as victoza, rec to d/w endo. RTC 12-2015, cpx  Today, I spent more than 16  min with the patient: >50% of the time counseling regards obesity, treatment options, diet and exercise

## 2015-06-30 NOTE — Assessment & Plan Note (Signed)
UTI: s/p Keflex,   clinically resolved HTN: Well-controlled, last BMP satisfactory. Continue losartan. Hyperlipidemia: Well-controlled, continue Lipitor Morbid obesity: The patient weights 232 pounds, her weight has been the same for the last 10 or 15 years, wonders about "weight loss medications". My advice was to continue focusing on a healthy diet, try to increase her physical activity gradually and safely. She may benefit from a medication such as victoza, rec to d/w endo. RTC 12-2015, cpx

## 2015-06-30 NOTE — Patient Instructions (Signed)
  GO TO THE FRONT DESK Schedule your next appointment for a  Physical exam in 6 months, fasting     

## 2015-06-30 NOTE — Progress Notes (Signed)
Pre visit review using our clinic review tool, if applicable. No additional management support is needed unless otherwise documented below in the visit note. 

## 2015-07-01 DIAGNOSIS — J3089 Other allergic rhinitis: Secondary | ICD-10-CM | POA: Diagnosis not present

## 2015-07-08 DIAGNOSIS — J3089 Other allergic rhinitis: Secondary | ICD-10-CM | POA: Diagnosis not present

## 2015-07-15 DIAGNOSIS — J3089 Other allergic rhinitis: Secondary | ICD-10-CM | POA: Diagnosis not present

## 2015-07-22 ENCOUNTER — Ambulatory Visit (INDEPENDENT_AMBULATORY_CARE_PROVIDER_SITE_OTHER): Payer: Medicare Other | Admitting: Endocrinology

## 2015-07-22 ENCOUNTER — Encounter: Payer: Self-pay | Admitting: Endocrinology

## 2015-07-22 VITALS — BP 116/78 | HR 96 | Temp 97.6°F | Ht 65.0 in | Wt 231.0 lb

## 2015-07-22 DIAGNOSIS — E119 Type 2 diabetes mellitus without complications: Secondary | ICD-10-CM | POA: Diagnosis not present

## 2015-07-22 DIAGNOSIS — J3089 Other allergic rhinitis: Secondary | ICD-10-CM | POA: Diagnosis not present

## 2015-07-22 LAB — POCT GLYCOSYLATED HEMOGLOBIN (HGB A1C): HEMOGLOBIN A1C: 7.8

## 2015-07-22 MED ORDER — ALBIGLUTIDE 30 MG ~~LOC~~ PEN: 30.0000 mg | PEN_INJECTOR | SUBCUTANEOUS | Status: DC

## 2015-07-22 MED ORDER — INSULIN LISPRO PROT & LISPRO (75-25 MIX) 100 UNIT/ML KWIKPEN: 40.0000 [IU] | PEN_INJECTOR | Freq: Every day | SUBCUTANEOUS | Status: DC

## 2015-07-22 NOTE — Patient Instructions (Addendum)
i have sent a prescription to your pharmacy, to add "tanzeum." Also, please reduce the insulin to 40 units with breakfast, and none with supper.  check your blood sugar twice a day.  vary the time of day when you check, between before the 3 meals, and at bedtime.  also check if you have symptoms of your blood sugar being too high or too low.  please keep a record of the readings and bring it to your next appointment here (or you can bring the meter itself).  You can write it on any piece of paper.  please call us sooner if your blood sugar goes below 70, or if you have a lot of readings over 200.   Please come back for a follow-up appointment in 3 months.

## 2015-07-22 NOTE — Progress Notes (Signed)
Subjective:    Patient ID: Angel Green, female    DOB: 1940-11-24, 75 y.o.   MRN: [id number]  HPI Pt returns for f/u of diabetes mellitus: DM type: Insulin-requiring type 2 Dx'ed: [id number] Complications: neuropathy of the lower extremities, and renal insufficiency.  Therapy: insulin since 2010.  GDM: never. DKA: never Severe hypoglycemia: never.  Pancreatitis: never Other: therapy has been limited by variable cbg's; she changed to BID premixed insulin, after poor results with multiple daily injections.   Interval history:  no cbg record, but states cbg's are well-controlled Past Medical History  Diagnosis Date  . Hypertension   . Diabetes mellitus, type 2 ([place])   . Hyperlipidemia   . Osteoarthritis   . Ankle pain     chronic  . Rhinitis     allergic nos  . RENAL INSUFFICIENCY, CHRONIC 04/16/2010  . Allergy     Dust Mites    Past Surgical History  Procedure Laterality Date  . Cholecystectomy    . Partial hysterectomy      in her 68s  . Dental implants    . Cataract extraction      Social History   Social History  . Marital Status: Married    Spouse Name: N/A  . Number of Children: 2  . Years of Education: N/A   Occupational History  .  retired    Social History Main Topics  . Smoking status: Never Smoker   . Smokeless tobacco: Never Used  . Alcohol Use: No  . Drug Use: No  . Sexual Activity:    Partners: Male   Other Topics Concern  . Not on file   Social History Narrative    husband is a Company secretary                Current Outpatient Prescriptions on File Prior to Visit  Medication Sig Dispense Refill  . amLODipine (NORVASC) 5 MG tablet Take 1 tablet (5 mg total) by mouth daily. 90 tablet 2  . aspirin 81 MG tablet Take 81 mg by mouth daily.      Marland Kitchen atorvastatin (LIPITOR) 10 MG tablet Take 1 tablet (10 mg total) by mouth daily. 90 tablet 2  . azelastine (ASTELIN) 0.1 % nasal spray Place 2 sprays into both nostrils at bedtime as needed for rhinitis.  Use in each nostril as directed 30 mL 3  . cromolyn (NASALCROM) 5.2 MG/ACT nasal spray Place 1 spray into both nostrils 4 (four) times daily. 26 mL 12  . Cyanocobalamin (B-12 PO) Take 1 tablet by mouth daily.     Marland Kitchen EPINEPHrine 0.3 mg/0.3 mL IJ SOAJ injection Inject 0.3 mg into the muscle once. Reported on 06/30/2015    . fluticasone (FLONASE) 50 MCG/ACT nasal spray USE 1 TO 2 SPRAYS IN EACH NOSTRIL DAILY 16 g 3  . fluticasone (FLOVENT HFA) 44 MCG/ACT inhaler Inhale 2 puffs into the lungs 2 (two) times daily. 3 Inhaler 3  . furosemide (LASIX) 20 MG tablet Take 2 tablets (40 mg total) by mouth daily. 180 tablet 2  . loratadine (CLARITIN) 10 MG tablet Take 10 mg by mouth daily.    Marland Kitchen losartan (COZAAR) 50 MG tablet Take 1 tablet (50 mg total) by mouth daily. 90 tablet 2  . Multiple Vitamins-Minerals (CENTRUM SILVER PO) Take 1 tablet by mouth daily.      Marland Kitchen omeprazole (PRILOSEC) 20 MG capsule Take 1 capsule (20 mg total) by mouth daily. 90 capsule 2  . Polyethyl Glycol-Propyl Glycol (SYSTANE) 0.4-0.3 %  SOLN Apply 1 drop to eye daily as needed (for dry eyes).    Haig Prophet COMFORT PEN NEEDLES 31G X 5 MM MISC Reported on 06/30/2015     No current facility-administered medications on file prior to visit.    No Known Allergies  Family History  Problem Relation Age of Onset  . Heart attack Neg Hx   . Colon cancer Neg Hx   . Breast cancer Neg Hx   . Stroke Neg Hx   . Healthy Brother   . Healthy Son   . Healthy Son     BP 116/78 mmHg  Pulse 96  Temp(Src) 97.6 F (36.4 C) (Oral)  Ht 5\' 5"  (1.651 m)  Wt 231 lb (104.781 kg)  BMI 38.44 kg/m2  SpO2 93%   Review of Systems She has gained weight    Objective:   Physical Exam VITAL SIGNS:  See vs page GENERAL: no distress Pulses: dorsalis pedis intact bilat.   MSK: no deformity of the feet CV: no leg edema Skin:  no ulcer on the feet.  normal color and temp on the feet.  Old healed surgical scars on both feet (bunions).   Neuro: sensation is  intact to touch on the feet    A1c=7.8%    Assessment & Plan:  Insulin-requiring type 2 DM: worse Obesity: persistent.  Patient is advised the following: Patient Instructions  i have sent a prescription to your pharmacy, to add "tanzeum." Also, please reduce the insulin to 40 units with breakfast, and none with supper.  check your blood sugar twice a day.  vary the time of day when you check, between before the 3 meals, and at bedtime.  also check if you have symptoms of your blood sugar being too high or too low.  please keep a record of the readings and bring it to your next appointment here (or you can bring the meter itself).  You can write it on any piece of paper.  please call us sooner if your blood sugar goes below 70, or if you have a lot of readings over 200.   Please come back for a follow-up appointment in 3 months.    Renato Shin

## 2015-07-24 ENCOUNTER — Other Ambulatory Visit: Payer: Self-pay

## 2015-07-24 MED ORDER — INSULIN LISPRO PROT & LISPRO (75-25 MIX) 100 UNIT/ML KWIKPEN: 40.0000 [IU] | PEN_INJECTOR | Freq: Every day | SUBCUTANEOUS | Status: DC

## 2015-07-24 MED ORDER — SURE COMFORT PEN NEEDLES 31G X 5 MM MISC: Status: DC

## 2015-07-25 DIAGNOSIS — Z7689 Persons encountering health services in other specified circumstances: Secondary | ICD-10-CM

## 2015-07-29 DIAGNOSIS — J3089 Other allergic rhinitis: Secondary | ICD-10-CM | POA: Diagnosis not present

## 2015-08-05 DIAGNOSIS — J3089 Other allergic rhinitis: Secondary | ICD-10-CM | POA: Diagnosis not present

## 2015-08-12 DIAGNOSIS — J3089 Other allergic rhinitis: Secondary | ICD-10-CM | POA: Diagnosis not present

## 2015-08-19 DIAGNOSIS — J3089 Other allergic rhinitis: Secondary | ICD-10-CM | POA: Diagnosis not present

## 2015-08-21 ENCOUNTER — Telehealth: Payer: Self-pay | Admitting: Endocrinology

## 2015-08-21 ENCOUNTER — Other Ambulatory Visit: Payer: Self-pay

## 2015-08-21 MED ORDER — ALBIGLUTIDE 30 MG ~~LOC~~ PEN: 30.0000 mg | PEN_INJECTOR | SUBCUTANEOUS | Status: DC

## 2015-08-21 NOTE — Telephone Encounter (Signed)
Energy level is not good since the start of tanzeum she would like to know how she can adjust this  Also the pt is still determined in thinking she was supposed to have a diet med, let pt know there is nothing in the system regarding this and the MD will have to return before we can get this done for her

## 2015-08-21 NOTE — Telephone Encounter (Signed)
I contacted the pt and advised on 07/22/2015 Dr.Ellison [facility]. Pt stated she has not received the medication yet. Pt advised we would send a 30 day supply to her local pharmacy and a 90 day to her mail order. Pt advised to call back if she has any issues picking the medication up. Pt voiced understanding.

## 2015-08-21 NOTE — Telephone Encounter (Signed)
I contacted the pt and advised the Tanzeum is the medication Dr. Loanne Drilling prescribed to help with the blood sugar and weight loss. After discussing this with the pt she voiced understanding and stated she had no further questions.

## 2015-08-21 NOTE — Telephone Encounter (Signed)
Pt asking for assistance with the weight management pill she thought it was supposed to be called in?

## 2015-08-27 DIAGNOSIS — J3089 Other allergic rhinitis: Secondary | ICD-10-CM | POA: Diagnosis not present

## 2015-09-02 DIAGNOSIS — J3089 Other allergic rhinitis: Secondary | ICD-10-CM | POA: Diagnosis not present

## 2015-09-08 DIAGNOSIS — J3089 Other allergic rhinitis: Secondary | ICD-10-CM | POA: Diagnosis not present

## 2015-09-15 DIAGNOSIS — J3089 Other allergic rhinitis: Secondary | ICD-10-CM | POA: Diagnosis not present

## 2015-09-22 DIAGNOSIS — J3089 Other allergic rhinitis: Secondary | ICD-10-CM | POA: Diagnosis not present

## 2015-09-29 DIAGNOSIS — J3089 Other allergic rhinitis: Secondary | ICD-10-CM | POA: Diagnosis not present

## 2015-10-07 DIAGNOSIS — J3089 Other allergic rhinitis: Secondary | ICD-10-CM | POA: Diagnosis not present

## 2015-10-14 DIAGNOSIS — J3089 Other allergic rhinitis: Secondary | ICD-10-CM | POA: Diagnosis not present

## 2015-10-20 DIAGNOSIS — J3089 Other allergic rhinitis: Secondary | ICD-10-CM | POA: Diagnosis not present

## 2015-10-22 ENCOUNTER — Ambulatory Visit (INDEPENDENT_AMBULATORY_CARE_PROVIDER_SITE_OTHER): Payer: Medicare Other | Admitting: Endocrinology

## 2015-10-22 ENCOUNTER — Encounter: Payer: Self-pay | Admitting: Endocrinology

## 2015-10-22 VITALS — BP 119/64 | Ht 65.0 in | Wt 226.0 lb

## 2015-10-22 DIAGNOSIS — E119 Type 2 diabetes mellitus without complications: Secondary | ICD-10-CM

## 2015-10-22 LAB — POCT GLYCOSYLATED HEMOGLOBIN (HGB A1C): HEMOGLOBIN A1C: 7.5

## 2015-10-22 NOTE — Progress Notes (Signed)
Subjective:    Patient ID: Angel Green, female    DOB: June 05, 1940, 75 y.o.   MRN: [id number]  HPI Pt returns for f/u of diabetes mellitus: DM type: Insulin-requiring type 2 Dx'ed: [id number] Complications: neuropathy of the lower extremities, and renal insufficiency.  Therapy: insulin since 2010, and tanzeum.  GDM: never. DKA: never Severe hypoglycemia: never.  Pancreatitis: never.  Other: therapy has been limited by variable cbg's; she changed to QAM premixed insulin, after poor results with multiple daily injections; pattern of cbg's indicates she does not need PM dose.   Interval history:  no cbg record, but states cbg's are well-controlled.  It is in general higher as the day goes on.   pt states she feels well in general.  She has lost a few lbs.   Past Medical History:  Diagnosis Date  . Allergy    Dust Mites  . Ankle pain    chronic  . Diabetes mellitus, type 2 ([place])   . Hyperlipidemia   . Hypertension   . Osteoarthritis   . RENAL INSUFFICIENCY, CHRONIC 04/16/2010  . Rhinitis    allergic nos    Past Surgical History:  Procedure Laterality Date  . CATARACT EXTRACTION    . CHOLECYSTECTOMY    . dental implants    . PARTIAL HYSTERECTOMY     in her 26s    Social History   Social History  . Marital status: Married    Spouse name: N/A  . Number of children: 2  . Years of education: N/A   Occupational History  .  retired    Social History Main Topics  . Smoking status: Never Smoker  . Smokeless tobacco: Never Used  . Alcohol use No  . Drug use: No  . Sexual activity: Yes    Partners: Male   Other Topics Concern  . Not on file   Social History Narrative    husband is a Company secretary                Current Outpatient Prescriptions on File Prior to Visit  Medication Sig Dispense Refill  . Albiglutide (TANZEUM) 30 MG PEN Inject 30 mg into the skin once a week. 12 each 0  . amLODipine (NORVASC) 5 MG tablet Take 1 tablet (5 mg total) by mouth daily. 90  tablet 2  . aspirin 81 MG tablet Take 81 mg by mouth daily.      Marland Kitchen atorvastatin (LIPITOR) 10 MG tablet Take 1 tablet (10 mg total) by mouth daily. 90 tablet 2  . azelastine (ASTELIN) 0.1 % nasal spray Place 2 sprays into both nostrils at bedtime as needed for rhinitis. Use in each nostril as directed 30 mL 3  . cromolyn (NASALCROM) 5.2 MG/ACT nasal spray Place 1 spray into both nostrils 4 (four) times daily. 26 mL 12  . Cyanocobalamin (B-12 PO) Take 1 tablet by mouth daily.     Marland Kitchen EPINEPHrine 0.3 mg/0.3 mL IJ SOAJ injection Inject 0.3 mg into the muscle once. Reported on 06/30/2015    . fluticasone (FLONASE) 50 MCG/ACT nasal spray USE 1 TO 2 SPRAYS IN EACH NOSTRIL DAILY 16 g 3  . fluticasone (FLOVENT HFA) 44 MCG/ACT inhaler Inhale 2 puffs into the lungs 2 (two) times daily. 3 Inhaler 3  . furosemide (LASIX) 20 MG tablet Take 2 tablets (40 mg total) by mouth daily. 180 tablet 2  . Insulin Lispro Prot & Lispro (HUMALOG MIX 75/25 KWIKPEN) (75-25) 100 UNIT/ML Kwikpen Inject 40 Units into the  skin daily with breakfast. 45 mL 5  . loratadine (CLARITIN) 10 MG tablet Take 10 mg by mouth daily.    Marland Kitchen losartan (COZAAR) 50 MG tablet Take 1 tablet (50 mg total) by mouth daily. 90 tablet 2  . Multiple Vitamins-Minerals (CENTRUM SILVER PO) Take 1 tablet by mouth daily.      Marland Kitchen omeprazole (PRILOSEC) 20 MG capsule Take 1 capsule (20 mg total) by mouth daily. 90 capsule 2  . Polyethyl Glycol-Propyl Glycol (SYSTANE) 0.4-0.3 % SOLN Apply 1 drop to eye daily as needed (for dry eyes).    . SURE COMFORT PEN NEEDLES 31G X 5 MM MISC Use to inject insulin 1 time per day. 100 each 2   No current facility-administered medications on file prior to visit.     No Known Allergies  Family History  Problem Relation Age of Onset  . Heart attack Neg Hx   . Colon cancer Neg Hx   . Breast cancer Neg Hx   . Stroke Neg Hx   . Healthy Brother   . Healthy Son   . Healthy Son     BP 119/64   Ht 5\' 5"  (1.651 m)   Wt 226 lb  (102.5 kg)   BMI 37.61 kg/m   Review of Systems She denies hypoglycemia.     Objective:   Physical Exam VITAL SIGNS:  See vs page GENERAL: no distress Pulses: dorsalis pedis intact bilat.   MSK: no deformity of the feet CV: no leg edema Skin:  no ulcer on the feet.  normal color and temp on the feet.   Neuro: sensation is intact to touch on the feet.   Lab Results  Component Value Date   HGBA1C 7.5 10/22/2015      Assessment & Plan:  Type 2 DM: this is the best control this pt should aim for, given this regimen, which does match insulin to her changing needs throughout the day

## 2015-10-22 NOTE — Patient Instructions (Addendum)
Please continue the same medications for diabetes.  check your blood sugar twice a day.  vary the time of day when you check, between before the 3 meals, and at bedtime.  also check if you have symptoms of your blood sugar being too high or too low.  please keep a record of the readings and bring it to your next appointment here (or you can bring the meter itself).  You can write it on any piece of paper.  please call us sooner if your blood sugar goes below 70, or if you have a lot of readings over 200.   Please come back for a follow-up appointment in 4-5 months.       

## 2015-10-29 DIAGNOSIS — J3089 Other allergic rhinitis: Secondary | ICD-10-CM | POA: Diagnosis not present

## 2015-11-04 DIAGNOSIS — J3089 Other allergic rhinitis: Secondary | ICD-10-CM | POA: Diagnosis not present

## 2015-11-11 DIAGNOSIS — J3089 Other allergic rhinitis: Secondary | ICD-10-CM | POA: Diagnosis not present

## 2015-11-18 DIAGNOSIS — J3089 Other allergic rhinitis: Secondary | ICD-10-CM | POA: Diagnosis not present

## 2015-11-20 DIAGNOSIS — J3089 Other allergic rhinitis: Secondary | ICD-10-CM | POA: Diagnosis not present

## 2015-11-26 DIAGNOSIS — K219 Gastro-esophageal reflux disease without esophagitis: Secondary | ICD-10-CM | POA: Diagnosis not present

## 2015-11-26 DIAGNOSIS — J3089 Other allergic rhinitis: Secondary | ICD-10-CM | POA: Diagnosis not present

## 2015-11-26 DIAGNOSIS — R05 Cough: Secondary | ICD-10-CM | POA: Diagnosis not present

## 2015-12-02 ENCOUNTER — Ambulatory Visit (INDEPENDENT_AMBULATORY_CARE_PROVIDER_SITE_OTHER): Payer: Medicare Other

## 2015-12-02 DIAGNOSIS — J3089 Other allergic rhinitis: Secondary | ICD-10-CM | POA: Diagnosis not present

## 2015-12-02 DIAGNOSIS — Z23 Encounter for immunization: Secondary | ICD-10-CM

## 2015-12-08 ENCOUNTER — Other Ambulatory Visit: Payer: Self-pay | Admitting: Internal Medicine

## 2015-12-08 DIAGNOSIS — Z1231 Encounter for screening mammogram for malignant neoplasm of breast: Secondary | ICD-10-CM

## 2015-12-09 DIAGNOSIS — J3089 Other allergic rhinitis: Secondary | ICD-10-CM | POA: Diagnosis not present

## 2015-12-16 ENCOUNTER — Other Ambulatory Visit: Payer: Self-pay | Admitting: Endocrinology

## 2015-12-16 DIAGNOSIS — J3089 Other allergic rhinitis: Secondary | ICD-10-CM | POA: Diagnosis not present

## 2015-12-18 DIAGNOSIS — N183 Chronic kidney disease, stage 3 (moderate): Secondary | ICD-10-CM | POA: Diagnosis not present

## 2015-12-18 DIAGNOSIS — I129 Hypertensive chronic kidney disease with stage 1 through stage 4 chronic kidney disease, or unspecified chronic kidney disease: Secondary | ICD-10-CM | POA: Diagnosis not present

## 2015-12-18 DIAGNOSIS — D631 Anemia in chronic kidney disease: Secondary | ICD-10-CM | POA: Diagnosis not present

## 2015-12-18 DIAGNOSIS — N189 Chronic kidney disease, unspecified: Secondary | ICD-10-CM | POA: Diagnosis not present

## 2015-12-18 DIAGNOSIS — N2581 Secondary hyperparathyroidism of renal origin: Secondary | ICD-10-CM | POA: Diagnosis not present

## 2015-12-18 DIAGNOSIS — E785 Hyperlipidemia, unspecified: Secondary | ICD-10-CM | POA: Diagnosis not present

## 2015-12-18 DIAGNOSIS — Z6841 Body Mass Index (BMI) 40.0 and over, adult: Secondary | ICD-10-CM | POA: Diagnosis not present

## 2015-12-18 LAB — CBC AND DIFFERENTIAL
HEMATOCRIT: 43 % (ref 36–46)
HEMOGLOBIN: 14.7 g/dL (ref 12.0–16.0)
Neutrophils Absolute: 3 /uL
Platelets: 253 10*3/uL (ref 150–399)
WBC: 6.7 10*3/mL

## 2015-12-18 LAB — HEPATIC FUNCTION PANEL
ALK PHOS: 96 U/L (ref 25–125)
ALT: 16 U/L (ref 7–35)
AST: 20 U/L (ref 13–35)
Bilirubin, Total: 0.2 mg/dL

## 2015-12-18 LAB — BASIC METABOLIC PANEL
BUN: 17 mg/dL (ref 4–21)
Creatinine: 1.3 mg/dL — AB (ref 0.5–1.1)
Glucose: 262 mg/dL
POTASSIUM: 4.1 mmol/L (ref 3.4–5.3)
SODIUM: 136 mmol/L — AB (ref 137–147)

## 2015-12-19 ENCOUNTER — Ambulatory Visit: Payer: Medicare Other | Admitting: *Deleted

## 2015-12-23 DIAGNOSIS — J3089 Other allergic rhinitis: Secondary | ICD-10-CM | POA: Diagnosis not present

## 2015-12-26 ENCOUNTER — Encounter: Payer: Self-pay | Admitting: Internal Medicine

## 2015-12-30 DIAGNOSIS — J3089 Other allergic rhinitis: Secondary | ICD-10-CM | POA: Diagnosis not present

## 2015-12-31 ENCOUNTER — Encounter: Payer: Medicare Other | Admitting: Internal Medicine

## 2016-01-01 ENCOUNTER — Telehealth: Payer: Self-pay | Admitting: Internal Medicine

## 2016-01-01 ENCOUNTER — Encounter: Payer: Medicare Other | Admitting: Internal Medicine

## 2016-01-01 NOTE — Telephone Encounter (Signed)
Patient left message on VM today @ 12:01 stating she got lost trying to get to her appointment and had to turn around and go back home. Rescheduled for 03/05/16. Charge or No Charge?

## 2016-01-01 NOTE — Telephone Encounter (Signed)
No charge. 

## 2016-01-06 DIAGNOSIS — J3089 Other allergic rhinitis: Secondary | ICD-10-CM | POA: Diagnosis not present

## 2016-01-08 ENCOUNTER — Telehealth: Payer: Self-pay | Admitting: Endocrinology

## 2016-01-08 NOTE — Telephone Encounter (Signed)
Pt has been a pt with Dr. Loanne Drilling for a long time and has never received a charge for paperwork to be done, she is asking if the form charge for the paperwork we completed for her in June for the Tanzeum can be removed, please advise

## 2016-01-08 NOTE — Telephone Encounter (Signed)
This is not my decision--this is an Publishing copy.

## 2016-01-12 ENCOUNTER — Ambulatory Visit
Admission: RE | Admit: 2016-01-12 | Discharge: 2016-01-12 | Disposition: A | Discharge status: Home / Self Care | Payer: Medicare Other | Source: Ambulatory Visit | Attending: Internal Medicine | Admitting: Internal Medicine

## 2016-01-12 DIAGNOSIS — Z1231 Encounter for screening mammogram for malignant neoplasm of breast: Secondary | ICD-10-CM

## 2016-01-12 DIAGNOSIS — J3089 Other allergic rhinitis: Secondary | ICD-10-CM | POA: Diagnosis not present

## 2016-01-20 DIAGNOSIS — J301 Allergic rhinitis due to pollen: Secondary | ICD-10-CM | POA: Diagnosis not present

## 2016-01-27 DIAGNOSIS — J3089 Other allergic rhinitis: Secondary | ICD-10-CM | POA: Diagnosis not present

## 2016-02-03 DIAGNOSIS — J3089 Other allergic rhinitis: Secondary | ICD-10-CM | POA: Diagnosis not present

## 2016-02-09 ENCOUNTER — Telehealth: Payer: Self-pay | Admitting: *Deleted

## 2016-02-09 DIAGNOSIS — J3089 Other allergic rhinitis: Secondary | ICD-10-CM | POA: Diagnosis not present

## 2016-02-09 NOTE — Telephone Encounter (Signed)
Scheduled appt 03/05/16 @3pm .

## 2016-02-19 DIAGNOSIS — J3089 Other allergic rhinitis: Secondary | ICD-10-CM | POA: Diagnosis not present

## 2016-02-26 DIAGNOSIS — J3089 Other allergic rhinitis: Secondary | ICD-10-CM | POA: Diagnosis not present

## 2016-03-02 DIAGNOSIS — J3089 Other allergic rhinitis: Secondary | ICD-10-CM | POA: Diagnosis not present

## 2016-03-04 NOTE — Progress Notes (Signed)
Pre visit review using our clinic review tool, if applicable. No additional management support is needed unless otherwise documented below in the visit note. 

## 2016-03-04 NOTE — Progress Notes (Signed)
Subjective:   Angel Green is a 76 y.o. female who presents for Medicare Annual (Subsequent) preventive examination.  Review of Systems:  No ROS.  Medicare Wellness Visit. Cardiac Risk Factors include: advanced age (>62men, >12 women);diabetes mellitus;dyslipidemia;obesity (BMI >30kg/m2);sedentary lifestyle;hypertension Sleep patterns: Sleeps well about 8-9 hrs per night. Feels rested. Home Safety/Smoke Alarms:  Feels safe in home. Smoke alarms in place.  Living environment; residence and Firearm Safety: Lives at home with husband in 2 story home. No guns. Seat Belt Safety/Bike Helmet: Wears seat belt.   Counseling:   Eye Exam- Visits eye doctor annually.Can't recall name. Dental- Visits dentist Dr.Tanner every 6 months.  Female:   Pap-hysterectomy.       Mammo-  Last 01/12/16: BI-RADS CATEGORY  1: Negative.     Dexa scan- Last 01/02/14: Normal       CCS- (per external report) Last 03/06/02-normal     Objective:     Vitals: BP 138/72 (BP Location: Right Arm, Patient Position: Sitting, Cuff Size: Large)   Pulse 83   Ht 5\' 5"  (1.651 m)   Wt 226 lb (102.5 kg)   SpO2 98%   BMI 37.61 kg/m   Body mass index is 37.61 kg/m.   Tobacco History  Smoking Status  . Never Smoker  Smokeless Tobacco  . Never Used     Counseling given: No   Past Medical History:  Diagnosis Date  . Allergy    Dust Mites  . Ankle pain    chronic  . Diabetes mellitus, type 2 ([place])   . Hyperlipidemia   . Hypertension   . Osteoarthritis   . RENAL INSUFFICIENCY, CHRONIC 04/16/2010  . Rhinitis    allergic nos   Past Surgical History:  Procedure Laterality Date  . CATARACT EXTRACTION    . CHOLECYSTECTOMY    . dental implants    . PARTIAL HYSTERECTOMY     in her 41s   Family History  Problem Relation Age of Onset  . Healthy Brother   . Healthy Son   . Healthy Son   . Heart attack Neg Hx   . Colon cancer Neg Hx   . Breast cancer Neg Hx   . Stroke Neg Hx    History  Sexual  Activity  . Sexual activity: Yes  . Partners: Male    Outpatient Encounter Prescriptions as of 03/05/2016  Medication Sig  . amLODipine (NORVASC) 5 MG tablet Take 1 tablet (5 mg total) by mouth daily.  Marland Kitchen aspirin 81 MG tablet Take 81 mg by mouth daily.    Marland Kitchen atorvastatin (LIPITOR) 10 MG tablet Take 1 tablet (10 mg total) by mouth daily.  Marland Kitchen azelastine (ASTELIN) 0.1 % nasal spray Place 2 sprays into both nostrils at bedtime as needed for rhinitis. Use in each nostril as directed  . cromolyn (NASALCROM) 5.2 MG/ACT nasal spray Place 1 spray into both nostrils 4 (four) times daily.  . Cyanocobalamin (B-12 PO) Take 1 tablet by mouth daily.   Marland Kitchen EPINEPHrine 0.3 mg/0.3 mL IJ SOAJ injection Inject 0.3 mg into the muscle once. Reported on 06/30/2015  . fluticasone (FLONASE) 50 MCG/ACT nasal spray USE 1 TO 2 SPRAYS IN EACH NOSTRIL DAILY  . fluticasone (FLOVENT HFA) 44 MCG/ACT inhaler Inhale 2 puffs into the lungs 2 (two) times daily.  . furosemide (LASIX) 20 MG tablet Take 2 tablets (40 mg total) by mouth daily.  . Insulin Lispro Prot & Lispro (HUMALOG MIX 75/25 KWIKPEN) (75-25) 100 UNIT/ML Kwikpen Inject 40 Units into  the skin daily with breakfast.  . loratadine (CLARITIN) 10 MG tablet Take 10 mg by mouth daily.  Marland Kitchen losartan (COZAAR) 50 MG tablet Take 1 tablet (50 mg total) by mouth daily.  . Multiple Vitamins-Minerals (CENTRUM SILVER PO) Take 1 tablet by mouth daily.    Marland Kitchen omeprazole (PRILOSEC) 20 MG capsule Take 1 capsule (20 mg total) by mouth daily.  Vladimir Faster Glycol-Propyl Glycol (SYSTANE) 0.4-0.3 % SOLN Apply 1 drop to eye daily as needed (for dry eyes).  . SURE COMFORT PEN NEEDLES 31G X 5 MM MISC Use to inject insulin 1 time per day.  Marland Kitchen TANZEUM 30 MG PEN INJECT 30 MG UNDER THE SKIN ONCE A WEEK   No facility-administered encounter medications on file as of 03/05/2016.     Activities of Daily Living In your present state of health, do you have any difficulty performing the following activities:  03/05/2016  Hearing? N  Vision? N  Difficulty concentrating or making decisions? N  Walking or climbing stairs? N  Dressing or bathing? N  Doing errands, shopping? N  Preparing Food and eating ? N  Using the Toilet? N  In the past six months, have you accidently leaked urine? N  Do you have problems with loss of bowel control? N  Managing your Medications? N  Managing your Finances? N  Housekeeping or managing your Housekeeping? N  Some recent data might be hidden    Patient Care Team: Colon Branch, MD as PCP - General Renato Shin, MD as Consulting Physician (Endocrinology) Donato Heinz, MD as Consulting Physician (Nephrology) Tiajuana Amass, MD as Referring Physician (Allergy and Immunology) Juanita Craver, MD as Consulting Physician (Gastroenterology)    Assessment:     Physical assessment deferred to PCP.  Exercise Activities and Dietary recommendations Exercise limited by: None identified Diet (meal preparation, eat out, water intake, caffeinated beverages, dairy products, fruits and vegetables): in general, a "healthy" diet  , well balanced       Goals      Patient Stated   . Lose 20 lbs by next year.   (pt-stated)      Other   . Increase physical activity          Walking- walking the [place] and Treadmill      Fall Risk Fall Risk  03/05/2016 12/19/2014 12/18/2013 10/17/2013 12/15/2012  Falls in the past year? No No No No Yes  Number falls in past yr: - - - - 1  Injury with Fall? - - - - Yes   Depression Screen PHQ 2/9 Scores 03/05/2016 12/19/2014 12/18/2013 10/17/2013  PHQ - 2 Score 0 0 0 0     Cognitive Function MMSE - Mini Mental State Exam 03/05/2016 12/19/2014  Orientation to time 5 5  Orientation to Place 5 5  Registration 3 2  Attention/ Calculation 5 5  Recall 2 1  Language- name 2 objects 2 2  Language- repeat 1 1  Language- follow 3 step command 3 3  Language- read & follow direction 1 1  Write a sentence 1 1  Copy design 1 1  Total score 29 27         Immunization History  Administered Date(s) Administered  . Influenza Split 12/02/2011, 12/21/2013  . Influenza Whole 12/21/2006, 11/28/2007, 11/22/2008, 11/23/2010  . Influenza, High Dose Seasonal PF 11/29/2012, 12/02/2015  . Influenza-Unspecified 12/04/2010, 12/18/2013, 12/05/2014  . Pneumococcal Conjugate-13 12/18/2013  . Pneumococcal Polysaccharide-23 11/14/2007  . Td 11/14/2007   Screening Tests Health Maintenance  Topic Date Due  . OPHTHALMOLOGY EXAM  03/09/2015  . HEMOGLOBIN A1C  04/21/2016  . FOOT EXAM  10/21/2016  . TETANUS/TDAP  11/13/2017  . MAMMOGRAM  01/11/2018  . INFLUENZA VACCINE  Completed  . DEXA SCAN  Completed  . ZOSTAVAX  Addressed  . PNA vac Low Risk Adult  Completed      Plan:   Continue to eat heart healthy diet (full of fruits, vegetables, whole grains, lean protein, water--limit salt, fat, and sugar intake) and increase physical activity as tolerated.  Continue doing brain stimulating activities (puzzles, reading, adult coloring books, staying active) to keep memory sharp.    During the course of the visit the patient was educated and counseled about the following appropriate screening and preventive services:   Vaccines to include Pneumoccal, Influenza, Hepatitis B, Td, Zostavax, HCV  Cardiovascular Disease Pt verbalizes compliance with meds.  Colorectal cancer screening: Would like to discuss if still needed.  Bone density screening: Pt would like to do 12/2016 with her next mammogram.  Diabetes screening: Pt verbalizes compliance with meds.  Glaucoma screening: Follows with eye doctor.  Mammography/PAP: Mammogram UTD  Nutrition counseling   Patient Instructions (the written plan) was given to the patient.   Naaman Plummer [place], [place]  03/05/2016  Kathlene November, MD

## 2016-03-05 ENCOUNTER — Ambulatory Visit: Payer: Medicare Other | Admitting: *Deleted

## 2016-03-05 ENCOUNTER — Encounter: Payer: Self-pay | Admitting: Internal Medicine

## 2016-03-05 ENCOUNTER — Ambulatory Visit (INDEPENDENT_AMBULATORY_CARE_PROVIDER_SITE_OTHER): Payer: Medicare Other | Admitting: Internal Medicine

## 2016-03-05 VITALS — BP 138/72 | HR 83 | Ht 65.0 in | Wt 226.0 lb

## 2016-03-05 DIAGNOSIS — Z0000 Encounter for general adult medical examination without abnormal findings: Secondary | ICD-10-CM

## 2016-03-05 DIAGNOSIS — E785 Hyperlipidemia, unspecified: Secondary | ICD-10-CM

## 2016-03-05 DIAGNOSIS — E118 Type 2 diabetes mellitus with unspecified complications: Secondary | ICD-10-CM

## 2016-03-05 NOTE — Patient Instructions (Addendum)
Continue to eat heart healthy diet (full of fruits, vegetables, whole grains, lean protein, water--limit salt, fat, and sugar intake) and increase physical activity as tolerated.  Continue doing brain stimulating activities (puzzles, reading, adult coloring books, staying active) to keep memory sharp.   =============    GO TO THE FRONT DESK Schedule labs to be done next week, fasting  Schedule your next appointment for a  routine checkup in 6-7 months

## 2016-03-05 NOTE — Progress Notes (Signed)
Subjective:    Patient ID: Angel Green, female    DOB: April 18, 1940, 76 y.o.   MRN: [id number]  DOS:  03/05/2016 Type of visit - description : Routine office visit Interval history: Had Medicare wellness today DM: Note from endocrinology reviewed, started Tanzeum 06/2015, has lost 5-6 pounds. Renal insufficiency: Note from nephrology reviewed Allergies: Good compliance w/ shots, symptoms controlled High cholesterol: On Lipitor, no side effects that she can tell  Wt Readings from Last 3 Encounters:  03/05/16 226 lb (102.5 kg)  10/22/15 226 lb (102.5 kg)  07/22/15 231 lb (104.8 kg)     Review of Systems Denies chest pain or difficulty breathing No nausea, vomiting, diarrhea No anxiety or depression  Past Medical History:  Diagnosis Date  . Allergy    Dust Mites  . Ankle pain    chronic  . Diabetes mellitus, type 2 ([place])   . Hyperlipidemia   . Hypertension   . Osteoarthritis   . RENAL INSUFFICIENCY, CHRONIC 04/16/2010  . Rhinitis    allergic nos    Past Surgical History:  Procedure Laterality Date  . CATARACT EXTRACTION    . CHOLECYSTECTOMY    . dental implants    . PARTIAL HYSTERECTOMY     in her 76s    Social History   Social History  . Marital status: Married    Spouse name: N/A  . Number of children: 2  . Years of education: N/A   Occupational History  .  retired    Social History Main Topics  . Smoking status: Never Smoker  . Smokeless tobacco: Never Used  . Alcohol use No  . Drug use: No  . Sexual activity: Yes    Partners: Male   Other Topics Concern  . Not on file   Social History Narrative    husband is a Company secretary                  Allergies as of 03/05/2016   No Known Allergies     Medication List       Accurate as of 03/05/16 11:59 PM. Always use your most recent med list.          amLODipine 5 MG tablet Commonly known as:  NORVASC Take 1 tablet (5 mg total) by mouth daily.   aspirin 81 MG tablet Take 81 mg by mouth  daily.   atorvastatin 10 MG tablet Commonly known as:  LIPITOR Take 1 tablet (10 mg total) by mouth daily.   azelastine 0.1 % nasal spray Commonly known as:  ASTELIN Place 2 sprays into both nostrils at bedtime as needed for rhinitis. Use in each nostril as directed   B-12 PO Take 1 tablet by mouth daily.   CENTRUM SILVER PO Take 1 tablet by mouth daily.   cromolyn 5.2 MG/ACT nasal spray Commonly known as:  NASALCROM Place 1 spray into both nostrils 4 (four) times daily.   EPINEPHrine 0.3 mg/0.3 mL Soaj injection Commonly known as:  EPI-PEN Inject 0.3 mg into the muscle once. Reported on 06/30/2015   fluticasone 44 MCG/ACT inhaler Commonly known as:  FLOVENT HFA Inhale 2 puffs into the lungs 2 (two) times daily.   fluticasone 50 MCG/ACT nasal spray Commonly known as:  FLONASE USE 1 TO 2 SPRAYS IN EACH NOSTRIL DAILY   furosemide 20 MG tablet Commonly known as:  LASIX Take 2 tablets (40 mg total) by mouth daily.   Insulin Lispro Prot & Lispro (75-25) 100 UNIT/ML Kwikpen Commonly  known as:  HUMALOG MIX 75/25 KWIKPEN Inject 40 Units into the skin daily with breakfast.   loratadine 10 MG tablet Commonly known as:  CLARITIN Take 10 mg by mouth daily.   losartan 50 MG tablet Commonly known as:  COZAAR Take 1 tablet (50 mg total) by mouth daily.   omeprazole 20 MG capsule Commonly known as:  PRILOSEC Take 1 capsule (20 mg total) by mouth daily.   SURE COMFORT PEN NEEDLES 31G X 5 MM Misc Generic drug:  Insulin Pen Needle Use to inject insulin 1 time per day.   SYSTANE 0.4-0.3 % Soln Generic drug:  Polyethyl Glycol-Propyl Glycol Apply 1 drop to eye daily as needed (for dry eyes).   TANZEUM 30 MG Pen Generic drug:  Albiglutide INJECT 30 MG UNDER THE SKIN ONCE A WEEK          Objective:   Physical Exam BP 138/72 (BP Location: Right Arm, Patient Position: Sitting, Cuff Size: Large)   Pulse 83   Ht 5\' 5"  (1.651 m)   Wt 226 lb (102.5 kg)   SpO2 98%   BMI  37.61 kg/m  General:   Well developed, well nourished . NAD.  HEENT:  Normocephalic . Face symmetric, atraumatic Neck: No thyromegaly Lungs:  CTA B Normal respiratory effort, no intercostal retractions, no accessory muscle use. Heart: RRR,  no murmur.  no pretibial edema bilaterally  Abdomen:  Not distended, soft, non-tender. No rebound or rigidity.  Skin: Not pale. Not jaundice Neurologic:  alert & oriented X3.  Speech normal, gait appropriate for age and unassisted Psych--  Cognition and judgment appear intact.  Cooperative with normal attention span and concentration.  Behavior appropriate. No anxious or depressed appearing.    Assessment & Plan:   Assessment>  DM  Dr Loanne Drilling + Neuropathy: Per foot exam 12-2014 HTN ---change ACE to ARB is 03/2014 Hyperlipidemia CRI  dx 2012  Sees Dr Arty Baumgartner  Morbid obesity DJD, had  chronic ankle pain Allergies -- dust mites , occ uses a inhaler , sees allergist, has shots q weeks started ~ 08-2014  PLAN: DM: Per Dr. Loanne Drilling. Check a TSH HTN: Seems well-controlled on losartan, Lasix, amlodipine. Last BMP 11-2015 satisfactory. Hyperlipidemia: On Lipitor, LFTs 11-2015 normal, check a FLP CRI: Last seen by nephrology-2017. Seems stable Morbid obesity: Started Tanzeum 06-2015,  lost few pounds. RTC 6 - 7 months

## 2016-03-05 NOTE — Assessment & Plan Note (Addendum)
Had a Medicare wellness today Had a mammogram 12-2015 Patient asked about another colonoscopy (Cscope 02-2002 and 10-2012, bx tubular adenoma) will call Dr. Collene Mares office to see if further cscopes indicated

## 2016-03-07 NOTE — Assessment & Plan Note (Signed)
DM: Per Dr. Loanne Drilling. Check a TSH HTN: Seems well-controlled on losartan, Lasix, amlodipine. Last BMP 11-2015 satisfactory. Hyperlipidemia: On Lipitor, LFTs 11-2015 normal, check a FLP CRI: Last seen by nephrology-2017. Seems stable Morbid obesity: Started Tanzeum 06-2015,  lost few pounds. RTC 6 - 7 months

## 2016-03-09 ENCOUNTER — Other Ambulatory Visit (INDEPENDENT_AMBULATORY_CARE_PROVIDER_SITE_OTHER): Payer: Medicare Other

## 2016-03-09 DIAGNOSIS — E785 Hyperlipidemia, unspecified: Secondary | ICD-10-CM

## 2016-03-09 DIAGNOSIS — J3089 Other allergic rhinitis: Secondary | ICD-10-CM | POA: Diagnosis not present

## 2016-03-09 DIAGNOSIS — E118 Type 2 diabetes mellitus with unspecified complications: Secondary | ICD-10-CM

## 2016-03-09 LAB — LIPID PANEL
CHOL/HDL RATIO: 3
Cholesterol: 151 mg/dL (ref 0–200)
HDL: 57.3 mg/dL (ref >39.00)
LDL CALC: 75 mg/dL (ref 0–99)
NONHDL: 93.82
Triglycerides: 94 mg/dL (ref 0.0–149.0)
VLDL: 18.8 mg/dL (ref 0.0–40.0)

## 2016-03-09 LAB — TSH: TSH: 3.11 u[IU]/mL (ref 0.35–4.50)

## 2016-03-22 DIAGNOSIS — J3089 Other allergic rhinitis: Secondary | ICD-10-CM | POA: Diagnosis not present

## 2016-03-23 ENCOUNTER — Encounter: Payer: Self-pay | Admitting: Endocrinology

## 2016-03-23 ENCOUNTER — Ambulatory Visit (INDEPENDENT_AMBULATORY_CARE_PROVIDER_SITE_OTHER): Payer: Medicare Other | Admitting: Endocrinology

## 2016-03-23 VITALS — BP 122/86 | HR 91 | Ht 65.0 in | Wt 226.0 lb

## 2016-03-23 DIAGNOSIS — E118 Type 2 diabetes mellitus with unspecified complications: Secondary | ICD-10-CM

## 2016-03-23 DIAGNOSIS — J3089 Other allergic rhinitis: Secondary | ICD-10-CM | POA: Diagnosis not present

## 2016-03-23 LAB — POCT GLYCOSYLATED HEMOGLOBIN (HGB A1C): HEMOGLOBIN A1C: 8.4

## 2016-03-23 MED ORDER — INSULIN LISPRO PROT & LISPRO (75-25 MIX) 100 UNIT/ML KWIKPEN: 45.0000 [IU] | PEN_INJECTOR | Freq: Every day | SUBCUTANEOUS | 5 refills | Status: DC

## 2016-03-23 NOTE — Patient Instructions (Addendum)
Please increase the insulin to 45 units each morning, and:  Please continue the same tanzeum.  This will go off the market in a few months.  Call when this happens, so I can prescribe for you an alternative.  check your blood sugar twice a day.  vary the time of day when you check, between before the 3 meals, and at bedtime.  also check if you have symptoms of your blood sugar being too high or too low.  please keep a record of the readings and bring it to your next appointment here (or you can bring the meter itself).  You can write it on any piece of paper.  please call us sooner if your blood sugar goes below 70, or if you have a lot of readings over 200.  Please come back for a follow-up appointment in 3 months.

## 2016-03-23 NOTE — Progress Notes (Signed)
Subjective:    Patient ID: Angel Green, female    DOB: 07-Dec-1940, 76 y.o.   MRN: [id number]  HPI Pt returns for f/u of diabetes mellitus: DM type: Insulin-requiring type 2 Dx'ed: 6712 Complications: neuropathy of the lower extremities, and renal insufficiency.  Therapy: insulin since 2010, and tanzeum.  GDM: never. DKA: never Severe hypoglycemia: never.  Pancreatitis: never.  Other: therapy has been limited by variable cbg's; she changed to QAM premixed insulin, after poor results with multiple daily injections; pattern of cbg's indicates she does not need a PM dose.   Interval history:  no cbg record, but states cbg's are well-controlled.  It is in general higher as the day goes on.  pt states she feels well in general.  She has lost a few lbs.   Past Medical History:  Diagnosis Date  . Allergy    Dust Mites  . Ankle pain    chronic  . Diabetes mellitus, type 2 ([place])   . Hyperlipidemia   . Hypertension   . Osteoarthritis   . RENAL INSUFFICIENCY, CHRONIC 04/16/2010  . Rhinitis    allergic nos    Past Surgical History:  Procedure Laterality Date  . CATARACT EXTRACTION    . CHOLECYSTECTOMY    . dental implants    . PARTIAL HYSTERECTOMY     in her 99s    Social History   Social History  . Marital status: Married    Spouse name: N/A  . Number of children: 2  . Years of education: N/A   Occupational History  .  retired    Social History Main Topics  . Smoking status: Never Smoker  . Smokeless tobacco: Never Used  . Alcohol use No  . Drug use: No  . Sexual activity: Yes    Partners: Male   Other Topics Concern  . Not on file   Social History Narrative    husband is a Company secretary                Current Outpatient Prescriptions on File Prior to Visit  Medication Sig Dispense Refill  . amLODipine (NORVASC) 5 MG tablet Take 1 tablet (5 mg total) by mouth daily. 90 tablet 2  . aspirin 81 MG tablet Take 81 mg by mouth daily.      Marland Kitchen atorvastatin  (LIPITOR) 10 MG tablet Take 1 tablet (10 mg total) by mouth daily. 90 tablet 2  . azelastine (ASTELIN) 0.1 % nasal spray Place 2 sprays into both nostrils at bedtime as needed for rhinitis. Use in each nostril as directed 30 mL 3  . cromolyn (NASALCROM) 5.2 MG/ACT nasal spray Place 1 spray into both nostrils 4 (four) times daily. 26 mL 12  . Cyanocobalamin (B-12 PO) Take 1 tablet by mouth daily.     Marland Kitchen EPINEPHrine 0.3 mg/0.3 mL IJ SOAJ injection Inject 0.3 mg into the muscle once. Reported on 06/30/2015    . fluticasone (FLONASE) 50 MCG/ACT nasal spray USE 1 TO 2 SPRAYS IN EACH NOSTRIL DAILY 16 g 3  . fluticasone (FLOVENT HFA) 44 MCG/ACT inhaler Inhale 2 puffs into the lungs 2 (two) times daily. 3 Inhaler 3  . furosemide (LASIX) 20 MG tablet Take 2 tablets (40 mg total) by mouth daily. 180 tablet 2  . loratadine (CLARITIN) 10 MG tablet Take 10 mg by mouth daily.    Marland Kitchen losartan (COZAAR) 50 MG tablet Take 1 tablet (50 mg total) by mouth daily. 90 tablet 2  . Multiple Vitamins-Minerals (CENTRUM  SILVER PO) Take 1 tablet by mouth daily.      Marland Kitchen omeprazole (PRILOSEC) 20 MG capsule Take 1 capsule (20 mg total) by mouth daily. 90 capsule 2  . Polyethyl Glycol-Propyl Glycol (SYSTANE) 0.4-0.3 % SOLN Apply 1 drop to eye daily as needed (for dry eyes).    . SURE COMFORT PEN NEEDLES 31G X 5 MM MISC Use to inject insulin 1 time per day. 100 each 2  . TANZEUM 30 MG PEN INJECT 30 MG UNDER THE SKIN ONCE A WEEK 12 each 0   No current facility-administered medications on file prior to visit.     No Known Allergies  Family History  Problem Relation Age of Onset  . Healthy Brother   . Healthy Son   . Healthy Son   . Heart attack Neg Hx   . Colon cancer Neg Hx   . Breast cancer Neg Hx   . Stroke Neg Hx     BP 122/86   Pulse 91   Ht 5\' 5"  (1.651 m)   Wt 226 lb (102.5 kg)   SpO2 98%   BMI 37.61 kg/m    Review of Systems She denies hypoglycemia    Objective:   Physical Exam VITAL SIGNS:  See vs  page GENERAL: no distress Pulses: dorsalis pedis intact bilat.   MSK: no deformity of the feet CV: no leg edema Skin:  no ulcer on the feet.  normal color and temp on the feet. Neuro: sensation is intact to touch on the feet.    A1c=8.4%    Assessment & Plan:  Insulin-requiring type 2 DM, with polyneuropathy: worse Renal insuff: this is the likely reason why she does not need a PM insulin dose.    Patient is advised the following: Patient Instructions  Please increase the insulin to 45 units each morning, and:  Please continue the same tanzeum.  This will go off the market in a few months.  Call when this happens, so I can prescribe for you an alternative.  check your blood sugar twice a day.  vary the time of day when you check, between before the 3 meals, and at bedtime.  also check if you have symptoms of your blood sugar being too high or too low.  please keep a record of the readings and bring it to your next appointment here (or you can bring the meter itself).  You can write it on any piece of paper.  please call us sooner if your blood sugar goes below 70, or if you have a lot of readings over 200.  Please come back for a follow-up appointment in 3 months.

## 2016-03-29 DIAGNOSIS — J3089 Other allergic rhinitis: Secondary | ICD-10-CM | POA: Diagnosis not present

## 2016-04-05 DIAGNOSIS — J3089 Other allergic rhinitis: Secondary | ICD-10-CM | POA: Diagnosis not present

## 2016-04-05 DIAGNOSIS — J3081 Allergic rhinitis due to animal (cat) (dog) hair and dander: Secondary | ICD-10-CM | POA: Diagnosis not present

## 2016-04-16 DIAGNOSIS — J3089 Other allergic rhinitis: Secondary | ICD-10-CM | POA: Diagnosis not present

## 2016-04-21 DIAGNOSIS — J3089 Other allergic rhinitis: Secondary | ICD-10-CM | POA: Diagnosis not present

## 2016-04-26 ENCOUNTER — Other Ambulatory Visit: Payer: Self-pay

## 2016-04-26 MED ORDER — OMEPRAZOLE 20 MG PO CPDR: 20.0000 mg | DELAYED_RELEASE_CAPSULE | Freq: Every day | ORAL | 2 refills | Status: DC

## 2016-04-26 MED ORDER — LOSARTAN POTASSIUM 50 MG PO TABS: 50.0000 mg | ORAL_TABLET | Freq: Every day | ORAL | 2 refills | Status: DC

## 2016-04-26 MED ORDER — LORATADINE 10 MG PO TABS: 10.0000 mg | ORAL_TABLET | Freq: Every day | ORAL | 2 refills | Status: DC

## 2016-04-26 MED ORDER — ATORVASTATIN CALCIUM 10 MG PO TABS: 10.0000 mg | ORAL_TABLET | Freq: Every day | ORAL | 2 refills | Status: DC

## 2016-04-26 MED ORDER — FUROSEMIDE 20 MG PO TABS: 40.0000 mg | ORAL_TABLET | Freq: Every day | ORAL | 2 refills | Status: DC

## 2016-04-27 DIAGNOSIS — J3081 Allergic rhinitis due to animal (cat) (dog) hair and dander: Secondary | ICD-10-CM | POA: Diagnosis not present

## 2016-04-27 DIAGNOSIS — J3089 Other allergic rhinitis: Secondary | ICD-10-CM | POA: Diagnosis not present

## 2016-05-04 DIAGNOSIS — J3089 Other allergic rhinitis: Secondary | ICD-10-CM | POA: Diagnosis not present

## 2016-05-18 DIAGNOSIS — J3089 Other allergic rhinitis: Secondary | ICD-10-CM | POA: Diagnosis not present

## 2016-05-25 DIAGNOSIS — J3089 Other allergic rhinitis: Secondary | ICD-10-CM | POA: Diagnosis not present

## 2016-05-31 DIAGNOSIS — J3089 Other allergic rhinitis: Secondary | ICD-10-CM | POA: Diagnosis not present

## 2016-06-08 DIAGNOSIS — J3081 Allergic rhinitis due to animal (cat) (dog) hair and dander: Secondary | ICD-10-CM | POA: Diagnosis not present

## 2016-06-08 DIAGNOSIS — J3089 Other allergic rhinitis: Secondary | ICD-10-CM | POA: Diagnosis not present

## 2016-06-15 DIAGNOSIS — J3089 Other allergic rhinitis: Secondary | ICD-10-CM | POA: Diagnosis not present

## 2016-06-15 DIAGNOSIS — J3081 Allergic rhinitis due to animal (cat) (dog) hair and dander: Secondary | ICD-10-CM | POA: Diagnosis not present

## 2016-06-18 ENCOUNTER — Encounter: Payer: Self-pay | Admitting: Endocrinology

## 2016-06-18 ENCOUNTER — Ambulatory Visit (INDEPENDENT_AMBULATORY_CARE_PROVIDER_SITE_OTHER): Payer: Medicare Other | Admitting: Endocrinology

## 2016-06-18 VITALS — BP 134/86 | HR 98 | Ht 65.0 in | Wt 222.0 lb

## 2016-06-18 DIAGNOSIS — E118 Type 2 diabetes mellitus with unspecified complications: Secondary | ICD-10-CM | POA: Diagnosis not present

## 2016-06-18 LAB — POCT GLYCOSYLATED HEMOGLOBIN (HGB A1C): Hemoglobin A1C: 9.1

## 2016-06-18 MED ORDER — INSULIN LISPRO PROT & LISPRO (75-25 MIX) 100 UNIT/ML KWIKPEN: 55.0000 [IU] | PEN_INJECTOR | Freq: Every day | SUBCUTANEOUS | 5 refills | Status: DC

## 2016-06-18 NOTE — Progress Notes (Signed)
Subjective:    Patient ID: Angel Green, female    DOB: 08-06-1940, 76 y.o.   MRN: [id number]  HPI Pt returns for f/u of diabetes mellitus: DM type: Insulin-requiring type 2 Dx'ed: 8182 Complications: neuropathy of the lower extremities, and renal insufficiency.  Therapy: insulin since 2010, and tanzeum.  GDM: never. DKA: never Severe hypoglycemia: never.  Pancreatitis: never.  Other: therapy has been limited by variable cbg's; she changed to QAM premixed insulin, after poor results with multiple daily injections; pattern of cbg's indicates she does not need a PM dose.   Interval history:  no cbg record, but states cbg's are 100-150.  There is no trend throughout the day.   Past Medical History:  Diagnosis Date  . Allergy    Dust Mites  . Ankle pain    chronic  . Diabetes mellitus, type 2 ([place])   . Hyperlipidemia   . Hypertension   . Osteoarthritis   . RENAL INSUFFICIENCY, CHRONIC 04/16/2010  . Rhinitis    allergic nos    Past Surgical History:  Procedure Laterality Date  . CATARACT EXTRACTION    . CHOLECYSTECTOMY    . dental implants    . PARTIAL HYSTERECTOMY     in her 47s    Social History   Social History  . Marital status: Married    Spouse name: N/A  . Number of children: 2  . Years of education: N/A   Occupational History  .  retired    Social History Main Topics  . Smoking status: Never Smoker  . Smokeless tobacco: Never Used  . Alcohol use No  . Drug use: No  . Sexual activity: Yes    Partners: Male   Other Topics Concern  . Not on file   Social History Narrative    husband is a Company secretary                Current Outpatient Prescriptions on File Prior to Visit  Medication Sig Dispense Refill  . amLODipine (NORVASC) 5 MG tablet Take 1 tablet (5 mg total) by mouth daily. 90 tablet 2  . aspirin 81 MG tablet Take 81 mg by mouth daily.      Marland Kitchen atorvastatin (LIPITOR) 10 MG tablet Take 1 tablet (10 mg total) by mouth daily. 90 tablet 2  .  azelastine (ASTELIN) 0.1 % nasal spray Place 2 sprays into both nostrils at bedtime as needed for rhinitis. Use in each nostril as directed 30 mL 3  . cromolyn (NASALCROM) 5.2 MG/ACT nasal spray Place 1 spray into both nostrils 4 (four) times daily. 26 mL 12  . Cyanocobalamin (B-12 PO) Take 1 tablet by mouth daily.     Marland Kitchen EPINEPHrine 0.3 mg/0.3 mL IJ SOAJ injection Inject 0.3 mg into the muscle once. Reported on 06/30/2015    . fluticasone (FLONASE) 50 MCG/ACT nasal spray USE 1 TO 2 SPRAYS IN EACH NOSTRIL DAILY 16 g 3  . fluticasone (FLOVENT HFA) 44 MCG/ACT inhaler Inhale 2 puffs into the lungs 2 (two) times daily. 3 Inhaler 3  . furosemide (LASIX) 20 MG tablet Take 2 tablets (40 mg total) by mouth daily. 180 tablet 2  . loratadine (CLARITIN) 10 MG tablet Take 1 tablet (10 mg total) by mouth daily. 90 tablet 2  . losartan (COZAAR) 50 MG tablet Take 1 tablet (50 mg total) by mouth daily. 90 tablet 2  . Multiple Vitamins-Minerals (CENTRUM SILVER PO) Take 1 tablet by mouth daily.      Marland Kitchen  omeprazole (PRILOSEC) 20 MG capsule Take 1 capsule (20 mg total) by mouth daily. 90 capsule 2  . Polyethyl Glycol-Propyl Glycol (SYSTANE) 0.4-0.3 % SOLN Apply 1 drop to eye daily as needed (for dry eyes).    . SURE COMFORT PEN NEEDLES 31G X 5 MM MISC Use to inject insulin 1 time per day. 100 each 2  . TANZEUM 30 MG PEN INJECT 30 MG UNDER THE SKIN ONCE A WEEK 12 each 0   No current facility-administered medications on file prior to visit.     No Known Allergies  Family History  Problem Relation Age of Onset  . Healthy Brother   . Healthy Son   . Healthy Son   . Heart attack Neg Hx   . Colon cancer Neg Hx   . Breast cancer Neg Hx   . Stroke Neg Hx     BP 134/86   Pulse 98   Ht 5\' 5"  (1.651 m)   Wt 222 lb (100.7 kg)   SpO2 97%   BMI 36.94 kg/m   Review of Systems She denies hypoglycemia    Objective:   Physical Exam VITAL SIGNS:  See vs page GENERAL: no distress Pulses: dorsalis pedis intact  bilat.   MSK: no deformity of the feet.   CV: no leg edema.  Skin:  no ulcer on the feet.  normal color and temp on the feet.  Neuro: sensation is intact to touch on the feet.    a1c=9.1%    Assessment & Plan:  Insulin-requiring type 2 DM, with renal insufficiency: worse  Patient Instructions  Please increase the insulin to 55 units each morning, and:  Please continue the same tanzeum.  This will go off the market in a few months.  Call when this happens, so I can prescribe for you an alternative.  check your blood sugar twice a day.  vary the time of day when you check, between before the 3 meals, and at bedtime.  also check if you have symptoms of your blood sugar being too high or too low.  please keep a record of the readings and bring it to your next appointment here (or you can bring the meter itself).  You can write it on any piece of paper.  please call us sooner if your blood sugar goes below 70, or if you have a lot of readings over 200.  Please come back for a follow-up appointment in 3 months.

## 2016-06-18 NOTE — Patient Instructions (Addendum)
Please increase the insulin to 55 units each morning, and:  Please continue the same tanzeum.  This will go off the market in a few months.  Call when this happens, so I can prescribe for you an alternative.  check your blood sugar twice a day.  vary the time of day when you check, between before the 3 meals, and at bedtime.  also check if you have symptoms of your blood sugar being too high or too low.  please keep a record of the readings and bring it to your next appointment here (or you can bring the meter itself).  You can write it on any piece of paper.  please call us sooner if your blood sugar goes below 70, or if you have a lot of readings over 200.  Please come back for a follow-up appointment in 3 months.

## 2016-06-22 DIAGNOSIS — J3081 Allergic rhinitis due to animal (cat) (dog) hair and dander: Secondary | ICD-10-CM | POA: Diagnosis not present

## 2016-06-22 DIAGNOSIS — J3089 Other allergic rhinitis: Secondary | ICD-10-CM | POA: Diagnosis not present

## 2016-06-29 DIAGNOSIS — J3089 Other allergic rhinitis: Secondary | ICD-10-CM | POA: Diagnosis not present

## 2016-07-07 DIAGNOSIS — R05 Cough: Secondary | ICD-10-CM | POA: Diagnosis not present

## 2016-07-07 DIAGNOSIS — K219 Gastro-esophageal reflux disease without esophagitis: Secondary | ICD-10-CM | POA: Diagnosis not present

## 2016-07-07 DIAGNOSIS — J3089 Other allergic rhinitis: Secondary | ICD-10-CM | POA: Diagnosis not present

## 2016-07-07 DIAGNOSIS — J3081 Allergic rhinitis due to animal (cat) (dog) hair and dander: Secondary | ICD-10-CM | POA: Diagnosis not present

## 2016-07-21 DIAGNOSIS — J3089 Other allergic rhinitis: Secondary | ICD-10-CM | POA: Diagnosis not present

## 2016-07-26 ENCOUNTER — Telehealth: Payer: Self-pay | Admitting: Internal Medicine

## 2016-07-26 MED ORDER — DULAGLUTIDE 0.75 MG/0.5ML ~~LOC~~ SOAJ: 0.7500 mg | SUBCUTANEOUS | 11 refills | Status: DC

## 2016-07-26 NOTE — Telephone Encounter (Signed)
Patient was told the TANZEUM 30 MG PEN will not be available soon. She has 3 shots left.  She would like a script for Trulicity.  Uses: [place], [place]  Please call to discuss.  Thank you,  -LL

## 2016-07-26 NOTE — Telephone Encounter (Signed)
Okay to change to this? Please advise. Thank you!

## 2016-07-26 NOTE — Telephone Encounter (Signed)
I have sent a prescription to your pharmacy.  This is the lower dosage.  If you don't have nausea on this, let us know, so we can increase to the higher amount.

## 2016-07-29 ENCOUNTER — Other Ambulatory Visit: Payer: Self-pay

## 2016-07-29 ENCOUNTER — Telehealth: Payer: Self-pay | Admitting: Endocrinology

## 2016-07-29 NOTE — Telephone Encounter (Signed)
**  Remind patient they can make refill requests via MyChart**  Medication refill request (Name & Dosage):   Dulaglutide (TRULICITY) 5.99 JT/7.0VX SOPN 90 day supply  Preferred pharmacy (Name & Address):  [facility] - [address], [place] [phone] (Phone) [phone] (Fax)   Other comments (if applicable):   [facility] called to advise that the patient is requesting a 90 day supply of  Dulaglutide (TRULICITY) 3.35 KT/6.2BW SOPN.   Please call [phone] to advise if this is approved by the provider.   Reference #: [id number]

## 2016-07-29 NOTE — Telephone Encounter (Signed)
[facility], advised to submit refill. No other questions.

## 2016-08-03 DIAGNOSIS — J3089 Other allergic rhinitis: Secondary | ICD-10-CM | POA: Diagnosis not present

## 2016-08-11 NOTE — Telephone Encounter (Addendum)
Patient called in and requested [place] to do the PA for the trulicity, but if the patient has not tried and failed bydureon the PA will not be approved. Can we send a prescription for bydureon?

## 2016-08-11 NOTE — Telephone Encounter (Signed)
Bydureon 2 mg bcise weekly.  She may need to be shown how to use the pen

## 2016-08-11 NOTE — Telephone Encounter (Signed)
patient called to advise that [facility] did not get the script for trulicity. She states that she is taking her last injection today. Please call patient at home # to advise status.

## 2016-08-12 MED ORDER — EXENATIDE ER 2 MG/0.85ML ~~LOC~~ AUIJ: 2.0000 mg | AUTO-INJECTOR | SUBCUTANEOUS | 2 refills | Status: DC

## 2016-08-12 NOTE — Addendum Note (Signed)
Addended by: Verlin Grills T on: 08/12/2016 09:22 AM   Modules accepted: Orders

## 2016-08-12 NOTE — Telephone Encounter (Signed)
Rx submitted and patient notified. Patient advised once she receives the [facility] from [facility] to let us know and we could have her come in and speak with linda on how to inject. She voiced understanding.

## 2016-08-17 DIAGNOSIS — J3089 Other allergic rhinitis: Secondary | ICD-10-CM | POA: Diagnosis not present

## 2016-08-18 ENCOUNTER — Telehealth: Payer: Self-pay | Admitting: Endocrinology

## 2016-08-18 NOTE — Telephone Encounter (Signed)
Patient's husband calling to figure out whats going on with prior authorization and medication. Please call patient's husband and advise.

## 2016-08-18 NOTE — Telephone Encounter (Signed)
Patient notified bydureon was submitted in place of the trulicity and he voiced understanding. [facility] is sending the medication out to the patient. I advised once the patient receives the medication to call back if she has any other questions.

## 2016-08-20 ENCOUNTER — Telehealth: Payer: Self-pay

## 2016-08-20 NOTE — Telephone Encounter (Signed)
Patient came to office because she was not sure that she was giving the Bydureon injection correctly. I reviewed with her and her husband and she had given to herself correctly and went over with both of them again. Advised to please call the office if they have any questions.

## 2016-08-31 DIAGNOSIS — J3081 Allergic rhinitis due to animal (cat) (dog) hair and dander: Secondary | ICD-10-CM | POA: Diagnosis not present

## 2016-08-31 DIAGNOSIS — J3089 Other allergic rhinitis: Secondary | ICD-10-CM | POA: Diagnosis not present

## 2016-09-01 DIAGNOSIS — J3089 Other allergic rhinitis: Secondary | ICD-10-CM | POA: Diagnosis not present

## 2016-09-14 DIAGNOSIS — J3089 Other allergic rhinitis: Secondary | ICD-10-CM | POA: Diagnosis not present

## 2016-09-15 NOTE — Telephone Encounter (Signed)
Made in error

## 2016-09-21 ENCOUNTER — Encounter: Payer: Self-pay | Admitting: Psychology

## 2016-09-21 ENCOUNTER — Encounter: Payer: Self-pay | Admitting: Internal Medicine

## 2016-09-21 ENCOUNTER — Ambulatory Visit (INDEPENDENT_AMBULATORY_CARE_PROVIDER_SITE_OTHER): Payer: Medicare Other | Admitting: Internal Medicine

## 2016-09-21 VITALS — BP 136/80 | HR 80 | Temp 97.7°F | Resp 14 | Ht 65.0 in | Wt 219.5 lb

## 2016-09-21 DIAGNOSIS — I10 Essential (primary) hypertension: Secondary | ICD-10-CM

## 2016-09-21 DIAGNOSIS — R4189 Other symptoms and signs involving cognitive functions and awareness: Secondary | ICD-10-CM | POA: Diagnosis not present

## 2016-09-21 LAB — CBC WITH DIFFERENTIAL/PLATELET
BASOS PCT: 0.6 % (ref 0.0–3.0)
Basophils Absolute: 0 10*3/uL (ref 0.0–0.1)
EOS PCT: 2.6 % (ref 0.0–5.0)
Eosinophils Absolute: 0.2 10*3/uL (ref 0.0–0.7)
HCT: 44.8 % (ref 36.0–46.0)
HEMOGLOBIN: 15 g/dL (ref 12.0–15.0)
Lymphocytes Relative: 39.2 % (ref 12.0–46.0)
Lymphs Abs: 2.3 10*3/uL (ref 0.7–4.0)
MCHC: 33.4 g/dL (ref 30.0–36.0)
MCV: 97 fl (ref 78.0–100.0)
MONOS PCT: 6.4 % (ref 3.0–12.0)
Monocytes Absolute: 0.4 10*3/uL (ref 0.1–1.0)
Neutro Abs: 3 10*3/uL (ref 1.4–7.7)
Neutrophils Relative %: 51.2 % (ref 43.0–77.0)
Platelets: 249 10*3/uL (ref 150.0–400.0)
RBC: 4.62 Mil/uL (ref 3.87–5.11)
RDW: 14.4 % (ref 11.5–15.5)
WBC: 5.9 10*3/uL (ref 4.0–10.5)

## 2016-09-21 LAB — TSH: TSH: 2.28 u[IU]/mL (ref 0.35–4.50)

## 2016-09-21 LAB — FOLATE: Folate: >24 ng/mL (ref >5.9)

## 2016-09-21 LAB — BASIC METABOLIC PANEL
BUN: 12 mg/dL (ref 6–23)
CHLORIDE: 101 meq/L (ref 96–112)
CO2: 30 mEq/L (ref 19–32)
Calcium: 9.7 mg/dL (ref 8.4–10.5)
Creatinine, Ser: 1.39 mg/dL — ABNORMAL HIGH (ref 0.40–1.20)
GFR: 47.1 mL/min — AB (ref >60.00)
GLUCOSE: 163 mg/dL — AB (ref 70–99)
POTASSIUM: 3.9 meq/L (ref 3.5–5.1)
SODIUM: 139 meq/L (ref 135–145)

## 2016-09-21 LAB — VITAMIN B12: Vitamin B-12: 1460 pg/mL — ABNORMAL HIGH (ref 211–911)

## 2016-09-21 LAB — SEDIMENTATION RATE: SED RATE: 11 mm/h (ref 0–30)

## 2016-09-21 MED ORDER — LOSARTAN POTASSIUM 50 MG PO TABS: 50.0000 mg | ORAL_TABLET | Freq: Every day | ORAL | 1 refills | Status: DC

## 2016-09-21 MED ORDER — OMEPRAZOLE 20 MG PO CPDR: 20.0000 mg | DELAYED_RELEASE_CAPSULE | Freq: Every day | ORAL | 1 refills | Status: DC

## 2016-09-21 MED ORDER — AMLODIPINE BESYLATE 5 MG PO TABS: 5.0000 mg | ORAL_TABLET | Freq: Every day | ORAL | 1 refills | Status: DC

## 2016-09-21 MED ORDER — CROMOLYN SODIUM 5.2 MG/ACT NA AERS
1.0000 | INHALATION_SPRAY | Freq: Four times a day (QID) | NASAL | 1 refills | Status: DC
Start: 2016-09-21 — End: 2019-08-16

## 2016-09-21 MED ORDER — AZELASTINE HCL 0.1 % NA SOLN: 2.0000 | Freq: Every evening | NASAL | 1 refills | Status: DC | PRN

## 2016-09-21 MED ORDER — DONEPEZIL HCL 5 MG PO TABS: 5.0000 mg | ORAL_TABLET | Freq: Every day | ORAL | 6 refills | Status: DC

## 2016-09-21 MED ORDER — FUROSEMIDE 20 MG PO TABS: 40.0000 mg | ORAL_TABLET | Freq: Every day | ORAL | 1 refills | Status: DC

## 2016-09-21 MED ORDER — LORATADINE 10 MG PO TABS: 10.0000 mg | ORAL_TABLET | Freq: Every day | ORAL | 1 refills | Status: DC

## 2016-09-21 MED ORDER — ATORVASTATIN CALCIUM 10 MG PO TABS: 10.0000 mg | ORAL_TABLET | Freq: Every day | ORAL | 1 refills | Status: DC

## 2016-09-21 NOTE — Patient Instructions (Signed)
GO TO THE LAB : Get the blood work     GO TO THE FRONT DESK Schedule your next appointment for a  checkup in 3 months  Start Aricept 5 mg one tablet daily  Watch for side effects  We are referring you to a psychologist at the neurology

## 2016-09-21 NOTE — Progress Notes (Signed)
Subjective:    Patient ID: Angel Green, female    DOB: May 02, 1940, 76 y.o.   MRN: [id number]  DOS:  09/21/2016 Type of visit - description :  Routine visit Interval history: Here with her husband, he is quite concerned about the patient's memory. She got lost one time in the last few months. She's also constantly repeating the same questions. Symptoms started gradually few months ago.  Good med compliance .  Wt Readings from Last 3 Encounters:  09/21/16 219 lb 8 oz (99.6 kg)  06/18/16 222 lb (100.7 kg)  03/23/16 226 lb (102.5 kg)     Review of Systems In general she feels well except for some lack of energy. Denies any head injury recently. Denies anxiety, depression. She sleeps well and does not snore. No headache or dizziness No paresthesias No stroke symptoms such as diplopia, slurred speech or motor deficits  Past Medical History:  Diagnosis Date  . Allergy    Dust Mites  . Ankle pain    chronic  . Diabetes mellitus, type 2 ([place])   . Hyperlipidemia   . Hypertension   . Osteoarthritis   . RENAL INSUFFICIENCY, CHRONIC 04/16/2010  . Rhinitis    allergic nos    Past Surgical History:  Procedure Laterality Date  . CATARACT EXTRACTION    . CHOLECYSTECTOMY    . dental implants    . PARTIAL HYSTERECTOMY     in her 39s    Social History   Social History  . Marital status: Married    Spouse name: N/A  . Number of children: 2  . Years of education: N/A   Occupational History  .  retired    Social History Main Topics  . Smoking status: Never Smoker  . Smokeless tobacco: Never Used  . Alcohol use No  . Drug use: No  . Sexual activity: Yes    Partners: Male   Other Topics Concern  . Not on file   Social History Narrative    husband is a Company secretary                  Allergies as of 09/21/2016   No Known Allergies     Medication List       Accurate as of 09/21/16 11:59 PM. Always use your most recent med list.          amLODipine 5 MG  tablet Commonly known as:  NORVASC Take 1 tablet (5 mg total) by mouth daily.   aspirin 81 MG tablet Take 81 mg by mouth daily.   atorvastatin 10 MG tablet Commonly known as:  LIPITOR Take 1 tablet (10 mg total) by mouth daily.   azelastine 0.1 % nasal spray Commonly known as:  ASTELIN Place 2 sprays into both nostrils at bedtime as needed for rhinitis. Use in each nostril as directed   B-12 PO Take 1 tablet by mouth daily.   CENTRUM SILVER PO Take 1 tablet by mouth daily.   cromolyn 5.2 MG/ACT nasal spray Commonly known as:  NASALCROM Place 1 spray into both nostrils 4 (four) times daily.   donepezil 5 MG tablet Commonly known as:  ARICEPT Take 1 tablet (5 mg total) by mouth at bedtime.   Dulaglutide 0.75 MG/0.5ML Sopn Commonly known as:  TRULICITY Inject 2.95 mg into the skin once a week.   EPINEPHrine 0.3 mg/0.3 mL Soaj injection Commonly known as:  EPI-PEN Inject 0.3 mg into the muscle once. Reported on 06/30/2015   Exenatide  ER 2 MG/0.85ML Auij Commonly known as:  BYDUREON BCISE Inject 2 mg into the skin once a week.   fluticasone 44 MCG/ACT inhaler Commonly known as:  FLOVENT HFA Inhale 2 puffs into the lungs 2 (two) times daily.   fluticasone 50 MCG/ACT nasal spray Commonly known as:  FLONASE USE 1 TO 2 SPRAYS IN EACH NOSTRIL DAILY   furosemide 20 MG tablet Commonly known as:  LASIX Take 2 tablets (40 mg total) by mouth daily.   Insulin Lispro Prot & Lispro (75-25) 100 UNIT/ML Kwikpen Commonly known as:  HUMALOG MIX 75/25 KWIKPEN Inject 55 Units into the skin daily with breakfast.   loratadine 10 MG tablet Commonly known as:  CLARITIN Take 1 tablet (10 mg total) by mouth daily.   losartan 50 MG tablet Commonly known as:  COZAAR Take 1 tablet (50 mg total) by mouth daily.   omeprazole 20 MG capsule Commonly known as:  PRILOSEC Take 1 capsule (20 mg total) by mouth daily.   SURE COMFORT PEN NEEDLES 31G X 5 MM Misc Generic drug:  Insulin Pen  Needle Use to inject insulin 1 time per day.   SYSTANE 0.4-0.3 % Soln Generic drug:  Polyethyl Glycol-Propyl Glycol Apply 1 drop to eye daily as needed (for dry eyes).          Objective:   Physical Exam BP 136/80 (BP Location: Left Arm, Patient Position: Sitting, Cuff Size: Normal)   Pulse 80   Temp 97.7 F (36.5 C) (Oral)   Resp 14   Ht 5\' 5"  (1.651 m)   Wt 219 lb 8 oz (99.6 kg)   SpO2 93%   BMI 36.53 kg/m  General:   Well developed, well nourished . NAD.  HEENT:  Normocephalic . Face symmetric, atraumatic  Neurologic:  Alert MMSE: scored 24 .  Speech normal, gait appropriate for age and unassisted Psych--  Cognition and judgment appear intact.  Cooperative with normal attention span and concentration.  Behavior appropriate. No anxious or depressed appearing but she did get  defensive when I administered the MMSE        Assessment & Plan:   Assessment   DM  Dr Angel Green + Neuropathy: Per foot exam 12-2014 HTN ---change ACE to ARB is 03/2014 Hyperlipidemia CRI  dx 2012  Sees Dr Angel Green  Morbid obesity DJD, had  chronic ankle pain Allergies -- dust mites , occ uses a inhaler , sees allergist, has shots q weeks started ~ 08-2014  PLAN: Mild cognitive impairment: Memory issues for the last few months, MMSE scored 24 (mild ). The dx was d/w pt and her husband, likely multifactorial. Interestingly, she started G LP-1 when sx were first noted. Literature reviewed, impairment memory is not a side effect of such meds.. Will get a TSH, T36, folic acid, sedimentation rate, RPR.  Refer to psychology for more in-depth memory evaluation. Rx options include stay physically and mentally active, socialize and medication. She is interested on medication, will try Aricept, s/e and  expectations discussed. HTN: Seems to be controlled on amlodipine, Lasix, losartan. Check a BMP, CBC RTC 3 months Today, I spent more than  35  min with the patient: >50% of the time counseling  regards  the new diagnosis of cognitive impairment, discussing treatment, medications options and side effects.

## 2016-09-21 NOTE — Progress Notes (Signed)
Pre visit review using our clinic review tool, if applicable. No additional management support is needed unless otherwise documented below in the visit note. 

## 2016-09-22 DIAGNOSIS — R4189 Other symptoms and signs involving cognitive functions and awareness: Secondary | ICD-10-CM | POA: Insufficient documentation

## 2016-09-22 LAB — RPR

## 2016-09-22 NOTE — Assessment & Plan Note (Signed)
Mild cognitive impairment: Memory issues for the last few months, MMSE scored 24 (mild ). The dx was d/w pt and her husband, likely multifactorial. Interestingly, she started G LP-1 when sx were first noted. Literature reviewed, impairment memory is not a side effect of such meds.. Will get a TSH, W38, folic acid, sedimentation rate, RPR.  Refer to psychology for more in-depth memory evaluation. Rx options include stay physically and mentally active, socialize and medication. She is interested on medication, will try Aricept, s/e and  expectations discussed. HTN: Seems to be controlled on amlodipine, Lasix, losartan. Check a BMP, CBC RTC 3 months

## 2016-09-28 DIAGNOSIS — J3089 Other allergic rhinitis: Secondary | ICD-10-CM | POA: Diagnosis not present

## 2016-10-04 ENCOUNTER — Encounter: Payer: Self-pay | Admitting: Endocrinology

## 2016-10-04 ENCOUNTER — Ambulatory Visit (INDEPENDENT_AMBULATORY_CARE_PROVIDER_SITE_OTHER): Payer: Medicare Other | Admitting: Endocrinology

## 2016-10-04 VITALS — BP 132/84 | HR 87 | Ht 65.0 in | Wt 221.0 lb

## 2016-10-04 DIAGNOSIS — E118 Type 2 diabetes mellitus with unspecified complications: Secondary | ICD-10-CM | POA: Diagnosis not present

## 2016-10-04 MED ORDER — EXENATIDE ER 2 MG/0.85ML ~~LOC~~ AUIJ: 2.0000 mg | AUTO-INJECTOR | SUBCUTANEOUS | 3 refills | Status: DC

## 2016-10-04 MED ORDER — INSULIN LISPRO PROT & LISPRO (75-25 MIX) 100 UNIT/ML KWIKPEN: 65.0000 [IU] | PEN_INJECTOR | Freq: Every day | SUBCUTANEOUS | 5 refills | Status: DC

## 2016-10-04 MED ORDER — SURE COMFORT PEN NEEDLES 31G X 5 MM MISC: 2 refills | Status: DC

## 2016-10-04 NOTE — Progress Notes (Signed)
Subjective:    Patient ID: Angel Green, female    DOB: 1940/09/23, 76 y.o.   MRN: [id number]  HPI Pt returns for f/u of diabetes mellitus: DM type: Insulin-requiring type 2 Dx'ed: 9562 Complications: neuropathy of the lower extremities, and renal insufficiency.  Therapy: insulin since 2010, and bydureon.  GDM: never. DKA: never Severe hypoglycemia: never.  Pancreatitis: never.  Other: therapy has been limited by variable cbg's; she changed to QAM premixed insulin, after poor results with multiple daily injections; pattern of cbg's indicates she does not need a PM dose.   Interval history:  no cbg record, but states cbg's are in the mid-100's  There is no trend throughout the day.   Past Medical History:  Diagnosis Date  . Allergy    Dust Mites  . Ankle pain    chronic  . Diabetes mellitus, type 2 ([place])   . Hyperlipidemia   . Hypertension   . Osteoarthritis   . RENAL INSUFFICIENCY, CHRONIC 04/16/2010  . Rhinitis    allergic nos    Past Surgical History:  Procedure Laterality Date  . CATARACT EXTRACTION    . CHOLECYSTECTOMY    . dental implants    . PARTIAL HYSTERECTOMY     in her 60s    Social History   Social History  . Marital status: Married    Spouse name: N/A  . Number of children: 2  . Years of education: N/A   Occupational History  .  retired    Social History Main Topics  . Smoking status: Never Smoker  . Smokeless tobacco: Never Used  . Alcohol use No  . Drug use: No  . Sexual activity: Yes    Partners: Male   Other Topics Concern  . Not on file   Social History Narrative    husband is a Company secretary                Current Outpatient Prescriptions on File Prior to Visit  Medication Sig Dispense Refill  . amLODipine (NORVASC) 5 MG tablet Take 1 tablet (5 mg total) by mouth daily. 90 tablet 1  . aspirin 81 MG tablet Take 81 mg by mouth daily.      Marland Kitchen atorvastatin (LIPITOR) 10 MG tablet Take 1 tablet (10 mg total) by mouth daily. 90 tablet  1  . azelastine (ASTELIN) 0.1 % nasal spray Place 2 sprays into both nostrils at bedtime as needed for rhinitis. Use in each nostril as directed 90 mL 1  . cromolyn (NASALCROM) 5.2 MG/ACT nasal spray Place 1 spray into both nostrils 4 (four) times daily. 39 mL 1  . Cyanocobalamin (B-12 PO) Take 1 tablet by mouth daily.     Marland Kitchen donepezil (ARICEPT) 5 MG tablet Take 1 tablet (5 mg total) by mouth at bedtime. 30 tablet 6  . EPINEPHrine 0.3 mg/0.3 mL IJ SOAJ injection Inject 0.3 mg into the muscle once. Reported on 06/30/2015    . fluticasone (FLONASE) 50 MCG/ACT nasal spray USE 1 TO 2 SPRAYS IN EACH NOSTRIL DAILY 16 g 3  . fluticasone (FLOVENT HFA) 44 MCG/ACT inhaler Inhale 2 puffs into the lungs 2 (two) times daily. 3 Inhaler 3  . furosemide (LASIX) 20 MG tablet Take 2 tablets (40 mg total) by mouth daily. 180 tablet 1  . loratadine (CLARITIN) 10 MG tablet Take 1 tablet (10 mg total) by mouth daily. 90 tablet 1  . losartan (COZAAR) 50 MG tablet Take 1 tablet (50 mg total) by mouth daily.  90 tablet 1  . Multiple Vitamins-Minerals (CENTRUM SILVER PO) Take 1 tablet by mouth daily.      Marland Kitchen omeprazole (PRILOSEC) 20 MG capsule Take 1 capsule (20 mg total) by mouth daily. 90 capsule 1  . Polyethyl Glycol-Propyl Glycol (SYSTANE) 0.4-0.3 % SOLN Apply 1 drop to eye daily as needed (for dry eyes).     No current facility-administered medications on file prior to visit.     No Known Allergies  Family History  Problem Relation Age of Onset  . Healthy Brother   . Healthy Son   . Healthy Son   . Heart attack Neg Hx   . Colon cancer Neg Hx   . Breast cancer Neg Hx   . Stroke Neg Hx     BP 132/84   Pulse 87   Ht 5\' 5"  (1.651 m)   Wt 221 lb (100.2 kg)   SpO2 96%   BMI 36.78 kg/m    Review of Systems She denies hypoglycemia.     Objective:   Physical Exam VITAL SIGNS:  See vs page GENERAL: no distress Pulses: foot pulses are intact bilaterally.   MSK: no deformity of the feet or ankles.  CV:   trace bilat leg edema.  Skin:  no ulcer on the feet or ankles.  normal color and temp on the feet and ankles Neuro: sensation is intact to touch on the feet and ankles.    A1c=8.4%    Assessment & Plan:  Insulin-requiring type 2 DM, with renal insuff: she needs increased rx.   Patient Instructions  Please increase the insulin to 65 units each morning, and:  Please continue the same Bydureon.   check your blood sugar twice a day.  vary the time of day when you check, between before the 3 meals, and at bedtime.  also check if you have symptoms of your blood sugar being too high or too low.  please keep a record of the readings and bring it to your next appointment here (or you can bring the meter itself).  You can write it on any piece of paper.  please call us sooner if your blood sugar goes below 70, or if you have a lot of readings over 200.   Please come back for a follow-up appointment in 2 months.

## 2016-10-04 NOTE — Patient Instructions (Addendum)
Please increase the insulin to 65 units each morning, and:  Please continue the same Bydureon.   check your blood sugar twice a day.  vary the time of day when you check, between before the 3 meals, and at bedtime.  also check if you have symptoms of your blood sugar being too high or too low.  please keep a record of the readings and bring it to your next appointment here (or you can bring the meter itself).  You can write it on any piece of paper.  please call us sooner if your blood sugar goes below 70, or if you have a lot of readings over 200.   Please come back for a follow-up appointment in 2 months.

## 2016-10-05 ENCOUNTER — Telehealth: Payer: Self-pay | Admitting: Endocrinology

## 2016-10-05 LAB — POCT GLYCOSYLATED HEMOGLOBIN (HGB A1C): Hemoglobin A1C: 8.8

## 2016-10-05 NOTE — Telephone Encounter (Signed)
Patient calling to cancel script for pen needles. She said she has enough until [facility] provide 90 day supply.  Thank you,  -LL

## 2016-10-11 DIAGNOSIS — E785 Hyperlipidemia, unspecified: Secondary | ICD-10-CM | POA: Diagnosis not present

## 2016-10-11 DIAGNOSIS — D631 Anemia in chronic kidney disease: Secondary | ICD-10-CM | POA: Diagnosis not present

## 2016-10-11 DIAGNOSIS — E1129 Type 2 diabetes mellitus with other diabetic kidney complication: Secondary | ICD-10-CM | POA: Diagnosis not present

## 2016-10-11 DIAGNOSIS — I129 Hypertensive chronic kidney disease with stage 1 through stage 4 chronic kidney disease, or unspecified chronic kidney disease: Secondary | ICD-10-CM | POA: Diagnosis not present

## 2016-10-11 DIAGNOSIS — E669 Obesity, unspecified: Secondary | ICD-10-CM | POA: Diagnosis not present

## 2016-10-11 DIAGNOSIS — N2581 Secondary hyperparathyroidism of renal origin: Secondary | ICD-10-CM | POA: Diagnosis not present

## 2016-10-11 DIAGNOSIS — N183 Chronic kidney disease, stage 3 (moderate): Secondary | ICD-10-CM | POA: Diagnosis not present

## 2016-10-13 DIAGNOSIS — J3089 Other allergic rhinitis: Secondary | ICD-10-CM | POA: Diagnosis not present

## 2016-10-18 DIAGNOSIS — J3089 Other allergic rhinitis: Secondary | ICD-10-CM | POA: Diagnosis not present

## 2016-10-22 DIAGNOSIS — J3089 Other allergic rhinitis: Secondary | ICD-10-CM | POA: Diagnosis not present

## 2016-10-26 DIAGNOSIS — J3089 Other allergic rhinitis: Secondary | ICD-10-CM | POA: Diagnosis not present

## 2016-10-29 DIAGNOSIS — J3089 Other allergic rhinitis: Secondary | ICD-10-CM | POA: Diagnosis not present

## 2016-11-02 DIAGNOSIS — J3089 Other allergic rhinitis: Secondary | ICD-10-CM | POA: Diagnosis not present

## 2016-11-15 DIAGNOSIS — J3089 Other allergic rhinitis: Secondary | ICD-10-CM | POA: Diagnosis not present

## 2016-11-29 DIAGNOSIS — J3089 Other allergic rhinitis: Secondary | ICD-10-CM | POA: Diagnosis not present

## 2016-11-29 DIAGNOSIS — J301 Allergic rhinitis due to pollen: Secondary | ICD-10-CM | POA: Diagnosis not present

## 2016-12-03 ENCOUNTER — Other Ambulatory Visit: Payer: Self-pay | Admitting: Internal Medicine

## 2016-12-03 DIAGNOSIS — Z1231 Encounter for screening mammogram for malignant neoplasm of breast: Secondary | ICD-10-CM

## 2016-12-06 ENCOUNTER — Encounter: Payer: Medicare Other | Admitting: Psychology

## 2016-12-06 ENCOUNTER — Encounter: Payer: Self-pay | Admitting: Endocrinology

## 2016-12-06 ENCOUNTER — Ambulatory Visit (INDEPENDENT_AMBULATORY_CARE_PROVIDER_SITE_OTHER): Payer: Medicare Other | Admitting: Endocrinology

## 2016-12-06 VITALS — BP 130/82 | HR 90 | Wt 217.4 lb

## 2016-12-06 DIAGNOSIS — Z23 Encounter for immunization: Secondary | ICD-10-CM

## 2016-12-06 DIAGNOSIS — E118 Type 2 diabetes mellitus with unspecified complications: Secondary | ICD-10-CM

## 2016-12-06 LAB — POCT GLYCOSYLATED HEMOGLOBIN (HGB A1C): Hemoglobin A1C: 7.6

## 2016-12-06 NOTE — Patient Instructions (Addendum)
Please continue the same medications for diabetes. check your blood sugar twice a day.  vary the time of day when you check, between before the 3 meals, and at bedtime.  also check if you have symptoms of your blood sugar being too high or too low.  please keep a record of the readings and bring it to your next appointment here (or you can bring the meter itself).  You can write it on any piece of paper.  please call us sooner if your blood sugar goes below 70, or if you have a lot of readings over 200. Please come back for a follow-up appointment in 4 months.   

## 2016-12-06 NOTE — Progress Notes (Signed)
Subjective:    Patient ID: Angel Green, female    DOB: [birth year], 76 y.o.   MRN: [id number]  HPI Pt returns for f/u of diabetes mellitus: DM type: Insulin-requiring type 2 Dx'ed: 2563 Complications: neuropathy of the lower extremities, and renal insufficiency.  Therapy: insulin since 2010, and bydureon.  GDM: never.  DKA: never Severe hypoglycemia: never.  Pancreatitis: never.  Other: therapy has been limited by variable cbg's; she changed to QAM premixed insulin, after poor results with multiple daily injections; pattern of cbg's indicates she does not need a PM dose.   Interval history:  no cbg record, but states cbg's are well-controlled.  There is no trend throughout the day.  pt states she feels well in general.  Past Medical History:  Diagnosis Date  . Allergy    Dust Mites  . Ankle pain    chronic  . Diabetes mellitus, type 2 ([place])   . Hyperlipidemia   . Hypertension   . Osteoarthritis   . RENAL INSUFFICIENCY, CHRONIC 04/16/2010  . Rhinitis    allergic nos    Past Surgical History:  Procedure Laterality Date  . CATARACT EXTRACTION    . CHOLECYSTECTOMY    . dental implants    . PARTIAL HYSTERECTOMY     in her 85s    Social History   Social History  . Marital status: Married    Spouse name: N/A  . Number of children: 2  . Years of education: N/A   Occupational History  .  retired    Social History Main Topics  . Smoking status: Never Smoker  . Smokeless tobacco: Never Used  . Alcohol use No  . Drug use: No  . Sexual activity: Yes    Partners: Male   Other Topics Concern  . Not on file   Social History Narrative    husband is a Company secretary                Current Outpatient Prescriptions on File Prior to Visit  Medication Sig Dispense Refill  . amLODipine (NORVASC) 5 MG tablet Take 1 tablet (5 mg total) by mouth daily. 90 tablet 1  . aspirin 81 MG tablet Take 81 mg by mouth daily.      Marland Kitchen atorvastatin (LIPITOR) 10 MG tablet Take 1 tablet  (10 mg total) by mouth daily. 90 tablet 1  . azelastine (ASTELIN) 0.1 % nasal spray Place 2 sprays into both nostrils at bedtime as needed for rhinitis. Use in each nostril as directed 90 mL 1  . cromolyn (NASALCROM) 5.2 MG/ACT nasal spray Place 1 spray into both nostrils 4 (four) times daily. 39 mL 1  . Cyanocobalamin (B-12 PO) Take 1 tablet by mouth daily.     Marland Kitchen donepezil (ARICEPT) 5 MG tablet Take 1 tablet (5 mg total) by mouth at bedtime. 30 tablet 6  . EPINEPHrine 0.3 mg/0.3 mL IJ SOAJ injection Inject 0.3 mg into the muscle once. Reported on 06/30/2015    . Exenatide ER (BYDUREON BCISE) 2 MG/0.85ML AUIJ Inject 2 mg into the skin once a week. 12 pen 3  . fluticasone (FLONASE) 50 MCG/ACT nasal spray USE 1 TO 2 SPRAYS IN EACH NOSTRIL DAILY 16 g 3  . fluticasone (FLOVENT HFA) 44 MCG/ACT inhaler Inhale 2 puffs into the lungs 2 (two) times daily. 3 Inhaler 3  . furosemide (LASIX) 20 MG tablet Take 2 tablets (40 mg total) by mouth daily. 180 tablet 1  . Insulin Lispro Prot & Lispro (HUMALOG  MIX 75/25 KWIKPEN) (75-25) 100 UNIT/ML Kwikpen Inject 65 Units into the skin daily with breakfast. 60 mL 5  . loratadine (CLARITIN) 10 MG tablet Take 1 tablet (10 mg total) by mouth daily. 90 tablet 1  . losartan (COZAAR) 50 MG tablet Take 1 tablet (50 mg total) by mouth daily. 90 tablet 1  . Multiple Vitamins-Minerals (CENTRUM SILVER PO) Take 1 tablet by mouth daily.      Marland Kitchen omeprazole (PRILOSEC) 20 MG capsule Take 1 capsule (20 mg total) by mouth daily. 90 capsule 1  . Polyethyl Glycol-Propyl Glycol (SYSTANE) 0.4-0.3 % SOLN Apply 1 drop to eye daily as needed (for dry eyes).    . SURE COMFORT PEN NEEDLES 31G X 5 MM MISC Use to inject insulin 1 time per day. 100 each 2   No current facility-administered medications on file prior to visit.     No Known Allergies  Family History  Problem Relation Age of Onset  . Healthy Brother   . Healthy Son   . Healthy Son   . Heart attack Neg Hx   . Colon cancer Neg Hx    . Breast cancer Neg Hx   . Stroke Neg Hx     BP 130/82   Pulse 90   Wt 217 lb 6.4 oz (98.6 kg)   SpO2 98%   BMI 36.18 kg/m    Review of Systems She denies hypoglycemia.      Objective:   Physical Exam VITAL SIGNS:  See vs page GENERAL: no distress Pulses: foot pulses are intact bilaterally.   MSK: no deformity of the feet or ankles.  CV:  trace bilat leg edema.  Skin:  no ulcer on the feet or ankles.  normal color and temp on the feet and ankles.  Neuro: sensation is intact to touch on the feet and ankles.   Lab Results  Component Value Date   HGBA1C 7.6 12/06/2016      Assessment & Plan:  Insulin-requiring type 2 DM, with polyneuropathy: this is the best control this pt should aim for, given this regimen, which does match insulin to her changing needs throughout the day Renal insuff: this increases the risk of hypoglycemia  Patient Instructions  Please continue the same medications for diabetes.  check your blood sugar twice a day.  vary the time of day when you check, between before the 3 meals, and at bedtime.  also check if you have symptoms of your blood sugar being too high or too low.  please keep a record of the readings and bring it to your next appointment here (or you can bring the meter itself).  You can write it on any piece of paper.  please call us sooner if your blood sugar goes below 70, or if you have a lot of readings over 200.   Please come back for a follow-up appointment in 4 months.

## 2016-12-07 DIAGNOSIS — J3089 Other allergic rhinitis: Secondary | ICD-10-CM | POA: Diagnosis not present

## 2016-12-21 DIAGNOSIS — J3089 Other allergic rhinitis: Secondary | ICD-10-CM | POA: Diagnosis not present

## 2016-12-22 ENCOUNTER — Ambulatory Visit (INDEPENDENT_AMBULATORY_CARE_PROVIDER_SITE_OTHER): Payer: Medicare Other | Admitting: Internal Medicine

## 2016-12-22 ENCOUNTER — Encounter: Payer: Self-pay | Admitting: Internal Medicine

## 2016-12-22 VITALS — BP 126/68 | HR 76 | Temp 97.7°F | Resp 14 | Ht 65.0 in | Wt 217.1 lb

## 2016-12-22 DIAGNOSIS — I10 Essential (primary) hypertension: Secondary | ICD-10-CM

## 2016-12-22 DIAGNOSIS — R4189 Other symptoms and signs involving cognitive functions and awareness: Secondary | ICD-10-CM

## 2016-12-22 MED ORDER — DONEPEZIL HCL 10 MG PO TABS: 10.0000 mg | ORAL_TABLET | Freq: Every day | ORAL | 1 refills | Status: DC

## 2016-12-22 NOTE — Progress Notes (Signed)
Subjective:    Patient ID: Angel Green, female    DOB: 01/09/1941, 76 y.o.   MRN: [id number]  DOS:  12/22/2016 Type of visit - description : rov, here by herself. Interval history: Since the last OV, she  started aricept, good compliance, no apparent side effects, states that she feels great. She is completely independent. Denies any memory problems at this point. Good compliance with medications.   Review of Systems Denies nausea, vomiting, diarrhea  Past Medical History:  Diagnosis Date  . Allergy    Dust Mites  . Ankle pain    chronic  . Diabetes mellitus, type 2 ([place])   . Hyperlipidemia   . Hypertension   . Osteoarthritis   . RENAL INSUFFICIENCY, CHRONIC 04/16/2010  . Rhinitis    allergic nos    Past Surgical History:  Procedure Laterality Date  . CATARACT EXTRACTION    . CHOLECYSTECTOMY    . dental implants    . PARTIAL HYSTERECTOMY     in her 30s    Social History   Social History  . Marital status: Married    Spouse name: N/A  . Number of children: 2  . Years of education: N/A   Occupational History  .  retired    Social History Main Topics  . Smoking status: Never Smoker  . Smokeless tobacco: Never Used  . Alcohol use No  . Drug use: No  . Sexual activity: Yes    Partners: Male   Other Topics Concern  . Not on file   Social History Narrative    husband is a Company secretary                  Allergies as of 12/22/2016   No Known Allergies     Medication List       Accurate as of 12/22/16 11:59 PM. Always use your most recent med list.          amLODipine 5 MG tablet Commonly known as:  NORVASC Take 1 tablet (5 mg total) by mouth daily.   aspirin 81 MG tablet Take 81 mg by mouth daily.   atorvastatin 10 MG tablet Commonly known as:  LIPITOR Take 1 tablet (10 mg total) by mouth daily.   azelastine 0.1 % nasal spray Commonly known as:  ASTELIN Place 2 sprays into both nostrils at bedtime as needed for rhinitis. Use in  each nostril as directed   B-12 PO Take 1 tablet by mouth daily.   CENTRUM SILVER PO Take 1 tablet by mouth daily.   cromolyn 5.2 MG/ACT nasal spray Commonly known as:  NASALCROM Place 1 spray into both nostrils 4 (four) times daily.   donepezil 10 MG tablet Commonly known as:  ARICEPT Take 1 tablet (10 mg total) by mouth at bedtime.   EPINEPHrine 0.3 mg/0.3 mL Soaj injection Commonly known as:  EPI-PEN Inject 0.3 mg into the muscle once. Reported on 06/30/2015   Exenatide ER 2 MG/0.85ML Auij Commonly known as:  BYDUREON BCISE Inject 2 mg into the skin once a week.   fluticasone 44 MCG/ACT inhaler Commonly known as:  FLOVENT HFA Inhale 2 puffs into the lungs 2 (two) times daily.   fluticasone 50 MCG/ACT nasal spray Commonly known as:  FLONASE USE 1 TO 2 SPRAYS IN EACH NOSTRIL DAILY   furosemide 20 MG tablet Commonly known as:  LASIX Take 2 tablets (40 mg total) by mouth daily.   Insulin Lispro Prot & Lispro (75-25) 100 UNIT/ML [employer]  Commonly known as:  HUMALOG MIX 75/25 KWIKPEN Inject 65 Units into the skin daily with breakfast.   loratadine 10 MG tablet Commonly known as:  CLARITIN Take 1 tablet (10 mg total) by mouth daily.   losartan 50 MG tablet Commonly known as:  COZAAR Take 1 tablet (50 mg total) by mouth daily.   omeprazole 20 MG capsule Commonly known as:  PRILOSEC Take 1 capsule (20 mg total) by mouth daily.   SURE COMFORT PEN NEEDLES 31G X 5 MM Misc Generic drug:  Insulin Pen Needle Use to inject insulin 1 time per day.   SYSTANE 0.4-0.3 % Soln Generic drug:  Polyethyl Glycol-Propyl Glycol Apply 1 drop to eye daily as needed (for dry eyes).          Objective:   Physical Exam BP 126/68 (BP Location: Left Wrist, Patient Position: Sitting, Cuff Size: Small)   Pulse 76   Temp 97.7 F (36.5 C) (Oral)   Resp 14   Ht 5\' 5"  (1.651 m)   Wt 217 lb 2 oz (98.5 kg)   SpO2 96%   BMI 36.13 kg/m   General:   Well developed,  overweight  appearing . NAD.  HEENT:  Normocephalic . Face symmetric, atraumatic Lungs:  CTA B Normal respiratory effort, no intercostal retractions, no accessory muscle use. Heart: RRR,  no murmur.  No pretibial edema bilaterally  Skin: Not pale. Not jaundice Neurologic:  alert & oriented to self, space, time.  Speech normal, gait appropriate for age and unassisted Psych--  Cognition and judgment appear intact.  Cooperative with normal attention span and concentration.  Behavior appropriate. No anxious or depressed appearing.      Assessment & Plan:   Assessment   DM  Dr Loanne Drilling + Neuropathy: Per foot exam 12-2014 HTN ---change ACE to ARB is 03/2014 Hyperlipidemia CRI  dx 2012  Sees Dr Arty Baumgartner  Morbid obesity DJD, had  chronic ankle pain Allergies -- dust mites , occ uses a inhaler , sees allergist, has shots q weeks started ~ 08-2014  PLAN: [facility]CI: see last OV, labs were ok, on Aricept, no apparent s/e, reports she is feeling great.  Patient's husband is not here today to confirm.  Plan: Increase Aricept to 10 mg, watch for side effects. HTN: Seems well controlled, continue same medications Diabetes: Per endocrinology, last A1c 7.6. RTC 4 months, CPX

## 2016-12-22 NOTE — Progress Notes (Signed)
Pre visit review using our clinic review tool, if applicable. No additional management support is needed unless otherwise documented below in the visit note. 

## 2016-12-22 NOTE — Patient Instructions (Signed)
GO TO THE FRONT DESK Schedule your next appointment for a physical exam in 4 months from today  Increase Aricept (donepezil ) from 5 mg to 10 mg: 1 tablet every night.  Were sending prescription to [facility].

## 2016-12-23 NOTE — Assessment & Plan Note (Signed)
MCI: see last OV, labs were ok, on Aricept, no apparent s/e, reports she is feeling great.  Patient's husband is not here today to confirm.  Plan: Increase Aricept to 10 mg, watch for side effects. HTN: Seems well controlled, continue same medications Diabetes: Per endocrinology, last A1c 7.6. RTC 4 months, CPX

## 2016-12-28 ENCOUNTER — Encounter: Payer: Medicare Other | Admitting: Psychology

## 2017-01-04 DIAGNOSIS — J3089 Other allergic rhinitis: Secondary | ICD-10-CM | POA: Diagnosis not present

## 2017-01-17 ENCOUNTER — Ambulatory Visit
Admission: RE | Admit: 2017-01-17 | Discharge: 2017-01-17 | Disposition: A | Discharge status: Home / Self Care | Payer: Medicare Other | Source: Ambulatory Visit | Attending: Internal Medicine | Admitting: Internal Medicine

## 2017-01-17 DIAGNOSIS — Z1231 Encounter for screening mammogram for malignant neoplasm of breast: Secondary | ICD-10-CM

## 2017-01-17 DIAGNOSIS — J3089 Other allergic rhinitis: Secondary | ICD-10-CM | POA: Diagnosis not present

## 2017-02-01 ENCOUNTER — Other Ambulatory Visit: Payer: Self-pay | Admitting: Endocrinology

## 2017-02-01 DIAGNOSIS — J3089 Other allergic rhinitis: Secondary | ICD-10-CM | POA: Diagnosis not present

## 2017-02-01 MED ORDER — EXENATIDE ER 2 MG/0.85ML ~~LOC~~ AUIJ: 2.0000 mg | AUTO-INJECTOR | SUBCUTANEOUS | 3 refills | Status: DC

## 2017-02-23 DIAGNOSIS — J3089 Other allergic rhinitis: Secondary | ICD-10-CM | POA: Diagnosis not present

## 2017-03-08 DIAGNOSIS — J3089 Other allergic rhinitis: Secondary | ICD-10-CM | POA: Diagnosis not present

## 2017-03-22 ENCOUNTER — Other Ambulatory Visit: Payer: Self-pay | Admitting: Internal Medicine

## 2017-03-22 DIAGNOSIS — J3089 Other allergic rhinitis: Secondary | ICD-10-CM | POA: Diagnosis not present

## 2017-04-04 ENCOUNTER — Emergency Department (HOSPITAL_COMMUNITY)
Admission: EM | Admit: 2017-04-04 | Discharge: 2017-04-04 | Disposition: A | Discharge status: Home / Self Care | Payer: Medicare Other | Attending: Emergency Medicine | Admitting: Emergency Medicine

## 2017-04-04 ENCOUNTER — Emergency Department (HOSPITAL_COMMUNITY): Payer: Medicare Other

## 2017-04-04 ENCOUNTER — Encounter (HOSPITAL_COMMUNITY): Payer: Self-pay

## 2017-04-04 ENCOUNTER — Other Ambulatory Visit: Payer: Self-pay

## 2017-04-04 DIAGNOSIS — S99912A Unspecified injury of left ankle, initial encounter: Secondary | ICD-10-CM | POA: Diagnosis not present

## 2017-04-04 DIAGNOSIS — S99922A Unspecified injury of left foot, initial encounter: Secondary | ICD-10-CM | POA: Diagnosis present

## 2017-04-04 DIAGNOSIS — R42 Dizziness and giddiness: Secondary | ICD-10-CM | POA: Diagnosis not present

## 2017-04-04 DIAGNOSIS — Y9301 Activity, walking, marching and hiking: Secondary | ICD-10-CM | POA: Insufficient documentation

## 2017-04-04 DIAGNOSIS — E1122 Type 2 diabetes mellitus with diabetic chronic kidney disease: Secondary | ICD-10-CM | POA: Diagnosis not present

## 2017-04-04 DIAGNOSIS — Z7982 Long term (current) use of aspirin: Secondary | ICD-10-CM | POA: Diagnosis not present

## 2017-04-04 DIAGNOSIS — M7989 Other specified soft tissue disorders: Secondary | ICD-10-CM | POA: Diagnosis not present

## 2017-04-04 DIAGNOSIS — S92352A Displaced fracture of fifth metatarsal bone, left foot, initial encounter for closed fracture: Secondary | ICD-10-CM | POA: Insufficient documentation

## 2017-04-04 DIAGNOSIS — Y999 Unspecified external cause status: Secondary | ICD-10-CM | POA: Diagnosis not present

## 2017-04-04 DIAGNOSIS — Z79899 Other long term (current) drug therapy: Secondary | ICD-10-CM | POA: Insufficient documentation

## 2017-04-04 DIAGNOSIS — I10 Essential (primary) hypertension: Secondary | ICD-10-CM | POA: Diagnosis not present

## 2017-04-04 DIAGNOSIS — Z794 Long term (current) use of insulin: Secondary | ICD-10-CM | POA: Insufficient documentation

## 2017-04-04 DIAGNOSIS — N189 Chronic kidney disease, unspecified: Secondary | ICD-10-CM | POA: Diagnosis not present

## 2017-04-04 DIAGNOSIS — Y929 Unspecified place or not applicable: Secondary | ICD-10-CM | POA: Insufficient documentation

## 2017-04-04 DIAGNOSIS — X509XXA Other and unspecified overexertion or strenuous movements or postures, initial encounter: Secondary | ICD-10-CM | POA: Diagnosis not present

## 2017-04-04 DIAGNOSIS — I129 Hypertensive chronic kidney disease with stage 1 through stage 4 chronic kidney disease, or unspecified chronic kidney disease: Secondary | ICD-10-CM | POA: Diagnosis not present

## 2017-04-04 LAB — URINALYSIS, ROUTINE W REFLEX MICROSCOPIC
BILIRUBIN URINE: NEGATIVE
Glucose, UA: NEGATIVE mg/dL
HGB URINE DIPSTICK: NEGATIVE
Ketones, ur: NEGATIVE mg/dL
Leukocytes, UA: NEGATIVE
Nitrite: NEGATIVE
Protein, ur: NEGATIVE mg/dL
SPECIFIC GRAVITY, URINE: 1.014 (ref 1.005–1.030)
pH: 5 (ref 5.0–8.0)

## 2017-04-04 LAB — CBC
HCT: 42.1 % (ref 36.0–46.0)
Hemoglobin: 14.4 g/dL (ref 12.0–15.0)
MCH: 32.1 pg (ref 26.0–34.0)
MCHC: 34.2 g/dL (ref 30.0–36.0)
MCV: 94 fL (ref 78.0–100.0)
PLATELETS: 230 10*3/uL (ref 150–400)
RBC: 4.48 MIL/uL (ref 3.87–5.11)
RDW: 14.1 % (ref 11.5–15.5)
WBC: 8.6 10*3/uL (ref 4.0–10.5)

## 2017-04-04 LAB — BASIC METABOLIC PANEL
Anion gap: 13 (ref 5–15)
BUN: 13 mg/dL (ref 6–20)
CALCIUM: 9.1 mg/dL (ref 8.9–10.3)
CO2: 22 mmol/L (ref 22–32)
CREATININE: 1.4 mg/dL — AB (ref 0.44–1.00)
Chloride: 104 mmol/L (ref 101–111)
GFR calc Af Amer: 40 mL/min — ABNORMAL LOW (ref >60)
GFR calc non Af Amer: 35 mL/min — ABNORMAL LOW (ref >60)
GLUCOSE: 198 mg/dL — AB (ref 65–99)
Potassium: 3.6 mmol/L (ref 3.5–5.1)
Sodium: 139 mmol/L (ref 135–145)

## 2017-04-04 NOTE — ED Triage Notes (Signed)
Per Pt, Pt is coming from home with complaints of a sudden onset of dizziness while she was walking down the stairs. Pt reports starting to fall forward, but she was able to catch herself. Sat down and all the dizziness subsided. Pt was able to walk back to her house and then she noted that her left ankle was hurting.

## 2017-04-04 NOTE — ED Provider Notes (Signed)
[facility] Provider Note   CSN: [id number] Arrival date & time: 04/04/17  1451     History   Chief Complaint Chief Complaint  Patient presents with  . Dizziness  . Ankle Pain    HPI Angel Green is a 77 y.o. female.  The history is provided by the patient.  Dizziness  Quality:  Imbalance Severity:  Mild Onset quality:  Sudden Duration: 2-3 minutes. Timing:  Constant Progression:  Resolved Chronicity:  New Context comment:  Started when walking down some stairs.  on the last step felt dizzy and twisted her foot but after standing for a few minutes the sx resolved and have not come back Relieved by:  Nothing Worsened by:  Nothing Ineffective treatments:  None tried Associated symptoms: no chest pain, no diarrhea, no headaches, no nausea, no palpitations, no shortness of breath, no syncope, no vision changes, no vomiting and no weakness   Associated symptoms comment:  After getting home pt noticed swelling and discoloration of the left foot with some mild pain Risk factors comment:  No recent med changes.  did eat today and not a sugar problem. Ankle Pain      Past Medical History:  Diagnosis Date  . Allergy    Dust Mites  . Ankle pain    chronic  . Diabetes mellitus, type 2 ([place])   . Hyperlipidemia   . Hypertension   . Osteoarthritis   . RENAL INSUFFICIENCY, CHRONIC 04/16/2010  . Rhinitis    allergic nos    Patient Active Problem List   Diagnosis Date Noted  . Cognitive impairment 09/22/2016  . Morbid obesity ([place]) 06/30/2015  . Diabetes ([place]) 04/23/2015  . PCP NOTES >>>>> 12/28/2014  . Leg ulcer, left ([place]) 10/12/2013  . Annual physical exam 12/14/2011  . Angioedema 10/22/2011  . Tracheobronchitis, chronic ([place]) 08/20/2010  . Fatigue 05/22/2010  . CKD (chronic kidney disease) 04/16/2010  . GERD 09/30/2008  . EDEMA 06/10/2008  . Hyperlipidemia 01/25/2007  . ANKLE PAIN, CHRONIC 12/08/2006  . Seasonal and  perennial allergic rhinitis 10/20/2006  . HTN (hypertension) 08/30/2006  . OSTEOARTHROSIS, GENERALIZED, MULTIPLE SITES 08/30/2006    Past Surgical History:  Procedure Laterality Date  . CATARACT EXTRACTION    . CHOLECYSTECTOMY    . dental implants    . PARTIAL HYSTERECTOMY     in her 87s    OB History    No data available       Home Medications    Prior to Admission medications   Medication Sig Start Date End Date Taking? Authorizing Provider  amLODipine (NORVASC) 5 MG tablet Take 1 tablet (5 mg total) by mouth daily. 09/21/16   Colon Branch, MD  aspirin 81 MG tablet Take 81 mg by mouth daily.      [provider]  atorvastatin (LIPITOR) 10 MG tablet Take 1 tablet (10 mg total) by mouth daily. 03/22/17   Colon Branch, MD  azelastine (ASTELIN) 0.1 % nasal spray Place 2 sprays into both nostrils at bedtime as needed for rhinitis. Use in each nostril as directed 09/21/16   Colon Branch, MD  cromolyn (NASALCROM) 5.2 MG/ACT nasal spray Place 1 spray into both nostrils 4 (four) times daily. 09/21/16   Colon Branch, MD  Cyanocobalamin (B-12 PO) Take 1 tablet by mouth daily.     [provider]  donepezil (ARICEPT) 10 MG tablet Take 1 tablet (10 mg total) by mouth at bedtime. 12/22/16   [name],  Alda Berthold, MD  EPINEPHrine 0.3 mg/0.3 mL IJ SOAJ injection Inject 0.3 mg into the muscle once. Reported on 06/30/2015    [provider]  Exenatide ER (BYDUREON BCISE) 2 MG/0.85ML AUIJ Inject 2 mg into the skin once a week. 02/01/17   Renato Shin, MD  fluticasone (FLONASE) 50 MCG/ACT nasal spray USE 1 TO 2 SPRAYS IN EACH NOSTRIL DAILY 06/13/14   Baird Lyons D, MD  fluticasone (FLOVENT HFA) 44 MCG/ACT inhaler Inhale 2 puffs into the lungs 2 (two) times daily. 07/19/13   Deneise Lever, MD  furosemide (LASIX) 20 MG tablet Take 2 tablets (40 mg total) by mouth daily. 09/21/16   Colon Branch, MD  Insulin Lispro Prot & Lispro (HUMALOG MIX 75/25 KWIKPEN) (75-25) 100 UNIT/ML Kwikpen Inject  65 Units into the skin daily with breakfast. 10/04/16   Renato Shin, MD  loratadine (CLARITIN) 10 MG tablet Take 1 tablet (10 mg total) by mouth daily. 09/21/16   Colon Branch, MD  losartan (COZAAR) 50 MG tablet Take 1 tablet (50 mg total) by mouth daily. 03/22/17   Colon Branch, MD  Multiple Vitamins-Minerals (CENTRUM SILVER PO) Take 1 tablet by mouth daily.      [provider]  omeprazole (PRILOSEC) 20 MG capsule Take 1 capsule (20 mg total) by mouth daily. 09/21/16   Colon Branch, MD  Polyethyl Glycol-Propyl Glycol (SYSTANE) 0.4-0.3 % SOLN Apply 1 drop to eye daily as needed (for dry eyes).    [provider]  SURE COMFORT PEN NEEDLES 31G X 5 MM MISC Use to inject insulin 1 time per day. 10/04/16   Renato Shin, MD    Family History Family History  Problem Relation Age of Onset  . Healthy Brother   . Healthy Son   . Healthy Son   . Heart attack Neg Hx   . Colon cancer Neg Hx   . Breast cancer Neg Hx   . Stroke Neg Hx     Social History Social History   Tobacco Use  . Smoking status: Never Smoker  . Smokeless tobacco: Never Used  Substance Use Topics  . Alcohol use: No    Alcohol/week: 0.0 oz  . Drug use: No     Allergies   Patient has no known allergies.   Review of Systems Review of Systems  Respiratory: Negative for shortness of breath.   Cardiovascular: Negative for chest pain, palpitations and syncope.  Gastrointestinal: Negative for diarrhea, nausea and vomiting.  Neurological: Positive for dizziness. Negative for weakness and headaches.  All other systems reviewed and are negative.    Physical Exam Updated Vital Signs BP 131/69   Pulse 75   Temp 97.9 F (36.6 C) (Oral)   Resp 17   Ht 5\' 4"  (1.626 m)   Wt 99.8 kg (220 lb)   SpO2 96%   BMI 37.76 kg/m   Physical Exam  Constitutional: She is oriented to person, place, and time. She appears well-developed and well-nourished. No distress.  HENT:  Head: Normocephalic and atraumatic.    Mouth/Throat: Oropharynx is clear and moist.  Eyes: Conjunctivae and EOM are normal. Pupils are equal, round, and reactive to light.  Neck: Normal range of motion. Neck supple.  Cardiovascular: Normal rate, regular rhythm and intact distal pulses.  No murmur heard. Pulmonary/Chest: Effort normal and breath sounds normal. No respiratory distress. She has no wheezes. She has no rales.  Abdominal: Soft. She exhibits no distension. There is no tenderness. There is no  rebound and no guarding.  Musculoskeletal: Normal range of motion. She exhibits tenderness. She exhibits no edema.       Left ankle: Normal.       Feet:  Neurological: She is alert and oriented to person, place, and time.  Skin: Skin is warm and dry. Capillary refill takes less than 2 seconds. No rash noted. No erythema.  Psychiatric: She has a normal mood and affect. Her behavior is normal.  Nursing note and vitals reviewed.    ED Treatments / Results  Labs (all labs ordered are listed, but only abnormal results are displayed) Labs Reviewed  BASIC METABOLIC PANEL - Abnormal; Notable for the following components:      Result Value   Glucose, Bld 198 (*)    Creatinine, Ser 1.40 (*)    GFR calc non Af Amer 35 (*)    GFR calc Af Amer 40 (*)    All other components within normal limits  CBC  URINALYSIS, ROUTINE W REFLEX MICROSCOPIC  CBG MONITORING, ED    EKG  EKG Interpretation  Date/Time:  Monday April 04 2017 15:10:26 EST Ventricular Rate:  81 PR Interval:  134 QRS Duration: 82 QT Interval:  394 QTC Calculation: 457 R Axis:   -7 Text Interpretation:  Normal sinus rhythm Left ventricular hypertrophy Nonspecific T wave abnormality No previous tracing Confirmed by Blanchie Dessert [phone]) on 04/04/2017 7:01:40 PM       Radiology Dg Ankle Complete Left  Result Date: 04/04/2017 CLINICAL DATA:  Acute left ankle swelling after injury yesterday. EXAM: LEFT ANKLE COMPLETE - 3+ VIEW COMPARISON:  None. FINDINGS:  There is no evidence of fracture, dislocation, or joint effusion. There is no evidence of arthropathy or other focal bone abnormality. Soft tissues are unremarkable. IMPRESSION: Normal left ankle. Electronically Signed   By: Marijo Conception, M.D.   On: 04/04/2017 15:51   Dg Foot Complete Left  Result Date: 04/04/2017 CLINICAL DATA:  Acute left ankle swelling after injury yesterday. EXAM: LEFT FOOT - COMPLETE 3+ VIEW COMPARISON:  None. FINDINGS: Mildly displaced fracture is seen involving the proximal fifth metatarsal. No other bony abnormality is noted. Minimal posterior calcaneal spurring is noted. Joint spaces are intact. No soft tissue abnormality is noted. IMPRESSION: Mildly displaced proximal fifth metatarsal fracture. Electronically Signed   By: Marijo Conception, M.D.   On: 04/04/2017 15:53    Procedures Procedures (including critical care time)  Medications Ordered in ED Medications - No data to display   Initial Impression / Assessment and Plan / ED Course  I have reviewed the triage vital signs and the nursing notes.  Pertinent labs & imaging results that were available during my care of the patient were reviewed by me and considered in my medical decision making (see chart for details).     Patient presenting today with left foot discoloration and swelling after she had a brief dizzy episode while walking down some steps.  She states she twisted her foot but did not fall.  The dizzy episode was an unusual sensation in her head that lasted 2-3 minutes and then resolved spontaneously.  She has not had any other symptoms.  She has been walking around and had no further problems.  She denied any chest pain, palpitations, nausea or vomiting surrounding this event.  She is asymptomatic now.  Patient's vital signs are reassuring.  EKG without acute findings.  Labs are reassuring.  No evidence of electrolyte abnormality, anemia or new renal disease.  X-ray is  positive for mildly displaced  proximal fifth metatarsal fracture.  Patient placed in a Cam walker and given Ortho follow-up.  Final Clinical Impressions(s) / ED Diagnoses   Final diagnoses:  Dizziness  Displaced fracture of fifth metatarsal bone, left foot, initial encounter for closed fracture    ED Discharge Orders    None       Blanchie Dessert, MD 04/04/17 1933

## 2017-04-04 NOTE — Progress Notes (Signed)
Orthopedic Tech Progress Note Patient Details:  Angel Green [birth year] [id number]  Ortho Devices Type of Ortho Device: CAM walker Ortho Device/Splint Location: Left Ortho Device/Splint Interventions: Application, Adjustment   Post Interventions Patient Tolerated: Well, Ambulated well Instructions Provided: Adjustment of device, Care of device, [place]oper ambulation with device   Kristopher Oppenheim 04/04/2017, 7:45 PM

## 2017-04-04 NOTE — ED Notes (Signed)
Pt stable, ambulatory, states understanding of discharge instructions 

## 2017-04-04 NOTE — ED Notes (Signed)
This EMT requested urine sample from pt. Pt voiced acknowledgment

## 2017-04-04 NOTE — ED Notes (Signed)
Ortho on the way

## 2017-04-05 DIAGNOSIS — J3089 Other allergic rhinitis: Secondary | ICD-10-CM | POA: Diagnosis not present

## 2017-04-08 ENCOUNTER — Encounter: Payer: Self-pay | Admitting: Endocrinology

## 2017-04-08 ENCOUNTER — Ambulatory Visit (INDEPENDENT_AMBULATORY_CARE_PROVIDER_SITE_OTHER): Payer: Medicare Other | Admitting: Endocrinology

## 2017-04-08 VITALS — BP 112/70 | HR 87 | Wt 213.4 lb

## 2017-04-08 DIAGNOSIS — E118 Type 2 diabetes mellitus with unspecified complications: Secondary | ICD-10-CM | POA: Diagnosis not present

## 2017-04-08 LAB — POCT GLYCOSYLATED HEMOGLOBIN (HGB A1C): HEMOGLOBIN A1C: 7.2

## 2017-04-08 NOTE — Progress Notes (Signed)
Subjective:    Patient ID: Angel Green, female    DOB: 1940-07-27, 77 y.o.   MRN: [id number]  HPI Pt returns for f/u of diabetes mellitus: DM type: Insulin-requiring type 2 Dx'ed: 0623 Complications: polyneuropathy, and renal insufficiency.  Therapy: insulin since 2010, and bydureon.  GDM: never.  DKA: never Severe hypoglycemia: never.  Pancreatitis: never.  Other: therapy has been limited by variable cbg's; she changed to QAM premixed insulin, after poor results with multiple daily injections; pattern of cbg's indicates she does not need a PM dose.   Interval history:  no cbg record, but states cbg's are well-controlled.  It is in general higher as the day goes on.  pt states she feels well in general.  Past Medical History:  Diagnosis Date  . Allergy    Dust Mites  . Ankle pain    chronic  . Diabetes mellitus, type 2 ([place])   . Hyperlipidemia   . Hypertension   . Osteoarthritis   . RENAL INSUFFICIENCY, CHRONIC 04/16/2010  . Rhinitis    allergic nos    Past Surgical History:  Procedure Laterality Date  . CATARACT EXTRACTION    . CHOLECYSTECTOMY    . dental implants    . PARTIAL HYSTERECTOMY     in her 60s    Social History   Socioeconomic History  . Marital status: Married    Spouse name: Not on file  . Number of children: 2  . Years of education: Not on file  . Highest education level: Not on file  Social Needs  . Financial resource strain: Not on file  . Food insecurity - worry: Not on file  . Food insecurity - inability: Not on file  . Transportation needs - medical: Not on file  . Transportation needs - non-medical: Not on file  Occupational History  . Occupation:  retired  Tobacco Use  . Smoking status: Never Smoker  . Smokeless tobacco: Never Used  Substance and Sexual Activity  . Alcohol use: No    Alcohol/week: 0.0 oz  . Drug use: No  . Sexual activity: Yes    Partners: Male  Other Topics Concern  . Not on file  Social History Narrative      husband is a Company secretary                Current Outpatient Medications on File Prior to Visit  Medication Sig Dispense Refill  . amLODipine (NORVASC) 5 MG tablet Take 1 tablet (5 mg total) by mouth daily. 90 tablet 1  . aspirin 81 MG tablet Take 81 mg by mouth daily.      Marland Kitchen atorvastatin (LIPITOR) 10 MG tablet Take 1 tablet (10 mg total) by mouth daily. 90 tablet 0  . azelastine (ASTELIN) 0.1 % nasal spray Place 2 sprays into both nostrils at bedtime as needed for rhinitis. Use in each nostril as directed 90 mL 1  . cromolyn (NASALCROM) 5.2 MG/ACT nasal spray Place 1 spray into both nostrils 4 (four) times daily. 39 mL 1  . Cyanocobalamin (B-12 PO) Take 1 tablet by mouth daily.     Marland Kitchen donepezil (ARICEPT) 10 MG tablet Take 1 tablet (10 mg total) by mouth at bedtime. 90 tablet 1  . EPINEPHrine 0.3 mg/0.3 mL IJ SOAJ injection Inject 0.3 mg into the muscle once. Reported on 06/30/2015    . Exenatide ER (BYDUREON BCISE) 2 MG/0.85ML AUIJ Inject 2 mg into the skin once a week. 12 pen 3  . fluticasone (FLONASE)  50 MCG/ACT nasal spray USE 1 TO 2 SPRAYS IN EACH NOSTRIL DAILY 16 g 3  . fluticasone (FLOVENT HFA) 44 MCG/ACT inhaler Inhale 2 puffs into the lungs 2 (two) times daily. 3 Inhaler 3  . furosemide (LASIX) 20 MG tablet Take 2 tablets (40 mg total) by mouth daily. 180 tablet 1  . Insulin Lispro Prot & Lispro (HUMALOG MIX 75/25 KWIKPEN) (75-25) 100 UNIT/ML Kwikpen Inject 65 Units into the skin daily with breakfast. 60 mL 5  . loratadine (CLARITIN) 10 MG tablet Take 1 tablet (10 mg total) by mouth daily. 90 tablet 1  . losartan (COZAAR) 50 MG tablet Take 1 tablet (50 mg total) by mouth daily. 90 tablet 0  . Multiple Vitamins-Minerals (CENTRUM SILVER PO) Take 1 tablet by mouth daily.      Marland Kitchen omeprazole (PRILOSEC) 20 MG capsule Take 1 capsule (20 mg total) by mouth daily. 90 capsule 1  . Polyethyl Glycol-Propyl Glycol (SYSTANE) 0.4-0.3 % SOLN Apply 1 drop to eye daily as needed (for dry eyes).    . SURE  COMFORT PEN NEEDLES 31G X 5 MM MISC Use to inject insulin 1 time per day. 100 each 2   No current facility-administered medications on file prior to visit.     Allergies  Allergen Reactions  . Dust Mite Mixed Allergen Ext [Mite (D. Farinae)]     Family History  Problem Relation Age of Onset  . Healthy Brother   . Healthy Son   . Healthy Son   . Heart attack Neg Hx   . Colon cancer Neg Hx   . Breast cancer Neg Hx   . Stroke Neg Hx     BP 112/70 (BP Location: Right Wrist, Patient Position: Sitting, Cuff Size: Normal)   Pulse 87   Wt 213 lb 6.4 oz (96.8 kg)   SpO2 96%   BMI 36.63 kg/m    Review of Systems She denies hypoglycemia    Objective:   Physical Exam VITAL SIGNS:  See vs page GENERAL: no distress Pulses: right pedis intact bilat.   MSK: no deformity of the right foot CV: trace right leg edema Skin:  no ulcer on the right foot.  normal color and temp on the right foot. Neuro: sensation is intact to touch on the right foot Ext: Left foot is in a boot  Lab Results  Component Value Date   HGBA1C 7.2 04/08/2017      Assessment & Plan:  Insulin-requiring type 2 DM, with polyneuropathy: this is the best control this pt should aim for, given this regimen, which does match insulin to her changing needs throughout the day Renal insuff: in this setting, she does not need a PM dose.   Patient Instructions  Please continue the same medications for diabetes.  check your blood sugar twice a day.  vary the time of day when you check, between before the 3 meals, and at bedtime.  also check if you have symptoms of your blood sugar being too high or too low.  please keep a record of the readings and bring it to your next appointment here (or you can bring the meter itself).  You can write it on any piece of paper.  please call us sooner if your blood sugar goes below 70, or if you have a lot of readings over 200.   Please come back for a follow-up appointment in 4-5 months.

## 2017-04-08 NOTE — Patient Instructions (Addendum)
Please continue the same medications for diabetes.  check your blood sugar twice a day.  vary the time of day when you check, between before the 3 meals, and at bedtime.  also check if you have symptoms of your blood sugar being too high or too low.  please keep a record of the readings and bring it to your next appointment here (or you can bring the meter itself).  You can write it on any piece of paper.  please call us sooner if your blood sugar goes below 70, or if you have a lot of readings over 200.   Please come back for a follow-up appointment in 4-5 months.

## 2017-04-13 DIAGNOSIS — M25561 Pain in right knee: Secondary | ICD-10-CM | POA: Diagnosis not present

## 2017-04-13 DIAGNOSIS — M25572 Pain in left ankle and joints of left foot: Secondary | ICD-10-CM | POA: Diagnosis not present

## 2017-04-13 DIAGNOSIS — M25571 Pain in right ankle and joints of right foot: Secondary | ICD-10-CM | POA: Diagnosis not present

## 2017-04-19 DIAGNOSIS — J3089 Other allergic rhinitis: Secondary | ICD-10-CM | POA: Diagnosis not present

## 2017-04-22 ENCOUNTER — Encounter: Payer: Medicare Other | Admitting: Internal Medicine

## 2017-05-02 DIAGNOSIS — J3089 Other allergic rhinitis: Secondary | ICD-10-CM | POA: Diagnosis not present

## 2017-05-05 ENCOUNTER — Encounter: Payer: Self-pay | Admitting: Internal Medicine

## 2017-05-05 ENCOUNTER — Ambulatory Visit (INDEPENDENT_AMBULATORY_CARE_PROVIDER_SITE_OTHER): Payer: Medicare Other | Admitting: Internal Medicine

## 2017-05-05 VITALS — BP 142/84 | HR 79 | Temp 97.6°F | Resp 14 | Ht 65.0 in | Wt 210.5 lb

## 2017-05-05 DIAGNOSIS — I10 Essential (primary) hypertension: Secondary | ICD-10-CM | POA: Diagnosis not present

## 2017-05-05 DIAGNOSIS — E118 Type 2 diabetes mellitus with unspecified complications: Secondary | ICD-10-CM | POA: Diagnosis not present

## 2017-05-05 MED ORDER — LOSARTAN POTASSIUM 50 MG PO TABS: 50.0000 mg | ORAL_TABLET | Freq: Every day | ORAL | 0 refills | Status: DC

## 2017-05-05 NOTE — Assessment & Plan Note (Signed)
DM: Per Dr. Loanne Drilling, last A1c 7.2, CBGs between 70 and 100.  She denies symptoms of low CBGs such as sweats, fatigue.  Did have a episode of imbalance but that has not recurred. HTN: Currently on amlodipine, Lasix, has not taking losartan due to a recall for the last week.  Rec to go back on losartan, prescription sent to our pharmacy. CKD: Creatinine was checked at the ER, stable. Left foot fracture: Under the care of Ortho. RTC, CPX, 4 months

## 2017-05-05 NOTE — Progress Notes (Signed)
Subjective:    Patient ID: Angel Green, female    DOB: 25-Jul-1940, 77 y.o.   MRN: [id number]  DOS:  05/05/2017 Type of visit - description : f/u Interval history: Since the last visit, went to the ER, after a brief episode of imbalance, she fractured her left foot.  Under the care of Ortho. Saw Endocrinology, no changes were done. HTN: Good compliance with medication except losartan which was stopped a week ago due to a recall.   Review of Systems  Denies chest pain or difficulty breathing. No dysuria or gross hematuria No nausea, vomiting, BMs normal. No recent GERD symptoms  Past Medical History:  Diagnosis Date  . Allergy    Dust Mites  . Ankle pain    chronic  . Diabetes mellitus, type 2 ([place])   . Hyperlipidemia   . Hypertension   . Osteoarthritis   . RENAL INSUFFICIENCY, CHRONIC 04/16/2010  . Rhinitis    allergic nos    Past Surgical History:  Procedure Laterality Date  . CATARACT EXTRACTION    . CHOLECYSTECTOMY    . dental implants    . PARTIAL HYSTERECTOMY     in her 58s    Social History   Socioeconomic History  . Marital status: Married    Spouse name: Not on file  . Number of children: 2  . Years of education: Not on file  . Highest education level: Not on file  Social Needs  . Financial resource strain: Not on file  . Food insecurity - worry: Not on file  . Food insecurity - inability: Not on file  . Transportation needs - medical: Not on file  . Transportation needs - non-medical: Not on file  Occupational History  . Occupation:  retired  Tobacco Use  . Smoking status: Never Smoker  . Smokeless tobacco: Never Used  Substance and Sexual Activity  . Alcohol use: No    Alcohol/week: 0.0 oz  . Drug use: No  . Sexual activity: Yes    Partners: Male  Other Topics Concern  . Not on file  Social History Narrative    husband is a Company secretary                  Allergies as of 05/05/2017      Reactions   Dust Mite Mixed Allergen Ext  [mite (d. Farinae)]       Medication List        Accurate as of 05/05/17  9:50 AM. Always use your most recent med list.          amLODipine 5 MG tablet Commonly known as:  NORVASC Take 1 tablet (5 mg total) by mouth daily.   aspirin 81 MG tablet Take 81 mg by mouth daily.   atorvastatin 10 MG tablet Commonly known as:  LIPITOR Take 1 tablet (10 mg total) by mouth daily.   azelastine 0.1 % nasal spray Commonly known as:  ASTELIN Place 2 sprays into both nostrils at bedtime as needed for rhinitis. Use in each nostril as directed   B-12 PO Take 1 tablet by mouth daily.   CENTRUM SILVER PO Take 1 tablet by mouth daily.   cromolyn 5.2 MG/ACT nasal spray Commonly known as:  NASALCROM Place 1 spray into both nostrils 4 (four) times daily.   donepezil 10 MG tablet Commonly known as:  ARICEPT Take 1 tablet (10 mg total) by mouth at bedtime.   EPINEPHrine 0.3 mg/0.3 mL Soaj injection Commonly known as:  EPI-PEN Inject 0.3 mg into the muscle once. Reported on 06/30/2015   Exenatide ER 2 MG/0.85ML Auij Commonly known as:  BYDUREON BCISE Inject 2 mg into the skin once a week.   fluticasone 44 MCG/ACT inhaler Commonly known as:  FLOVENT HFA Inhale 2 puffs into the lungs 2 (two) times daily.   fluticasone 50 MCG/ACT nasal spray Commonly known as:  FLONASE USE 1 TO 2 SPRAYS IN EACH NOSTRIL DAILY   furosemide 20 MG tablet Commonly known as:  LASIX Take 2 tablets (40 mg total) by mouth daily.   Insulin Lispro Prot & Lispro (75-25) 100 UNIT/ML Kwikpen Commonly known as:  HUMALOG MIX 75/25 KWIKPEN Inject 65 Units into the skin daily with breakfast.   loratadine 10 MG tablet Commonly known as:  CLARITIN Take 1 tablet (10 mg total) by mouth daily.   losartan 50 MG tablet Commonly known as:  COZAAR Take 1 tablet (50 mg total) by mouth daily.   omeprazole 20 MG capsule Commonly known as:  PRILOSEC Take 1 capsule (20 mg total) by mouth daily.   SURE COMFORT PEN  NEEDLES 31G X 5 MM Misc Generic drug:  Insulin Pen Needle Use to inject insulin 1 time per day.   SYSTANE 0.4-0.3 % Soln Generic drug:  Polyethyl Glycol-Propyl Glycol Apply 1 drop to eye daily as needed (for dry eyes).          Objective:   Physical Exam BP (!) 142/84 (BP Location: Left Arm, Patient Position: Sitting, Cuff Size: Normal)   Pulse 79   Temp 97.6 F (36.4 C) (Oral)   Resp 14   Ht 5\' 5"  (1.651 m)   Wt 210 lb 8 oz (95.5 kg)   SpO2 97%   BMI 35.03 kg/m  General:   Well developed, well nourished . NAD.  HEENT:  Normocephalic . Face symmetric, atraumatic Lungs:  CTA B Normal respiratory effort, no intercostal retractions, no accessory muscle use. Heart: RRR,  no murmur.  No pretibial edema bilaterally  Skin: Not pale. Not jaundice Neurologic:  alert & oriented X3.  Speech normal, gait limited by recent left foot fracture, using a boot and a walker Psych--  Cognition and judgment appear intact.  Cooperative with normal attention span and concentration.  Behavior appropriate. No anxious or depressed appearing.      Assessment & Plan:   Assessment   DM  Dr Loanne Drilling + Neuropathy: Per foot exam 12-2014 HTN ---change ACE to ARB is 03/2014 Hyperlipidemia CRI  dx 2012  Sees Dr Arty Baumgartner  Morbid obesity DJD, had  chronic ankle pain Allergies -- dust mites , occ uses a inhaler , sees allergist, has shots q weeks started ~ 08-2014  PLAN: DM: Per Dr. Loanne Drilling, last A1c 7.2, CBGs between 70 and 100.  She denies symptoms of low CBGs such as sweats, fatigue.  Did have a episode of imbalance but that has not recurred. HTN: Currently on amlodipine, Lasix, has not taking losartan due to a recall for the last week.  Rec to go back on losartan, prescription sent to our pharmacy. CKD: Creatinine was checked at the ER, stable. Left foot fracture: Under the care of Ortho. RTC, CPX, 4 months

## 2017-05-05 NOTE — Progress Notes (Signed)
Pre visit review using our clinic review tool, if applicable. No additional management support is needed unless otherwise documented below in the visit note. 

## 2017-05-05 NOTE — Patient Instructions (Signed)
   GO TO THE FRONT DESK Schedule your next appointment for a physical exam in 4 months  Go back on losartan, we sent a prescription downstairs   Check the  blood pressure 2 or 3 times a  week  Be sure your blood pressure is between 110/65 and  135/85. If it is consistently higher or lower, let me know

## 2017-05-18 DIAGNOSIS — J3089 Other allergic rhinitis: Secondary | ICD-10-CM | POA: Diagnosis not present

## 2017-05-20 DIAGNOSIS — M25572 Pain in left ankle and joints of left foot: Secondary | ICD-10-CM | POA: Diagnosis not present

## 2017-05-23 ENCOUNTER — Other Ambulatory Visit: Payer: Self-pay | Admitting: Endocrinology

## 2017-05-23 ENCOUNTER — Other Ambulatory Visit: Payer: Self-pay

## 2017-05-23 ENCOUNTER — Telehealth: Payer: Self-pay | Admitting: Endocrinology

## 2017-05-23 MED ORDER — EXENATIDE ER 2 MG/0.85ML ~~LOC~~ AUIJ: 2.0000 mg | AUTO-INJECTOR | SUBCUTANEOUS | 3 refills | Status: DC

## 2017-05-23 NOTE — Telephone Encounter (Signed)
Patient need refill of  The Exenatide ER (BYDUREON BCISE) 2 MG/0.85ML [facility] [[id number]]   [place], [place]

## 2017-05-23 NOTE — Telephone Encounter (Signed)
I have sent to patient;'s pharmacy.  

## 2017-05-25 ENCOUNTER — Telehealth: Payer: Self-pay | Admitting: Endocrinology

## 2017-05-25 NOTE — Telephone Encounter (Signed)
Patient needs RX for Bordum sent to [facility] asap-patient out of medication, however patient would like to try Trulicity (that is what her husband takes). Please call patient at ph# [phone] to discuss option of Trulicity

## 2017-05-26 ENCOUNTER — Other Ambulatory Visit: Payer: Self-pay | Admitting: Endocrinology

## 2017-05-26 ENCOUNTER — Other Ambulatory Visit: Payer: Self-pay

## 2017-05-26 MED ORDER — DULAGLUTIDE 1.5 MG/0.5ML ~~LOC~~ SOAJ: 1.5000 mg | SUBCUTANEOUS | 3 refills | Status: DC

## 2017-05-26 NOTE — Telephone Encounter (Signed)
Ok, I have sent a prescription to your pharmacy 

## 2017-05-26 NOTE — Telephone Encounter (Signed)
I called LVM that prescription was sen in for trulicity.

## 2017-05-30 ENCOUNTER — Telehealth: Payer: Self-pay | Admitting: Endocrinology

## 2017-05-30 DIAGNOSIS — J3089 Other allergic rhinitis: Secondary | ICD-10-CM | POA: Diagnosis not present

## 2017-05-30 NOTE — Telephone Encounter (Signed)
Needs Rx for Trulicity sent to [facility]-asap-[facility] needs PA for Trulicty-call Ph# [phone] to give PA

## 2017-05-30 NOTE — Telephone Encounter (Signed)
Patient stated they received a message that [facility] and they could not fill the prescription.  Patient is unsure what [facility] and would like to know what prescription this could be for.  Please advise

## 2017-05-30 NOTE — Telephone Encounter (Signed)
I have spoken with patient as well as patient's husband. They need a PA done for Trulicity. I have printed off info to do PA. Patient's husband stated she had enough to last a few more weeks.

## 2017-05-31 DIAGNOSIS — J3089 Other allergic rhinitis: Secondary | ICD-10-CM | POA: Diagnosis not present

## 2017-06-14 DIAGNOSIS — J3089 Other allergic rhinitis: Secondary | ICD-10-CM | POA: Diagnosis not present

## 2017-06-15 DIAGNOSIS — M25572 Pain in left ankle and joints of left foot: Secondary | ICD-10-CM | POA: Diagnosis not present

## 2017-06-16 ENCOUNTER — Other Ambulatory Visit: Payer: Self-pay

## 2017-06-16 ENCOUNTER — Telehealth: Payer: Self-pay | Admitting: Endocrinology

## 2017-06-16 MED ORDER — DULAGLUTIDE 1.5 MG/0.5ML ~~LOC~~ SOAJ: 1.5000 mg | SUBCUTANEOUS | 3 refills | Status: DC

## 2017-06-16 NOTE — Telephone Encounter (Signed)
Patient stated he spoke with someone in our office and they stated the PA was done.  I stated I didn't see where it was noted that it was done. But I seen the last note that was sent back from this. Which is listed below.  Please advise   Needs Rx for Trulicity sent to [facility]-asap-[facility] needs PA for Trulicty-call Ph# [phone] to give PA

## 2017-06-16 NOTE — Telephone Encounter (Signed)
I spoke with patient & let her know that her insurance didn't require a PA when I submitted it initially to [employer]. I told her that I had sent it in to [facility] for her & they had confirmed receipt.

## 2017-07-13 DIAGNOSIS — J3089 Other allergic rhinitis: Secondary | ICD-10-CM | POA: Diagnosis not present

## 2017-07-13 DIAGNOSIS — K219 Gastro-esophageal reflux disease without esophagitis: Secondary | ICD-10-CM | POA: Diagnosis not present

## 2017-07-13 DIAGNOSIS — R05 Cough: Secondary | ICD-10-CM | POA: Diagnosis not present

## 2017-07-13 DIAGNOSIS — J301 Allergic rhinitis due to pollen: Secondary | ICD-10-CM | POA: Diagnosis not present

## 2017-08-06 DIAGNOSIS — E11649 Type 2 diabetes mellitus with hypoglycemia without coma: Secondary | ICD-10-CM | POA: Diagnosis not present

## 2017-08-06 DIAGNOSIS — E162 Hypoglycemia, unspecified: Secondary | ICD-10-CM | POA: Diagnosis not present

## 2017-08-06 DIAGNOSIS — Z9989 Dependence on other enabling machines and devices: Secondary | ICD-10-CM | POA: Diagnosis not present

## 2017-08-08 ENCOUNTER — Telehealth: Payer: Self-pay | Admitting: Internal Medicine

## 2017-08-08 NOTE — Telephone Encounter (Signed)
Copied from [place] [phone]. Topic: Quick Communication - See Telephone Encounter >> Aug 08, 2017 10:20 AM Rosalin Hawking wrote: CRM for notification. See Telephone encounter for: 08/08/17.   Pt's spouse stated needed to inform provider that pt is having early stage of arzheimer. Pt has a HFU on Thursday 08-11-2017 and wanted to inform provider ahead of appt.

## 2017-08-08 NOTE — Telephone Encounter (Signed)
Just an FYI

## 2017-08-08 NOTE — Telephone Encounter (Signed)
Thx ,noted

## 2017-08-09 DIAGNOSIS — J3089 Other allergic rhinitis: Secondary | ICD-10-CM | POA: Diagnosis not present

## 2017-08-11 ENCOUNTER — Telehealth: Payer: Self-pay

## 2017-08-11 ENCOUNTER — Ambulatory Visit (INDEPENDENT_AMBULATORY_CARE_PROVIDER_SITE_OTHER): Payer: Medicare Other | Admitting: Internal Medicine

## 2017-08-11 ENCOUNTER — Encounter: Payer: Self-pay | Admitting: Internal Medicine

## 2017-08-11 VITALS — BP 134/82 | HR 91 | Temp 97.8°F | Resp 16 | Ht 65.0 in | Wt 220.1 lb

## 2017-08-11 DIAGNOSIS — F0391 Unspecified dementia with behavioral disturbance: Secondary | ICD-10-CM

## 2017-08-11 DIAGNOSIS — E118 Type 2 diabetes mellitus with unspecified complications: Secondary | ICD-10-CM

## 2017-08-11 DIAGNOSIS — G309 Alzheimer's disease, unspecified: Secondary | ICD-10-CM

## 2017-08-11 MED ORDER — ALPRAZOLAM 0.5 MG PO TABS: 0.5000 mg | ORAL_TABLET | Freq: Every day | ORAL | 0 refills | Status: DC | PRN

## 2017-08-11 MED ORDER — MEMANTINE HCL 5 MG PO TABS: ORAL_TABLET | ORAL | 1 refills | Status: DC

## 2017-08-11 NOTE — Progress Notes (Signed)
Subjective:    Patient ID: Angel Green, female    DOB: 05-03-1940, 77 y.o.   MRN: [id number]  DOS:  08/11/2017 Type of visit - description : ED follow-up Interval history: Patient was visiting [place] and ended up in the emergency room 08/06/2017: She went without eating several hours after she took her insulin.  She become confused and lethargic.  Was difficult to arouse. EMS found her with a sugar of 32, she was given glucose IV and p.o. and ate something, mental status improved. Vital signs were stable although diastolic BP was 502. At the ER she was alert and oriented. Urinalysis negative, CBG increased to 139, troponin negative, CBC normal, potassium 3.4, creatinine 1.2, LFTs normal   Review of Systems Since she left the hospital she is doing well.  Reports good compliance with medication although she could not tell me how many units of insulin she takes.  Past Medical History:  Diagnosis Date  . Allergy    Dust Mites  . Ankle pain    chronic  . Diabetes mellitus, type 2 ([place])   . Hyperlipidemia   . Hypertension   . Osteoarthritis   . RENAL INSUFFICIENCY, CHRONIC 04/16/2010  . Rhinitis    allergic nos    Past Surgical History:  Procedure Laterality Date  . CATARACT EXTRACTION    . CHOLECYSTECTOMY    . dental implants    . PARTIAL HYSTERECTOMY     in her 67s    Social History   Socioeconomic History  . Marital status: Married    Spouse name: Not on file  . Number of children: 2  . Years of education: Not on file  . Highest education level: Not on file  Occupational History  . Occupation:  retired  Scientific laboratory technician  . Financial resource strain: Not on file  . Food insecurity:    Worry: Not on file    Inability: Not on file  . Transportation needs:    Medical: Not on file    Non-medical: Not on file  Tobacco Use  . Smoking status: Never Smoker  . Smokeless tobacco: Never Used  Substance and Sexual Activity  . Alcohol use: No    Alcohol/week: 0.0 oz    . Drug use: No  . Sexual activity: Yes    Partners: Male  Lifestyle  . Physical activity:    Days per week: Not on file    Minutes per session: Not on file  . Stress: Not on file  Relationships  . Social connections:    Talks on phone: Not on file    Gets together: Not on file    Attends religious service: Not on file    Active member of club or organization: Not on file    Attends meetings of clubs or organizations: Not on file    Relationship status: Not on file  . Intimate partner violence:    Fear of current or ex partner: Not on file    Emotionally abused: Not on file    Physically abused: Not on file    Forced sexual activity: Not on file  Other Topics Concern  . Not on file  Social History Narrative    husband is a Company secretary                  Allergies as of 08/11/2017      Reactions   Dust Mite Mixed Allergen Ext [mite (d. Farinae)]       Medication List  Accurate as of 08/11/17 11:59 PM. Always use your most recent med list.          ALPRAZolam 0.5 MG tablet Commonly known as:  XANAX Take 1 tablet (0.5 mg total) by mouth daily as needed for anxiety. To be use before a MRI   amLODipine 5 MG tablet Commonly known as:  NORVASC Take 1 tablet (5 mg total) by mouth daily.   aspirin 81 MG tablet Take 81 mg by mouth daily.   atorvastatin 10 MG tablet Commonly known as:  LIPITOR Take 1 tablet (10 mg total) by mouth daily.   azelastine 0.1 % nasal spray Commonly known as:  ASTELIN Place 2 sprays into both nostrils at bedtime as needed for rhinitis. Use in each nostril as directed   B-12 PO Take 1 tablet by mouth daily.   CENTRUM SILVER PO Take 1 tablet by mouth daily.   cromolyn 5.2 MG/ACT nasal spray Commonly known as:  NASALCROM Place 1 spray into both nostrils 4 (four) times daily.   donepezil 10 MG tablet Commonly known as:  ARICEPT Take 1 tablet (10 mg total) by mouth at bedtime.   Dulaglutide 1.5 MG/0.5ML Sopn Commonly known as:   TRULICITY Inject 1.5 mg into the skin once a week.   EPINEPHrine 0.3 mg/0.3 mL Soaj injection Commonly known as:  EPI-PEN Inject 0.3 mg into the muscle once. Reported on 06/30/2015   fluticasone 44 MCG/ACT inhaler Commonly known as:  FLOVENT HFA Inhale 2 puffs into the lungs 2 (two) times daily.   fluticasone 50 MCG/ACT nasal spray Commonly known as:  FLONASE USE 1 TO 2 SPRAYS IN EACH NOSTRIL DAILY   furosemide 20 MG tablet Commonly known as:  LASIX Take 2 tablets (40 mg total) by mouth daily.   Insulin Lispro Prot & Lispro (75-25) 100 UNIT/ML Kwikpen Commonly known as:  HUMALOG MIX 75/25 KWIKPEN Inject 65 Units into the skin daily with breakfast.   loratadine 10 MG tablet Commonly known as:  CLARITIN Take 1 tablet (10 mg total) by mouth daily.   losartan 50 MG tablet Commonly known as:  COZAAR Take 1 tablet (50 mg total) by mouth daily.   memantine 5 MG tablet Commonly known as:  NAMENDA Take 1 tablet by mouth daily for 7 days, then 1 tablet by mouth twice daily   omeprazole 20 MG capsule Commonly known as:  PRILOSEC Take 1 capsule (20 mg total) by mouth daily.   SURE COMFORT PEN NEEDLES 31G X 5 MM Misc Generic drug:  Insulin Pen Needle Use to inject insulin 1 time per day.   SYSTANE 0.4-0.3 % Soln Generic drug:  Polyethyl Glycol-Propyl Glycol Apply 1 drop to eye daily as needed (for dry eyes).          Objective:   Physical Exam BP 134/82 (BP Location: Right Arm, Patient Position: Sitting, Cuff Size: Normal)   Pulse 91   Temp 97.8 F (36.6 C) (Oral)   Resp 16   Ht 5\' 5"  (1.651 m)   Wt 220 lb 2 oz (99.8 kg)   SpO2 97%   BMI 36.63 kg/m  General:   Well developed, NAD, see BMI.  HEENT:  Normocephalic . Face symmetric, atraumatic Neurologic:  alert, oriented to self, otherwise not oriented.  Speech normal, gait appropriate for age and unassisted MMSE: 78 Psych--  Cooperative with normal attention span and concentration.  No anxious or depressed  appearing.      Assessment & Plan:   Assessment   DM  Dr Loanne Drilling + Neuropathy: Per foot exam 12-2014 HTN ---change ACE to ARB is 03/2014 Hyperlipidemia CRI  dx 2012  Sees Dr Arty Baumgartner  Morbid obesity DJD, had  chronic ankle pain Allergies -- dust mites , occ uses a inhaler , sees allergist, has shots q weeks started ~ 08-2014 Dementia:  MMSE 7/218: 24, rx Aricept.  B12, RPR, sed rate and TSH normal MMSE 07/2017 :13  PLAN: DM: Recently had a episode of severe hypoglycemia, went to the ER, see HPI, no problems since then.  Reports good compliance with medication however she could not tell how many units of insulin she takes.  I strongly recommend the husband to "take over" her medications and he said he is willing and able to do so.  The patient is reluctant to let him do that.  We had a long conversation about it.  Encouraged to continue checking CBGs and follow-up with endocrinology as recommended Dementia w/ behavioral disturbance: The pt's husband reports her memory is a lot worse, I did a MMSE today and it is confirmatory.  Score dropped from 24 to 13.  That is a sharp drop in a matter of a year, this was carefully discussed with the patient has been.  He understand the situation perfectly, patient does not.  . Plan: Add Namenda, watch for side effects, brain MRI, neuro referral.  Stop driving, patient is quite reluctant.  Risks discussed with her and her husband. RTC 3 months  Today, I spent more than 40   min with the patient: >50% of the time counseling the pt and husband about: definitely worsening of memory, need to let family manage her meds, risk of driving, also reviewing the chart and recent ER visit

## 2017-08-11 NOTE — Telephone Encounter (Signed)
Pt's husband is requesting something for claustrophobia- MRI scheduled 08/13/2017.

## 2017-08-11 NOTE — Telephone Encounter (Signed)
Advise him I sent to prescription for Xanax, to take 1 tablet before the MRI.  I sent 3 tablets in case she has another procedure ; warn him it might make her very sleepy, please be careful.

## 2017-08-11 NOTE — Progress Notes (Signed)
Pre visit review using our clinic review tool, if applicable. No additional management support is needed unless otherwise documented below in the visit note. 

## 2017-08-11 NOTE — Telephone Encounter (Signed)
Spoke w/ [name], Pt's spouse informed Xanax has been sent- informed of instructions on how to take medication and warned that medication can make her drowsy. [name] verbalized understanding.

## 2017-08-11 NOTE — Patient Instructions (Addendum)
Please come back in 3 months  Continue the same medications including insulin 75/25: 65 units daily  Start Namenda 5 mg: 1 tablet daily for 1 week Then 1 tablet twice a day  We will schedule a MRI of the brain  We will refer you to neurology  Diabetes: Check your blood sugar    3 times a day  at different times of the day.  Keep a log. GOALS: Fasting before a meal 100 - 130 2 hours after a meal less than 180 Call if consistently not at goal

## 2017-08-13 ENCOUNTER — Ambulatory Visit (HOSPITAL_BASED_OUTPATIENT_CLINIC_OR_DEPARTMENT_OTHER)
Admission: RE | Admit: 2017-08-13 | Discharge: 2017-08-13 | Disposition: A | Discharge status: Home / Self Care | Payer: Medicare Other | Source: Ambulatory Visit | Attending: Internal Medicine | Admitting: Internal Medicine

## 2017-08-13 DIAGNOSIS — F0280 Dementia in other diseases classified elsewhere without behavioral disturbance: Secondary | ICD-10-CM | POA: Diagnosis not present

## 2017-08-13 DIAGNOSIS — R410 Disorientation, unspecified: Secondary | ICD-10-CM | POA: Diagnosis not present

## 2017-08-13 DIAGNOSIS — G309 Alzheimer's disease, unspecified: Secondary | ICD-10-CM | POA: Insufficient documentation

## 2017-08-13 DIAGNOSIS — R413 Other amnesia: Secondary | ICD-10-CM | POA: Diagnosis not present

## 2017-08-14 DIAGNOSIS — F0390 Unspecified dementia without behavioral disturbance: Secondary | ICD-10-CM | POA: Insufficient documentation

## 2017-08-14 NOTE — Assessment & Plan Note (Addendum)
DM: Recently had a episode of severe hypoglycemia, went to the ER, see HPI, no problems since then.  Reports good compliance with medication however she could not tell how many units of insulin she takes.  I strongly recommend the husband to "take over" her medications and he said he is willing and able to do so.  The patient is reluctant to let him do that.  We had a long conversation about it.  Encouraged to continue checking CBGs and follow-up with endocrinology as recommended Dementia w/ behavioral disturbance:  The pt's husband reports her memory is a lot worse, I did a MMSE today and it is confirmatory.  Score dropped from 24 to 13.  That is a sharp drop in a matter of a year, this was carefully discussed with the patient has been.  He understand the situation perfectly, patient does not.  . Plan: Add Namenda, watch for side effects, brain MRI, neuro referral.  Stop driving, patient is quite reluctant.  Risks discussed with her and her husband. RTC 3 months

## 2017-08-15 ENCOUNTER — Ambulatory Visit (INDEPENDENT_AMBULATORY_CARE_PROVIDER_SITE_OTHER): Payer: Medicare Other | Admitting: Endocrinology

## 2017-08-15 ENCOUNTER — Encounter: Payer: Self-pay | Admitting: Endocrinology

## 2017-08-15 VITALS — BP 132/82 | HR 95 | Wt 224.4 lb

## 2017-08-15 DIAGNOSIS — E118 Type 2 diabetes mellitus with unspecified complications: Secondary | ICD-10-CM

## 2017-08-15 LAB — POCT GLYCOSYLATED HEMOGLOBIN (HGB A1C): HEMOGLOBIN A1C: 6.1 % — AB (ref 4.0–5.6)

## 2017-08-15 MED ORDER — INSULIN LISPRO PROT & LISPRO (75-25 MIX) 100 UNIT/ML KWIKPEN: 50.0000 [IU] | PEN_INJECTOR | Freq: Every day | SUBCUTANEOUS | 5 refills | Status: DC

## 2017-08-15 NOTE — Progress Notes (Signed)
Subjective:    Patient ID: Angel Green, female    DOB: 21-Nov-1940, 77 y.o.   MRN: [id number]  HPI Pt returns for f/u of diabetes mellitus: DM type: Insulin-requiring type 2 Dx'ed: 6283 Complications: polyneuropathy, and renal insufficiency.  Therapy: insulin since 2010, and bydureon.  GDM: never.  DKA: never Severe hypoglycemia: once, in 2019 Pancreatitis: never.  Other: therapy has been limited by variable cbg's; she changed to QAM premixed insulin, after poor results with multiple daily injections; pattern of cbg's indicates she does not need a PM dose.   Interval history:  Pt was recently seen in ER in [place], [place], with severe hypoglycemia.  This happened after a very light lunch.   Past Medical History:  Diagnosis Date  . Allergy    Dust Mites  . Ankle pain    chronic  . Diabetes mellitus, type 2 ([place])   . Hyperlipidemia   . Hypertension   . Osteoarthritis   . RENAL INSUFFICIENCY, CHRONIC 04/16/2010  . Rhinitis    allergic nos    Past Surgical History:  Procedure Laterality Date  . CATARACT EXTRACTION    . CHOLECYSTECTOMY    . dental implants    . PARTIAL HYSTERECTOMY     in her 80s    Social History   Socioeconomic History  . Marital status: Married    Spouse name: Not on file  . Number of children: 2  . Years of education: Not on file  . Highest education level: Not on file  Occupational History  . Occupation:  retired  Scientific laboratory technician  . Financial resource strain: Not on file  . Food insecurity:    Worry: Not on file    Inability: Not on file  . Transportation needs:    Medical: Not on file    Non-medical: Not on file  Tobacco Use  . Smoking status: Never Smoker  . Smokeless tobacco: Never Used  Substance and Sexual Activity  . Alcohol use: No    Alcohol/week: 0.0 oz  . Drug use: No  . Sexual activity: Yes    Partners: Male  Lifestyle  . Physical activity:    Days per week: Not on file    Minutes per session: Not on file  . Stress: Not  on file  Relationships  . Social connections:    Talks on phone: Not on file    Gets together: Not on file    Attends religious service: Not on file    Active member of club or organization: Not on file    Attends meetings of clubs or organizations: Not on file    Relationship status: Not on file  . Intimate partner violence:    Fear of current or ex partner: Not on file    Emotionally abused: Not on file    Physically abused: Not on file    Forced sexual activity: Not on file  Other Topics Concern  . Not on file  Social History Narrative    husband is a Company secretary                Current Outpatient Medications on File Prior to Visit  Medication Sig Dispense Refill  . ALPRAZolam (XANAX) 0.5 MG tablet Take 1 tablet (0.5 mg total) by mouth daily as needed for anxiety. To be use before a MRI 3 tablet 0  . amLODipine (NORVASC) 5 MG tablet Take 1 tablet (5 mg total) by mouth daily. 90 tablet 1  . aspirin 81 MG tablet  Take 81 mg by mouth daily.      Marland Kitchen atorvastatin (LIPITOR) 10 MG tablet Take 1 tablet (10 mg total) by mouth daily. 90 tablet 0  . azelastine (ASTELIN) 0.1 % nasal spray Place 2 sprays into both nostrils at bedtime as needed for rhinitis. Use in each nostril as directed 90 mL 1  . cromolyn (NASALCROM) 5.2 MG/ACT nasal spray Place 1 spray into both nostrils 4 (four) times daily. 39 mL 1  . Cyanocobalamin (B-12 PO) Take 1 tablet by mouth daily.     Marland Kitchen donepezil (ARICEPT) 10 MG tablet Take 1 tablet (10 mg total) by mouth at bedtime. 90 tablet 1  . Dulaglutide (TRULICITY) 1.5 ZO/1.0RU SOPN Inject 1.5 mg into the skin once a week. 12 pen 3  . EPINEPHrine 0.3 mg/0.3 mL IJ SOAJ injection Inject 0.3 mg into the muscle once. Reported on 06/30/2015    . fluticasone (FLONASE) 50 MCG/ACT nasal spray USE 1 TO 2 SPRAYS IN EACH NOSTRIL DAILY (Patient not taking: Reported on 08/11/2017) 16 g 3  . fluticasone (FLOVENT HFA) 44 MCG/ACT inhaler Inhale 2 puffs into the lungs 2 (two) times daily. 3  Inhaler 3  . furosemide (LASIX) 20 MG tablet Take 2 tablets (40 mg total) by mouth daily. 180 tablet 1  . loratadine (CLARITIN) 10 MG tablet Take 1 tablet (10 mg total) by mouth daily. 90 tablet 1  . losartan (COZAAR) 50 MG tablet Take 1 tablet (50 mg total) by mouth daily. 90 tablet 0  . memantine (NAMENDA) 5 MG tablet Take 1 tablet by mouth daily for 7 days, then 1 tablet by mouth twice daily 60 tablet 1  . Multiple Vitamins-Minerals (CENTRUM SILVER PO) Take 1 tablet by mouth daily.      Marland Kitchen omeprazole (PRILOSEC) 20 MG capsule Take 1 capsule (20 mg total) by mouth daily. 90 capsule 1  . Polyethyl Glycol-Propyl Glycol (SYSTANE) 0.4-0.3 % SOLN Apply 1 drop to eye daily as needed (for dry eyes).    . SURE COMFORT PEN NEEDLES 31G X 5 MM MISC Use to inject insulin 1 time per day. 100 each 2   No current facility-administered medications on file prior to visit.     Allergies  Allergen Reactions  . Dust Mite Mixed Allergen Ext [Mite (D. Farinae)]     Family History  Problem Relation Age of Onset  . Healthy Brother   . Healthy Son   . Healthy Son   . Heart attack Neg Hx   . Colon cancer Neg Hx   . Breast cancer Neg Hx   . Stroke Neg Hx     BP 132/82 (BP Location: Left Arm, Patient Position: Sitting, Cuff Size: Large)   Pulse 95   Wt 224 lb 6.4 oz (101.8 kg)   SpO2 98%   BMI 37.34 kg/m    Review of Systems No weight loss.      Objective:   Physical Exam VITAL SIGNS:  See vs page GENERAL: no distress Pulses: foot pulses are intact bilaterally.   MSK: no deformity of the feet or ankles.  CV: no edema of the legs or ankles Skin:  no ulcer on the feet or ankles.  normal color and temp on the feet and ankles Neuro: sensation is intact to touch on the feet and ankles.      Lab Results  Component Value Date   CREATININE 1.40 (H) 04/04/2017   BUN 13 04/04/2017   NA 139 04/04/2017   K 3.6 04/04/2017  CL 104 04/04/2017   CO2 22 04/04/2017    Lab Results  Component Value  Date   HGBA1C 6.1 (A) 08/15/2017      Assessment & Plan:  Insulin-requiring type 2 DM, with polyneuropathy: overcontrolled Hypoglycemia: severe Renal insuff: in this setting, he does not need a PM insulin dose.    Patient Instructions  Please reduce the insulin to 50 units with breakfast. Please continue the same trulicity. check your blood sugar twice a day.  vary the time of day when you check, between before the 3 meals, and at bedtime.  also check if you have symptoms of your blood sugar being too high or too low.  please keep a record of the readings and bring it to your next appointment here (or you can bring the meter itself).  You can write it on any piece of paper.  please call us sooner if your blood sugar goes below 70, or if you have a lot of readings over 200.   Please come back for a follow-up appointment in 2 months.

## 2017-08-15 NOTE — Patient Instructions (Signed)
Please reduce the insulin to 50 units with breakfast. Please continue the same trulicity. check your blood sugar twice a day.  vary the time of day when you check, between before the 3 meals, and at bedtime.  also check if you have symptoms of your blood sugar being too high or too low.  please keep a record of the readings and bring it to your next appointment here (or you can bring the meter itself).  You can write it on any piece of paper.  please call us sooner if your blood sugar goes below 70, or if you have a lot of readings over 200.   Please come back for a follow-up appointment in 2 months.

## 2017-08-22 ENCOUNTER — Other Ambulatory Visit: Payer: Self-pay | Admitting: Internal Medicine

## 2017-08-22 ENCOUNTER — Other Ambulatory Visit: Payer: Self-pay | Admitting: Endocrinology

## 2017-08-23 DIAGNOSIS — J3089 Other allergic rhinitis: Secondary | ICD-10-CM | POA: Diagnosis not present

## 2017-08-29 DIAGNOSIS — N189 Chronic kidney disease, unspecified: Secondary | ICD-10-CM | POA: Diagnosis not present

## 2017-08-29 DIAGNOSIS — N2581 Secondary hyperparathyroidism of renal origin: Secondary | ICD-10-CM | POA: Diagnosis not present

## 2017-08-29 DIAGNOSIS — Z6841 Body Mass Index (BMI) 40.0 and over, adult: Secondary | ICD-10-CM | POA: Diagnosis not present

## 2017-08-29 DIAGNOSIS — J3089 Other allergic rhinitis: Secondary | ICD-10-CM | POA: Diagnosis not present

## 2017-08-29 DIAGNOSIS — N183 Chronic kidney disease, stage 3 (moderate): Secondary | ICD-10-CM | POA: Diagnosis not present

## 2017-08-29 DIAGNOSIS — E1122 Type 2 diabetes mellitus with diabetic chronic kidney disease: Secondary | ICD-10-CM | POA: Diagnosis not present

## 2017-08-29 DIAGNOSIS — F0390 Unspecified dementia without behavioral disturbance: Secondary | ICD-10-CM | POA: Diagnosis not present

## 2017-08-29 DIAGNOSIS — E785 Hyperlipidemia, unspecified: Secondary | ICD-10-CM | POA: Diagnosis not present

## 2017-08-29 DIAGNOSIS — I129 Hypertensive chronic kidney disease with stage 1 through stage 4 chronic kidney disease, or unspecified chronic kidney disease: Secondary | ICD-10-CM | POA: Diagnosis not present

## 2017-08-29 DIAGNOSIS — D631 Anemia in chronic kidney disease: Secondary | ICD-10-CM | POA: Diagnosis not present

## 2017-08-29 LAB — HEPATIC FUNCTION PANEL
ALT: 16 (ref 7–35)
AST: 18 (ref 13–35)
Alkaline Phosphatase: 92 (ref 25–125)
BILIRUBIN, TOTAL: 0.2

## 2017-08-29 LAB — IRON,TIBC AND FERRITIN PANEL
%SAT: 16
FERRITIN: 77
Iron: 53
TIBC: 322
UIBC: 269

## 2017-08-29 LAB — BASIC METABOLIC PANEL
BUN: 19 (ref 4–21)
Creatinine: 1.6 — AB (ref 0.5–1.1)
Glucose: 127
Potassium: 4.3 (ref 3.4–5.3)
Sodium: 140 (ref 137–147)

## 2017-08-29 LAB — CBC AND DIFFERENTIAL
HCT: 43 (ref 36–46)
Hemoglobin: 14.6 (ref 12.0–16.0)
Neutrophils Absolute: 4
PLATELETS: 231 (ref 150–399)
WBC: 7

## 2017-09-05 ENCOUNTER — Encounter: Payer: Self-pay | Admitting: Internal Medicine

## 2017-09-06 DIAGNOSIS — J3089 Other allergic rhinitis: Secondary | ICD-10-CM | POA: Diagnosis not present

## 2017-09-08 DIAGNOSIS — J3089 Other allergic rhinitis: Secondary | ICD-10-CM | POA: Diagnosis not present

## 2017-09-12 DIAGNOSIS — J3089 Other allergic rhinitis: Secondary | ICD-10-CM | POA: Diagnosis not present

## 2017-09-14 DIAGNOSIS — J3089 Other allergic rhinitis: Secondary | ICD-10-CM | POA: Diagnosis not present

## 2017-09-20 ENCOUNTER — Ambulatory Visit: Payer: Medicare Other | Admitting: Internal Medicine

## 2017-10-11 DIAGNOSIS — J3089 Other allergic rhinitis: Secondary | ICD-10-CM | POA: Diagnosis not present

## 2017-10-17 ENCOUNTER — Ambulatory Visit (INDEPENDENT_AMBULATORY_CARE_PROVIDER_SITE_OTHER): Payer: Medicare Other | Admitting: Endocrinology

## 2017-10-17 ENCOUNTER — Telehealth: Payer: Self-pay | Admitting: Endocrinology

## 2017-10-17 VITALS — BP 130/84 | HR 91 | Temp 97.7°F | Resp 16 | Wt 220.0 lb

## 2017-10-17 DIAGNOSIS — E118 Type 2 diabetes mellitus with unspecified complications: Secondary | ICD-10-CM

## 2017-10-17 LAB — POCT GLYCOSYLATED HEMOGLOBIN (HGB A1C): Hemoglobin A1C: 6.1 % — AB (ref 4.0–5.6)

## 2017-10-17 MED ORDER — INSULIN LISPRO PROT & LISPRO (75-25 MIX) 100 UNIT/ML KWIKPEN: 40.0000 [IU] | PEN_INJECTOR | Freq: Every day | SUBCUTANEOUS | 5 refills | Status: DC

## 2017-10-17 MED ORDER — DULAGLUTIDE 1.5 MG/0.5ML ~~LOC~~ SOAJ: 1.5000 mg | SUBCUTANEOUS | 3 refills | Status: DC

## 2017-10-17 NOTE — Progress Notes (Signed)
Subjective:    Patient ID: Angel Green, female    DOB: [birth year], 77 y.o.   MRN: [id number]  HPI Pt returns for f/u of diabetes mellitus: DM type: Insulin-requiring type 2 Dx'ed: 4196 Complications: polyneuropathy, and renal insufficiency.  Therapy: insulin since 2229, and trulicity.   GDM: never.  DKA: never Severe hypoglycemia: once, in 2019 Pancreatitis: never.  Other: she changed to QAM premixed insulin, after poor results with multiple daily injections; pattern of cbg's indicates she does not need a PM dose.   Interval history:  Pt says she does not know how many units of insulin she takes.  no cbg record, and she does not know what her cbg's are Past Medical History:  Diagnosis Date  . Allergy    Dust Mites  . Ankle pain    chronic  . Diabetes mellitus, type 2 ([place])   . Hyperlipidemia   . Hypertension   . Osteoarthritis   . RENAL INSUFFICIENCY, CHRONIC 04/16/2010  . Rhinitis    allergic nos    Past Surgical History:  Procedure Laterality Date  . CATARACT EXTRACTION    . CHOLECYSTECTOMY    . dental implants    . PARTIAL HYSTERECTOMY     in her 43s    Social History   Socioeconomic History  . Marital status: Married    Spouse name: Not on file  . Number of children: 2  . Years of education: Not on file  . Highest education level: Not on file  Occupational History  . Occupation:  retired  Scientific laboratory technician  . Financial resource strain: Not on file  . Food insecurity:    Worry: Not on file    Inability: Not on file  . Transportation needs:    Medical: Not on file    Non-medical: Not on file  Tobacco Use  . Smoking status: Never Smoker  . Smokeless tobacco: Never Used  Substance and Sexual Activity  . Alcohol use: No    Alcohol/week: 0.0 standard drinks  . Drug use: No  . Sexual activity: Yes    Partners: Male  Lifestyle  . Physical activity:    Days per week: Not on file    Minutes per session: Not on file  . Stress: Not on file    Relationships  . Social connections:    Talks on phone: Not on file    Gets together: Not on file    Attends religious service: Not on file    Active member of club or organization: Not on file    Attends meetings of clubs or organizations: Not on file    Relationship status: Not on file  . Intimate partner violence:    Fear of current or ex partner: Not on file    Emotionally abused: Not on file    Physically abused: Not on file    Forced sexual activity: Not on file  Other Topics Concern  . Not on file  Social History Narrative    husband is a Company secretary                Current Outpatient Medications on File Prior to Visit  Medication Sig Dispense Refill  . ALPRAZolam (XANAX) 0.5 MG tablet Take 1 tablet (0.5 mg total) by mouth daily as needed for anxiety. To be use before a MRI 3 tablet 0  . amLODipine (NORVASC) 5 MG tablet Take 1 tablet (5 mg total) by mouth daily. 90 tablet 1  . aspirin 81 MG tablet  Take 81 mg by mouth daily.      Marland Kitchen atorvastatin (LIPITOR) 10 MG tablet TAKE 1 TABLET DAILY 90 tablet 0  . azelastine (ASTELIN) 0.1 % nasal spray Place 2 sprays into both nostrils at bedtime as needed for rhinitis. Use in each nostril as directed 90 mL 1  . cromolyn (NASALCROM) 5.2 MG/ACT nasal spray Place 1 spray into both nostrils 4 (four) times daily. 39 mL 1  . Cyanocobalamin (B-12 PO) Take 1 tablet by mouth daily.     Marland Kitchen donepezil (ARICEPT) 10 MG tablet Take 1 tablet (10 mg total) by mouth at bedtime. 90 tablet 1  . EPINEPHrine 0.3 mg/0.3 mL IJ SOAJ injection Inject 0.3 mg into the muscle once. Reported on 06/30/2015    . fluticasone (FLONASE) 50 MCG/ACT nasal spray USE 1 TO 2 SPRAYS IN EACH NOSTRIL DAILY 16 g 3  . fluticasone (FLOVENT HFA) 44 MCG/ACT inhaler Inhale 2 puffs into the lungs 2 (two) times daily. 3 Inhaler 3  . furosemide (LASIX) 20 MG tablet Take 2 tablets (40 mg total) by mouth daily. 180 tablet 1  . loratadine (CLARITIN) 10 MG tablet Take 1 tablet (10 mg total) by  mouth daily. 90 tablet 1  . losartan (COZAAR) 50 MG tablet Take 1 tablet (50 mg total) by mouth daily. 90 tablet 1  . memantine (NAMENDA) 5 MG tablet Take 1 tablet by mouth daily for 7 days, then 1 tablet by mouth twice daily 60 tablet 1  . Multiple Vitamins-Minerals (CENTRUM SILVER PO) Take 1 tablet by mouth daily.      Marland Kitchen omeprazole (PRILOSEC) 20 MG capsule Take 1 capsule (20 mg total) by mouth daily. 90 capsule 1  . Polyethyl Glycol-Propyl Glycol (SYSTANE) 0.4-0.3 % SOLN Apply 1 drop to eye daily as needed (for dry eyes).    . SURE COMFORT PEN NEEDLES 31G X 5 MM MISC Use to inject insulin 1 time per day. 100 each 2   No current facility-administered medications on file prior to visit.     Allergies  Allergen Reactions  . Dust Mite Mixed Allergen Ext [Mite (D. Farinae)]     Family History  Problem Relation Age of Onset  . Healthy Brother   . Healthy Son   . Healthy Son   . Heart attack Neg Hx   . Colon cancer Neg Hx   . Breast cancer Neg Hx   . Stroke Neg Hx     BP 130/84   Pulse 91   Temp 97.7 F (36.5 C) (Oral)   Resp 16   Wt 220 lb (99.8 kg)   SpO2 97%   BMI 36.61 kg/m    Review of Systems She denies hypoglycemia    Objective:   Physical Exam VITAL SIGNS:  See vs page GENERAL: no distress Pulses: foot pulses are intact bilaterally.   MSK: no deformity of the feet or ankles.  CV: no edema of the legs or ankles Skin:  no ulcer on the feet or ankles.  normal color and temp on the feet and ankles.   Neuro: sensation is intact to touch on the feet and ankles.     Lab Results  Component Value Date   HGBA1C 6.1 (A) 10/17/2017   Lab Results  Component Value Date   CREATININE 1.6 (A) 08/29/2017   BUN 19 08/29/2017   NA 140 08/29/2017   K 4.3 08/29/2017   CL 104 04/04/2017   CO2 22 04/04/2017       Assessment &  Plan:  Insulin-requiring type 2 DM, with polyneuropathy: overcontrolled Memory loss: in this setting, she is not a candidate for aggresive  glycemic control Renal failure: in this setting, she does not need a PM insulin dose.   Patient Instructions  Please reduce the insulin to 40 units with breakfast.   Please continue the same trulicity.   check your blood sugar twice a day.  vary the time of day when you check, between before the 3 meals, and at bedtime.  also check if you have symptoms of your blood sugar being too high or too low.  please keep a record of the readings and bring it to your next appointment here (or you can bring the meter itself).  You can write it on any piece of paper.  please call us sooner if your blood sugar goes below 70, or if you have a lot of readings over 200.   Please come back for a follow-up appointment in 2 months.

## 2017-10-17 NOTE — Telephone Encounter (Signed)
°   Insulin Lispro Prot & Lispro (HUMALOG MIX 75/25 KWIKPEN) (75-25) 100 UNIT/ML Kwikpen      Patient stated that medication was suppose to be sent to [facility]  When she received her after visit summary it said that they had been sent to the [facility].

## 2017-10-17 NOTE — Patient Instructions (Addendum)
Please reduce the insulin to 40 units with breakfast.   Please continue the same trulicity.   check your blood sugar twice a day.  vary the time of day when you check, between before the 3 meals, and at bedtime.  also check if you have symptoms of your blood sugar being too high or too low.  please keep a record of the readings and bring it to your next appointment here (or you can bring the meter itself).  You can write it on any piece of paper.  please call us sooner if your blood sugar goes below 70, or if you have a lot of readings over 200.   Please come back for a follow-up appointment in 2 months.

## 2017-10-17 NOTE — Telephone Encounter (Signed)
Resent for pt

## 2017-10-17 NOTE — Addendum Note (Signed)
Addended by: Drucilla Schmidt on: 10/17/2017 05:01 PM   Modules accepted: Orders

## 2017-10-18 ENCOUNTER — Ambulatory Visit (INDEPENDENT_AMBULATORY_CARE_PROVIDER_SITE_OTHER): Payer: Medicare Other | Admitting: Neurology

## 2017-10-18 ENCOUNTER — Encounter

## 2017-10-18 ENCOUNTER — Encounter: Payer: Self-pay | Admitting: Neurology

## 2017-10-18 VITALS — BP 130/72 | HR 92 | Ht 65.0 in | Wt 219.5 lb

## 2017-10-18 DIAGNOSIS — F0391 Unspecified dementia with behavioral disturbance: Secondary | ICD-10-CM

## 2017-10-18 MED ORDER — MEMANTINE HCL 10 MG PO TABS: 10.0000 mg | ORAL_TABLET | Freq: Two times a day (BID) | ORAL | 4 refills | Status: DC

## 2017-10-18 MED ORDER — DONEPEZIL HCL 10 MG PO TABS: 10.0000 mg | ORAL_TABLET | Freq: Every day | ORAL | 4 refills | Status: DC

## 2017-10-18 NOTE — Progress Notes (Signed)
PATIENT: Angel Green DOB: [birth year]  Chief Complaint  Patient presents with  . Dementia    MMSE 19/30 - 9 animals.  She is here with her husband, [name], to have her decline in memory further evaluated.  She is currently taking both donepezil and memantine.  Marland Kitchen PCP    Colon Branch, MD     HISTORICAL  Angel Green is a 77 year old female, accompanied by her husband [name], seen in request by her primary care Dr. Larose Kells, [facility] for evaluation of memory loss, initial evaluation was on October 18, 2017  She had past medical history of hypertension, her husband is a retired Company secretary, she lives at home with her husband, was noted to have gradual onset memory loss since 2011, her husband retired as a Company secretary, she was found hard to keep up details, her memory loss gradually getting worse, she got lost driving recently, forget people's name, missed the doctor's appointment,  She also become more sedentary, today's Mini-Mental status examination is 19 out of 30,   Both of her parents suffered significant memory loss in the elderly age  Personally reviewed MRI of the brain in June 2019, mild generalized atrophy, more at right temporal lobe, there was no acute abnormality,  Laboratory evaluations, A1c 6.1, normal liver functional tests, CBC, iron panel, BMP showed abnormal liver functional test with creatinine of 1.6,  July 2018 laboratory evaluation showed normal or negative ESR, RPR, J85, folic acid, TSH  REVIEW OF SYSTEMS: Full 14 system review of systems performed and notable only for runny nose   ALLERGIES: Allergies  Allergen Reactions  . Dust Mite Mixed Allergen Ext [Mite (D. Farinae)]     HOME MEDICATIONS: Current Outpatient Medications  Medication Sig Dispense Refill  . ALPRAZolam (XANAX) 0.5 MG tablet Take 1 tablet (0.5 mg total) by mouth daily as needed for anxiety. To be use before a MRI 3 tablet 0  . amLODipine (NORVASC) 5 MG tablet Take 1 tablet (5 mg total) by mouth  daily. 90 tablet 1  . aspirin 81 MG tablet Take 81 mg by mouth daily.      Marland Kitchen atorvastatin (LIPITOR) 10 MG tablet TAKE 1 TABLET DAILY 90 tablet 0  . azelastine (ASTELIN) 0.1 % nasal spray Place 2 sprays into both nostrils at bedtime as needed for rhinitis. Use in each nostril as directed 90 mL 1  . cromolyn (NASALCROM) 5.2 MG/ACT nasal spray Place 1 spray into both nostrils 4 (four) times daily. 39 mL 1  . Cyanocobalamin (B-12 PO) Take 1 tablet by mouth daily.     Marland Kitchen donepezil (ARICEPT) 10 MG tablet Take 1 tablet (10 mg total) by mouth at bedtime. 90 tablet 1  . Dulaglutide (TRULICITY) 1.5 UD/1.4HF SOPN Inject 1.5 mg into the skin once a week. 12 pen 3  . EPINEPHrine 0.3 mg/0.3 mL IJ SOAJ injection Inject 0.3 mg into the muscle once. Reported on 06/30/2015    . fluticasone (FLONASE) 50 MCG/ACT nasal spray USE 1 TO 2 SPRAYS IN EACH NOSTRIL DAILY 16 g 3  . fluticasone (FLOVENT HFA) 44 MCG/ACT inhaler Inhale 2 puffs into the lungs 2 (two) times daily. 3 Inhaler 3  . furosemide (LASIX) 20 MG tablet Take 2 tablets (40 mg total) by mouth daily. 180 tablet 1  . Insulin Lispro Prot & Lispro (HUMALOG MIX 75/25 KWIKPEN) (75-25) 100 UNIT/ML Kwikpen Inject 40 Units into the skin daily with breakfast. 60 mL 5  . loratadine (CLARITIN) 10 MG tablet Take 1 tablet (10  mg total) by mouth daily. 90 tablet 1  . losartan (COZAAR) 50 MG tablet Take 1 tablet (50 mg total) by mouth daily. 90 tablet 1  . memantine (NAMENDA) 5 MG tablet Take 1 tablet by mouth daily for 7 days, then 1 tablet by mouth twice daily 60 tablet 1  . Multiple Vitamins-Minerals (CENTRUM SILVER PO) Take 1 tablet by mouth daily.      Marland Kitchen omeprazole (PRILOSEC) 20 MG capsule Take 1 capsule (20 mg total) by mouth daily. 90 capsule 1  . Polyethyl Glycol-Propyl Glycol (SYSTANE) 0.4-0.3 % SOLN Apply 1 drop to eye daily as needed (for dry eyes).    . SURE COMFORT PEN NEEDLES 31G X 5 MM MISC Use to inject insulin 1 time per day. 100 each 2   No current  facility-administered medications for this visit.     PAST MEDICAL HISTORY: Past Medical History:  Diagnosis Date  . Allergy    Dust Mites  . Ankle pain    chronic  . Diabetes mellitus, type 2 ([place])   . Hyperlipidemia   . Hypertension   . Memory loss   . Osteoarthritis   . RENAL INSUFFICIENCY, CHRONIC 04/16/2010  . Rhinitis    allergic nos    PAST SURGICAL HISTORY: Past Surgical History:  Procedure Laterality Date  . CATARACT EXTRACTION    . CHOLECYSTECTOMY    . dental implants    . PARTIAL HYSTERECTOMY     in her 57s    FAMILY HISTORY: Family History  Problem Relation Age of Onset  . Healthy Brother   . Healthy Son   . Healthy Son   . Other Mother        unsure of history - "old age"  . Other Father        unsure of history - "old age"  . Heart attack Neg Hx   . Colon cancer Neg Hx   . Breast cancer Neg Hx   . Stroke Neg Hx     SOCIAL HISTORY: Social History   Socioeconomic History  . Marital status: Married    Spouse name: Not on file  . Number of children: 2  . Years of education: some college  . Highest education level: Not on file  Occupational History  . Occupation:  retired  Scientific laboratory technician  . Financial resource strain: Not on file  . Food insecurity:    Worry: Not on file    Inability: Not on file  . Transportation needs:    Medical: Not on file    Non-medical: Not on file  Tobacco Use  . Smoking status: Never Smoker  . Smokeless tobacco: Never Used  Substance and Sexual Activity  . Alcohol use: No    Alcohol/week: 0.0 standard drinks  . Drug use: No  . Sexual activity: Yes    Partners: Male  Lifestyle  . Physical activity:    Days per week: Not on file    Minutes per session: Not on file  . Stress: Not on file  Relationships  . Social connections:    Talks on phone: Not on file    Gets together: Not on file    Attends religious service: Not on file    Active member of club or organization: Not on file    Attends meetings of  clubs or organizations: Not on file    Relationship status: Not on file  . Intimate partner violence:    Fear of current or ex partner: Not on file  Emotionally abused: Not on file    Physically abused: Not on file    Forced sexual activity: Not on file  Other Topics Concern  . Not on file  Social History Narrative   Husband is a Company secretary.   Right-handed.   1 cup caffeine daily.   Lives at home with husband.            PHYSICAL EXAM   Vitals:   10/18/17 1138  BP: 130/72  Pulse: 92  Weight: 219 lb 8 oz (99.6 kg)  Height: '5\' 5"'$  (1.651 m)    Not recorded      Body mass index is 36.53 kg/m.  PHYSICAL EXAMNIATION:  Gen: NAD, conversant, well nourised, obese, well groomed                     Cardiovascular: Regular rate rhythm, no peripheral edema, warm, nontender. Eyes: Conjunctivae clear without exudates or hemorrhage Neck: Supple, no carotid bruits. Pulmonary: Clear to auscultation bilaterally   NEUROLOGICAL EXAM:  MENTAL STATUS: MMSE - Mini Mental State Exam 10/18/2017 03/05/2016 12/19/2014  Orientation to time '1 5 5  '$ Orientation to Place '4 5 5  '$ Registration '3 3 2  '$ Attention/ Calculation '2 5 5  '$ Recall 0 2 1  Language- name 2 objects '2 2 2  '$ Language- repeat '1 1 1  '$ Language- follow 3 step command '3 3 3  '$ Language- read & follow direction '1 1 1  '$ Write a sentence '1 1 1  '$ Copy design '1 1 1  '$ Total score '19 29 27  '$ animal naming 9 CRANIAL NERVES: CN II: Visual fields are full to confrontation. Fundoscopic exam is normal with sharp discs and no vascular changes. Pupils are round equal and briskly reactive to light. CN III, IV, VI: extraocular movement are normal. No ptosis. CN V: Facial sensation is intact to pinprick in all 3 divisions bilaterally. Corneal responses are intact.  CN VII: Face is symmetric with normal eye closure and smile. CN VIII: Hearing is normal to rubbing fingers CN IX, X: Palate elevates symmetrically. Phonation is normal. CN XI: Head  turning and shoulder shrug are intact CN XII: Tongue is midline with normal movements and no atrophy.  MOTOR: There is no pronator drift of out-stretched arms. Muscle bulk and tone are normal. Muscle strength is normal.  REFLEXES: Reflexes are 2+ and symmetric at the biceps, triceps, knees, and ankles. Plantar responses are flexor.  SENSORY: Intact to light touch, pinprick, positional sensation and vibratory sensation are intact in fingers and toes.  COORDINATION: Rapid alternating movements and fine finger movements are intact. There is no dysmetria on finger-to-nose and heel-knee-shin.    GAIT/STANCE: She needs pushed up to get up from seated position, fairly steady Romberg is absent.   DIAGNOSTIC DATA (LABS, IMAGING, TESTING) - I reviewed patient records, labs, notes, testing and imaging myself where available.   ASSESSMENT AND PLAN  Angel Green is a 77 y.o. 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 Ph.D.  [facility] [address] [place], [place] [address] Ph: [phone] Fax: [phone]  CC: Colon Branch, MD

## 2017-10-24 ENCOUNTER — Other Ambulatory Visit: Payer: Self-pay | Admitting: Internal Medicine

## 2017-10-25 ENCOUNTER — Telehealth: Payer: Self-pay | Admitting: Internal Medicine

## 2017-10-25 DIAGNOSIS — J3089 Other allergic rhinitis: Secondary | ICD-10-CM | POA: Diagnosis not present

## 2017-10-25 NOTE — Telephone Encounter (Signed)
Copied from [place] [id number]. Topic: Quick Communication - Rx Refill/Question >> Oct 25, 2017  1:39 PM Yvette Rack wrote: Medication: memantine (NAMENDA) 10 MG tablet  Has the patient contacted their pharmacy? Yes.   (Agent: If no, request that the patient contact the pharmacy for the refill.) (Agent: If yes, when and what did the pharmacy advise?) went to pharmacy yesterday told him to call for a refill  Preferred Pharmacy (with phone number or street name):   [facility] ([address]), [place] [phone] (Phone) [phone] (Fax)    Agent: Please be advised that RX refills may take up to 3 business days. We ask that you follow-up with your pharmacy.

## 2017-10-25 NOTE — Telephone Encounter (Signed)
Husband requesting 1 month supply of Namenda be called to [facility] - instructed to call pt.'s neurology office. Verbalizes understanding.

## 2017-11-08 DIAGNOSIS — J3089 Other allergic rhinitis: Secondary | ICD-10-CM | POA: Diagnosis not present

## 2017-11-09 ENCOUNTER — Other Ambulatory Visit: Payer: Self-pay | Admitting: Internal Medicine

## 2017-11-09 NOTE — Progress Notes (Addendum)
Subjective:   Angel Green is a 77 y.o. female who presents for Medicare Annual (Subsequent) preventive examination.  Review of Systems: No ROS.  Medicare Wellness Visit. Additional risk factors are reflected in the social history. Cardiac Risk Factors include: advanced age (>104men, >28 women);diabetes mellitus;dyslipidemia;hypertension;obesity (BMI >30kg/m2);sedentary lifestyle Sleep patterns:   No issues. Home Safety/Smoke Alarms: Feels safe in home. Smoke alarms in place.    Female:        Mammo-   utd    Dexa scan-ordered        CCS- pt's husband states they have appt with Dr.Mann Eye- utd per pt and husband.    Objective:     Vitals: BP 136/70 (BP Location: Left Arm, Patient Position: Sitting, Cuff Size: Normal) Comment: vitals done by K.Canter CMA  Pulse 79   Ht 5\' 5"  (1.651 m)   Wt 217 lb (98.4 kg)   SpO2 96%   BMI 36.11 kg/m   Body mass index is 36.11 kg/m.  Advanced Directives 11/11/2017 04/04/2017 03/05/2016 06/16/2015 12/19/2014 04/01/2014 10/17/2013  Does Patient Have a Medical Advance Directive? Yes Yes Yes No Yes No No  Type of Paramedic of [place];Living will [place];Living will [place];Living will - [place];Living will - -  Does patient want to make changes to medical advance directive? - - - - No - Patient declined - -  Copy of [place] in Chart? No - copy requested - No - copy requested - No - copy requested - -  Would patient like information on creating a medical advance directive? - - - No - patient declined information - No - patient declined information Yes Higher education careers adviser given    Tobacco Social History   Tobacco Use  Smoking Status Never Smoker  Smokeless Tobacco Never Used     Counseling given: Not Answered   Clinical Intake: Pain : No/denies pain    Past Medical History:  Diagnosis Date  . Allergy    Dust Mites  . Ankle  pain    chronic  . Diabetes mellitus, type 2 ([place])   . Hyperlipidemia   . Hypertension   . Memory loss   . Osteoarthritis   . RENAL INSUFFICIENCY, CHRONIC 04/16/2010  . Rhinitis    allergic nos   Past Surgical History:  Procedure Laterality Date  . CATARACT EXTRACTION    . CHOLECYSTECTOMY    . dental implants    . PARTIAL HYSTERECTOMY     in her 37s   Family History  Problem Relation Age of Onset  . Healthy Brother   . Healthy Son   . Healthy Son   . Other Mother        unsure of history - "old age"  . Other Father        unsure of history - "old age"  . Heart attack Neg Hx   . Colon cancer Neg Hx   . Breast cancer Neg Hx   . Stroke Neg Hx    Social History   Socioeconomic History  . Marital status: Married    Spouse name: Not on file  . Number of children: 2  . Years of education: some college  . Highest education level: Not on file  Occupational History  . Occupation:  retired  Scientific laboratory technician  . Financial resource strain: Not on file  . Food insecurity:    Worry: Not on file    Inability:  Not on file  . Transportation needs:    Medical: Not on file    Non-medical: Not on file  Tobacco Use  . Smoking status: Never Smoker  . Smokeless tobacco: Never Used  Substance and Sexual Activity  . Alcohol use: No    Alcohol/week: 0.0 standard drinks  . Drug use: No  . Sexual activity: Yes    Partners: Male  Lifestyle  . Physical activity:    Days per week: Not on file    Minutes per session: Not on file  . Stress: Not on file  Relationships  . Social connections:    Talks on phone: Not on file    Gets together: Not on file    Attends religious service: Not on file    Active member of club or organization: Not on file    Attends meetings of clubs or organizations: Not on file    Relationship status: Not on file  Other Topics Concern  . Not on file  Social History Narrative   Husband is a Company secretary.   Right-handed.   1 cup caffeine daily.   Lives at home  with husband.           Outpatient Encounter Medications as of 11/11/2017  Medication Sig  . amLODipine (NORVASC) 5 MG tablet Take 1 tablet (5 mg total) by mouth daily.  Marland Kitchen aspirin 81 MG tablet Take 81 mg by mouth daily.    Marland Kitchen atorvastatin (LIPITOR) 10 MG tablet Take 1 tablet (10 mg total) by mouth daily.  Marland Kitchen azelastine (ASTELIN) 0.1 % nasal spray Place 2 sprays into both nostrils at bedtime as needed for rhinitis. Use in each nostril as directed  . cromolyn (NASALCROM) 5.2 MG/ACT nasal spray Place 1 spray into both nostrils 4 (four) times daily.  . Cyanocobalamin (B-12 PO) Take 1 tablet by mouth daily.   Marland Kitchen donepezil (ARICEPT) 10 MG tablet Take 1 tablet (10 mg total) by mouth at bedtime.  . Dulaglutide (TRULICITY) 1.5 PO/2.4MP SOPN Inject 1.5 mg into the skin once a week.  Marland Kitchen EPINEPHrine 0.3 mg/0.3 mL IJ SOAJ injection Inject 0.3 mg into the muscle once. Reported on 06/30/2015  . fluticasone (FLONASE) 50 MCG/ACT nasal spray USE 1 TO 2 SPRAYS IN EACH NOSTRIL DAILY  . fluticasone (FLOVENT HFA) 44 MCG/ACT inhaler Inhale 2 puffs into the lungs 2 (two) times daily.  . furosemide (LASIX) 20 MG tablet Take 2 tablets (40 mg total) by mouth daily.  . Insulin Lispro Prot & Lispro (HUMALOG MIX 75/25 KWIKPEN) (75-25) 100 UNIT/ML Kwikpen Inject 40 Units into the skin daily with breakfast. (Patient taking differently: Inject 50 Units into the skin daily with breakfast. )  . loratadine (CLARITIN) 10 MG tablet Take 1 tablet (10 mg total) by mouth daily.  Marland Kitchen losartan (COZAAR) 50 MG tablet Take 1 tablet (50 mg total) by mouth daily.  . memantine (NAMENDA) 10 MG tablet Take 1 tablet (10 mg total) by mouth 2 (two) times daily. Take 1 tablet by mouth daily for 7 days, then 1 tablet by mouth twice daily  . Multiple Vitamins-Minerals (CENTRUM SILVER PO) Take 1 tablet by mouth daily.    Marland Kitchen omeprazole (PRILOSEC) 20 MG capsule Take 1 capsule (20 mg total) by mouth daily.  Vladimir Faster Glycol-Propyl Glycol (SYSTANE) 0.4-0.3 %  SOLN Apply 1 drop to eye daily as needed (for dry eyes).  . SURE COMFORT PEN NEEDLES 31G X 5 MM MISC Use to inject insulin 1 time per day.  . [DISCONTINUED]  ALPRAZolam (XANAX) 0.5 MG tablet Take 1 tablet (0.5 mg total) by mouth daily as needed for anxiety. To be use before a MRI (Patient not taking: Reported on 11/11/2017)  . [DISCONTINUED] atorvastatin (LIPITOR) 10 MG tablet TAKE 1 TABLET DAILY   No facility-administered encounter medications on file as of 11/11/2017.     Activities of Daily Living In your present state of health, do you have any difficulty performing the following activities: 11/11/2017 05/05/2017  Hearing? N N  Vision? N N  Difficulty concentrating or making decisions? Tempie Donning  Walking or climbing stairs? N Y  Dressing or bathing? N N  Doing errands, shopping? Y Y  Comment No longer driving. -  Preparing Food and eating ? N -  Using the Toilet? N -  In the past six months, have you accidently leaked urine? N -  Do you have problems with loss of bowel control? N -  Managing your Medications? N -  Managing your Finances? N -  Housekeeping or managing your Housekeeping? N -  Some recent data might be hidden    Patient Care Team: Colon Branch, MD as PCP - General Renato Shin, MD as Consulting Physician (Endocrinology) Donato Heinz, MD as Consulting Physician (Nephrology) Tiajuana Amass, MD as Referring Physician (Allergy and Immunology) Juanita Craver, MD as Consulting Physician (Gastroenterology) Renette Butters, MD as Attending Physician (Orthopedic Surgery)    Assessment:   This is a routine wellness examination for Angel Green. Physical assessment deferred to PCP.  Exercise Activities and Dietary recommendations Current Exercise Habits: The patient does not participate in regular exercise at present, Exercise limited by: None identified Diet (meal preparation, eat out, water intake, caffeinated beverages, dairy products, fruits and vegetables): in general, a  "healthy" diet  , well balanced   Goals    . Increase physical activity     Walking- walking the [place] and Treadmill    . Lose 20 lbs by next year.   (pt-stated)       Fall Risk Fall Risk  11/11/2017 05/05/2017 03/05/2016 12/19/2014 12/18/2013  Falls in the past year? No Yes No No No  Number falls in past yr: - 1 - - -  Injury with Fall? - Yes - - -  Follow up - Falls evaluation completed - - -   Depression Screen PHQ 2/9 Scores 11/11/2017 05/05/2017 03/05/2016 12/19/2014  PHQ - 2 Score 0 0 0 0     Cognitive Function MMSE - Mini Mental State Exam 11/11/2017 10/18/2017 03/05/2016 12/19/2014  Orientation to time 3 1 5 5   Orientation to Place 5 4 5 5   Registration 3 3 3 2   Attention/ Calculation 2 2 5 5   Recall 3 0 2 1  Language- name 2 objects 2 2 2 2   Language- repeat 1 1 1 1   Language- follow 3 step command 0 3 3 3   Language- read & follow direction 1 1 1 1   Write a sentence 1 1 1 1   Copy design 0 1 1 1   Total score 21 19 29 27         Immunization History  Administered Date(s) Administered  . Influenza Split 12/02/2011, 12/21/2013  . Influenza Whole 12/21/2006, 11/28/2007, 11/22/2008, 11/23/2010  . Influenza, High Dose Seasonal PF 11/29/2012, 12/02/2015, 12/06/2016, 11/11/2017  . Influenza-Unspecified 12/04/2010, 12/18/2013, 12/05/2014  . Pneumococcal Conjugate-13 12/18/2013  . Pneumococcal Polysaccharide-23 11/14/2007  . Td 11/14/2007    Screening Tests Health Maintenance  Topic Date Due  . OPHTHALMOLOGY EXAM  03/09/2015  .  TETANUS/TDAP  11/13/2017  . HEMOGLOBIN A1C  04/19/2018  . FOOT EXAM  10/18/2018  . MAMMOGRAM  01/18/2019  . INFLUENZA VACCINE  Completed  . DEXA SCAN  Completed  . PNA vac Low Risk Adult  Completed       Plan:    Please schedule your next medicare wellness visit with me in 1 yr.  I have ordered your bone density. Please schedule.   Continue to eat heart healthy diet (full of fruits, vegetables, whole grains, lean protein, water--limit  salt, fat, and sugar intake) and increase physical activity as tolerated.  Continue doing brain stimulating activities (puzzles, reading, adult coloring books, staying active) to keep memory sharp.   Bring a copy of your living will and/or healthcare power of attorney to your next office visit.   I have personally reviewed and noted the following in the patient's chart:   . Medical and social history . Use of alcohol, tobacco or illicit drugs  . Current medications and supplements . Functional ability and status . Nutritional status . Physical activity . Advanced directives . List of other physicians . Hospitalizations, surgeries, and ER visits in previous 12 months . Vitals . Screenings to include cognitive, depression, and falls . Referrals and appointments  In addition, I have reviewed and discussed with patient certain preventive protocols, quality metrics, and best practice recommendations. A written personalized care plan for preventive services as well as general preventive health recommendations were provided to patient.     Naaman Plummer [place], [place]  11/11/2017  Kathlene November, MD

## 2017-11-11 ENCOUNTER — Encounter: Payer: Self-pay | Admitting: Internal Medicine

## 2017-11-11 ENCOUNTER — Ambulatory Visit (INDEPENDENT_AMBULATORY_CARE_PROVIDER_SITE_OTHER): Payer: Medicare Other | Admitting: *Deleted

## 2017-11-11 ENCOUNTER — Ambulatory Visit (INDEPENDENT_AMBULATORY_CARE_PROVIDER_SITE_OTHER): Payer: Medicare Other | Admitting: Internal Medicine

## 2017-11-11 ENCOUNTER — Encounter: Payer: Self-pay | Admitting: *Deleted

## 2017-11-11 VITALS — BP 136/70 | HR 79 | Temp 97.6°F | Resp 16 | Ht 65.0 in | Wt 217.4 lb

## 2017-11-11 VITALS — BP 136/70 | HR 79 | Ht 65.0 in | Wt 217.0 lb

## 2017-11-11 DIAGNOSIS — Z0000 Encounter for general adult medical examination without abnormal findings: Secondary | ICD-10-CM | POA: Diagnosis not present

## 2017-11-11 DIAGNOSIS — Z23 Encounter for immunization: Secondary | ICD-10-CM | POA: Diagnosis not present

## 2017-11-11 DIAGNOSIS — F0391 Unspecified dementia with behavioral disturbance: Secondary | ICD-10-CM | POA: Diagnosis not present

## 2017-11-11 DIAGNOSIS — E118 Type 2 diabetes mellitus with unspecified complications: Secondary | ICD-10-CM | POA: Diagnosis not present

## 2017-11-11 DIAGNOSIS — I10 Essential (primary) hypertension: Secondary | ICD-10-CM | POA: Diagnosis not present

## 2017-11-11 DIAGNOSIS — L989 Disorder of the skin and subcutaneous tissue, unspecified: Secondary | ICD-10-CM

## 2017-11-11 DIAGNOSIS — Z780 Asymptomatic menopausal state: Secondary | ICD-10-CM

## 2017-11-11 NOTE — Patient Instructions (Signed)
Please schedule your next medicare wellness visit with me in 1 yr.  I have ordered your bone density. Please schedule.   Continue to eat heart healthy diet (full of fruits, vegetables, whole grains, lean protein, water--limit salt, fat, and sugar intake) and increase physical activity as tolerated.  Continue doing brain stimulating activities (puzzles, reading, adult coloring books, staying active) to keep memory sharp.   Bring a copy of your living will and/or healthcare power of attorney to your next office visit.   Angel Green , Thank you for taking time to come for your Medicare Wellness Visit. I appreciate your ongoing commitment to your health goals. Please review the following plan we discussed and let me know if I can assist you in the future.   These are the goals we discussed: Goals    . Increase physical activity     Walking- walking the [place] and Treadmill    . Lose 20 lbs by next year.   (pt-stated)       This is a list of the screening recommended for you and due dates:  Health Maintenance  Topic Date Due  . Eye exam for diabetics  03/09/2015  . Tetanus Vaccine  11/13/2017  . Hemoglobin A1C  04/19/2018  . Complete foot exam   10/18/2018  . Mammogram  01/18/2019  . Flu Shot  Completed  . DEXA scan (bone density measurement)  Completed  . Pneumonia vaccines  Completed    Health Maintenance for Postmenopausal Women Menopause is a normal process in which your reproductive ability comes to an end. This process happens gradually over a span of months to years, usually between the ages of 6 and 27. Menopause is complete when you have missed 12 consecutive menstrual periods. It is important to talk with your health care provider about some of the most common conditions that affect postmenopausal women, such as heart disease, cancer, and bone loss (osteoporosis). Adopting a healthy lifestyle and getting preventive care can help to promote your health and wellness. Those  actions can also lower your chances of developing some of these common conditions. What should I know about menopause? During menopause, you may experience a number of symptoms, such as:  Moderate-to-severe hot flashes.  Night sweats.  Decrease in sex drive.  Mood swings.  Headaches.  Tiredness.  Irritability.  Memory problems.  Insomnia.  Choosing to treat or not to treat menopausal changes is an individual decision that you make with your health care provider. What should I know about hormone replacement therapy and supplements? Hormone therapy products are effective for treating symptoms that are associated with menopause, such as hot flashes and night sweats. Hormone replacement carries certain risks, especially as you become older. If you are thinking about using estrogen or estrogen with progestin treatments, discuss the benefits and risks with your health care provider. What should I know about heart disease and stroke? Heart disease, heart attack, and stroke become more likely as you age. This may be due, in part, to the hormonal changes that your body experiences during menopause. These can affect how your body processes dietary fats, triglycerides, and cholesterol. Heart attack and stroke are both medical emergencies. There are many things that you can do to help prevent heart disease and stroke:  Have your blood pressure checked at least every 1-2 years. High blood pressure causes heart disease and increases the risk of stroke.  If you are 91-66 years old, ask your health care provider if you should take  aspirin to prevent a heart attack or a stroke.  Do not use any tobacco products, including cigarettes, chewing tobacco, or electronic cigarettes. If you need help quitting, ask your health care provider.  It is important to eat a healthy diet and maintain a healthy weight. ? Be sure to include plenty of vegetables, fruits, low-fat dairy products, and lean  protein. ? Avoid eating foods that are high in solid fats, added sugars, or salt (sodium).  Get regular exercise. This is one of the most important things that you can do for your health. ? Try to exercise for at least 150 minutes each week. The type of exercise that you do should increase your heart rate and make you sweat. This is known as moderate-intensity exercise. ? Try to do strengthening exercises at least twice each week. Do these in addition to the moderate-intensity exercise.  Know your numbers.Ask your health care provider to check your cholesterol and your blood glucose. Continue to have your blood tested as directed by your health care provider.  What should I know about cancer screening? There are several types of cancer. Take the following steps to reduce your risk and to catch any cancer development as early as possible. Breast Cancer  Practice breast self-awareness. ? This means understanding how your breasts normally appear and feel. ? It also means doing regular breast self-exams. Let your health care provider know about any changes, no matter how small.  If you are 73 or older, have a clinician do a breast exam (clinical breast exam or CBE) every year. Depending on your age, family history, and medical history, it may be recommended that you also have a yearly breast X-ray (mammogram).  If you have a family history of breast cancer, talk with your health care provider about genetic screening.  If you are at high risk for breast cancer, talk with your health care provider about having an MRI and a mammogram every year.  Breast cancer (BRCA) gene test is recommended for women who have family members with BRCA-related cancers. Results of the assessment will determine the need for genetic counseling and BRCA1 and for BRCA2 testing. BRCA-related cancers include these types: ? Breast. This occurs in males or females. ? Ovarian. ? Tubal. This may also be called fallopian tube  cancer. ? Cancer of the abdominal or pelvic lining (peritoneal cancer). ? Prostate. ? Pancreatic.  Cervical, Uterine, and Ovarian Cancer Your health care provider may recommend that you be screened regularly for cancer of the pelvic organs. These include your ovaries, uterus, and vagina. This screening involves a pelvic exam, which includes checking for microscopic changes to the surface of your cervix (Pap test).  For women ages 21-65, health care providers may recommend a pelvic exam and a Pap test every three years. For women ages 44-65, they may recommend the Pap test and pelvic exam, combined with testing for human papilloma virus (HPV), every five years. Some types of HPV increase your risk of cervical cancer. Testing for HPV may also be done on women of any age who have unclear Pap test results.  Other health care providers may not recommend any screening for nonpregnant women who are considered low risk for pelvic cancer and have no symptoms. Ask your health care provider if a screening pelvic exam is right for you.  If you have had past treatment for cervical cancer or a condition that could lead to cancer, you need Pap tests and screening for cancer for at least 20  years after your treatment. If Pap tests have been discontinued for you, your risk factors (such as having a new sexual partner) need to be reassessed to determine if you should start having screenings again. Some women have medical problems that increase the chance of getting cervical cancer. In these cases, your health care provider may recommend that you have screening and Pap tests more often.  If you have a family history of uterine cancer or ovarian cancer, talk with your health care provider about genetic screening.  If you have vaginal bleeding after reaching menopause, tell your health care provider.  There are currently no reliable tests available to screen for ovarian cancer.  Lung Cancer Lung cancer screening is  recommended for adults 49-56 years old who are at high risk for lung cancer because of a history of smoking. A yearly low-dose CT scan of the lungs is recommended if you:  Currently smoke.  Have a history of at least 30 pack-years of smoking and you currently smoke or have quit within the past 15 years. A pack-year is smoking an average of one pack of cigarettes per day for one year.  Yearly screening should:  Continue until it has been 15 years since you quit.  Stop if you develop a health problem that would prevent you from having lung cancer treatment.  Colorectal Cancer  This type of cancer can be detected and can often be prevented.  Routine colorectal cancer screening usually begins at age 46 and continues through age 58.  If you have risk factors for colon cancer, your health care provider may recommend that you be screened at an earlier age.  If you have a family history of colorectal cancer, talk with your health care provider about genetic screening.  Your health care provider may also recommend using home test kits to check for hidden blood in your stool.  A small camera at the end of a tube can be used to examine your colon directly (sigmoidoscopy or colonoscopy). This is done to check for the earliest forms of colorectal cancer.  Direct examination of the colon should be repeated every 5-10 years until age 30. However, if early forms of precancerous polyps or small growths are found or if you have a family history or genetic risk for colorectal cancer, you may need to be screened more often.  Skin Cancer  Check your skin from head to toe regularly.  Monitor any moles. Be sure to tell your health care provider: ? About any new moles or changes in moles, especially if there is a change in a mole's shape or color. ? If you have a mole that is larger than the size of a pencil eraser.  If any of your family members has a history of skin cancer, especially at a young age,  talk with your health care provider about genetic screening.  Always use sunscreen. Apply sunscreen liberally and repeatedly throughout the day.  Whenever you are outside, protect yourself by wearing long sleeves, pants, a wide-brimmed hat, and sunglasses.  What should I know about osteoporosis? Osteoporosis is a condition in which bone destruction happens more quickly than new bone creation. After menopause, you may be at an increased risk for osteoporosis. To help prevent osteoporosis or the bone fractures that can happen because of osteoporosis, the following is recommended:  If you are 38-20 years old, get at least 1,000 mg of calcium and at least 600 mg of vitamin D per day.  If you are  older than age 58 but younger than age 56, get at least 1,200 mg of calcium and at least 600 mg of vitamin D per day.  If you are older than age 28, get at least 1,200 mg of calcium and at least 800 mg of vitamin D per day.  Smoking and excessive alcohol intake increase the risk of osteoporosis. Eat foods that are rich in calcium and vitamin D, and do weight-bearing exercises several times each week as directed by your health care provider. What should I know about how menopause affects my mental health? Depression may occur at any age, but it is more common as you become older. Common symptoms of depression include:  Low or sad mood.  Changes in sleep patterns.  Changes in appetite or eating patterns.  Feeling an overall lack of motivation or enjoyment of activities that you previously enjoyed.  Frequent crying spells.  Talk with your health care provider if you think that you are experiencing depression. What should I know about immunizations? It is important that you get and maintain your immunizations. These include:  Tetanus, diphtheria, and pertussis (Tdap) booster vaccine.  Influenza every year before the flu season begins.  Pneumonia vaccine.  Shingles vaccine.  Your health care  provider may also recommend other immunizations. This information is not intended to replace advice given to you by your health care provider. Make sure you discuss any questions you have with your health care provider. Document Released: 04/02/2005 Document Revised: 08/29/2015 Document Reviewed: 11/12/2014 Elsevier Interactive Patient Education  2018 [employer].

## 2017-11-11 NOTE — Patient Instructions (Signed)
  GO TO THE FRONT DESK Schedule your next appointment for a check up in 4 months

## 2017-11-11 NOTE — Progress Notes (Signed)
Pre visit review using our clinic review tool, if applicable. No additional management support is needed unless otherwise documented below in the visit note. 

## 2017-11-11 NOTE — Progress Notes (Addendum)
Subjective:    Patient ID: Angel Green, female    DOB: May 20, 1940, 77 y.o.   MRN: [id number]  DOS:  11/11/2017 Type of visit - description : f/u Interval history: Mostly obtained from the husband. Here with her husband, doing well. Report dry skin lesion at the chin.   Review of Systems No fever chills No chest pain or difficulty breathing No nausea vomiting. Behavioral much improved.  Past Medical History:  Diagnosis Date  . Allergy    Dust Mites  . Ankle pain    chronic  . Diabetes mellitus, type 2 ([place])   . Hyperlipidemia   . Hypertension   . Memory loss   . Osteoarthritis   . RENAL INSUFFICIENCY, CHRONIC 04/16/2010  . Rhinitis    allergic nos    Past Surgical History:  Procedure Laterality Date  . CATARACT EXTRACTION    . CHOLECYSTECTOMY    . dental implants    . PARTIAL HYSTERECTOMY     in her 12s    Social History   Socioeconomic History  . Marital status: Married    Spouse name: Not on file  . Number of children: 2  . Years of education: some college  . Highest education level: Not on file  Occupational History  . Occupation:  retired  Scientific laboratory technician  . Financial resource strain: Not on file  . Food insecurity:    Worry: Not on file    Inability: Not on file  . Transportation needs:    Medical: Not on file    Non-medical: Not on file  Tobacco Use  . Smoking status: Never Smoker  . Smokeless tobacco: Never Used  Substance and Sexual Activity  . Alcohol use: No    Alcohol/week: 0.0 standard drinks  . Drug use: No  . Sexual activity: Yes    Partners: Male  Lifestyle  . Physical activity:    Days per week: Not on file    Minutes per session: Not on file  . Stress: Not on file  Relationships  . Social connections:    Talks on phone: Not on file    Gets together: Not on file    Attends religious service: Not on file    Active member of club or organization: Not on file    Attends meetings of clubs or organizations: Not on file   Relationship status: Not on file  . Intimate partner violence:    Fear of current or ex partner: Not on file    Emotionally abused: Not on file    Physically abused: Not on file    Forced sexual activity: Not on file  Other Topics Concern  . Not on file  Social History Narrative   Husband is a Company secretary.   Right-handed.   1 cup caffeine daily.   Lives at home with husband.             Allergies as of 11/11/2017      Reactions   Dust Mite Mixed Allergen Ext [mite (d. Farinae)]       Medication List        Accurate as of 11/11/17 11:59 PM. Always use your most recent med list.          amLODipine 5 MG tablet Commonly known as:  NORVASC Take 1 tablet (5 mg total) by mouth daily.   aspirin 81 MG tablet Take 81 mg by mouth daily.   atorvastatin 10 MG tablet Commonly known as:  LIPITOR Take 1 tablet (  10 mg total) by mouth daily.   azelastine 0.1 % nasal spray Commonly known as:  ASTELIN Place 2 sprays into both nostrils at bedtime as needed for rhinitis. Use in each nostril as directed   B-12 PO Take 1 tablet by mouth daily.   CENTRUM SILVER PO Take 1 tablet by mouth daily.   cromolyn 5.2 MG/ACT nasal spray Commonly known as:  NASALCROM Place 1 spray into both nostrils 4 (four) times daily.   donepezil 10 MG tablet Commonly known as:  ARICEPT Take 1 tablet (10 mg total) by mouth at bedtime.   Dulaglutide 1.5 MG/0.5ML Sopn Inject 1.5 mg into the skin once a week.   EPINEPHrine 0.3 mg/0.3 mL Soaj injection Commonly known as:  EPI-PEN Inject 0.3 mg into the muscle once. Reported on 06/30/2015   fluticasone 44 MCG/ACT inhaler Commonly known as:  FLOVENT HFA Inhale 2 puffs into the lungs 2 (two) times daily.   fluticasone 50 MCG/ACT nasal spray Commonly known as:  FLONASE USE 1 TO 2 SPRAYS IN EACH NOSTRIL DAILY   furosemide 20 MG tablet Commonly known as:  LASIX Take 2 tablets (40 mg total) by mouth daily.   Insulin Lispro Prot & Lispro (75-25) 100  UNIT/ML Kwikpen Commonly known as:  HUMALOG 75/25 MIX Inject 40 Units into the skin daily with breakfast.   loratadine 10 MG tablet Commonly known as:  CLARITIN Take 1 tablet (10 mg total) by mouth daily.   losartan 50 MG tablet Commonly known as:  COZAAR Take 1 tablet (50 mg total) by mouth daily.   memantine 10 MG tablet Commonly known as:  NAMENDA Take 1 tablet (10 mg total) by mouth 2 (two) times daily. Take 1 tablet by mouth daily for 7 days, then 1 tablet by mouth twice daily   omeprazole 20 MG capsule Commonly known as:  PRILOSEC Take 1 capsule (20 mg total) by mouth daily.   SURE COMFORT PEN NEEDLES 31G X 5 MM Misc Generic drug:  Insulin Pen Needle Use to inject insulin 1 time per day.   SYSTANE 0.4-0.3 % Soln Generic drug:  Polyethyl Glycol-Propyl Glycol Apply 1 drop to eye daily as needed (for dry eyes).          Objective:   Physical Exam BP 136/70 (BP Location: Left Arm, Patient Position: Sitting, Cuff Size: Normal)   Pulse 79   Temp 97.6 F (36.4 C) (Oral)   Resp 16   Ht 5\' 5"  (1.651 m)   Wt 217 lb 6 oz (98.6 kg)   SpO2 96%   BMI 36.17 kg/m  General:   Well developed, NAD, see BMI.  HEENT:  Normocephalic . Face symmetric, atraumatic Lungs:  CTA B Normal respiratory effort, no intercostal retractions, no accessory muscle use. Heart: RRR,  no murmur.  No pretibial edema bilaterally  Skin: se picture Neurologic:  alert & oriented to self, not in time or place.  Pleasent. Speech normal, gait appropriate for age and unassisted Psych--  No anxious or depressed appearing.         Assessment & Plan:    Assessment   DM  Dr Loanne Drilling + Neuropathy: Per foot exam 12-2014 HTN ---change ACE to ARB is 03/2014 Hyperlipidemia CRI  dx 2012  Sees Dr Arty Baumgartner  Morbid obesity DJD, had  chronic ankle pain Allergies -- dust mites , occ uses a inhaler , sees allergist, has shots q weeks started ~ 08-2014 Dementia:  MMSE 7/218: 24, rx Aricept.  B12, RPR,  sed  rate and TSH normal MMSE 07/2017 :13 worse MRI brain 6/19:  prominent right temporal lobe volume loss.  PLAN: DM: Per Dr. Loanne Drilling.  No more hypoglycemia HTN: Controlled with Lasix, losartan. Dementia: Saw neurology, Namenda dose increased, continue with Aricept.  Things are going well.  Well  Taken care of by her husband.  She is not wandering, much better behavior, she still would like to drive but she has not been permitted to by the family. Skin lesions, itching, se picture, no clear etiology.  Refer to dermatology Flu shot today RTC 4 months

## 2017-11-13 NOTE — Assessment & Plan Note (Signed)
DM: Per Dr. Loanne Drilling.  No more hypoglycemia HTN: Controlled with Lasix, losartan. Dementia: Saw neurology, Namenda dose increased, continue with Aricept.  Things are going well.  Well  Taken care of by her husband.  She is not wandering, much better behavior, she still would like to drive but she has not been permitted to by the family. Skin lesions, itching, se picture, no clear etiology.  Refer to dermatology Flu shot today RTC 4 months

## 2017-11-15 ENCOUNTER — Ambulatory Visit (HOSPITAL_BASED_OUTPATIENT_CLINIC_OR_DEPARTMENT_OTHER)
Admission: RE | Admit: 2017-11-15 | Discharge: 2017-11-15 | Disposition: A | Discharge status: Home / Self Care | Payer: Medicare Other | Source: Ambulatory Visit | Attending: Internal Medicine | Admitting: Internal Medicine

## 2017-11-15 DIAGNOSIS — Z780 Asymptomatic menopausal state: Secondary | ICD-10-CM | POA: Diagnosis not present

## 2017-11-15 DIAGNOSIS — L738 Other specified follicular disorders: Secondary | ICD-10-CM | POA: Diagnosis not present

## 2017-11-15 DIAGNOSIS — L281 Prurigo nodularis: Secondary | ICD-10-CM | POA: Diagnosis not present

## 2017-11-15 DIAGNOSIS — E2839 Other primary ovarian failure: Secondary | ICD-10-CM | POA: Diagnosis not present

## 2017-11-22 DIAGNOSIS — J3089 Other allergic rhinitis: Secondary | ICD-10-CM | POA: Diagnosis not present

## 2017-11-22 DIAGNOSIS — K219 Gastro-esophageal reflux disease without esophagitis: Secondary | ICD-10-CM | POA: Diagnosis not present

## 2017-11-22 DIAGNOSIS — Z86010 Personal history of colonic polyps: Secondary | ICD-10-CM | POA: Diagnosis not present

## 2017-11-22 DIAGNOSIS — Z1211 Encounter for screening for malignant neoplasm of colon: Secondary | ICD-10-CM | POA: Diagnosis not present

## 2017-11-30 ENCOUNTER — Telehealth: Payer: Self-pay | Admitting: *Deleted

## 2017-11-30 NOTE — Telephone Encounter (Signed)
Pt dept [facility] form ready @ front desk for p/u.

## 2017-12-06 DIAGNOSIS — J3089 Other allergic rhinitis: Secondary | ICD-10-CM | POA: Diagnosis not present

## 2017-12-13 ENCOUNTER — Other Ambulatory Visit: Payer: Self-pay | Admitting: Internal Medicine

## 2017-12-13 DIAGNOSIS — Z1231 Encounter for screening mammogram for malignant neoplasm of breast: Secondary | ICD-10-CM

## 2017-12-19 ENCOUNTER — Ambulatory Visit: Payer: Medicare Other | Admitting: Endocrinology

## 2017-12-20 DIAGNOSIS — J3089 Other allergic rhinitis: Secondary | ICD-10-CM | POA: Diagnosis not present

## 2017-12-22 ENCOUNTER — Ambulatory Visit (INDEPENDENT_AMBULATORY_CARE_PROVIDER_SITE_OTHER): Payer: Medicare Other | Admitting: Endocrinology

## 2017-12-22 ENCOUNTER — Encounter: Payer: Self-pay | Admitting: Endocrinology

## 2017-12-22 VITALS — BP 126/74 | HR 97 | Ht 64.75 in | Wt 220.8 lb

## 2017-12-22 DIAGNOSIS — E119 Type 2 diabetes mellitus without complications: Secondary | ICD-10-CM | POA: Diagnosis not present

## 2017-12-22 LAB — POCT GLYCOSYLATED HEMOGLOBIN (HGB A1C): Hemoglobin A1C: 6.2 % — AB (ref 4.0–5.6)

## 2017-12-22 MED ORDER — GLUCOSE BLOOD VI STRP: 1.0000 | ORAL_STRIP | Freq: Two times a day (BID) | 3 refills | Status: DC

## 2017-12-22 MED ORDER — INSULIN LISPRO PROT & LISPRO (75-25 MIX) 100 UNIT/ML KWIKPEN: 40.0000 [IU] | PEN_INJECTOR | Freq: Every day | SUBCUTANEOUS | 5 refills | Status: DC

## 2017-12-22 NOTE — Progress Notes (Signed)
Subjective:    Patient ID: Angel Green, female    DOB: [birth year], 77 y.o.   MRN: [id number]  HPI Pt returns for f/u of diabetes mellitus: DM type: Insulin-requiring type 2 Dx'ed: 0865 Complications: polyneuropathy, and renal insufficiency.   Therapy: insulin since 7846, and trulicity.   GDM: never.  DKA: never.   Severe hypoglycemia: once, in 2019.   Pancreatitis: never.  Other: she changed to QAM premixed insulin, after poor results with multiple daily injections; pattern of cbg's indicates she does not need a PM dose.   Interval history:  Pt takes 50 units qam.  no cbg record, but she says cbg's are approx 100.  She denies hypoglycemia.  pt states she feels well in general.  She has a meter and strips.  However, husband asks her, and she responds that she does not have a meter or strips.   Past Medical History:  Diagnosis Date  . Allergy    Dust Mites  . Ankle pain    chronic  . Diabetes mellitus, type 2 ([place])   . Hyperlipidemia   . Hypertension   . Memory loss   . Osteoarthritis   . RENAL INSUFFICIENCY, CHRONIC 04/16/2010  . Rhinitis    allergic nos    Past Surgical History:  Procedure Laterality Date  . CATARACT EXTRACTION    . CHOLECYSTECTOMY    . dental implants    . PARTIAL HYSTERECTOMY     in her 69s    Social History   Socioeconomic History  . Marital status: Married    Spouse name: Not on file  . Number of children: 2  . Years of education: some college  . Highest education level: Not on file  Occupational History  . Occupation:  retired  Scientific laboratory technician  . Financial resource strain: Not on file  . Food insecurity:    Worry: Not on file    Inability: Not on file  . Transportation needs:    Medical: Not on file    Non-medical: Not on file  Tobacco Use  . Smoking status: Never Smoker  . Smokeless tobacco: Never Used  Substance and Sexual Activity  . Alcohol use: No    Alcohol/week: 0.0 standard drinks  . Drug use: No  . Sexual activity: Yes     Partners: Male  Lifestyle  . Physical activity:    Days per week: Not on file    Minutes per session: Not on file  . Stress: Not on file  Relationships  . Social connections:    Talks on phone: Not on file    Gets together: Not on file    Attends religious service: Not on file    Active member of club or organization: Not on file    Attends meetings of clubs or organizations: Not on file    Relationship status: Not on file  . Intimate partner violence:    Fear of current or ex partner: Not on file    Emotionally abused: Not on file    Physically abused: Not on file    Forced sexual activity: Not on file  Other Topics Concern  . Not on file  Social History Narrative   Husband is a Company secretary.   Right-handed.   1 cup caffeine daily.   Lives at home with husband.           Current Outpatient Medications on File Prior to Visit  Medication Sig Dispense Refill  . amLODipine (NORVASC) 5 MG tablet Take  1 tablet (5 mg total) by mouth daily. 90 tablet 1  . aspirin 81 MG tablet Take 81 mg by mouth daily.      Marland Kitchen atorvastatin (LIPITOR) 10 MG tablet Take 1 tablet (10 mg total) by mouth daily. 90 tablet 1  . azelastine (ASTELIN) 0.1 % nasal spray Place 2 sprays into both nostrils at bedtime as needed for rhinitis. Use in each nostril as directed 90 mL 1  . cromolyn (NASALCROM) 5.2 MG/ACT nasal spray Place 1 spray into both nostrils 4 (four) times daily. 39 mL 1  . Cyanocobalamin (B-12 PO) Take 1 tablet by mouth daily.     Marland Kitchen donepezil (ARICEPT) 10 MG tablet Take 1 tablet (10 mg total) by mouth at bedtime. 90 tablet 4  . Dulaglutide (TRULICITY) 1.5 GY/5.6LS SOPN Inject 1.5 mg into the skin once a week. 12 pen 3  . EPINEPHrine 0.3 mg/0.3 mL IJ SOAJ injection Inject 0.3 mg into the muscle once. Reported on 06/30/2015    . fluticasone (FLONASE) 50 MCG/ACT nasal spray USE 1 TO 2 SPRAYS IN EACH NOSTRIL DAILY 16 g 3  . fluticasone (FLOVENT HFA) 44 MCG/ACT inhaler Inhale 2 puffs into the lungs 2  (two) times daily. 3 Inhaler 3  . furosemide (LASIX) 20 MG tablet Take 2 tablets (40 mg total) by mouth daily. 180 tablet 1  . loratadine (CLARITIN) 10 MG tablet Take 1 tablet (10 mg total) by mouth daily. 90 tablet 1  . losartan (COZAAR) 50 MG tablet Take 1 tablet (50 mg total) by mouth daily. 90 tablet 1  . memantine (NAMENDA) 10 MG tablet Take 1 tablet (10 mg total) by mouth 2 (two) times daily. Take 1 tablet by mouth daily for 7 days, then 1 tablet by mouth twice daily 180 tablet 4  . Multiple Vitamins-Minerals (CENTRUM SILVER PO) Take 1 tablet by mouth daily.      Marland Kitchen omeprazole (PRILOSEC) 20 MG capsule Take 1 capsule (20 mg total) by mouth daily. 90 capsule 1  . Polyethyl Glycol-Propyl Glycol (SYSTANE) 0.4-0.3 % SOLN Apply 1 drop to eye daily as needed (for dry eyes).    . SURE COMFORT PEN NEEDLES 31G X 5 MM MISC Use to inject insulin 1 time per day. 100 each 2   No current facility-administered medications on file prior to visit.     Allergies  Allergen Reactions  . Dust Mite Mixed Allergen Ext [Mite (D. Farinae)]     Family History  Problem Relation Age of Onset  . Healthy Brother   . Healthy Son   . Healthy Son   . Other Mother        unsure of history - "old age"  . Other Father        unsure of history - "old age"  . Heart attack Neg Hx   . Colon cancer Neg Hx   . Breast cancer Neg Hx   . Stroke Neg Hx     BP 126/74 (BP Location: Right Arm, Patient Position: Sitting, Cuff Size: Large)   Pulse 97   Ht 5' 4.75" (1.645 m)   Wt 220 lb 12.8 oz (100.2 kg)   SpO2 95%   BMI 37.03 kg/m    Review of Systems She denies hypoglycemia.      Objective:   Physical Exam VITAL SIGNS:  See vs page GENERAL: no distress Pulses: foot pulses are intact bilaterally.   MSK: no deformity of the feet or ankles.  CV: no edema of the legs or  ankles Skin:  no ulcer on the feet or ankles.  normal color and temp on the feet and ankles.   Neuro: sensation is intact to touch on the feet  and ankles.    Lab Results  Component Value Date   HGBA1C 6.2 (A) 12/22/2017   Lab Results  Component Value Date   CREATININE 1.6 (A) 08/29/2017   BUN 19 08/29/2017   NA 140 08/29/2017   K 4.3 08/29/2017   CL 104 04/04/2017   CO2 22 04/04/2017        Assessment & Plan:  Insulin-requiring type 2 DM, with polyneuropathy Memory loss: in this setting, she is not a candidate for multiple daily injections Renal insuff: in this setting, she needs a fast-acting qd insulin mixture.    Patient Instructions  Please reduce the insulin to 40 units with breakfast.   Please continue the same trulicity.   Here is a new meter.  I have sent a prescription to your pharmacy, for strips.   check your blood sugar twice a day.  vary the time of day when you check, between before the 3 meals, and at bedtime.  also check if you have symptoms of your blood sugar being too high or too low.  please keep a record of the readings and bring it to your next appointment here (or you can bring the meter itself).  You can write it on any piece of paper.  please call us sooner if your blood sugar goes below 70, or if you have a lot of readings over 200.   Please come back for a follow-up appointment in 3 months.

## 2017-12-22 NOTE — Patient Instructions (Addendum)
Please reduce the insulin to 40 units with breakfast.   Please continue the same trulicity.   Here is a new meter.  I have sent a prescription to your pharmacy, for strips.   check your blood sugar twice a day.  vary the time of day when you check, between before the 3 meals, and at bedtime.  also check if you have symptoms of your blood sugar being too high or too low.  please keep a record of the readings and bring it to your next appointment here (or you can bring the meter itself).  You can write it on any piece of paper.  please call us sooner if your blood sugar goes below 70, or if you have a lot of readings over 200.   Please come back for a follow-up appointment in 3 months.

## 2017-12-26 ENCOUNTER — Telehealth: Payer: Self-pay | Admitting: Endocrinology

## 2017-12-26 ENCOUNTER — Telehealth: Payer: Self-pay

## 2017-12-26 ENCOUNTER — Other Ambulatory Visit: Payer: Self-pay

## 2017-12-26 MED ORDER — FREESTYLE LANCETS MISC: 12 refills | Status: AC

## 2017-12-26 MED ORDER — GLUCOSE BLOOD VI STRP: ORAL_STRIP | 12 refills | Status: DC

## 2017-12-26 MED ORDER — FREESTYLE LITE DEVI: 0 refills | Status: AC

## 2017-12-26 NOTE — Telephone Encounter (Signed)
Received notification from [employer] indicating One Touch Verio is not covered by pt insurance. Per Dr. Loanne Drilling, any covered device, strips and lancets would be appropriate to order. Discussed covered meters, lancets and test strips with pt. Requested Rx for Freestyle be sent to [facility] and Freestyle test strips and lancets be sent to [facility]. Sent today as requested.

## 2017-12-26 NOTE — Telephone Encounter (Signed)
Patients husband called stating he wanted to know how many times his wife needs to use her test srtips and if they need to bring the meter to her next appointment. Please Advise.

## 2017-12-26 NOTE — Telephone Encounter (Signed)
Called pt and informed to monitor CBG's qid and may bring CBG log rather than meter. Verbalized acceptance and understanding.

## 2018-01-09 DIAGNOSIS — J3089 Other allergic rhinitis: Secondary | ICD-10-CM | POA: Diagnosis not present

## 2018-01-23 DIAGNOSIS — J3089 Other allergic rhinitis: Secondary | ICD-10-CM | POA: Diagnosis not present

## 2018-01-24 ENCOUNTER — Ambulatory Visit
Admission: RE | Admit: 2018-01-24 | Discharge: 2018-01-24 | Disposition: A | Discharge status: Home / Self Care | Payer: Medicare Other | Source: Ambulatory Visit | Attending: Internal Medicine | Admitting: Internal Medicine

## 2018-01-24 DIAGNOSIS — Z1231 Encounter for screening mammogram for malignant neoplasm of breast: Secondary | ICD-10-CM

## 2018-02-07 ENCOUNTER — Other Ambulatory Visit: Payer: Self-pay | Admitting: Internal Medicine

## 2018-02-07 DIAGNOSIS — J3089 Other allergic rhinitis: Secondary | ICD-10-CM | POA: Diagnosis not present

## 2018-02-18 ENCOUNTER — Other Ambulatory Visit: Payer: Self-pay | Admitting: Internal Medicine

## 2018-02-23 DIAGNOSIS — J3089 Other allergic rhinitis: Secondary | ICD-10-CM | POA: Diagnosis not present

## 2018-03-06 DIAGNOSIS — J3089 Other allergic rhinitis: Secondary | ICD-10-CM | POA: Diagnosis not present

## 2018-03-14 ENCOUNTER — Ambulatory Visit: Payer: Medicare Other | Admitting: Internal Medicine

## 2018-03-23 ENCOUNTER — Ambulatory Visit (INDEPENDENT_AMBULATORY_CARE_PROVIDER_SITE_OTHER): Payer: Medicare Other | Admitting: Endocrinology

## 2018-03-23 ENCOUNTER — Encounter: Payer: Self-pay | Admitting: Endocrinology

## 2018-03-23 VITALS — BP 110/60 | HR 82 | Ht 64.75 in | Wt 220.8 lb

## 2018-03-23 DIAGNOSIS — J3089 Other allergic rhinitis: Secondary | ICD-10-CM | POA: Diagnosis not present

## 2018-03-23 DIAGNOSIS — E119 Type 2 diabetes mellitus without complications: Secondary | ICD-10-CM | POA: Diagnosis not present

## 2018-03-23 LAB — POCT GLYCOSYLATED HEMOGLOBIN (HGB A1C): Hemoglobin A1C: 6.6 % — AB (ref 4.0–5.6)

## 2018-03-23 MED ORDER — INSULIN LISPRO PROT & LISPRO (75-25 MIX) 100 UNIT/ML KWIKPEN: 35.0000 [IU] | PEN_INJECTOR | Freq: Every day | SUBCUTANEOUS | 5 refills | Status: DC

## 2018-03-23 NOTE — Patient Instructions (Addendum)
Please reduce the insulin to 35 units with breakfast.   Please continue the same trulicity.   Here is a new meter.  I have sent a prescription to your pharmacy, for strips.   check your blood sugar twice a day.  vary the time of day when you check, between before the 3 meals, and at bedtime.  also check if you have symptoms of your blood sugar being too high or too low.  please keep a record of the readings and bring it to your next appointment here (or you can bring the meter itself).  You can write it on any piece of paper.  please call us sooner if your blood sugar goes below 70, or if you have a lot of readings over 200.   Please come back for a follow-up appointment in 3 months.

## 2018-03-23 NOTE — Progress Notes (Signed)
Subjective:    Patient ID: Angel Green, female    DOB: Aug 22, 1940, 78 y.o.   MRN: [id number]  HPI Pt returns for f/u of diabetes mellitus: DM type: Insulin-requiring type 2 Dx'ed: 8502 Complications: polyneuropathy, and renal insufficiency.   Therapy: insulin since 7741, and trulicity.   GDM: never.  DKA: never.   Severe hypoglycemia: once, in 2019.   Pancreatitis: never.  Other: she changed to QAM premixed insulin, after poor results with multiple daily injections; pattern of cbg's indicates she does not need a PM dose; rx is limited by memory loss; husband sets pen to # of units, and pt administers.   Interval history:  Pt takes 40 units qam.  She does not check cbg's.  pt states she feels well in general, except for fatigue.   Past Medical History:  Diagnosis Date  . Allergy    Dust Mites  . Ankle pain    chronic  . Diabetes mellitus, type 2 ([place])   . Hyperlipidemia   . Hypertension   . Memory loss   . Osteoarthritis   . RENAL INSUFFICIENCY, CHRONIC 04/16/2010  . Rhinitis    allergic nos    Past Surgical History:  Procedure Laterality Date  . CATARACT EXTRACTION    . CHOLECYSTECTOMY    . dental implants    . PARTIAL HYSTERECTOMY     in her 71s    Social History   Socioeconomic History  . Marital status: Married    Spouse name: Not on file  . Number of children: 2  . Years of education: some college  . Highest education level: Not on file  Occupational History  . Occupation:  retired  Scientific laboratory technician  . Financial resource strain: Not on file  . Food insecurity:    Worry: Not on file    Inability: Not on file  . Transportation needs:    Medical: Not on file    Non-medical: Not on file  Tobacco Use  . Smoking status: Never Smoker  . Smokeless tobacco: Never Used  Substance and Sexual Activity  . Alcohol use: No    Alcohol/week: 0.0 standard drinks  . Drug use: No  . Sexual activity: Yes    Partners: Male  Lifestyle  . Physical activity:    Days  per week: Not on file    Minutes per session: Not on file  . Stress: Not on file  Relationships  . Social connections:    Talks on phone: Not on file    Gets together: Not on file    Attends religious service: Not on file    Active member of club or organization: Not on file    Attends meetings of clubs or organizations: Not on file    Relationship status: Not on file  . Intimate partner violence:    Fear of current or ex partner: Not on file    Emotionally abused: Not on file    Physically abused: Not on file    Forced sexual activity: Not on file  Other Topics Concern  . Not on file  Social History Narrative   Husband is a Company secretary.   Right-handed.   1 cup caffeine daily.   Lives at home with husband.           Current Outpatient Medications on File Prior to Visit  Medication Sig Dispense Refill  . amLODipine (NORVASC) 5 MG tablet Take 1 tablet (5 mg total) by mouth daily. 90 tablet 1  . aspirin  81 MG tablet Take 81 mg by mouth daily.      Marland Kitchen atorvastatin (LIPITOR) 10 MG tablet Take 1 tablet (10 mg total) by mouth daily. 90 tablet 1  . azelastine (ASTELIN) 0.1 % nasal spray Place 2 sprays into both nostrils at bedtime as needed for rhinitis. Use in each nostril as directed 90 mL 1  . Blood Glucose Monitoring Suppl (FREESTYLE LITE) DEVI Use to monitor your blood sugars 1 each 0  . cromolyn (NASALCROM) 5.2 MG/ACT nasal spray Place 1 spray into both nostrils 4 (four) times daily. 39 mL 1  . Cyanocobalamin (B-12 PO) Take 1 tablet by mouth daily.     Marland Kitchen donepezil (ARICEPT) 10 MG tablet Take 1 tablet (10 mg total) by mouth at bedtime. 90 tablet 4  . Dulaglutide (TRULICITY) 1.5 MW/4.1LK SOPN Inject 1.5 mg into the skin once a week. 12 pen 3  . EPINEPHrine 0.3 mg/0.3 mL IJ SOAJ injection Inject 0.3 mg into the muscle once. Reported on 06/30/2015    . fluticasone (FLONASE) 50 MCG/ACT nasal spray USE 1 TO 2 SPRAYS IN EACH NOSTRIL DAILY 16 g 3  . fluticasone (FLOVENT HFA) 44 MCG/ACT  inhaler Inhale 2 puffs into the lungs 2 (two) times daily. 3 Inhaler 3  . furosemide (LASIX) 20 MG tablet Take 2 tablets (40 mg total) by mouth daily. 180 tablet 1  . glucose blood (FREESTYLE LITE) test strip Use to monitor your blood sugars 4 times per day, once before each meal and once at bedtime 400 each 12  . Lancets (FREESTYLE) lancets Use to monitor blood sugars 4 times per day, once before each meal and once at bedtime 400 each 12  . loratadine (CLARITIN) 10 MG tablet Take 1 tablet (10 mg total) by mouth daily. 90 tablet 3  . losartan (COZAAR) 50 MG tablet Take 1 tablet (50 mg total) by mouth daily. 90 tablet 1  . memantine (NAMENDA) 10 MG tablet Take 1 tablet (10 mg total) by mouth 2 (two) times daily. Take 1 tablet by mouth daily for 7 days, then 1 tablet by mouth twice daily 180 tablet 4  . Multiple Vitamins-Minerals (CENTRUM SILVER PO) Take 1 tablet by mouth daily.      Marland Kitchen omeprazole (PRILOSEC) 20 MG capsule Take 1 capsule (20 mg total) by mouth daily. 90 capsule 3  . Polyethyl Glycol-Propyl Glycol (SYSTANE) 0.4-0.3 % SOLN Apply 1 drop to eye daily as needed (for dry eyes).    . SURE COMFORT PEN NEEDLES 31G X 5 MM MISC Use to inject insulin 1 time per day. 100 each 2   No current facility-administered medications on file prior to visit.     Allergies  Allergen Reactions  . Dust Mite Mixed Allergen Ext [Mite (D. Farinae)]     Family History  Problem Relation Age of Onset  . Healthy Brother   . Healthy Son   . Healthy Son   . Other Mother        unsure of history - "old age"  . Other Father        unsure of history - "old age"  . Heart attack Neg Hx   . Colon cancer Neg Hx   . Breast cancer Neg Hx   . Stroke Neg Hx     BP 110/60 (BP Location: Right Arm, Patient Position: Sitting, Cuff Size: Large)   Pulse 82   Ht 5' 4.75" (1.645 m)   Wt 220 lb 12.8 oz (100.2 kg)   SpO2  94%   BMI 37.03 kg/m    Review of Systems She denies hypoglycemia.      Objective:    Physical Exam VITAL SIGNS:  See vs page GENERAL: no distress Pulses: dorsalis pedis intact bilat.   MSK: no deformity of the feet CV: no leg edema Skin:  no ulcer on the feet.  normal color and temp on the feet. Neuro: sensation is intact to touch on the feet   Lab Results  Component Value Date   CREATININE 1.6 (A) 08/29/2017   BUN 19 08/29/2017   NA 140 08/29/2017   K 4.3 08/29/2017   CL 104 04/04/2017   CO2 22 04/04/2017     Lab Results  Component Value Date   HGBA1C 6.6 (A) 03/23/2018       Assessment & Plan:  Insulin-requiring type 2 DM, with PN: overcontrolled Renal insuff: he does not need a PM insulin dose Noncompliance with cbg recording.  Patient Instructions  Please reduce the insulin to 35 units with breakfast.   Please continue the same trulicity.   Here is a new meter.  I have sent a prescription to your pharmacy, for strips.   check your blood sugar twice a day.  vary the time of day when you check, between before the 3 meals, and at bedtime.  also check if you have symptoms of your blood sugar being too high or too low.  please keep a record of the readings and bring it to your next appointment here (or you can bring the meter itself).  You can write it on any piece of paper.  please call us sooner if your blood sugar goes below 70, or if you have a lot of readings over 200.   Please come back for a follow-up appointment in 3 months.

## 2018-03-24 ENCOUNTER — Ambulatory Visit (INDEPENDENT_AMBULATORY_CARE_PROVIDER_SITE_OTHER): Payer: Medicare Other | Admitting: Internal Medicine

## 2018-03-24 ENCOUNTER — Encounter: Payer: Self-pay | Admitting: Internal Medicine

## 2018-03-24 VITALS — BP 126/64 | HR 77 | Temp 97.8°F | Resp 16 | Ht 64.75 in | Wt 221.1 lb

## 2018-03-24 DIAGNOSIS — I10 Essential (primary) hypertension: Secondary | ICD-10-CM | POA: Diagnosis not present

## 2018-03-24 DIAGNOSIS — E785 Hyperlipidemia, unspecified: Secondary | ICD-10-CM | POA: Diagnosis not present

## 2018-03-24 DIAGNOSIS — Z23 Encounter for immunization: Secondary | ICD-10-CM

## 2018-03-24 DIAGNOSIS — F0391 Unspecified dementia with behavioral disturbance: Secondary | ICD-10-CM

## 2018-03-24 LAB — BASIC METABOLIC PANEL
BUN: 13 mg/dL (ref 6–23)
CO2: 29 mEq/L (ref 19–32)
CREATININE: 1.44 mg/dL — AB (ref 0.40–1.20)
Calcium: 9.3 mg/dL (ref 8.4–10.5)
Chloride: 101 mEq/L (ref 96–112)
GFR: 42.38 mL/min — ABNORMAL LOW (ref >60.00)
Glucose, Bld: 146 mg/dL — ABNORMAL HIGH (ref 70–99)
Potassium: 3.8 mEq/L (ref 3.5–5.1)
Sodium: 139 mEq/L (ref 135–145)

## 2018-03-24 LAB — LIPID PANEL
Cholesterol: 124 mg/dL (ref 0–200)
HDL: 41.1 mg/dL (ref >39.00)
LDL Cholesterol: 64 mg/dL (ref 0–99)
NonHDL: 82.72
Total CHOL/HDL Ratio: 3
Triglycerides: 96 mg/dL (ref 0.0–149.0)
VLDL: 19.2 mg/dL (ref 0.0–40.0)

## 2018-03-24 MED ORDER — ZOSTER VAC RECOMB ADJUVANTED 50 MCG/0.5ML IM SUSR: 0.5000 mL | Freq: Once | INTRAMUSCULAR | 1 refills | Status: AC

## 2018-03-24 MED ORDER — TETANUS-DIPHTH-ACELL PERTUSSIS 5-2.5-18.5 LF-MCG/0.5 IM SUSP: 0.5000 mL | Freq: Once | INTRAMUSCULAR | 0 refills | Status: AC

## 2018-03-24 NOTE — Patient Instructions (Addendum)
Per our records you are due for an eye exam. Please contact your eye doctor to schedule an appointment. Please have them send copies of your office visit notes to [place]. Our fax number is [phone].      GO TO THE LAB : Get the blood work     GO TO THE FRONT DESK Schedule your next appointment   Check up 6 months    Check the  blood pressure 2 or 3 times a month   Be sure your blood pressure is between 110/65 and  135/85. If it is consistently higher or lower, let me know

## 2018-03-24 NOTE — Progress Notes (Signed)
sPre visit review using our clinic review tool, if applicable. No additional management support is needed unless otherwise documented below in the visit note.

## 2018-03-24 NOTE — Progress Notes (Signed)
Subjective:    Patient ID: Angel Green, female    DOB: 1940-06-21, 78 y.o.   MRN: [id number]  DOS:  03/24/2018 Type of visit - description: Routine visit, here with her husband HTN: Good compliance with medication, ambulatory BPs when checked are normal Dementia: Good medication compliance.  No apparent side effects. Skin lesions, face: one  of them is getting larger. DM: Note from Dr. Loanne Drilling reviewed.  Review of Systems Memory stable/better according to husband No behavioral issues No lower extremity edema.  Past Medical History:  Diagnosis Date  . Allergy    Dust Mites  . Ankle pain    chronic  . Diabetes mellitus, type 2 ([place])   . Hyperlipidemia   . Hypertension   . Memory loss   . Osteoarthritis   . RENAL INSUFFICIENCY, CHRONIC 04/16/2010  . Rhinitis    allergic nos    Past Surgical History:  Procedure Laterality Date  . CATARACT EXTRACTION    . CHOLECYSTECTOMY    . dental implants    . PARTIAL HYSTERECTOMY     in her 53s    Social History   Socioeconomic History  . Marital status: Married    Spouse name: Not on file  . Number of children: 2  . Years of education: some college  . Highest education level: Not on file  Occupational History  . Occupation:  retired  Scientific laboratory technician  . Financial resource strain: Not on file  . Food insecurity:    Worry: Not on file    Inability: Not on file  . Transportation needs:    Medical: Not on file    Non-medical: Not on file  Tobacco Use  . Smoking status: Never Smoker  . Smokeless tobacco: Never Used  Substance and Sexual Activity  . Alcohol use: No    Alcohol/week: 0.0 standard drinks  . Drug use: No  . Sexual activity: Yes    Partners: Male  Lifestyle  . Physical activity:    Days per week: Not on file    Minutes per session: Not on file  . Stress: Not on file  Relationships  . Social connections:    Talks on phone: Not on file    Gets together: Not on file    Attends religious service: Not on  file    Active member of club or organization: Not on file    Attends meetings of clubs or organizations: Not on file    Relationship status: Not on file  . Intimate partner violence:    Fear of current or ex partner: Not on file    Emotionally abused: Not on file    Physically abused: Not on file    Forced sexual activity: Not on file  Other Topics Concern  . Not on file  Social History Narrative   Husband is a Company secretary.   Right-handed.   1 cup caffeine daily.   Lives at home with husband.             Allergies as of 03/24/2018      Reactions   Dust Mite Mixed Allergen Ext [mite (d. Farinae)]       Medication List       Accurate as of March 24, 2018  9:21 AM. Always use your most recent med list.        amLODipine 5 MG tablet Commonly known as:  NORVASC Take 1 tablet (5 mg total) by mouth daily.   aspirin 81 MG tablet Take 81  mg by mouth daily.   atorvastatin 10 MG tablet Commonly known as:  LIPITOR Take 1 tablet (10 mg total) by mouth daily.   azelastine 0.1 % nasal spray Commonly known as:  ASTELIN Place 2 sprays into both nostrils at bedtime as needed for rhinitis. Use in each nostril as directed   B-12 PO Take 1 tablet by mouth daily.   CENTRUM SILVER PO Take 1 tablet by mouth daily.   cromolyn 5.2 MG/ACT nasal spray Commonly known as:  NASALCROM Place 1 spray into both nostrils 4 (four) times daily.   donepezil 10 MG tablet Commonly known as:  ARICEPT Take 1 tablet (10 mg total) by mouth at bedtime.   Dulaglutide 1.5 MG/0.5ML Sopn Commonly known as:  TRULICITY Inject 1.5 mg into the skin once a week.   EPINEPHrine 0.3 mg/0.3 mL Soaj injection Commonly known as:  EPI-PEN Inject 0.3 mg into the muscle once. Reported on 06/30/2015   fluticasone 44 MCG/ACT inhaler Commonly known as:  FLOVENT HFA Inhale 2 puffs into the lungs 2 (two) times daily.   fluticasone 50 MCG/ACT nasal spray Commonly known as:  FLONASE USE 1 TO 2 SPRAYS IN EACH  NOSTRIL DAILY   freestyle lancets Use to monitor blood sugars 4 times per day, once before each meal and once at bedtime   FREESTYLE LITE Devi Use to monitor your blood sugars   furosemide 20 MG tablet Commonly known as:  LASIX Take 2 tablets (40 mg total) by mouth daily.   glucose blood test strip Commonly known as:  FREESTYLE LITE Use to monitor your blood sugars 4 times per day, once before each meal and once at bedtime   Insulin Lispro Prot & Lispro (75-25) 100 UNIT/ML Kwikpen Commonly known as:  HUMALOG MIX 75/25 KWIKPEN Inject 35 Units into the skin daily with breakfast.   loratadine 10 MG tablet Commonly known as:  CLARITIN Take 1 tablet (10 mg total) by mouth daily.   losartan 50 MG tablet Commonly known as:  COZAAR Take 1 tablet (50 mg total) by mouth daily.   memantine 10 MG tablet Commonly known as:  NAMENDA Take 1 tablet (10 mg total) by mouth 2 (two) times daily. Take 1 tablet by mouth daily for 7 days, then 1 tablet by mouth twice daily   omeprazole 20 MG capsule Commonly known as:  PRILOSEC Take 1 capsule (20 mg total) by mouth daily.   SURE COMFORT PEN NEEDLES 31G X 5 MM Misc Generic drug:  Insulin Pen Needle Use to inject insulin 1 time per day.   SYSTANE 0.4-0.3 % Soln Generic drug:  Polyethyl Glycol-Propyl Glycol Apply 1 drop to eye daily as needed (for dry eyes).           Objective:   Physical Exam BP 126/64 (BP Location: Right Arm, Patient Position: Sitting, Cuff Size: Normal)   Pulse 77   Temp 97.8 F (36.6 C) (Oral)   Resp 16   Ht 5' 4.75" (1.645 m)   Wt 221 lb 2 oz (100.3 kg)   SpO2 97%   BMI 37.08 kg/m  General:   Well developed, NAD, BMI noted. HEENT:  Normocephalic . Face symmetric, atraumatic Lungs:  CTA B Normal respiratory effort, no intercostal retractions, no accessory muscle use. Heart: RRR,  no murmur.  No pretibial edema bilaterally  Skin: 2 lesions at the chin previously described are still there, the one on  the left seems to be bigger. Neurologic:  alert & oriented to self.  No formal neurological exam done today.  Pleasant, cooperative. Speech normal, gait appropriate for age and unassisted Psych--  No anxious or depressed appearing.      Assessment      Assessment   DM  Dr Loanne Drilling + Neuropathy: Per foot exam 12-2014 HTN ---change ACE to ARB is 03/2014 Hyperlipidemia CRI  dx 2012  Sees Dr Arty Baumgartner  Morbid obesity DJD, had  chronic ankle pain Allergies -- dust mites , occ uses a inhaler , sees allergist, has shots q weeks started ~ 08-2014 Dementia:  MMSE 7/218: 24, rx Aricept.  B12, RPR, sed rate and TSH normal MMSE 07/2017 :13 worse MRI brain 6/19:  prominent right temporal lobe volume loss.  PLAN: DM: Follow-up by Dr. Loanne Drilling, last visit yesterday, A1c was 6.6. HTN: Continue with amlodipine, Lasix, losartan.  Check a BMP Hyperlipidemia: Continue with Lipitor.  Last LFTs normal, check a FLP Dementia: On Aricept and Namenda, according to the pt's husband she is doing very well with no behavioral issues. Skin lesions: Saw dermatology, DX with "PN".  One of the lesions is getting worse, recommend to contact dermatology. Preventive care: PNM 23 today.  Prescription printed for Tdap and Shingrix.  Benefits discussed. RTC 6 months.

## 2018-03-25 NOTE — Assessment & Plan Note (Signed)
DM: Follow-up by Dr. Loanne Drilling, last visit yesterday, A1c was 6.6. HTN: Continue with amlodipine, Lasix, losartan.  Check a BMP Hyperlipidemia: Continue with Lipitor.  Last LFTs normal, check a FLP Dementia: On Aricept and Namenda, according to the pt's husband she is doing very well with no behavioral issues. Skin lesions: Saw dermatology, DX with "PN".  One of the lesions is getting worse, recommend to contact dermatology. Preventive care: PNM 23 today.  Prescription printed for Tdap and Shingrix.  Benefits discussed. RTC 6 months.

## 2018-04-05 DIAGNOSIS — J3089 Other allergic rhinitis: Secondary | ICD-10-CM | POA: Diagnosis not present

## 2018-04-11 DIAGNOSIS — L281 Prurigo nodularis: Secondary | ICD-10-CM | POA: Diagnosis not present

## 2018-04-18 DIAGNOSIS — J3089 Other allergic rhinitis: Secondary | ICD-10-CM | POA: Diagnosis not present

## 2018-04-20 ENCOUNTER — Encounter: Payer: Self-pay | Admitting: Neurology

## 2018-04-20 ENCOUNTER — Ambulatory Visit (INDEPENDENT_AMBULATORY_CARE_PROVIDER_SITE_OTHER): Payer: Medicare Other | Admitting: Neurology

## 2018-04-20 VITALS — BP 138/67 | HR 70 | Ht 64.75 in | Wt 221.0 lb

## 2018-04-20 DIAGNOSIS — F0391 Unspecified dementia with behavioral disturbance: Secondary | ICD-10-CM

## 2018-04-20 NOTE — Progress Notes (Signed)
PATIENT: Angel Green DOB: 06/26/1940  Chief Complaint  Patient presents with  . Memory Loss    MMSE 21/30 - 10 animals.  She is here with her husband, [name].  Feels she has good and bad with her memory.  She has continued taking both donepezil and memantine.      HISTORICAL  Angel Green is a 78 year old female, accompanied by her husband [name], seen in request by her primary care Dr. Larose Kells, [facility] for evaluation of memory loss, initial evaluation was on October 18, 2017  She had past medical history of hypertension, her husband is a retired Company secretary, she lives at home with her husband, was noted to have gradual onset memory loss since 2011, her husband retired as a Company secretary, she was found hard to keep up details, her memory loss gradually getting worse, she got lost driving recently, forget people's name, missed the doctor's appointment,  She also become more sedentary, today's Mini-Mental status examination is 19 out of 30,   Both of her parents suffered significant memory loss in the elderly age  Personally reviewed MRI of the brain in June 2019, mild generalized atrophy, more at right temporal lobe, there was no acute abnormality,  Laboratory evaluations, A1c 6.1, normal liver functional tests, CBC, iron panel, BMP showed abnormal liver functional test with creatinine of 1.6,  July 2018 laboratory evaluation showed normal or negative ESR, RPR, V89, folic acid, TSH  UPDATE Apr 20 2018: She is sedentary, taking aricept 110m qhs, namenda 150mbid, her memory overall is stable.  REVIEW OF SYSTEMS: Full 14 system review of systems performed and notable only for runny nose   ALLERGIES: Allergies  Allergen Reactions  . Dust Mite Mixed Allergen Ext [Mite (D. Farinae)]     HOME MEDICATIONS: Current Outpatient Medications  Medication Sig Dispense Refill  . amLODipine (NORVASC) 5 MG tablet Take 1 tablet (5 mg total) by mouth daily. 90 tablet 1  . aspirin 81 MG tablet Take 81  mg by mouth daily.      . Marland Kitchentorvastatin (LIPITOR) 10 MG tablet Take 1 tablet (10 mg total) by mouth daily. 90 tablet 1  . azelastine (ASTELIN) 0.1 % nasal spray Place 2 sprays into both nostrils at bedtime as needed for rhinitis. Use in each nostril as directed 90 mL 1  . Blood Glucose Monitoring Suppl (FREESTYLE LITE) DEVI Use to monitor your blood sugars 1 each 0  . cromolyn (NASALCROM) 5.2 MG/ACT nasal spray Place 1 spray into both nostrils 4 (four) times daily. 39 mL 1  . Cyanocobalamin (B-12 PO) Take 1 tablet by mouth daily.     . Marland Kitchenonepezil (ARICEPT) 10 MG tablet Take 1 tablet (10 mg total) by mouth at bedtime. 90 tablet 4  . Dulaglutide (TRULICITY) 1.5 MGFY/1.0FBOPN Inject 1.5 mg into the skin once a week. 12 pen 3  . EPINEPHrine 0.3 mg/0.3 mL IJ SOAJ injection Inject 0.3 mg into the muscle once. Reported on 06/30/2015    . fluticasone (FLONASE) 50 MCG/ACT nasal spray USE 1 TO 2 SPRAYS IN EACH NOSTRIL DAILY 16 g 3  . fluticasone (FLOVENT HFA) 44 MCG/ACT inhaler Inhale 2 puffs into the lungs 2 (two) times daily. 3 Inhaler 3  . furosemide (LASIX) 20 MG tablet Take 2 tablets (40 mg total) by mouth daily. 180 tablet 1  . glucose blood (FREESTYLE LITE) test strip Use to monitor your blood sugars 4 times per day, once before each meal and once at bedtime 400 each 12  .  Insulin Lispro Prot & Lispro (HUMALOG MIX 75/25 KWIKPEN) (75-25) 100 UNIT/ML Kwikpen Inject 35 Units into the skin daily with breakfast. 60 mL 5  . Lancets (FREESTYLE) lancets Use to monitor blood sugars 4 times per day, once before each meal and once at bedtime 400 each 12  . loratadine (CLARITIN) 10 MG tablet Take 1 tablet (10 mg total) by mouth daily. 90 tablet 3  . losartan (COZAAR) 50 MG tablet Take 1 tablet (50 mg total) by mouth daily. 90 tablet 1  . memantine (NAMENDA) 10 MG tablet Take 1 tablet (10 mg total) by mouth 2 (two) times daily. Take 1 tablet by mouth daily for 7 days, then 1 tablet by mouth twice daily 180 tablet 4  .  Multiple Vitamins-Minerals (CENTRUM SILVER PO) Take 1 tablet by mouth daily.      Marland Kitchen omeprazole (PRILOSEC) 20 MG capsule Take 1 capsule (20 mg total) by mouth daily. 90 capsule 3  . Polyethyl Glycol-Propyl Glycol (SYSTANE) 0.4-0.3 % SOLN Apply 1 drop to eye daily as needed (for dry eyes).    . SURE COMFORT PEN NEEDLES 31G X 5 MM MISC Use to inject insulin 1 time per day. 100 each 2   No current facility-administered medications for this visit.     PAST MEDICAL HISTORY: Past Medical History:  Diagnosis Date  . Allergy    Dust Mites  . Ankle pain    chronic  . Diabetes mellitus, type 2 ([place])   . Hyperlipidemia   . Hypertension   . Memory loss   . Osteoarthritis   . RENAL INSUFFICIENCY, CHRONIC 04/16/2010  . Rhinitis    allergic nos    PAST SURGICAL HISTORY: Past Surgical History:  Procedure Laterality Date  . CATARACT EXTRACTION    . CHOLECYSTECTOMY    . dental implants    . PARTIAL HYSTERECTOMY     in her 71s    FAMILY HISTORY: Family History  Problem Relation Age of Onset  . Healthy Brother   . Healthy Son   . Healthy Son   . Other Mother        unsure of history - "old age"  . Other Father        unsure of history - "old age"  . Heart attack Neg Hx   . Colon cancer Neg Hx   . Breast cancer Neg Hx   . Stroke Neg Hx     SOCIAL HISTORY: Social History   Socioeconomic History  . Marital status: Married    Spouse name: Not on file  . Number of children: 2  . Years of education: some college  . Highest education level: Not on file  Occupational History  . Occupation:  retired  Scientific laboratory technician  . Financial resource strain: Not on file  . Food insecurity:    Worry: Not on file    Inability: Not on file  . Transportation needs:    Medical: Not on file    Non-medical: Not on file  Tobacco Use  . Smoking status: Never Smoker  . Smokeless tobacco: Never Used  Substance and Sexual Activity  . Alcohol use: No    Alcohol/week: 0.0 standard drinks  . Drug use:  No  . Sexual activity: Yes    Partners: Male  Lifestyle  . Physical activity:    Days per week: Not on file    Minutes per session: Not on file  . Stress: Not on file  Relationships  . Social connections:  Talks on phone: Not on file    Gets together: Not on file    Attends religious service: Not on file    Active member of club or organization: Not on file    Attends meetings of clubs or organizations: Not on file    Relationship status: Not on file  . Intimate partner violence:    Fear of current or ex partner: Not on file    Emotionally abused: Not on file    Physically abused: Not on file    Forced sexual activity: Not on file  Other Topics Concern  . Not on file  Social History Narrative   Husband is a Company secretary.   Right-handed.   1 cup caffeine daily.   Lives at home with husband.            PHYSICAL EXAM   Vitals:   04/20/18 1328  BP: 138/67  Pulse: 70  Weight: 221 lb (100.2 kg)  Height: 5' 4.75" (1.645 m)    Not recorded      Body mass index is 37.06 kg/m.  PHYSICAL EXAMNIATION:  Gen: NAD, conversant, well nourised, obese, well groomed                     Cardiovascular: Regular rate rhythm, no peripheral edema, warm, nontender. Eyes: Conjunctivae clear without exudates or hemorrhage Neck: Supple, no carotid bruits. Pulmonary: Clear to auscultation bilaterally   NEUROLOGICAL EXAM:  MENTAL STATUS: MMSE - Mini Mental State Exam 04/20/2018 11/11/2017 10/18/2017  Orientation to time _0 Orientation to Place _1 Registration _2 Attention/ Calculation _3 Recall 0 3 0  Language- name 2 objects _4 Language- repeat _5 Language- follow 3 step command 3 0 3  Language- read & follow direction _6 Write a sentence _7 Copy design 1 0 1  Total score _8 animal naming 10  CRANIAL NERVES: CN II: Visual fields are full to confrontation.   Pupils are round equal and briskly reactive to light. CN III, IV, VI:  extraocular movement are normal. No ptosis. CN V: Facial sensation is intact to pinprick in all 3 divisions bilaterally. Corneal responses are intact.  CN VII: Face is symmetric with normal eye closure and smile. CN VIII: Hearing is normal to rubbing fingers CN IX, X: Palate elevates symmetrically. Phonation is normal. CN XI: Head turning and shoulder shrug are intact CN XII: Tongue is midline with normal movements and no atrophy.  MOTOR: There is no pronator drift of out-stretched arms. Muscle bulk and tone are normal. Muscle strength is normal.  REFLEXES: Reflexes are 2+ and symmetric at the biceps, triceps, knees, and ankles. Plantar responses are flexor.  SENSORY: Intact to light touch, pinprick, positional sensation and vibratory sensation are intact in fingers and toes.  COORDINATION: Rapid alternating movements and fine finger movements are intact. There is no dysmetria on finger-to-nose and heel-knee-shin.    GAIT/STANCE: She needs pushed up to get up from seated position, fairly steady Romberg is absent.   DIAGNOSTIC DATA (LABS, IMAGING, TESTING) - I reviewed patient records, labs, notes, testing and imaging myself where available.   ASSESSMENT AND PLAN  Samreet Donley is a 78 y.o. female   Dementia without behavior issues  No treatable etiology found  Most consistent with central nervous system degenerative disorder, family history of dementia  keep Namenda to 87  mg twice a day, Aricept 10 mg daily  Encouraged to continue moderate exercise  Marcial Pacas, M.D. Ph.D.  [facility] [address], [place], [place] [address] Ph: [phone] Fax: [phone]  CC: Colon Branch, MD

## 2018-04-24 DIAGNOSIS — N2581 Secondary hyperparathyroidism of renal origin: Secondary | ICD-10-CM | POA: Diagnosis not present

## 2018-04-24 DIAGNOSIS — E785 Hyperlipidemia, unspecified: Secondary | ICD-10-CM | POA: Diagnosis not present

## 2018-04-24 DIAGNOSIS — F0390 Unspecified dementia without behavioral disturbance: Secondary | ICD-10-CM | POA: Diagnosis not present

## 2018-04-24 DIAGNOSIS — D631 Anemia in chronic kidney disease: Secondary | ICD-10-CM | POA: Diagnosis not present

## 2018-04-24 DIAGNOSIS — I10 Essential (primary) hypertension: Secondary | ICD-10-CM | POA: Diagnosis not present

## 2018-04-24 DIAGNOSIS — E669 Obesity, unspecified: Secondary | ICD-10-CM | POA: Diagnosis not present

## 2018-04-24 DIAGNOSIS — I129 Hypertensive chronic kidney disease with stage 1 through stage 4 chronic kidney disease, or unspecified chronic kidney disease: Secondary | ICD-10-CM | POA: Diagnosis not present

## 2018-04-24 DIAGNOSIS — N183 Chronic kidney disease, stage 3 (moderate): Secondary | ICD-10-CM | POA: Diagnosis not present

## 2018-04-24 DIAGNOSIS — E1122 Type 2 diabetes mellitus with diabetic chronic kidney disease: Secondary | ICD-10-CM | POA: Diagnosis not present

## 2018-05-02 DIAGNOSIS — J3089 Other allergic rhinitis: Secondary | ICD-10-CM | POA: Diagnosis not present

## 2018-05-09 DIAGNOSIS — J3089 Other allergic rhinitis: Secondary | ICD-10-CM | POA: Diagnosis not present

## 2018-05-16 DIAGNOSIS — J3081 Allergic rhinitis due to animal (cat) (dog) hair and dander: Secondary | ICD-10-CM | POA: Diagnosis not present

## 2018-05-16 DIAGNOSIS — J3089 Other allergic rhinitis: Secondary | ICD-10-CM | POA: Diagnosis not present

## 2018-05-31 DIAGNOSIS — J3089 Other allergic rhinitis: Secondary | ICD-10-CM | POA: Diagnosis not present

## 2018-06-13 DIAGNOSIS — J3089 Other allergic rhinitis: Secondary | ICD-10-CM | POA: Diagnosis not present

## 2018-06-13 DIAGNOSIS — J3081 Allergic rhinitis due to animal (cat) (dog) hair and dander: Secondary | ICD-10-CM | POA: Diagnosis not present

## 2018-06-19 ENCOUNTER — Other Ambulatory Visit: Payer: Self-pay | Admitting: Internal Medicine

## 2018-06-22 ENCOUNTER — Ambulatory Visit (INDEPENDENT_AMBULATORY_CARE_PROVIDER_SITE_OTHER): Payer: Medicare Other | Admitting: Endocrinology

## 2018-06-22 ENCOUNTER — Other Ambulatory Visit: Payer: Self-pay

## 2018-06-22 ENCOUNTER — Encounter: Payer: Self-pay | Admitting: Endocrinology

## 2018-06-22 VITALS — BP 136/64 | HR 92 | Temp 98.1°F | Wt 225.4 lb

## 2018-06-22 DIAGNOSIS — Z794 Long term (current) use of insulin: Secondary | ICD-10-CM

## 2018-06-22 DIAGNOSIS — R413 Other amnesia: Secondary | ICD-10-CM | POA: Diagnosis not present

## 2018-06-22 DIAGNOSIS — E119 Type 2 diabetes mellitus without complications: Secondary | ICD-10-CM | POA: Diagnosis not present

## 2018-06-22 DIAGNOSIS — E1140 Type 2 diabetes mellitus with diabetic neuropathy, unspecified: Secondary | ICD-10-CM

## 2018-06-22 LAB — POCT GLYCOSYLATED HEMOGLOBIN (HGB A1C): Hemoglobin A1C: 6.9 % — AB (ref 4.0–5.6)

## 2018-06-22 MED ORDER — INSULIN LISPRO PROT & LISPRO (75-25 MIX) 100 UNIT/ML KWIKPEN: 32.0000 [IU] | PEN_INJECTOR | Freq: Every day | SUBCUTANEOUS | 5 refills | Status: DC

## 2018-06-22 NOTE — Patient Instructions (Addendum)
Please reduce the insulin to 32 units with breakfast.   Please continue the same trulicity.     check your blood sugar twice a day.  vary the time of day when you check, between before the 3 meals, and at bedtime.  also check if you have symptoms of your blood sugar being too high or too low.  please keep a record of the readings and bring it to your next appointment here (or you can bring the meter itself).  You can write it on any piece of paper.  please call us sooner if your blood sugar goes below 70, or if you have a lot of readings over 200.   Please come back for a follow-up appointment in 3-4 months.

## 2018-06-22 NOTE — Progress Notes (Signed)
Subjective:    Patient ID: Angel Green, female    DOB: [birth year], 78 y.o.   MRN: [id number]  HPI Pt returns for f/u of diabetes mellitus: DM type: Insulin-requiring type 2.   Dx'ed: 2458 Complications: polyneuropathy, and renal insufficiency.   Therapy: insulin since 0998, and trulicity.   GDM: never.  DKA: never.   Severe hypoglycemia: once, in 2019.   Pancreatitis: never.  Other: she changed to QAM premixed insulin, after poor results with multiple daily injections; pattern of cbg's indicates she does not need a PM dose; rx is limited by memory loss; husband sets pen to # of units, and pt administers.   Interval history:  Pt takes 35 units qam.  Meter is downloaded today, and the printout is scanned into the record.  cbg varies from 128-331.  It is in general higher as the day goes on.  No new sxs. Past Medical History:  Diagnosis Date  . Allergy    Dust Mites  . Ankle pain    chronic  . Diabetes mellitus, type 2 ([place])   . Hyperlipidemia   . Hypertension   . Memory loss   . Osteoarthritis   . RENAL INSUFFICIENCY, CHRONIC 04/16/2010  . Rhinitis    allergic nos    Past Surgical History:  Procedure Laterality Date  . CATARACT EXTRACTION    . CHOLECYSTECTOMY    . dental implants    . PARTIAL HYSTERECTOMY     in her 72s    Social History   Socioeconomic History  . Marital status: Married    Spouse name: Not on file  . Number of children: 2  . Years of education: some college  . Highest education level: Not on file  Occupational History  . Occupation:  retired  Scientific laboratory technician  . Financial resource strain: Not on file  . Food insecurity:    Worry: Not on file    Inability: Not on file  . Transportation needs:    Medical: Not on file    Non-medical: Not on file  Tobacco Use  . Smoking status: Never Smoker  . Smokeless tobacco: Never Used  Substance and Sexual Activity  . Alcohol use: No    Alcohol/week: 0.0 standard drinks  . Drug use: No  . Sexual  activity: Yes    Partners: Male  Lifestyle  . Physical activity:    Days per week: Not on file    Minutes per session: Not on file  . Stress: Not on file  Relationships  . Social connections:    Talks on phone: Not on file    Gets together: Not on file    Attends religious service: Not on file    Active member of club or organization: Not on file    Attends meetings of clubs or organizations: Not on file    Relationship status: Not on file  . Intimate partner violence:    Fear of current or ex partner: Not on file    Emotionally abused: Not on file    Physically abused: Not on file    Forced sexual activity: Not on file  Other Topics Concern  . Not on file  Social History Narrative   Husband is a Company secretary.   Right-handed.   1 cup caffeine daily.   Lives at home with husband.           Current Outpatient Medications on File Prior to Visit  Medication Sig Dispense Refill  . amLODipine (NORVASC) 5 MG  tablet Take 1 tablet (5 mg total) by mouth daily. 90 tablet 1  . aspirin 81 MG tablet Take 81 mg by mouth daily.      Marland Kitchen atorvastatin (LIPITOR) 10 MG tablet Take 1 tablet (10 mg total) by mouth daily. 90 tablet 1  . azelastine (ASTELIN) 0.1 % nasal spray Place 2 sprays into both nostrils at bedtime as needed for rhinitis. Use in each nostril as directed 90 mL 1  . Blood Glucose Monitoring Suppl (FREESTYLE LITE) DEVI Use to monitor your blood sugars 1 each 0  . cromolyn (NASALCROM) 5.2 MG/ACT nasal spray Place 1 spray into both nostrils 4 (four) times daily. 39 mL 1  . Cyanocobalamin (B-12 PO) Take 1 tablet by mouth daily.     Marland Kitchen donepezil (ARICEPT) 10 MG tablet Take 1 tablet (10 mg total) by mouth at bedtime. 90 tablet 4  . Dulaglutide (TRULICITY) 1.5 KK/9.3GH SOPN Inject 1.5 mg into the skin once a week. 12 pen 3  . EPINEPHrine 0.3 mg/0.3 mL IJ SOAJ injection Inject 0.3 mg into the muscle once. Reported on 06/30/2015    . fluticasone (FLONASE) 50 MCG/ACT nasal spray USE 1 TO 2 SPRAYS  IN EACH NOSTRIL DAILY 16 g 3  . fluticasone (FLOVENT HFA) 44 MCG/ACT inhaler Inhale 2 puffs into the lungs 2 (two) times daily. 3 Inhaler 3  . furosemide (LASIX) 20 MG tablet Take 2 tablets (40 mg total) by mouth daily. 180 tablet 1  . glucose blood (FREESTYLE LITE) test strip Use to monitor your blood sugars 4 times per day, once before each meal and once at bedtime 400 each 12  . Lancets (FREESTYLE) lancets Use to monitor blood sugars 4 times per day, once before each meal and once at bedtime 400 each 12  . loratadine (CLARITIN) 10 MG tablet Take 1 tablet (10 mg total) by mouth daily. 90 tablet 3  . losartan (COZAAR) 50 MG tablet Take 1 tablet (50 mg total) by mouth daily. 90 tablet 1  . memantine (NAMENDA) 10 MG tablet Take 1 tablet (10 mg total) by mouth 2 (two) times daily. Take 1 tablet by mouth daily for 7 days, then 1 tablet by mouth twice daily 180 tablet 4  . Multiple Vitamins-Minerals (CENTRUM SILVER PO) Take 1 tablet by mouth daily.      Marland Kitchen omeprazole (PRILOSEC) 20 MG capsule Take 1 capsule (20 mg total) by mouth daily. 90 capsule 3  . Polyethyl Glycol-Propyl Glycol (SYSTANE) 0.4-0.3 % SOLN Apply 1 drop to eye daily as needed (for dry eyes).    . SURE COMFORT PEN NEEDLES 31G X 5 MM MISC Use to inject insulin 1 time per day. 100 each 2   No current facility-administered medications on file prior to visit.     Allergies  Allergen Reactions  . Dust Mite Mixed Allergen Ext [Mite (D. Farinae)]     Family History  Problem Relation Age of Onset  . Healthy Brother   . Healthy Son   . Healthy Son   . Other Mother        unsure of history - "old age"  . Other Father        unsure of history - "old age"  . Heart attack Neg Hx   . Colon cancer Neg Hx   . Breast cancer Neg Hx   . Stroke Neg Hx     BP 136/64 (BP Location: Left Arm, Patient Position: Sitting, Cuff Size: Large)   Pulse 92   Temp  98.1 F (36.7 C) (Oral)   Wt 225 lb 6.4 oz (102.2 kg)   SpO2 92%   BMI 37.80 kg/m     Review of Systems She denies hypoglycemia.      Objective:   Physical Exam VITAL SIGNS:  See vs page GENERAL: no distress Pulses: dorsalis pedis intact bilat.   MSK: no deformity of the feet CV: no leg edema Skin:  no ulcer on the feet.  normal color and temp on the feet. Neuro: sensation is intact to touch on the feet  Lab Results  Component Value Date   CREATININE 1.44 (H) 03/24/2018   BUN 13 03/24/2018   NA 139 03/24/2018   K 3.8 03/24/2018   CL 101 03/24/2018   CO2 29 03/24/2018       Assessment & Plan:  Insulin-requiring type 2 DM, with PN: overcontrolled Memory loss: in this setting, she is not a candidate for aggressive glycemic control Renal insuff: in this setting, she may need a faster-acting qd insulin, but we'll try reducing the same insulin for now.    Patient Instructions  Please reduce the insulin to 32 units with breakfast.   Please continue the same trulicity.     check your blood sugar twice a day.  vary the time of day when you check, between before the 3 meals, and at bedtime.  also check if you have symptoms of your blood sugar being too high or too low.  please keep a record of the readings and bring it to your next appointment here (or you can bring the meter itself).  You can write it on any piece of paper.  please call us sooner if your blood sugar goes below 70, or if you have a lot of readings over 200.   Please come back for a follow-up appointment in 3-4 months.

## 2018-06-27 DIAGNOSIS — J3089 Other allergic rhinitis: Secondary | ICD-10-CM | POA: Diagnosis not present

## 2018-06-29 DIAGNOSIS — J3089 Other allergic rhinitis: Secondary | ICD-10-CM | POA: Diagnosis not present

## 2018-07-03 DIAGNOSIS — J3089 Other allergic rhinitis: Secondary | ICD-10-CM | POA: Diagnosis not present

## 2018-07-05 DIAGNOSIS — J3089 Other allergic rhinitis: Secondary | ICD-10-CM | POA: Diagnosis not present

## 2018-07-06 ENCOUNTER — Ambulatory Visit (INDEPENDENT_AMBULATORY_CARE_PROVIDER_SITE_OTHER): Payer: Medicare Other | Admitting: Neurology

## 2018-07-06 ENCOUNTER — Telehealth: Payer: Self-pay | Admitting: *Deleted

## 2018-07-06 ENCOUNTER — Encounter: Payer: Self-pay | Admitting: Neurology

## 2018-07-06 DIAGNOSIS — F0391 Unspecified dementia with behavioral disturbance: Secondary | ICD-10-CM | POA: Diagnosis not present

## 2018-07-06 MED ORDER — QUETIAPINE FUMARATE 25 MG PO TABS: 25.0000 mg | ORAL_TABLET | Freq: Every day | ORAL | 11 refills | Status: DC

## 2018-07-06 MED ORDER — MEMANTINE HCL 10 MG PO TABS: 10.0000 mg | ORAL_TABLET | Freq: Two times a day (BID) | ORAL | 4 refills | Status: DC

## 2018-07-06 MED ORDER — DONEPEZIL HCL 10 MG PO TABS: 10.0000 mg | ORAL_TABLET | Freq: Every day | ORAL | 4 refills | Status: DC

## 2018-07-06 NOTE — Telephone Encounter (Signed)
I called and left the patient's husband a message requesting him to return my call.  Please offer them a video or telephone visit today at 4pm with Dr. Krista Blue when he calls back.

## 2018-07-06 NOTE — Telephone Encounter (Signed)
Angel Green, patients husband, stopped by the office.  Says he really needs help.  Patient is having hallucinations. Seeing people. Saying she sees him in the yard with a woman.  She is not sleeping.  The language is terrible.  If you need to prescribe her something it needs to go to [facility] on [address].  Please call.

## 2018-07-06 NOTE — Telephone Encounter (Signed)
I returned the call to Angel Green.  His wife has been added to the schedule for today at 2:30pm.

## 2018-07-06 NOTE — Telephone Encounter (Signed)
Mr Angel Green came back by the office.  States he has been trying to call and cant get an answer.  Ronald Pippins was outside greeting patients and spoke to him.  He would like the Telephone Visit at 4:00 today please.

## 2018-07-06 NOTE — Progress Notes (Signed)
    PATIENT: Angel Green DOB: August 04, 1940  No chief complaint on file.    HISTORICAL  Angel Green is a 78 year old female, accompanied by her husband Angel Green, seen in request by [name], Angel Green for evaluation of memory loss, initial evaluation was on October 18, 2017  She had past medical history of hypertension, her husband is a retired Company secretary, she lives at home with her husband, was noted to have gradual onset memory loss since 2011, her husband retired as a Company secretary, she was found hard to keep up details, her memory loss gradually getting worse, she got lost driving recently, forget people's name, missed the doctor's appointment,  She also become more sedentary, today's Mini-Mental status examination is 19 out of 30,   Both of her parents suffered significant memory loss in the elderly age  Personally reviewed MRI of the brain in June 2019, mild generalized atrophy, more at right temporal lobe, there was no acute abnormality,  Laboratory evaluations, A1c 6.1, normal liver functional tests, CBC, iron panel, BMP showed abnormal liver functional test with creatinine of 1.6,  July 2018 laboratory evaluation showed normal or negative ESR, RPR, Y05, folic acid, TSH  UPDATE Apr 20 2018: She is sedentary, taking aricept '10mg'$  qhs, namenda '10mg'$  bid, her memory overall is stable.  Virtual Visit via telephone  I connected with Angel Green on 07/06/18 at  by telephone and verified that I am speaking with the correct person using two identifiers.   I discussed the limitations, risks, security and privacy concerns of performing an evaluation and management service by telephone and the availability of in person appointments. I also discussed with the patient that there may be a patient responsible charge related to this service. The patient expressed understanding and agreed to proceed.   History of Present Illness: I was able to talk with her husband, patient has  worsening memory loss, is more forgetful, has visual hallucinations, agitated easily, denies difficulty moving, has good appetite, is taking Namenda 10 mg twice a day, Aricept 10 mg daily  Observations/Objective: I have reviewed problem lists, medications, allergies. Awake alert  Assessment and Plan: Dementia with agitation  Strong family history of dementia, most consistent with central nervous system degenerative disorders,  No treatable etiology found,  Keep Namenda 10 mg twice a day, Aricept 10 mg daily,  Add on Seroquel 25 mg every night  Follow Up Instructions:  In 6 months with Angel Green    I discussed the assessment and treatment plan with the patient. The patient was provided an opportunity to ask questions and all were answered. The patient agreed with the plan and demonstrated an understanding of the instructions.   The patient was advised to call back or seek an in-person evaluation if the symptoms worsen or if the condition fails to improve as anticipated.  I provided 25 minutes of non-face-to-face time during this encounter.   Marcial Pacas, MD

## 2018-07-19 DIAGNOSIS — K219 Gastro-esophageal reflux disease without esophagitis: Secondary | ICD-10-CM | POA: Diagnosis not present

## 2018-07-19 DIAGNOSIS — J301 Allergic rhinitis due to pollen: Secondary | ICD-10-CM | POA: Diagnosis not present

## 2018-07-19 DIAGNOSIS — R05 Cough: Secondary | ICD-10-CM | POA: Diagnosis not present

## 2018-07-19 DIAGNOSIS — J3089 Other allergic rhinitis: Secondary | ICD-10-CM | POA: Diagnosis not present

## 2018-08-04 DIAGNOSIS — D485 Neoplasm of uncertain behavior of skin: Secondary | ICD-10-CM | POA: Diagnosis not present

## 2018-08-04 DIAGNOSIS — L820 Inflamed seborrheic keratosis: Secondary | ICD-10-CM | POA: Diagnosis not present

## 2018-08-06 ENCOUNTER — Other Ambulatory Visit: Payer: Self-pay | Admitting: Internal Medicine

## 2018-08-09 DIAGNOSIS — J3089 Other allergic rhinitis: Secondary | ICD-10-CM | POA: Diagnosis not present

## 2018-08-15 ENCOUNTER — Other Ambulatory Visit: Payer: Self-pay

## 2018-08-15 ENCOUNTER — Ambulatory Visit (INDEPENDENT_AMBULATORY_CARE_PROVIDER_SITE_OTHER): Payer: Medicare Other | Admitting: Internal Medicine

## 2018-08-15 DIAGNOSIS — F0391 Unspecified dementia with behavioral disturbance: Secondary | ICD-10-CM

## 2018-08-15 DIAGNOSIS — I10 Essential (primary) hypertension: Secondary | ICD-10-CM

## 2018-08-15 DIAGNOSIS — E785 Hyperlipidemia, unspecified: Secondary | ICD-10-CM

## 2018-08-15 DIAGNOSIS — R209 Unspecified disturbances of skin sensation: Secondary | ICD-10-CM

## 2018-08-15 NOTE — Progress Notes (Signed)
Subjective:    Patient ID: Angel Green, female    DOB: May 19, 1940, 78 y.o.   MRN: [id number]  DOS:  08/15/2018 Type of visit - description: Attempted  to make this a video visit, due to technical difficulties from the patient side it was not possible  thus we proceeded with a Virtual Visit via Telephone    I connected with@ on 08/16/18 at 10:00 AM EDT by telephone and verified that I am speaking with the correct person using two identifiers.  THIS ENCOUNTER IS A VIRTUAL VISIT DUE TO COVID-19 - PATIENT WAS NOT SEEN IN THE OFFICE. PATIENT HAS CONSENTED TO VIRTUAL VISIT / TELEMEDICINE VISIT   Location of patient: home  Location of provider: office  I discussed the limitations, risks, security and privacy concerns of performing an evaluation and management service by telephone and the availability of in person appointments. I also discussed with the patient that there may be a patient responsible charge related to this service. The patient expressed understanding and agreed to proceed.   History of Present Illness: Follow-up. Most of the information was obtained from her spouse Angel Green because of history of dementia At the time the appointment was set up, they reported "chills".   On further questioning what he means by chills is that "her feet are cold all the time".  Specifically denies fever, cough, UTI type of symptoms or claudication. Dementia: Unable to see note from neurology HTN: Good compliance with medication, no recent ambulatory BPs.   Review of Systems No chest pain no difficulty breathing No nausea, vomiting, diarrhea No blood in the stools. Mentally, the patient's spouse reports that she is doing well/stable.  Past Medical History:  Diagnosis Date  . Allergy    Dust Mites  . Ankle pain    chronic  . Diabetes mellitus, type 2 ([place])   . Hyperlipidemia   . Hypertension   . Memory loss   . Osteoarthritis   . RENAL INSUFFICIENCY, CHRONIC 04/16/2010  . Rhinitis    allergic nos    Past Surgical History:  Procedure Laterality Date  . CATARACT EXTRACTION    . CHOLECYSTECTOMY    . dental implants    . PARTIAL HYSTERECTOMY     in her 90s    Social History   Socioeconomic History  . Marital status: Married    Spouse name: Not on file  . Number of children: 2  . Years of education: some college  . Highest education level: Not on file  Occupational History  . Occupation:  retired  Scientific laboratory technician  . Financial resource strain: Not on file  . Food insecurity    Worry: Not on file    Inability: Not on file  . Transportation needs    Medical: Not on file    Non-medical: Not on file  Tobacco Use  . Smoking status: Never Smoker  . Smokeless tobacco: Never Used  Substance and Sexual Activity  . Alcohol use: No    Alcohol/week: 0.0 standard drinks  . Drug use: No  . Sexual activity: Yes    Partners: Male  Lifestyle  . Physical activity    Days per week: Not on file    Minutes per session: Not on file  . Stress: Not on file  Relationships  . Social Herbalist on phone: Not on file    Gets together: Not on file    Attends religious service: Not on file    Active member of club  or organization: Not on file    Attends meetings of clubs or organizations: Not on file    Relationship status: Not on file  . Intimate partner violence    Fear of current or ex partner: Not on file    Emotionally abused: Not on file    Physically abused: Not on file    Forced sexual activity: Not on file  Other Topics Concern  . Not on file  Social History Narrative   Husband is a Company secretary.   Right-handed.   1 cup caffeine daily.   Lives at home with husband.             Allergies as of 08/15/2018      Reactions   Dust Mite Mixed Allergen Ext [mite (d. Farinae)]       Medication List       Accurate as of August 15, 2018 11:59 PM. If you have any questions, ask your nurse or doctor.        amLODipine 5 MG tablet Commonly known as:  NORVASC Take 1 tablet (5 mg total) by mouth daily.   aspirin 81 MG tablet Take 81 mg by mouth daily.   atorvastatin 10 MG tablet Commonly known as: LIPITOR Take 1 tablet (10 mg total) by mouth daily.   azelastine 0.1 % nasal spray Commonly known as: ASTELIN Place 2 sprays into both nostrils at bedtime as needed for rhinitis. Use in each nostril as directed   B-12 PO Take 1 tablet by mouth daily.   CENTRUM SILVER PO Take 1 tablet by mouth daily.   cromolyn 5.2 MG/ACT nasal spray Commonly known as: NasalCrom Place 1 spray into both nostrils 4 (four) times daily.   donepezil 10 MG tablet Commonly known as: ARICEPT Take 1 tablet (10 mg total) by mouth at bedtime.   Dulaglutide 1.5 MG/0.5ML Sopn Commonly known as: Trulicity Inject 1.5 mg into the skin once a week.   EPINEPHrine 0.3 mg/0.3 mL Soaj injection Commonly known as: EPI-PEN Inject 0.3 mg into the muscle once. Reported on 06/30/2015   fluticasone 44 MCG/ACT inhaler Commonly known as: Flovent HFA Inhale 2 puffs into the lungs 2 (two) times daily.   fluticasone 50 MCG/ACT nasal spray Commonly known as: FLONASE USE 1 TO 2 SPRAYS IN EACH NOSTRIL DAILY   freestyle lancets Use to monitor blood sugars 4 times per day, once before each meal and once at bedtime   FreeStyle Lite Devi Use to monitor your blood sugars   furosemide 20 MG tablet Commonly known as: LASIX Take 2 tablets (40 mg total) by mouth daily.   glucose blood test strip Commonly known as: FREESTYLE LITE Use to monitor your blood sugars 4 times per day, once before each meal and once at bedtime   Insulin Lispro Prot & Lispro (75-25) 100 UNIT/ML Kwikpen Commonly known as: HumaLOG Mix 75/25 KwikPen Inject 32 Units into the skin daily with breakfast.   loratadine 10 MG tablet Commonly known as: CLARITIN Take 1 tablet (10 mg total) by mouth daily.   losartan 50 MG tablet Commonly known as: COZAAR Take 1 tablet (50 mg total) by mouth daily.    memantine 10 MG tablet Commonly known as: NAMENDA Take 1 tablet (10 mg total) by mouth 2 (two) times daily. Take 1 tablet by mouth daily for 7 days, then 1 tablet by mouth twice daily   omeprazole 20 MG capsule Commonly known as: PRILOSEC Take 1 capsule (20 mg total) by mouth daily.  QUEtiapine 25 MG tablet Commonly known as: SEROquel Take 1 tablet (25 mg total) by mouth at bedtime.   Sure Comfort Pen Needles 31G X 5 MM Misc Generic drug: Insulin Pen Needle Use to inject insulin 1 time per day.   Systane 0.4-0.3 % Soln Generic drug: Polyethyl Glycol-Propyl Glycol Apply 1 drop to eye daily as needed (for dry eyes).           Objective:   Physical Exam There were no vitals taken for this visit.     This is a telephone virtual visit, the patient is alert, cooperative, no distress, very pleasant.  Most of the information was obtained from her husband.  Assessment     Assessment   DM  Dr Loanne Drilling + Neuropathy: Per foot exam 12-2014 HTN ---change ACE to ARB is 03/2014 Hyperlipidemia CRI  dx 2012  Sees Dr Arty Baumgartner  Morbid obesity DJD, had  chronic ankle pain Allergies -- dust mites , occ uses a inhaler , sees allergist, has shots q weeks started ~ 08-2014 Dementia:  MMSE 7/218: 24, rx Aricept.  B12, RPR, sed rate and TSH normal MMSE 07/2017 :13 worse MRI brain 6/19:  prominent right temporal lobe volume loss.  PLAN HTN: Currently on losartan and amlodipine, check a CMP.  Also a CBC Hyperlipidemia: Continue atorvastatin Dementia: Unable to see neurology visit (marked as confidential), I notice she is on Seroquel.  The patient's husband reports that she is doing well. "Chills": No actual fever, she just has "feet cold".  Recommend observation for now, will check a TSH Plan: Labs in few days RTC 4 months.  Will arrange.          I discussed the assessment and treatment plan with the patient. The patient was provided an opportunity to ask questions and all were answered.  The patient agreed with the plan and demonstrated an understanding of the instructions.   The patient was advised to call back or seek an in-person evaluation if the symptoms worsen or if the condition fails to improve as anticipated.  I provided 22 minutes of non-face-to-face time during this encounter.  Kathlene November, MD

## 2018-08-16 NOTE — Assessment & Plan Note (Signed)
HTN: Currently on losartan and amlodipine, check a CMP.  Also a CBC Hyperlipidemia: Continue atorvastatin Dementia: Unable to see neurology visit (marked as confidential), I notice she is on Seroquel.  The patient's husband reports that she is doing well. "Chills": No actual fever, she just has "feet cold".  Recommend observation for now, will check a TSH Plan: Labs in few days RTC 4 months.  Will arrange.

## 2018-08-31 DIAGNOSIS — J3089 Other allergic rhinitis: Secondary | ICD-10-CM | POA: Diagnosis not present

## 2018-09-13 ENCOUNTER — Encounter: Payer: Self-pay | Admitting: Internal Medicine

## 2018-09-13 ENCOUNTER — Other Ambulatory Visit: Payer: Self-pay

## 2018-09-13 ENCOUNTER — Ambulatory Visit (INDEPENDENT_AMBULATORY_CARE_PROVIDER_SITE_OTHER): Payer: Medicare Other | Admitting: Internal Medicine

## 2018-09-13 VITALS — BP 138/63 | HR 100 | Temp 98.0°F | Resp 16 | Ht 64.75 in | Wt 227.4 lb

## 2018-09-13 DIAGNOSIS — R269 Unspecified abnormalities of gait and mobility: Secondary | ICD-10-CM

## 2018-09-13 DIAGNOSIS — F0391 Unspecified dementia with behavioral disturbance: Secondary | ICD-10-CM

## 2018-09-13 DIAGNOSIS — E785 Hyperlipidemia, unspecified: Secondary | ICD-10-CM

## 2018-09-13 DIAGNOSIS — I10 Essential (primary) hypertension: Secondary | ICD-10-CM

## 2018-09-13 DIAGNOSIS — E119 Type 2 diabetes mellitus without complications: Secondary | ICD-10-CM

## 2018-09-13 LAB — CBC WITH DIFFERENTIAL/PLATELET
Basophils Absolute: 0 10*3/uL (ref 0.0–0.1)
Basophils Relative: 0.6 % (ref 0.0–3.0)
Eosinophils Absolute: 0.2 10*3/uL (ref 0.0–0.7)
Eosinophils Relative: 2.9 % (ref 0.0–5.0)
HCT: 41.7 % (ref 36.0–46.0)
Hemoglobin: 13.6 g/dL (ref 12.0–15.0)
Lymphocytes Relative: 25.1 % (ref 12.0–46.0)
Lymphs Abs: 1.9 10*3/uL (ref 0.7–4.0)
MCHC: 32.7 g/dL (ref 30.0–36.0)
MCV: 97 fl (ref 78.0–100.0)
Monocytes Absolute: 0.6 10*3/uL (ref 0.1–1.0)
Monocytes Relative: 7.3 % (ref 3.0–12.0)
Neutro Abs: 4.9 10*3/uL (ref 1.4–7.7)
Neutrophils Relative %: 64.1 % (ref 43.0–77.0)
Platelets: 214 10*3/uL (ref 150.0–400.0)
RBC: 4.3 Mil/uL (ref 3.87–5.11)
RDW: 14.6 % (ref 11.5–15.5)
WBC: 7.7 10*3/uL (ref 4.0–10.5)

## 2018-09-13 LAB — BASIC METABOLIC PANEL
BUN: 17 mg/dL (ref 6–23)
CO2: 32 mEq/L (ref 19–32)
Calcium: 9.5 mg/dL (ref 8.4–10.5)
Chloride: 102 mEq/L (ref 96–112)
Creatinine, Ser: 1.37 mg/dL — ABNORMAL HIGH (ref 0.40–1.20)
GFR: 44.83 mL/min — ABNORMAL LOW (ref >60.00)
Glucose, Bld: 269 mg/dL — ABNORMAL HIGH (ref 70–99)
Potassium: 4.1 mEq/L (ref 3.5–5.1)
Sodium: 141 mEq/L (ref 135–145)

## 2018-09-13 NOTE — Progress Notes (Signed)
Subjective:    Patient ID: Angel Green, female    DOB: [birth year], 78 y.o.   MRN: [id number]  DOS:  09/13/2018 Type of visit - description: Routine office visit Here with her husband No major concerns.  The patient expressed frustration because she realizes her physical and mental capabilities are diminished   Review of Systems Denies fever chills No chest pain no difficulty breathing No nausea, vomiting, diarrhea Past Medical History:  Diagnosis Date  . Allergy    Dust Mites  . Ankle pain    chronic  . Diabetes mellitus, type 2 ([place])   . Hyperlipidemia   . Hypertension   . Memory loss   . Osteoarthritis   . RENAL INSUFFICIENCY, CHRONIC 04/16/2010  . Rhinitis    allergic nos    Past Surgical History:  Procedure Laterality Date  . CATARACT EXTRACTION    . CHOLECYSTECTOMY    . dental implants    . PARTIAL HYSTERECTOMY     in her 80s    Social History   Socioeconomic History  . Marital status: Married    Spouse name: Not on file  . Number of children: 2  . Years of education: some college  . Highest education level: Not on file  Occupational History  . Occupation:  retired  Scientific laboratory technician  . Financial resource strain: Not on file  . Food insecurity    Worry: Not on file    Inability: Not on file  . Transportation needs    Medical: Not on file    Non-medical: Not on file  Tobacco Use  . Smoking status: Never Smoker  . Smokeless tobacco: Never Used  Substance and Sexual Activity  . Alcohol use: No    Alcohol/week: 0.0 standard drinks  . Drug use: No  . Sexual activity: Yes    Partners: Male  Lifestyle  . Physical activity    Days per week: Not on file    Minutes per session: Not on file  . Stress: Not on file  Relationships  . Social Herbalist on phone: Not on file    Gets together: Not on file    Attends religious service: Not on file    Active member of club or organization: Not on file    Attends meetings of clubs or  organizations: Not on file    Relationship status: Not on file  . Intimate partner violence    Fear of current or ex partner: Not on file    Emotionally abused: Not on file    Physically abused: Not on file    Forced sexual activity: Not on file  Other Topics Concern  . Not on file  Social History Narrative   Husband is a Company secretary.   Right-handed.   1 cup caffeine daily.   Lives at home with husband.             Allergies as of 09/13/2018      Reactions   Dust Mite Mixed Allergen Ext [mite (d. Farinae)]       Medication List       Accurate as of September 13, 2018 11:59 PM. If you have any questions, ask your nurse or doctor.        amLODipine 5 MG tablet Commonly known as: NORVASC Take 1 tablet (5 mg total) by mouth daily.   aspirin 81 MG tablet Take 81 mg by mouth daily.   atorvastatin 10 MG tablet Commonly known as: LIPITOR Take  1 tablet (10 mg total) by mouth daily.   azelastine 0.1 % nasal spray Commonly known as: ASTELIN Place 2 sprays into both nostrils at bedtime as needed for rhinitis. Use in each nostril as directed   B-12 PO Take 1 tablet by mouth daily.   CENTRUM SILVER PO Take 1 tablet by mouth daily.   cromolyn 5.2 MG/ACT nasal spray Commonly known as: NasalCrom Place 1 spray into both nostrils 4 (four) times daily.   donepezil 10 MG tablet Commonly known as: ARICEPT Take 1 tablet (10 mg total) by mouth at bedtime.   Dulaglutide 1.5 MG/0.5ML Sopn Commonly known as: Trulicity Inject 1.5 mg into the skin once a week.   EPINEPHrine 0.3 mg/0.3 mL Soaj injection Commonly known as: EPI-PEN Inject 0.3 mg into the muscle once. Reported on 06/30/2015   fluticasone 44 MCG/ACT inhaler Commonly known as: Flovent HFA Inhale 2 puffs into the lungs 2 (two) times daily.   fluticasone 50 MCG/ACT nasal spray Commonly known as: FLONASE USE 1 TO 2 SPRAYS IN EACH NOSTRIL DAILY   freestyle lancets Use to monitor blood sugars 4 times per day, once before  each meal and once at bedtime   FreeStyle Lite Devi Use to monitor your blood sugars   furosemide 20 MG tablet Commonly known as: LASIX Take 2 tablets (40 mg total) by mouth daily.   glucose blood test strip Commonly known as: FREESTYLE LITE Use to monitor your blood sugars 4 times per day, once before each meal and once at bedtime   Insulin Lispro Prot & Lispro (75-25) 100 UNIT/ML Kwikpen Commonly known as: HumaLOG Mix 75/25 KwikPen Inject 32 Units into the skin daily with breakfast.   loratadine 10 MG tablet Commonly known as: CLARITIN Take 1 tablet (10 mg total) by mouth daily.   losartan 50 MG tablet Commonly known as: COZAAR Take 1 tablet (50 mg total) by mouth daily.   memantine 10 MG tablet Commonly known as: NAMENDA Take 1 tablet (10 mg total) by mouth 2 (two) times daily. Take 1 tablet by mouth daily for 7 days, then 1 tablet by mouth twice daily   omeprazole 20 MG capsule Commonly known as: PRILOSEC Take 1 capsule (20 mg total) by mouth daily.   QUEtiapine 25 MG tablet Commonly known as: SEROquel Take 1 tablet (25 mg total) by mouth at bedtime.   Sure Comfort Pen Needles 31G X 5 MM Misc Generic drug: Insulin Pen Needle Use to inject insulin 1 time per day.   Systane 0.4-0.3 % Soln Generic drug: Polyethyl Glycol-Propyl Glycol Apply 1 drop to eye daily as needed (for dry eyes).           Objective:   Physical Exam BP 138/63 (BP Location: Left Arm, Patient Position: Sitting, Cuff Size: Small)   Pulse 100   Temp 98 F (36.7 C) (Oral)   Resp 16   Ht 5' 4.75" (1.645 m)   Wt 227 lb 6 oz (103.1 kg)   SpO2 97%   BMI 38.13 kg/m  General:   Well developed, NAD, BMI noted. HEENT:  Normocephalic . Face symmetric, atraumatic Lungs:  CTA B Normal respiratory effort, no intercostal retractions, no accessory muscle use. Heart: RRR,  no murmur.  No pretibial edema bilaterally  Skin: Not pale. Not jaundice Neurologic:  alert & pleasent, cooperative, no  formal memory exam performed today Speech normal, gait unassisted, hard time transferring, needed assistance Psych--   No anxious or depressed appearing.      Assessment  Assessment   DM  Dr Loanne Drilling + Neuropathy: Per foot exam 12-2014 HTN ---change ACE to ARB is 03/2014 Hyperlipidemia CRI  dx 2012  Sees Dr Arty Baumgartner  Morbid obesity DJD, had  chronic ankle pain Allergies -- dust mites , occ uses a inhaler , sees allergist, has shots q weeks started ~ 08-2014 Dementia:  MMSE 7/218: 24, rx Aricept.  B12, RPR, sed rate and TSH normal MMSE 07/2017 :13 worse MRI brain 6/19:  prominent right temporal lobe volume loss.  PLAN HTN: Seems  control.  Continue amlodipine, losartan, Lasix.  Check a BMP and CBC Dementia: Aricept and Namenda, husband is here with her, reports she is at baseline, no behavioral issues.  No change Gait disorder: Had a very hard time transferring, needed assistant, no recent falls.  She reports that she "does not need a cane or a walker".  We eventually agreed to do physical therapy for assessment, help with strength and hopefully she will agree to use either a cane or a walker Flu shot recommended early this year. RTC 4 to 6 months

## 2018-09-13 NOTE — Progress Notes (Signed)
Pre visit review using our clinic review tool, if applicable. No additional management support is needed unless otherwise documented below in the visit note. 

## 2018-09-13 NOTE — Patient Instructions (Addendum)
Per our records you are due for an eye exam. Please contact your eye doctor to schedule an appointment. Please have them send copies of your office visit notes to [place]. Our fax number is [phone].  Please schedule Medicare Wellness with Glenard Haring.    GO TO THE LAB : Get the blood work     GO TO THE FRONT DESK Schedule your next appointment for a checkup in 4 to 6 months  We will refer you to a physical therapist  Please get a flu shot early this season

## 2018-09-14 NOTE — Assessment & Plan Note (Signed)
HTN: Seems  control.  Continue amlodipine, losartan, Lasix.  Check a BMP and CBC Dementia: Aricept and Namenda, husband is here with her, reports she is at baseline, no behavioral issues.  No change Gait disorder: Had a very hard time transferring, needed assistant, no recent falls.  She reports that she "does not need a cane or a walker".  We eventually agreed to do physical therapy for assessment, help with strength and hopefully she will agree to use either a cane or a walker Flu shot recommended early this year. RTC 4 to 6 months

## 2018-09-21 DIAGNOSIS — J3089 Other allergic rhinitis: Secondary | ICD-10-CM | POA: Diagnosis not present

## 2018-09-22 ENCOUNTER — Other Ambulatory Visit: Payer: Self-pay

## 2018-09-22 ENCOUNTER — Ambulatory Visit: Payer: Medicare Other | Attending: Internal Medicine | Admitting: Physical Therapy

## 2018-09-22 ENCOUNTER — Encounter: Payer: Self-pay | Admitting: Physical Therapy

## 2018-09-22 DIAGNOSIS — R2681 Unsteadiness on feet: Secondary | ICD-10-CM

## 2018-09-22 DIAGNOSIS — M6281 Muscle weakness (generalized): Secondary | ICD-10-CM

## 2018-09-22 NOTE — Therapy (Signed)
[place] [address] [place], [place], [address] Phone: [phone]   Fax:  [phone]  Physical Therapy Evaluation  Patient Details  Name: Angel Green MRN: [id number] Date of Birth: Dec 31, 1940 Referring Provider (PT): [name]   Encounter Date: 09/22/2018  PT End of Session - 09/22/18 1214    Visit Number  1    Date for PT Re-Evaluation  11/22/18    PT Start Time  1007    PT Stop Time  1050    PT Time Calculation (min)  43 min    Activity Tolerance  Patient tolerated treatment well    Behavior During Therapy  [facility] for tasks assessed/performed       Past Medical History:  Diagnosis Date  . Allergy    Dust Mites  . Ankle pain    chronic  . Diabetes mellitus, type 2 ([place])   . Hyperlipidemia   . Hypertension   . Memory loss   . Osteoarthritis   . RENAL INSUFFICIENCY, CHRONIC 04/16/2010  . Rhinitis    allergic nos    Past Surgical History:  Procedure Laterality Date  . CATARACT EXTRACTION    . CHOLECYSTECTOMY    . dental implants    . PARTIAL HYSTERECTOMY     in her 30s    There were no vitals filed for this visit.   Subjective Assessment - 09/22/18 1017    Subjective  Patient and husband state that over the past year the wife (patient) has had increased difficulty getting around and walking.  Denies falls.  Reports that they have had to go one step at a time at home.    Limitations  Walking    Patient Stated Goals  be stronger and move better    Currently in Pain?  No/denies         [facility] PT Assessment - 09/22/18 0001      Assessment   Medical Diagnosis  unsteady gait    Referring Provider (PT)  [name]    Onset Date/Surgical Date  08/22/18    Prior Therapy  no      Precautions   Precautions  None      Balance Screen   Has the patient fallen in the past 6 months  No    Has the patient had a decrease in activity level because of a fear of falling?   Yes    Is the patient reluctant to  leave their home because of a fear of falling?   No      Home Environment   Additional Comments  has stairs, reports that she really does not do much      Prior Function   Level of Independence  Independent    Vocation  Retired    Leisure  no exercise      ROM / Strength   AROM / PROM / Strength  AROM;Strength      AROM   Overall AROM Comments  WFL's      Strength   Overall Strength Comments  4-/5 for the LE's, elbows 4+/5, shoulders 4-/5      Standardized Balance Assessment   Standardized Balance Assessment  Berg Balance Test;Timed Up and Go Test      Berg Balance Test   Sit to Stand  Able to stand without using hands and stabilize independently    Standing Unsupported  Able to stand safely 2 minutes    Sitting with Back Unsupported but Feet  Supported on Floor or Stool  Able to sit safely and securely 2 minutes    Stand to Sit  Controls descent by using hands    Transfers  Able to transfer safely, definite need of hands    Standing Unsupported with Eyes Closed  Able to stand 10 seconds with supervision    Standing Unsupported with Feet Together  Able to place feet together independently and stand for 1 minute with supervision    From Standing, Reach Forward with Outstretched Arm  Can reach forward >12 cm safely (5")    From Standing Position, Pick up Object from [place] to pick up shoe safely and easily    From Standing Position, Turn to Look Behind Over each Shoulder  Looks behind one side only/other side shows less weight shift    Turn 360 Degrees  Able to turn 360 degrees safely one side only in 4 seconds or less    Standing Unsupported, Alternately Place Feet on Step/Stool  Able to stand independently and complete 8 steps >20 seconds    Standing Unsupported, One Foot in Front  Able to take small step independently and hold 30 seconds    Standing on One Leg  Tries to lift leg/unable to hold 3 seconds but remains standing independently    Total Score  43      Timed Up  and Go Test   Normal TUG (seconds)  20                Objective measurements completed on examination: See above findings.      [facility] Adult PT Treatment/Exercise - 09/22/18 0001      Exercises   Exercises  Knee/Hip      Knee/Hip Exercises: Aerobic   Nustep  level 4 x 4 minutes             PT Education - 09/22/18 1054    Education Details  standing 4 way kicks    Person(s) Educated  Patient    Methods  Explanation;Demonstration;Handout    Comprehension  Verbalized understanding       PT Short Term Goals - 09/22/18 1216      PT SHORT TERM GOAL #1   Title  independent with initial HEP    Time  2    Period  Weeks    Status  New        PT Long Term Goals - 09/22/18 1216      PT LONG TERM GOAL #1   Title  decrease TUG time to 13 seconds    Time  8    Period  Weeks    Status  New      PT LONG TERM GOAL #2   Title  increase Berg score to 48/56    Time  8    Period  Weeks    Status  New      PT LONG TERM GOAL #3   Title  increase hip strength to 4+/5    Time  8    Period  Weeks    Status  New      PT LONG TERM GOAL #4   Title  independent and safe with husband for advanced HEP for balance    Time  8    Period  Weeks    Status  New             Plan - 09/22/18 1214    Clinical Impression Statement  Patient and husband present,  he reports that she has developed some dementia, they report that over the past year she has had decreased strength, some difficulty with acitvities and unsteady gait.  They report a fall over a year ago.  She has weak hips, TUG was 20 seconds and Berg score was 43/56 putting her at increased risk for falls    Stability/Clinical Decision Making  Stable/Uncomplicated    Clinical Decision Making  Low    Rehab Potential  Good    PT Frequency  2x / week    PT Duration  8 weeks    PT Treatment/Interventions  Gait training;Stair training;Functional mobility training;Therapeutic activities;Patient/family  education;Neuromuscular re-education;Balance training;Therapeutic exercise;Manual techniques    PT Next Visit Plan  start exercises for strength and balance    Consulted and Agree with Plan of Care  Patient       Patient will benefit from skilled therapeutic intervention in order to improve the following deficits and impairments:  Abnormal gait, Cardiopulmonary status limiting activity, Decreased mobility, Decreased strength, Decreased range of motion, Decreased activity tolerance, Decreased endurance, Decreased balance, Difficulty walking  Visit Diagnosis: 1. Muscle weakness (generalized)   2. Unsteadiness on feet        Problem List Patient Active Problem List   Diagnosis Date Noted  . Dementia ([place]) 08/14/2017  . Cognitive impairment 09/22/2016  . Morbid obesity ([place]) 06/30/2015  . Diabetes ([place]) 04/23/2015  . PCP NOTES >>>>> 12/28/2014  . Leg ulcer, left ([place]) 10/12/2013  . Annual physical exam 12/14/2011  . Angioedema 10/22/2011  . Tracheobronchitis, chronic ([place]) 08/20/2010  . Fatigue 05/22/2010  . CKD (chronic kidney disease) 04/16/2010  . GERD 09/30/2008  . EDEMA 06/10/2008  . Hyperlipidemia 01/25/2007  . ANKLE PAIN, CHRONIC 12/08/2006  . Seasonal and perennial allergic rhinitis 10/20/2006  . HTN (hypertension) 08/30/2006  . OSTEOARTHROSIS, GENERALIZED, MULTIPLE SITES 08/30/2006    Sumner Boast., PT 09/22/2018, 12:19 PM  [place] [address] [place], [place], [address] Phone: [phone]   Fax:  [phone]  Name: Angel Green MRN: [id number] Date of Birth: 12-06-1940

## 2018-09-26 ENCOUNTER — Ambulatory Visit: Payer: Medicare Other | Attending: Internal Medicine | Admitting: Physical Therapy

## 2018-09-26 ENCOUNTER — Other Ambulatory Visit: Payer: Self-pay

## 2018-09-26 ENCOUNTER — Encounter: Payer: Self-pay | Admitting: Physical Therapy

## 2018-09-26 DIAGNOSIS — M6281 Muscle weakness (generalized): Secondary | ICD-10-CM | POA: Diagnosis not present

## 2018-09-26 DIAGNOSIS — R2681 Unsteadiness on feet: Secondary | ICD-10-CM | POA: Insufficient documentation

## 2018-09-26 NOTE — Therapy (Signed)
[place], [place], [address] Phone: [phone]   Fax:  [phone]  Physical Therapy Treatment  Patient Details  Name: Angel Green MRN: [id number] Date of Birth: [birth year] Referring Provider (PT): [name]   Encounter Date: 09/26/2018  PT End of Session - 09/26/18 1223    Visit Number  2    Date for PT Re-Evaluation  11/22/18    PT Start Time  6195    PT Stop Time  1225    PT Time Calculation (min)  40 min    Activity Tolerance  Patient tolerated treatment well    Behavior During Therapy  [facility] for tasks assessed/performed       Past Medical History:  Diagnosis Date  . Allergy    Dust Mites  . Ankle pain    chronic  . Diabetes mellitus, type 2 ([place])   . Hyperlipidemia   . Hypertension   . Memory loss   . Osteoarthritis   . RENAL INSUFFICIENCY, CHRONIC 04/16/2010  . Rhinitis    allergic nos    Past Surgical History:  Procedure Laterality Date  . CATARACT EXTRACTION    . CHOLECYSTECTOMY    . dental implants    . PARTIAL HYSTERECTOMY     in her 30s    There were no vitals filed for this visit.  Subjective Assessment - 09/26/18 1147    Subjective  Pt stated that things have been going well.    Patient Stated Goals  be stronger and move better    Currently in Pain?  No/denies                       [facility] Adult PT Treatment/Exercise - 09/26/18 0001      High Level Balance   High Level Balance Activities  Side stepping;Backward walking      Exercises   Exercises  Lumbar      Lumbar Exercises: Seated   Other Seated Lumbar Exercises  OHP with red ball 2x10     Other Seated Lumbar Exercises  ab set with pball 2x10      Knee/Hip Exercises: Aerobic   Nustep  level 4 x 6 minutes      Knee/Hip Exercises: Seated   Long Arc Quad  Both;2 sets;10 reps;Weights    Long Arc Quad Weight  2 lbs.    Ball Squeeze  2x10    Marching  2 sets;Both;10 reps    Marching  Weights  2 lbs.   no weight second set    Hamstring Curl  Strengthening;Both;2 sets;10 reps    Hamstring Limitations  red Tband     Abduction/Adduction   2 sets;Both;10 reps    Abd/Adduction Limitations  red tband                PT Short Term Goals - 09/22/18 1216      PT SHORT TERM GOAL #1   Title  independent with initial HEP    Time  2    Period  Weeks    Status  New        PT Long Term Goals - 09/22/18 1216      PT LONG TERM GOAL #1   Title  decrease TUG time to 13 seconds    Time  8    Period  Weeks    Status  New      PT LONG TERM GOAL #2   Title  increase Berg score to 48/56    Time  8    Period  Weeks    Status  New      PT LONG TERM GOAL #3   Title  increase hip strength to 4+/5    Time  8    Period  Weeks    Status  New      PT LONG TERM GOAL #4   Title  independent and safe with husband for advanced HEP for balance    Time  8    Period  Weeks    Status  New            Plan - 09/26/18 1224    Clinical Impression Statement  Pt tolerated an initial progression to TE well. She report no pain during today's session, Pt does appear to be a little unstable when ambulating around clinic. She reports no falls or stumbles at home. Cues needed for pacing, she wanted to perform the exercises too fast. Tactile and VC's needed to keep hips square with side steps.    Stability/Clinical Decision Making  Stable/Uncomplicated    Rehab Potential  Good    PT Frequency  2x / week    PT Duration  8 weeks    PT Next Visit Plan  start exercises for strength and balance       Patient will benefit from skilled therapeutic intervention in order to improve the following deficits and impairments:  Abnormal gait, Cardiopulmonary status limiting activity, Decreased mobility, Decreased strength, Decreased range of motion, Decreased activity tolerance, Decreased endurance, Decreased balance, Difficulty walking  Visit Diagnosis: 1. Muscle weakness (generalized)    2. Unsteadiness on feet        Problem List Patient Active Problem List   Diagnosis Date Noted  . Dementia ([place]) 08/14/2017  . Cognitive impairment 09/22/2016  . Morbid obesity ([place]) 06/30/2015  . Diabetes ([place]) 04/23/2015  . PCP NOTES >>>>> 12/28/2014  . Leg ulcer, left ([place]) 10/12/2013  . Annual physical exam 12/14/2011  . Angioedema 10/22/2011  . Tracheobronchitis, chronic ([place]) 08/20/2010  . Fatigue 05/22/2010  . CKD (chronic kidney disease) 04/16/2010  . GERD 09/30/2008  . EDEMA 06/10/2008  . Hyperlipidemia 01/25/2007  . ANKLE PAIN, CHRONIC 12/08/2006  . Seasonal and perennial allergic rhinitis 10/20/2006  . HTN (hypertension) 08/30/2006  . OSTEOARTHROSIS, GENERALIZED, MULTIPLE SITES 08/30/2006    Scot Jun, PTA 09/26/2018, 12:29 PM  [place] [address] [place], [place], [address] Phone: [phone]   Fax:  [phone]  Name: Angel Green MRN: [id number] Date of Birth: 1940-06-03

## 2018-09-29 ENCOUNTER — Ambulatory Visit: Payer: Medicare Other | Admitting: Physical Therapy

## 2018-09-29 ENCOUNTER — Other Ambulatory Visit: Payer: Self-pay

## 2018-09-29 ENCOUNTER — Encounter: Payer: Self-pay | Admitting: Physical Therapy

## 2018-09-29 DIAGNOSIS — M6281 Muscle weakness (generalized): Secondary | ICD-10-CM

## 2018-09-29 DIAGNOSIS — R2681 Unsteadiness on feet: Secondary | ICD-10-CM

## 2018-09-29 NOTE — Therapy (Signed)
[place], [place], [address] Phone: [phone]   Fax:  [phone]  Physical Therapy Treatment  Patient Details  Name: Angel Green MRN: [id number] Date of Birth: 10-20-1940 Referring Provider (PT): [name]   Encounter Date: 09/29/2018  PT End of Session - 09/29/18 1106    Visit Number  3    Date for PT Re-Evaluation  11/22/18    PT Start Time  9163    PT Stop Time  1100    PT Time Calculation (min)  45 min    Activity Tolerance  Patient tolerated treatment well;Patient limited by fatigue    Behavior During Therapy  [facility] for tasks assessed/performed       Past Medical History:  Diagnosis Date   Allergy    Dust Mites   Ankle pain    chronic   Diabetes mellitus, type 2 ([place])    Hyperlipidemia    Hypertension    Memory loss    Osteoarthritis    RENAL INSUFFICIENCY, CHRONIC 04/16/2010   Rhinitis    allergic nos    Past Surgical History:  Procedure Laterality Date   CATARACT EXTRACTION     CHOLECYSTECTOMY     dental implants     PARTIAL HYSTERECTOMY     in her 30s    There were no vitals filed for this visit.  Subjective Assessment - 09/29/18 1104    Subjective  Feeling pretty well. No complaints except low energy                       [facility] Adult PT Treatment/Exercise - 09/29/18 0001      High Level Balance   High Level Balance Activities  Side stepping;Backward walking      Lumbar Exercises: Standing   Other Standing Lumbar Exercises  standing march. 2x10      Lumbar Exercises: Seated   Other Seated Lumbar Exercises  OHP with red ball 2x10     Other Seated Lumbar Exercises  bilateral UE row. Green2x10      Knee/Hip Exercises: Aerobic   Nustep  level 4 x 6 minutes      Knee/Hip Exercises: Seated   Long Arc Quad  Both;2 sets;10 reps;Weights    Long Arc Quad Weight  2 lbs.    Ball Squeeze  2x10    Marching  2 sets;Both;10 reps    Marching  Weights  2 lbs.    Hamstring Curl  Strengthening;Both;2 sets;10 reps    Hamstring Limitations  green Tband     Abduction/Adduction   2 sets;Both;10 reps    Abd/Adduction Limitations  green               PT Short Term Goals - 09/22/18 1216      PT SHORT TERM GOAL #1   Title  independent with initial HEP    Time  2    Period  Weeks    Status  New        PT Long Term Goals - 09/22/18 1216      PT LONG TERM GOAL #1   Title  decrease TUG time to 13 seconds    Time  8    Period  Weeks    Status  New      PT LONG TERM GOAL #2   Title  increase Berg score to 48/56    Time  8    Period  Weeks  Status  New      PT LONG TERM GOAL #3   Title  increase hip strength to 4+/5    Time  8    Period  Weeks    Status  New      PT LONG TERM GOAL #4   Title  independent and safe with husband for advanced HEP for balance    Time  8    Period  Weeks    Status  New            Plan - 09/29/18 1106    Clinical Impression Statement  Fatigues quickly with PREs.  Requires cues for task at hand d/t apparent poor memory.  Requires HHA during sidestepping and marching.       Patient will benefit from skilled therapeutic intervention in order to improve the following deficits and impairments:  Abnormal gait, Cardiopulmonary status limiting activity, Decreased mobility, Decreased strength, Decreased range of motion, Decreased activity tolerance, Decreased endurance, Decreased balance, Difficulty walking  Visit Diagnosis: 1. Muscle weakness (generalized)   2. Unsteadiness on feet        Problem List Patient Active Problem List   Diagnosis Date Noted   Dementia ([place]) 08/14/2017   Cognitive impairment 09/22/2016   Morbid obesity ([place]) 06/30/2015   Diabetes ([place]) 04/23/2015   PCP NOTES >>>>> 12/28/2014   Leg ulcer, left ([place]) 10/12/2013   Annual physical exam 12/14/2011   Angioedema 10/22/2011   Tracheobronchitis, chronic ([place]) 08/20/2010   Fatigue  05/22/2010   CKD (chronic kidney disease) 04/16/2010   GERD 09/30/2008   EDEMA 06/10/2008   Hyperlipidemia 01/25/2007   ANKLE PAIN, CHRONIC 12/08/2006   Seasonal and perennial allergic rhinitis 10/20/2006   HTN (hypertension) 08/30/2006   OSTEOARTHROSIS, GENERALIZED, MULTIPLE SITES 08/30/2006    Olean Ree, PTA 09/29/2018, 11:27 AM  [place] [address] [place], [place], [address] Phone: [phone]   Fax:  [phone]  Name: Donis Kotowski MRN: [id number] Date of Birth: 02-Sep-1940

## 2018-10-03 ENCOUNTER — Ambulatory Visit: Payer: Medicare Other | Admitting: Physical Therapy

## 2018-10-03 ENCOUNTER — Encounter: Payer: Self-pay | Admitting: Physical Therapy

## 2018-10-03 ENCOUNTER — Other Ambulatory Visit: Payer: Self-pay

## 2018-10-03 DIAGNOSIS — M6281 Muscle weakness (generalized): Secondary | ICD-10-CM | POA: Diagnosis not present

## 2018-10-03 DIAGNOSIS — R2681 Unsteadiness on feet: Secondary | ICD-10-CM | POA: Diagnosis not present

## 2018-10-03 NOTE — Therapy (Signed)
[name], [place], [address] Phone: [phone]   Fax:  [phone]  Physical Therapy Treatment  Patient Details  Name: Angel Green MRN: [id number] Date of Birth: Jun 26, 1940 Referring Provider (PT): [name]   Encounter Date: 10/03/2018  PT End of Session - 10/03/18 1227    Visit Number  4    Date for PT Re-Evaluation  11/22/18    PT Start Time  0973    PT Stop Time  1227    PT Time Calculation (min)  42 min    Activity Tolerance  Patient tolerated treatment well    Behavior During Therapy  [facility] for tasks assessed/performed       Past Medical History:  Diagnosis Date  . Allergy    Dust Mites  . Ankle pain    chronic  . Diabetes mellitus, type 2 ([place])   . Hyperlipidemia   . Hypertension   . Memory loss   . Osteoarthritis   . RENAL INSUFFICIENCY, CHRONIC 04/16/2010  . Rhinitis    allergic nos    Past Surgical History:  Procedure Laterality Date  . CATARACT EXTRACTION    . CHOLECYSTECTOMY    . dental implants    . PARTIAL HYSTERECTOMY     in her 30s    There were no vitals filed for this visit.  Subjective Assessment - 10/03/18 1148    Subjective  "Feeling pretty good no pain."    Limitations  Walking    Patient Stated Goals  be stronger and move better                       [facility] Adult PT Treatment/Exercise - 10/03/18 0001      High Level Balance   High Level Balance Activities  Side stepping;Backward walking      Lumbar Exercises: Standing   Other Standing Lumbar Exercises  standing march. 2x10      Knee/Hip Exercises: Aerobic   Nustep  level 4 x 7 minutes      Knee/Hip Exercises: Machines for Strengthening   Cybex Knee Flexion  20lb 2x10      Knee/Hip Exercises: Seated   Long Arc Quad  Both;2 sets;10 reps;Weights    Long Arc Quad Weight  2 lbs.    Ball Squeeze  2x10    Marching  2 sets;Both;10 reps    Marching Weights  2 lbs.    Hamstring Curl   Strengthening;Both;2 sets;10 reps    Hamstring Limitations  green Tband     Abduction/Adduction   2 sets;Both;10 reps    Abd/Adduction Limitations  green    Sit to Sand  3 sets;5 reps;with UE support;without UE support   mat table              PT Short Term Goals - 09/22/18 1216      PT SHORT TERM GOAL #1   Title  independent with initial HEP    Time  2    Period  Weeks    Status  New        PT Long Term Goals - 09/22/18 1216      PT LONG TERM GOAL #1   Title  decrease TUG time to 13 seconds    Time  8    Period  Weeks    Status  New      PT LONG TERM GOAL #2   Title  increase Berg score to  48/56    Time  8    Period  Weeks    Status  New      PT LONG TERM GOAL #3   Title  increase hip strength to 4+/5    Time  8    Period  Weeks    Status  New      PT LONG TERM GOAL #4   Title  independent and safe with husband for advanced HEP for balance    Time  8    Period  Weeks    Status  New            Plan - 10/03/18 1228    Clinical Impression Statement  Side stepping and backwards walking performed without assist. Constant cues needed to show down and control muscle contraction with leg extensions and curls. No report of pain throughout session.-    Stability/Clinical Decision Making  Stable/Uncomplicated    Rehab Potential  Good    PT Frequency  2x / week    PT Treatment/Interventions  Gait training;Stair training;Functional mobility training;Therapeutic activities;Patient/family education;Neuromuscular re-education;Balance training;Therapeutic exercise;Manual techniques    PT Next Visit Plan  start exercises for strength and balance       Patient will benefit from skilled therapeutic intervention in order to improve the following deficits and impairments:  Abnormal gait, Cardiopulmonary status limiting activity, Decreased mobility, Decreased strength, Decreased range of motion, Decreased activity tolerance, Decreased endurance, Decreased balance,  Difficulty walking  Visit Diagnosis: 1. Muscle weakness (generalized)   2. Unsteadiness on feet        Problem List Patient Active Problem List   Diagnosis Date Noted  . Dementia ([place]) 08/14/2017  . Cognitive impairment 09/22/2016  . Morbid obesity ([place]) 06/30/2015  . Diabetes ([place]) 04/23/2015  . PCP NOTES >>>>> 12/28/2014  . Leg ulcer, left ([place]) 10/12/2013  . Annual physical exam 12/14/2011  . Angioedema 10/22/2011  . Tracheobronchitis, chronic ([place]) 08/20/2010  . Fatigue 05/22/2010  . CKD (chronic kidney disease) 04/16/2010  . GERD 09/30/2008  . EDEMA 06/10/2008  . Hyperlipidemia 01/25/2007  . ANKLE PAIN, CHRONIC 12/08/2006  . Seasonal and perennial allergic rhinitis 10/20/2006  . HTN (hypertension) 08/30/2006  . OSTEOARTHROSIS, GENERALIZED, MULTIPLE SITES 08/30/2006    Scot Jun, PTA 10/03/2018, 12:30 PM  [place] [address], [place], [address] Phone: [phone]   Fax:  [phone]  Name: Angel Green MRN: [id number] Date of Birth: 23-Nov-1940

## 2018-10-06 ENCOUNTER — Other Ambulatory Visit: Payer: Self-pay

## 2018-10-06 ENCOUNTER — Ambulatory Visit: Payer: Medicare Other | Admitting: Physical Therapy

## 2018-10-06 DIAGNOSIS — M6281 Muscle weakness (generalized): Secondary | ICD-10-CM

## 2018-10-06 DIAGNOSIS — R2681 Unsteadiness on feet: Secondary | ICD-10-CM | POA: Diagnosis not present

## 2018-10-06 NOTE — Patient Instructions (Addendum)
Sit to Stand    Sit on edge of chair, feet flat on floor. Stand upright, extending knees fully. Repeat 5 times per set. Do _3 sets per session. Do __1-2__ sessions per day.   FUNCTIONAL MOBILITY: Marching - Standing  STAND FACING THE COUNTER AND HOLD ON WITH BOTH HANDS    March in place by lifting left leg up, then right.  __10_ reps per set, 2___ sets per LEG, 1x per day  Copyright  VHI. All rights reserved.   Madelyn Flavors, PT 10/06/18 10:13 AM [place] [address] [place], [place], [address] Phone: [phone]   Fax:  [phone]

## 2018-10-06 NOTE — Therapy (Signed)
[place], [place], [address] Phone: [phone]   Fax:  [phone]  Physical Therapy Treatment  Patient Details  Name: Angel Green MRN: [id number] Date of Birth: 10/22/1940 Referring Provider (PT): [name]   Encounter Date: 10/06/2018  PT End of Session - 10/06/18 0933    Visit Number  5    Date for PT Re-Evaluation  11/22/18    PT Start Time  0931    PT Stop Time  1013    PT Time Calculation (min)  42 min    Activity Tolerance  Patient tolerated treatment well    Behavior During Therapy  [facility] for tasks assessed/performed       Past Medical History:  Diagnosis Date  . Allergy    Dust Mites  . Ankle pain    chronic  . Diabetes mellitus, type 2 ([place])   . Hyperlipidemia   . Hypertension   . Memory loss   . Osteoarthritis   . RENAL INSUFFICIENCY, CHRONIC 04/16/2010  . Rhinitis    allergic nos    Past Surgical History:  Procedure Laterality Date  . CATARACT EXTRACTION    . CHOLECYSTECTOMY    . dental implants    . PARTIAL HYSTERECTOMY     in her 30s    There were no vitals filed for this visit.  Subjective Assessment - 10/06/18 0934    Subjective  No new complaints. She states she is feeling better and her husband has noticed big changes.    Patient Stated Goals  be stronger and move better    Currently in Pain?  No/denies         [facility] PT Assessment - 10/06/18 0001      Timed Up and Go Test   Normal TUG (seconds)  15                   [facility] Adult PT Treatment/Exercise - 10/06/18 0001      Knee/Hip Exercises: Aerobic   Nustep  level 4 x 9 minutes      Knee/Hip Exercises: Machines for Strengthening   Cybex Knee Flexion  20lb 2x10      Knee/Hip Exercises: Standing   Hip Flexion  Stengthening;Both;2 sets;10 reps;Knee bent    Forward Step Up  Both;1 set;10 reps;Hand Hold: 1;Step Height: 4"    Other Standing Knee Exercises  toe taps to 4 in step x 10 each  side alternating      Knee/Hip Exercises: Seated   Long Arc Quad  Both;2 sets;10 reps;Weights    Long Arc Quad Weight  2 lbs.    Ball Squeeze  2x10    Marching  2 sets;Both;10 reps    Hamstring Curl  Strengthening;Both;2 sets;10 reps    Hamstring Limitations  green Tband     Abduction/Adduction   2 sets;Both;10 reps    Abd/Adduction Limitations  g    Sit to Sand  3 sets;5 reps;with UE support;without UE support   mat table              PT Short Term Goals - 09/22/18 1216      PT SHORT TERM GOAL #1   Title  independent with initial HEP    Time  2    Period  Weeks    Status  New        PT Long Term Goals - 10/06/18 1018      PT LONG TERM GOAL #1  Title  decrease TUG time to 13 seconds    Baseline  15 sec 10/06/18    Status  On-going            Plan - 10/06/18 1017    Clinical Impression Statement  Patient is progressing well toward goals. She reduced her TUG by 5 seconds today.    PT Treatment/Interventions  Gait training;Stair training;Functional mobility training;Therapeutic activities;Patient/family education;Neuromuscular re-education;Balance training;Therapeutic exercise;Manual techniques    PT Next Visit Plan  continue exercises for strength and balance       Patient will benefit from skilled therapeutic intervention in order to improve the following deficits and impairments:  Abnormal gait, Cardiopulmonary status limiting activity, Decreased mobility, Decreased strength, Decreased range of motion, Decreased activity tolerance, Decreased endurance, Decreased balance, Difficulty walking  Visit Diagnosis: 1. Muscle weakness (generalized)   2. Unsteadiness on feet        Problem List Patient Active Problem List   Diagnosis Date Noted  . Dementia ([place]) 08/14/2017  . Cognitive impairment 09/22/2016  . Morbid obesity ([place]) 06/30/2015  . Diabetes ([place]) 04/23/2015  . PCP NOTES >>>>> 12/28/2014  . Leg ulcer, left ([place]) 10/12/2013  . Annual  physical exam 12/14/2011  . Angioedema 10/22/2011  . Tracheobronchitis, chronic ([place]) 08/20/2010  . Fatigue 05/22/2010  . CKD (chronic kidney disease) 04/16/2010  . GERD 09/30/2008  . EDEMA 06/10/2008  . Hyperlipidemia 01/25/2007  . ANKLE PAIN, CHRONIC 12/08/2006  . Seasonal and perennial allergic rhinitis 10/20/2006  . HTN (hypertension) 08/30/2006  . OSTEOARTHROSIS, GENERALIZED, MULTIPLE SITES 08/30/2006    Madelyn Flavors PT 10/06/2018, 10:21 AM  [place] [address] [place], [place], [address] Phone: [phone]   Fax:  [phone]  Name: Angel Green MRN: [id number] Date of Birth: 02/04/1941

## 2018-10-11 ENCOUNTER — Other Ambulatory Visit: Payer: Self-pay

## 2018-10-11 ENCOUNTER — Ambulatory Visit: Payer: Medicare Other

## 2018-10-12 ENCOUNTER — Encounter: Payer: Self-pay | Admitting: Endocrinology

## 2018-10-12 ENCOUNTER — Ambulatory Visit (INDEPENDENT_AMBULATORY_CARE_PROVIDER_SITE_OTHER): Payer: Medicare Other | Admitting: Endocrinology

## 2018-10-12 VITALS — BP 110/62 | HR 109 | Ht 64.75 in | Wt 228.0 lb

## 2018-10-12 DIAGNOSIS — E1142 Type 2 diabetes mellitus with diabetic polyneuropathy: Secondary | ICD-10-CM | POA: Diagnosis not present

## 2018-10-12 DIAGNOSIS — Z23 Encounter for immunization: Secondary | ICD-10-CM | POA: Diagnosis not present

## 2018-10-12 DIAGNOSIS — E119 Type 2 diabetes mellitus without complications: Secondary | ICD-10-CM

## 2018-10-12 DIAGNOSIS — R000 Tachycardia, unspecified: Secondary | ICD-10-CM | POA: Diagnosis not present

## 2018-10-12 DIAGNOSIS — Z794 Long term (current) use of insulin: Secondary | ICD-10-CM

## 2018-10-12 DIAGNOSIS — J3089 Other allergic rhinitis: Secondary | ICD-10-CM | POA: Diagnosis not present

## 2018-10-12 DIAGNOSIS — E1129 Type 2 diabetes mellitus with other diabetic kidney complication: Secondary | ICD-10-CM

## 2018-10-12 LAB — POCT GLYCOSYLATED HEMOGLOBIN (HGB A1C): Hemoglobin A1C: 7.4 % — AB (ref 4.0–5.6)

## 2018-10-12 LAB — TSH: TSH: 1.69 u[IU]/mL (ref 0.35–4.50)

## 2018-10-12 NOTE — Progress Notes (Signed)
Subjective:    Patient ID: Angel Green, female    DOB: 02/16/1941, 78 y.o.   MRN: [id number]  HPI Pt returns for f/u of diabetes mellitus: DM type: Insulin-requiring type 2.   Dx'ed: 6063 Complications: polyneuropathy, and renal insufficiency.   Therapy: insulin since 0160, and trulicity.   GDM: never.  DKA: never.   Severe hypoglycemia: once, in 2019.   Pancreatitis: never.  Other: she changed to QAM premixed insulin, after poor results with multiple daily injections; pattern of cbg's indicates she does not need a PM dose; rx is limited by memory loss; husband sets pen to # of units, and pt administers.   Interval history: no cbg record, but husband states cbg varies from 115-145.  It is in general higher as the day goes on.  No new sxs.   Past Medical History:  Diagnosis Date  . Allergy    Dust Mites  . Ankle pain    chronic  . Diabetes mellitus, type 2 ([place])   . Hyperlipidemia   . Hypertension   . Memory loss   . Osteoarthritis   . RENAL INSUFFICIENCY, CHRONIC 04/16/2010  . Rhinitis    allergic nos    Past Surgical History:  Procedure Laterality Date  . CATARACT EXTRACTION    . CHOLECYSTECTOMY    . dental implants    . PARTIAL HYSTERECTOMY     in her 75s    Social History   Socioeconomic History  . Marital status: Married    Spouse name: Not on file  . Number of children: 2  . Years of education: some college  . Highest education level: Not on file  Occupational History  . Occupation:  retired  Scientific laboratory technician  . Financial resource strain: Not on file  . Food insecurity    Worry: Not on file    Inability: Not on file  . Transportation needs    Medical: Not on file    Non-medical: Not on file  Tobacco Use  . Smoking status: Never Smoker  . Smokeless tobacco: Never Used  Substance and Sexual Activity  . Alcohol use: No    Alcohol/week: 0.0 standard drinks  . Drug use: No  . Sexual activity: Yes    Partners: Male  Lifestyle  . Physical activity     Days per week: Not on file    Minutes per session: Not on file  . Stress: Not on file  Relationships  . Social Herbalist on phone: Not on file    Gets together: Not on file    Attends religious service: Not on file    Active member of club or organization: Not on file    Attends meetings of clubs or organizations: Not on file    Relationship status: Not on file  . Intimate partner violence    Fear of current or ex partner: Not on file    Emotionally abused: Not on file    Physically abused: Not on file    Forced sexual activity: Not on file  Other Topics Concern  . Not on file  Social History Narrative   Husband is a Company secretary.   Right-handed.   1 cup caffeine daily.   Lives at home with husband.           Current Outpatient Medications on File Prior to Visit  Medication Sig Dispense Refill  . amLODipine (NORVASC) 5 MG tablet Take 1 tablet (5 mg total) by mouth daily. 90 tablet  1  . aspirin 81 MG tablet Take 81 mg by mouth daily.      Marland Kitchen atorvastatin (LIPITOR) 10 MG tablet Take 1 tablet (10 mg total) by mouth daily. 90 tablet 1  . azelastine (ASTELIN) 0.1 % nasal spray Place 2 sprays into both nostrils at bedtime as needed for rhinitis. Use in each nostril as directed 90 mL 1  . Blood Glucose Monitoring Suppl (FREESTYLE LITE) DEVI Use to monitor your blood sugars 1 each 0  . cromolyn (NASALCROM) 5.2 MG/ACT nasal spray Place 1 spray into both nostrils 4 (four) times daily. 39 mL 1  . Cyanocobalamin (B-12 PO) Take 1 tablet by mouth daily.     Marland Kitchen donepezil (ARICEPT) 10 MG tablet Take 1 tablet (10 mg total) by mouth at bedtime. 90 tablet 4  . Dulaglutide (TRULICITY) 1.5 ZJ/6.9CV SOPN Inject 1.5 mg into the skin once a week. 12 pen 3  . EPINEPHrine 0.3 mg/0.3 mL IJ SOAJ injection Inject 0.3 mg into the muscle once. Reported on 06/30/2015    . fluticasone (FLONASE) 50 MCG/ACT nasal spray USE 1 TO 2 SPRAYS IN EACH NOSTRIL DAILY 16 g 3  . fluticasone (FLOVENT HFA) 44 MCG/ACT  inhaler Inhale 2 puffs into the lungs 2 (two) times daily. 3 Inhaler 3  . furosemide (LASIX) 20 MG tablet Take 2 tablets (40 mg total) by mouth daily. 180 tablet 1  . glucose blood (FREESTYLE LITE) test strip Use to monitor your blood sugars 4 times per day, once before each meal and once at bedtime 400 each 12  . Insulin Lispro Prot & Lispro (HUMALOG MIX 75/25 KWIKPEN) (75-25) 100 UNIT/ML Kwikpen Inject 32 Units into the skin daily with breakfast. 60 mL 5  . Lancets (FREESTYLE) lancets Use to monitor blood sugars 4 times per day, once before each meal and once at bedtime 400 each 12  . loratadine (CLARITIN) 10 MG tablet Take 1 tablet (10 mg total) by mouth daily. 90 tablet 3  . losartan (COZAAR) 50 MG tablet Take 1 tablet (50 mg total) by mouth daily. 90 tablet 1  . memantine (NAMENDA) 10 MG tablet Take 1 tablet (10 mg total) by mouth 2 (two) times daily. Take 1 tablet by mouth daily for 7 days, then 1 tablet by mouth twice daily 180 tablet 4  . Multiple Vitamins-Minerals (CENTRUM SILVER PO) Take 1 tablet by mouth daily.      Marland Kitchen omeprazole (PRILOSEC) 20 MG capsule Take 1 capsule (20 mg total) by mouth daily. 90 capsule 3  . Polyethyl Glycol-Propyl Glycol (SYSTANE) 0.4-0.3 % SOLN Apply 1 drop to eye daily as needed (for dry eyes).    . QUEtiapine (SEROQUEL) 25 MG tablet Take 1 tablet (25 mg total) by mouth at bedtime. 30 tablet 11  . SURE COMFORT PEN NEEDLES 31G X 5 MM MISC Use to inject insulin 1 time per day. 100 each 2   No current facility-administered medications on file prior to visit.     Allergies  Allergen Reactions  . Dust Mite Mixed Allergen Ext [Mite (D. Farinae)]     Family History  Problem Relation Age of Onset  . Healthy Brother   . Healthy Son   . Healthy Son   . Other Mother        unsure of history - "old age"  . Other Father        unsure of history - "old age"  . Heart attack Neg Hx   . Colon cancer Neg Hx   .  Breast cancer Neg Hx   . Stroke Neg Hx     BP  110/62 (BP Location: Right Wrist, Patient Position: Sitting, Cuff Size: Large)   Pulse (!) 109   Ht 5' 4.75" (1.645 m)   Wt 228 lb (103.4 kg)   SpO2 95%   BMI 38.23 kg/m    Review of Systems He denies hypoglycemia    Objective:   Physical Exam VITAL SIGNS:  See vs page GENERAL: no distress Pulses: dorsalis pedis intact bilat.   MSK: no deformity of the feet CV: trace bilat leg edema Skin:  no ulcer on the feet.  normal color and temp on the feet. Neuro: sensation is intact to touch on the feet  A1c=7.4%  Lab Results  Component Value Date   CREATININE 1.37 (H) 09/13/2018   BUN 17 09/13/2018   NA 141 09/13/2018   K 4.1 09/13/2018   CL 102 09/13/2018   CO2 32 09/13/2018       Assessment & Plan:  Insulin-requiring type 2 DM, with PN: this is the best control this pt should aim for, given this regimen, which does match insulin to her changing needs throughout the day Tachycardia: check TSH Renal insuff: This limits rx options  Patient Instructions  Please continue the same medications.   check your blood sugar twice a day.  vary the time of day when you check, between before the 3 meals, and at bedtime.  also check if you have symptoms of your blood sugar being too high or too low.  please keep a record of the readings and bring it to your next appointment here (or you can bring the meter itself).  You can write it on any piece of paper.  please call us sooner if your blood sugar goes below 70, or if you have a lot of readings over 200.   Please come back for a follow-up appointment in 3-4 months.

## 2018-10-12 NOTE — Patient Instructions (Addendum)
Please continue the same medications.   check your blood sugar twice a day.  vary the time of day when you check, between before the 3 meals, and at bedtime.  also check if you have symptoms of your blood sugar being too high or too low.  please keep a record of the readings and bring it to your next appointment here (or you can bring the meter itself).  You can write it on any piece of paper.  please call us sooner if your blood sugar goes below 70, or if you have a lot of readings over 200.   Please come back for a follow-up appointment in 3-4 months.

## 2018-10-13 ENCOUNTER — Encounter: Payer: Self-pay | Admitting: Physical Therapy

## 2018-10-13 ENCOUNTER — Other Ambulatory Visit: Payer: Self-pay

## 2018-10-13 ENCOUNTER — Ambulatory Visit: Payer: Medicare Other | Admitting: Physical Therapy

## 2018-10-13 DIAGNOSIS — M6281 Muscle weakness (generalized): Secondary | ICD-10-CM

## 2018-10-13 DIAGNOSIS — R2681 Unsteadiness on feet: Secondary | ICD-10-CM | POA: Diagnosis not present

## 2018-10-13 NOTE — Therapy (Signed)
[place], [place], [address] Phone: [phone]   Fax:  [phone]  Physical Therapy Treatment  Patient Details  Name: Angel Green MRN: [id number] Date of Birth: [birth year] Referring Provider (PT): [name]   Encounter Date: 10/13/2018  PT End of Session - 10/13/18 0925    Visit Number  6    Date for PT Re-Evaluation  11/22/18    PT Start Time  0842    PT Stop Time  0925    PT Time Calculation (min)  43 min    Activity Tolerance  Patient tolerated treatment well    Behavior During Therapy  [facility] for tasks assessed/performed       Past Medical History:  Diagnosis Date  . Allergy    Dust Mites  . Ankle pain    chronic  . Diabetes mellitus, type 2 ([place])   . Hyperlipidemia   . Hypertension   . Memory loss   . Osteoarthritis   . RENAL INSUFFICIENCY, CHRONIC 04/16/2010  . Rhinitis    allergic nos    Past Surgical History:  Procedure Laterality Date  . CATARACT EXTRACTION    . CHOLECYSTECTOMY    . dental implants    . PARTIAL HYSTERECTOMY     in her 30s    There were no vitals filed for this visit.  Subjective Assessment - 10/13/18 0846    Subjective  I feel stronger    Currently in Pain?  No/denies                       [facility] Adult PT Treatment/Exercise - 10/13/18 0001      High Level Balance   High Level Balance Activities  Side stepping;Backward walking;Tandem walking    High Level Balance Comments  ball toss solid surface and on dynamic surface, kicking ball      Lumbar Exercises: Machines for Strengthening   Other Lumbar Machine Exercise  standing 5# pulleys rows and shoulder extension      Knee/Hip Exercises: Aerobic   Nustep  level 5 x 7 minutes      Knee/Hip Exercises: Machines for Strengthening   Cybex Knee Extension  5# 3x10    Cybex Knee Flexion  20lb 3x10      Knee/Hip Exercises: Standing   Other Standing Knee Exercises  toe taps to 6 in step  x 10 each side alternating               PT Short Term Goals - 09/22/18 1216      PT SHORT TERM GOAL #1   Title  independent with initial HEP    Time  2    Period  Weeks    Status  New        PT Long Term Goals - 10/13/18 8101      PT LONG TERM GOAL #1   Title  decrease TUG time to 13 seconds    Status  On-going      PT LONG TERM GOAL #3   Title  increase hip strength to 4+/5    Status  On-going      PT LONG TERM GOAL #4   Title  independent and safe with husband for advanced HEP for balance    Status  On-going            Plan - 10/13/18 0925    Clinical Impression Statement  We increased some of the strength  and a lot more balance today, she did well, she was at times a little unsafe but rarely lost balance to the point of requiring Mod A to correct. biggest issues at times seems impulsive    PT Next Visit Plan  work on her function and safety    Consulted and Agree with Plan of Care  Patient       Patient will benefit from skilled therapeutic intervention in order to improve the following deficits and impairments:  Abnormal gait, Cardiopulmonary status limiting activity, Decreased mobility, Decreased strength, Decreased range of motion, Decreased activity tolerance, Decreased endurance, Decreased balance, Difficulty walking  Visit Diagnosis: Muscle weakness (generalized)  Unsteadiness on feet     Problem List Patient Active Problem List   Diagnosis Date Noted  . Dementia ([place]) 08/14/2017  . Cognitive impairment 09/22/2016  . Morbid obesity ([place]) 06/30/2015  . Diabetes ([place]) 04/23/2015  . PCP NOTES >>>>> 12/28/2014  . Leg ulcer, left ([place]) 10/12/2013  . Annual physical exam 12/14/2011  . Angioedema 10/22/2011  . Tracheobronchitis, chronic ([place]) 08/20/2010  . Fatigue 05/22/2010  . CKD (chronic kidney disease) 04/16/2010  . GERD 09/30/2008  . EDEMA 06/10/2008  . Hyperlipidemia 01/25/2007  . ANKLE PAIN, CHRONIC 12/08/2006  . Seasonal and  perennial allergic rhinitis 10/20/2006  . HTN (hypertension) 08/30/2006  . OSTEOARTHROSIS, GENERALIZED, MULTIPLE SITES 08/30/2006    Sumner Boast., PT 10/13/2018, 9:28 AM  [address] [place], [place], [address] Phone: [phone]   Fax:  [phone]  Name: Angel Green MRN: [id number] Date of Birth: [birth year]

## 2018-10-17 ENCOUNTER — Ambulatory Visit: Payer: Medicare Other | Admitting: Physical Therapy

## 2018-10-17 ENCOUNTER — Encounter: Payer: Self-pay | Admitting: Physical Therapy

## 2018-10-17 ENCOUNTER — Other Ambulatory Visit: Payer: Self-pay

## 2018-10-17 DIAGNOSIS — R2681 Unsteadiness on feet: Secondary | ICD-10-CM | POA: Diagnosis not present

## 2018-10-17 DIAGNOSIS — M6281 Muscle weakness (generalized): Secondary | ICD-10-CM

## 2018-10-17 NOTE — Therapy (Signed)
[facility] [address] [place], [place], [address] Phone: [phone]   Fax:  [phone]  Physical Therapy Treatment  Patient Details  Name: Angel Green MRN: [id number] Date of Birth: 07/14/1940 Referring Provider (PT): [name]   Encounter Date: 10/17/2018  PT End of Session - 10/17/18 1006    Visit Number  7    Date for PT Re-Evaluation  11/22/18    PT Start Time  0927    PT Stop Time  1010    PT Time Calculation (min)  43 min    Activity Tolerance  Patient tolerated treatment well    Behavior During Therapy  [facility] for tasks assessed/performed       Past Medical History:  Diagnosis Date  . Allergy    Dust Mites  . Ankle pain    chronic  . Diabetes mellitus, type 2 ([facility])   . Hyperlipidemia   . Hypertension   . Memory loss   . Osteoarthritis   . RENAL INSUFFICIENCY, CHRONIC 04/16/2010  . Rhinitis    allergic nos    Past Surgical History:  Procedure Laterality Date  . CATARACT EXTRACTION    . CHOLECYSTECTOMY    . dental implants    . PARTIAL HYSTERECTOMY     in her 30s    There were no vitals filed for this visit.  Subjective Assessment - 10/17/18 0927    Subjective  "I am feeling fine"    Currently in Pain?  No/denies         [facility] PT Assessment - 10/17/18 0001      Berg Balance Test   Sit to Stand  Able to stand without using hands and stabilize independently    Standing Unsupported  Able to stand safely 2 minutes    Sitting with Back Unsupported but Feet Supported on Floor or Stool  Able to sit safely and securely 2 minutes    Stand to Sit  Sits safely with minimal use of hands    Transfers  Able to transfer safely, minor use of hands    Standing Unsupported with Eyes Closed  Able to stand 10 seconds safely    Standing Unsupported with Feet Together  Able to place feet together independently and stand 1 minute safely    From Standing, Reach Forward with Outstretched Arm  Can reach  confidently >25 cm (10")    From Standing Position, Pick up Object from Floor  Able to pick up shoe safely and easily    From Standing Position, Turn to Look Behind Over each Shoulder  Looks behind one side only/other side shows less weight shift    Turn 360 Degrees  Able to turn 360 degrees safely in 4 seconds or less    Standing Unsupported, Alternately Place Feet on Step/Stool  Able to stand independently and safely and complete 8 steps in 20 seconds    Standing Unsupported, One Foot in Front  Able to plae foot ahead of the other independently and hold 30 seconds    Standing on One Leg  Able to lift leg independently and hold equal to or more than 3 seconds    Total Score  52      Timed Up and Go Test   Normal TUG (seconds)  14.16                   [facility] Adult PT Treatment/Exercise - 10/17/18 0001      High Level Balance     High Level Balance Activities  Side stepping;Backward walking      Lumbar Exercises: Standing   Other Standing Lumbar Exercises  Rows & Ext green tband 2x10       Knee/Hip Exercises: Aerobic   Nustep  level 4 x 7 minutes      Knee/Hip Exercises: Machines for Strengthening   Cybex Knee Extension  5# 3x10    Cybex Knee Flexion  20lb 3x10      Knee/Hip Exercises: Seated   Sit to Sand  2 sets;10 reps;without UE support;5 reps   mat table               PT Short Term Goals - 09/22/18 1216      PT SHORT TERM GOAL #1   Title  independent with initial HEP    Time  2    Period  Weeks    Status  New        PT Long Term Goals - 10/17/18 0944      PT LONG TERM GOAL #2   Title  increase Berg score to 48/56    Status  Achieved      PT LONG TERM GOAL #4   Title  independent and safe with husband for advanced HEP for balance    Status  Partially Met            Plan - 10/17/18 1007    Clinical Impression Statement  Pt has progressed completing some LTG. She has increase her BERG score ans well as decreased her TUG time. constant   cues for posture needed with standing rows and extensions. No reports of pain, cues to control the weight with seated leg extensions and curls.    Stability/Clinical Decision Making  Stable/Uncomplicated    Rehab Potential  Good    PT Frequency  2x / week    PT Duration  8 weeks    PT Treatment/Interventions  Gait training;Stair training;Functional mobility training;Therapeutic activities;Patient/family education;Neuromuscular re-education;Balance training;Therapeutic exercise;Manual techniques    PT Next Visit Plan  work on her function and safety       Patient will benefit from skilled therapeutic intervention in order to improve the following deficits and impairments:  Abnormal gait, Cardiopulmonary status limiting activity, Decreased mobility, Decreased strength, Decreased range of motion, Decreased activity tolerance, Decreased endurance, Decreased balance, Difficulty walking  Visit Diagnosis: Unsteadiness on feet  Muscle weakness (generalized)     Problem List Patient Active Problem List   Diagnosis Date Noted  . Dementia ([place]) 08/14/2017  . Cognitive impairment 09/22/2016  . Morbid obesity ([place]) 06/30/2015  . Diabetes ([place]) 04/23/2015  . PCP NOTES >>>>> 12/28/2014  . Leg ulcer, left ([place]) 10/12/2013  . Annual physical exam 12/14/2011  . Angioedema 10/22/2011  . Tracheobronchitis, chronic ([place]) 08/20/2010  . Fatigue 05/22/2010  . CKD (chronic kidney disease) 04/16/2010  . GERD 09/30/2008  . EDEMA 06/10/2008  . Hyperlipidemia 01/25/2007  . ANKLE PAIN, CHRONIC 12/08/2006  . Seasonal and perennial allergic rhinitis 10/20/2006  . HTN (hypertension) 08/30/2006  . OSTEOARTHROSIS, GENERALIZED, MULTIPLE SITES 08/30/2006    Scot Jun, PTA 10/17/2018, 10:08 AM  [place], [place], [address] Phone: [phone]   Fax:  [phone]  Name: Ciria Bernardini MRN: [id number] Date of  Birth: 1940-06-15

## 2018-10-19 ENCOUNTER — Telehealth: Payer: Self-pay

## 2018-10-19 NOTE — Telephone Encounter (Signed)
LVM letting her know appt has been cancelled

## 2018-10-20 ENCOUNTER — Encounter: Payer: Self-pay | Admitting: Physical Therapy

## 2018-10-20 ENCOUNTER — Other Ambulatory Visit: Payer: Self-pay

## 2018-10-20 ENCOUNTER — Ambulatory Visit: Payer: Medicare Other | Admitting: Physical Therapy

## 2018-10-20 DIAGNOSIS — R2681 Unsteadiness on feet: Secondary | ICD-10-CM

## 2018-10-20 DIAGNOSIS — M6281 Muscle weakness (generalized): Secondary | ICD-10-CM | POA: Diagnosis not present

## 2018-10-20 NOTE — Therapy (Signed)
[place], [place], [address] Phone: [phone]   Fax:  [phone]  Physical Therapy Treatment  Patient Details  Name: Angel Green MRN: [id number] Date of Birth: 07/02/1940 Referring Provider (PT): [name]   Encounter Date: 10/20/2018  PT End of Session - 10/20/18 1106    Visit Number  8    Date for PT Re-Evaluation  11/22/18    PT Start Time  3500    PT Stop Time  1055    PT Time Calculation (min)  40 min    Activity Tolerance  Patient tolerated treatment well    Behavior During Therapy  [facility] for tasks assessed/performed       Past Medical History:  Diagnosis Date  . Allergy    Dust Mites  . Ankle pain    chronic  . Diabetes mellitus, type 2 ([place])   . Hyperlipidemia   . Hypertension   . Memory loss   . Osteoarthritis   . RENAL INSUFFICIENCY, CHRONIC 04/16/2010  . Rhinitis    allergic nos    Past Surgical History:  Procedure Laterality Date  . CATARACT EXTRACTION    . CHOLECYSTECTOMY    . dental implants    . PARTIAL HYSTERECTOMY     in her 30s    There were no vitals filed for this visit.  Subjective Assessment - 10/20/18 1020    Subjective  Reports no pain and no falls    Currently in Pain?  No/denies                       [facility] Adult PT Treatment/Exercise - 10/20/18 0001      Ambulation/Gait   Stairs  Yes    Stairs Assistance  5: Supervision    Stairs Assistance Details (indicate cue type and reason)  cues for step over step   moderate use of UE on rail coming up   Number of Stairs  14    Door Management  --      High Level Balance   High Level Balance Activities  Side stepping;Backward walking;Direction changes;Negotiating over obstacles    High Level Balance Comments  ball toss      Knee/Hip Exercises: Machines for Strengthening   Cybex Knee Extension  5# 3x10    Cybex Knee Flexion  25lb 3x10    Cybex Leg Press  20# 2x10      Knee/Hip  Exercises: Standing   Hip Flexion  Stengthening;Both;2 sets;10 reps;Knee bent    Hip Flexion Limitations  3#    Hip Abduction  Stengthening;Both;2 sets;10 reps    Abduction Limitations  3#    Hip Extension  Both;2 sets;10 reps    Extension Limitations  3#               PT Short Term Goals - 09/22/18 1216      PT SHORT TERM GOAL #1   Title  independent with initial HEP    Time  2    Period  Weeks    Status  New        PT Long Term Goals - 10/20/18 1108      PT LONG TERM GOAL #1   Title  decrease TUG time to 13 seconds    Status  On-going      PT LONG TERM GOAL #3   Title  increase hip strength to 4+/5    Status  On-going  Plan - 10/20/18 1106    Clinical Impression Statement  Patietn did well, still difficulty going up and down stairs step over step, she got fatigued and lost balance one time and needed helpt ot not fall , this was during directions changes at theend of the treatment.    PT Next Visit Plan  continue to work on her strength, balance and safety, at times can be impulsive    Consulted and Agree with Plan of Care  Patient       Patient will benefit from skilled therapeutic intervention in order to improve the following deficits and impairments:  Abnormal gait, Cardiopulmonary status limiting activity, Decreased mobility, Decreased strength, Decreased range of motion, Decreased activity tolerance, Decreased endurance, Decreased balance, Difficulty walking  Visit Diagnosis: Unsteadiness on feet  Muscle weakness (generalized)     Problem List Patient Active Problem List   Diagnosis Date Noted  . Dementia ([place]) 08/14/2017  . Cognitive impairment 09/22/2016  . Morbid obesity ([place]) 06/30/2015  . Diabetes ([place]) 04/23/2015  . PCP NOTES >>>>> 12/28/2014  . Leg ulcer, left ([place]) 10/12/2013  . Annual physical exam 12/14/2011  . Angioedema 10/22/2011  . Tracheobronchitis, chronic ([place]) 08/20/2010  . Fatigue 05/22/2010  . CKD  (chronic kidney disease) 04/16/2010  . GERD 09/30/2008  . EDEMA 06/10/2008  . Hyperlipidemia 01/25/2007  . ANKLE PAIN, CHRONIC 12/08/2006  . Seasonal and perennial allergic rhinitis 10/20/2006  . HTN (hypertension) 08/30/2006  . OSTEOARTHROSIS, GENERALIZED, MULTIPLE SITES 08/30/2006    Sumner Boast., PT 10/20/2018, 11:08 AM  [address] [place], [place], [address] Phone: [phone]   Fax:  [phone]  Name: Angel Green MRN: [id number] Date of Birth: 1940/12/22

## 2018-10-23 ENCOUNTER — Ambulatory Visit: Payer: Medicare Other | Admitting: Neurology

## 2018-10-24 ENCOUNTER — Other Ambulatory Visit: Payer: Self-pay

## 2018-10-24 ENCOUNTER — Ambulatory Visit: Payer: Medicare Other | Attending: Internal Medicine | Admitting: Physical Therapy

## 2018-10-24 ENCOUNTER — Encounter: Payer: Self-pay | Admitting: Physical Therapy

## 2018-10-24 DIAGNOSIS — R2681 Unsteadiness on feet: Secondary | ICD-10-CM | POA: Insufficient documentation

## 2018-10-24 DIAGNOSIS — M6281 Muscle weakness (generalized): Secondary | ICD-10-CM

## 2018-10-24 DIAGNOSIS — J3089 Other allergic rhinitis: Secondary | ICD-10-CM | POA: Diagnosis not present

## 2018-10-24 NOTE — Therapy (Signed)
[place], [place], [address] Phone: [phone]   Fax:  [phone]  Physical Therapy Treatment  Patient Details  Name: Angel Green MRN: [id number] Date of Birth: Sep 09, 1940 Referring Provider (PT): [name]   Encounter Date: 10/24/2018  PT End of Session - 10/24/18 0843    Visit Number  9    Date for PT Re-Evaluation  11/22/18    PT Start Time  0759    PT Stop Time  0841    PT Time Calculation (min)  42 min    Activity Tolerance  Patient tolerated treatment well    Behavior During Therapy  [facility] for tasks assessed/performed       Past Medical History:  Diagnosis Date  . Allergy    Dust Mites  . Ankle pain    chronic  . Diabetes mellitus, type 2 ([place])   . Hyperlipidemia   . Hypertension   . Memory loss   . Osteoarthritis   . RENAL INSUFFICIENCY, CHRONIC 04/16/2010  . Rhinitis    allergic nos    Past Surgical History:  Procedure Laterality Date  . CATARACT EXTRACTION    . CHOLECYSTECTOMY    . dental implants    . PARTIAL HYSTERECTOMY     in her 30s    There were no vitals filed for this visit.  Subjective Assessment - 10/24/18 0800    Subjective  reports no pain or falls                       [facility] Adult PT Treatment/Exercise - 10/24/18 0001      Ambulation/Gait   Stairs  Yes    Stairs Assistance  5: Supervision    Stairs Assistance Details (indicate cue type and reason)  reciprocal steps without cues, moderate use of hand rail    Number of Stairs  14      High Level Balance   High Level Balance Activities  Side stepping;Marching forwards;Marching backwards. Hand held, cues to relax grip and high knees   High Level Balance Comments SLS not tolerated, DLS on airex not tolerated Ball toss and kicking      Lumbar Exercises: Aerobic   Nustep  lvl 4 4 min      Knee/Hip Exercises: Machines for Strengthening   Cybex Knee Extension  10# 2x10    Cybex Knee  Flexion  25# 2x10      Knee/Hip Exercises: Standing   Hip Flexion  Stengthening;Both;2 sets;10 reps;Knee bent    Hip Flexion Limitations  3#    Hip Abduction  Stengthening;Both;2 sets;10 reps   cues to stop leaning   Abduction Limitations  3#    Hip Extension  Both;2 sets;10 reps   cues to stop leaning   Extension Limitations  3#               PT Short Term Goals - 09/22/18 1216      PT SHORT TERM GOAL #1   Title  independent with initial HEP    Time  2    Period  Weeks    Status  New        PT Long Term Goals - 10/20/18 1108      PT LONG TERM GOAL #1   Title  decrease TUG time to 13 seconds    Status  On-going      PT LONG TERM GOAL #3   Title  increase hip  strength to 4+/5    Status  On-going            Plan - 10/24/18 0845    Clinical Impression Statement  patient did well, steps still a bit of challenge evident through the use of her UE to help, but no LOB during this exercise. Patient needed cuing to prevent compensatory patterns and visibly fatigued, SLS not tolerated nor DLS on airex. Patient refused to let go of my hands. Patient will continue to benefit from PT to address functional endurance, strength, and balance    PT Treatment/Interventions  Gait training;Stair training;Functional mobility training;Therapeutic activities;Patient/family education;Neuromuscular re-education;Balance training;Therapeutic exercise;Manual techniques    PT Next Visit Plan  continue to work on her strength, balance and safety       Patient will benefit from skilled therapeutic intervention in order to improve the following deficits and impairments:  Abnormal gait, Cardiopulmonary status limiting activity, Decreased mobility, Decreased strength, Decreased range of motion, Decreased activity tolerance, Decreased endurance, Decreased balance, Difficulty walking  Visit Diagnosis: Unsteadiness on feet  Muscle weakness (generalized)     Problem List Patient Active  Problem List   Diagnosis Date Noted  . Dementia ([place]) 08/14/2017  . Cognitive impairment 09/22/2016  . Morbid obesity ([place]) 06/30/2015  . Diabetes ([place]) 04/23/2015  . PCP NOTES >>>>> 12/28/2014  . Leg ulcer, left ([place]) 10/12/2013  . Annual physical exam 12/14/2011  . Angioedema 10/22/2011  . Tracheobronchitis, chronic ([place]) 08/20/2010  . Fatigue 05/22/2010  . CKD (chronic kidney disease) 04/16/2010  . GERD 09/30/2008  . EDEMA 06/10/2008  . Hyperlipidemia 01/25/2007  . ANKLE PAIN, CHRONIC 12/08/2006  . Seasonal and perennial allergic rhinitis 10/20/2006  . HTN (hypertension) 08/30/2006  . OSTEOARTHROSIS, GENERALIZED, MULTIPLE SITES 08/30/2006    Darcella Cheshire, SPTA 10/24/2018, 8:48 AM  [address] [place], [place], [address] Phone: [phone]   Fax:  [phone]  Name: Angel Green MRN: [id number] Date of Birth: 12-Nov-1940

## 2018-10-26 ENCOUNTER — Other Ambulatory Visit: Payer: Self-pay

## 2018-10-26 ENCOUNTER — Ambulatory Visit: Payer: Medicare Other | Admitting: Physical Therapy

## 2018-10-26 DIAGNOSIS — R2681 Unsteadiness on feet: Secondary | ICD-10-CM | POA: Diagnosis not present

## 2018-10-26 DIAGNOSIS — M6281 Muscle weakness (generalized): Secondary | ICD-10-CM | POA: Diagnosis not present

## 2018-10-26 NOTE — Therapy (Signed)
[place] [address] [place], [place], [address] Phone: [phone]   Fax:  [phone]  Physical Therapy Treatment  Patient Details  Name: Angel Green MRN: [id number] Date of Birth: May 04, 1940 Referring Provider (PT): [name]   Encounter Date: 10/26/2018  PT End of Session - 10/26/18 1258    Visit Number  10    Date for PT Re-Evaluation  11/22/18    PT Start Time  1300    PT Stop Time  1343    PT Time Calculation (min)  43 min    Activity Tolerance  Patient tolerated treatment well    Behavior During Therapy  [facility] for tasks assessed/performed       Past Medical History:  Diagnosis Date  . Allergy    Dust Mites  . Ankle pain    chronic  . Diabetes mellitus, type 2 ([place])   . Hyperlipidemia   . Hypertension   . Memory loss   . Osteoarthritis   . RENAL INSUFFICIENCY, CHRONIC 04/16/2010  . Rhinitis    allergic nos    Past Surgical History:  Procedure Laterality Date  . CATARACT EXTRACTION    . CHOLECYSTECTOMY    . dental implants    . PARTIAL HYSTERECTOMY     in her 30s    There were no vitals filed for this visit.  Subjective Assessment - 10/26/18 1259    Subjective  Patient reports improvements overall.         [facility] PT Assessment - 10/26/18 0001      Strength   Overall Strength Comments  hips 5/5 except right hip flex 4+/5,. shoulders 5/5, ellbows 5/5                   [facility] Adult PT Treatment/Exercise - 10/26/18 0001      High Level Balance   High Level Balance Activities  Side stepping;Backward walking;Marching forwards   with blue resistance     Lumbar Exercises: Aerobic   Nustep  lvl 4 4 min      Knee/Hip Exercises: Machines for Strengthening   Cybex Knee Extension  10# 2x10    Cybex Knee Flexion  25# 2x10      Knee/Hip Exercises: Standing   Hip Flexion  Stengthening;Both;1 set;10 reps    Hip Flexion Limitations  3#; difficult at end of session today    Hip  Abduction  Stengthening;Both;1 set;10 reps;Knee straight    Abduction Limitations  3#; difficult at end of session today    Lateral Step Up  Both;1 set;10 reps;Hand Hold: 0;Step Height: 2"    Lateral Step Up Limitations  onto blue circle; 6 reps on right with CGA    Forward Step Up  Both;1 set;10 reps;Hand Hold: 0;Hand Hold: 1;Step Height: 2"    Forward Step Up Limitations  onto blue circle; needed hand when leading with RLE               PT Short Term Goals - 10/26/18 1302      PT SHORT TERM GOAL #1   Title  independent with initial HEP    Baseline  reports she is not always compliant    Time  2    Period  Weeks    Status  On-going        PT Long Term Goals - 10/26/18 1301      PT LONG TERM GOAL #1   Title  decrease TUG time to 13 seconds  Baseline  15 sec 10/06/18; 16 sec 10/26/18    Time  8    Period  Weeks    Status  On-going      PT LONG TERM GOAL #2   Title  increase Berg score to 48/56    Baseline  52/56 on 10/17/18    Time  8    Period  Weeks    Status  Achieved      PT LONG TERM GOAL #3   Title  increase hip strength to 4+/5    Time  8    Period  Weeks    Status  Achieved      PT LONG TERM GOAL #4   Title  independent and safe with husband for advanced HEP for balance    Time  8    Period  Weeks    Status  Partially Met            Plan - 10/26/18 1444    Clinical Impression Statement  Patient is progressing well toward goals, but continues to demonstrate unsteadiness with gait and single leg stance activiites. She has continued functional weakness in bil glut medius muscles left > right. She will benefit from continued PT to address these deficits.    PT Frequency  2x / week    PT Duration  8 weeks    PT Treatment/Interventions  Gait training;Stair training;Functional mobility training;Therapeutic activities;Patient/family education;Neuromuscular re-education;Balance training;Therapeutic exercise;Manual techniques    PT Next Visit Plan   continue to work on her strength, balance and safety    PT Home Exercise Plan  progress and encourage compliance    Consulted and Agree with Plan of Care  Patient       Patient will benefit from skilled therapeutic intervention in order to improve the following deficits and impairments:  Abnormal gait, Cardiopulmonary status limiting activity, Decreased mobility, Decreased strength, Decreased range of motion, Decreased activity tolerance, Decreased endurance, Decreased balance, Difficulty walking  Visit Diagnosis: Unsteadiness on feet  Muscle weakness (generalized)     Problem List Patient Active Problem List   Diagnosis Date Noted  . Dementia ([place]) 08/14/2017  . Cognitive impairment 09/22/2016  . Morbid obesity ([place]) 06/30/2015  . Diabetes ([place]) 04/23/2015  . PCP NOTES >>>>> 12/28/2014  . Leg ulcer, left ([place]) 10/12/2013  . Annual physical exam 12/14/2011  . Angioedema 10/22/2011  . Tracheobronchitis, chronic ([place]) 08/20/2010  . Fatigue 05/22/2010  . CKD (chronic kidney disease) 04/16/2010  . GERD 09/30/2008  . EDEMA 06/10/2008  . Hyperlipidemia 01/25/2007  . ANKLE PAIN, CHRONIC 12/08/2006  . Seasonal and perennial allergic rhinitis 10/20/2006  . HTN (hypertension) 08/30/2006  . OSTEOARTHROSIS, GENERALIZED, MULTIPLE SITES 08/30/2006    Madelyn Flavors PT 10/26/2018, 2:48 PM  [place] [address] [place], [place], [address] Phone: [phone]   Fax:  [phone]  Name: Angel Green MRN: [id number] Date of Birth: 01-12-1941

## 2018-10-31 ENCOUNTER — Other Ambulatory Visit: Payer: Self-pay

## 2018-10-31 ENCOUNTER — Encounter: Payer: Self-pay | Admitting: Physical Therapy

## 2018-10-31 ENCOUNTER — Ambulatory Visit: Payer: Medicare Other | Admitting: Physical Therapy

## 2018-10-31 DIAGNOSIS — R2681 Unsteadiness on feet: Secondary | ICD-10-CM

## 2018-10-31 DIAGNOSIS — M6281 Muscle weakness (generalized): Secondary | ICD-10-CM | POA: Diagnosis not present

## 2018-10-31 NOTE — Therapy (Signed)
[place], [place], [address] Phone: [phone]   Fax:  [phone]  Physical Therapy Treatment  Patient Details  Name: Angel Green MRN: [id number] Date of Birth: 1940-08-01 Referring Provider (PT): [name]   Encounter Date: 10/31/2018  PT End of Session - 10/31/18 1053    Visit Number  11    Date for PT Re-Evaluation  11/22/18    PT Start Time  1010    PT Stop Time  1055    PT Time Calculation (min)  45 min    Activity Tolerance  Patient tolerated treatment well    Behavior During Therapy  [facility] for tasks assessed/performed       Past Medical History:  Diagnosis Date  . Allergy    Dust Mites  . Ankle pain    chronic  . Diabetes mellitus, type 2 ([place])   . Hyperlipidemia   . Hypertension   . Memory loss   . Osteoarthritis   . RENAL INSUFFICIENCY, CHRONIC 04/16/2010  . Rhinitis    allergic nos    Past Surgical History:  Procedure Laterality Date  . CATARACT EXTRACTION    . CHOLECYSTECTOMY    . dental implants    . PARTIAL HYSTERECTOMY     in her 30s    There were no vitals filed for this visit.  Subjective Assessment - 10/31/18 1011    Subjective  feeling good, everyday. still reports overall imporvement.                       [facility] Adult PT Treatment/Exercise - 10/31/18 0001      High Level Balance   High Level Balance Activities  Side stepping;Backward walking;Marching forwards      Lumbar Exercises: Aerobic   Nustep  lvl 5 64mn      Knee/Hip Exercises: Machines for Strengthening   Cybex Knee Extension  10# 2x12    Cybex Knee Flexion  25# 2x10      Knee/Hip Exercises: Standing   Hip Flexion  Stengthening;Both;1 set;10 reps    Hip Abduction  Stengthening;Both;1 set;10 reps;Knee straight   tactile cues for form   Hip Extension  Stengthening;Both;1 set;10 reps;Knee straight   tactile cues for form   Lateral Step Up  Both;1 set;10 reps;Hand Hold:  0;Step Height: 2"    Forward Step Up  Both;1 set;10 reps;Hand Hold: 0;Hand Hold: 1;Step Height: 2"    Forward Step Up Limitations  onto blue circle    Walking with Sports Cord  20# fwd/bckwd x5 side step BIL x3               PT Short Term Goals - 10/26/18 1302      PT SHORT TERM GOAL #1   Title  independent with initial HEP    Baseline  reports she is not always compliant    Time  2    Period  Weeks    Status  On-going        PT Long Term Goals - 10/26/18 1301      PT LONG TERM GOAL #1   Title  decrease TUG time to 13 seconds    Baseline  15 sec 10/06/18; 16 sec 10/26/18    Time  8    Period  Weeks    Status  On-going      PT LONG TERM GOAL #2   Title  increase Berg score to 48/56  Baseline  52/56 on 10/17/18    Time  8    Period  Weeks    Status  Achieved      PT LONG TERM GOAL #3   Title  increase hip strength to 4+/5    Time  8    Period  Weeks    Status  Achieved      PT LONG TERM GOAL #4   Title  independent and safe with husband for advanced HEP for balance    Time  8    Period  Weeks    Status  Partially Met            Plan - 10/31/18 1055    Clinical Impression Statement  patient is porgressing well evident by the increased ability to maintain balance, perform exercises. she is limited by unsteadiness and single leg activites. She will benefit from PT to address balance, strength, and functional endurance    PT Treatment/Interventions  Gait training;Stair training;Functional mobility training;Therapeutic activities;Patient/family education;Neuromuscular re-education;Balance training;Therapeutic exercise;Manual techniques    PT Next Visit Plan  continue to work on her strength, balance and safety       Patient will benefit from skilled therapeutic intervention in order to improve the following deficits and impairments:  Abnormal gait, Cardiopulmonary status limiting activity, Decreased mobility, Decreased strength, Decreased range of motion,  Decreased activity tolerance, Decreased endurance, Decreased balance, Difficulty walking  Visit Diagnosis: Unsteadiness on feet  Muscle weakness (generalized)     Problem List Patient Active Problem List   Diagnosis Date Noted  . Dementia ([place]) 08/14/2017  . Cognitive impairment 09/22/2016  . Morbid obesity ([place]) 06/30/2015  . Diabetes ([place]) 04/23/2015  . PCP NOTES >>>>> 12/28/2014  . Leg ulcer, left ([place]) 10/12/2013  . Annual physical exam 12/14/2011  . Angioedema 10/22/2011  . Tracheobronchitis, chronic ([employer]) 08/20/2010  . Fatigue 05/22/2010  . CKD (chronic kidney disease) 04/16/2010  . GERD 09/30/2008  . EDEMA 06/10/2008  . Hyperlipidemia 01/25/2007  . ANKLE PAIN, CHRONIC 12/08/2006  . Seasonal and perennial allergic rhinitis 10/20/2006  . HTN (hypertension) 08/30/2006  . 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

## 2018-11-02 ENCOUNTER — Other Ambulatory Visit: Payer: Self-pay

## 2018-11-02 ENCOUNTER — Encounter: Payer: Self-pay | Admitting: Physical Therapy

## 2018-11-02 ENCOUNTER — Ambulatory Visit: Payer: Medicare Other | Admitting: Physical Therapy

## 2018-11-02 DIAGNOSIS — R2681 Unsteadiness on feet: Secondary | ICD-10-CM | POA: Diagnosis not present

## 2018-11-02 DIAGNOSIS — M6281 Muscle weakness (generalized): Secondary | ICD-10-CM

## 2018-11-02 NOTE — Therapy (Signed)
[place], [place], [address] Phone: [phone]   Fax:  [phone]  Physical Therapy Treatment  Patient Details  Name: Angel Green MRN: [id number] Date of Birth: 12/31/1940 Referring Provider (PT): [name]   Encounter Date: 11/02/2018  PT End of Session - 11/02/18 1053    Visit Number  12    Date for PT Re-Evaluation  11/22/18    PT Start Time  2683    PT Stop Time  1055    PT Time Calculation (min)  40 min    Activity Tolerance  Patient tolerated treatment well    Behavior During Therapy  [facility] for tasks assessed/performed       Past Medical History:  Diagnosis Date  . Allergy    Dust Mites  . Ankle pain    chronic  . Diabetes mellitus, type 2 ([place])   . Hyperlipidemia   . Hypertension   . Memory loss   . Osteoarthritis   . RENAL INSUFFICIENCY, CHRONIC 04/16/2010  . Rhinitis    allergic nos    Past Surgical History:  Procedure Laterality Date  . CATARACT EXTRACTION    . CHOLECYSTECTOMY    . dental implants    . PARTIAL HYSTERECTOMY     in her 30s    There were no vitals filed for this visit.  Subjective Assessment - 11/02/18 1017    Subjective  Pt is doing good, reports no falls or stumbles    Limitations  Walking    Patient Stated Goals  be stronger and move better    Currently in Pain?  No/denies                       [facility] Adult PT Treatment/Exercise - 11/02/18 0001      High Level Balance   High Level Balance Activities  Side stepping;Backward walking      Lumbar Exercises: Aerobic   Nustep  lvl 5 14mn      Lumbar Exercises: Seated   Sit to Stand  10 reps   x2 from blue chair      Knee/Hip Exercises: Machines for Strengthening   Cybex Knee Extension  10# 2x12    Cybex Knee Flexion  25# 2x10      Knee/Hip Exercises: Standing   Forward Step Up  Both;1 set;Step Height: 4";Hand Hold: 2;5 reps    Walking with Sports Cord  4 way 20lb x5 each    Other Standing Knee Exercises  toe taps to 6 in step x 10 each side alternating               PT Short Term Goals - 10/26/18 1302      PT SHORT TERM GOAL #1   Title  independent with initial HEP    Baseline  reports she is not always compliant    Time  2    Period  Weeks    Status  On-going        PT Long Term Goals - 10/26/18 1301      PT LONG TERM GOAL #1   Title  decrease TUG time to 13 seconds    Baseline  15 sec 10/06/18; 16 sec 10/26/18    Time  8    Period  Weeks    Status  On-going      PT LONG TERM GOAL #2   Title  increase Berg score to 48/56  Baseline  52/56 on 10/17/18    Time  8    Period  Weeks    Status  Achieved      PT LONG TERM GOAL #3   Title  increase hip strength to 4+/5    Time  8    Period  Weeks    Status  Achieved      PT LONG TERM GOAL #4   Title  independent and safe with husband for advanced HEP for balance    Time  8    Period  Weeks    Status  Partially Met            Plan - 11/02/18 1054    Clinical Impression Statement  Pt was able to complete all of today's interventions. Tactile cues to keep hips square with side steps. Cues to ambulate normal with resisted gait, pt tended to resort to a marching type gait. No reports of pain. Cues to complete the full ROM with seated curls and extensions. Bilat UE needed with step ups.    Stability/Clinical Decision Making  Stable/Uncomplicated    Rehab Potential  Good    PT Frequency  2x / week    PT Duration  8 weeks    PT Treatment/Interventions  Gait training;Stair training;Functional mobility training;Therapeutic activities;Patient/family education;Neuromuscular re-education;Balance training;Therapeutic exercise;Manual techniques    PT Next Visit Plan  continue to work on her strength, balance and safety       Patient will benefit from skilled therapeutic intervention in order to improve the following deficits and impairments:  Abnormal gait, Cardiopulmonary status limiting  activity, Decreased mobility, Decreased strength, Decreased range of motion, Decreased activity tolerance, Decreased endurance, Decreased balance, Difficulty walking  Visit Diagnosis: Unsteadiness on feet  Muscle weakness (generalized)     Problem List Patient Active Problem List   Diagnosis Date Noted  . Dementia ([place]) 08/14/2017  . Cognitive impairment 09/22/2016  . Morbid obesity ([place]) 06/30/2015  . Diabetes ([place]) 04/23/2015  . PCP NOTES >>>>> 12/28/2014  . Leg ulcer, left ([place]) 10/12/2013  . Annual physical exam 12/14/2011  . Angioedema 10/22/2011  . Tracheobronchitis, chronic ([place]) 08/20/2010  . Fatigue 05/22/2010  . CKD (chronic kidney disease) 04/16/2010  . GERD 09/30/2008  . EDEMA 06/10/2008  . Hyperlipidemia 01/25/2007  . ANKLE PAIN, CHRONIC 12/08/2006  . Seasonal and perennial allergic rhinitis 10/20/2006  . HTN (hypertension) 08/30/2006  . OSTEOARTHROSIS, GENERALIZED, MULTIPLE SITES 08/30/2006    Scot Jun, PTA 11/02/2018, 10:58 AM  [place], [place], [address] Phone: [phone]   Fax:  [phone]  Name: Angel Green MRN: [id number] Date of Birth: Jul 10, 1940

## 2018-11-07 ENCOUNTER — Other Ambulatory Visit: Payer: Self-pay

## 2018-11-07 ENCOUNTER — Ambulatory Visit: Payer: Medicare Other | Admitting: Physical Therapy

## 2018-11-07 ENCOUNTER — Encounter: Payer: Self-pay | Admitting: Physical Therapy

## 2018-11-07 DIAGNOSIS — M6281 Muscle weakness (generalized): Secondary | ICD-10-CM

## 2018-11-07 DIAGNOSIS — R2681 Unsteadiness on feet: Secondary | ICD-10-CM | POA: Diagnosis not present

## 2018-11-07 NOTE — Therapy (Signed)
[place], [place], [address] Phone: [phone]   Fax:  [phone]  Physical Therapy Treatment  Patient Details  Name: Angel Green MRN: [id number] Date of Birth: 1940/05/18 Referring Provider (PT): [name]   Encounter Date: 11/07/2018  PT End of Session - 11/07/18 1058    Visit Number  13    Date for PT Re-Evaluation  11/22/18    PT Start Time  1017    PT Stop Time  1056    PT Time Calculation (min)  39 min    Activity Tolerance  Patient tolerated treatment well    Behavior During Therapy  [facility] for tasks assessed/performed       Past Medical History:  Diagnosis Date  . Allergy    Dust Mites  . Ankle pain    chronic  . Diabetes mellitus, type 2 ([place])   . Hyperlipidemia   . Hypertension   . Memory loss   . Osteoarthritis   . RENAL INSUFFICIENCY, CHRONIC 04/16/2010  . Rhinitis    allergic nos    Past Surgical History:  Procedure Laterality Date  . CATARACT EXTRACTION    . CHOLECYSTECTOMY    . dental implants    . PARTIAL HYSTERECTOMY     in her 30s    There were no vitals filed for this visit.  Subjective Assessment - 11/07/18 1018    Subjective  feeling good , reports no falls or stumbles.    Currently in Pain?  No/denies                       [facility] Adult PT Treatment/Exercise - 11/07/18 0001      High Level Balance   High Level Balance Activities  Side stepping;Backward walking      Lumbar Exercises: Aerobic   Nustep  lvl 5 9mn      Knee/Hip Exercises: Machines for Strengthening   Cybex Knee Extension  10# 2x12    Cybex Knee Flexion  25# 2x10      Knee/Hip Exercises: Standing   Forward Step Up  2 sets;Both;10 reps   emphasis on preventing stumbling back off step, and UE use   Walking with Sports Cord  4 way 20lb x5 each    Other Standing Knee Exercises  toe taps to 6 in step x 10 each side alternating               PT Short Term Goals  - 10/26/18 1302      PT SHORT TERM GOAL #1   Title  independent with initial HEP    Baseline  reports she is not always compliant    Time  2    Period  Weeks    Status  On-going        PT Long Term Goals - 10/26/18 1301      PT LONG TERM GOAL #1   Title  decrease TUG time to 13 seconds    Baseline  15 sec 10/06/18; 16 sec 10/26/18    Time  8    Period  Weeks    Status  On-going      PT LONG TERM GOAL #2   Title  increase Berg score to 48/56    Baseline  52/56 on 10/17/18    Time  8    Period  Weeks    Status  Achieved      PT LONG TERM GOAL #3  Title  increase hip strength to 4+/5    Time  8    Period  Weeks    Status  Achieved      PT LONG TERM GOAL #4   Title  independent and safe with husband for advanced HEP for balance    Time  8    Period  Weeks    Status  Partially Met            Plan - 11/07/18 1103    Clinical Impression Statement  pt tolerated exercises well, minor stumbling during step-up exercises when dsimounting the step. tactile and verbal cues to square hips and shoulders during side steps and for maximal knee flexion and extension for full ROM. UE use was limited during step up activities toward the end after pt got more comfortable. Pt will continue to benefit from PT to improve balance and strength of LE    PT Treatment/Interventions  Gait training;Stair training;Functional mobility training;Therapeutic activities;Patient/family education;Neuromuscular re-education;Balance training;Therapeutic exercise;Manual techniques    PT Next Visit Plan  continue to work on her strength, balance and safety    PT Home Exercise Plan  progress and encourage compliance       Patient will benefit from skilled therapeutic intervention in order to improve the following deficits and impairments:  Abnormal gait, Cardiopulmonary status limiting activity, Decreased mobility, Decreased strength, Decreased range of motion, Decreased activity tolerance, Decreased  endurance, Decreased balance, Difficulty walking  Visit Diagnosis: Unsteadiness on feet  Muscle weakness (generalized)     Problem List Patient Active Problem List   Diagnosis Date Noted  . Dementia ([place]) 08/14/2017  . Cognitive impairment 09/22/2016  . Morbid obesity ([place]) 06/30/2015  . Diabetes ([place]) 04/23/2015  . PCP NOTES >>>>> 12/28/2014  . Leg ulcer, left ([place]) 10/12/2013  . Annual physical exam 12/14/2011  . Angioedema 10/22/2011  . Tracheobronchitis, chronic ([place]) 08/20/2010  . Fatigue 05/22/2010  . CKD (chronic kidney disease) 04/16/2010  . GERD 09/30/2008  . EDEMA 06/10/2008  . Hyperlipidemia 01/25/2007  . ANKLE PAIN, CHRONIC 12/08/2006  . Seasonal and perennial allergic rhinitis 10/20/2006  . HTN (hypertension) 08/30/2006  . 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

## 2018-11-10 ENCOUNTER — Other Ambulatory Visit: Payer: Self-pay

## 2018-11-10 ENCOUNTER — Ambulatory Visit: Payer: Medicare Other | Admitting: Physical Therapy

## 2018-11-10 DIAGNOSIS — R2681 Unsteadiness on feet: Secondary | ICD-10-CM

## 2018-11-10 DIAGNOSIS — M6281 Muscle weakness (generalized): Secondary | ICD-10-CM | POA: Diagnosis not present

## 2018-11-10 NOTE — Therapy (Signed)
[address] [place], [place], [address] Phone: [phone]   Fax:  [phone]  Physical Therapy Treatment  Patient Details  Name: Angel Green MRN: [id number] Date of Birth: 1940-09-09 Referring Provider (PT): [name]   Encounter Date: 11/10/2018  PT End of Session - 11/10/18 1047    Visit Number  14    Date for PT Re-Evaluation  11/22/18    PT Start Time  1011    PT Stop Time  1055    PT Time Calculation (min)  44 min       Past Medical History:  Diagnosis Date  . Allergy    Dust Mites  . Ankle pain    chronic  . Diabetes mellitus, type 2 ([place])   . Hyperlipidemia   . Hypertension   . Memory loss   . Osteoarthritis   . RENAL INSUFFICIENCY, CHRONIC 04/16/2010  . Rhinitis    allergic nos    Past Surgical History:  Procedure Laterality Date  . CATARACT EXTRACTION    . CHOLECYSTECTOMY    . dental implants    . PARTIAL HYSTERECTOMY     in her 30s    There were no vitals filed for this visit.  Subjective Assessment - 11/10/18 1013    Subjective  doing well, getting stonger and getting around better. PT has really helped alot    Currently in Pain?  No/denies                       [facility] Adult PT Treatment/Exercise - 11/10/18 0001      High Level Balance   High Level Balance Activities  Negotiating over obstacles;Backward walking;Marching forwards;Side stepping   3# ankle wts and HHA     Lumbar Exercises: Aerobic   Nustep  lvl 5 89mn      Lumbar Exercises: Seated   Sit to Stand  10 reps   x2 from mat with wt ball press     Knee/Hip Exercises: Machines for Strengthening   Cybex Knee Extension  10# 3 sets 10    Cybex Knee Flexion  25# 3 sets 10      Knee/Hip Exercises: Standing   Forward Step Up  Both;10 reps;Hand Hold: 2;Step Height: 4"   working on stepping over and back   Other Standing Knee Exercises  toe taps to 6 in step x 10 each side alternating   3# HHA on  airex marching, hip flex, ext and abd,  squats    Other Standing Knee Exercises  cable pulleys BIL hip 4 way 10 reps each   moderate cuing needed for ex and very fatiiguing for pt              PT Short Term Goals - 11/10/18 1014      PT SHORT TERM GOAL #1   Title  independent with initial HEP    Baseline  pt states can do it but not 100% compliant    Status  Achieved        PT Long Term Goals - 11/10/18 1015      PT LONG TERM GOAL #1   Title  decrease TUG time to 13 seconds    Baseline  11/10/18   13.5 sec  sec    Status  Partially Met      PT LONG TERM GOAL #4   Title  independent and safe with husband for advanced HEP for balance  Status  Partially Met            Plan - 11/10/18 1023    Clinical Impression Statement  pt progressing with remaining goals. TUG is continues to improve and almost to goal. pt verb can do HEP but not 100% compliant. increased reps with ex today and pt tolertaed well. Continue to need cuing and guarding with ex.    PT Treatment/Interventions  Gait training;Stair training;Functional mobility training;Therapeutic activities;Patient/family education;Neuromuscular re-education;Balance training;Therapeutic exercise;Manual techniques    PT Next Visit Plan  continue to work on her strength, balance and safety       Patient will benefit from skilled therapeutic intervention in order to improve the following deficits and impairments:  Abnormal gait, Cardiopulmonary status limiting activity, Decreased mobility, Decreased strength, Decreased range of motion, Decreased activity tolerance, Decreased endurance, Decreased balance, Difficulty walking  Visit Diagnosis: Unsteadiness on feet  Muscle weakness (generalized)     Problem List Patient Active Problem List   Diagnosis Date Noted  . Dementia ([place]) 08/14/2017  . Cognitive impairment 09/22/2016  . Morbid obesity ([place]) 06/30/2015  . Diabetes ([place]) 04/23/2015  . PCP NOTES >>>>>  12/28/2014  . Leg ulcer, left ([place]) 10/12/2013  . Annual physical exam 12/14/2011  . Angioedema 10/22/2011  . Tracheobronchitis, chronic ([place]) 08/20/2010  . Fatigue 05/22/2010  . CKD (chronic kidney disease) 04/16/2010  . GERD 09/30/2008  . EDEMA 06/10/2008  . Hyperlipidemia 01/25/2007  . ANKLE PAIN, CHRONIC 12/08/2006  . Seasonal and perennial allergic rhinitis 10/20/2006  . HTN (hypertension) 08/30/2006  . OSTEOARTHROSIS, GENERALIZED, MULTIPLE SITES 08/30/2006    Angel Green,ANGIE PTA 11/10/2018, 10:48 AM  [address] [place], [place], [address] Phone: [phone]   Fax:  [phone]  Name: Angel Green MRN: [id number] Date of Birth: [birth year]

## 2018-11-13 NOTE — Progress Notes (Signed)
Subjective:   Angel Green is a 78 y.o. female who presents for Medicare Annual (Subsequent) preventive examination.  Pt has dementia and  is accompanied by husband.   Review of Systems:   Home Safety/Smoke Alarms: Feels safe in home. Smoke alarms in place.  Lives in 2 story home w/ husband. Master on 1st. Walk in shower with grab bar.    Female:     Mammo-01/24/18       Dexa scan- 11/15/17       Eye- pt need to schedule with Dr.Groat.    Objective:     Vitals: BP 122/78 (BP Location: Left Arm, Patient Position: Sitting, Cuff Size: Large)   Pulse 95   Ht 5\' 5"  (1.651 m)   Wt 225 lb 9.6 oz (102.3 kg)   SpO2 98%   BMI 37.54 kg/m   Body mass index is 37.54 kg/m.  Advanced Directives 11/14/2018 09/22/2018 11/11/2017 04/04/2017 03/05/2016 06/16/2015 12/19/2014  Does Patient Have a Medical Advance Directive? No No Yes Yes Yes No Yes  Type of Advance Directive - Public librarian;Living will [place];Living will [place];Living will - [place];Living will  Does patient want to make changes to medical advance directive? - - - - - - No - Patient declined  Copy of [place] in Chart? - - No - copy requested - No - copy requested - No - copy requested  Would patient like information on creating a medical advance directive? No - Patient declined No - Patient declined - - - No - patient declined information -    Tobacco Social History   Tobacco Use  Smoking Status Never Smoker  Smokeless Tobacco Never Used     Counseling given: Not Answered   Clinical Intake: Pain : No/denies pain   Past Medical History:  Diagnosis Date  . Allergy    Dust Mites  . Ankle pain    chronic  . Diabetes mellitus, type 2 ([place])   . Hyperlipidemia   . Hypertension   . Memory loss   . Osteoarthritis   . RENAL INSUFFICIENCY, CHRONIC 04/16/2010  . Rhinitis    allergic nos   Past Surgical History:   Procedure Laterality Date  . CATARACT EXTRACTION    . CHOLECYSTECTOMY    . dental implants    . PARTIAL HYSTERECTOMY     in her 73s   Family History  Problem Relation Age of Onset  . Healthy Brother   . Healthy Son   . Healthy Son   . Other Mother        unsure of history - "old age"  . Other Father        unsure of history - "old age"  . Heart attack Neg Hx   . Colon cancer Neg Hx   . Breast cancer Neg Hx   . Stroke Neg Hx    Social History   Socioeconomic History  . Marital status: Married    Spouse name: Not on file  . Number of children: 2  . Years of education: some college  . Highest education level: Not on file  Occupational History  . Occupation:  retired  Scientific laboratory technician  . Financial resource strain: Not on file  . Food insecurity    Worry: Not on file    Inability: Not on file  . Transportation needs    Medical: Not on file    Non-medical: Not on file  Tobacco Use  . Smoking status: Never Smoker  . Smokeless tobacco: Never Used  Substance and Sexual Activity  . Alcohol use: No    Alcohol/week: 0.0 standard drinks  . Drug use: No  . Sexual activity: Yes    Partners: Male  Lifestyle  . Physical activity    Days per week: Not on file    Minutes per session: Not on file  . Stress: Not on file  Relationships  . Social Herbalist on phone: Not on file    Gets together: Not on file    Attends religious service: Not on file    Active member of club or organization: Not on file    Attends meetings of clubs or organizations: Not on file    Relationship status: Not on file  Other Topics Concern  . Not on file  Social History Narrative   Husband is a Company secretary.   Right-handed.   1 cup caffeine daily.   Lives at home with husband.           Outpatient Encounter Medications as of 11/14/2018  Medication Sig  . amLODipine (NORVASC) 5 MG tablet Take 1 tablet (5 mg total) by mouth daily.  Marland Kitchen aspirin 81 MG tablet Take 81 mg by mouth daily.    Marland Kitchen  atorvastatin (LIPITOR) 10 MG tablet Take 1 tablet (10 mg total) by mouth daily.  Marland Kitchen azelastine (ASTELIN) 0.1 % nasal spray Place 2 sprays into both nostrils at bedtime as needed for rhinitis. Use in each nostril as directed  . Blood Glucose Monitoring Suppl (FREESTYLE LITE) DEVI Use to monitor your blood sugars  . cromolyn (NASALCROM) 5.2 MG/ACT nasal spray Place 1 spray into both nostrils 4 (four) times daily.  . Cyanocobalamin (B-12 PO) Take 1 tablet by mouth daily.   Marland Kitchen donepezil (ARICEPT) 10 MG tablet Take 1 tablet (10 mg total) by mouth at bedtime.  . Dulaglutide (TRULICITY) 1.5 ZO/1.0RU SOPN Inject 1.5 mg into the skin once a week.  Marland Kitchen EPINEPHrine 0.3 mg/0.3 mL IJ SOAJ injection Inject 0.3 mg into the muscle once. Reported on 06/30/2015  . fluticasone (FLONASE) 50 MCG/ACT nasal spray USE 1 TO 2 SPRAYS IN EACH NOSTRIL DAILY  . fluticasone (FLOVENT HFA) 44 MCG/ACT inhaler Inhale 2 puffs into the lungs 2 (two) times daily.  . furosemide (LASIX) 20 MG tablet Take 2 tablets (40 mg total) by mouth daily.  Marland Kitchen glucose blood (FREESTYLE LITE) test strip Use to monitor your blood sugars 4 times per day, once before each meal and once at bedtime  . Insulin Lispro Prot & Lispro (HUMALOG MIX 75/25 KWIKPEN) (75-25) 100 UNIT/ML Kwikpen Inject 32 Units into the skin daily with breakfast.  . Lancets (FREESTYLE) lancets Use to monitor blood sugars 4 times per day, once before each meal and once at bedtime  . loratadine (CLARITIN) 10 MG tablet Take 1 tablet (10 mg total) by mouth daily.  Marland Kitchen losartan (COZAAR) 50 MG tablet Take 1 tablet (50 mg total) by mouth daily.  . memantine (NAMENDA) 10 MG tablet Take 1 tablet (10 mg total) by mouth 2 (two) times daily. Take 1 tablet by mouth daily for 7 days, then 1 tablet by mouth twice daily  . Multiple Vitamins-Minerals (CENTRUM SILVER PO) Take 1 tablet by mouth daily.    . QUEtiapine (SEROQUEL) 25 MG tablet Take 1 tablet (25 mg total) by mouth at bedtime.  . SURE COMFORT PEN  NEEDLES 31G X 5 MM MISC Use to  inject insulin 1 time per day.  Marland Kitchen omeprazole (PRILOSEC) 20 MG capsule Take 1 capsule (20 mg total) by mouth daily. (Patient not taking: Reported on 11/14/2018)  . Polyethyl Glycol-Propyl Glycol (SYSTANE) 0.4-0.3 % SOLN Apply 1 drop to eye daily as needed (for dry eyes).   No facility-administered encounter medications on file as of 11/14/2018.     Activities of Daily Living In your present state of health, do you have any difficulty performing the following activities: 11/14/2018  Hearing? N  Vision? N  Difficulty concentrating or making decisions? Y  Walking or climbing stairs? N  Dressing or bathing? N  Doing errands, shopping? Y  Comment husband Restaurant manager, fast food and eating ? Y  Using the Toilet? N  In the past six months, have you accidently leaked urine? N  Do you have problems with loss of bowel control? N  Managing your Medications? Y  Managing your Finances? Y  Some recent data might be hidden    Patient Care Team: Colon Branch, MD as PCP - General Renato Shin, MD as Consulting Physician (Endocrinology) Donato Heinz, MD as Consulting Physician (Nephrology) Tiajuana Amass, MD as Referring Physician (Allergy and Immunology) Juanita Craver, MD as Consulting Physician (Gastroenterology) Renette Butters, MD as Attending Physician (Orthopedic Surgery) Sydnee Levans, MD as Referring Physician (Dermatology)    Assessment:   This is a routine wellness examination for Nohealani. Physical assessment deferred to PCP.  Exercise Activities and Dietary recommendations Current Exercise Habits: Home exercise routine, Time (Minutes): 45, Frequency (Times/Week): 2, Weekly Exercise (Minutes/Week): 90, Intensity: Mild, Exercise limited by: None identified   Diet (meal preparation, eat out, water intake, caffeinated beverages, dairy products, fruits and vegetables): 24 hr recall Breakfast: banana Lunch: sausage and potatoes Dinner: catfish, corn,  potato  Pt states she drinks plenty of water.   Goals    . Increase physical activity     Walking- walking the [place] and Treadmill    . Lose 20 lbs by next year.   (pt-stated)       Fall Risk Fall Risk  11/14/2018 09/13/2018 11/11/2017 05/05/2017 03/05/2016  Falls in the past year? 0 1 No Yes No  Number falls in past yr: - 1 - 1 -  Injury with Fall? - 0 - Yes -  Follow up - - - Falls evaluation completed -   Depression Screen PHQ 2/9 Scores 11/14/2018 11/11/2017 05/05/2017 03/05/2016  PHQ - 2 Score 0 0 0 0     Cognitive Function  MMSE - Mini Mental State Exam 11/14/2018 04/20/2018 11/11/2017 10/18/2017 03/05/2016  Not completed: Unable to complete - - - -  Orientation to time - 3 3 1 5   Orientation to Place - 4 5 4 5   Registration - 3 3 3 3   Attention/ Calculation - 2 2 2 5   Recall - 0 3 0 2  Language- name 2 objects - 2 2 2 2   Language- repeat - 1 1 1 1   Language- follow 3 step command - 3 0 3 3  Language- read & follow direction - 1 1 1 1   Write a sentence - 1 1 1 1   Copy design - 1 0 1 1  Total score - 21 21 19 29         Immunization History  Administered Date(s) Administered  . Fluad Quad(high Dose 65+) 10/12/2018  . Influenza Split 12/02/2011, 12/21/2013  . Influenza Whole 12/21/2006, 11/28/2007, 11/22/2008, 11/23/2010  . Influenza, High Dose Seasonal PF 11/29/2012, 12/02/2015,  12/06/2016, 11/11/2017  . Influenza-Unspecified 12/04/2010, 12/18/2013, 12/05/2014  . Pneumococcal Conjugate-13 12/18/2013  . Pneumococcal Polysaccharide-23 11/14/2007, 03/24/2018  . Td 11/14/2007  . Tdap 04/11/2018  . Zoster Recombinat (Shingrix) 03/28/2018, 09/11/2018    Screening Tests Health Maintenance  Topic Date Due  . OPHTHALMOLOGY EXAM  03/09/2015  . FOOT EXAM  03/24/2019  . HEMOGLOBIN A1C  04/14/2019  . MAMMOGRAM  01/25/2020  . TETANUS/TDAP  04/11/2028  . INFLUENZA VACCINE  Completed  . DEXA SCAN  Completed  . PNA vac Low Risk Adult  Completed      Plan:    Please  schedule your next medicare wellness visit with me in 1 yr.  Continue to eat heart healthy diet (full of fruits, vegetables, whole grains, lean protein, water--limit salt, fat, and sugar intake) and increase physical activity as tolerated.  Continue doing brain stimulating activities (puzzles, reading, adult coloring books, staying active) to keep memory sharp.   Bring a copy of your living will and/or healthcare power of attorney to your next office visit.    I have personally reviewed and noted the following in the patient's chart:   . Medical and social history . Use of alcohol, tobacco or illicit drugs  . Current medications and supplements . Functional ability and status . Nutritional status . Physical activity . Advanced directives . List of other physicians . Hospitalizations, surgeries, and ER visits in previous 12 months . Vitals . Screenings to include cognitive, depression, and falls . Referrals and appointments  In addition, I have reviewed and discussed with patient certain preventive protocols, quality metrics, and best practice recommendations. A written personalized care plan for preventive services as well as general preventive health recommendations were provided to patient.     Shela Nevin, [place]  11/14/2018  PCP note: pt is current doing PT 2x/week. Husband states this is going well and would like for it to continue. Husband is also curious if Neurontin might be beneficial for pt's neuropathy.

## 2018-11-14 ENCOUNTER — Encounter: Payer: Self-pay | Admitting: *Deleted

## 2018-11-14 ENCOUNTER — Encounter: Payer: Self-pay | Admitting: Physical Therapy

## 2018-11-14 ENCOUNTER — Ambulatory Visit (INDEPENDENT_AMBULATORY_CARE_PROVIDER_SITE_OTHER): Payer: Medicare Other | Admitting: *Deleted

## 2018-11-14 ENCOUNTER — Ambulatory Visit: Payer: Medicare Other | Admitting: Physical Therapy

## 2018-11-14 ENCOUNTER — Other Ambulatory Visit: Payer: Self-pay

## 2018-11-14 VITALS — BP 122/78 | HR 95 | Ht 65.0 in | Wt 225.6 lb

## 2018-11-14 DIAGNOSIS — M6281 Muscle weakness (generalized): Secondary | ICD-10-CM

## 2018-11-14 DIAGNOSIS — R2681 Unsteadiness on feet: Secondary | ICD-10-CM | POA: Diagnosis not present

## 2018-11-14 DIAGNOSIS — Z0000 Encounter for general adult medical examination without abnormal findings: Secondary | ICD-10-CM | POA: Diagnosis not present

## 2018-11-14 NOTE — Patient Instructions (Signed)
Please schedule your next medicare wellness visit with me in 1 yr.  Continue to eat heart healthy diet (full of fruits, vegetables, whole grains, lean protein, water--limit salt, fat, and sugar intake) and increase physical activity as tolerated.  Continue doing brain stimulating activities (puzzles, reading, adult coloring books, staying active) to keep memory sharp.   Bring a copy of your living will and/or healthcare power of attorney to your next office visit.   [name] , Thank you for taking time to come for your Medicare Wellness Visit. I appreciate your ongoing commitment to your health goals. Please review the following plan we discussed and let me know if I can assist you in the future.   These are the goals we discussed: Goals    . Increase physical activity     Walking- walking the [place] and Treadmill    . Lose 20 lbs by next year.   (pt-stated)       This is a list of the screening recommended for you and due dates:  Health Maintenance  Topic Date Due  . Eye exam for diabetics  03/09/2015  . Complete foot exam   03/24/2019  . Hemoglobin A1C  04/14/2019  . Mammogram  01/25/2020  . Tetanus Vaccine  04/11/2028  . Flu Shot  Completed  . DEXA scan (bone density measurement)  Completed  . Pneumonia vaccines  Completed    Health Maintenance After Age 70 After age 34, you are at a higher risk for certain long-term diseases and infections as well as injuries from falls. Falls are a major cause of broken bones and head injuries in people who are older than age 52. Getting regular preventive care can help to keep you healthy and well. Preventive care includes getting regular testing and making lifestyle changes as recommended by your health care provider. Talk with your health care provider about:  Which screenings and tests you should have. A screening is a test that checks for a disease when you have no symptoms.  A diet and exercise plan that is right for you. What  should I know about screenings and tests to prevent falls? Screening and testing are the best ways to find a health problem early. Early diagnosis and treatment give you the best chance of managing medical conditions that are common after age 94. Certain conditions and lifestyle choices may make you more likely to have a fall. Your health care provider may recommend:  Regular vision checks. Poor vision and conditions such as cataracts can make you more likely to have a fall. If you wear glasses, make sure to get your prescription updated if your vision changes.  Medicine review. Work with your health care provider to regularly review all of the medicines you are taking, including over-the-counter medicines. Ask your health care provider about any side effects that may make you more likely to have a fall. Tell your health care provider if any medicines that you take make you feel dizzy or sleepy.  Osteoporosis screening. Osteoporosis is a condition that causes the bones to get weaker. This can make the bones weak and cause them to break more easily.  Blood pressure screening. Blood pressure changes and medicines to control blood pressure can make you feel dizzy.  Strength and balance checks. Your health care provider may recommend certain tests to check your strength and balance while standing, walking, or changing positions.  Foot health exam. Foot pain and numbness, as well as not wearing proper footwear, can  make you more likely to have a fall.  Depression screening. You may be more likely to have a fall if you have a fear of falling, feel emotionally low, or feel unable to do activities that you used to do.  Alcohol use screening. Using too much alcohol can affect your balance and may make you more likely to have a fall. What actions can I take to lower my risk of falls? General instructions  Talk with your health care provider about your risks for falling. Tell your health care provider if:  ? You fall. Be sure to tell your health care provider about all falls, even ones that seem minor. ? You feel dizzy, sleepy, or off-balance.  Take over-the-counter and prescription medicines only as told by your health care provider. These include any supplements.  Eat a healthy diet and maintain a healthy weight. A healthy diet includes low-fat dairy products, low-fat (lean) meats, and fiber from whole grains, beans, and lots of fruits and vegetables. Home safety  Remove any tripping hazards, such as rugs, cords, and clutter.  Install safety equipment such as grab bars in bathrooms and safety rails on stairs.  Keep rooms and walkways well-lit. Activity   Follow a regular exercise program to stay fit. This will help you maintain your balance. Ask your health care provider what types of exercise are appropriate for you.  If you need a cane or walker, use it as recommended by your health care provider.  Wear supportive shoes that have nonskid soles. Lifestyle  Do not drink alcohol if your health care provider tells you not to drink.  If you drink alcohol, limit how much you have: ? 0-1 drink a day for women. ? 0-2 drinks a day for men.  Be aware of how much alcohol is in your drink. In the [place] one drink equals one typical bottle of beer (12 oz), one-half glass of wine (5 oz), or one shot of hard liquor (1 oz).  Do not use any products that contain nicotine or tobacco, such as cigarettes and e-cigarettes. If you need help quitting, ask your health care provider. Summary  Having a healthy lifestyle and getting preventive care can help to protect your health and wellness after age 55.  Screening and testing are the best way to find a health problem early and help you avoid having a fall. Early diagnosis and treatment give you the best chance for managing medical conditions that are more common for people who are older than age 80.  Falls are a major cause of broken bones and head  injuries in people who are older than age 25. Take precautions to prevent a fall at home.  Work with your health care provider to learn what changes you can make to improve your health and wellness and to prevent falls. This information is not intended to replace advice given to you by your health care provider. Make sure you discuss any questions you have with your health care provider. Document Released: 12/22/2016 Document Revised: 06/01/2018 Document Reviewed: 12/22/2016 Elsevier Patient Education  2020 [employer].

## 2018-11-14 NOTE — Therapy (Signed)
[place], [place], [address] Phone: [phone]   Fax:  [phone]  Physical Therapy Treatment  Patient Details  Name: Angel Green MRN: [id number] Date of Birth: 16-Jan-1941 Referring Provider (PT): [name]   Encounter Date: 11/14/2018  PT End of Session - 11/14/18 1232    Visit Number  15    Date for PT Re-Evaluation  11/22/18    PT Start Time  1144    PT Stop Time  1226    PT Time Calculation (min)  42 min    Activity Tolerance  Patient tolerated treatment well    Behavior During Therapy  [facility] for tasks assessed/performed       Past Medical History:  Diagnosis Date  . Allergy    Dust Mites  . Ankle pain    chronic  . Diabetes mellitus, type 2 ([place])   . Hyperlipidemia   . Hypertension   . Memory loss   . Osteoarthritis   . RENAL INSUFFICIENCY, CHRONIC 04/16/2010  . Rhinitis    allergic nos    Past Surgical History:  Procedure Laterality Date  . CATARACT EXTRACTION    . CHOLECYSTECTOMY    . dental implants    . PARTIAL HYSTERECTOMY     in her 30s    There were no vitals filed for this visit.  Subjective Assessment - 11/14/18 1145    Subjective  "just tires, running around with him all morning"    Currently in Pain?  No/denies                       [facility] Adult PT Treatment/Exercise - 11/14/18 0001      High Level Balance   High Level Balance Activities  Side stepping;Backward walking    High Level Balance Comments  side step on and off airex 2x5, Standing march on Airex 2x10 some LOB      Lumbar Exercises: Aerobic   Nustep  lvl 5 72mn      Lumbar Exercises: Standing   Other Standing Lumbar Exercises  Alr 6 in box taps 2x10      Lumbar Exercises: Seated   Sit to Stand  10 reps   x2 from mat table, holding yellow ball      Knee/Hip Exercises: Machines for Strengthening   Cybex Knee Extension  10# 3 sets 10    Cybex Knee Flexion  25# 3 sets 10                PT Short Term Goals - 11/10/18 1014      PT SHORT TERM GOAL #1   Title  independent with initial HEP    Baseline  pt states can do it but not 100% compliant    Status  Achieved        PT Long Term Goals - 11/10/18 1015      PT LONG TERM GOAL #1   Title  decrease TUG time to 13 seconds    Baseline  11/10/18   13.5 sec  sec    Status  Partially Met      PT LONG TERM GOAL #4   Title  independent and safe with husband for advanced HEP for balance    Status  Partially Met            Plan - 11/14/18 1233    Clinical Impression Statement  Pt enters clinic reporting increase fatigue. She had a  really tough time with intervention in airex pain requiring min assist at times to correct.  Cues to maintain good form with sit to stands also seeded. CGA needed for alt box taps.    Stability/Clinical Decision Making  Stable/Uncomplicated    PT Frequency  2x / week    PT Duration  8 weeks    PT Treatment/Interventions  Gait training;Stair training;Functional mobility training;Therapeutic activities;Patient/family education;Neuromuscular re-education;Balance training;Therapeutic exercise;Manual techniques    PT Next Visit Plan  continue to work on her strength, balance and safety       Patient will benefit from skilled therapeutic intervention in order to improve the following deficits and impairments:  Abnormal gait, Cardiopulmonary status limiting activity, Decreased mobility, Decreased strength, Decreased range of motion, Decreased activity tolerance, Decreased endurance, Decreased balance, Difficulty walking  Visit Diagnosis: Unsteadiness on feet  Muscle weakness (generalized)     Problem List Patient Active Problem List   Diagnosis Date Noted  . Dementia ([place]) 08/14/2017  . Cognitive impairment 09/22/2016  . Morbid obesity ([place]) 06/30/2015  . Diabetes ([place]) 04/23/2015  . PCP NOTES >>>>> 12/28/2014  . Leg ulcer, left ([place]) 10/12/2013  . Annual physical  exam 12/14/2011  . Angioedema 10/22/2011  . Tracheobronchitis, chronic ([place]) 08/20/2010  . Fatigue 05/22/2010  . CKD (chronic kidney disease) 04/16/2010  . GERD 09/30/2008  . EDEMA 06/10/2008  . Hyperlipidemia 01/25/2007  . ANKLE PAIN, CHRONIC 12/08/2006  . Seasonal and perennial allergic rhinitis 10/20/2006  . HTN (hypertension) 08/30/2006  . OSTEOARTHROSIS, GENERALIZED, MULTIPLE SITES 08/30/2006    Scot Jun, PTA 11/14/2018, 12:37 PM  [place] [place], [place], [address] Phone: [phone]   Fax:  [phone]  Name: Salley Boxley MRN: [id number] Date of Birth: May 26, 1940

## 2018-11-16 ENCOUNTER — Other Ambulatory Visit: Payer: Self-pay | Admitting: Endocrinology

## 2018-11-17 ENCOUNTER — Other Ambulatory Visit: Payer: Self-pay

## 2018-11-17 ENCOUNTER — Ambulatory Visit: Payer: Medicare Other | Admitting: Physical Therapy

## 2018-11-17 ENCOUNTER — Encounter: Payer: Self-pay | Admitting: Physical Therapy

## 2018-11-17 DIAGNOSIS — R2681 Unsteadiness on feet: Secondary | ICD-10-CM

## 2018-11-17 DIAGNOSIS — M6281 Muscle weakness (generalized): Secondary | ICD-10-CM

## 2018-11-17 NOTE — Therapy (Signed)
[place], [place], [address] Phone: [phone]   Fax:  [phone]  Physical Therapy Treatment  Patient Details  Name: Angel Green MRN: [id number] Date of Birth: 04/17/1940 Referring Provider (PT): [name]   Encounter Date: 11/17/2018  PT End of Session - 11/17/18 1058    Visit Number  16    Date for PT Re-Evaluation  11/22/18    PT Start Time  1019    PT Stop Time  1059    PT Time Calculation (min)  40 min    Activity Tolerance  Patient tolerated treatment well    Behavior During Therapy  [facility] for tasks assessed/performed       Past Medical History:  Diagnosis Date  . Allergy    Dust Mites  . Ankle pain    chronic  . Diabetes mellitus, type 2 ([place])   . Hyperlipidemia   . Hypertension   . Memory loss   . Osteoarthritis   . RENAL INSUFFICIENCY, CHRONIC 04/16/2010  . Rhinitis    allergic nos    Past Surgical History:  Procedure Laterality Date  . CATARACT EXTRACTION    . CHOLECYSTECTOMY    . dental implants    . PARTIAL HYSTERECTOMY     in her 30s    There were no vitals filed for this visit.  Subjective Assessment - 11/17/18 1022    Subjective  "I feel good"    Limitations  Walking    Patient Stated Goals  be stronger and move better    Currently in Pain?  No/denies         [facility] PT Assessment - 11/17/18 0001      Timed Up and Go Test   Normal TUG (seconds)  11.41                   [facility] Adult PT Treatment/Exercise - 11/17/18 0001      Lumbar Exercises: Aerobic   Nustep  lvl 4 75min      Lumbar Exercises: Standing   Row  Theraband;20 reps;Both;Strengthening    Theraband Level (Row)  Level 4 (Blue)    Shoulder Extension  Strengthening;Both;10 reps;Theraband    Theraband Level (Shoulder Extension)  Level 2 (Red)      Lumbar Exercises: Seated   Sit to Stand  10 reps   x2 from blue chair holding yellow ball      Knee/Hip Exercises: Machines for  Strengthening   Cybex Knee Extension  10# 3 sets 10    Cybex Knee Flexion  25# 3 sets 10      Knee/Hip Exercises: Standing   Walking with Sports Cord  4 way front bak 40lb, side step 30lb x3 each    Other Standing Knee Exercises  alt box taps 6in 2x10 each, 8in x10                PT Short Term Goals - 11/10/18 1014      PT SHORT TERM GOAL #1   Title  independent with initial HEP    Baseline  pt states can do it but not 100% compliant    Status  Achieved        PT Long Term Goals - 11/17/18 1058      PT LONG TERM GOAL #1   Title  decrease TUG time to 13 seconds    Status  Achieved  Plan - 11/17/18 1058    Clinical Impression Statement  Pt has progressed meeting her TUG goal. No reports of pain. Cues to slow down with seated leg curls and extensions. Some instability with alt box taps but pt able to correct. Postural cues required with standing rows and extensions.    Stability/Clinical Decision Making  Stable/Uncomplicated    Rehab Potential  Good    PT Frequency  2x / week    PT Duration  8 weeks    PT Treatment/Interventions  Gait training;Stair training;Functional mobility training;Therapeutic activities;Patient/family education;Neuromuscular re-education;Balance training;Therapeutic exercise;Manual techniques    PT Next Visit Plan  continue to work on her strength, balance and safety       Patient will benefit from skilled therapeutic intervention in order to improve the following deficits and impairments:  Abnormal gait, Cardiopulmonary status limiting activity, Decreased mobility, Decreased strength, Decreased range of motion, Decreased activity tolerance, Decreased endurance, Decreased balance, Difficulty walking  Visit Diagnosis: Unsteadiness on feet  Muscle weakness (generalized)     Problem List Patient Active Problem List   Diagnosis Date Noted  . Dementia ([place]) 08/14/2017  . Cognitive impairment 09/22/2016  . Morbid obesity ([place])  06/30/2015  . Diabetes ([place]) 04/23/2015  . PCP NOTES >>>>> 12/28/2014  . Leg ulcer, left ([place]) 10/12/2013  . Annual physical exam 12/14/2011  . Angioedema 10/22/2011  . Tracheobronchitis, chronic ([place]) 08/20/2010  . Fatigue 05/22/2010  . CKD (chronic kidney disease) 04/16/2010  . GERD 09/30/2008  . EDEMA 06/10/2008  . Hyperlipidemia 01/25/2007  . ANKLE PAIN, CHRONIC 12/08/2006  . Seasonal and perennial allergic rhinitis 10/20/2006  . HTN (hypertension) 08/30/2006  . OSTEOARTHROSIS, GENERALIZED, MULTIPLE SITES 08/30/2006    Scot Jun 11/17/2018, 11:00 AM  [place] [address] [place], [place], [address] Phone: [phone]   Fax:  [phone]  Name: Jamieka Royle MRN: [id number] Date of Birth: August 13, 1940

## 2018-11-21 ENCOUNTER — Ambulatory Visit: Payer: Medicare Other | Admitting: Physical Therapy

## 2018-11-24 ENCOUNTER — Other Ambulatory Visit: Payer: Self-pay | Admitting: Internal Medicine

## 2018-12-15 ENCOUNTER — Other Ambulatory Visit: Payer: Self-pay | Admitting: Internal Medicine

## 2018-12-15 DIAGNOSIS — Z1231 Encounter for screening mammogram for malignant neoplasm of breast: Secondary | ICD-10-CM

## 2018-12-20 ENCOUNTER — Other Ambulatory Visit: Payer: Self-pay | Admitting: Neurology

## 2018-12-20 DIAGNOSIS — E119 Type 2 diabetes mellitus without complications: Secondary | ICD-10-CM | POA: Diagnosis not present

## 2018-12-20 DIAGNOSIS — Z961 Presence of intraocular lens: Secondary | ICD-10-CM | POA: Diagnosis not present

## 2018-12-20 DIAGNOSIS — H40013 Open angle with borderline findings, low risk, bilateral: Secondary | ICD-10-CM | POA: Diagnosis not present

## 2018-12-20 LAB — HM DIABETES EYE EXAM

## 2018-12-22 ENCOUNTER — Encounter: Payer: Self-pay | Admitting: Internal Medicine

## 2018-12-25 ENCOUNTER — Other Ambulatory Visit: Payer: Self-pay | Admitting: Internal Medicine

## 2018-12-25 MED ORDER — LOSARTAN POTASSIUM 50 MG PO TABS: 50.0000 mg | ORAL_TABLET | Freq: Every day | ORAL | 1 refills | Status: DC

## 2018-12-25 NOTE — Telephone Encounter (Signed)
Medication Refill - Medication: losartan (COZAAR) 50 MG tablet  Has the patient contacted their pharmacy? Yes - states they were told that PCP has to send in new script (Agent: If no, request that the patient contact the pharmacy for the refill.) (Agent: If yes, when and what did the pharmacy advise?)  Preferred Pharmacy (with phone number or street name):  [place], [place] [phone] (Phone) [phone] (Fax)   Agent: Please be advised that RX refills may take up to 3 business days. We ask that you follow-up with your pharmacy.

## 2019-01-08 ENCOUNTER — Other Ambulatory Visit: Payer: Self-pay

## 2019-01-08 ENCOUNTER — Ambulatory Visit (INDEPENDENT_AMBULATORY_CARE_PROVIDER_SITE_OTHER): Payer: Medicare Other | Admitting: Endocrinology

## 2019-01-08 ENCOUNTER — Encounter: Payer: Self-pay | Admitting: Endocrinology

## 2019-01-08 VITALS — BP 110/70 | HR 111 | Ht 65.0 in | Wt 224.0 lb

## 2019-01-08 DIAGNOSIS — Z794 Long term (current) use of insulin: Secondary | ICD-10-CM | POA: Diagnosis not present

## 2019-01-08 DIAGNOSIS — E1142 Type 2 diabetes mellitus with diabetic polyneuropathy: Secondary | ICD-10-CM

## 2019-01-08 DIAGNOSIS — R413 Other amnesia: Secondary | ICD-10-CM

## 2019-01-08 DIAGNOSIS — E1129 Type 2 diabetes mellitus with other diabetic kidney complication: Secondary | ICD-10-CM

## 2019-01-08 DIAGNOSIS — E119 Type 2 diabetes mellitus without complications: Secondary | ICD-10-CM

## 2019-01-08 LAB — POCT GLYCOSYLATED HEMOGLOBIN (HGB A1C): Hemoglobin A1C: 8 % — AB (ref 4.0–5.6)

## 2019-01-08 MED ORDER — TRULICITY 3 MG/0.5ML ~~LOC~~ SOAJ: 3.0000 mg | SUBCUTANEOUS | 3 refills | Status: DC

## 2019-01-08 MED ORDER — INSULIN LISPRO PROT & LISPRO (75-25 MIX) 100 UNIT/ML KWIKPEN: 26.0000 [IU] | PEN_INJECTOR | Freq: Every day | SUBCUTANEOUS | 3 refills | Status: DC

## 2019-01-08 NOTE — Progress Notes (Signed)
Subjective:    Patient ID: Angel Green, female    DOB: February 20, 1941, 78 y.o.   MRN: [id number]  HPI Pt returns for f/u of diabetes mellitus: DM type: Insulin-requiring type 2.   Dx'ed: 6387 Complications: polyneuropathy, and renal insufficiency.   Therapy: insulin since 5643, and trulicity.   GDM: never.  DKA: never.   Severe hypoglycemia: once, in 2019.   Pancreatitis: never.  Other: she changed to QAM premixed insulin, after poor results with multiple daily injections; pattern of cbg's indicates she does not need a PM dose; rx is limited by memory loss; husband sets pen to # of units, and pt administers.   Interval history: no cbg record, but husband states cbg varies from 160-290.  It is in general higher as the day goes on.  No new sxs.   Past Medical History:  Diagnosis Date  . Allergy    Dust Mites  . Ankle pain    chronic  . Diabetes mellitus, type 2 ([place])   . Hyperlipidemia   . Hypertension   . Memory loss   . Osteoarthritis   . RENAL INSUFFICIENCY, CHRONIC 04/16/2010  . Rhinitis    allergic nos    Past Surgical History:  Procedure Laterality Date  . CATARACT EXTRACTION    . CHOLECYSTECTOMY    . dental implants    . PARTIAL HYSTERECTOMY     in her 23s    Social History   Socioeconomic History  . Marital status: Married    Spouse name: Not on file  . Number of children: 2  . Years of education: some college  . Highest education level: Not on file  Occupational History  . Occupation:  retired  Scientific laboratory technician  . Financial resource strain: Not on file  . Food insecurity    Worry: Not on file    Inability: Not on file  . Transportation needs    Medical: Not on file    Non-medical: Not on file  Tobacco Use  . Smoking status: Never Smoker  . Smokeless tobacco: Never Used  Substance and Sexual Activity  . Alcohol use: No    Alcohol/week: 0.0 standard drinks  . Drug use: No  . Sexual activity: Yes    Partners: Male  Lifestyle  . Physical activity     Days per week: Not on file    Minutes per session: Not on file  . Stress: Not on file  Relationships  . Social Herbalist on phone: Not on file    Gets together: Not on file    Attends religious service: Not on file    Active member of club or organization: Not on file    Attends meetings of clubs or organizations: Not on file    Relationship status: Not on file  . Intimate partner violence    Fear of current or ex partner: Not on file    Emotionally abused: Not on file    Physically abused: Not on file    Forced sexual activity: Not on file  Other Topics Concern  . Not on file  Social History Narrative   Husband is a Company secretary.   Right-handed.   1 cup caffeine daily.   Lives at home with husband.           Current Outpatient Medications on File Prior to Visit  Medication Sig Dispense Refill  . amLODipine (NORVASC) 5 MG tablet Take 1 tablet (5 mg total) by mouth daily. 90 tablet  1  . aspirin 81 MG tablet Take 81 mg by mouth daily.      Marland Kitchen atorvastatin (LIPITOR) 10 MG tablet Take 1 tablet (10 mg total) by mouth daily. 90 tablet 2  . azelastine (ASTELIN) 0.1 % nasal spray Place 2 sprays into both nostrils at bedtime as needed for rhinitis. Use in each nostril as directed 90 mL 1  . Blood Glucose Monitoring Suppl (FREESTYLE LITE) DEVI Use to monitor your blood sugars 1 each 0  . cromolyn (NASALCROM) 5.2 MG/ACT nasal spray Place 1 spray into both nostrils 4 (four) times daily. 39 mL 1  . Cyanocobalamin (B-12 PO) Take 1 tablet by mouth daily.     Marland Kitchen EPINEPHrine 0.3 mg/0.3 mL IJ SOAJ injection Inject 0.3 mg into the muscle once. Reported on 06/30/2015    . fluticasone (FLONASE) 50 MCG/ACT nasal spray USE 1 TO 2 SPRAYS IN EACH NOSTRIL DAILY 16 g 3  . fluticasone (FLOVENT HFA) 44 MCG/ACT inhaler Inhale 2 puffs into the lungs 2 (two) times daily. 3 Inhaler 3  . furosemide (LASIX) 20 MG tablet Take 2 tablets (40 mg total) by mouth daily. 180 tablet 1  . glucose blood (FREESTYLE  LITE) test strip Use to monitor your blood sugars 4 times per day, once before each meal and once at bedtime 400 each 12  . Lancets (FREESTYLE) lancets Use to monitor blood sugars 4 times per day, once before each meal and once at bedtime 400 each 12  . loratadine (CLARITIN) 10 MG tablet Take 1 tablet (10 mg total) by mouth daily. 90 tablet 3  . losartan (COZAAR) 50 MG tablet Take 1 tablet (50 mg total) by mouth daily. 90 tablet 1  . memantine (NAMENDA) 10 MG tablet Take 1 tablet (10 mg total) by mouth 2 (two) times daily. 180 tablet 2  . Multiple Vitamins-Minerals (CENTRUM SILVER PO) Take 1 tablet by mouth daily.      Marland Kitchen omeprazole (PRILOSEC) 20 MG capsule Take 1 capsule (20 mg total) by mouth daily. 90 capsule 3  . Polyethyl Glycol-Propyl Glycol (SYSTANE) 0.4-0.3 % SOLN Apply 1 drop to eye daily as needed (for dry eyes).    . SURE COMFORT PEN NEEDLES 31G X 5 MM MISC Use to inject insulin 1 time per day. 100 each 2   No current facility-administered medications on file prior to visit.     Allergies  Allergen Reactions  . Dust Mite Mixed Allergen Ext [Mite (D. Farinae)]     Family History  Problem Relation Age of Onset  . Healthy Brother   . Healthy Son   . Healthy Son   . Other Mother        unsure of history - "old age"  . Other Father        unsure of history - "old age"  . Heart attack Neg Hx   . Colon cancer Neg Hx   . Breast cancer Neg Hx   . Stroke Neg Hx     BP 110/70 (BP Location: Right Wrist, Patient Position: Sitting, Cuff Size: Large)   Pulse (!) 111   Ht 5\' 5"  (1.651 m)   Wt 224 lb (101.6 kg)   SpO2 99%   BMI 37.28 kg/m    Review of Systems She denies hypoglycemia and nausea.    Objective:   Physical Exam VITAL SIGNS:  See vs page GENERAL: no distress Pulses: dorsalis pedis intact bilat.   MSK: no deformity of the feet CV: trace bilat leg edema  Skin:  no ulcer on the feet.  normal color and temp on the feet. Neuro: sensation is intact to touch on the  feet  Lab Results  Component Value Date   HGBA1C 8.0 (A) 01/08/2019   Lab Results  Component Value Date   CREATININE 1.37 (H) 09/13/2018   BUN 17 09/13/2018   NA 141 09/13/2018   K 4.1 09/13/2018   CL 102 09/13/2018   CO2 32 09/13/2018   Lab Results  Component Value Date   TSH 1.69 10/12/2018      Assessment & Plan:  Insulin-requiring type 2 DM, with PN: Needs increased rx, if it can be done with a regimen that avoids or minimizes hypoglycemia.  Renal insuff: in this setting, she needs premixed insulin QAM.  Memory loss: in this setting, she is not a candidate for aggressive glycemic control.    Patient Instructions  I have sent a prescription to your pharmacy: to increase the Trulicity, and to decrease the insulin. check your blood sugar twice a day.  vary the time of day when you check, between before the 3 meals, and at bedtime.  also check if you have symptoms of your blood sugar being too high or too low.  please keep a record of the readings and bring it to your next appointment here (or you can bring the meter itself).  You can write it on any piece of paper.  please call us sooner if your blood sugar goes below 70, or if you have a lot of readings over 200. Please come back for a follow-up appointment in 2 months.

## 2019-01-08 NOTE — Patient Instructions (Signed)
I have sent a prescription to your pharmacy: to increase the Trulicity, and to decrease the insulin. check your blood sugar twice a day.  vary the time of day when you check, between before the 3 meals, and at bedtime.  also check if you have symptoms of your blood sugar being too high or too low.  please keep a record of the readings and bring it to your next appointment here (or you can bring the meter itself).  You can write it on any piece of paper.  please call us sooner if your blood sugar goes below 70, or if you have a lot of readings over 200. Please come back for a follow-up appointment in 2 months.

## 2019-01-09 ENCOUNTER — Other Ambulatory Visit: Payer: Self-pay | Admitting: *Deleted

## 2019-01-09 MED ORDER — QUETIAPINE FUMARATE 25 MG PO TABS: 25.0000 mg | ORAL_TABLET | Freq: Every day | ORAL | 1 refills | Status: DC

## 2019-01-10 ENCOUNTER — Other Ambulatory Visit: Payer: Self-pay | Admitting: Neurology

## 2019-01-12 ENCOUNTER — Other Ambulatory Visit: Payer: Self-pay

## 2019-01-12 DIAGNOSIS — E119 Type 2 diabetes mellitus without complications: Secondary | ICD-10-CM

## 2019-01-12 MED ORDER — FREESTYLE LITE TEST VI STRP: ORAL_STRIP | 12 refills | Status: DC

## 2019-01-30 ENCOUNTER — Ambulatory Visit: Payer: Medicare Other | Admitting: Endocrinology

## 2019-02-03 ENCOUNTER — Other Ambulatory Visit: Payer: Self-pay | Admitting: Internal Medicine

## 2019-02-07 ENCOUNTER — Other Ambulatory Visit: Payer: Self-pay

## 2019-02-07 ENCOUNTER — Ambulatory Visit
Admission: RE | Admit: 2019-02-07 | Discharge: 2019-02-07 | Disposition: A | Discharge status: Home / Self Care | Payer: Medicare Other | Source: Ambulatory Visit | Attending: Internal Medicine | Admitting: Internal Medicine

## 2019-02-07 DIAGNOSIS — Z1231 Encounter for screening mammogram for malignant neoplasm of breast: Secondary | ICD-10-CM

## 2019-02-13 ENCOUNTER — Other Ambulatory Visit: Payer: Self-pay

## 2019-02-14 ENCOUNTER — Other Ambulatory Visit: Payer: Self-pay

## 2019-02-14 ENCOUNTER — Encounter: Payer: Self-pay | Admitting: Internal Medicine

## 2019-02-14 ENCOUNTER — Ambulatory Visit (INDEPENDENT_AMBULATORY_CARE_PROVIDER_SITE_OTHER): Payer: Medicare Other | Admitting: Internal Medicine

## 2019-02-14 VITALS — BP 118/70 | HR 96 | Temp 95.1°F | Resp 18 | Ht 65.0 in | Wt 223.5 lb

## 2019-02-14 DIAGNOSIS — E876 Hypokalemia: Secondary | ICD-10-CM | POA: Diagnosis not present

## 2019-02-14 DIAGNOSIS — N189 Chronic kidney disease, unspecified: Secondary | ICD-10-CM | POA: Diagnosis not present

## 2019-02-14 DIAGNOSIS — I10 Essential (primary) hypertension: Secondary | ICD-10-CM

## 2019-02-14 DIAGNOSIS — F0390 Unspecified dementia without behavioral disturbance: Secondary | ICD-10-CM

## 2019-02-14 LAB — BASIC METABOLIC PANEL
BUN: 20 mg/dL (ref 6–23)
CO2: 27 mEq/L (ref 19–32)
Calcium: 9.6 mg/dL (ref 8.4–10.5)
Chloride: 104 mEq/L (ref 96–112)
Creatinine, Ser: 1.64 mg/dL — ABNORMAL HIGH (ref 0.40–1.20)
GFR: 36.39 mL/min — ABNORMAL LOW (ref >60.00)
Glucose, Bld: 277 mg/dL — ABNORMAL HIGH (ref 70–99)
Potassium: 3.1 mEq/L — ABNORMAL LOW (ref 3.5–5.1)
Sodium: 141 mEq/L (ref 135–145)

## 2019-02-14 NOTE — Progress Notes (Signed)
Pre visit review using our clinic review tool, if applicable. No additional management support is needed unless otherwise documented below in the visit note. 

## 2019-02-14 NOTE — Patient Instructions (Addendum)
GO TO THE LAB : Get the blood work     GO TO THE FRONT DESK Schedule your next appointment for checkup in 6 months    Fall Prevention in the Home, Adult Falls can cause injuries and can affect people from all age groups. There are many simple things that you can do to make your home safe and to help prevent falls. Ask for help when making these changes, if needed. What actions can I take to prevent falls? General instructions  Use good lighting in all rooms. Replace any light bulbs that burn out.  Turn on lights if it is dark. Use night-lights.  Place frequently used items in easy-to-reach places. Lower the shelves around your home if necessary.  Set up furniture so that there are clear paths around it. Avoid moving your furniture around.  Remove throw rugs and other tripping hazards from the floor.  Avoid walking on wet floors.  Fix any uneven floor surfaces.  Add color or contrast paint or tape to grab bars and handrails in your home. Place contrasting color strips on the first and last steps of stairways.  When you use a stepladder, make sure that it is completely opened and that the sides are firmly locked. Have someone hold the ladder while you are using it. Do not climb a closed stepladder.  Be aware of any and all pets. What can I do in the bathroom?      Keep the floor dry. Immediately clean up any water that spills onto the floor.  Remove soap buildup in the tub or shower on a regular basis.  Use non-skid mats or decals on the floor of the tub or shower.  Attach bath mats securely with double-sided, non-slip rug tape.  If you need to sit down while you are in the shower, use a plastic, non-slip stool.  Install grab bars by the toilet and in the tub and shower. Do not use towel bars as grab bars. What can I do in the bedroom?  Make sure that a bedside light is easy to reach.  Do not use oversized bedding that drapes onto the floor.  Have a firm chair that  has side arms to use for getting dressed. What can I do in the kitchen?  Clean up any spills right away.  If you need to reach for something above you, use a sturdy step stool that has a grab bar.  Keep electrical cables out of the way.  Do not use floor polish or wax that makes floors slippery. If you must use wax, make sure that it is non-skid floor wax. What can I do in the stairways?  Do not leave any items on the stairs.  Make sure that you have a light switch at the top of the stairs and the bottom of the stairs. Have them installed if you do not have them.  Make sure that there are handrails on both sides of the stairs. Fix handrails that are broken or loose. Make sure that handrails are as long as the stairways.  Install non-slip stair treads on all stairs in your home.  Avoid having throw rugs at the top or bottom of stairways, or secure the rugs with carpet tape to prevent them from moving.  Choose a carpet design that does not hide the edge of steps on the stairway.  Check any carpeting to make sure that it is firmly attached to the stairs. Fix any carpet that is loose or  worn. What can I do on the outside of my home?  Use bright outdoor lighting.  Regularly repair the edges of walkways and driveways and fix any cracks.  Remove high doorway thresholds.  Trim any shrubbery on the main path into your home.  Regularly check that handrails are securely fastened and in good repair. Both sides of any steps should have handrails.  Install guardrails along the edges of any raised decks or porches.  Clear walkways of debris and clutter, including tools and rocks.  Have leaves, snow, and ice cleared regularly.  Use sand or salt on walkways during winter months.  In the garage, clean up any spills right away, including grease or oil spills. What other actions can I take?  Wear closed-toe shoes that fit well and support your feet. Wear shoes that have rubber soles or  low heels.  Use mobility aids as needed, such as canes, walkers, scooters, and crutches.  Review your medicines with your health care provider. Some medicines can cause dizziness or changes in blood pressure, which increase your risk of falling. Talk with your health care provider about other ways that you can decrease your risk of falls. This may include working with a physical therapist or trainer to improve your strength, balance, and endurance. Where to find more information  Centers for Disease Control and Prevention, [facility]AD[employer]: WebmailGuide.co.za  [employer] on Aging: BrainJudge.co.uk Contact a health care provider if:  You are afraid of falling at home.  You feel weak, drowsy, or dizzy at home.  You fall at home. Summary  There are many simple things that you can do to make your home safe and to help prevent falls.  Ways to make your home safe include removing tripping hazards and installing grab bars in the bathroom.  Ask for help when making these changes in your home. This information is not intended to replace advice given to you by your health care provider. Make sure you discuss any questions you have with your health care provider. Document Released: 01/29/2002 Document Revised: 01/21/2017 Document Reviewed: 09/23/2016 Elsevier Patient Education  2020 [employer].

## 2019-02-14 NOTE — Progress Notes (Signed)
Subjective:    Patient ID: Angel Green, female    DOB: 10-20-1940, 78 y.o.   MRN: [id number]  DOS:  02/14/2019 Type of visit - description: Routine visit. Here with her husband.  In general doing well. 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 Dementia: Good compliance with medicines.  Memory is about the same, no behavioral issues.  Review of Systems  Other than above she does not report any other problems and denies lower extremity paresthesias.  Past Medical History:  Diagnosis Date  . Allergy    Dust Mites  . Ankle pain    chronic  . Diabetes mellitus, type 2 ([place])   . Hyperlipidemia   . Hypertension   . Memory loss   . Osteoarthritis   . RENAL INSUFFICIENCY, CHRONIC 04/16/2010  . Rhinitis    allergic nos    Past Surgical History:  Procedure Laterality Date  . CATARACT EXTRACTION    . CHOLECYSTECTOMY    . dental implants    . PARTIAL HYSTERECTOMY     in her 57s    Social History   Socioeconomic History  . Marital status: Married    Spouse name: Not on file  . Number of children: 2  . Years of education: some college  . Highest education level: Not on file  Occupational History  . Occupation:  retired  Tobacco Use  . Smoking status: Never Smoker  . Smokeless tobacco: Never Used  Substance and Sexual Activity  . Alcohol use: No    Alcohol/week: 0.0 standard drinks  . Drug use: No  . Sexual activity: Yes    Partners: Male  Other Topics Concern  . Not on file  Social History Narrative   Husband is a Company secretary.   Right-handed.   1 cup caffeine daily.   Lives at home with husband.          Social Determinants of Health   Financial Resource Strain:   . Difficulty of Paying Living Expenses: Not on file  Food Insecurity:   . Worried About Charity fundraiser in the Last Year: Not on file  . Ran Out of Food in the Last Year: Not on file  Transportation Needs:   . Lack of Transportation (Medical):  Not on file  . Lack of Transportation (Non-Medical): Not on file  Physical Activity:   . Days of Exercise per Week: Not on file  . Minutes of Exercise per Session: Not on file  Stress:   . Feeling of Stress : Not on file  Social Connections:   . Frequency of Communication with Friends and Family: Not on file  . Frequency of Social Gatherings with Friends and Family: Not on file  . Attends Religious Services: Not on file  . Active Member of Clubs or Organizations: Not on file  . Attends Archivist Meetings: Not on file  . Marital Status: Not on file  Intimate Partner Violence:   . Fear of Current or Ex-Partner: Not on file  . Emotionally Abused: Not on file  . Physically Abused: Not on file  . Sexually Abused: Not on file      Allergies as of 02/14/2019      Reactions   Dust Mite Mixed Allergen Ext [mite (d. Farinae)]       Medication List       Accurate as of February 14, 2019 10:17 AM. If you have any questions, ask your nurse or doctor.  amLODipine 5 MG tablet Commonly known as: NORVASC Take 1 tablet (5 mg total) by mouth daily.   aspirin 81 MG tablet Take 81 mg by mouth daily.   atorvastatin 10 MG tablet Commonly known as: LIPITOR Take 1 tablet (10 mg total) by mouth daily.   azelastine 0.1 % nasal spray Commonly known as: ASTELIN Place 2 sprays into both nostrils at bedtime as needed for rhinitis. Use in each nostril as directed   B-12 PO Take 1 tablet by mouth daily.   CENTRUM SILVER PO Take 1 tablet by mouth daily.   cromolyn 5.2 MG/ACT nasal spray Commonly known as: NasalCrom Place 1 spray into both nostrils 4 (four) times daily.   donepezil 10 MG tablet Commonly known as: ARICEPT Take 1 tablet (10 mg total) by mouth at bedtime. Please call [phone] to schedule follow up appt.   EPINEPHrine 0.3 mg/0.3 mL Soaj injection Commonly known as: EPI-PEN Inject 0.3 mg into the muscle once. Reported on 06/30/2015   fluticasone 44 MCG/ACT  inhaler Commonly known as: Flovent HFA Inhale 2 puffs into the lungs 2 (two) times daily.   fluticasone 50 MCG/ACT nasal spray Commonly known as: FLONASE USE 1 TO 2 SPRAYS IN EACH NOSTRIL DAILY   freestyle lancets Use to monitor blood sugars 4 times per day, once before each meal and once at bedtime   FreeStyle Lite Devi Use to monitor your blood sugars   FREESTYLE LITE test strip Generic drug: glucose blood Use to monitor your blood sugars 4 times per day; E11.9   furosemide 20 MG tablet Commonly known as: LASIX Take 2 tablets (40 mg total) by mouth daily.   Insulin Lispro Prot & Lispro (75-25) 100 UNIT/ML Kwikpen Commonly known as: HumaLOG Mix 75/25 KwikPen Inject 26 Units into the skin daily with breakfast. And pen needles 1/day   loratadine 10 MG tablet Commonly known as: CLARITIN Take 1 tablet (10 mg total) by mouth daily.   losartan 50 MG tablet Commonly known as: COZAAR Take 1 tablet (50 mg total) by mouth daily.   memantine 10 MG tablet Commonly known as: NAMENDA Take 1 tablet (10 mg total) by mouth 2 (two) times daily.   omeprazole 20 MG capsule Commonly known as: PRILOSEC Take 1 capsule (20 mg total) by mouth daily.   QUEtiapine 25 MG tablet Commonly known as: SEROquel Take 1 tablet (25 mg total) by mouth at bedtime. Please call [phone] to schedule follow up appt.   Sure Comfort Pen Needles 31G X 5 MM Misc Generic drug: Insulin Pen Needle Use to inject insulin 1 time per day.   Systane 0.4-0.3 % Soln Generic drug: Polyethyl Glycol-Propyl Glycol Apply 1 drop to eye daily as needed (for dry eyes).   Trulicity 3 NF/6.2ZH Sopn Generic drug: Dulaglutide Inject 3 mg into the skin once a week.           Objective:   Physical Exam BP 118/70 (BP Location: Left Arm, Patient Position: Sitting, Cuff Size: Large)   Pulse 96   Temp (!) 95.1 F (35.1 C) (Temporal)   Resp 18   Ht 5\' 5"  (1.651 m)   Wt 223 lb 8 oz (101.4 kg)   SpO2 100%   BMI 37.19  kg/m  General:   Well developed, NAD, BMI noted. HEENT:  Normocephalic . Face symmetric, atraumatic Lungs:  CTA B Normal respiratory effort, no intercostal retractions, no accessory muscle use. Heart: RRR,  no murmur.  No pretibial edema bilaterally  Skin: Not pale. Not  jaundice Neurologic:  alert & did not recall her breakfast, not oriented in time.  She did recognize me, recognizes her husband, Speech  is slow, some difficulty understanding questions. Psych--  Cognition and judgment appear intact.  Cooperative with normal attention span and concentration.  Behavior appropriate. No anxious or depressed appearing.      Assessment    Assessment   DM  Dr Loanne Drilling + Neuropathy: Per foot exam 12-2014 HTN ---change ACE to ARB is 03/2014 Hyperlipidemia CRI  dx 2012  Sees Dr Arty Baumgartner  Morbid obesity DJD, had  chronic ankle pain Allergies -- dust mites , occ uses a inhaler , sees allergist, has shots q weeks started ~ 08-2014 Dementia:  MMSE 7/218: 24, rx Aricept.  B12, RPR, sed rate and TSH normal MMSE 07/2017 :13 worse MRI brain 6/19:  prominent right temporal lobe volume loss.  PLAN DM: Per Dr. Loanne Drilling Gait disorder: S/p physical therapy, it helped some but is still reports frustration for not being able to do as much as she once could.  Patient is counseled.  Fall prevention discussed Dementia: On Namenda, Aricept, last visit with neurology 06/2018, recommend   to check with neurology to see when/if she has a follow-up.  Seems stable at this point. Chronic renal insufficiency: Check BMP HTN: Seems controlled on Amlodipine, Lasix, losartan.  No change RTC 6 months   This visit occurred during the SARS-CoV-2 public health emergency.  Safety protocols were in place, including screening questions prior to the visit, additional usage of staff PPE, and extensive cleaning of exam room while observing appropriate contact time as indicated for disinfecting solutions.

## 2019-02-15 ENCOUNTER — Encounter: Payer: Self-pay | Admitting: Internal Medicine

## 2019-02-15 NOTE — Assessment & Plan Note (Signed)
DM: Per Dr. Loanne Drilling Gait disorder: S/p physical therapy, it helped some but is still reports frustration for not being able to do as much as she once could.  Patient is counseled.  Fall prevention discussed Dementia: On Namenda, Aricept, last visit with neurology 06/2018, recommend   to check with neurology to see when/if she has a follow-up.  Seems stable at this point. Chronic renal insufficiency: Check BMP HTN: Seems controlled on Amlodipine, Lasix, losartan.  No change RTC 6 months

## 2019-02-18 ENCOUNTER — Other Ambulatory Visit: Payer: Self-pay | Admitting: Internal Medicine

## 2019-02-19 MED ORDER — POTASSIUM CHLORIDE ER 10 MEQ PO TBCR: 10.0000 meq | EXTENDED_RELEASE_TABLET | Freq: Every day | ORAL | 0 refills | Status: DC

## 2019-02-19 NOTE — Addendum Note (Signed)
Addended by: Wynonia Musty A on: 02/19/2019 11:48 AM   Modules accepted: Orders

## 2019-03-07 ENCOUNTER — Other Ambulatory Visit: Payer: Self-pay

## 2019-03-09 ENCOUNTER — Ambulatory Visit (INDEPENDENT_AMBULATORY_CARE_PROVIDER_SITE_OTHER): Payer: Medicare Other | Admitting: Endocrinology

## 2019-03-09 ENCOUNTER — Encounter: Payer: Self-pay | Admitting: Endocrinology

## 2019-03-09 VITALS — BP 124/60 | HR 110 | Ht 65.0 in | Wt 229.6 lb

## 2019-03-09 DIAGNOSIS — E119 Type 2 diabetes mellitus without complications: Secondary | ICD-10-CM

## 2019-03-09 LAB — POCT GLYCOSYLATED HEMOGLOBIN (HGB A1C): Hemoglobin A1C: 8.7 % — AB (ref 4.0–5.6)

## 2019-03-09 MED ORDER — INSULIN LISPRO PROT & LISPRO (75-25 MIX) 100 UNIT/ML KWIKPEN: 30.0000 [IU] | PEN_INJECTOR | Freq: Every day | SUBCUTANEOUS | 3 refills | Status: DC

## 2019-03-09 MED ORDER — INSULIN LISPRO PROT & LISPRO (75-25 MIX) 100 UNIT/ML KWIKPEN: 28.0000 [IU] | PEN_INJECTOR | Freq: Every day | SUBCUTANEOUS | 3 refills | Status: DC

## 2019-03-09 NOTE — Patient Instructions (Addendum)
Please increase the insulin to 30 units each morning, and: Please continue the same Trulicity (please do not buy any more of this, as we may increase it next time. Please come back for a follow-up appointment in 7-8 weeks. check your blood sugar twice a day.  vary the time of day when you check, between before the 3 meals, and at bedtime.  also check if you have symptoms of your blood sugar being too high or too low.  please keep a record of the readings and bring it to your next appointment here (or you can bring the meter itself).  You can write it on any piece of paper.  please call us sooner if your blood sugar goes below 70, or if you have a lot of readings over 200.

## 2019-03-09 NOTE — Progress Notes (Signed)
Subjective:    Patient ID: Angel Green, female    DOB: [birth year], 79 y.o.   MRN: [id number]  HPI Pt returns for f/u of diabetes mellitus: DM type: Insulin-requiring type 2.   Dx'ed: 8469 Complications: polyneuropathy, and renal insufficiency.   Therapy: insulin since 6295, and trulicity.   GDM: never.  DKA: never.   Severe hypoglycemia: once, in 2019.   Pancreatitis: never.  SDOH: rx is limited by memory loss; husband sets pen to # of units, and pt administers. Other: she changed to QAM premixed insulin, after poor results with multiple daily injections; pattern of cbg's indicates she does not need a PM dose.   Interval history: no cbg record, but husband states cbg varies from 200-300.  No new sxs.   Past Medical History:  Diagnosis Date  . Allergy    Dust Mites  . Ankle pain    chronic  . Diabetes mellitus, type 2 ([place])   . Hyperlipidemia   . Hypertension   . Memory loss   . Osteoarthritis   . RENAL INSUFFICIENCY, CHRONIC 04/16/2010  . Rhinitis    allergic nos    Past Surgical History:  Procedure Laterality Date  . CATARACT EXTRACTION    . CHOLECYSTECTOMY    . dental implants    . PARTIAL HYSTERECTOMY     in her 48s    Social History   Socioeconomic History  . Marital status: Married    Spouse name: Not on file  . Number of children: 2  . Years of education: some college  . Highest education level: Not on file  Occupational History  . Occupation:  retired  Tobacco Use  . Smoking status: Never Smoker  . Smokeless tobacco: Never Used  Substance and Sexual Activity  . Alcohol use: No    Alcohol/week: 0.0 standard drinks  . Drug use: No  . Sexual activity: Yes    Partners: Male  Other Topics Concern  . Not on file  Social History Narrative   Husband is a Company secretary.   Right-handed.   1 cup caffeine daily.   Lives at home with husband.          Social Determinants of Health   Financial Resource Strain:   . Difficulty of Paying Living  Expenses: Not on file  Food Insecurity:   . Worried About Charity fundraiser in the Last Year: Not on file  . Ran Out of Food in the Last Year: Not on file  Transportation Needs:   . Lack of Transportation (Medical): Not on file  . Lack of Transportation (Non-Medical): Not on file  Physical Activity:   . Days of Exercise per Week: Not on file  . Minutes of Exercise per Session: Not on file  Stress:   . Feeling of Stress : Not on file  Social Connections:   . Frequency of Communication with Friends and Family: Not on file  . Frequency of Social Gatherings with Friends and Family: Not on file  . Attends Religious Services: Not on file  . Active Member of Clubs or Organizations: Not on file  . Attends Archivist Meetings: Not on file  . Marital Status: Not on file  Intimate Partner Violence:   . Fear of Current or Ex-Partner: Not on file  . Emotionally Abused: Not on file  . Physically Abused: Not on file  . Sexually Abused: Not on file    Current Outpatient Medications on File Prior to Visit  Medication Sig  Dispense Refill  . amLODipine (NORVASC) 5 MG tablet Take 1 tablet (5 mg total) by mouth daily. 90 tablet 1  . aspirin 81 MG tablet Take 81 mg by mouth daily.      Marland Kitchen atorvastatin (LIPITOR) 10 MG tablet Take 1 tablet (10 mg total) by mouth daily. 90 tablet 2  . azelastine (ASTELIN) 0.1 % nasal spray Place 2 sprays into both nostrils at bedtime as needed for rhinitis. Use in each nostril as directed 90 mL 1  . Blood Glucose Monitoring Suppl (FREESTYLE LITE) DEVI Use to monitor your blood sugars 1 each 0  . cromolyn (NASALCROM) 5.2 MG/ACT nasal spray Place 1 spray into both nostrils 4 (four) times daily. 39 mL 1  . Cyanocobalamin (B-12 PO) Take 1 tablet by mouth daily.     Marland Kitchen donepezil (ARICEPT) 10 MG tablet Take 1 tablet (10 mg total) by mouth at bedtime. Please call [phone] to schedule follow up appt. 90 tablet 1  . Dulaglutide (TRULICITY) 3 FI/4.3PI SOPN Inject 3 mg  into the skin once a week. 12 pen 3  . EPINEPHrine 0.3 mg/0.3 mL IJ SOAJ injection Inject 0.3 mg into the muscle once. Reported on 06/30/2015    . fluticasone (FLONASE) 50 MCG/ACT nasal spray USE 1 TO 2 SPRAYS IN EACH NOSTRIL DAILY 16 g 3  . fluticasone (FLOVENT HFA) 44 MCG/ACT inhaler Inhale 2 puffs into the lungs 2 (two) times daily. 3 Inhaler 3  . furosemide (LASIX) 20 MG tablet Take 2 tablets (40 mg total) by mouth daily. 180 tablet 1  . glucose blood (FREESTYLE LITE) test strip Use to monitor your blood sugars 4 times per day; E11.9 400 each 12  . Lancets (FREESTYLE) lancets Use to monitor blood sugars 4 times per day, once before each meal and once at bedtime 400 each 12  . loratadine (CLARITIN) 10 MG tablet TAKE 1 TABLET DAILY 90 tablet 3  . losartan (COZAAR) 50 MG tablet Take 1 tablet (50 mg total) by mouth daily. 90 tablet 1  . memantine (NAMENDA) 10 MG tablet Take 1 tablet (10 mg total) by mouth 2 (two) times daily. 180 tablet 2  . Multiple Vitamins-Minerals (CENTRUM SILVER PO) Take 1 tablet by mouth daily.      Marland Kitchen omeprazole (PRILOSEC) 20 MG capsule Take 1 capsule (20 mg total) by mouth daily. 90 capsule 3  . Polyethyl Glycol-Propyl Glycol (SYSTANE) 0.4-0.3 % SOLN Apply 1 drop to eye daily as needed (for dry eyes).    . potassium chloride (KLOR-CON) 10 MEQ tablet Take 1 tablet (10 mEq total) by mouth daily. 7 tablet 0  . QUEtiapine (SEROQUEL) 25 MG tablet Take 1 tablet (25 mg total) by mouth at bedtime. Please call [phone] to schedule follow up appt. 90 tablet 1  . SURE COMFORT PEN NEEDLES 31G X 5 MM MISC Use to inject insulin 1 time per day. 100 each 2   No current facility-administered medications on file prior to visit.    Allergies  Allergen Reactions  . Dust Mite Mixed Allergen Ext [Mite (D. Farinae)]     Family History  Problem Relation Age of Onset  . Healthy Brother   . Healthy Son   . Healthy Son   . Other Mother        unsure of history - "old age"  . Other Father          unsure of history - "old age"  . Heart attack Neg Hx   . Colon cancer Neg  Hx   . Breast cancer Neg Hx   . Stroke Neg Hx     BP 124/60 (BP Location: Left Arm, Patient Position: Sitting, Cuff Size: Large)   Pulse (!) 110   Ht 5\' 5"  (1.651 m)   Wt 229 lb 9.6 oz (104.1 kg)   SpO2 93%   BMI 38.21 kg/m    Review of Systems Pt/husband deny hypoglycemia and nausea.  She has gained weight.      Objective:   Physical Exam VITAL SIGNS:  See vs page GENERAL: no distress Pulses: dorsalis pedis intact bilat.   MSK: no deformity of the feet CV: no leg edema.  Skin:  no ulcer on the feet.  normal color and temp on the feet.  Neuro: sensation is intact to touch on the feet.     Lab Results  Component Value Date   TSH 1.69 10/12/2018   Lab Results  Component Value Date   HGBA1C 8.7 (A) 03/09/2019   Lab Results  Component Value Date   CREATININE 1.64 (H) 02/14/2019   BUN 20 02/14/2019   NA 141 02/14/2019   K 3.1 (L) 02/14/2019   CL 104 02/14/2019   CO2 27 02/14/2019      Assessment & Plan:  Tachycardia.  uncertain etiology.  Recheck next time.   Insulin-requiring type 2 DM, with PN: worse SDOH: rx is limited by memory loss  Patient Instructions  Please increase the insulin to 30 units each morning, and: Please continue the same Trulicity (please do not buy any more of this, as we may increase it next time. Please come back for a follow-up appointment in 7-8 weeks. check your blood sugar twice a day.  vary the time of day when you check, between before the 3 meals, and at bedtime.  also check if you have symptoms of your blood sugar being too high or too low.  please keep a record of the readings and bring it to your next appointment here (or you can bring the meter itself).  You can write it on any piece of paper.  please call us sooner if your blood sugar goes below 70, or if you have a lot of readings over 200.

## 2019-03-13 DIAGNOSIS — E669 Obesity, unspecified: Secondary | ICD-10-CM | POA: Diagnosis not present

## 2019-03-13 DIAGNOSIS — N183 Chronic kidney disease, stage 3 unspecified: Secondary | ICD-10-CM | POA: Diagnosis not present

## 2019-03-13 DIAGNOSIS — E785 Hyperlipidemia, unspecified: Secondary | ICD-10-CM | POA: Diagnosis not present

## 2019-03-13 DIAGNOSIS — I129 Hypertensive chronic kidney disease with stage 1 through stage 4 chronic kidney disease, or unspecified chronic kidney disease: Secondary | ICD-10-CM | POA: Diagnosis not present

## 2019-03-13 DIAGNOSIS — D631 Anemia in chronic kidney disease: Secondary | ICD-10-CM | POA: Diagnosis not present

## 2019-03-13 DIAGNOSIS — N2581 Secondary hyperparathyroidism of renal origin: Secondary | ICD-10-CM | POA: Diagnosis not present

## 2019-03-13 DIAGNOSIS — E1122 Type 2 diabetes mellitus with diabetic chronic kidney disease: Secondary | ICD-10-CM | POA: Diagnosis not present

## 2019-03-14 DIAGNOSIS — N2581 Secondary hyperparathyroidism of renal origin: Secondary | ICD-10-CM | POA: Diagnosis not present

## 2019-03-14 DIAGNOSIS — N189 Chronic kidney disease, unspecified: Secondary | ICD-10-CM | POA: Diagnosis not present

## 2019-03-14 DIAGNOSIS — N183 Chronic kidney disease, stage 3 unspecified: Secondary | ICD-10-CM | POA: Diagnosis not present

## 2019-04-30 ENCOUNTER — Other Ambulatory Visit: Payer: Self-pay

## 2019-05-02 ENCOUNTER — Encounter: Payer: Self-pay | Admitting: Endocrinology

## 2019-05-02 ENCOUNTER — Other Ambulatory Visit: Payer: Self-pay

## 2019-05-02 ENCOUNTER — Ambulatory Visit (INDEPENDENT_AMBULATORY_CARE_PROVIDER_SITE_OTHER): Payer: Medicare Other | Admitting: Endocrinology

## 2019-05-02 VITALS — BP 126/80 | HR 103 | Ht 65.0 in | Wt 221.8 lb

## 2019-05-02 DIAGNOSIS — E119 Type 2 diabetes mellitus without complications: Secondary | ICD-10-CM

## 2019-05-02 LAB — POCT GLYCOSYLATED HEMOGLOBIN (HGB A1C): Hemoglobin A1C: 8.8 % — AB (ref 4.0–5.6)

## 2019-05-02 MED ORDER — TRULICITY 4.5 MG/0.5ML ~~LOC~~ SOAJ: 4.5000 mg | SUBCUTANEOUS | 3 refills | Status: DC

## 2019-05-02 NOTE — Patient Instructions (Addendum)
Please increase the same Trulicity.  I have sent a prescription to your pharmacy.   You can use up the current 3 mg pens, by taking 1 shot every 5 days.  Please come back for a follow-up appointment in 2 months.  check your blood sugar twice a day.  vary the time of day when you check, between before the 3 meals, and at bedtime.  also check if you have symptoms of your blood sugar being too high or too low.  please keep a record of the readings and bring it to your next appointment here (or you can bring the meter itself).  You can write it on any piece of paper.  please call us sooner if your blood sugar goes below 70, or if you have a lot of readings over 200.

## 2019-05-02 NOTE — Progress Notes (Signed)
Subjective:    Patient ID: Angel Green, female    DOB: 1940-06-24, 79 y.o.   MRN: [id number]  HPI Pt returns for f/u of diabetes mellitus: DM type: Insulin-requiring type 2.   Dx'ed: 4081 Complications: PN, and CRI.   Therapy: insulin since 4481, and trulicity.   GDM: never.  DKA: never.   Severe hypoglycemia: once, in 2019.   Pancreatitis: never.  SDOH: rx is limited by memory loss; husband sets pen to # of units, and pt administers. Other: she changed to QAM premixed insulin, after poor results with multiple daily injections; pattern of cbg's indicates she does not need a PM dose.   Interval history: no cbg record, but husband states cbg varies from 200-300.  No new sxs.  Past Medical History:  Diagnosis Date  . Allergy    Dust Mites  . Ankle pain    chronic  . Diabetes mellitus, type 2 ([place])   . Hyperlipidemia   . Hypertension   . Memory loss   . Osteoarthritis   . RENAL INSUFFICIENCY, CHRONIC 04/16/2010  . Rhinitis    allergic nos    Past Surgical History:  Procedure Laterality Date  . CATARACT EXTRACTION    . CHOLECYSTECTOMY    . dental implants    . PARTIAL HYSTERECTOMY     in her 71s    Social History   Socioeconomic History  . Marital status: Married    Spouse name: Not on file  . Number of children: 2  . Years of education: some college  . Highest education level: Not on file  Occupational History  . Occupation:  retired  Tobacco Use  . Smoking status: Never Smoker  . Smokeless tobacco: Never Used  Substance and Sexual Activity  . Alcohol use: No    Alcohol/week: 0.0 standard drinks  . Drug use: No  . Sexual activity: Yes    Partners: Male  Other Topics Concern  . Not on file  Social History Narrative   Husband is a Company secretary.   Right-handed.   1 cup caffeine daily.   Lives at home with husband.          Social Determinants of Health   Financial Resource Strain:   . Difficulty of Paying Living Expenses:   Food Insecurity:   .  Worried About Charity fundraiser in the Last Year:   . Arboriculturist in the Last Year:   Transportation Needs:   . Film/video editor (Medical):   Marland Kitchen Lack of Transportation (Non-Medical):   Physical Activity:   . Days of Exercise per Week:   . Minutes of Exercise per Session:   Stress:   . Feeling of Stress :   Social Connections:   . Frequency of Communication with Friends and Family:   . Frequency of Social Gatherings with Friends and Family:   . Attends Religious Services:   . Active Member of Clubs or Organizations:   . Attends Archivist Meetings:   Marland Kitchen Marital Status:   Intimate Partner Violence:   . Fear of Current or Ex-Partner:   . Emotionally Abused:   Marland Kitchen Physically Abused:   . Sexually Abused:     Current Outpatient Medications on File Prior to Visit  Medication Sig Dispense Refill  . amLODipine (NORVASC) 5 MG tablet Take 1 tablet (5 mg total) by mouth daily. 90 tablet 1  . aspirin 81 MG tablet Take 81 mg by mouth daily.      Marland Kitchen  atorvastatin (LIPITOR) 10 MG tablet Take 1 tablet (10 mg total) by mouth daily. 90 tablet 2  . azelastine (ASTELIN) 0.1 % nasal spray Place 2 sprays into both nostrils at bedtime as needed for rhinitis. Use in each nostril as directed 90 mL 1  . Blood Glucose Monitoring Suppl (FREESTYLE LITE) DEVI Use to monitor your blood sugars 1 each 0  . cromolyn (NASALCROM) 5.2 MG/ACT nasal spray Place 1 spray into both nostrils 4 (four) times daily. 39 mL 1  . Cyanocobalamin (B-12 PO) Take 1 tablet by mouth daily.     Marland Kitchen donepezil (ARICEPT) 10 MG tablet Take 1 tablet (10 mg total) by mouth at bedtime. Please call [phone] to schedule follow up appt. 90 tablet 1  . EPINEPHrine 0.3 mg/0.3 mL IJ SOAJ injection Inject 0.3 mg into the muscle once. Reported on 06/30/2015    . fluticasone (FLONASE) 50 MCG/ACT nasal spray USE 1 TO 2 SPRAYS IN EACH NOSTRIL DAILY 16 g 3  . fluticasone (FLOVENT HFA) 44 MCG/ACT inhaler Inhale 2 puffs into the lungs 2 (two)  times daily. 3 Inhaler 3  . furosemide (LASIX) 20 MG tablet Take 2 tablets (40 mg total) by mouth daily. 180 tablet 1  . glucose blood (FREESTYLE LITE) test strip Use to monitor your blood sugars 4 times per day; E11.9 400 each 12  . Insulin Lispro Prot & Lispro (HUMALOG MIX 75/25 KWIKPEN) (75-25) 100 UNIT/ML Kwikpen Inject 30 Units into the skin daily with breakfast. And pen needles 1/day 15 pen 3  . Lancets (FREESTYLE) lancets Use to monitor blood sugars 4 times per day, once before each meal and once at bedtime 400 each 12  . loratadine (CLARITIN) 10 MG tablet TAKE 1 TABLET DAILY 90 tablet 3  . losartan (COZAAR) 50 MG tablet Take 1 tablet (50 mg total) by mouth daily. 90 tablet 1  . memantine (NAMENDA) 10 MG tablet Take 1 tablet (10 mg total) by mouth 2 (two) times daily. 180 tablet 2  . Multiple Vitamins-Minerals (CENTRUM SILVER PO) Take 1 tablet by mouth daily.      Marland Kitchen omeprazole (PRILOSEC) 20 MG capsule Take 1 capsule (20 mg total) by mouth daily. 90 capsule 3  . Polyethyl Glycol-Propyl Glycol (SYSTANE) 0.4-0.3 % SOLN Apply 1 drop to eye daily as needed (for dry eyes).    . potassium chloride (KLOR-CON) 10 MEQ tablet Take 1 tablet (10 mEq total) by mouth daily. 7 tablet 0  . QUEtiapine (SEROQUEL) 25 MG tablet Take 1 tablet (25 mg total) by mouth at bedtime. Please call [phone] to schedule follow up appt. 90 tablet 1  . SURE COMFORT PEN NEEDLES 31G X 5 MM MISC Use to inject insulin 1 time per day. 100 each 2   No current facility-administered medications on file prior to visit.    Allergies  Allergen Reactions  . Dust Mite Mixed Allergen Ext [Mite (D. Farinae)]     Family History  Problem Relation Age of Onset  . Healthy Brother   . Healthy Son   . Healthy Son   . Other Mother        unsure of history - "old age"  . Other Father        unsure of history - "old age"  . Heart attack Neg Hx   . Colon cancer Neg Hx   . Breast cancer Neg Hx   . Stroke Neg Hx     BP 126/80 (BP  Location: Right Arm, Patient Position: Sitting, Cuff  Size: Large)   Pulse (!) 103   Ht 5\' 5"  (1.651 m)   Wt 221 lb 12.8 oz (100.6 kg)   SpO2 95%   BMI 36.91 kg/m    Review of Systems She denies hypoglycemia and nausea.      Objective:   Physical Exam VITAL SIGNS:  See vs page GENERAL: no distress Pulses: dorsalis pedis intact bilat.   MSK: no deformity of the feet CV: trace bilat leg edema Skin:  no ulcer on the feet, but she skin [place] dry.  normal color and temp on the feet. Neuro: sensation is intact to touch on the feet    Lab Results  Component Value Date   HGBA1C 8.8 (A) 05/02/2019   Lab Results  Component Value Date   CREATININE 1.64 (H) 02/14/2019   BUN 20 02/14/2019   NA 141 02/14/2019   K 3.1 (L) 02/14/2019   CL 104 02/14/2019   CO2 27 02/14/2019   Lab Results  Component Value Date   TSH 1.69 10/12/2018       Assessment & Plan:  Insulin-requiring type 2 DM, with PN: worse CRI: This limits rx options Memory loss: she is not a candidate for aggressive glycemic control.  Patient Instructions  Please increase the same Trulicity.  I have sent a prescription to your pharmacy.   You can use up the current 3 mg pens, by taking 1 shot every 5 days.  Please come back for a follow-up appointment in 2 months.  check your blood sugar twice a day.  vary the time of day when you check, between before the 3 meals, and at bedtime.  also check if you have symptoms of your blood sugar being too high or too low.  please keep a record of the readings and bring it to your next appointment here (or you can bring the meter itself).  You can write it on any piece of paper.  please call us sooner if your blood sugar goes below 70, or if you have a lot of readings over 200.

## 2019-06-14 ENCOUNTER — Other Ambulatory Visit: Payer: Self-pay | Admitting: Internal Medicine

## 2019-06-20 ENCOUNTER — Other Ambulatory Visit: Payer: Self-pay | Admitting: Neurology

## 2019-07-02 ENCOUNTER — Other Ambulatory Visit: Payer: Self-pay | Admitting: Neurology

## 2019-07-03 ENCOUNTER — Ambulatory Visit: Payer: Medicare Other | Admitting: Endocrinology

## 2019-07-03 DIAGNOSIS — Z0289 Encounter for other administrative examinations: Secondary | ICD-10-CM

## 2019-07-03 DIAGNOSIS — E119 Type 2 diabetes mellitus without complications: Secondary | ICD-10-CM

## 2019-07-30 ENCOUNTER — Other Ambulatory Visit: Payer: Self-pay | Admitting: Internal Medicine

## 2019-08-02 ENCOUNTER — Other Ambulatory Visit: Payer: Self-pay | Admitting: Internal Medicine

## 2019-08-03 ENCOUNTER — Telehealth: Payer: Self-pay | Admitting: Neurology

## 2019-08-03 MED ORDER — QUETIAPINE FUMARATE 25 MG PO TABS: 25.0000 mg | ORAL_TABLET | Freq: Every day | ORAL | 0 refills | Status: DC

## 2019-08-03 NOTE — Telephone Encounter (Signed)
Pending appt 08/07/19. Refill sent to the pharmacy.

## 2019-08-03 NOTE — Telephone Encounter (Signed)
Pt is needing a refill on her QUEtiapine (SEROQUEL) 25 MG tablet sent in to the [facility]. Pt is scheduled to be seen next week.

## 2019-08-07 ENCOUNTER — Ambulatory Visit: Payer: Medicare Other | Admitting: Neurology

## 2019-08-07 NOTE — Progress Notes (Deleted)
PATIENT: Angel Green DOB: [birth year]  REASON FOR VISIT: follow up HISTORY FROM: patient  HISTORY OF PRESENT ILLNESS: Today 08/07/19  HISTORY   REVIEW OF SYSTEMS: Out of a complete 14 system review of symptoms, the patient complains only of the following symptoms, and all other reviewed systems are negative.  ALLERGIES: Allergies  Allergen Reactions  . Dust Mite Mixed Allergen Ext [Mite (D. Farinae)]     HOME MEDICATIONS: Outpatient Medications Prior to Visit  Medication Sig Dispense Refill  . amLODipine (NORVASC) 5 MG tablet Take 1 tablet (5 mg total) by mouth daily. 90 tablet 1  . aspirin 81 MG tablet Take 81 mg by mouth daily.      Marland Kitchen atorvastatin (LIPITOR) 10 MG tablet Take 1 tablet (10 mg total) by mouth daily. 90 tablet 0  . azelastine (ASTELIN) 0.1 % nasal spray Place 2 sprays into both nostrils at bedtime as needed for rhinitis. Use in each nostril as directed 90 mL 1  . Blood Glucose Monitoring Suppl (FREESTYLE LITE) DEVI Use to monitor your blood sugars 1 each 0  . cromolyn (NASALCROM) 5.2 MG/ACT nasal spray Place 1 spray into both nostrils 4 (four) times daily. 39 mL 1  . Cyanocobalamin (B-12 PO) Take 1 tablet by mouth daily.     Marland Kitchen donepezil (ARICEPT) 10 MG tablet Take 1 tablet (10 mg total) by mouth at bedtime. Please call [phone] to schedule follow up appt. 90 tablet 1  . Dulaglutide (TRULICITY) 4.5 ZY/6.0YT SOPN Inject 4.5 mg into the skin once a week. 12 pen 3  . EPINEPHrine 0.3 mg/0.3 mL IJ SOAJ injection Inject 0.3 mg into the muscle once. Reported on 06/30/2015    . fluticasone (FLONASE) 50 MCG/ACT nasal spray USE 1 TO 2 SPRAYS IN EACH NOSTRIL DAILY 16 g 3  . fluticasone (FLOVENT HFA) 44 MCG/ACT inhaler Inhale 2 puffs into the lungs 2 (two) times daily. 3 Inhaler 3  . furosemide (LASIX) 20 MG tablet Take 2 tablets (40 mg total) by mouth daily. 180 tablet 1  . glucose blood (FREESTYLE LITE) test strip Use to monitor your blood sugars 4 times per day; E11.9  400 each 12  . Insulin Lispro Prot & Lispro (HUMALOG MIX 75/25 KWIKPEN) (75-25) 100 UNIT/ML Kwikpen Inject 30 Units into the skin daily with breakfast. And pen needles 1/day 15 pen 3  . Lancets (FREESTYLE) lancets Use to monitor blood sugars 4 times per day, once before each meal and once at bedtime 400 each 12  . loratadine (CLARITIN) 10 MG tablet TAKE 1 TABLET DAILY 90 tablet 3  . losartan (COZAAR) 50 MG tablet Take 1 tablet (50 mg total) by mouth daily. 90 tablet 1  . memantine (NAMENDA) 10 MG tablet Take 1 tablet (10 mg total) by mouth 2 (two) times daily. 180 tablet 2  . Multiple Vitamins-Minerals (CENTRUM SILVER PO) Take 1 tablet by mouth daily.      Marland Kitchen omeprazole (PRILOSEC) 20 MG capsule Take 1 capsule (20 mg total) by mouth daily. 90 capsule 3  . Polyethyl Glycol-Propyl Glycol (SYSTANE) 0.4-0.3 % SOLN Apply 1 drop to eye daily as needed (for dry eyes).    . potassium chloride (KLOR-CON) 10 MEQ tablet Take 1 tablet (10 mEq total) by mouth daily. 7 tablet 0  . QUEtiapine (SEROQUEL) 25 MG tablet Take 1 tablet (25 mg total) by mouth at bedtime. 90 tablet 0  . SURE COMFORT PEN NEEDLES 31G X 5 MM MISC Use to inject insulin 1 time per  day. 100 each 2   No facility-administered medications prior to visit.    PAST MEDICAL HISTORY: Past Medical History:  Diagnosis Date  . Allergy    Dust Mites  . Ankle pain    chronic  . Diabetes mellitus, type 2 ([place])   . Hyperlipidemia   . Hypertension   . Memory loss   . Osteoarthritis   . RENAL INSUFFICIENCY, CHRONIC 04/16/2010  . Rhinitis    allergic nos    PAST SURGICAL HISTORY: Past Surgical History:  Procedure Laterality Date  . CATARACT EXTRACTION    . CHOLECYSTECTOMY    . dental implants    . PARTIAL HYSTERECTOMY     in her 9s    FAMILY HISTORY: Family History  Problem Relation Age of Onset  . Healthy Brother   . Healthy Son   . Healthy Son   . Other Mother        unsure of history - "old age"  . Other Father        unsure  of history - "old age"  . Heart attack Neg Hx   . Colon cancer Neg Hx   . Breast cancer Neg Hx   . Stroke Neg Hx     SOCIAL HISTORY: Social History   Socioeconomic History  . Marital status: Married    Spouse name: Not on file  . Number of children: 2  . Years of education: some college  . Highest education level: Not on file  Occupational History  . Occupation:  retired  Tobacco Use  . Smoking status: Never Smoker  . Smokeless tobacco: Never Used  Vaping Use  . Vaping Use: Never used  Substance and Sexual Activity  . Alcohol use: No    Alcohol/week: 0.0 standard drinks  . Drug use: No  . Sexual activity: Yes    Partners: Male  Other Topics Concern  . Not on file  Social History Narrative   Husband is a Company secretary.   Right-handed.   1 cup caffeine daily.   Lives at home with husband.          Social Determinants of Health   Financial Resource Strain:   . Difficulty of Paying Living Expenses:   Food Insecurity:   . Worried About Charity fundraiser in the Last Year:   . Arboriculturist in the Last Year:   Transportation Needs:   . Film/video editor (Medical):   Marland Kitchen Lack of Transportation (Non-Medical):   Physical Activity:   . Days of Exercise per Week:   . Minutes of Exercise per Session:   Stress:   . Feeling of Stress :   Social Connections:   . Frequency of Communication with Friends and Family:   . Frequency of Social Gatherings with Friends and Family:   . Attends Religious Services:   . Active Member of Clubs or Organizations:   . Attends Archivist Meetings:   Marland Kitchen Marital Status:   Intimate Partner Violence:   . Fear of Current or Ex-Partner:   . Emotionally Abused:   Marland Kitchen Physically Abused:   . Sexually Abused:       PHYSICAL EXAM  There were no vitals filed for this visit. There is no height or weight on file to calculate BMI.  Generalized: Well developed, in no acute distress   Neurological examination  Mentation: Alert  oriented to time, place, history taking. Follows all commands speech and language fluent Cranial nerve II-XII: Pupils were equal  round reactive to light. Extraocular movements were full, visual field were full on confrontational test. Facial sensation and strength were normal. Uvula tongue midline. Head turning and shoulder shrug  were normal and symmetric. Motor: The motor testing reveals 5 over 5 strength of all 4 extremities. Good symmetric motor tone is noted throughout.  Sensory: Sensory testing is intact to soft touch on all 4 extremities. No evidence of extinction is noted.  Coordination: Cerebellar testing reveals good finger-nose-finger and heel-to-shin bilaterally.  Gait and station: Gait is normal. Tandem gait is normal. Romberg is negative. No drift is seen.  Reflexes: Deep tendon reflexes are symmetric and normal bilaterally.   DIAGNOSTIC DATA (LABS, IMAGING, TESTING) - I reviewed patient records, labs, notes, testing and imaging myself where available.  Lab Results  Component Value Date   WBC 7.7 09/13/2018   HGB 13.6 09/13/2018   HCT 41.7 09/13/2018   MCV 97.0 09/13/2018   PLT 214.0 09/13/2018      Component Value Date/Time   NA 141 02/14/2019 1101   NA 140 08/29/2017 0000   K 3.1 (L) 02/14/2019 1101   CL 104 02/14/2019 1101   CO2 27 02/14/2019 1101   GLUCOSE 277 (H) 02/14/2019 1101   BUN 20 02/14/2019 1101   BUN 19 08/29/2017 0000   CREATININE 1.64 (H) 02/14/2019 1101   CALCIUM 9.6 02/14/2019 1101   PROT 6.8 04/02/2008 1048   ALBUMIN 3.2 (L) 04/02/2008 1048   AST 18 08/29/2017 0000   ALT 16 08/29/2017 0000   ALKPHOS 92 08/29/2017 0000   BILITOT 0.6 04/02/2008 1048   GFRNONAA 35 (L) 04/04/2017 1507   GFRAA 40 (L) 04/04/2017 1507   Lab Results  Component Value Date   CHOL 124 03/24/2018   HDL 41.10 03/24/2018   LDLCALC 64 03/24/2018   TRIG 96.0 03/24/2018   CHOLHDL 3 03/24/2018   Lab Results  Component Value Date   HGBA1C 8.8 (A) 05/02/2019   Lab  Results  Component Value Date   VITAMINB12 1,460 (H) 09/21/2016   Lab Results  Component Value Date   TSH 1.69 10/12/2018      ASSESSMENT AND PLAN 79 y.o. year old female  has a past medical history of Allergy, Ankle pain, Diabetes mellitus, type 2 ([place]), Hyperlipidemia, Hypertension, Memory loss, Osteoarthritis, RENAL INSUFFICIENCY, CHRONIC (04/16/2010), and Rhinitis. here with ***   I spent 15 minutes with the patient. 50% of this time was spent   Butler Denmark, [place], DNP 08/07/2019, 5:45 AM [facility] [address] [place], [place] [address] [phone]

## 2019-08-09 ENCOUNTER — Encounter: Payer: Self-pay | Admitting: Adult Health

## 2019-08-09 ENCOUNTER — Ambulatory Visit (INDEPENDENT_AMBULATORY_CARE_PROVIDER_SITE_OTHER): Payer: Medicare Other | Admitting: Adult Health

## 2019-08-09 ENCOUNTER — Other Ambulatory Visit: Payer: Self-pay

## 2019-08-09 VITALS — BP 144/72 | HR 62 | Ht 64.0 in | Wt 224.0 lb

## 2019-08-09 DIAGNOSIS — F0391 Unspecified dementia with behavioral disturbance: Secondary | ICD-10-CM | POA: Diagnosis not present

## 2019-08-09 MED ORDER — DONEPEZIL HCL 10 MG PO TABS: 10.0000 mg | ORAL_TABLET | Freq: Every day | ORAL | 2 refills | Status: DC

## 2019-08-09 MED ORDER — QUETIAPINE FUMARATE 25 MG PO TABS: 25.0000 mg | ORAL_TABLET | Freq: Every day | ORAL | 2 refills | Status: DC

## 2019-08-09 MED ORDER — MEMANTINE HCL 10 MG PO TABS: 10.0000 mg | ORAL_TABLET | Freq: Two times a day (BID) | ORAL | 2 refills | Status: DC

## 2019-08-09 NOTE — Patient Instructions (Signed)
Your Plan:  Continue seroquel 25mg  nightly, aricept 10mg  nightly and namenda 10mg  twice daily    Follow up in 6 months or repeat memory evaluation      Thank you for coming to see [place] at [facility]. I hope we have been able to provide you high quality care today.  You may receive a patient satisfaction survey over the next few weeks. We would appreciate your feedback and comments so that we may continue to improve ourselves and the health of our patients.

## 2019-08-09 NOTE — Progress Notes (Signed)
[facility] [address] [place]. [place] [address] [phone]       HOSPITAL FOLLOW UP NOTE  Ms. Brihana Quickel Date of Birth:  1940-10-24 Medical Record Number:  [id number]   Reason for visit: Dementia    SUBJECTIVE:   CHIEF COMPLAINT:  Chief Complaint  Patient presents with  . Dementia    Stable.  Needs medication refills    HPI:   Angel Green is a 79 year old female with underlying medical history of diabetes, chronic renal insufficiency, HTN and dementia.  She was initially evaluated by Dr. Krista Blue on 10/18/2017 for dementia with behavioral disturbance.  Unable to review office notes as they are labeled "sensitive notes" in which I do not have access to.  She was previously seen by Dr. Krista Blue on 07/06/2018 where she continued on Seroquel, Namenda and Aricept.  Today, 08/09/2019, [name] returns accompanied by her husband for dementia follow-up.  Overall, reports memory has been relatively stable but will occasionally wax/wane. MMSE today 15/30 (prior 21/30 in 03/2019). She has continued on Seroquel 25 mg at bedtime, Namenda 10 mg twice daily and Aricept 10 mg nightly. No behavioral concerns - at times, visual hallucinations but not threatening or bothersome.  Sleeps well at night once she is able to fall asleep.  Typically will sleep until mid afternoon and occasionally will take a daytime nap.  Continues to follow with PCP routinely for chronic management.  No concerns at this time.      ROS:   14 system review of systems performed and negative with exception of memory loss  PMH:  Past Medical History:  Diagnosis Date  . Allergy    Dust Mites  . Ankle pain    chronic  . Diabetes mellitus, type 2 ([place])   . Hyperlipidemia   . Hypertension   . Memory loss   . Osteoarthritis   . RENAL INSUFFICIENCY, CHRONIC 04/16/2010  . Rhinitis    allergic nos    PSH:  Past Surgical History:  Procedure Laterality Date  . CATARACT EXTRACTION    .  CHOLECYSTECTOMY    . dental implants    . PARTIAL HYSTERECTOMY     in her 34s    Social History:  Social History   Socioeconomic History  . Marital status: Married    Spouse name: Not on file  . Number of children: 2  . Years of education: some college  . Highest education level: Not on file  Occupational History  . Occupation:  retired  Tobacco Use  . Smoking status: Never Smoker  . Smokeless tobacco: Never Used  Vaping Use  . Vaping Use: Never used  Substance and Sexual Activity  . Alcohol use: No    Alcohol/week: 0.0 standard drinks  . Drug use: No  . Sexual activity: Yes    Partners: Male  Other Topics Concern  . Not on file  Social History Narrative   Husband is a Company secretary.   Right-handed.   1 cup caffeine daily.   Lives at home with husband.          Social Determinants of Health   Financial Resource Strain:   . Difficulty of Paying Living Expenses:   Food Insecurity:   . Worried About Charity fundraiser in the Last Year:   . Arboriculturist in the Last Year:   Transportation Needs:   . Film/video editor (Medical):   Marland Kitchen Lack of Transportation (Non-Medical):   Physical Activity:   .  Days of Exercise per Week:   . Minutes of Exercise per Session:   Stress:   . Feeling of Stress :   Social Connections:   . Frequency of Communication with Friends and Family:   . Frequency of Social Gatherings with Friends and Family:   . Attends Religious Services:   . Active Member of Clubs or Organizations:   . Attends Archivist Meetings:   Marland Kitchen Marital Status:   Intimate Partner Violence:   . Fear of Current or Ex-Partner:   . Emotionally Abused:   Marland Kitchen Physically Abused:   . Sexually Abused:     Family History:  Family History  Problem Relation Age of Onset  . Healthy Brother   . Healthy Son   . Healthy Son   . Other Mother        unsure of history - "old age"  . Other Father        unsure of history - "old age"  . Heart attack Neg Hx   .  Colon cancer Neg Hx   . Breast cancer Neg Hx   . Stroke Neg Hx     Medications:   Current Outpatient Medications on File Prior to Visit  Medication Sig Dispense Refill  . amLODipine (NORVASC) 5 MG tablet Take 1 tablet (5 mg total) by mouth daily. 90 tablet 1  . aspirin 81 MG tablet Take 81 mg by mouth daily.      Marland Kitchen atorvastatin (LIPITOR) 10 MG tablet Take 1 tablet (10 mg total) by mouth daily. 90 tablet 0  . azelastine (ASTELIN) 0.1 % nasal spray Place 2 sprays into both nostrils at bedtime as needed for rhinitis. Use in each nostril as directed 90 mL 1  . Blood Glucose Monitoring Suppl (FREESTYLE LITE) DEVI Use to monitor your blood sugars 1 each 0  . cromolyn (NASALCROM) 5.2 MG/ACT nasal spray Place 1 spray into both nostrils 4 (four) times daily. 39 mL 1  . Cyanocobalamin (B-12 PO) Take 1 tablet by mouth daily.     Marland Kitchen donepezil (ARICEPT) 10 MG tablet Take 1 tablet (10 mg total) by mouth at bedtime. Please call [phone] to schedule follow up appt. 90 tablet 1  . Dulaglutide (TRULICITY) 4.5 QM/5.7QI SOPN Inject 4.5 mg into the skin once a week. 12 pen 3  . EPINEPHrine 0.3 mg/0.3 mL IJ SOAJ injection Inject 0.3 mg into the muscle once. Reported on 06/30/2015    . fluticasone (FLONASE) 50 MCG/ACT nasal spray USE 1 TO 2 SPRAYS IN EACH NOSTRIL DAILY 16 g 3  . fluticasone (FLOVENT HFA) 44 MCG/ACT inhaler Inhale 2 puffs into the lungs 2 (two) times daily. 3 Inhaler 3  . furosemide (LASIX) 20 MG tablet Take 2 tablets (40 mg total) by mouth daily. 180 tablet 1  . glucose blood (FREESTYLE LITE) test strip Use to monitor your blood sugars 4 times per day; E11.9 400 each 12  . Insulin Lispro Prot & Lispro (HUMALOG MIX 75/25 KWIKPEN) (75-25) 100 UNIT/ML Kwikpen Inject 30 Units into the skin daily with breakfast. And pen needles 1/day 15 pen 3  . Lancets (FREESTYLE) lancets Use to monitor blood sugars 4 times per day, once before each meal and once at bedtime 400 each 12  . loratadine (CLARITIN) 10 MG  tablet TAKE 1 TABLET DAILY 90 tablet 3  . losartan (COZAAR) 50 MG tablet Take 1 tablet (50 mg total) by mouth daily. 90 tablet 1  . memantine (NAMENDA) 10 MG tablet Take 1  tablet (10 mg total) by mouth 2 (two) times daily. 180 tablet 2  . Multiple Vitamins-Minerals (CENTRUM SILVER PO) Take 1 tablet by mouth daily.      Marland Kitchen omeprazole (PRILOSEC) 20 MG capsule Take 1 capsule (20 mg total) by mouth daily. 90 capsule 3  . Polyethyl Glycol-Propyl Glycol (SYSTANE) 0.4-0.3 % SOLN Apply 1 drop to eye daily as needed (for dry eyes).    . potassium chloride (KLOR-CON) 10 MEQ tablet Take 1 tablet (10 mEq total) by mouth daily. 7 tablet 0  . QUEtiapine (SEROQUEL) 25 MG tablet Take 1 tablet (25 mg total) by mouth at bedtime. 90 tablet 0  . SURE COMFORT PEN NEEDLES 31G X 5 MM MISC Use to inject insulin 1 time per day. 100 each 2   No current facility-administered medications on file prior to visit.    Allergies:   Allergies  Allergen Reactions  . Dust Mite Mixed Allergen Ext [Mite (D. Farinae)]       OBJECTIVE:  Physical Exam  Vitals:   08/09/19 1445  BP: 122/73  Pulse: 92  Weight: 224 lb (101.6 kg)  Height: 5\' 4"  (1.626 m)   Body mass index is 38.45 kg/m. No exam data present  General: well developed, well nourished, very pleasant elderly African-American female, seated, in no evident distress Head: head normocephalic and atraumatic.   Neck: supple with no carotid or supraclavicular bruits Cardiovascular: regular rate and rhythm, no murmurs Musculoskeletal: no deformity Skin:  no rash/petichiae Vascular:  Normal pulses all extremities   Neurologic Exam Mental Status: Awake and fully alert.   Husband provides majority of today's history.  Mood and affect appropriate.  MMSE - Mini Mental State Exam 08/09/2019 11/14/2018 04/20/2018  Not completed: - Unable to complete -  Orientation to time 1 - 3  Orientation to Place 3 - 4  Registration 3 - 3  Attention/ Calculation 0 - 2  Recall 0 -  0  Language- name 2 objects 2 - 2  Language- repeat 1 - 1  Language- follow 3 step command 3 - 3  Language- read & follow direction 1 - 1  Write a sentence 1 - 1  Copy design 0 - 1  Copy design-comments 4 animals - -  Total score 15 - 21   Cranial Nerves: Pupils equal, briskly reactive to light. Extraocular movements full without nystagmus. Visual fields full to confrontation. Hearing intact. Facial sensation intact. Face, tongue, palate moves normally and symmetrically.  Motor: Normal bulk and tone. Normal strength in all tested extremity muscles. Sensory.: intact to touch , pinprick , position and vibratory sensation.  Coordination: Rapid alternating movements normal in all extremities. Finger-to-nose and heel-to-shin performed accurately bilaterally. Gait and Station: Arises from chair without difficulty. Stance is normal. Gait demonstrates normal stride length and balance Reflexes: 1+ and symmetric. Toes downgoing.       ASSESSMENT/PLAN: Mikailah Morel is a 79 y.o. year old female with underlying medical history of DM, HTN, chronic renal dysfunction and dementia.  Initially evaluated by Dr Krista Blue in 2019 with prior visit 06/2018.  Returns today for yearly follow-up visit and medication refills.  Husband reports dementia has been relatively stable without behavioral concerns.  Continue current regimen with Seroquel 25 mg nightly, Aricept 10 mg nightly and Namenda 10 mg twice daily.  Refills provided.  6 point decline per MMSE evaluation today compared to prior evaluation.  Will repeat MMSE at follow-up visit and if further decline, recommend further discussion in regards to ongoing use  of Aricept and Namenda as likely not providing additional management and to reduce polypharmacy.  Recommend encouraging daytime activity and possible use of melatonin in the evening for further assistance of occasional insomnia.  Continue to follow with PCP for routine chronic disease management.   Follow up  in 6 months or call earlier if needed   I spent 35 minutes of face-to-face and non-face-to-face time with patient.  This included previsit chart review, lab review, study review, order entry, electronic health record documentation, patient education      Frann Rider, [facility]  [facility] [address] [place], [place] [address]  Phone [phone] Fax [phone] Note: This document was prepared with digital dictation and possible smart phrase technology. Any transcriptional errors that result from this process are unintentional.

## 2019-08-16 ENCOUNTER — Other Ambulatory Visit: Payer: Self-pay

## 2019-08-16 ENCOUNTER — Ambulatory Visit (INDEPENDENT_AMBULATORY_CARE_PROVIDER_SITE_OTHER): Payer: Medicare Other | Admitting: Internal Medicine

## 2019-08-16 ENCOUNTER — Encounter: Payer: Self-pay | Admitting: Internal Medicine

## 2019-08-16 VITALS — BP 134/83 | HR 98 | Temp 95.6°F | Resp 18 | Ht 64.0 in | Wt 224.4 lb

## 2019-08-16 DIAGNOSIS — R5381 Other malaise: Secondary | ICD-10-CM | POA: Diagnosis not present

## 2019-08-16 DIAGNOSIS — I10 Essential (primary) hypertension: Secondary | ICD-10-CM | POA: Diagnosis not present

## 2019-08-16 DIAGNOSIS — Z9181 History of falling: Secondary | ICD-10-CM

## 2019-08-16 DIAGNOSIS — F0390 Unspecified dementia without behavioral disturbance: Secondary | ICD-10-CM | POA: Diagnosis not present

## 2019-08-16 DIAGNOSIS — Z09 Encounter for follow-up examination after completed treatment for conditions other than malignant neoplasm: Secondary | ICD-10-CM

## 2019-08-16 DIAGNOSIS — R5383 Other fatigue: Secondary | ICD-10-CM

## 2019-08-16 LAB — CBC WITH DIFFERENTIAL/PLATELET
Basophils Absolute: 0 10*3/uL (ref 0.0–0.1)
Basophils Relative: 0.5 % (ref 0.0–3.0)
Eosinophils Absolute: 0.2 10*3/uL (ref 0.0–0.7)
Eosinophils Relative: 3.6 % (ref 0.0–5.0)
HCT: 42.2 % (ref 36.0–46.0)
Hemoglobin: 13.9 g/dL (ref 12.0–15.0)
Lymphocytes Relative: 36.1 % (ref 12.0–46.0)
Lymphs Abs: 2.4 10*3/uL (ref 0.7–4.0)
MCHC: 33 g/dL (ref 30.0–36.0)
MCV: 99.2 fl (ref 78.0–100.0)
Monocytes Absolute: 0.5 10*3/uL (ref 0.1–1.0)
Monocytes Relative: 7 % (ref 3.0–12.0)
Neutro Abs: 3.5 10*3/uL (ref 1.4–7.7)
Neutrophils Relative %: 52.8 % (ref 43.0–77.0)
Platelets: 178 10*3/uL (ref 150.0–400.0)
RBC: 4.25 Mil/uL (ref 3.87–5.11)
RDW: 14.9 % (ref 11.5–15.5)
WBC: 6.6 10*3/uL (ref 4.0–10.5)

## 2019-08-16 LAB — COMPREHENSIVE METABOLIC PANEL
ALT: 21 U/L (ref 0–35)
AST: 19 U/L (ref 0–37)
Albumin: 3.8 g/dL (ref 3.5–5.2)
Alkaline Phosphatase: 74 U/L (ref 39–117)
BUN: 13 mg/dL (ref 6–23)
CO2: 31 mEq/L (ref 19–32)
Calcium: 9.5 mg/dL (ref 8.4–10.5)
Chloride: 99 mEq/L (ref 96–112)
Creatinine, Ser: 1.44 mg/dL — ABNORMAL HIGH (ref 0.40–1.20)
GFR: 42.23 mL/min — ABNORMAL LOW (ref >60.00)
Glucose, Bld: 223 mg/dL — ABNORMAL HIGH (ref 70–99)
Potassium: 3.8 mEq/L (ref 3.5–5.1)
Sodium: 141 mEq/L (ref 135–145)
Total Bilirubin: 0.5 mg/dL (ref 0.2–1.2)
Total Protein: 6.9 g/dL (ref 6.0–8.3)

## 2019-08-16 LAB — TSH: TSH: 1.18 u[IU]/mL (ref 0.35–4.50)

## 2019-08-16 NOTE — Progress Notes (Signed)
Pre visit review using our clinic review tool, if applicable. No additional management support is needed unless otherwise documented below in the visit note. 

## 2019-08-16 NOTE — Patient Instructions (Addendum)
Check the  blood pressure weekly BP GOAL is between 110/65 and  135/85. If it is consistently higher or lower, let me know    Please consider doing physical therapy and use of a walker.  GO TO THE LAB : Get the blood work     [place], [place] back for   a checkup in 4 months

## 2019-08-16 NOTE — Progress Notes (Addendum)
Subjective:    Patient ID: Angel Green, female    DOB: 16-Mar-1940, 79 y.o.   MRN: [id number]  DOS:  08/16/2019 Type of visit - description: Follow-up, here with her husband Note from neurology reviewed The husband's main concern is fatigue, the patient does not like to do much just sitting around. See review of systems    Review of Systems No fever chills No chest pain, no difficulty breathing No cough or sputum production. Depression?  Hard to say, patient has dementia but when asked about it she said no  Past Medical History:  Diagnosis Date  . Allergy    Dust Mites  . Ankle pain    chronic  . Diabetes mellitus, type 2 ([place])   . Hyperlipidemia   . Hypertension   . Memory loss   . Osteoarthritis   . RENAL INSUFFICIENCY, CHRONIC 04/16/2010  . Rhinitis    allergic nos    Past Surgical History:  Procedure Laterality Date  . CATARACT EXTRACTION    . CHOLECYSTECTOMY    . dental implants    . PARTIAL HYSTERECTOMY     in her 30s    Allergies as of 08/16/2019      Reactions   Dust Mite Mixed Allergen Ext [mite (d. Farinae)]       Medication List       Accurate as of August 16, 2019 11:59 PM. If you have any questions, ask your nurse or doctor.        STOP taking these medications   azelastine 0.1 % nasal spray Commonly known as: ASTELIN Stopped by: Kathlene November, MD   cromolyn 5.2 MG/ACT nasal spray Commonly known as: NasalCrom Stopped by: Kathlene November, MD   fluticasone 50 MCG/ACT nasal spray Commonly known as: FLONASE Stopped by: Kathlene November, MD     TAKE these medications   amLODipine 5 MG tablet Commonly known as: NORVASC Take 1 tablet (5 mg total) by mouth daily.   aspirin 81 MG tablet Take 81 mg by mouth daily.   atorvastatin 10 MG tablet Commonly known as: LIPITOR Take 1 tablet (10 mg total) by mouth daily.   B-12 PO Take 1 tablet by mouth daily.   CENTRUM SILVER PO Take 1 tablet by mouth daily.   donepezil 10 MG tablet Commonly known as:  ARICEPT Take 1 tablet (10 mg total) by mouth at bedtime.   EPINEPHrine 0.3 mg/0.3 mL Soaj injection Commonly known as: EPI-PEN Inject 0.3 mg into the muscle once. Reported on 06/30/2015   fluticasone 44 MCG/ACT inhaler Commonly known as: Flovent HFA Inhale 2 puffs into the lungs 2 (two) times daily.   freestyle lancets Use to monitor blood sugars 4 times per day, once before each meal and once at bedtime   FreeStyle Lite Devi Use to monitor your blood sugars   FREESTYLE LITE test strip Generic drug: glucose blood Use to monitor your blood sugars 4 times per day; E11.9   furosemide 20 MG tablet Commonly known as: LASIX Take 2 tablets (40 mg total) by mouth daily.   Insulin Lispro Prot & Lispro (75-25) 100 UNIT/ML Kwikpen Commonly known as: HumaLOG Mix 75/25 KwikPen Inject 30 Units into the skin daily with breakfast. And pen needles 1/day   loratadine 10 MG tablet Commonly known as: CLARITIN TAKE 1 TABLET DAILY   losartan 50 MG tablet Commonly known as: COZAAR Take 1 tablet (50 mg total) by mouth daily.   memantine 10 MG tablet Commonly known as: NAMENDA Take 1  tablet (10 mg total) by mouth 2 (two) times daily.   omeprazole 20 MG capsule Commonly known as: PRILOSEC Take 1 capsule (20 mg total) by mouth daily.   potassium chloride 10 MEQ tablet Commonly known as: KLOR-CON Take 1 tablet (10 mEq total) by mouth daily.   QUEtiapine 25 MG tablet Commonly known as: SEROquel Take 1 tablet (25 mg total) by mouth at bedtime.   Sure Comfort Pen Needles 31G X 5 MM Misc Generic drug: Insulin Pen Needle Use to inject insulin 1 time per day.   Systane 0.4-0.3 % Soln Generic drug: Polyethyl Glycol-Propyl Glycol Apply 1 drop to eye daily as needed (for dry eyes).   Trulicity 4.5 ON/6.2XB Sopn Generic drug: Dulaglutide Inject 4.5 mg into the skin once a week.          Objective:   Physical Exam BP 134/83 (BP Location: Left Arm, Patient Position: Sitting, Cuff Size:  Normal)   Pulse 98   Temp (!) 95.6 F (35.3 C) (Temporal)   Resp 18   Ht 5\' 4"  (1.626 m)   Wt 224 lb 6 oz (101.8 kg)   SpO2 91%   BMI 38.51 kg/m  General:   Well developed, NAD, BMI noted. HEENT:  Normocephalic . Face symmetric, atraumatic Lungs:  CTA B Normal respiratory effort, no intercostal retractions, no accessory muscle use. Heart: RRR,  no murmur.  Lower extremities: no pretibial edema bilaterally  Skin: Not pale. Not jaundice Neurologic:  alert, demented, no formal exam done by MMSE @neurology  few days ago was 15.  Speech normal, gait somewhat limited Psych--  Cooperative with normal attention span and concentration.      Assessment     Assessment   DM  Dr Loanne Drilling + Neuropathy: Per foot exam 12-2014 HTN ---change ACE to ARB is 03/2014 Hyperlipidemia CRI  dx 2012  Sees Dr Arty Baumgartner  Morbid obesity DJD, had  chronic ankle pain Allergies -- dust mites , occ uses a inhaler , sees allergist, has shots q weeks started ~ 08-2014 Dementia:  MMSE 7/218: 24, rx Aricept.  B12, RPR, sed rate and TSH normal MMSE 07/2017 :13 worse MRI brain 6/19:  prominent right temporal lobe volume loss.  PLAN DM: Per Dr. Loanne Drilling, last A1c 8.0 HTN: Continue amlodipine, Lasix, losartan, potassium.  No low BPs in the ambulatory setting, continue checking.  Check a CMP, CBC, TSH. Dementia: Last visit with neurology 08/09/2019, MMSE: 15, worsening.. Was on  Seroquel, Aricept, Namenda they are considering decrease Namenda due to polypharmacy and possibly lack of effectiveness.  Dementia is limiting her physical activity, she will benefit from physical therapy.  We will send a referral. Fatigue:  multifactorial including BMI, inactivity, dementia, deconditioning.  Hard to assess for depression.  We are checking labs today Allergies: Not taking any nasal sprays. RTC 4 months    This visit occurred during the SARS-CoV-2 public health emergency.  Safety protocols were in place, including  screening questions prior to the visit, additional usage of staff PPE, and extensive cleaning of exam room while observing appropriate contact time as indicated for disinfecting solutions.

## 2019-08-18 DIAGNOSIS — M25571 Pain in right ankle and joints of right foot: Secondary | ICD-10-CM | POA: Diagnosis not present

## 2019-08-18 DIAGNOSIS — I129 Hypertensive chronic kidney disease with stage 1 through stage 4 chronic kidney disease, or unspecified chronic kidney disease: Secondary | ICD-10-CM | POA: Diagnosis not present

## 2019-08-18 DIAGNOSIS — N189 Chronic kidney disease, unspecified: Secondary | ICD-10-CM | POA: Diagnosis not present

## 2019-08-18 DIAGNOSIS — Z7982 Long term (current) use of aspirin: Secondary | ICD-10-CM | POA: Diagnosis not present

## 2019-08-18 DIAGNOSIS — E1140 Type 2 diabetes mellitus with diabetic neuropathy, unspecified: Secondary | ICD-10-CM | POA: Diagnosis not present

## 2019-08-18 DIAGNOSIS — Z9181 History of falling: Secondary | ICD-10-CM | POA: Diagnosis not present

## 2019-08-18 DIAGNOSIS — J309 Allergic rhinitis, unspecified: Secondary | ICD-10-CM | POA: Diagnosis not present

## 2019-08-18 DIAGNOSIS — K219 Gastro-esophageal reflux disease without esophagitis: Secondary | ICD-10-CM | POA: Diagnosis not present

## 2019-08-18 DIAGNOSIS — F0391 Unspecified dementia with behavioral disturbance: Secondary | ICD-10-CM | POA: Diagnosis not present

## 2019-08-18 DIAGNOSIS — M150 Primary generalized (osteo)arthritis: Secondary | ICD-10-CM | POA: Diagnosis not present

## 2019-08-18 DIAGNOSIS — E1122 Type 2 diabetes mellitus with diabetic chronic kidney disease: Secondary | ICD-10-CM | POA: Diagnosis not present

## 2019-08-18 DIAGNOSIS — E785 Hyperlipidemia, unspecified: Secondary | ICD-10-CM | POA: Diagnosis not present

## 2019-08-18 DIAGNOSIS — Z79899 Other long term (current) drug therapy: Secondary | ICD-10-CM | POA: Diagnosis not present

## 2019-08-18 DIAGNOSIS — Z794 Long term (current) use of insulin: Secondary | ICD-10-CM | POA: Diagnosis not present

## 2019-08-18 DIAGNOSIS — Z6838 Body mass index (BMI) 38.0-38.9, adult: Secondary | ICD-10-CM | POA: Diagnosis not present

## 2019-08-18 NOTE — Assessment & Plan Note (Addendum)
DM: Per Dr. Loanne Drilling, last A1c 8.0 HTN: Continue amlodipine, Lasix, losartan, potassium.  No low BPs in the ambulatory setting, continue checking.  Check a CMP, CBC, TSH. Dementia: Last visit with neurology 08/09/2019, MMSE: 15, worsening.. Was on  Seroquel, Aricept, Namenda they are considering decrease Namenda due to polypharmacy and possibly lack of effectiveness.  Dementia is limiting her physical activity, she will benefit from physical therapy.  We will send a referral. Fatigue:  multifactorial including BMI, inactivity, dementia, deconditioning.  Hard to assess for depression.  We are checking labs today Allergies: Not taking any nasal sprays. RTC 4 months

## 2019-08-20 ENCOUNTER — Telehealth: Payer: Self-pay | Admitting: Internal Medicine

## 2019-08-20 NOTE — Telephone Encounter (Signed)
Spoke w/ Mariella Saa- verbal orders given.

## 2019-08-20 NOTE — Telephone Encounter (Signed)
Verbal Order   Call : [facility]  Call Back # [phone]  Requesting a verbal order for physical therapy: Once a week for 3 weeks 2 a week for 3weeks 1 a week for 4weeks   Also requesting a vebal order for social work evaluation for spouse to mange wife in daily living.

## 2019-08-21 DIAGNOSIS — E1122 Type 2 diabetes mellitus with diabetic chronic kidney disease: Secondary | ICD-10-CM | POA: Diagnosis not present

## 2019-08-21 DIAGNOSIS — M150 Primary generalized (osteo)arthritis: Secondary | ICD-10-CM | POA: Diagnosis not present

## 2019-08-21 DIAGNOSIS — E1140 Type 2 diabetes mellitus with diabetic neuropathy, unspecified: Secondary | ICD-10-CM | POA: Diagnosis not present

## 2019-08-21 DIAGNOSIS — I129 Hypertensive chronic kidney disease with stage 1 through stage 4 chronic kidney disease, or unspecified chronic kidney disease: Secondary | ICD-10-CM | POA: Diagnosis not present

## 2019-08-21 DIAGNOSIS — F0391 Unspecified dementia with behavioral disturbance: Secondary | ICD-10-CM | POA: Diagnosis not present

## 2019-08-21 DIAGNOSIS — N189 Chronic kidney disease, unspecified: Secondary | ICD-10-CM | POA: Diagnosis not present

## 2019-08-22 DIAGNOSIS — E1140 Type 2 diabetes mellitus with diabetic neuropathy, unspecified: Secondary | ICD-10-CM | POA: Diagnosis not present

## 2019-08-22 DIAGNOSIS — N189 Chronic kidney disease, unspecified: Secondary | ICD-10-CM | POA: Diagnosis not present

## 2019-08-22 DIAGNOSIS — F0391 Unspecified dementia with behavioral disturbance: Secondary | ICD-10-CM | POA: Diagnosis not present

## 2019-08-22 DIAGNOSIS — I129 Hypertensive chronic kidney disease with stage 1 through stage 4 chronic kidney disease, or unspecified chronic kidney disease: Secondary | ICD-10-CM | POA: Diagnosis not present

## 2019-08-22 DIAGNOSIS — E1122 Type 2 diabetes mellitus with diabetic chronic kidney disease: Secondary | ICD-10-CM | POA: Diagnosis not present

## 2019-08-22 DIAGNOSIS — M150 Primary generalized (osteo)arthritis: Secondary | ICD-10-CM | POA: Diagnosis not present

## 2019-08-29 DIAGNOSIS — I129 Hypertensive chronic kidney disease with stage 1 through stage 4 chronic kidney disease, or unspecified chronic kidney disease: Secondary | ICD-10-CM | POA: Diagnosis not present

## 2019-08-29 DIAGNOSIS — N189 Chronic kidney disease, unspecified: Secondary | ICD-10-CM | POA: Diagnosis not present

## 2019-08-29 DIAGNOSIS — M150 Primary generalized (osteo)arthritis: Secondary | ICD-10-CM | POA: Diagnosis not present

## 2019-08-29 DIAGNOSIS — F0391 Unspecified dementia with behavioral disturbance: Secondary | ICD-10-CM | POA: Diagnosis not present

## 2019-08-29 DIAGNOSIS — E1122 Type 2 diabetes mellitus with diabetic chronic kidney disease: Secondary | ICD-10-CM | POA: Diagnosis not present

## 2019-08-29 DIAGNOSIS — E1140 Type 2 diabetes mellitus with diabetic neuropathy, unspecified: Secondary | ICD-10-CM | POA: Diagnosis not present

## 2019-09-03 ENCOUNTER — Telehealth: Payer: Self-pay | Admitting: Internal Medicine

## 2019-09-03 MED ORDER — LOSARTAN POTASSIUM 50 MG PO TABS: 50.0000 mg | ORAL_TABLET | Freq: Every day | ORAL | 1 refills | Status: DC

## 2019-09-03 NOTE — Telephone Encounter (Signed)
Rx sent 

## 2019-09-03 NOTE — Telephone Encounter (Signed)
Medication:  losartan (COZAAR) 50 MG tablet   Has the patient contacted their pharmacy? No. (If no, request that the patient contact the pharmacy for the refill.) (If yes, when and what did the pharmacy advise?)  Preferred Pharmacy (with phone number or street name): [facility] [place]  [address] [place] [address]  Phone:  [phone] Fax:  [phone]  DEA #:  [phone]  Agent: Please be advised that RX refills may take up to 3 business days. We ask that you follow-up with your pharmacy.

## 2019-09-06 DIAGNOSIS — E1140 Type 2 diabetes mellitus with diabetic neuropathy, unspecified: Secondary | ICD-10-CM | POA: Diagnosis not present

## 2019-09-06 DIAGNOSIS — I129 Hypertensive chronic kidney disease with stage 1 through stage 4 chronic kidney disease, or unspecified chronic kidney disease: Secondary | ICD-10-CM | POA: Diagnosis not present

## 2019-09-06 DIAGNOSIS — N189 Chronic kidney disease, unspecified: Secondary | ICD-10-CM | POA: Diagnosis not present

## 2019-09-06 DIAGNOSIS — E1122 Type 2 diabetes mellitus with diabetic chronic kidney disease: Secondary | ICD-10-CM | POA: Diagnosis not present

## 2019-09-06 DIAGNOSIS — F0391 Unspecified dementia with behavioral disturbance: Secondary | ICD-10-CM | POA: Diagnosis not present

## 2019-09-06 DIAGNOSIS — M150 Primary generalized (osteo)arthritis: Secondary | ICD-10-CM | POA: Diagnosis not present

## 2019-09-11 DIAGNOSIS — F0391 Unspecified dementia with behavioral disturbance: Secondary | ICD-10-CM | POA: Diagnosis not present

## 2019-09-11 DIAGNOSIS — I129 Hypertensive chronic kidney disease with stage 1 through stage 4 chronic kidney disease, or unspecified chronic kidney disease: Secondary | ICD-10-CM | POA: Diagnosis not present

## 2019-09-11 DIAGNOSIS — E1140 Type 2 diabetes mellitus with diabetic neuropathy, unspecified: Secondary | ICD-10-CM | POA: Diagnosis not present

## 2019-09-11 DIAGNOSIS — N189 Chronic kidney disease, unspecified: Secondary | ICD-10-CM | POA: Diagnosis not present

## 2019-09-11 DIAGNOSIS — E1122 Type 2 diabetes mellitus with diabetic chronic kidney disease: Secondary | ICD-10-CM | POA: Diagnosis not present

## 2019-09-11 DIAGNOSIS — M150 Primary generalized (osteo)arthritis: Secondary | ICD-10-CM | POA: Diagnosis not present

## 2019-09-12 DIAGNOSIS — I129 Hypertensive chronic kidney disease with stage 1 through stage 4 chronic kidney disease, or unspecified chronic kidney disease: Secondary | ICD-10-CM | POA: Diagnosis not present

## 2019-09-12 DIAGNOSIS — E785 Hyperlipidemia, unspecified: Secondary | ICD-10-CM

## 2019-09-12 DIAGNOSIS — J309 Allergic rhinitis, unspecified: Secondary | ICD-10-CM

## 2019-09-12 DIAGNOSIS — Z6838 Body mass index (BMI) 38.0-38.9, adult: Secondary | ICD-10-CM

## 2019-09-12 DIAGNOSIS — M150 Primary generalized (osteo)arthritis: Secondary | ICD-10-CM

## 2019-09-12 DIAGNOSIS — K219 Gastro-esophageal reflux disease without esophagitis: Secondary | ICD-10-CM

## 2019-09-12 DIAGNOSIS — E1140 Type 2 diabetes mellitus with diabetic neuropathy, unspecified: Secondary | ICD-10-CM

## 2019-09-12 DIAGNOSIS — M25571 Pain in right ankle and joints of right foot: Secondary | ICD-10-CM

## 2019-09-12 DIAGNOSIS — Z7982 Long term (current) use of aspirin: Secondary | ICD-10-CM

## 2019-09-12 DIAGNOSIS — Z794 Long term (current) use of insulin: Secondary | ICD-10-CM

## 2019-09-12 DIAGNOSIS — Z9181 History of falling: Secondary | ICD-10-CM

## 2019-09-12 DIAGNOSIS — Z79899 Other long term (current) drug therapy: Secondary | ICD-10-CM

## 2019-09-12 DIAGNOSIS — F0391 Unspecified dementia with behavioral disturbance: Secondary | ICD-10-CM | POA: Diagnosis not present

## 2019-09-12 DIAGNOSIS — E1122 Type 2 diabetes mellitus with diabetic chronic kidney disease: Secondary | ICD-10-CM | POA: Diagnosis not present

## 2019-09-12 DIAGNOSIS — N189 Chronic kidney disease, unspecified: Secondary | ICD-10-CM | POA: Diagnosis not present

## 2019-09-13 ENCOUNTER — Telehealth: Payer: Self-pay

## 2019-09-13 NOTE — Telephone Encounter (Signed)
Plan of care received from [place], form signed and faxed back to [phone]. Plan of care sent for scanning.

## 2019-09-14 DIAGNOSIS — E1140 Type 2 diabetes mellitus with diabetic neuropathy, unspecified: Secondary | ICD-10-CM | POA: Diagnosis not present

## 2019-09-14 DIAGNOSIS — M150 Primary generalized (osteo)arthritis: Secondary | ICD-10-CM | POA: Diagnosis not present

## 2019-09-14 DIAGNOSIS — F0391 Unspecified dementia with behavioral disturbance: Secondary | ICD-10-CM | POA: Diagnosis not present

## 2019-09-14 DIAGNOSIS — I129 Hypertensive chronic kidney disease with stage 1 through stage 4 chronic kidney disease, or unspecified chronic kidney disease: Secondary | ICD-10-CM | POA: Diagnosis not present

## 2019-09-14 DIAGNOSIS — N189 Chronic kidney disease, unspecified: Secondary | ICD-10-CM | POA: Diagnosis not present

## 2019-09-14 DIAGNOSIS — E1122 Type 2 diabetes mellitus with diabetic chronic kidney disease: Secondary | ICD-10-CM | POA: Diagnosis not present

## 2019-09-17 DIAGNOSIS — E1122 Type 2 diabetes mellitus with diabetic chronic kidney disease: Secondary | ICD-10-CM | POA: Diagnosis not present

## 2019-09-17 DIAGNOSIS — J309 Allergic rhinitis, unspecified: Secondary | ICD-10-CM | POA: Diagnosis not present

## 2019-09-17 DIAGNOSIS — M25571 Pain in right ankle and joints of right foot: Secondary | ICD-10-CM | POA: Diagnosis not present

## 2019-09-17 DIAGNOSIS — M150 Primary generalized (osteo)arthritis: Secondary | ICD-10-CM | POA: Diagnosis not present

## 2019-09-17 DIAGNOSIS — E1140 Type 2 diabetes mellitus with diabetic neuropathy, unspecified: Secondary | ICD-10-CM | POA: Diagnosis not present

## 2019-09-17 DIAGNOSIS — Z7982 Long term (current) use of aspirin: Secondary | ICD-10-CM | POA: Diagnosis not present

## 2019-09-17 DIAGNOSIS — K219 Gastro-esophageal reflux disease without esophagitis: Secondary | ICD-10-CM | POA: Diagnosis not present

## 2019-09-17 DIAGNOSIS — Z9181 History of falling: Secondary | ICD-10-CM | POA: Diagnosis not present

## 2019-09-17 DIAGNOSIS — E785 Hyperlipidemia, unspecified: Secondary | ICD-10-CM | POA: Diagnosis not present

## 2019-09-17 DIAGNOSIS — Z6838 Body mass index (BMI) 38.0-38.9, adult: Secondary | ICD-10-CM | POA: Diagnosis not present

## 2019-09-17 DIAGNOSIS — Z79899 Other long term (current) drug therapy: Secondary | ICD-10-CM | POA: Diagnosis not present

## 2019-09-17 DIAGNOSIS — N189 Chronic kidney disease, unspecified: Secondary | ICD-10-CM | POA: Diagnosis not present

## 2019-09-17 DIAGNOSIS — I129 Hypertensive chronic kidney disease with stage 1 through stage 4 chronic kidney disease, or unspecified chronic kidney disease: Secondary | ICD-10-CM | POA: Diagnosis not present

## 2019-09-17 DIAGNOSIS — Z794 Long term (current) use of insulin: Secondary | ICD-10-CM | POA: Diagnosis not present

## 2019-09-17 DIAGNOSIS — F0391 Unspecified dementia with behavioral disturbance: Secondary | ICD-10-CM | POA: Diagnosis not present

## 2019-09-18 DIAGNOSIS — E1122 Type 2 diabetes mellitus with diabetic chronic kidney disease: Secondary | ICD-10-CM | POA: Diagnosis not present

## 2019-09-18 DIAGNOSIS — N189 Chronic kidney disease, unspecified: Secondary | ICD-10-CM | POA: Diagnosis not present

## 2019-09-18 DIAGNOSIS — F0391 Unspecified dementia with behavioral disturbance: Secondary | ICD-10-CM | POA: Diagnosis not present

## 2019-09-18 DIAGNOSIS — M150 Primary generalized (osteo)arthritis: Secondary | ICD-10-CM | POA: Diagnosis not present

## 2019-09-18 DIAGNOSIS — E1140 Type 2 diabetes mellitus with diabetic neuropathy, unspecified: Secondary | ICD-10-CM | POA: Diagnosis not present

## 2019-09-18 DIAGNOSIS — I129 Hypertensive chronic kidney disease with stage 1 through stage 4 chronic kidney disease, or unspecified chronic kidney disease: Secondary | ICD-10-CM | POA: Diagnosis not present

## 2019-09-21 DIAGNOSIS — E1140 Type 2 diabetes mellitus with diabetic neuropathy, unspecified: Secondary | ICD-10-CM | POA: Diagnosis not present

## 2019-09-21 DIAGNOSIS — M150 Primary generalized (osteo)arthritis: Secondary | ICD-10-CM | POA: Diagnosis not present

## 2019-09-21 DIAGNOSIS — N189 Chronic kidney disease, unspecified: Secondary | ICD-10-CM | POA: Diagnosis not present

## 2019-09-21 DIAGNOSIS — E1122 Type 2 diabetes mellitus with diabetic chronic kidney disease: Secondary | ICD-10-CM | POA: Diagnosis not present

## 2019-09-21 DIAGNOSIS — F0391 Unspecified dementia with behavioral disturbance: Secondary | ICD-10-CM | POA: Diagnosis not present

## 2019-09-21 DIAGNOSIS — I129 Hypertensive chronic kidney disease with stage 1 through stage 4 chronic kidney disease, or unspecified chronic kidney disease: Secondary | ICD-10-CM | POA: Diagnosis not present

## 2019-09-28 DIAGNOSIS — E1140 Type 2 diabetes mellitus with diabetic neuropathy, unspecified: Secondary | ICD-10-CM | POA: Diagnosis not present

## 2019-09-28 DIAGNOSIS — M150 Primary generalized (osteo)arthritis: Secondary | ICD-10-CM | POA: Diagnosis not present

## 2019-09-28 DIAGNOSIS — E1122 Type 2 diabetes mellitus with diabetic chronic kidney disease: Secondary | ICD-10-CM | POA: Diagnosis not present

## 2019-09-28 DIAGNOSIS — F0391 Unspecified dementia with behavioral disturbance: Secondary | ICD-10-CM | POA: Diagnosis not present

## 2019-09-28 DIAGNOSIS — I129 Hypertensive chronic kidney disease with stage 1 through stage 4 chronic kidney disease, or unspecified chronic kidney disease: Secondary | ICD-10-CM | POA: Diagnosis not present

## 2019-09-28 DIAGNOSIS — N189 Chronic kidney disease, unspecified: Secondary | ICD-10-CM | POA: Diagnosis not present

## 2019-10-03 DIAGNOSIS — E1140 Type 2 diabetes mellitus with diabetic neuropathy, unspecified: Secondary | ICD-10-CM | POA: Diagnosis not present

## 2019-10-03 DIAGNOSIS — F0391 Unspecified dementia with behavioral disturbance: Secondary | ICD-10-CM | POA: Diagnosis not present

## 2019-10-03 DIAGNOSIS — I129 Hypertensive chronic kidney disease with stage 1 through stage 4 chronic kidney disease, or unspecified chronic kidney disease: Secondary | ICD-10-CM | POA: Diagnosis not present

## 2019-10-03 DIAGNOSIS — N189 Chronic kidney disease, unspecified: Secondary | ICD-10-CM | POA: Diagnosis not present

## 2019-10-03 DIAGNOSIS — E1122 Type 2 diabetes mellitus with diabetic chronic kidney disease: Secondary | ICD-10-CM | POA: Diagnosis not present

## 2019-10-03 DIAGNOSIS — M150 Primary generalized (osteo)arthritis: Secondary | ICD-10-CM | POA: Diagnosis not present

## 2019-10-10 DIAGNOSIS — N189 Chronic kidney disease, unspecified: Secondary | ICD-10-CM | POA: Diagnosis not present

## 2019-10-10 DIAGNOSIS — M150 Primary generalized (osteo)arthritis: Secondary | ICD-10-CM | POA: Diagnosis not present

## 2019-10-10 DIAGNOSIS — E1140 Type 2 diabetes mellitus with diabetic neuropathy, unspecified: Secondary | ICD-10-CM | POA: Diagnosis not present

## 2019-10-10 DIAGNOSIS — E1122 Type 2 diabetes mellitus with diabetic chronic kidney disease: Secondary | ICD-10-CM | POA: Diagnosis not present

## 2019-10-10 DIAGNOSIS — F0391 Unspecified dementia with behavioral disturbance: Secondary | ICD-10-CM | POA: Diagnosis not present

## 2019-10-10 DIAGNOSIS — I129 Hypertensive chronic kidney disease with stage 1 through stage 4 chronic kidney disease, or unspecified chronic kidney disease: Secondary | ICD-10-CM | POA: Diagnosis not present

## 2019-10-15 DIAGNOSIS — E1140 Type 2 diabetes mellitus with diabetic neuropathy, unspecified: Secondary | ICD-10-CM | POA: Diagnosis not present

## 2019-10-15 DIAGNOSIS — E1122 Type 2 diabetes mellitus with diabetic chronic kidney disease: Secondary | ICD-10-CM | POA: Diagnosis not present

## 2019-10-15 DIAGNOSIS — N189 Chronic kidney disease, unspecified: Secondary | ICD-10-CM | POA: Diagnosis not present

## 2019-10-15 DIAGNOSIS — F0391 Unspecified dementia with behavioral disturbance: Secondary | ICD-10-CM | POA: Diagnosis not present

## 2019-10-15 DIAGNOSIS — I129 Hypertensive chronic kidney disease with stage 1 through stage 4 chronic kidney disease, or unspecified chronic kidney disease: Secondary | ICD-10-CM | POA: Diagnosis not present

## 2019-10-15 DIAGNOSIS — M150 Primary generalized (osteo)arthritis: Secondary | ICD-10-CM | POA: Diagnosis not present

## 2019-10-16 ENCOUNTER — Other Ambulatory Visit: Payer: Self-pay

## 2019-10-16 ENCOUNTER — Encounter: Payer: Self-pay | Admitting: Endocrinology

## 2019-10-16 ENCOUNTER — Ambulatory Visit (INDEPENDENT_AMBULATORY_CARE_PROVIDER_SITE_OTHER): Payer: Medicare Other | Admitting: Endocrinology

## 2019-10-16 VITALS — BP 102/68 | HR 101 | Ht 64.0 in | Wt 219.6 lb

## 2019-10-16 DIAGNOSIS — E119 Type 2 diabetes mellitus without complications: Secondary | ICD-10-CM | POA: Diagnosis not present

## 2019-10-16 LAB — POCT GLYCOSYLATED HEMOGLOBIN (HGB A1C): Hemoglobin A1C: 8.7 % — AB (ref 4.0–5.6)

## 2019-10-16 MED ORDER — INSULIN LISPRO PROT & LISPRO (75-25 MIX) 100 UNIT/ML KWIKPEN: 34.0000 [IU] | PEN_INJECTOR | Freq: Every day | SUBCUTANEOUS | 11 refills | Status: DC

## 2019-10-16 NOTE — Progress Notes (Signed)
Subjective:    Patient ID: Angel Green, female    DOB: 28-Jan-1941, 79 y.o.   MRN: [id number]  HPI Pt returns for f/u of diabetes mellitus: DM type: Insulin-requiring type 2.   Dx'ed: 2355 Complications: PN, and CRI.   Therapy: insulin since 7322, and trulicity.   GDM: never.  DKA: never.   Severe hypoglycemia: once, in 2019.   Pancreatitis: never.  SDOH: rx is limited by memory loss; husband sets pen to # of units, and pt administers. Other: she changed to QAM premixed insulin, after poor results with multiple daily injections; pattern of cbg's indicates she does not need a PM dose.   Interval history: no cbg record, but husband states cbg's are in the 200's.  No new sxs.   Past Medical History:  Diagnosis Date  . Allergy    Dust Mites  . Ankle pain    chronic  . Diabetes mellitus, type 2 ([place])   . Hyperlipidemia   . Hypertension   . Memory loss   . Osteoarthritis   . RENAL INSUFFICIENCY, CHRONIC 04/16/2010  . Rhinitis    allergic nos    Past Surgical History:  Procedure Laterality Date  . CATARACT EXTRACTION    . CHOLECYSTECTOMY    . dental implants    . PARTIAL HYSTERECTOMY     in her 38s    Social History   Socioeconomic History  . Marital status: Married    Spouse name: Not on file  . Number of children: 2  . Years of education: some college  . Highest education level: Not on file  Occupational History  . Occupation:  retired  Tobacco Use  . Smoking status: Never Smoker  . Smokeless tobacco: Never Used  Vaping Use  . Vaping Use: Never used  Substance and Sexual Activity  . Alcohol use: No    Alcohol/week: 0.0 standard drinks  . Drug use: No  . Sexual activity: Yes    Partners: Male  Other Topics Concern  . Not on file  Social History Narrative   Husband is a Company secretary.   Right-handed.   1 cup caffeine daily.   Lives at home with husband.          Social Determinants of Health   Financial Resource Strain:   . Difficulty of Paying  Living Expenses: Not on file  Food Insecurity:   . Worried About Charity fundraiser in the Last Year: Not on file  . Ran Out of Food in the Last Year: Not on file  Transportation Needs:   . Lack of Transportation (Medical): Not on file  . Lack of Transportation (Non-Medical): Not on file  Physical Activity:   . Days of Exercise per Week: Not on file  . Minutes of Exercise per Session: Not on file  Stress:   . Feeling of Stress : Not on file  Social Connections:   . Frequency of Communication with Friends and Family: Not on file  . Frequency of Social Gatherings with Friends and Family: Not on file  . Attends Religious Services: Not on file  . Active Member of Clubs or Organizations: Not on file  . Attends Archivist Meetings: Not on file  . Marital Status: Not on file  Intimate Partner Violence:   . Fear of Current or Ex-Partner: Not on file  . Emotionally Abused: Not on file  . Physically Abused: Not on file  . Sexually Abused: Not on file    Current Outpatient  Medications on File Prior to Visit  Medication Sig Dispense Refill  . amLODipine (NORVASC) 5 MG tablet Take 1 tablet (5 mg total) by mouth daily. 90 tablet 1  . aspirin 81 MG tablet Take 81 mg by mouth daily.      Marland Kitchen atorvastatin (LIPITOR) 10 MG tablet Take 1 tablet (10 mg total) by mouth daily. 90 tablet 0  . Blood Glucose Monitoring Suppl (FREESTYLE LITE) DEVI Use to monitor your blood sugars (Patient taking differently: 1 each by Other route daily. E11.9) 1 each 0  . Cyanocobalamin (B-12 PO) Take 1 tablet by mouth daily.     Marland Kitchen donepezil (ARICEPT) 10 MG tablet Take 1 tablet (10 mg total) by mouth at bedtime. 90 tablet 2  . Dulaglutide (TRULICITY) 4.5 LN/9.8XQ SOPN Inject 4.5 mg into the skin once a week. 12 pen 3  . EPINEPHrine 0.3 mg/0.3 mL IJ SOAJ injection Inject 0.3 mg into the muscle once. Reported on 06/30/2015    . fluticasone (FLOVENT HFA) 44 MCG/ACT inhaler Inhale 2 puffs into the lungs 2 (two) times  daily. 3 Inhaler 3  . furosemide (LASIX) 20 MG tablet Take 2 tablets (40 mg total) by mouth daily. 180 tablet 1  . glucose blood (FREESTYLE LITE) test strip Use to monitor your blood sugars 4 times per day; E11.9 (Patient taking differently: 1 each by Other route daily. E11.9) 400 each 12  . Lancets (FREESTYLE) lancets Use to monitor blood sugars 4 times per day, once before each meal and once at bedtime (Patient taking differently: 1 each by Other route daily. E11.9) 400 each 12  . loratadine (CLARITIN) 10 MG tablet TAKE 1 TABLET DAILY 90 tablet 3  . losartan (COZAAR) 50 MG tablet Take 1 tablet (50 mg total) by mouth daily. 90 tablet 1  . memantine (NAMENDA) 10 MG tablet Take 1 tablet (10 mg total) by mouth 2 (two) times daily. 180 tablet 2  . Multiple Vitamins-Minerals (CENTRUM SILVER PO) Take 1 tablet by mouth daily.      Marland Kitchen omeprazole (PRILOSEC) 20 MG capsule Take 1 capsule (20 mg total) by mouth daily. 90 capsule 3  . Polyethyl Glycol-Propyl Glycol (SYSTANE) 0.4-0.3 % SOLN Apply 1 drop to eye daily as needed (for dry eyes).    . potassium chloride (KLOR-CON) 10 MEQ tablet Take 1 tablet (10 mEq total) by mouth daily. 7 tablet 0  . QUEtiapine (SEROQUEL) 25 MG tablet Take 1 tablet (25 mg total) by mouth at bedtime. 90 tablet 2  . SURE COMFORT PEN NEEDLES 31G X 5 MM MISC Use to inject insulin 1 time per day. 100 each 2   No current facility-administered medications on file prior to visit.    Allergies  Allergen Reactions  . Dust Mite Mixed Allergen Ext [Mite (D. Farinae)]     Family History  Problem Relation Age of Onset  . Healthy Brother   . Healthy Son   . Healthy Son   . Other Mother        unsure of history - "old age"  . Other Father        unsure of history - "old age"  . Heart attack Neg Hx   . Colon cancer Neg Hx   . Breast cancer Neg Hx   . Stroke Neg Hx     BP 102/68   Pulse (!) 101   Ht 5\' 4"  (1.626 m)   Wt 219 lb 9.6 oz (99.6 kg)   SpO2 95%   BMI 37.69  kg/m     Review of Systems Husband denies pt has hypoglycemia.      Objective:   Physical Exam VITAL SIGNS:  See vs page GENERAL: no distress Pulses: dorsalis pedis intact bilat.   MSK: no deformity of the feet CV: trace bilat leg edema.   Skin:  no ulcer on the feet, but there are heavy calluses  normal color and temp on the feet.   Neuro: sensation is intact to touch on the feet.    Lab Results  Component Value Date   HGBA1C 8.7 (A) 10/16/2019   Lab Results  Component Value Date   CREATININE 1.44 (H) 08/16/2019   BUN 13 08/16/2019   NA 141 08/16/2019   K 3.8 08/16/2019   CL 99 08/16/2019   CO2 31 08/16/2019    Lab Results  Component Value Date   TSH 1.18 08/16/2019      Assessment & Plan:  Insulin-requiring type 2 DM, with CRI: uncontrolled.  Patient Instructions  Please continue the same Trulicity, and: Increase the insulin to 34 units with breakfast Please come back for a follow-up appointment in 2 months.  check your blood sugar twice a day.  vary the time of day when you check, between before the 3 meals, and at bedtime.  also check if you have symptoms of your blood sugar being too high or too low.  please keep a record of the readings and bring it to your next appointment here (or you can bring the meter itself).  You can write it on any piece of paper.  please call us sooner if your blood sugar goes below 70, or if you have a lot of readings over 200.

## 2019-10-16 NOTE — Patient Instructions (Addendum)
Please continue the same Trulicity, and: Increase the insulin to 34 units with breakfast Please come back for a follow-up appointment in 2 months.  check your blood sugar twice a day.  vary the time of day when you check, between before the 3 meals, and at bedtime.  also check if you have symptoms of your blood sugar being too high or too low.  please keep a record of the readings and bring it to your next appointment here (or you can bring the meter itself).  You can write it on any piece of paper.  please call us sooner if your blood sugar goes below 70, or if you have a lot of readings over 200.

## 2019-10-27 ENCOUNTER — Other Ambulatory Visit: Payer: Self-pay | Admitting: Neurology

## 2019-10-28 ENCOUNTER — Other Ambulatory Visit: Payer: Self-pay | Admitting: Internal Medicine

## 2019-10-31 ENCOUNTER — Other Ambulatory Visit: Payer: Self-pay | Admitting: *Deleted

## 2019-10-31 ENCOUNTER — Telehealth: Payer: Self-pay | Admitting: Neurology

## 2019-10-31 MED ORDER — DONEPEZIL HCL 10 MG PO TABS: 10.0000 mg | ORAL_TABLET | Freq: Every day | ORAL | 1 refills | Status: DC

## 2019-10-31 MED ORDER — MEMANTINE HCL 10 MG PO TABS: 10.0000 mg | ORAL_TABLET | Freq: Two times a day (BID) | ORAL | 1 refills | Status: DC

## 2019-10-31 NOTE — Telephone Encounter (Signed)
I called [facility] and spoke to the pharmacist, Angel Green, who confirmed that there are refills on file for both donepezil and memantine. I called Angel Green back and he will go back into the pharmacy. I provided him with the name of her person I spoke to so that it will be easier for him.

## 2019-10-31 NOTE — Telephone Encounter (Signed)
Patient's husband has called to report that he is at the local [place]) for the small supply of the donepezil and memantine, and the pharmacy has no record of it.  Please reply.

## 2019-10-31 NOTE — Telephone Encounter (Signed)
[facility] will not refill Aricept without doctors order - memantine Hcl - completely out of will take too long for [facility] to send. Requesting refills on both Best call back [phone]

## 2019-10-31 NOTE — Telephone Encounter (Signed)
I spoke to the patient's husband. He is requesting a 90-day rx for donepezil and memantine be sent to [facility]. They have a small supply that was picked up from [place] to avoid disruption to treatment while waiting on the mail order delivery.

## 2019-11-05 ENCOUNTER — Telehealth: Payer: Self-pay | Admitting: Adult Health

## 2019-11-05 NOTE — Telephone Encounter (Signed)
Pt is needing her prescription refill for QUEtiapine (SEROQUEL) 25 MG tablet sent in to [facility].

## 2019-11-06 MED ORDER — QUETIAPINE FUMARATE 25 MG PO TABS: 25.0000 mg | ORAL_TABLET | Freq: Every day | ORAL | 2 refills | Status: DC

## 2019-11-06 NOTE — Telephone Encounter (Signed)
rx [facility]

## 2019-12-18 ENCOUNTER — Other Ambulatory Visit: Payer: Self-pay

## 2019-12-18 ENCOUNTER — Ambulatory Visit (INDEPENDENT_AMBULATORY_CARE_PROVIDER_SITE_OTHER): Payer: Medicare Other | Admitting: Internal Medicine

## 2019-12-18 ENCOUNTER — Encounter: Payer: Self-pay | Admitting: Internal Medicine

## 2019-12-18 VITALS — BP 134/84 | HR 98 | Temp 98.1°F | Resp 18 | Ht 64.0 in | Wt 221.5 lb

## 2019-12-18 DIAGNOSIS — I10 Essential (primary) hypertension: Secondary | ICD-10-CM

## 2019-12-18 DIAGNOSIS — F0390 Unspecified dementia without behavioral disturbance: Secondary | ICD-10-CM

## 2019-12-18 DIAGNOSIS — E785 Hyperlipidemia, unspecified: Secondary | ICD-10-CM | POA: Diagnosis not present

## 2019-12-18 DIAGNOSIS — Z23 Encounter for immunization: Secondary | ICD-10-CM

## 2019-12-18 NOTE — Patient Instructions (Signed)
Check the  blood pressure weekly BP GOAL is between 110/65 and  135/85. If it is consistently higher or lower, let me know    GO TO THE FRONT DESK, PLEASE SCHEDULE YOUR APPOINTMENTS Come back for a checkup in 5 to 6 months

## 2019-12-18 NOTE — Progress Notes (Signed)
Pre visit review using our clinic review tool, if applicable. No additional management support is needed unless otherwise documented below in the visit note. 

## 2019-12-18 NOTE — Progress Notes (Signed)
Subjective:    Patient ID: Angel Green, female    DOB: 1940-05-16, 79 y.o.   MRN: [id number]  DOS:  12/18/2019 Type of visit - description: Follow-up, here with her husband  The patient has dementia, reportedly doing okay.  She reports her appetite is good, she is not hurting anywhere. The last time she complained of fatigue, today she says she is doing well.  The husband denies any behavioral issue.  Review of Systems See above   Past Medical History:  Diagnosis Date  . Allergy    Dust Mites  . Ankle pain    chronic  . Diabetes mellitus, type 2 ([place])   . Hyperlipidemia   . Hypertension   . Memory loss   . Osteoarthritis   . RENAL INSUFFICIENCY, CHRONIC 04/16/2010  . Rhinitis    allergic nos    Past Surgical History:  Procedure Laterality Date  . CATARACT EXTRACTION    . CHOLECYSTECTOMY    . dental implants    . PARTIAL HYSTERECTOMY     in her 30s    Allergies as of 12/18/2019      Reactions   Dust Mite Mixed Allergen Ext [mite (d. Farinae)]       Medication List       Accurate as of December 18, 2019 11:59 PM. If you have any questions, ask your nurse or doctor.        amLODipine 5 MG tablet Commonly known as: NORVASC Take 1 tablet (5 mg total) by mouth daily.   aspirin 81 MG tablet Take 81 mg by mouth daily.   atorvastatin 10 MG tablet Commonly known as: LIPITOR Take 1 tablet (10 mg total) by mouth daily.   B-12 PO Take 1 tablet by mouth daily.   CENTRUM SILVER PO Take 1 tablet by mouth daily.   donepezil 10 MG tablet Commonly known as: ARICEPT Take 1 tablet (10 mg total) by mouth at bedtime.   EPINEPHrine 0.3 mg/0.3 mL Soaj injection Commonly known as: EPI-PEN Inject 0.3 mg into the muscle once. Reported on 06/30/2015   fluticasone 44 MCG/ACT inhaler Commonly known as: Flovent HFA Inhale 2 puffs into the lungs 2 (two) times daily.   freestyle lancets Use to monitor blood sugars 4 times per day, once before each meal and once at  bedtime What changed:   how much to take  how to take this  when to take this  additional instructions   FreeStyle Lite Devi Use to monitor your blood sugars What changed:   how much to take  how to take this  when to take this  additional instructions   FREESTYLE LITE test strip Generic drug: glucose blood Use to monitor your blood sugars 4 times per day; E11.9 What changed:   how much to take  how to take this  when to take this  additional instructions   furosemide 20 MG tablet Commonly known as: LASIX Take 2 tablets (40 mg total) by mouth daily.   Insulin Lispro Prot & Lispro (75-25) 100 UNIT/ML Kwikpen Commonly known as: HumaLOG Mix 75/25 KwikPen Inject 34 Units into the skin daily with breakfast. And pen needles 1/day   loratadine 10 MG tablet Commonly known as: CLARITIN TAKE 1 TABLET DAILY   losartan 50 MG tablet Commonly known as: COZAAR Take 1 tablet (50 mg total) by mouth daily.   memantine 10 MG tablet Commonly known as: NAMENDA Take 1 tablet (10 mg total) by mouth 2 (two) times daily.  omeprazole 20 MG capsule Commonly known as: PRILOSEC Take 1 capsule (20 mg total) by mouth daily.   potassium chloride 10 MEQ tablet Commonly known as: KLOR-CON Take 1 tablet (10 mEq total) by mouth daily.   QUEtiapine 25 MG tablet Commonly known as: SEROquel Take 1 tablet (25 mg total) by mouth at bedtime.   Sure Comfort Pen Needles 31G X 5 MM Misc Generic drug: Insulin Pen Needle Use to inject insulin 1 time per day.   Systane 0.4-0.3 % Soln Generic drug: Polyethyl Glycol-Propyl Glycol Apply 1 drop to eye daily as needed (for dry eyes).   Trulicity 4.5 TD/9.7CB Sopn Generic drug: Dulaglutide Inject 4.5 mg into the skin once a week.          Objective:   Physical Exam BP 134/84 (BP Location: Left Arm, Patient Position: Sitting, Cuff Size: Normal)   Pulse 98   Temp 98.1 F (36.7 C) (Oral)   Resp 18   Ht 5\' 4"  (1.626 m)   Wt 221 lb  8 oz (100.5 kg)   SpO2 96%   BMI 38.02 kg/m  General:   Well developed, NAD, BMI noted. HEENT:  Normocephalic . Face symmetric, atraumatic Lungs:  CTA B Normal respiratory effort, no intercostal retractions, no accessory muscle use. Heart: RRR,  no murmur.  Lower extremities: no pretibial edema bilaterally  Skin: Not pale. Not jaundice Neurologic:  alert & pleasantly demented. Follow commands, answer simple questions with yes or no Gait appropriate for age and unassisted Psych--  Behavior appropriate. No anxious or depressed appearing.      Assessment     Assessment   DM  Dr Loanne Drilling + Neuropathy: Per foot exam 12-2014 HTN ---change ACE to ARB is 03/2014 Hyperlipidemia CRI  dx 2012  Sees Dr Arty Baumgartner  Morbid obesity DJD, had  chronic ankle pain Allergies -- dust mites , occ uses a inhaler , sees allergist, has shots q weeks started ~ 08-2014 Dementia:  MMSE 7/218: 24, rx Aricept.  B12, RPR, sed rate and TSH normal MMSE 07/2017 :13 worse MRI brain 6/19:  prominent right temporal lobe volume loss.  PLAN DM: Per endocrinology. HTN: Well-controlled, has been check BPs at home and they are "okay".  Continue Lasix, amlodipine, losartan, potassium.  Last BMP satisfactory High cholesterol: On Lipitor, last FLP satisfactory. Dementia: Seems at baseline, no behavioral issues.  Continue Seroquel, Aricept, Namenda. Preventive care: Flu shot today The patient and her husband are encouraged to get a Covid vaccine booster. RTC 5 to 6 months     This visit occurred during the SARS-CoV-2 public health emergency.  Safety protocols were in place, including screening questions prior to the visit, additional usage of staff PPE, and extensive cleaning of exam room while observing appropriate contact time as indicated for disinfecting solutions.

## 2019-12-20 DIAGNOSIS — H40013 Open angle with borderline findings, low risk, bilateral: Secondary | ICD-10-CM | POA: Diagnosis not present

## 2019-12-20 DIAGNOSIS — Z961 Presence of intraocular lens: Secondary | ICD-10-CM | POA: Diagnosis not present

## 2019-12-20 DIAGNOSIS — E119 Type 2 diabetes mellitus without complications: Secondary | ICD-10-CM | POA: Diagnosis not present

## 2019-12-20 LAB — HM DIABETES EYE EXAM

## 2019-12-20 NOTE — Assessment & Plan Note (Signed)
DM: Per endocrinology. HTN: Well-controlled, has been check BPs at home and they are "okay".  Continue Lasix, amlodipine, losartan, potassium.  Last BMP satisfactory High cholesterol: On Lipitor, last FLP satisfactory. Dementia: Seems at baseline, no behavioral issues.  Continue Seroquel, Aricept, Namenda. Preventive care: Flu shot today The patient and her husband are encouraged to get a Covid vaccine booster. RTC 5 to 6 months

## 2019-12-21 ENCOUNTER — Encounter: Payer: Self-pay | Admitting: Internal Medicine

## 2019-12-24 DIAGNOSIS — Z6841 Body Mass Index (BMI) 40.0 and over, adult: Secondary | ICD-10-CM | POA: Diagnosis not present

## 2019-12-24 DIAGNOSIS — D631 Anemia in chronic kidney disease: Secondary | ICD-10-CM | POA: Diagnosis not present

## 2019-12-24 DIAGNOSIS — N189 Chronic kidney disease, unspecified: Secondary | ICD-10-CM | POA: Diagnosis not present

## 2019-12-24 DIAGNOSIS — I129 Hypertensive chronic kidney disease with stage 1 through stage 4 chronic kidney disease, or unspecified chronic kidney disease: Secondary | ICD-10-CM | POA: Diagnosis not present

## 2019-12-24 DIAGNOSIS — E785 Hyperlipidemia, unspecified: Secondary | ICD-10-CM | POA: Diagnosis not present

## 2019-12-24 DIAGNOSIS — F0390 Unspecified dementia without behavioral disturbance: Secondary | ICD-10-CM | POA: Diagnosis not present

## 2019-12-24 DIAGNOSIS — N2581 Secondary hyperparathyroidism of renal origin: Secondary | ICD-10-CM | POA: Diagnosis not present

## 2019-12-24 DIAGNOSIS — N1831 Chronic kidney disease, stage 3a: Secondary | ICD-10-CM | POA: Diagnosis not present

## 2019-12-24 LAB — BASIC METABOLIC PANEL
BUN: 13 (ref 4–21)
CO2: 27 — AB (ref 13–22)
Chloride: 101 (ref 99–108)
Creatinine: 1.6 — AB (ref 0.5–1.1)
Glucose: 187
Potassium: 3.9 (ref 3.4–5.3)
Sodium: 140 (ref 137–147)

## 2019-12-24 LAB — CBC AND DIFFERENTIAL
HCT: 46 (ref 36–46)
Hemoglobin: 14.6 (ref 12.0–16.0)
Neutrophils Absolute: 3.2
Platelets: 228 (ref 150–399)
WBC: 7.1

## 2019-12-24 LAB — CBC: RBC: 4.58 (ref 3.87–5.11)

## 2019-12-24 LAB — COMPREHENSIVE METABOLIC PANEL
Albumin: 4.1 (ref 3.5–5.0)
Calcium: 9.6 (ref 8.7–10.7)
GFR calc Af Amer: 34
GFR calc non Af Amer: 30
Globulin: 3.3

## 2019-12-24 LAB — IRON,TIBC AND FERRITIN PANEL
%SAT: 31
Ferritin: 185
Iron: 86
TIBC: 281
UIBC: 195

## 2019-12-24 LAB — HEPATIC FUNCTION PANEL
ALT: 26 (ref 7–35)
AST: 29 (ref 13–35)
Alkaline Phosphatase: 96 (ref 25–125)
Bilirubin, Total: 0.4

## 2019-12-25 ENCOUNTER — Ambulatory Visit (INDEPENDENT_AMBULATORY_CARE_PROVIDER_SITE_OTHER): Payer: Medicare Other | Admitting: Endocrinology

## 2019-12-25 ENCOUNTER — Other Ambulatory Visit: Payer: Self-pay

## 2019-12-25 ENCOUNTER — Encounter: Payer: Self-pay | Admitting: Endocrinology

## 2019-12-25 VITALS — BP 138/70 | HR 98 | Ht 64.0 in | Wt 221.0 lb

## 2019-12-25 DIAGNOSIS — E119 Type 2 diabetes mellitus without complications: Secondary | ICD-10-CM

## 2019-12-25 DIAGNOSIS — N1831 Chronic kidney disease, stage 3a: Secondary | ICD-10-CM | POA: Diagnosis not present

## 2019-12-25 MED ORDER — INSULIN LISPRO PROT & LISPRO (75-25 MIX) 100 UNIT/ML KWIKPEN: 38.0000 [IU] | PEN_INJECTOR | Freq: Every day | SUBCUTANEOUS | 3 refills | Status: DC

## 2019-12-25 NOTE — Progress Notes (Signed)
Subjective:    Patient ID: Angel Green, female    DOB: 12-20-1940, 79 y.o.   MRN: [id number]  HPI Pt returns for f/u of diabetes mellitus: DM type: Insulin-requiring type 2.   Dx'ed: 1194 Complications: PN, and CRI.   Therapy: insulin since 1740, and trulicity.   GDM: never.  DKA: never.   Severe hypoglycemia: once, in 2019.   Pancreatitis: never.  SDOH: rx is limited by memory loss; husband sets pen to # of units, and pt administers. Other: she changed to QAM premixed insulin, after poor results with multiple daily injections; pattern of cbg's indicates she does not need a PM dose.   Interval history: no cbg record, but husband states cbg's are in the 200's.  No new sxs.  husb says he never misses giving pt meds.   Past Medical History:  Diagnosis Date  . Allergy    Dust Mites  . Ankle pain    chronic  . Diabetes mellitus, type 2 ([place])   . Hyperlipidemia   . Hypertension   . Memory loss   . Osteoarthritis   . RENAL INSUFFICIENCY, CHRONIC 04/16/2010  . Rhinitis    allergic nos    Past Surgical History:  Procedure Laterality Date  . CATARACT EXTRACTION    . CHOLECYSTECTOMY    . dental implants    . PARTIAL HYSTERECTOMY     in her 48s    Social History   Socioeconomic History  . Marital status: Married    Spouse name: Not on file  . Number of children: 2  . Years of education: some college  . Highest education level: Not on file  Occupational History  . Occupation:  retired  Tobacco Use  . Smoking status: Never Smoker  . Smokeless tobacco: Never Used  Vaping Use  . Vaping Use: Never used  Substance and Sexual Activity  . Alcohol use: No    Alcohol/week: 0.0 standard drinks  . Drug use: No  . Sexual activity: Yes    Partners: Male  Other Topics Concern  . Not on file  Social History Narrative   Husband is a Company secretary.   Right-handed.   1 cup caffeine daily.   Lives at home with husband.          Social Determinants of Health   Financial  Resource Strain:   . Difficulty of Paying Living Expenses: Not on file  Food Insecurity:   . Worried About Charity fundraiser in the Last Year: Not on file  . Ran Out of Food in the Last Year: Not on file  Transportation Needs:   . Lack of Transportation (Medical): Not on file  . Lack of Transportation (Non-Medical): Not on file  Physical Activity:   . Days of Exercise per Week: Not on file  . Minutes of Exercise per Session: Not on file  Stress:   . Feeling of Stress : Not on file  Social Connections:   . Frequency of Communication with Friends and Family: Not on file  . Frequency of Social Gatherings with Friends and Family: Not on file  . Attends Religious Services: Not on file  . Active Member of Clubs or Organizations: Not on file  . Attends Archivist Meetings: Not on file  . Marital Status: Not on file  Intimate Partner Violence:   . Fear of Current or Ex-Partner: Not on file  . Emotionally Abused: Not on file  . Physically Abused: Not on file  . Sexually  Abused: Not on file    Current Outpatient Medications on File Prior to Visit  Medication Sig Dispense Refill  . amLODipine (NORVASC) 5 MG tablet Take 1 tablet (5 mg total) by mouth daily. 90 tablet 1  . aspirin 81 MG tablet Take 81 mg by mouth daily.      Marland Kitchen atorvastatin (LIPITOR) 10 MG tablet Take 1 tablet (10 mg total) by mouth daily. 90 tablet 1  . Blood Glucose Monitoring Suppl (FREESTYLE LITE) DEVI Use to monitor your blood sugars (Patient taking differently: 1 each by Other route daily. E11.9) 1 each 0  . Cyanocobalamin (B-12 PO) Take 1 tablet by mouth daily.     Marland Kitchen donepezil (ARICEPT) 10 MG tablet Take 1 tablet (10 mg total) by mouth at bedtime. 90 tablet 1  . Dulaglutide (TRULICITY) 4.5 GE/3.6OQ SOPN Inject 4.5 mg into the skin once a week. 12 pen 3  . EPINEPHrine 0.3 mg/0.3 mL IJ SOAJ injection Inject 0.3 mg into the muscle once. Reported on 06/30/2015    . fluticasone (FLOVENT HFA) 44 MCG/ACT inhaler  Inhale 2 puffs into the lungs 2 (two) times daily. 3 Inhaler 3  . furosemide (LASIX) 20 MG tablet Take 2 tablets (40 mg total) by mouth daily. 180 tablet 1  . glucose blood (FREESTYLE LITE) test strip Use to monitor your blood sugars 4 times per day; E11.9 (Patient taking differently: 1 each by Other route daily. E11.9) 400 each 12  . Lancets (FREESTYLE) lancets Use to monitor blood sugars 4 times per day, once before each meal and once at bedtime (Patient taking differently: 1 each by Other route daily. E11.9) 400 each 12  . loratadine (CLARITIN) 10 MG tablet TAKE 1 TABLET DAILY 90 tablet 3  . losartan (COZAAR) 50 MG tablet Take 1 tablet (50 mg total) by mouth daily. 90 tablet 1  . memantine (NAMENDA) 10 MG tablet Take 1 tablet (10 mg total) by mouth 2 (two) times daily. 180 tablet 1  . Multiple Vitamins-Minerals (CENTRUM SILVER PO) Take 1 tablet by mouth daily.      Marland Kitchen omeprazole (PRILOSEC) 20 MG capsule Take 1 capsule (20 mg total) by mouth daily. 90 capsule 3  . Polyethyl Glycol-Propyl Glycol (SYSTANE) 0.4-0.3 % SOLN Apply 1 drop to eye daily as needed (for dry eyes).    . potassium chloride (KLOR-CON) 10 MEQ tablet Take 1 tablet (10 mEq total) by mouth daily. 7 tablet 0  . QUEtiapine (SEROQUEL) 25 MG tablet Take 1 tablet (25 mg total) by mouth at bedtime. 90 tablet 2  . SURE COMFORT PEN NEEDLES 31G X 5 MM MISC Use to inject insulin 1 time per day. 100 each 2   No current facility-administered medications on file prior to visit.    Allergies  Allergen Reactions  . Dust Mite Mixed Allergen Ext [Mite (D. Farinae)]     Family History  Problem Relation Age of Onset  . Healthy Brother   . Healthy Son   . Healthy Son   . Other Mother        unsure of history - "old age"  . Other Father        unsure of history - "old age"  . Heart attack Neg Hx   . Colon cancer Neg Hx   . Breast cancer Neg Hx   . Stroke Neg Hx     BP 138/70   Pulse 98   Ht 5\' 4"  (1.626 m)   Wt 221 lb (100.2 kg)  SpO2 99%   BMI 37.93 kg/m    Review of Systems husb denies pt has hypoglycemia.  She denies nausea.     Objective:   Physical Exam VITAL SIGNS:  See vs page.  GENERAL: no distress.   Pulses: dorsalis pedis intact bilat.   MSK: no deformity of the feet.   CV: trace bilat leg edema.  Skin:  no ulcer on the feet.  normal color and temp on the feet.  Neuro: sensation is intact to touch on the feet.    A1c=8.8%  Lab Results  Component Value Date   TSH 1.18 08/16/2019        Assessment & Plan:  Insulin-requiring type 2 DM: uncontrolled.   Patient Instructions  Please continue the same Trulicity, and:  Increase the insulin to 38 units with breakfast.  Please come back for a follow-up appointment in 3 months.  check your blood sugar twice a day.  vary the time of day when you check, between before the 3 meals, and at bedtime.  also check if you have symptoms of your blood sugar being too high or too low.  please keep a record of the readings and bring it to your next appointment here (or you can bring the meter itself).  You can write it on any piece of paper.  please call us sooner if your blood sugar goes below 70, or if you have a lot of readings over 200.

## 2019-12-25 NOTE — Patient Instructions (Addendum)
Please continue the same Trulicity, and:  Increase the insulin to 38 units with breakfast.  Please come back for a follow-up appointment in 3 months.  check your blood sugar twice a day.  vary the time of day when you check, between before the 3 meals, and at bedtime.  also check if you have symptoms of your blood sugar being too high or too low.  please keep a record of the readings and bring it to your next appointment here (or you can bring the meter itself).  You can write it on any piece of paper.  please call us sooner if your blood sugar goes below 70, or if you have a lot of readings over 200.

## 2019-12-31 ENCOUNTER — Other Ambulatory Visit: Payer: Self-pay | Admitting: Internal Medicine

## 2019-12-31 DIAGNOSIS — Z1231 Encounter for screening mammogram for malignant neoplasm of breast: Secondary | ICD-10-CM

## 2020-01-28 ENCOUNTER — Other Ambulatory Visit: Payer: Self-pay | Admitting: Internal Medicine

## 2020-01-31 ENCOUNTER — Other Ambulatory Visit: Payer: Self-pay

## 2020-01-31 ENCOUNTER — Ambulatory Visit (INDEPENDENT_AMBULATORY_CARE_PROVIDER_SITE_OTHER): Payer: Medicare Other | Admitting: Adult Health

## 2020-01-31 ENCOUNTER — Encounter: Payer: Self-pay | Admitting: Adult Health

## 2020-01-31 VITALS — BP 128/72 | HR 74 | Ht 64.0 in | Wt 218.0 lb

## 2020-01-31 DIAGNOSIS — F0390 Unspecified dementia without behavioral disturbance: Secondary | ICD-10-CM | POA: Diagnosis not present

## 2020-01-31 MED ORDER — DONEPEZIL HCL 10 MG PO TABS: 10.0000 mg | ORAL_TABLET | Freq: Every day | ORAL | 3 refills | Status: DC

## 2020-01-31 MED ORDER — QUETIAPINE FUMARATE 25 MG PO TABS: 25.0000 mg | ORAL_TABLET | Freq: Every day | ORAL | 3 refills | Status: DC

## 2020-01-31 MED ORDER — MEMANTINE HCL 10 MG PO TABS: 10.0000 mg | ORAL_TABLET | Freq: Two times a day (BID) | ORAL | 3 refills | Status: DC

## 2020-01-31 NOTE — Patient Instructions (Addendum)
Your Plan:  Continue Namenda, Aricept and Seroquel 25mg  nightly  We will do an EEG due to quick decline of her memory per testing - you will schedule appointment today  We will do lab work to look for reversible causes of dementia    Follow up in 6 months or call earlier if needed     Thank you for coming to see [place] at [facility]. I hope we have been able to provide you high quality care today.  You may receive a patient satisfaction survey over the next few weeks. We would appreciate your feedback and comments so that we may continue to improve ourselves and the health of our patients.    Electroencephalogram, Adult An electroencephalogram (EEG) is a test that records electrical activity in the brain. It is often used to diagnose or monitor problems that are related to the brain, such as:  Seizure disorders.  Sleeping problems.  Changes in behavior.  Head injuries.  Fainting spells. Tell a health care provider about:  Any allergies you have.  All medicines you are taking, including vitamins, herbs, eye drops, creams, and over-the-counter medicines.  Any problems you or family members have had with anesthetic medicines.  Any blood disorders you have.  Any surgeries you have had.  Any medical conditions you have or have had, including psychiatric conditions.  Any history of illegal drug use or alcohol abuse. What are the risks? Generally, this is a safe test. If you have a seizure disorder, you may be made to have a seizure during the test. This is done so that your brain activity can be recorded during the seizure. What happens before the procedure?   Arrive with your hair clean and dry. Do not comb your hair toward your scalp to add volume (backcomb), and do not put hair spray, oil, or other hair products in your hair.  Do not have caffeine within 4 hours of having your test.  Follow instructions from your health care provider  about: ? Other eating or drinking restrictions. ? Sleeping before the test.  Ask your health care provider about taking your regular and over-the-counter medicines, herbs, and supplements. What happens during the procedure? You will be asked to sit in a chair or lie down. Small metal discs (electrodes) will be attached to your head with an adhesive. These electrodes will pick up on the signals in your brain, and a machine will record the signals. During the test, you may be asked to:  Sit or lie quietly and relax.  Open and close your eyes.  Breathe deeply and rapidly for 3 minutes or longer.  Look at a flashing light for a short period of time.  Read or look at an image.  Go to sleep. When the test is complete, the electrodes will be removed by using a solution such as acetone. What happens after the procedure? It is up to you to get the results of your procedure. Ask your health care provider, or the department that is doing the procedure, when your results will be ready. Summary  An electroencephalogram (EEG) is a test that is often used to diagnose or monitor problems that are related to the brain.  Do not have caffeine within 4 hours of having your test. Follow other instructions from your health care provider about eating and drinking before the test.  During the procedure, small metal discs (electrodes) will be attached to your head with an adhesive.  During the test, you may  be asked to sit or lie quietly and relax. You may also be asked to do other activities during the test. This information is not intended to replace advice given to you by your health care provider. Make sure you discuss any questions you have with your health care provider. Document Revised: 12/13/2017 Document Reviewed: 12/13/2017 Elsevier Patient Education  2020 [employer].

## 2020-01-31 NOTE — Progress Notes (Signed)
[facility] [address] [place]. [place] [address] [phone]       OFFICE FOLLOW UP NOTE  [name] Date of Birth:  1940-06-20 Medical Record Number:  [id number]   Reason for visit: Dementia    SUBJECTIVE:   CHIEF COMPLAINT:  Chief Complaint  Patient presents with  . Follow-up    Memory fu, rm 9, with husband , both state she is stable    HPI:   Angel Green is a 79 year old female with underlying medical history of diabetes, chronic renal insufficiency, HTN and dementia.  She was initially evaluated by Dr. Krista Blue on 10/18/2017 for dementia with behavioral disturbance.  Unable to review office notes as they are labeled "sensitive notes" in which I do not have access to.  She was previously seen by Dr. Krista Blue on 07/06/2018 where she continued on Seroquel, Namenda and Aricept.  Today, 01/31/2020, Angel Green returns for 44-month follow-up accompanied by her husband regarding dementia.  MMSE today 6/30 (6 mo prior 15/30).  Remains on Seroquel, Namenda and Aricept tolerating without side effects.  Subjectively, dementia has been stable per husband and wife. No behavioral concerns and sleeps well throughout the night.  Husband reports patient starting her on Prevagen 1 month ago.  No concerns at this time.    ROS:   14 system review of systems performed and negative with exception of memory loss  PMH:  Past Medical History:  Diagnosis Date  . Allergy    Dust Mites  . Ankle pain    chronic  . Diabetes mellitus, type 2 ([place])   . Hyperlipidemia   . Hypertension   . Memory loss   . Osteoarthritis   . RENAL INSUFFICIENCY, CHRONIC 04/16/2010  . Rhinitis    allergic nos    PSH:  Past Surgical History:  Procedure Laterality Date  . CATARACT EXTRACTION    . CHOLECYSTECTOMY    . dental implants    . PARTIAL HYSTERECTOMY     in her 61s    Social History:  Social History   Socioeconomic History  . Marital status: Married    Spouse name: Not  on file  . Number of children: 2  . Years of education: some college  . Highest education level: Not on file  Occupational History  . Occupation:  retired  Tobacco Use  . Smoking status: Never Smoker  . Smokeless tobacco: Never Used  Vaping Use  . Vaping Use: Never used  Substance and Sexual Activity  . Alcohol use: No    Alcohol/week: 0.0 standard drinks  . Drug use: No  . Sexual activity: Yes    Partners: Male  Other Topics Concern  . Not on file  Social History Narrative   Husband is a Company secretary.   Right-handed.   1 cup caffeine daily.   Lives at home with husband.          Social Determinants of Health   Financial Resource Strain: Not on file  Food Insecurity: Not on file  Transportation Needs: Not on file  Physical Activity: Not on file  Stress: Not on file  Social Connections: Not on file  Intimate Partner Violence: Not on file    Family History:  Family History  Problem Relation Age of Onset  . Healthy Brother   . Healthy Son   . Healthy Son   . Other Mother        unsure of history - "old age"  . Other Father  unsure of history - "old age"  . Heart attack Neg Hx   . Colon cancer Neg Hx   . Breast cancer Neg Hx   . Stroke Neg Hx     Medications:   Current Outpatient Medications on File Prior to Visit  Medication Sig Dispense Refill  . amLODipine (NORVASC) 5 MG tablet Take 1 tablet (5 mg total) by mouth daily. 90 tablet 1  . aspirin 81 MG tablet Take 81 mg by mouth daily.      Marland Kitchen atorvastatin (LIPITOR) 10 MG tablet Take 1 tablet (10 mg total) by mouth daily. 90 tablet 1  . Blood Glucose Monitoring Suppl (FREESTYLE LITE) DEVI Use to monitor your blood sugars (Patient taking differently: 1 each by Other route daily. E11.9) 1 each 0  . Cyanocobalamin (B-12 PO) Take 1 tablet by mouth daily.     Marland Kitchen donepezil (ARICEPT) 10 MG tablet Take 1 tablet (10 mg total) by mouth at bedtime. 90 tablet 1  . Dulaglutide (TRULICITY) 4.5 NW/2.9FA SOPN Inject 4.5 mg  into the skin once a week. 12 pen 3  . EPINEPHrine 0.3 mg/0.3 mL IJ SOAJ injection Inject 0.3 mg into the muscle once. Reported on 06/30/2015    . fluticasone (FLOVENT HFA) 44 MCG/ACT inhaler Inhale 2 puffs into the lungs 2 (two) times daily. 3 Inhaler 3  . furosemide (LASIX) 20 MG tablet Take 2 tablets (40 mg total) by mouth daily. 180 tablet 1  . glucose blood (FREESTYLE LITE) test strip Use to monitor your blood sugars 4 times per day; E11.9 (Patient taking differently: 1 each by Other route daily. E11.9) 400 each 12  . Insulin Lispro Prot & Lispro (HUMALOG MIX 75/25 KWIKPEN) (75-25) 100 UNIT/ML Kwikpen Inject 38 Units into the skin daily with breakfast. And pen needles 1/day 45 mL 3  . Lancets (FREESTYLE) lancets Use to monitor blood sugars 4 times per day, once before each meal and once at bedtime (Patient taking differently: 1 each by Other route daily. E11.9) 400 each 12  . loratadine (CLARITIN) 10 MG tablet TAKE 1 TABLET DAILY 90 tablet 3  . losartan (COZAAR) 50 MG tablet Take 1 tablet (50 mg total) by mouth daily. 90 tablet 1  . memantine (NAMENDA) 10 MG tablet Take 1 tablet (10 mg total) by mouth 2 (two) times daily. 180 tablet 1  . Multiple Vitamins-Minerals (CENTRUM SILVER PO) Take 1 tablet by mouth daily.      Marland Kitchen omeprazole (PRILOSEC) 20 MG capsule Take 1 capsule (20 mg total) by mouth daily. 90 capsule 3  . Polyethyl Glycol-Propyl Glycol (SYSTANE) 0.4-0.3 % SOLN Apply 1 drop to eye daily as needed (for dry eyes).    . potassium chloride (KLOR-CON) 10 MEQ tablet Take 1 tablet (10 mEq total) by mouth daily. 7 tablet 0  . QUEtiapine (SEROQUEL) 25 MG tablet Take 1 tablet (25 mg total) by mouth at bedtime. 90 tablet 2  . SURE COMFORT PEN NEEDLES 31G X 5 MM MISC Use to inject insulin 1 time per day. 100 each 2   No current facility-administered medications on file prior to visit.    Allergies:   Allergies  Allergen Reactions  . Dust Mite Mixed Allergen Ext [Mite (D. Farinae)]        OBJECTIVE:  Physical Exam  Vitals:   01/31/20 0944  BP: 128/72  Pulse: 74  Weight: 218 lb (98.9 kg)  Height: 5\' 4"  (1.626 m)   Body mass index is 37.42 kg/m. No exam data present  General: well developed, well nourished, very pleasant elderly African-American female, seated, in no evident distress Head: head normocephalic and atraumatic.   Neck: supple with no carotid or supraclavicular bruits Cardiovascular: regular rate and rhythm, no murmurs Musculoskeletal: no deformity Skin:  no rash/petichiae Vascular:  Normal pulses all extremities   Neurologic Exam Mental Status: Awake and fully alert.   Husband provides majority of today's history.  Mood and affect appropriate.   MMSE - Mini Mental State Exam 01/31/2020 08/09/2019 11/14/2018 04/20/2018 11/11/2017 10/18/2017 03/05/2016  Not completed: - - Unable to complete - - - -  Orientation to time 0 1 - 3 3 1 5   Orientation to Place 0 3 - 4 5 4 5   Registration 3 3 - 3 3 3 3   Attention/ Calculation 0 0 - 2 2 2 5   Recall 0 0 - 0 3 0 2  Language- name 2 objects 2 2 - 2 2 2 2   Language- repeat 1 1 - 1 1 1 1   Language- follow 3 step command 0 3 - 3 0 3 3  Language- read & follow direction 0 1 - 1 1 1 1   Write a sentence 0 1 - 1 1 1 1   Copy design 0 0 - 1 0 1 1  Copy design-comments - 4 animals - - - - -  Total score 6 15 - 21 21 19 29    Cranial Nerves: Pupils equal, briskly reactive to light. Extraocular movements full without nystagmus. Visual fields full to confrontation. Hearing intact. Facial sensation intact. Face, tongue, palate moves normally and symmetrically.  Motor: Normal bulk and tone. Normal strength in all tested extremity muscles. Sensory.: intact to touch , pinprick , position and vibratory sensation.  Coordination: Rapid alternating movements normal in all extremities. Finger-to-nose and heel-to-shin performed accurately bilaterally. Gait and Station: Arises from chair without difficulty. Stance is normal.  Gait demonstrates normal stride length and balance without use of assistive device Reflexes: 1+ and symmetric. Toes downgoing.       ASSESSMENT/PLAN: Aleayah Chico is a 79 y.o. year old female with underlying medical history of DM, HTN, chronic renal dysfunction and dementia.  Initially evaluated by Dr Krista Blue in 2019 with prior visit 06/2018.     -MMSE today 6/30; 6 mo prior 15/30;  <78yrs ago 21/30 -due to progressive decline, recommend obtaining EEG and dementia panel to rule out possible underlying or reversible causes -As cognition subjectively stable her husband and patient, recommend continuing Seroquel 25 mg nightly, Aricept 10 mg nightly and Namenda 10 mg twice daily.  Refills provided.   Follow up in 6 months or call earlier if needed   CC:  [facility] provider: Dr. Mayer Masker, Alda Berthold, MD     I spent 30 minutes of face-to-face and non-face-to-face time with patient and husband.  This included previsit chart review, lab review, study review, order entry, electronic health record documentation, patient and husband discussion and education regarding dementia with progressive decline, indication for further evaluation to rule out reversible causes, ongoing use of medications and answered all other questions to patient and husband satisfaction  Frann Rider, AGNP-BC  [facility] [address] [place], [place] [address]  Phone [phone] Fax [phone] Note: This document was prepared with digital dictation and possible smart phrase technology. Any transcriptional errors that result from this process are unintentional.

## 2020-02-01 LAB — DEMENTIA PANEL
Homocysteine: 8.7 umol/L (ref 0.0–21.3)
RPR Ser Ql: NONREACTIVE
TSH: 1.68 u[IU]/mL (ref 0.450–4.500)
Vitamin B-12: >2000 pg/mL — ABNORMAL HIGH (ref 232–1245)

## 2020-02-04 ENCOUNTER — Encounter: Payer: Self-pay | Admitting: Internal Medicine

## 2020-02-04 ENCOUNTER — Ambulatory Visit (INDEPENDENT_AMBULATORY_CARE_PROVIDER_SITE_OTHER): Payer: Medicare Other | Admitting: Neurology

## 2020-02-04 ENCOUNTER — Other Ambulatory Visit: Payer: Self-pay

## 2020-02-04 DIAGNOSIS — F0390 Unspecified dementia without behavioral disturbance: Secondary | ICD-10-CM

## 2020-02-04 DIAGNOSIS — R410 Disorientation, unspecified: Secondary | ICD-10-CM

## 2020-02-05 ENCOUNTER — Telehealth: Payer: Self-pay

## 2020-02-05 NOTE — Telephone Encounter (Signed)
-----   Message from Frann Rider, NP sent at 02/05/2020  7:15 AM EST ----- Please advise patient/family that recent lab work did not show evidence of reversible causes of memory loss.  Elevated B12 level but is currently on supplementation.  Thank you.

## 2020-02-05 NOTE — Telephone Encounter (Signed)
Attempted to call pt, LVM for labresults per DPR. Ask pt to call back for questions or concerns.

## 2020-02-13 ENCOUNTER — Ambulatory Visit
Admission: RE | Admit: 2020-02-13 | Discharge: 2020-02-13 | Disposition: A | Discharge status: Home / Self Care | Payer: Medicare Other | Source: Ambulatory Visit | Attending: Internal Medicine | Admitting: Internal Medicine

## 2020-02-13 ENCOUNTER — Other Ambulatory Visit: Payer: Self-pay

## 2020-02-13 DIAGNOSIS — Z1231 Encounter for screening mammogram for malignant neoplasm of breast: Secondary | ICD-10-CM

## 2020-02-14 NOTE — Procedures (Signed)
   HISTORY: 79 year old female presented with memory loss  TECHNIQUE:  This is a routine 16 channel EEG recording with one channel devoted to a limited EKG recording.  It was performed during wakefulness, drowsiness and asleep.  Hyperventilation and photic stimulation were performed as activating procedures.  There are frequent electrode and movement artifact.Marland Kitchen  Upon maximum arousal, posterior dominant waking rhythm consistent of mild dysrhythmic alpha range activity. Hyperventilation produced mild/moderate buildup with higher amplitude and the slower activities noted.  Photic stimulation did not alter the tracing.  During EEG recording, patient developed drowsiness and no deeper stage of sleep was achieved During EEG recording, there was no epileptiform discharge noted.  EKG demonstrate sinus rhythm, with heart rate of 84 bpm  CONCLUSION: This is a  normal awake EEG.  There is no electrodiagnostic evidence of epileptiform discharge.  Marcial Pacas, M.D. Ph.D.  [facility] [place], [place] [address] Phone: [phone] Fax:      [phone]

## 2020-02-18 ENCOUNTER — Telehealth: Payer: Self-pay | Admitting: *Deleted

## 2020-02-18 NOTE — Telephone Encounter (Signed)
Spoke with husband and relayed that  recent lab work did not show evidence of reversible causes of memory loss.  Elevated B12 level but is currently on supplementation. He verbalized understanding.

## 2020-03-06 ENCOUNTER — Other Ambulatory Visit: Payer: Self-pay | Admitting: Internal Medicine

## 2020-03-24 ENCOUNTER — Other Ambulatory Visit: Payer: Self-pay

## 2020-03-26 ENCOUNTER — Other Ambulatory Visit: Payer: Self-pay

## 2020-03-26 ENCOUNTER — Ambulatory Visit (INDEPENDENT_AMBULATORY_CARE_PROVIDER_SITE_OTHER): Payer: Medicare Other | Admitting: Endocrinology

## 2020-03-26 VITALS — BP 138/78 | HR 97 | Ht 64.5 in | Wt 215.2 lb

## 2020-03-26 DIAGNOSIS — R200 Anesthesia of skin: Secondary | ICD-10-CM | POA: Diagnosis not present

## 2020-03-26 DIAGNOSIS — E119 Type 2 diabetes mellitus without complications: Secondary | ICD-10-CM

## 2020-03-26 LAB — POCT GLYCOSYLATED HEMOGLOBIN (HGB A1C): Hemoglobin A1C: 7.6 % — AB (ref 4.0–5.6)

## 2020-03-26 NOTE — Progress Notes (Signed)
Subjective:    Patient ID: Angel Green, female    DOB: March 14, 1940, 80 y.o.   MRN: [id number]  HPI Pt returns for f/u of diabetes mellitus: DM type: Insulin-requiring type 2.   Dx'ed: 8295 Complications: PN, and CRI.   Therapy: insulin since 6213, and trulicity.   GDM: never.  DKA: never.   Severe hypoglycemia: once, in 2019.   Pancreatitis: never.  SDOH: rx is limited by memory loss; husband sets pen to # of units, and pt administers. Other: she changed to QAM premixed insulin, after poor results with multiple daily injections; pattern of cbg's indicates she does not need a PM dose.   Interval history: no cbg record, but husband states cbg's are approx 200.  No new sxs.  husb says he never misses giving pt meds.   Past Medical History:  Diagnosis Date  . Allergy    Dust Mites  . Ankle pain    chronic  . Diabetes mellitus, type 2 ([place])   . Hyperlipidemia   . Hypertension   . Memory loss   . Osteoarthritis   . RENAL INSUFFICIENCY, CHRONIC 04/16/2010  . Rhinitis    allergic nos    Past Surgical History:  Procedure Laterality Date  . CATARACT EXTRACTION    . CHOLECYSTECTOMY    . dental implants    . PARTIAL HYSTERECTOMY     in her 60s    Social History   Socioeconomic History  . Marital status: Married    Spouse name: Not on file  . Number of children: 2  . Years of education: some college  . Highest education level: Not on file  Occupational History  . Occupation:  retired  Tobacco Use  . Smoking status: Never Smoker  . Smokeless tobacco: Never Used  Vaping Use  . Vaping Use: Never used  Substance and Sexual Activity  . Alcohol use: No    Alcohol/week: 0.0 standard drinks  . Drug use: No  . Sexual activity: Yes    Partners: Male  Other Topics Concern  . Not on file  Social History Narrative   Husband is a Company secretary.   Right-handed.   1 cup caffeine daily.   Lives at home with husband.          Social Determinants of Health   Financial  Resource Strain: Not on file  Food Insecurity: Not on file  Transportation Needs: Not on file  Physical Activity: Not on file  Stress: Not on file  Social Connections: Not on file  Intimate Partner Violence: Not on file    Current Outpatient Medications on File Prior to Visit  Medication Sig Dispense Refill  . amLODipine (NORVASC) 5 MG tablet Take 1 tablet (5 mg total) by mouth daily. 90 tablet 1  . Apoaequorin 20 MG CAPS Take 20 mg by mouth daily.    Marland Kitchen aspirin 81 MG tablet Take 81 mg by mouth daily.    Marland Kitchen atorvastatin (LIPITOR) 10 MG tablet Take 1 tablet (10 mg total) by mouth daily. 90 tablet 1  . Blood Glucose Monitoring Suppl (FREESTYLE LITE) DEVI Use to monitor your blood sugars (Patient taking differently: 1 each by Other route daily. E11.9) 1 each 0  . Cyanocobalamin (B-12 PO) Take 1 tablet by mouth daily. Takes 5000 mcg by mouth daily.    Marland Kitchen donepezil (ARICEPT) 10 MG tablet Take 1 tablet (10 mg total) by mouth at bedtime. 90 tablet 3  . Dulaglutide (TRULICITY) 4.5 YQ/6.5HQ SOPN Inject 4.5 mg into  the skin once a week. 12 pen 3  . EPINEPHrine 0.3 mg/0.3 mL IJ SOAJ injection Inject 0.3 mg into the muscle once. Reported on 06/30/2015    . fluticasone (FLOVENT HFA) 44 MCG/ACT inhaler Inhale 2 puffs into the lungs 2 (two) times daily. 3 Inhaler 3  . furosemide (LASIX) 20 MG tablet Take 2 tablets (40 mg total) by mouth daily. 180 tablet 1  . glucose blood (FREESTYLE LITE) test strip Use to monitor your blood sugars 4 times per day; E11.9 (Patient taking differently: 1 each by Other route daily. E11.9) 400 each 12  . Insulin Lispro Prot & Lispro (HUMALOG MIX 75/25 KWIKPEN) (75-25) 100 UNIT/ML Kwikpen Inject 38 Units into the skin daily with breakfast. And pen needles 1/day 45 mL 3  . Lancets (FREESTYLE) lancets Use to monitor blood sugars 4 times per day, once before each meal and once at bedtime (Patient taking differently: 1 each by Other route daily. E11.9) 400 each 12  . loratadine  (CLARITIN) 10 MG tablet Take 1 tablet (10 mg total) by mouth daily. 90 tablet 3  . losartan (COZAAR) 50 MG tablet Take 1 tablet (50 mg total) by mouth daily. 90 tablet 1  . memantine (NAMENDA) 10 MG tablet Take 1 tablet (10 mg total) by mouth 2 (two) times daily. 180 tablet 3  . Multiple Vitamins-Minerals (CENTRUM SILVER PO) Take 1 tablet by mouth daily.    Marland Kitchen omeprazole (PRILOSEC) 20 MG capsule Take 1 capsule (20 mg total) by mouth daily. 90 capsule 3  . Polyethyl Glycol-Propyl Glycol 0.4-0.3 % SOLN Apply 1 drop to eye daily as needed (for dry eyes).    . potassium chloride (KLOR-CON) 10 MEQ tablet Take 1 tablet (10 mEq total) by mouth daily. 7 tablet 0  . QUEtiapine (SEROQUEL) 25 MG tablet Take 1 tablet (25 mg total) by mouth at bedtime. 90 tablet 3  . SURE COMFORT PEN NEEDLES 31G X 5 MM MISC Use to inject insulin 1 time per day. 100 each 2   No current facility-administered medications on file prior to visit.    Allergies  Allergen Reactions  . Dust Mite Mixed Allergen Ext [Mite (D. Farinae)]     Family History  Problem Relation Age of Onset  . Healthy Brother   . Healthy Son   . Healthy Son   . Other Mother        unsure of history - "old age"  . Other Father        unsure of history - "old age"  . Heart attack Neg Hx   . Colon cancer Neg Hx   . Breast cancer Neg Hx   . Stroke Neg Hx     BP 138/78 (BP Location: Right Arm, Patient Position: Sitting, Cuff Size: Large)   Pulse 97   Ht 5' 4.5" (1.638 m)   Wt 215 lb 3.2 oz (97.6 kg)   SpO2 98%   BMI 36.37 kg/m    Review of Systems He denies pt has hypoglycemia.  Pt reports cold intolerance of the feet, and numbness there.      Objective:   Physical Exam VITAL SIGNS:  See vs page.  GENERAL: no distress.   Pulses: dorsalis pedis intact bilat.   MSK: no deformity of the feet.   CV: trace bilat leg edema.  Skin:  no ulcer on the feet.  normal color and temp on the feet.  Neuro: sensation is intact to touch on the feet,  but decreased from normal.  Lab Results  Component Value Date   HGBA1C 7.6 (A) 03/26/2020       Assessment & Plan:  Insulin-requiring type 2 DM, with CRI: this is the best control this pt should aim for, given this regimen, which does match insulin to her changing needs throughout the day.  Foot symptoms, new, uncertain etiology and prognosis.   Patient Instructions  Please continue the same medications. Let's check the circulation.  you will receive a phone call, about a day and time for an appointment. Please come back for a follow-up appointment in 3 months.  check your blood sugar twice a day.  vary the time of day when you check, between before the 3 meals, and at bedtime.  also check if you have symptoms of your blood sugar being too high or too low.  please keep a record of the readings and bring it to your next appointment here (or you can bring the meter itself).  You can write it on any piece of paper.  please call us sooner if your blood sugar goes below 70, or if you have a lot of readings over 200.

## 2020-03-26 NOTE — Patient Instructions (Addendum)
Please continue the same medications. Let's check the circulation.  you will receive a phone call, about a day and time for an appointment. Please come back for a follow-up appointment in 3 months.  check your blood sugar twice a day.  vary the time of day when you check, between before the 3 meals, and at bedtime.  also check if you have symptoms of your blood sugar being too high or too low.  please keep a record of the readings and bring it to your next appointment here (or you can bring the meter itself).  You can write it on any piece of paper.  please call us sooner if your blood sugar goes below 70, or if you have a lot of readings over 200.

## 2020-04-17 ENCOUNTER — Ambulatory Visit: Payer: Medicare Other

## 2020-04-17 ENCOUNTER — Other Ambulatory Visit: Payer: Self-pay

## 2020-04-17 DIAGNOSIS — R0989 Other specified symptoms and signs involving the circulatory and respiratory systems: Secondary | ICD-10-CM | POA: Diagnosis not present

## 2020-04-17 DIAGNOSIS — R200 Anesthesia of skin: Secondary | ICD-10-CM

## 2020-04-21 ENCOUNTER — Other Ambulatory Visit: Payer: Self-pay | Admitting: Endocrinology

## 2020-04-21 ENCOUNTER — Telehealth: Payer: Self-pay | Admitting: Endocrinology

## 2020-04-21 DIAGNOSIS — I739 Peripheral vascular disease, unspecified: Secondary | ICD-10-CM | POA: Insufficient documentation

## 2020-04-21 NOTE — Telephone Encounter (Signed)
Spoke with [name](pt's wife) to let him know the conversation that his wife and I had and he understands and agrees with the plan to go forth with and it has been routed to [place].

## 2020-04-21 NOTE — Telephone Encounter (Signed)
Pt's husband [name] called to try and figure out what we called and spoke to his wife about earlier today. I don't see anything in her file regarding someone reaching out to her, but her husband states he handed the phone to her when they asked for her thinking it was one of her friends. Pt has dementia and doesn't know what the conversation was about. Pt's husband asks if we could please give him a call to let him know what was talked about at [phone] and from now on when we call to disclose the health information to him because she will not remember.

## 2020-04-22 ENCOUNTER — Other Ambulatory Visit: Payer: Self-pay | Admitting: Internal Medicine

## 2020-05-13 ENCOUNTER — Encounter: Payer: Self-pay | Admitting: Vascular Surgery

## 2020-05-13 ENCOUNTER — Ambulatory Visit (INDEPENDENT_AMBULATORY_CARE_PROVIDER_SITE_OTHER): Payer: Medicare Other | Admitting: Vascular Surgery

## 2020-05-13 ENCOUNTER — Other Ambulatory Visit: Payer: Self-pay

## 2020-05-13 VITALS — BP 141/80 | HR 86 | Temp 97.6°F | Resp 16 | Ht 63.0 in | Wt 213.0 lb

## 2020-05-13 DIAGNOSIS — I739 Peripheral vascular disease, unspecified: Secondary | ICD-10-CM | POA: Diagnosis not present

## 2020-05-13 NOTE — Progress Notes (Signed)
Patient name: Angel Green MRN: [id number] DOB: [birth year] Sex: female  REASON FOR CONSULT: Evaluate for PAD  HPI: Angel Green is a 80 y.o. female, with dementia, diabetes, hypertension, hyperlipidemia that presents for evaluation of PAD.  Patient is here with her husband who states that his wife's feet stay cold all the time.  This has been ongoing for a number of years.  She has no pain in her feet.  No pain at night.  No pain in the calf or thigh when she walks.  No active wounds.  She did get ABI studies on 04/17/2020 and was told they were abnormal.  The ABI was 0.89 in the right and 0.98 on the left.  No previous vascular interventions.  Past Medical History:  Diagnosis Date  . Allergy    Dust Mites  . Ankle pain    chronic  . Diabetes mellitus, type 2 ([place])   . Hyperlipidemia   . Hypertension   . Memory loss   . Osteoarthritis   . RENAL INSUFFICIENCY, CHRONIC 04/16/2010  . Rhinitis    allergic nos    Past Surgical History:  Procedure Laterality Date  . CATARACT EXTRACTION    . CHOLECYSTECTOMY    . dental implants    . PARTIAL HYSTERECTOMY     in her 22s    Family History  Problem Relation Age of Onset  . Healthy Brother   . Healthy Son   . Healthy Son   . Other Mother        unsure of history - "old age"  . Other Father        unsure of history - "old age"  . Heart attack Neg Hx   . Colon cancer Neg Hx   . Breast cancer Neg Hx   . Stroke Neg Hx     SOCIAL HISTORY: Social History   Socioeconomic History  . Marital status: Married    Spouse name: Not on file  . Number of children: 2  . Years of education: some college  . Highest education level: Not on file  Occupational History  . Occupation:  retired  Tobacco Use  . Smoking status: Never Smoker  . Smokeless tobacco: Never Used  Vaping Use  . Vaping Use: Never used  Substance and Sexual Activity  . Alcohol use: No    Alcohol/week: 0.0 standard drinks  . Drug use: No  . Sexual activity:  Yes    Partners: Male  Other Topics Concern  . Not on file  Social History Narrative   Husband is a Company secretary.   Right-handed.   1 cup caffeine daily.   Lives at home with husband.          Social Determinants of Health   Financial Resource Strain: Not on file  Food Insecurity: Not on file  Transportation Needs: Not on file  Physical Activity: Not on file  Stress: Not on file  Social Connections: Not on file  Intimate Partner Violence: Not on file    Allergies  Allergen Reactions  . Dust Mite Mixed Allergen Ext [Mite (D. Farinae)]     Current Outpatient Medications  Medication Sig Dispense Refill  . amLODipine (NORVASC) 5 MG tablet Take 1 tablet (5 mg total) by mouth daily. 90 tablet 1  . Apoaequorin 20 MG CAPS Take 20 mg by mouth daily.    Marland Kitchen aspirin 81 MG tablet Take 81 mg by mouth daily.    Marland Kitchen atorvastatin (LIPITOR) 10 MG tablet Take  1 tablet (10 mg total) by mouth daily. 90 tablet 1  . Blood Glucose Monitoring Suppl (FREESTYLE LITE) DEVI Use to monitor your blood sugars (Patient taking differently: 1 each by Other route daily. E11.9) 1 each 0  . Cyanocobalamin (B-12 PO) Take 1 tablet by mouth daily. Takes 5000 mcg by mouth daily.    Marland Kitchen donepezil (ARICEPT) 10 MG tablet Take 1 tablet (10 mg total) by mouth at bedtime. 90 tablet 3  . Dulaglutide (TRULICITY) 4.5 CZ/6.6AY SOPN Inject 4.5 mg into the skin once a week. 12 pen 3  . EPINEPHrine 0.3 mg/0.3 mL IJ SOAJ injection Inject 0.3 mg into the muscle once. Reported on 06/30/2015    . fluticasone (FLOVENT HFA) 44 MCG/ACT inhaler Inhale 2 puffs into the lungs 2 (two) times daily. 3 Inhaler 3  . furosemide (LASIX) 20 MG tablet Take 2 tablets (40 mg total) by mouth daily. 180 tablet 1  . glucose blood (FREESTYLE LITE) test strip Use to monitor your blood sugars 4 times per day; E11.9 (Patient taking differently: 1 each by Other route daily. E11.9) 400 each 12  . Insulin Lispro Prot & Lispro (HUMALOG MIX 75/25 KWIKPEN) (75-25) 100  UNIT/ML Kwikpen Inject 38 Units into the skin daily with breakfast. And pen needles 1/day 45 mL 3  . Lancets (FREESTYLE) lancets Use to monitor blood sugars 4 times per day, once before each meal and once at bedtime (Patient taking differently: 1 each by Other route daily. E11.9) 400 each 12  . loratadine (CLARITIN) 10 MG tablet Take 1 tablet (10 mg total) by mouth daily. 90 tablet 3  . losartan (COZAAR) 50 MG tablet Take 1 tablet (50 mg total) by mouth daily. 90 tablet 1  . memantine (NAMENDA) 10 MG tablet Take 1 tablet (10 mg total) by mouth 2 (two) times daily. 180 tablet 3  . Multiple Vitamins-Minerals (CENTRUM SILVER PO) Take 1 tablet by mouth daily.    Marland Kitchen omeprazole (PRILOSEC) 20 MG capsule Take 1 capsule (20 mg total) by mouth daily. 90 capsule 3  . Polyethyl Glycol-Propyl Glycol 0.4-0.3 % SOLN Apply 1 drop to eye daily as needed (for dry eyes).    . potassium chloride (KLOR-CON) 10 MEQ tablet Take 1 tablet (10 mEq total) by mouth daily. 7 tablet 0  . QUEtiapine (SEROQUEL) 25 MG tablet Take 1 tablet (25 mg total) by mouth at bedtime. 90 tablet 3  . SURE COMFORT PEN NEEDLES 31G X 5 MM MISC Use to inject insulin 1 time per day. 100 each 2   No current facility-administered medications for this visit.    REVIEW OF SYSTEMS:  [X]  denotes positive finding, [ ]  denotes negative finding Cardiac  Comments:  Chest pain or chest pressure:    Shortness of breath upon exertion:    Short of breath when lying flat:    Irregular heart rhythm:        Vascular    Pain in calf, thigh, or hip brought on by ambulation:    Pain in feet at night that wakes you up from your sleep:     Blood clot in your veins:    Leg swelling:         Pulmonary    Oxygen at home:    Productive cough:     Wheezing:         Neurologic    Sudden weakness in arms or legs:     Sudden numbness in arms or legs:     Sudden onset  of difficulty speaking or slurred speech:    Temporary loss of vision in one eye:      Problems with dizziness:         Gastrointestinal    Blood in stool:     Vomited blood:         Genitourinary    Burning when urinating:     Blood in urine:        Psychiatric    Major depression:         Hematologic    Bleeding problems:    Problems with blood clotting too easily:        Skin    Rashes or ulcers:        Constitutional    Fever or chills:      PHYSICAL EXAM: Vitals:   05/13/20 1504  BP: (!) 141/80  Pulse: 86  Resp: 16  Temp: 97.6 F (36.4 C)  TempSrc: Temporal  SpO2: 96%  Weight: 213 lb (96.6 kg)  Height: 5\' 3"  (1.6 m)    GENERAL: The patient is a well-nourished female, in no acute distress. The vital signs are documented above. CARDIAC: There is a regular rate and rhythm.  VASCULAR:  2+ palpable femoral pulses bilaterally 2+ palpable PT pulses bilaterally No lower extremity tissue loss. PULMONARY: No respiratory distress. ABDOMEN: Soft and non-tender. MUSCULOSKELETAL: There are no major deformities or cyanosis. NEUROLOGIC: No focal weakness or paresthesias are detected. SKIN: There are no ulcers or rashes noted. PSYCHIATRIC: The patient has a normal affect.  DATA:   OSH ABIs were 0.89 on the right and 0.98 on the left  Assessment/Plan:  80 year old female presents for evaluation of PAD given report of her feet feeling cold at times with a mildly abnormal ABI of 0.89 on the right.  I discussed with her and her husband in detail that she has easily palpable dorsalis pedis pulses on exam and I do not think she needs any surgical intervention from my standpoint.  I would classify the perfusion of her lower extremities as near normal.  She is on aspirin for risk reduction.  She can follow-up with me as needed if questions or concerns arise in the future.   Marty Heck, MD [facility] Office: [phone]

## 2020-05-24 ENCOUNTER — Other Ambulatory Visit: Payer: Self-pay | Admitting: Internal Medicine

## 2020-05-25 ENCOUNTER — Other Ambulatory Visit: Payer: Self-pay | Admitting: Internal Medicine

## 2020-06-16 ENCOUNTER — Telehealth: Payer: Self-pay | Admitting: Internal Medicine

## 2020-06-16 MED ORDER — FUROSEMIDE 20 MG PO TABS: 40.0000 mg | ORAL_TABLET | Freq: Every day | ORAL | 1 refills | Status: DC

## 2020-06-16 NOTE — Telephone Encounter (Signed)
Rx sent 

## 2020-06-16 NOTE — Telephone Encounter (Signed)
Medication: furosemide (LASIX) 20 MG tablet [[id number]     Has the patient contacted their pharmacy? no (If no, request that the patient contact the pharmacy for the refill.) (If yes, when and what did the pharmacy advise?)    Preferred Pharmacy (with phone number or street name):  [facility] [place]  [address] [place] [address]  Phone:  [phone] Fax:  [phone]     Agent: Please be advised that RX refills may take up to 3 business days. We ask that you follow-up with your pharmacy.

## 2020-06-17 ENCOUNTER — Other Ambulatory Visit: Payer: Self-pay

## 2020-06-17 ENCOUNTER — Ambulatory Visit (INDEPENDENT_AMBULATORY_CARE_PROVIDER_SITE_OTHER): Payer: Medicare Other | Admitting: Internal Medicine

## 2020-06-17 VITALS — BP 127/84 | HR 84 | Temp 97.0°F | Ht 63.0 in | Wt 210.4 lb

## 2020-06-17 DIAGNOSIS — F0390 Unspecified dementia without behavioral disturbance: Secondary | ICD-10-CM

## 2020-06-17 DIAGNOSIS — E785 Hyperlipidemia, unspecified: Secondary | ICD-10-CM | POA: Diagnosis not present

## 2020-06-17 DIAGNOSIS — E876 Hypokalemia: Secondary | ICD-10-CM

## 2020-06-17 DIAGNOSIS — I10 Essential (primary) hypertension: Secondary | ICD-10-CM | POA: Diagnosis not present

## 2020-06-17 LAB — LIPID PANEL
Cholesterol: 134 mg/dL (ref 0–200)
HDL: 49.5 mg/dL (ref >39.00)
LDL Cholesterol: 62 mg/dL (ref 0–99)
NonHDL: 84.88
Total CHOL/HDL Ratio: 3
Triglycerides: 114 mg/dL (ref 0.0–149.0)
VLDL: 22.8 mg/dL (ref 0.0–40.0)

## 2020-06-17 LAB — BASIC METABOLIC PANEL
BUN: 10 mg/dL (ref 6–23)
CO2: 32 mEq/L (ref 19–32)
Calcium: 9.6 mg/dL (ref 8.4–10.5)
Chloride: 101 mEq/L (ref 96–112)
Creatinine, Ser: 1.62 mg/dL — ABNORMAL HIGH (ref 0.40–1.20)
GFR: 29.46 mL/min — ABNORMAL LOW (ref >60.00)
Glucose, Bld: 178 mg/dL — ABNORMAL HIGH (ref 70–99)
Potassium: 3.3 mEq/L — ABNORMAL LOW (ref 3.5–5.1)
Sodium: 141 mEq/L (ref 135–145)

## 2020-06-17 NOTE — Progress Notes (Signed)
Subjective:    Patient ID: Angel Green, female    DOB: December 08, 1940, 80 y.o.   MRN: [id number]  DOS:  06/17/2020 Type of visit - description: Follow-up, here with her husband  Since the last office visit things are about the same. Dementia: No wandering, sleeps okay, no behavioral issues No recent ambulatory BPs Immunizations reviewed.   Review of Systems See above   Past Medical History:  Diagnosis Date  . Allergy    Dust Mites  . Ankle pain    chronic  . Diabetes mellitus, type 2 ([place])   . Hyperlipidemia   . Hypertension   . Memory loss   . Osteoarthritis   . RENAL INSUFFICIENCY, CHRONIC 04/16/2010  . Rhinitis    allergic nos    Past Surgical History:  Procedure Laterality Date  . CATARACT EXTRACTION    . CHOLECYSTECTOMY    . dental implants    . PARTIAL HYSTERECTOMY     in her 30s    Allergies as of 06/17/2020      Reactions   Dust Mite Mixed Allergen Ext [mite (d. Farinae)]       Medication List       Accurate as of June 17, 2020 10:29 AM. If you have any questions, ask your nurse or doctor.        amLODipine 5 MG tablet Commonly known as: NORVASC Take 1 tablet (5 mg total) by mouth daily.   Apoaequorin 20 MG Caps Take 20 mg by mouth daily.   aspirin 81 MG tablet Take 81 mg by mouth daily.   atorvastatin 10 MG tablet Commonly known as: LIPITOR Take 1 tablet (10 mg total) by mouth daily.   B-12 PO Take 1 tablet by mouth daily. Takes 5000 mcg by mouth daily.   CENTRUM SILVER PO Take 1 tablet by mouth daily.   donepezil 10 MG tablet Commonly known as: ARICEPT Take 1 tablet (10 mg total) by mouth at bedtime.   EPINEPHrine 0.3 mg/0.3 mL Soaj injection Commonly known as: EPI-PEN Inject 0.3 mg into the muscle once. Reported on 06/30/2015   fluticasone 44 MCG/ACT inhaler Commonly known as: Flovent HFA Inhale 2 puffs into the lungs 2 (two) times daily.   freestyle lancets Use to monitor blood sugars 4 times per day, once before each  meal and once at bedtime What changed:   how much to take  how to take this  when to take this  additional instructions   FreeStyle Lite Devi Use to monitor your blood sugars What changed:   how much to take  how to take this  when to take this  additional instructions   FREESTYLE LITE test strip Generic drug: glucose blood Use to monitor your blood sugars 4 times per day; E11.9 What changed:   how much to take  how to take this  when to take this  additional instructions   furosemide 20 MG tablet Commonly known as: LASIX Take 2 tablets (40 mg total) by mouth daily.   Insulin Lispro Prot & Lispro (75-25) 100 UNIT/ML Kwikpen Commonly known as: HumaLOG Mix 75/25 KwikPen Inject 38 Units into the skin daily with breakfast. And pen needles 1/day   loratadine 10 MG tablet Commonly known as: CLARITIN Take 1 tablet (10 mg total) by mouth daily.   losartan 50 MG tablet Commonly known as: COZAAR Take 1 tablet (50 mg total) by mouth daily.   memantine 10 MG tablet Commonly known as: NAMENDA Take 1 tablet (10 mg  total) by mouth 2 (two) times daily.   omeprazole 20 MG capsule Commonly known as: PRILOSEC Take 1 capsule (20 mg total) by mouth daily.   Polyethyl Glycol-Propyl Glycol 0.4-0.3 % Soln Apply 1 drop to eye daily as needed (for dry eyes).   potassium chloride 10 MEQ tablet Commonly known as: KLOR-CON Take 1 tablet (10 mEq total) by mouth daily.   QUEtiapine 25 MG tablet Commonly known as: SEROquel Take 1 tablet (25 mg total) by mouth at bedtime.   Sure Comfort Pen Needles 31G X 5 MM Misc Generic drug: Insulin Pen Needle Use to inject insulin 1 time per day.   Trulicity 4.5 UJ/8.1XB Sopn Generic drug: Dulaglutide Inject 4.5 mg into the skin once a week.          Objective:   Physical Exam BP 127/84 (BP Location: Left Arm, Patient Position: Sitting, Cuff Size: Large)   Pulse 84   Temp (!) 97 F (36.1 C) (Temporal)   Ht 5\' 3"  (1.6 m)    Wt 210 lb 6.4 oz (95.4 kg)   SpO2 98%   BMI 37.27 kg/m  General:   Well developed, NAD, BMI noted. HEENT:  Normocephalic . Face symmetric, atraumatic Lungs:  CTA B Normal respiratory effort, no intercostal retractions, no accessory muscle use. Heart: RRR,  no murmur.  Lower extremities: no pretibial edema bilaterally  Skin: Not pale. Not jaundice Neurologic:  alert & Pleasant, cooperative, MMSE: 9.  Speech normal, gait appropriate for age and unassisted Psych--  Behavior appropriate. No anxious or depressed appearing.      Assessment       Assessment   DM  Dr Loanne Drilling + Neuropathy: Per foot exam 12-2014 HTN ---change ACE to ARB is 03/2014 Hyperlipidemia CRI  dx 2012  Sees Dr Arty Baumgartner  Morbid obesity DJD, had  chronic ankle pain Allergies -- dust mites , occ uses a inhaler , sees allergist, has shots q weeks started ~ 08-2014 Dementia:  MMSE 7/218: 24, rx Aricept.  B12, RPR, sed rate and TSH normal MMSE 07/2017 :13 worse MRI brain 6/19:  prominent right temporal lobe volume loss.  PLAN DM: Per Endo HTN: No recent ambulatory BPs, blood pressure today is very good.Continue Lasix, losartan, potassium, Lasix RF sent.  Check BMP High cholesterol: On atorvastatin.  Check FLP PAD? Saw vascular surgery 05/13/2020, note reviewed, concerned about PAD, examination was negative, no evidence of PAD. Dementia: MMSE  today decreased to 9.  She is very well taken care of by her husband, very pleasant and cooperative.  No behavioral issues.  Continue present care. Preventive care: Advance directives discussed RTC 6 months    This visit occurred during the SARS-CoV-2 public health emergency.  Safety protocols were in place, including screening questions prior to the visit, additional usage of staff PPE, and extensive cleaning of exam room while observing appropriate contact time as indicated for disinfecting solutions.

## 2020-06-17 NOTE — Patient Instructions (Addendum)
   GO TO THE LAB : Get the blood work     GO TO THE FRONT DESK, PLEASE SCHEDULE YOUR APPOINTMENTS Come back for  a check up in 6 months    "Living will", "Health Care Power of attorney": Advanced care planning  (If you already have a living will or healthcare power of attorney, please bring the copy to be scanned in your chart.)  Advance care planning is a process that supports adults in  understanding and sharing their preferences regarding future medical care.   The patient's preferences are recorded in documents called Advance Directives.    Advanced directives are completed (and can be modified at any time) while the patient is in full mental capacity.   The documentation should be available at all times to the patient, the family and the healthcare providers.  Bring in a copy to be scanned in your chart is an excellent idea and is recommended   This legal documents direct treatment decision making and/or appoint a surrogate to make the decision if the patient is not capable to do so.    Advance directives can be documented in many types of formats,  documents have names such as:  Lliving will  Durable power of attorney for healthcare (healthcare proxy or healthcare power of attorney)  Combined directives  Physician orders for life-sustaining treatment    More information at:  [email or web address]  

## 2020-06-18 NOTE — Assessment & Plan Note (Signed)
DM: Per Endo HTN: No recent ambulatory BPs, blood pressure today is very good.Continue Lasix, losartan, potassium, Lasix RF sent.  Check BMP High cholesterol: On atorvastatin.  Check FLP PAD? Saw vascular surgery 05/13/2020, note reviewed, concerned about PAD, examination was negative, no evidence of PAD. Dementia: MMSE  today decreased to 9.  She is very well taken care of by her husband, very pleasant and cooperative.  No behavioral issues.  Continue present care. Preventive care: Advance directives discussed RTC 6 months

## 2020-06-23 MED ORDER — POTASSIUM CHLORIDE ER 10 MEQ PO TBCR: 10.0000 meq | EXTENDED_RELEASE_TABLET | Freq: Every day | ORAL | 1 refills | Status: DC

## 2020-06-23 MED ORDER — POTASSIUM CHLORIDE CRYS ER 10 MEQ PO TBCR: 10.0000 meq | EXTENDED_RELEASE_TABLET | Freq: Two times a day (BID) | ORAL | 0 refills | Status: DC

## 2020-06-23 NOTE — Addendum Note (Signed)
Addended byDamita Dunnings D on: 06/23/2020 10:09 AM   Modules accepted: Orders

## 2020-06-26 ENCOUNTER — Ambulatory Visit (INDEPENDENT_AMBULATORY_CARE_PROVIDER_SITE_OTHER): Payer: Medicare Other | Admitting: Endocrinology

## 2020-06-26 ENCOUNTER — Other Ambulatory Visit: Payer: Self-pay

## 2020-06-26 VITALS — BP 120/70 | HR 95 | Ht 63.0 in | Wt 210.0 lb

## 2020-06-26 DIAGNOSIS — I739 Peripheral vascular disease, unspecified: Secondary | ICD-10-CM | POA: Diagnosis not present

## 2020-06-26 DIAGNOSIS — E119 Type 2 diabetes mellitus without complications: Secondary | ICD-10-CM

## 2020-06-26 LAB — POCT GLYCOSYLATED HEMOGLOBIN (HGB A1C): Hemoglobin A1C: 7 % — AB (ref 4.0–5.6)

## 2020-06-26 NOTE — Progress Notes (Signed)
Subjective:    Patient ID: Angel Green, female    DOB: [birth year], 80 y.o.   MRN: [id number]  HPI Pt returns for f/u of diabetes mellitus: DM type: Insulin-requiring type 2.   Dx'ed: 4259 Complications: PN, PAD, and CRI.   Therapy: insulin since 5638, and Trulicity.   GDM: never.  DKA: never.   Severe hypoglycemia: once, in 2019.   Pancreatitis: never.  SDOH: rx is limited by memory loss; husband sets pen to # of units, and pt administers. Other: she changed to QAM premixed insulin, after poor results with multiple daily injections; pattern of cbg's indicates she does not need a PM dose.   Interval history: no cbg record, but husband states cbg's are still approx 200.  No new sxs.  husb says he never misses giving pt meds.  Pt c/o coolness of the feet.    Past Medical History:  Diagnosis Date  . Allergy    Dust Mites  . Ankle pain    chronic  . Diabetes mellitus, type 2 ([place])   . Hyperlipidemia   . Hypertension   . Memory loss   . Osteoarthritis   . RENAL INSUFFICIENCY, CHRONIC 04/16/2010  . Rhinitis    allergic nos    Past Surgical History:  Procedure Laterality Date  . CATARACT EXTRACTION    . CHOLECYSTECTOMY    . dental implants    . PARTIAL HYSTERECTOMY     in her 19s    Social History   Socioeconomic History  . Marital status: Married    Spouse name: Not on file  . Number of children: 2  . Years of education: some college  . Highest education level: Not on file  Occupational History  . Occupation:  retired  Tobacco Use  . Smoking status: Never Smoker  . Smokeless tobacco: Never Used  Vaping Use  . Vaping Use: Never used  Substance and Sexual Activity  . Alcohol use: No    Alcohol/week: 0.0 standard drinks  . Drug use: No  . Sexual activity: Yes    Partners: Male  Other Topics Concern  . Not on file  Social History Narrative   Husband is a Company secretary.   Right-handed.   1 cup caffeine daily.   Lives at home with husband.          Social  Determinants of Health   Financial Resource Strain: Not on file  Food Insecurity: Not on file  Transportation Needs: Not on file  Physical Activity: Not on file  Stress: Not on file  Social Connections: Not on file  Intimate Partner Violence: Not on file    Current Outpatient Medications on File Prior to Visit  Medication Sig Dispense Refill  . amLODipine (NORVASC) 5 MG tablet Take 1 tablet (5 mg total) by mouth daily. 90 tablet 1  . Apoaequorin 20 MG CAPS Take 20 mg by mouth daily.    Marland Kitchen aspirin 81 MG tablet Take 81 mg by mouth daily.    Marland Kitchen atorvastatin (LIPITOR) 10 MG tablet Take 1 tablet (10 mg total) by mouth daily. 90 tablet 1  . Blood Glucose Monitoring Suppl (FREESTYLE LITE) DEVI Use to monitor your blood sugars (Patient taking differently: 1 each by Other route daily. E11.9) 1 each 0  . Cyanocobalamin (B-12 PO) Take 1 tablet by mouth daily. Takes 5000 mcg by mouth daily.    Marland Kitchen donepezil (ARICEPT) 10 MG tablet Take 1 tablet (10 mg total) by mouth at bedtime. 90 tablet 3  .  Dulaglutide (TRULICITY) 4.5 QQ/7.6PP SOPN Inject 4.5 mg into the skin once a week. 12 pen 3  . EPINEPHrine 0.3 mg/0.3 mL IJ SOAJ injection Inject 0.3 mg into the muscle once. Reported on 06/30/2015    . fluticasone (FLOVENT HFA) 44 MCG/ACT inhaler Inhale 2 puffs into the lungs 2 (two) times daily. 3 Inhaler 3  . furosemide (LASIX) 20 MG tablet Take 2 tablets (40 mg total) by mouth daily. 180 tablet 1  . glucose blood (FREESTYLE LITE) test strip Use to monitor your blood sugars 4 times per day; E11.9 (Patient taking differently: 1 each by Other route daily. E11.9) 400 each 12  . Insulin Lispro Prot & Lispro (HUMALOG MIX 75/25 KWIKPEN) (75-25) 100 UNIT/ML Kwikpen Inject 38 Units into the skin daily with breakfast. And pen needles 1/day 45 mL 3  . Lancets (FREESTYLE) lancets Use to monitor blood sugars 4 times per day, once before each meal and once at bedtime (Patient taking differently: 1 each by Other route daily.  E11.9) 400 each 12  . loratadine (CLARITIN) 10 MG tablet Take 1 tablet (10 mg total) by mouth daily. 90 tablet 3  . losartan (COZAAR) 50 MG tablet Take 1 tablet (50 mg total) by mouth daily. 90 tablet 1  . memantine (NAMENDA) 10 MG tablet Take 1 tablet (10 mg total) by mouth 2 (two) times daily. 180 tablet 3  . Multiple Vitamins-Minerals (CENTRUM SILVER PO) Take 1 tablet by mouth daily.    Marland Kitchen omeprazole (PRILOSEC) 20 MG capsule Take 1 capsule (20 mg total) by mouth daily. 90 capsule 3  . Polyethyl Glycol-Propyl Glycol 0.4-0.3 % SOLN Apply 1 drop to eye daily as needed (for dry eyes).    . potassium chloride (KLOR-CON) 10 MEQ tablet Take 1 tablet (10 mEq total) by mouth daily. 90 tablet 1  . potassium chloride (KLOR-CON) 10 MEQ tablet Take 1 tablet (10 mEq total) by mouth 2 (two) times daily. 15 tablet 0  . QUEtiapine (SEROQUEL) 25 MG tablet Take 1 tablet (25 mg total) by mouth at bedtime. 90 tablet 3  . SURE COMFORT PEN NEEDLES 31G X 5 MM MISC Use to inject insulin 1 time per day. 100 each 2   No current facility-administered medications on file prior to visit.    Allergies  Allergen Reactions  . Dust Mite Mixed Allergen Ext [Mite (D. Farinae)]     Family History  Problem Relation Age of Onset  . Healthy Brother   . Healthy Son   . Healthy Son   . Other Mother        unsure of history - "old age"  . Other Father        unsure of history - "old age"  . Heart attack Neg Hx   . Colon cancer Neg Hx   . Breast cancer Neg Hx   . Stroke Neg Hx     BP 120/70 (BP Location: Right Arm, Patient Position: Sitting, Cuff Size: Large)   Pulse 95   Ht 5\' 3"  (1.6 m)   Wt 210 lb (95.3 kg)   SpO2 97%   BMI 37.20 kg/m    Review of Systems He denies hypoglycemia    Objective:   Physical Exam VITAL SIGNS:  See vs page GENERAL: no distress Pulses: dorsalis pedis intact bilat.   MSK: no deformity of the feet CV: trace bilat leg edema Skin:  no ulcer on the feet.  normal color and temp on  the feet. Neuro: sensation is intact to  touch on the feet  Lab Results  Component Value Date   HGBA1C 7.6 (A) 03/26/2020   Lab Results  Component Value Date   CREATININE 1.62 (H) 06/17/2020   BUN 10 06/17/2020   NA 141 06/17/2020   K 3.3 (L) 06/17/2020   CL 101 06/17/2020   CO2 32 06/17/2020   Lab Results  Component Value Date   TSH 1.680 01/31/2020       Assessment & Plan:  Insulin-requiring type 2 DM CRI: check fructosamine PAD.  We discussed.    Patient Instructions  Please continue the same medications. A different type of diabetes blood test is requested for you today.  We'll let you know about the results.  Please see a circulation specialist.  you will receive a phone call, about a day and time for an appointment Please come back for a follow-up appointment in 4 months.  check your blood sugar twice a day.  vary the time of day when you check, between before the 3 meals, and at bedtime.  also check if you have symptoms of your blood sugar being too high or too low.  please keep a record of the readings and bring it to your next appointment here (or you can bring the meter itself).  You can write it on any piece of paper.  please call us sooner if your blood sugar goes below 70, or if you have a lot of readings over 200.

## 2020-06-26 NOTE — Patient Instructions (Addendum)
Please continue the same medications. A different type of diabetes blood test is requested for you today.  We'll let you know about the results.  Please see a circulation specialist.  you will receive a phone call, about a day and time for an appointment Please come back for a follow-up appointment in 4 months.  check your blood sugar twice a day.  vary the time of day when you check, between before the 3 meals, and at bedtime.  also check if you have symptoms of your blood sugar being too high or too low.  please keep a record of the readings and bring it to your next appointment here (or you can bring the meter itself).  You can write it on any piece of paper.  please call us sooner if your blood sugar goes below 70, or if you have a lot of readings over 200.

## 2020-06-30 LAB — FRUCTOSAMINE: Fructosamine: 299 umol/L — ABNORMAL HIGH (ref 205–285)

## 2020-07-01 ENCOUNTER — Telehealth: Payer: Self-pay

## 2020-07-01 NOTE — Telephone Encounter (Signed)
Unable to reach pt. Phone just kept ringing.

## 2020-07-15 ENCOUNTER — Other Ambulatory Visit: Payer: Self-pay

## 2020-07-15 ENCOUNTER — Encounter: Payer: Self-pay | Admitting: Family

## 2020-07-15 ENCOUNTER — Ambulatory Visit (INDEPENDENT_AMBULATORY_CARE_PROVIDER_SITE_OTHER): Payer: Medicare Other | Admitting: Family

## 2020-07-15 VITALS — BP 120/60 | HR 88 | Temp 98.7°F | Ht 63.0 in | Wt 211.4 lb

## 2020-07-15 DIAGNOSIS — R202 Paresthesia of skin: Secondary | ICD-10-CM | POA: Diagnosis not present

## 2020-07-15 DIAGNOSIS — E876 Hypokalemia: Secondary | ICD-10-CM | POA: Diagnosis not present

## 2020-07-15 DIAGNOSIS — E538 Deficiency of other specified B group vitamins: Secondary | ICD-10-CM

## 2020-07-15 DIAGNOSIS — R200 Anesthesia of skin: Secondary | ICD-10-CM | POA: Diagnosis not present

## 2020-07-15 LAB — COMPREHENSIVE METABOLIC PANEL
ALT: 15 U/L (ref 0–35)
AST: 18 U/L (ref 0–37)
Albumin: 4 g/dL (ref 3.5–5.2)
Alkaline Phosphatase: 70 U/L (ref 39–117)
BUN: 16 mg/dL (ref 6–23)
CO2: 27 mEq/L (ref 19–32)
Calcium: 9.7 mg/dL (ref 8.4–10.5)
Chloride: 101 mEq/L (ref 96–112)
Creatinine, Ser: 1.76 mg/dL — ABNORMAL HIGH (ref 0.40–1.20)
GFR: 26.66 mL/min — ABNORMAL LOW (ref >60.00)
Glucose, Bld: 171 mg/dL — ABNORMAL HIGH (ref 70–99)
Potassium: 3.6 mEq/L (ref 3.5–5.1)
Sodium: 139 mEq/L (ref 135–145)
Total Bilirubin: 0.4 mg/dL (ref 0.2–1.2)
Total Protein: 7.3 g/dL (ref 6.0–8.3)

## 2020-07-15 LAB — MAGNESIUM: Magnesium: 2 mg/dL (ref 1.5–2.5)

## 2020-07-15 LAB — VITAMIN B12: Vitamin B-12: >1550 pg/mL — ABNORMAL HIGH (ref 211–911)

## 2020-07-15 NOTE — Progress Notes (Signed)
Angel Green is a 80 y.o. female with the following history as recorded in EpicCare:  Patient Active Problem List   Diagnosis Date Noted  . PAD (peripheral artery disease) ([place]) 04/21/2020  . Numbness 03/26/2020  . Dementia ([place]) 08/14/2017  . Cognitive impairment 09/22/2016  . Morbid obesity ([place]) 06/30/2015  . Diabetes ([place]) 04/23/2015  . PCP NOTES >>>>> 12/28/2014  . Leg ulcer, left ([place]) 10/12/2013  . Annual physical exam 12/14/2011  . Angioedema 10/22/2011  . Tracheobronchitis, chronic ([place]) 08/20/2010  . Fatigue 05/22/2010  . CKD (chronic kidney disease) 04/16/2010  . GERD 09/30/2008  . EDEMA 06/10/2008  . Hyperlipidemia 01/25/2007  . ANKLE PAIN, CHRONIC 12/08/2006  . Seasonal and perennial allergic rhinitis 10/20/2006  . HTN (hypertension) 08/30/2006  . OSTEOARTHROSIS, GENERALIZED, MULTIPLE SITES 08/30/2006    Current Outpatient Medications  Medication Sig Dispense Refill  . amLODipine (NORVASC) 5 MG tablet Take 1 tablet (5 mg total) by mouth daily. 90 tablet 1  . Apoaequorin 20 MG CAPS Take 20 mg by mouth daily.    Marland Kitchen aspirin 81 MG tablet Take 81 mg by mouth daily.    Marland Kitchen atorvastatin (LIPITOR) 10 MG tablet Take 1 tablet (10 mg total) by mouth daily. 90 tablet 1  . Cyanocobalamin (B-12 PO) Take 1 tablet by mouth daily. Takes 5000 mcg by mouth daily.    Marland Kitchen donepezil (ARICEPT) 10 MG tablet Take 1 tablet (10 mg total) by mouth at bedtime. 90 tablet 3  . Dulaglutide (TRULICITY) 4.5 NG/2.9BM SOPN Inject 4.5 mg into the skin once a week. 12 pen 3  . EPINEPHrine 0.3 mg/0.3 mL IJ SOAJ injection Inject 0.3 mg into the muscle once. Reported on 06/30/2015    . fluticasone (FLOVENT HFA) 44 MCG/ACT inhaler Inhale 2 puffs into the lungs 2 (two) times daily. 3 Inhaler 3  . Insulin Lispro Prot & Lispro (HUMALOG MIX 75/25 KWIKPEN) (75-25) 100 UNIT/ML Kwikpen Inject 38 Units into the skin daily with breakfast. And pen needles 1/day 45 mL 3  . loratadine (CLARITIN) 10 MG tablet Take 1 tablet  (10 mg total) by mouth daily. 90 tablet 3  . losartan (COZAAR) 50 MG tablet Take 1 tablet (50 mg total) by mouth daily. 90 tablet 1  . memantine (NAMENDA) 10 MG tablet Take 1 tablet (10 mg total) by mouth 2 (two) times daily. 180 tablet 3  . Multiple Vitamins-Minerals (CENTRUM SILVER PO) Take 1 tablet by mouth daily.    Marland Kitchen omeprazole (PRILOSEC) 20 MG capsule Take 1 capsule (20 mg total) by mouth daily. 90 capsule 3  . potassium chloride (KLOR-CON) 10 MEQ tablet Take 1 tablet (10 mEq total) by mouth daily. 90 tablet 1  . QUEtiapine (SEROQUEL) 25 MG tablet Take 1 tablet (25 mg total) by mouth at bedtime. 90 tablet 3  . Blood Glucose Monitoring Suppl (FREESTYLE LITE) DEVI Use to monitor your blood sugars (Patient not taking: Reported on 07/15/2020) 1 each 0  . furosemide (LASIX) 20 MG tablet Take 2 tablets (40 mg total) by mouth daily. (Patient not taking: Reported on 07/15/2020) 180 tablet 1  . glucose blood (FREESTYLE LITE) test strip Use to monitor your blood sugars 4 times per day; E11.9 (Patient not taking: Reported on 07/15/2020) 400 each 12  . Lancets (FREESTYLE) lancets Use to monitor blood sugars 4 times per day, once before each meal and once at bedtime (Patient not taking: Reported on 07/15/2020) 400 each 12  . Polyethyl Glycol-Propyl Glycol 0.4-0.3 % SOLN Apply 1 drop to eye  daily as needed (for dry eyes). (Patient not taking: Reported on 07/15/2020)    . potassium chloride (KLOR-CON) 10 MEQ tablet Take 1 tablet (10 mEq total) by mouth 2 (two) times daily. 15 tablet 0  . SURE COMFORT PEN NEEDLES 31G X 5 MM MISC Use to inject insulin 1 time per day. (Patient not taking: Reported on 07/15/2020) 100 each 2   No current facility-administered medications for this visit.    Allergies: Dust mite mixed allergen ext [mite (d. farinae)]  Past Medical History:  Diagnosis Date  . Allergy    Dust Mites  . Ankle pain    chronic  . Diabetes mellitus, type 2 ([place])   . Hyperlipidemia   . Hypertension   .  Memory loss   . Osteoarthritis   . RENAL INSUFFICIENCY, CHRONIC 04/16/2010  . Rhinitis    allergic nos    Past Surgical History:  Procedure Laterality Date  . CATARACT EXTRACTION    . CHOLECYSTECTOMY    . dental implants    . PARTIAL HYSTERECTOMY     in her 38s    Family History  Problem Relation Age of Onset  . Healthy Brother   . Healthy Son   . Healthy Son   . Other Mother        unsure of history - "old age"  . Other Father        unsure of history - "old age"  . Heart attack Neg Hx   . Colon cancer Neg Hx   . Breast cancer Neg Hx   . Stroke Neg Hx     Social History   Tobacco Use  . Smoking status: Never Smoker  . Smokeless tobacco: Never Used  Substance Use Topics  . 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  Head: Normocephalic and atraumatic  Eyes: Sclera and conjunctiva clear; pupils round and reactive to light; extraocular movements intact  Lungs: Respirations unlabored; Musculoskeletal: No deformities; no active joint inflammation  Extremities: No edema, cyanosis, clubbing  Vessels: Symmetric bilaterally  Neurologic: Alert and oriented; speech  intact; face symmetrical; moves all extremities well; CNII-XII intact without focal deficit   Assessment:  1. Hypokalemia   2. Numbness and tingling of both lower extremities   3. Low vitamin B12 level     Plan:  Update labs today; to consider treatment for peripheral neuropathy treatment;   This visit occurred during the SARS-CoV-2 public health emergency.  Safety protocols were in place, including screening questions prior to the visit, additional usage of staff PPE, and extensive cleaning of exam room while observing appropriate contact  time as indicated for disinfecting solutions.     No follow-ups on file.  Orders Placed This Encounter  Procedures  . Magnesium  . Vitamin B12  . Comp Met (CMET)    Requested Prescriptions    No prescriptions requested or ordered in this encounter

## 2020-07-18 ENCOUNTER — Other Ambulatory Visit: Payer: Self-pay | Admitting: Family

## 2020-07-18 MED ORDER — DULOXETINE HCL 20 MG PO CPEP: 20.0000 mg | ORAL_CAPSULE | Freq: Every day | ORAL | 0 refills | Status: DC

## 2020-07-22 ENCOUNTER — Other Ambulatory Visit: Payer: Self-pay | Admitting: Internal Medicine

## 2020-07-22 MED ORDER — DULOXETINE HCL 20 MG PO CPEP: 20.0000 mg | ORAL_CAPSULE | Freq: Every day | ORAL | 0 refills | Status: DC

## 2020-07-31 ENCOUNTER — Ambulatory Visit: Payer: Medicare Other | Admitting: Adult Health

## 2020-08-06 ENCOUNTER — Telehealth: Payer: Self-pay | Admitting: Endocrinology

## 2020-08-06 NOTE — Telephone Encounter (Signed)
Pt needs a refill on   Dulaglutide (TRULICITY) 4.5 AR/0.1TY SOPN as well as Pen needles   [place], [place]

## 2020-08-08 ENCOUNTER — Other Ambulatory Visit: Payer: Self-pay

## 2020-08-08 DIAGNOSIS — E119 Type 2 diabetes mellitus without complications: Secondary | ICD-10-CM

## 2020-08-08 MED ORDER — TRULICITY 4.5 MG/0.5ML ~~LOC~~ SOAJ: 4.5000 mg | SUBCUTANEOUS | 3 refills | Status: DC

## 2020-08-08 NOTE — Telephone Encounter (Signed)
Rx sent 

## 2020-08-26 ENCOUNTER — Ambulatory Visit: Payer: Medicare Other | Admitting: Adult Health

## 2020-08-28 ENCOUNTER — Ambulatory Visit (INDEPENDENT_AMBULATORY_CARE_PROVIDER_SITE_OTHER): Payer: Medicare Other | Admitting: Adult Health

## 2020-08-28 ENCOUNTER — Other Ambulatory Visit: Payer: Self-pay

## 2020-08-28 ENCOUNTER — Encounter: Payer: Self-pay | Admitting: Adult Health

## 2020-08-28 VITALS — BP 147/82 | HR 88 | Ht 65.0 in | Wt 214.0 lb

## 2020-08-28 DIAGNOSIS — F0391 Unspecified dementia with behavioral disturbance: Secondary | ICD-10-CM

## 2020-08-28 NOTE — Progress Notes (Signed)
[facility] [address] [name] [address] [phone]       OFFICE FOLLOW UP NOTE  [name] Date of Birth:  1940-12-07 Medical Record Number:  [id number]   Reason for visit: Dementia    SUBJECTIVE:   CHIEF COMPLAINT:  Chief Complaint  Patient presents with   Follow-up    Rm 3 with spouse james  Pt is well and stable     HPI:   Angel Green is a 80 year old female with underlying medical history of diabetes, chronic renal insufficiency, HTN and dementia.  She was initially evaluated by Dr. Krista Blue on 10/18/2017 for dementia with behavioral disturbance.    Today, 08/28/2020, [name] returns for 50-month follow-up accompanied by her husband regarding dementia.  Reports cognition has been stable since prior visit currently on Aricept, Namenda and Seroquel.  MMSE 10 (completed by PCP 05/2018) with prior 6/30 (02/2020) (6 mo prior 15/30).  No behavioral concerns continue Seroquel and sleeps well throughout the night.  Husband is extremely supportive and is primary caregiver.  No concerns at this time.     ROS:   14 system review of systems performed and negative with exception of memory loss  PMH:  Past Medical History:  Diagnosis Date   Allergy    Dust Mites   Ankle pain    chronic   Diabetes mellitus, type 2 ([place])    Hyperlipidemia    Hypertension    Memory loss    Osteoarthritis    RENAL INSUFFICIENCY, CHRONIC 04/16/2010   Rhinitis    allergic nos    PSH:  Past Surgical History:  Procedure Laterality Date   CATARACT EXTRACTION     CHOLECYSTECTOMY     dental implants     PARTIAL HYSTERECTOMY     in her 32s    Social History:  Social History   Socioeconomic History   Marital status: Married    Spouse name: Not on file   Number of children: 2   Years of education: some college   Highest education level: Not on file  Occupational History   Occupation:  retired  Tobacco Use   Smoking status: Never   Smokeless  tobacco: Never  Vaping Use   Vaping Use: Never used  Substance and Sexual Activity   Alcohol use: No    Alcohol/week: 0.0 standard drinks   Drug use: No   Sexual activity: Yes    Partners: Male  Other Topics Concern   Not on file  Social History Narrative   Husband is a Company secretary.   Right-handed.   1 cup caffeine daily.   Lives at home with husband.          Social Determinants of Health   Financial Resource Strain: Not on file  Food Insecurity: Not on file  Transportation Needs: Not on file  Physical Activity: Not on file  Stress: Not on file  Social Connections: Not on file  Intimate Partner Violence: Not on file    Family History:  Family History  Problem Relation Age of Onset   Healthy Brother    Healthy Son    Healthy Son    Other Mother        unsure of history - "old age"   Other Father        unsure of history - "old age"   Heart attack Neg Hx    Colon cancer Neg Hx    Breast cancer Neg Hx    Stroke Neg  Hx     Medications:   Current Outpatient Medications on File Prior to Visit  Medication Sig Dispense Refill   amLODipine (NORVASC) 5 MG tablet Take 1 tablet (5 mg total) by mouth daily. 90 tablet 1   Apoaequorin 20 MG CAPS Take 20 mg by mouth daily.     aspirin 81 MG tablet Take 81 mg by mouth daily.     atorvastatin (LIPITOR) 10 MG tablet Take 1 tablet (10 mg total) by mouth daily. 90 tablet 1   Blood Glucose Monitoring Suppl (FREESTYLE LITE) DEVI Use to monitor your blood sugars 1 each 0   Cyanocobalamin (B-12 PO) Take 1 tablet by mouth daily. Takes 5000 mcg by mouth daily.     donepezil (ARICEPT) 10 MG tablet Take 1 tablet (10 mg total) by mouth at bedtime. 90 tablet 3   Dulaglutide (TRULICITY) 4.5 JT/7.0VX SOPN Inject 4.5 mg into the skin once a week. 12 mL 3   DULoxetine (CYMBALTA) 20 MG capsule Take 1 capsule (20 mg total) by mouth daily. 30 capsule 0   EPINEPHrine 0.3 mg/0.3 mL IJ SOAJ injection Inject 0.3 mg into the muscle once. Reported on  06/30/2015     fluticasone (FLOVENT HFA) 44 MCG/ACT inhaler Inhale 2 puffs into the lungs 2 (two) times daily. 3 Inhaler 3   furosemide (LASIX) 20 MG tablet Take 2 tablets (40 mg total) by mouth daily. 180 tablet 1   glucose blood (FREESTYLE LITE) test strip Use to monitor your blood sugars 4 times per day; E11.9 400 each 12   Insulin Lispro Prot & Lispro (HUMALOG MIX 75/25 KWIKPEN) (75-25) 100 UNIT/ML Kwikpen Inject 38 Units into the skin daily with breakfast. And pen needles 1/day 45 mL 3   Lancets (FREESTYLE) lancets Use to monitor blood sugars 4 times per day, once before each meal and once at bedtime 400 each 12   loratadine (CLARITIN) 10 MG tablet Take 1 tablet (10 mg total) by mouth daily. 90 tablet 3   losartan (COZAAR) 50 MG tablet Take 1 tablet (50 mg total) by mouth daily. 90 tablet 1   memantine (NAMENDA) 10 MG tablet Take 1 tablet (10 mg total) by mouth 2 (two) times daily. 180 tablet 3   Multiple Vitamins-Minerals (CENTRUM SILVER PO) Take 1 tablet by mouth daily.     omeprazole (PRILOSEC) 20 MG capsule Take 1 capsule (20 mg total) by mouth daily. 90 capsule 3   Polyethyl Glycol-Propyl Glycol 0.4-0.3 % SOLN Apply 1 drop to eye daily as needed (for dry eyes).     potassium chloride (KLOR-CON) 10 MEQ tablet Take 1 tablet (10 mEq total) by mouth daily. 90 tablet 1   potassium chloride (KLOR-CON) 10 MEQ tablet Take 1 tablet (10 mEq total) by mouth 2 (two) times daily. 15 tablet 0   QUEtiapine (SEROQUEL) 25 MG tablet Take 1 tablet (25 mg total) by mouth at bedtime. 90 tablet 3   SURE COMFORT PEN NEEDLES 31G X 5 MM MISC Use to inject insulin 1 time per day. 100 each 2   No current facility-administered medications on file prior to visit.    Allergies:   Allergies  Allergen Reactions   Dust Mite Mixed Allergen Ext [Mite (D. Farinae)]       OBJECTIVE:  Physical Exam  Vitals:   08/28/20 1251  BP: (!) 147/82  Pulse: 88  Weight: 214 lb (97.1 kg)  Height: 5\' 5"  (1.651 m)     Body mass index is 35.61 kg/m. No results  found.  General: well developed, well nourished, very pleasant elderly African-American female, seated, in no evident distress Head: head normocephalic and atraumatic.   Neck: supple with no carotid or supraclavicular bruits Cardiovascular: regular rate and rhythm, no murmurs Musculoskeletal: no deformity Skin:  no rash/petichiae Vascular:  Normal pulses all extremities   Neurologic Exam Mental Status: Awake and fully alert.   Husband provides majority of today's history.  Mood and affect appropriate.   MMSE - Mini Mental State Exam 06/17/2020 01/31/2020 08/09/2019 11/14/2018 04/20/2018 11/11/2017 10/18/2017  Not completed: - - - Unable to complete - - -  Orientation to time 0 0 1 - 3 3 1   Orientation to Place 0 0 3 - 4 5 4   Registration 3 3 3  - 3 3 3   Attention/ Calculation 0 0 0 - 2 2 2   Recall 0 0 0 - 0 3 0  Language- name 2 objects 2 2 2  - 2 2 2   Language- repeat 1 1 1  - 1 1 1   Language- follow 3 step command 3 0 3 - 3 0 3  Language- read & follow direction 1 0 1 - 1 1 1   Write a sentence 0 0 1 - 1 1 1   Copy design 0 0 0 - 1 0 1  Copy design-comments - - 4 animals - - - -  Total score 10 6 15  - 21 21 19    Cranial Nerves: Pupils equal, briskly reactive to light. Extraocular movements full without nystagmus. Visual fields full to confrontation. Hearing intact. Facial sensation intact. Face, tongue, palate moves normally and symmetrically.  Motor: Normal bulk and tone. Normal strength in all tested extremity muscles. Sensory.: intact to touch , pinprick , position and vibratory sensation.  Coordination: Rapid alternating movements normal in all extremities. Finger-to-nose and heel-to-shin performed accurately bilaterally. Gait and Station: Arises from chair without difficulty. Stance is normal. Gait demonstrates normal stride length and balance without use of assistive device Reflexes: 1+ and symmetric. Toes downgoing.        ASSESSMENT/PLAN: Angel Green is a 80 y.o. year old female with underlying medical history of DM, HTN, chronic renal dysfunction and dementia.  Initially evaluated by Dr Krista Blue in 2019 with prior visit 06/2018.     -Cognition has been stable - PCP completed MMSE 05/2020 10/30 with prior testing 6/30 (02/2020) -No behavioral concerns -continue Seroquel 25 mg nightly -refills up-to-date -Continue Aricept 10 mg nightly and Namenda 10 mg twice daily.  Refills up to date   Follow up in 6 months or call earlier if needed   CC:  [facility] provider: Dr. Mayer Masker, Alda Berthold, MD     I spent 26 minutes of face-to-face and non-face-to-face time with patient and husband.  This included previsit chart review, lab review, study review, electronic health record documentation, patient and husband discussion and education regarding dementia and ongoing use of medications and answered all other questions to patient and husband satisfaction  Frann Rider, AGNP-BC  [facility] [address] [place], [place] [address]  Phone [phone] Fax [phone] Note: This document was prepared with digital dictation and possible smart phrase technology. Any transcriptional errors that result from this process are unintentional.

## 2020-09-08 ENCOUNTER — Ambulatory Visit (INDEPENDENT_AMBULATORY_CARE_PROVIDER_SITE_OTHER): Payer: Medicare Other | Admitting: Internal Medicine

## 2020-09-08 ENCOUNTER — Other Ambulatory Visit: Payer: Self-pay

## 2020-09-08 VITALS — BP 127/78 | HR 91 | Temp 98.2°F | Resp 18 | Ht 63.0 in | Wt 214.4 lb

## 2020-09-08 DIAGNOSIS — R209 Unspecified disturbances of skin sensation: Secondary | ICD-10-CM | POA: Diagnosis not present

## 2020-09-08 DIAGNOSIS — G6289 Other specified polyneuropathies: Secondary | ICD-10-CM | POA: Diagnosis not present

## 2020-09-08 LAB — CBC WITH DIFFERENTIAL/PLATELET
Basophils Absolute: 0.1 10*3/uL (ref 0.0–0.1)
Basophils Relative: 1.2 % (ref 0.0–3.0)
Eosinophils Absolute: 0.2 10*3/uL (ref 0.0–0.7)
Eosinophils Relative: 2.6 % (ref 0.0–5.0)
HCT: 41.5 % (ref 36.0–46.0)
Hemoglobin: 13.8 g/dL (ref 12.0–15.0)
Lymphocytes Relative: 34.2 % (ref 12.0–46.0)
Lymphs Abs: 2.4 10*3/uL (ref 0.7–4.0)
MCHC: 33.3 g/dL (ref 30.0–36.0)
MCV: 97.8 fl (ref 78.0–100.0)
Monocytes Absolute: 0.5 10*3/uL (ref 0.1–1.0)
Monocytes Relative: 7 % (ref 3.0–12.0)
Neutro Abs: 3.9 10*3/uL (ref 1.4–7.7)
Neutrophils Relative %: 55 % (ref 43.0–77.0)
Platelets: 216 10*3/uL (ref 150.0–400.0)
RBC: 4.24 Mil/uL (ref 3.87–5.11)
RDW: 14.6 % (ref 11.5–15.5)
WBC: 7.1 10*3/uL (ref 4.0–10.5)

## 2020-09-08 LAB — TSH: TSH: 1.34 u[IU]/mL (ref 0.35–5.50)

## 2020-09-08 NOTE — Patient Instructions (Signed)
Go to the lab.

## 2020-09-08 NOTE — Progress Notes (Signed)
Subjective:    Patient ID: Angel Green, female    DOB: Jul 11, 1940, 80 y.o.   MRN: [id number]  DOS:  09/08/2020 Type of visit - description: Acute Patient's husband brought her today because she complained of feeling cold all the time.  Particularly at her feet.  Recently  seen by another provider and diagnosed with neuropathy.  No fever chills No weight loss No nausea vomiting.  No constipation. The patient has dementia but she states is doing okay   Review of Systems See above   Past Medical History:  Diagnosis Date   Allergy    Dust Mites   Ankle pain    chronic   Diabetes mellitus, type 2 ([place])    Hyperlipidemia    Hypertension    Memory loss    Osteoarthritis    RENAL INSUFFICIENCY, CHRONIC 04/16/2010   Rhinitis    allergic nos    Past Surgical History:  Procedure Laterality Date   CATARACT EXTRACTION     CHOLECYSTECTOMY     dental implants     PARTIAL HYSTERECTOMY     in her 30s    Allergies as of 09/08/2020       Reactions   Dust Mite Mixed Allergen Ext [mite (d. Farinae)]         Medication List        Accurate as of September 08, 2020 11:59 PM. If you have any questions, ask your nurse or doctor.          amLODipine 5 MG tablet Commonly known as: NORVASC Take 1 tablet (5 mg total) by mouth daily.   Apoaequorin 20 MG Caps Take 20 mg by mouth daily.   aspirin 81 MG tablet Take 81 mg by mouth daily.   atorvastatin 10 MG tablet Commonly known as: LIPITOR Take 1 tablet (10 mg total) by mouth daily.   B-12 PO Take 1 tablet by mouth daily. Takes 5000 mcg by mouth daily.   CENTRUM SILVER PO Take 1 tablet by mouth daily.   donepezil 10 MG tablet Commonly known as: ARICEPT Take 1 tablet (10 mg total) by mouth at bedtime.   DULoxetine 20 MG capsule Commonly known as: Cymbalta Take 1 capsule (20 mg total) by mouth daily.   EPINEPHrine 0.3 mg/0.3 mL Soaj injection Commonly known as: EPI-PEN Inject 0.3 mg into the muscle once.  Reported on 06/30/2015   fluticasone 44 MCG/ACT inhaler Commonly known as: Flovent HFA Inhale 2 puffs into the lungs 2 (two) times daily.   freestyle lancets Use to monitor blood sugars 4 times per day, once before each meal and once at bedtime   FreeStyle Lite Devi Use to monitor your blood sugars   FREESTYLE LITE test strip Generic drug: glucose blood Use to monitor your blood sugars 4 times per day; E11.9   furosemide 20 MG tablet Commonly known as: LASIX Take 2 tablets (40 mg total) by mouth daily.   Insulin Lispro Prot & Lispro (75-25) 100 UNIT/ML Kwikpen Commonly known as: HumaLOG Mix 75/25 KwikPen Inject 38 Units into the skin daily with breakfast. And pen needles 1/day   loratadine 10 MG tablet Commonly known as: CLARITIN Take 1 tablet (10 mg total) by mouth daily.   losartan 50 MG tablet Commonly known as: COZAAR Take 1 tablet (50 mg total) by mouth daily.   memantine 10 MG tablet Commonly known as: NAMENDA Take 1 tablet (10 mg total) by mouth 2 (two) times daily.   omeprazole 20 MG capsule Commonly  known as: PRILOSEC Take 1 capsule (20 mg total) by mouth daily.   Polyethyl Glycol-Propyl Glycol 0.4-0.3 % Soln Apply 1 drop to eye daily as needed (for dry eyes).   potassium chloride 10 MEQ tablet Commonly known as: KLOR-CON Take 1 tablet (10 mEq total) by mouth daily.   potassium chloride 10 MEQ tablet Commonly known as: KLOR-CON Take 1 tablet (10 mEq total) by mouth 2 (two) times daily.   QUEtiapine 25 MG tablet Commonly known as: SEROquel Take 1 tablet (25 mg total) by mouth at bedtime.   Sure Comfort Pen Needles 31G X 5 MM Misc Generic drug: Insulin Pen Needle Use to inject insulin 1 time per day.   Trulicity 4.5 BP/1.0CH Sopn Generic drug: Dulaglutide Inject 4.5 mg into the skin once a week.           Objective:   Physical Exam BP 127/78 (BP Location: Left Arm, Patient Position: Sitting, Cuff Size: Large)   Pulse 91   Temp 98.2 F (36.8  C) (Oral)   Resp 18   Ht 5\' 3"  (1.6 m)   Wt 214 lb 6.4 oz (97.3 kg)   SpO2 98%   BMI 37.98 kg/m  General:   Well developed, NAD, BMI noted. HEENT:  Normocephalic . Face symmetric, atraumatic Lungs:  CTA B Normal respiratory effort, no intercostal retractions, no accessory muscle use. Heart: RRR,  no murmur.  Lower extremities: no pretibial edema bilaterally, pedal pulses present Skin: Not pale. Not jaundice Neurologic:  Pleasantly demented Psych--  Behavior appropriate. No anxious or depressed appearing.      Assessment     Assessment   DM  Dr Loanne Drilling + Neuropathy: Per foot exam 12-2014 HTN ---change ACE to ARB is 03/2014 Hyperlipidemia CRI  dx 2012  Sees Dr Arty Baumgartner  Morbid obesity DJD, had  chronic ankle pain Allergies -- dust mites , occ uses a inhaler , sees allergist, has shots q weeks started ~ 08-2014 Dementia:  MMSE 7/218: 24, rx Aricept.  B12, RPR, sed rate and TSH normal MMSE 07/2017 :13 worse MRI brain 6/19:  prominent right temporal lobe volume loss.  PLAN "Cold feeling": As described above, suspect this is age-related, labs reviewed, for completeness we will get a CBC and TSH. Neuropathy Seen at this office another provider 07/15/2020, labs were okay, was Rx Cymbalta, neuropathy symptoms mostly numbness at her feet improved.  RF Cymbalta as needed. Preventive care: Reviewed      This visit occurred during the SARS-CoV-2 public health emergency.  Safety protocols were in place, including screening questions prior to the visit, additional usage of staff PPE, and extensive cleaning of exam room while observing appropriate contact time as indicated for disinfecting solutions.

## 2020-09-09 ENCOUNTER — Other Ambulatory Visit: Payer: Self-pay

## 2020-09-09 ENCOUNTER — Telehealth: Payer: Self-pay | Admitting: Endocrinology

## 2020-09-09 MED ORDER — SURE COMFORT PEN NEEDLES 31G X 5 MM MISC: 2 refills | Status: DC

## 2020-09-09 NOTE — Telephone Encounter (Signed)
Sent in refill to mail order pharmacy

## 2020-09-09 NOTE — Telephone Encounter (Signed)
Pt's husband called to request : MEDICATION: pen needles 46mm  PHARMACY:   [place], [place] Phone:  [phone]  Fax:  [phone]      HAS THE PATIENT CONTACTED THEIR PHARMACY?  no  IS THIS A 90 DAY SUPPLY : yes  IS PATIENT OUT OF MEDICATION: yes  IF NOT; HOW MUCH IS LEFT:   LAST APPOINTMENT DATE: @6 /15/2022  NEXT APPOINTMENT DATE:@9 /08/2020  DO WE HAVE YOUR PERMISSION TO LEAVE A DETAILED MESSAGE?: yes   **Let patient know to contact pharmacy at the end of the day to make sure medication is ready. **  ** Please notify patient to allow 48-72 hours to process**  **Encourage patient to contact the pharmacy for refills or they can request refills through [facility]**

## 2020-09-09 NOTE — Assessment & Plan Note (Signed)
"  Cold feeling": As described above, suspect this is age-related, labs reviewed, for completeness we will get a CBC and TSH. Neuropathy Seen at this office another provider 07/15/2020, labs were okay, was Rx Cymbalta, neuropathy symptoms mostly numbness at her feet improved.  RF Cymbalta as needed. Preventive care: Reviewed

## 2020-09-09 NOTE — Assessment & Plan Note (Signed)
Preventive care reviewed: Tdap 2020 Shingrix: Completed PNM 13 2015, PNM 23 2009, 2020. COVID-vaccine x4 MMG 01/2020 DEXA normal 10-2017 Pap smears: No further screening, see previous entries. C-scope 2004 and 10-2012, tubular adenoma, Dr. Collene Mares

## 2020-09-24 ENCOUNTER — Ambulatory Visit: Payer: Medicare Other | Admitting: Adult Health

## 2020-09-29 ENCOUNTER — Other Ambulatory Visit: Payer: Self-pay | Admitting: Family

## 2020-10-24 DIAGNOSIS — Z6841 Body Mass Index (BMI) 40.0 and over, adult: Secondary | ICD-10-CM | POA: Diagnosis not present

## 2020-10-24 DIAGNOSIS — D631 Anemia in chronic kidney disease: Secondary | ICD-10-CM | POA: Diagnosis not present

## 2020-10-24 DIAGNOSIS — F0390 Unspecified dementia without behavioral disturbance: Secondary | ICD-10-CM | POA: Diagnosis not present

## 2020-10-24 DIAGNOSIS — E785 Hyperlipidemia, unspecified: Secondary | ICD-10-CM | POA: Diagnosis not present

## 2020-10-24 DIAGNOSIS — N2581 Secondary hyperparathyroidism of renal origin: Secondary | ICD-10-CM | POA: Diagnosis not present

## 2020-10-24 DIAGNOSIS — N1831 Chronic kidney disease, stage 3a: Secondary | ICD-10-CM | POA: Diagnosis not present

## 2020-10-24 DIAGNOSIS — Z23 Encounter for immunization: Secondary | ICD-10-CM | POA: Diagnosis not present

## 2020-10-24 DIAGNOSIS — I129 Hypertensive chronic kidney disease with stage 1 through stage 4 chronic kidney disease, or unspecified chronic kidney disease: Secondary | ICD-10-CM | POA: Diagnosis not present

## 2020-10-29 ENCOUNTER — Ambulatory Visit (INDEPENDENT_AMBULATORY_CARE_PROVIDER_SITE_OTHER): Payer: Medicare Other | Admitting: Endocrinology

## 2020-10-29 ENCOUNTER — Encounter: Payer: Self-pay | Admitting: Endocrinology

## 2020-10-29 ENCOUNTER — Other Ambulatory Visit: Payer: Self-pay

## 2020-10-29 VITALS — BP 130/82 | HR 90 | Ht 63.0 in | Wt 218.6 lb

## 2020-10-29 DIAGNOSIS — I739 Peripheral vascular disease, unspecified: Secondary | ICD-10-CM

## 2020-10-29 DIAGNOSIS — E119 Type 2 diabetes mellitus without complications: Secondary | ICD-10-CM | POA: Diagnosis not present

## 2020-10-29 LAB — POCT GLYCOSYLATED HEMOGLOBIN (HGB A1C): Hemoglobin A1C: 8.3 % — AB (ref 4.0–5.6)

## 2020-10-29 MED ORDER — INSULIN LISPRO PROT & LISPRO (75-25 MIX) 100 UNIT/ML KWIKPEN: 40.0000 [IU] | PEN_INJECTOR | Freq: Every day | SUBCUTANEOUS | 3 refills | Status: DC

## 2020-10-29 NOTE — Progress Notes (Signed)
Subjective:    Patient ID: Angel Green, female    DOB: 22-Jul-1940, 80 y.o.   MRN: [id number]  HPI Pt returns for f/u of diabetes mellitus: DM type: Insulin-requiring type 2.   Dx'ed: 2947 Complications: PN, PAD, and CRI.   Therapy: insulin since 6546, and Trulicity.   GDM: never.  DKA: never.   Severe hypoglycemia: once, in 2019.   Pancreatitis: never.  SDOH: rx is limited by memory loss; husband sets pen to # of units, and pt administers. Other: she changed to QAM premixed insulin, after poor results with multiple daily injections; pattern of cbg's indicates she does not need a PM dose; fructosamine converts to slightly higher A1c than A1c itself.  Interval history: no cbg record, but husband states cbg's vary from 100-200.  No new sxs.  husb says he never misses giving pt meds.  Past Medical History:  Diagnosis Date   Allergy    Dust Mites   Ankle pain    chronic   Diabetes mellitus, type 2 ([place])    Hyperlipidemia    Hypertension    Memory loss    Osteoarthritis    RENAL INSUFFICIENCY, CHRONIC 04/16/2010   Rhinitis    allergic nos    Past Surgical History:  Procedure Laterality Date   CATARACT EXTRACTION     CHOLECYSTECTOMY     dental implants     PARTIAL HYSTERECTOMY     in her 11s    Social History   Socioeconomic History   Marital status: Married    Spouse name: Not on file   Number of children: 2   Years of education: some college   Highest education level: Not on file  Occupational History   Occupation:  retired  Tobacco Use   Smoking status: Never   Smokeless tobacco: Never  Vaping Use   Vaping Use: Never used  Substance and Sexual Activity   Alcohol use: No    Alcohol/week: 0.0 standard drinks   Drug use: No   Sexual activity: Yes    Partners: Male  Other Topics Concern   Not on file  Social History Narrative   Husband is a Company secretary.   Right-handed.   1 cup caffeine daily.   Lives at home with husband.          Social Determinants  of Health   Financial Resource Strain: Not on file  Food Insecurity: Not on file  Transportation Needs: Not on file  Physical Activity: Not on file  Stress: Not on file  Social Connections: Not on file  Intimate Partner Violence: Not on file    Current Outpatient Medications on File Prior to Visit  Medication Sig Dispense Refill   amLODipine (NORVASC) 5 MG tablet Take 1 tablet (5 mg total) by mouth daily. 90 tablet 1   Apoaequorin 20 MG CAPS Take 20 mg by mouth daily.     aspirin 81 MG tablet Take 81 mg by mouth daily.     atorvastatin (LIPITOR) 10 MG tablet Take 1 tablet (10 mg total) by mouth daily. 90 tablet 1   Blood Glucose Monitoring Suppl (FREESTYLE LITE) DEVI Use to monitor your blood sugars 1 each 0   Cyanocobalamin (B-12 PO) Take 1 tablet by mouth daily. Takes 5000 mcg by mouth daily.     donepezil (ARICEPT) 10 MG tablet Take 1 tablet (10 mg total) by mouth at bedtime. 90 tablet 3   Dulaglutide (TRULICITY) 4.5 TK/3.5WS SOPN Inject 4.5 mg into the skin once a  week. 12 mL 3   DULoxetine (CYMBALTA) 20 MG capsule Take 1 capsule (20 mg total) by mouth daily. 90 capsule 1   EPINEPHrine 0.3 mg/0.3 mL IJ SOAJ injection Inject 0.3 mg into the muscle once. Reported on 06/30/2015     fluticasone (FLOVENT HFA) 44 MCG/ACT inhaler Inhale 2 puffs into the lungs 2 (two) times daily. 3 Inhaler 3   furosemide (LASIX) 20 MG tablet Take 2 tablets (40 mg total) by mouth daily. 180 tablet 1   glucose blood (FREESTYLE LITE) test strip Use to monitor your blood sugars 4 times per day; E11.9 400 each 12   Lancets (FREESTYLE) lancets Use to monitor blood sugars 4 times per day, once before each meal and once at bedtime 400 each 12   loratadine (CLARITIN) 10 MG tablet Take 1 tablet (10 mg total) by mouth daily. 90 tablet 3   losartan (COZAAR) 50 MG tablet Take 1 tablet (50 mg total) by mouth daily. 90 tablet 1   memantine (NAMENDA) 10 MG tablet Take 1 tablet (10 mg total) by mouth 2 (two) times daily. 180  tablet 3   Multiple Vitamins-Minerals (CENTRUM SILVER PO) Take 1 tablet by mouth daily.     omeprazole (PRILOSEC) 20 MG capsule Take 1 capsule (20 mg total) by mouth daily. 90 capsule 3   Polyethyl Glycol-Propyl Glycol 0.4-0.3 % SOLN Apply 1 drop to eye daily as needed (for dry eyes).     potassium chloride (KLOR-CON) 10 MEQ tablet Take 1 tablet (10 mEq total) by mouth daily. 90 tablet 1   potassium chloride (KLOR-CON) 10 MEQ tablet Take 1 tablet (10 mEq total) by mouth 2 (two) times daily. 15 tablet 0   QUEtiapine (SEROQUEL) 25 MG tablet Take 1 tablet (25 mg total) by mouth at bedtime. 90 tablet 3   SURE COMFORT PEN NEEDLES 31G X 5 MM MISC Use to inject insulin 1 time per day. 100 each 2   No current facility-administered medications on file prior to visit.    Allergies  Allergen Reactions   Dust Mite Mixed Allergen Ext [Mite (D. Farinae)]     Family History  Problem Relation Age of Onset   Healthy Brother    Healthy Son    Healthy Son    Other Mother        unsure of history - "old age"   Other Father        unsure of history - "old age"   Heart attack Neg Hx    Colon cancer Neg Hx    Breast cancer Neg Hx    Stroke Neg Hx     BP 130/82 (BP Location: Right Arm, Patient Position: Sitting, Cuff Size: Normal)   Pulse 90   Ht 5\' 3"  (1.6 m)   Wt 218 lb 9.6 oz (99.2 kg)   SpO2 96%   BMI 38.72 kg/m    Review of Systems     Objective:   Physical Exam VITAL SIGNS:  See vs page GENERAL: no distress Pulses: dorsalis pedis intact bilat.   MSK: no deformity of the feet CV: trace bilat leg edema Skin:  no ulcer on the feet.  normal color and temp on the feet.  Neuro: sensation is intact to touch on the feet.    Lab Results  Component Value Date   HGBA1C 8.3 (A) 10/29/2020   Lab Results  Component Value Date   CREATININE 1.76 (H) 07/15/2020   BUN 16 07/15/2020   NA 139 07/15/2020  K 3.6 07/15/2020   CL 101 07/15/2020   CO2 27 07/15/2020      Assessment & Plan:   Insulin-requiring type 2 DM: uncontrolled.   Patient Instructions  I have sent a prescription to your pharmacy, to increase the insulin to 40 units with breakfast. Please continue the same Trulicity Please come back for a follow-up appointment in January check your blood sugar twice a day.  vary the time of day when you check, between before the 3 meals, and at bedtime.  also check if you have symptoms of your blood sugar being too high or too low.  please keep a record of the readings and bring it to your next appointment here (or you can bring the meter itself).  You can write it on any piece of paper.  please call us sooner if your blood sugar goes below 70, or if you have a lot of readings over 200.

## 2020-10-29 NOTE — Patient Instructions (Addendum)
I have sent a prescription to your pharmacy, to increase the insulin to 40 units with breakfast. Please continue the same Trulicity Please come back for a follow-up appointment in January check your blood sugar twice a day.  vary the time of day when you check, between before the 3 meals, and at bedtime.  also check if you have symptoms of your blood sugar being too high or too low.  please keep a record of the readings and bring it to your next appointment here (or you can bring the meter itself).  You can write it on any piece of paper.  please call us sooner if your blood sugar goes below 70, or if you have a lot of readings over 200.

## 2020-11-09 ENCOUNTER — Other Ambulatory Visit: Payer: Self-pay | Admitting: Internal Medicine

## 2020-11-25 ENCOUNTER — Other Ambulatory Visit: Payer: Self-pay | Admitting: *Deleted

## 2020-11-25 DIAGNOSIS — I739 Peripheral vascular disease, unspecified: Secondary | ICD-10-CM

## 2020-11-28 ENCOUNTER — Ambulatory Visit: Payer: Medicare Other | Admitting: Vascular Surgery

## 2020-12-02 ENCOUNTER — Other Ambulatory Visit: Payer: Self-pay | Admitting: Internal Medicine

## 2020-12-02 DIAGNOSIS — E876 Hypokalemia: Secondary | ICD-10-CM

## 2020-12-08 ENCOUNTER — Ambulatory Visit (INDEPENDENT_AMBULATORY_CARE_PROVIDER_SITE_OTHER): Payer: Medicare Other | Admitting: Physician Assistant

## 2020-12-08 ENCOUNTER — Ambulatory Visit (HOSPITAL_COMMUNITY)
Admission: RE | Admit: 2020-12-08 | Discharge: 2020-12-08 | Disposition: A | Discharge status: Home / Self Care | Payer: Medicare Other | Source: Ambulatory Visit | Attending: Physician Assistant | Admitting: Physician Assistant

## 2020-12-08 ENCOUNTER — Other Ambulatory Visit: Payer: Self-pay

## 2020-12-08 VITALS — BP 132/73 | HR 92 | Temp 97.4°F | Resp 14 | Ht 64.5 in | Wt 221.0 lb

## 2020-12-08 DIAGNOSIS — R209 Unspecified disturbances of skin sensation: Secondary | ICD-10-CM

## 2020-12-08 DIAGNOSIS — I739 Peripheral vascular disease, unspecified: Secondary | ICD-10-CM | POA: Diagnosis not present

## 2020-12-08 NOTE — Progress Notes (Signed)
Peripheral Arterial Disease Follow-Up   VASCULAR SURGERY ASSESSMENT & PLAN:   Alonah Lineback is a 80 y.o. female presents for evaluation for cold feet. This was carried out in March with Dr. Carlis Abbott. She is accompanied by her husband. No new changes.  No evidence of significant lower arterial vascular disease. Today, her skin temperature of both feet is normal.  Normal ABIs  with palpable DP pulses.  Patient is without claudication or rest pain. Continue optimal medical management of diabetes, hypertension and follow-up with primary care physician. Non-smoker. Continue the following medications: statin, aspirin Follow-up as needed.  SUBJECTIVE:   The patient denies lower extremity pain with exercise or rest pain.  Denies skin loss or ulceration.  PHYSICAL EXAM:   Vitals:   12/08/20 1356  BP: 132/73  Pulse: 92  Resp: 14  Temp: (!) 97.4 F (36.3 C)  TempSrc: Temporal  Weight: 221 lb (100.2 kg)  Height: 5' 4.5" (1.638 m)    General appearance: Well-developed, well-nourished in no apparent distress Neurologic: Alert and oriented x 4. Cardiovascular: Heart rate and rhythm are regular.   Abdomen: No palpable pulsatile mass. Extremities: Skin intact.  Both feet are warm and well perfused.  Motor function and sensation intact Pulse exam: 2+ femoral, dorsalis pedis, posterior tibial pulses bilaterally   NON-INVASIVE VASCULAR STUDIES  12/08/2020 ABI Findings:  +---------+------------------+-----+---------+--------+  Right    Rt Pressure (mmHg)IndexWaveform Comment   +---------+------------------+-----+---------+--------+  Brachial 148                                       +---------+------------------+-----+---------+--------+  PTA      144               0.95 triphasic          +---------+------------------+-----+---------+--------+  DP       157               1.04 triphasic          +---------+------------------+-----+---------+--------+  Great  Toe104               0.69                    +---------+------------------+-----+---------+--------+   +---------+------------------+-----+---------+-------+  Left     Lt Pressure (mmHg)IndexWaveform Comment  +---------+------------------+-----+---------+-------+  Brachial 151                                      +---------+------------------+-----+---------+-------+  PTA      151               1.00 triphasic         +---------+------------------+-----+---------+-------+  DP       147               0.97 triphasic         +---------+------------------+-----+---------+-------+  Great Toe108               0.72                   +---------+------------------+-----+---------+-------+   +-------+-----------+-----------+------------+------------+  ABI/TBIToday's ABIToday's TBIPrevious ABIPrevious TBI  +-------+-----------+-----------+------------+------------+  Right  1.04       0.69                                 +-------+-----------+-----------+------------+------------+  Left   1.00       0.72                                 +-------+-----------+-----------+------------+------------+    Summary:  Right: Resting right ankle-brachial index is within normal range. No  evidence of significant right lower extremity arterial disease. The right  toe-brachial index is abnormal.   Left: Resting left ankle-brachial index is within normal range. No  evidence of significant left lower extremity arterial disease. The left  toe-brachial index is normal.     *See table(s) above for measurements and observations.    Preliminary    PROBLEM LIST:    The patient's past medical history, past surgical history, family history, social history, allergy list and medication list are reviewed.   CURRENT MEDS:    Current Outpatient Medications:    amLODipine (NORVASC) 5 MG tablet, Take 1 tablet (5 mg total) by mouth daily., Disp: 90 tablet, Rfl: 1    Apoaequorin 20 MG CAPS, Take 20 mg by mouth daily., Disp: , Rfl:    aspirin 81 MG tablet, Take 81 mg by mouth daily., Disp: , Rfl:    atorvastatin (LIPITOR) 10 MG tablet, TAKE 1 TABLET DAILY, Disp: 90 tablet, Rfl: 1   Blood Glucose Monitoring Suppl (FREESTYLE LITE) DEVI, Use to monitor your blood sugars, Disp: 1 each, Rfl: 0   Cyanocobalamin (B-12 PO), Take 1 tablet by mouth daily. Takes 5000 mcg by mouth daily., Disp: , Rfl:    donepezil (ARICEPT) 10 MG tablet, Take 1 tablet (10 mg total) by mouth at bedtime., Disp: 90 tablet, Rfl: 3   Dulaglutide (TRULICITY) 4.5 GB/1.5VV SOPN, Inject 4.5 mg into the skin once a week., Disp: 12 mL, Rfl: 3   DULoxetine (CYMBALTA) 20 MG capsule, Take 1 capsule (20 mg total) by mouth daily., Disp: 90 capsule, Rfl: 1   EPINEPHrine 0.3 mg/0.3 mL IJ SOAJ injection, Inject 0.3 mg into the muscle once. Reported on 06/30/2015, Disp: , Rfl:    fluticasone (FLOVENT HFA) 44 MCG/ACT inhaler, Inhale 2 puffs into the lungs 2 (two) times daily., Disp: 3 Inhaler, Rfl: 3   furosemide (LASIX) 20 MG tablet, Take 2 tablets (40 mg total) by mouth daily., Disp: 180 tablet, Rfl: 1   glucose blood (FREESTYLE LITE) test strip, Use to monitor your blood sugars 4 times per day; E11.9, Disp: 400 each, Rfl: 12   Insulin Lispro Prot & Lispro (HUMALOG MIX 75/25 KWIKPEN) (75-25) 100 UNIT/ML Kwikpen, Inject 40 Units into the skin daily with breakfast. And pen needles 1/day, Disp: 45 mL, Rfl: 3   Lancets (FREESTYLE) lancets, Use to monitor blood sugars 4 times per day, once before each meal and once at bedtime, Disp: 400 each, Rfl: 12   loratadine (CLARITIN) 10 MG tablet, Take 1 tablet (10 mg total) by mouth daily., Disp: 90 tablet, Rfl: 3   losartan (COZAAR) 50 MG tablet, TAKE 1 TABLET DAILY, Disp: 90 tablet, Rfl: 1   memantine (NAMENDA) 10 MG tablet, Take 1 tablet (10 mg total) by mouth 2 (two) times daily., Disp: 180 tablet, Rfl: 3   Multiple Vitamins-Minerals (CENTRUM SILVER PO), Take 1 tablet by  mouth daily., Disp: , Rfl:    omeprazole (PRILOSEC) 20 MG capsule, Take 1 capsule (20 mg total) by mouth daily., Disp: 90 capsule, Rfl: 3   Polyethyl Glycol-Propyl Glycol 0.4-0.3 % SOLN, Apply 1 drop to eye daily as needed (for dry  eyes)., Disp: , Rfl:    potassium chloride (KLOR-CON) 10 MEQ tablet, TAKE 1 TABLET DAILY, Disp: 90 tablet, Rfl: 1   QUEtiapine (SEROQUEL) 25 MG tablet, Take 1 tablet (25 mg total) by mouth at bedtime., Disp: 90 tablet, Rfl: 3   SURE COMFORT PEN NEEDLES 31G X 5 MM MISC, Use to inject insulin 1 time per day., Disp: 100 each, Rfl: 2   REVIEW OF SYSTEMS:   [X]  denotes positive finding, [ ]  denotes negative finding Cardiac  Comments:  Chest pain or chest pressure:    Shortness of breath upon exertion:    Short of breath when lying flat:    Irregular heart rhythm:        Vascular    Pain in calf, thigh, or hip brought on by ambulation:    Pain in feet at night that wakes you up from your sleep:     Blood clot in your veins:    Leg swelling:         Pulmonary    Oxygen at home:    Productive cough:     Wheezing:         Neurologic    Sudden weakness in arms or legs:     Sudden numbness in arms or legs:     Sudden onset of difficulty speaking or slurred speech:    Temporary loss of vision in one eye:     Problems with dizziness:         Gastrointestinal    Blood in stool:     Vomited blood:         Genitourinary    Burning when urinating:     Blood in urine:        Psychiatric    Major depression:         Hematologic    Bleeding problems:    Problems with blood clotting too easily:        Skin    Rashes or ulcers:        Constitutional    Fever or chills:     Barbie Banner, PA-C  Office: [phone] 12/08/2020 Dr. Virl Cagey

## 2020-12-18 ENCOUNTER — Ambulatory Visit (INDEPENDENT_AMBULATORY_CARE_PROVIDER_SITE_OTHER): Payer: Medicare Other | Admitting: Internal Medicine

## 2020-12-18 ENCOUNTER — Other Ambulatory Visit: Payer: Self-pay

## 2020-12-18 ENCOUNTER — Encounter: Payer: Self-pay | Admitting: Internal Medicine

## 2020-12-18 VITALS — BP 132/82 | HR 97 | Temp 97.6°F | Resp 18 | Ht 65.0 in | Wt 223.0 lb

## 2020-12-18 DIAGNOSIS — N184 Chronic kidney disease, stage 4 (severe): Secondary | ICD-10-CM | POA: Diagnosis not present

## 2020-12-18 DIAGNOSIS — I10 Essential (primary) hypertension: Secondary | ICD-10-CM

## 2020-12-18 DIAGNOSIS — N189 Chronic kidney disease, unspecified: Secondary | ICD-10-CM

## 2020-12-18 LAB — BASIC METABOLIC PANEL
BUN: 14 mg/dL (ref 6–23)
CO2: 32 mEq/L (ref 19–32)
Calcium: 9.7 mg/dL (ref 8.4–10.5)
Chloride: 97 mEq/L (ref 96–112)
Creatinine, Ser: 1.59 mg/dL — ABNORMAL HIGH (ref 0.40–1.20)
GFR: 30.02 mL/min — ABNORMAL LOW (ref >60.00)
Glucose, Bld: 379 mg/dL — ABNORMAL HIGH (ref 70–99)
Potassium: 3.9 mEq/L (ref 3.5–5.1)
Sodium: 137 mEq/L (ref 135–145)

## 2020-12-18 NOTE — Progress Notes (Signed)
Subjective:    Patient ID: Angel Green, female    DOB: March 11, 1940, 80 y.o.   MRN: [id number]  DOS:  12/18/2020 Type of visit - description: Routine follow-up Here with her husband. Everything seems to be going in the right direction. No major concerns. She saw vascular recently, note reviewed. Today she does not report or complain of cold feet.   BP Readings from Last 3 Encounters:  12/18/20 132/82  12/08/20 132/73  10/29/20 130/82     Review of Systems See above   Past Medical History:  Diagnosis Date   Allergy    Dust Mites   Ankle pain    chronic   Diabetes mellitus, type 2 ([place])    Hyperlipidemia    Hypertension    Memory loss    Osteoarthritis    RENAL INSUFFICIENCY, CHRONIC 04/16/2010   Rhinitis    allergic nos    Past Surgical History:  Procedure Laterality Date   CATARACT EXTRACTION     CHOLECYSTECTOMY     dental implants     PARTIAL HYSTERECTOMY     in her 30s    Allergies as of 12/18/2020       Reactions   Dust Mite Mixed Allergen Ext [mite (d. Farinae)]         Medication List        Accurate as of December 18, 2020 11:59 PM. If you have any questions, ask your nurse or doctor.          amLODipine 5 MG tablet Commonly known as: NORVASC Take 1 tablet (5 mg total) by mouth daily.   Apoaequorin 20 MG Caps Take 20 mg by mouth daily.   aspirin 81 MG tablet Take 81 mg by mouth daily.   atorvastatin 10 MG tablet Commonly known as: LIPITOR TAKE 1 TABLET DAILY   B-12 PO Take 1 tablet by mouth daily. Takes 5000 mcg by mouth daily.   CENTRUM SILVER PO Take 1 tablet by mouth daily.   donepezil 10 MG tablet Commonly known as: ARICEPT Take 1 tablet (10 mg total) by mouth at bedtime.   DULoxetine 20 MG capsule Commonly known as: CYMBALTA Take 1 capsule (20 mg total) by mouth daily.   EPINEPHrine 0.3 mg/0.3 mL Soaj injection Commonly known as: EPI-PEN Inject 0.3 mg into the muscle once. Reported on 06/30/2015   fluticasone  44 MCG/ACT inhaler Commonly known as: Flovent HFA Inhale 2 puffs into the lungs 2 (two) times daily.   freestyle lancets Use to monitor blood sugars 4 times per day, once before each meal and once at bedtime   FreeStyle Lite Devi Use to monitor your blood sugars   FREESTYLE LITE test strip Generic drug: glucose blood Use to monitor your blood sugars 4 times per day; E11.9   furosemide 20 MG tablet Commonly known as: LASIX Take 2 tablets (40 mg total) by mouth daily.   Insulin Lispro Prot & Lispro (75-25) 100 UNIT/ML Kwikpen Commonly known as: HumaLOG Mix 75/25 KwikPen Inject 40 Units into the skin daily with breakfast. And pen needles 1/day   loratadine 10 MG tablet Commonly known as: CLARITIN Take 1 tablet (10 mg total) by mouth daily.   losartan 50 MG tablet Commonly known as: COZAAR TAKE 1 TABLET DAILY   memantine 10 MG tablet Commonly known as: NAMENDA Take 1 tablet (10 mg total) by mouth 2 (two) times daily.   omeprazole 20 MG capsule Commonly known as: PRILOSEC Take 1 capsule (20 mg total) by mouth  daily.   Polyethyl Glycol-Propyl Glycol 0.4-0.3 % Soln Apply 1 drop to eye daily as needed (for dry eyes).   potassium chloride 10 MEQ tablet Commonly known as: KLOR-CON TAKE 1 TABLET DAILY   QUEtiapine 25 MG tablet Commonly known as: SEROquel Take 1 tablet (25 mg total) by mouth at bedtime.   Sure Comfort Pen Needles 31G X 5 MM Misc Generic drug: Insulin Pen Needle Use to inject insulin 1 time per day.   Trulicity 4.5 QB/3.4LP Sopn Generic drug: Dulaglutide Inject 4.5 mg into the skin once a week.           Objective:   Physical Exam BP 132/82 (BP Location: Left Arm, Patient Position: Sitting, Cuff Size: Normal)   Pulse 97   Temp 97.6 F (36.4 C) (Oral)   Resp 18   Ht 5\' 5"  (1.651 m)   Wt 223 lb (101.2 kg)   SpO2 98%   BMI 37.11 kg/m  General:   Well developed, NAD, BMI noted. HEENT:  Normocephalic . Face symmetric, atraumatic Lungs:   CTA B Normal respiratory effort, no intercostal retractions, no accessory muscle use. Heart: RRR,  no murmur.  Lower extremities: no pretibial edema bilaterally  Skin:  At the tip of the nose, she has superficial ulcers at the both sides , seem to be from scratching.   Neurologic:  alert & pleasantly demented, follows simple commands.  Very quiet demeanor Psych--  Behavior appropriate. No anxious or depressed appearing.      Assessment     Assessment   DM  Dr Loanne Drilling + Neuropathy: Per foot exam 12-2014 HTN ---change ACE to ARB is 03/2014 Hyperlipidemia CRI  dx 2012  Sees Dr Arty Baumgartner  Morbid obesity DJD, had  chronic ankle pain Allergies -- dust mites , occ uses a inhaler , sees allergist, has shots q weeks started ~ 08-2014 Dementia:  MMSE 7/218: 24, rx Aricept.  B12, RPR, sed rate and TSH normal MMSE 07/2017 :13 worse MRI brain 6/19:  prominent right temporal lobe volume loss.  PLAN Cold feet:  last visit to vascular reviewed, no evidence of peripheral vascular disease, was recommended to continue present care.  She did not raise the issue today. DM: Per Endo HTN: BP is consistently controlled, does not check ambulatory BPs.  Continue amlodipine, Lasix, losartan, potassium. CKD,Last visit with nephrology 10/24/2020, recheck BMP Preventive care: Had a flu shot, just got a COVID booster 2 days ago. RTC 4 months.  Next    This visit occurred during the SARS-CoV-2 public health emergency.  Safety protocols were in place, including screening questions prior to the visit, additional usage of staff PPE, and extensive cleaning of exam room while observing appropriate contact time as indicated for disinfecting solutions.

## 2020-12-18 NOTE — Patient Instructions (Addendum)
    GO TO THE LAB : Get the blood work     GO TO THE FRONT DESK, PLEASE SCHEDULE YOUR APPOINTMENTS Come back for a checkup in 4 to 5 months 

## 2020-12-19 NOTE — Assessment & Plan Note (Addendum)
Cold feet:  last visit to vascular reviewed, no evidence of peripheral vascular disease, was recommended to continue present care.  She did not raise the issue today. DM: Per Endo HTN: BP is consistently controlled, does not check ambulatory BPs.  Continue amlodipine, Lasix, losartan, potassium. CKD,Last visit with nephrology 10/24/2020, recheck BMP Preventive care: Had a flu shot, just got a COVID booster 2 days ago. RTC 4 months.  Next

## 2021-01-04 ENCOUNTER — Other Ambulatory Visit: Payer: Self-pay | Admitting: Internal Medicine

## 2021-01-05 ENCOUNTER — Other Ambulatory Visit: Payer: Self-pay | Admitting: Internal Medicine

## 2021-01-05 DIAGNOSIS — Z1231 Encounter for screening mammogram for malignant neoplasm of breast: Secondary | ICD-10-CM

## 2021-01-24 NOTE — Progress Notes (Signed)
Chart reviewed, agree above plan ?

## 2021-02-11 ENCOUNTER — Telehealth: Payer: Self-pay | Admitting: Internal Medicine

## 2021-02-11 DIAGNOSIS — F03C0 Unspecified dementia, severe, without behavioral disturbance, psychotic disturbance, mood disturbance, and anxiety: Secondary | ICD-10-CM

## 2021-02-11 DIAGNOSIS — I10 Essential (primary) hypertension: Secondary | ICD-10-CM

## 2021-02-11 NOTE — Telephone Encounter (Signed)
The patient has advanced dementia, I spoke with the husband today, I think he is experiencing caregiver fatigue. Please arrange a palliative care referral, DX dementia. Hopefully they will get some help or some direction on where to go.   She might benefit from adult daycare to free up some time for her Husband

## 2021-02-11 NOTE — Telephone Encounter (Signed)
Referral placed.

## 2021-02-17 ENCOUNTER — Telehealth: Payer: Self-pay

## 2021-02-17 ENCOUNTER — Other Ambulatory Visit: Payer: Self-pay

## 2021-02-17 ENCOUNTER — Ambulatory Visit
Admission: RE | Admit: 2021-02-17 | Discharge: 2021-02-17 | Disposition: A | Discharge status: Home / Self Care | Payer: Medicare Other | Source: Ambulatory Visit | Attending: Internal Medicine | Admitting: Internal Medicine

## 2021-02-17 DIAGNOSIS — Z1231 Encounter for screening mammogram for malignant neoplasm of breast: Secondary | ICD-10-CM

## 2021-02-17 NOTE — Telephone Encounter (Signed)
Spoke with patient husband [name] and scheduled an in-person Palliative Consult for 02/25/21 @ 12:30PM.   COVID screening was negative. No pets in home. Patient lives with husband.  Consent obtained; updated Outlook/Netsmart/Team List and Epic.   Family is aware they may be receiving a call from provider the day before or day of to confirm appointment.

## 2021-02-25 ENCOUNTER — Other Ambulatory Visit: Payer: Medicare Other | Admitting: Hospice

## 2021-02-25 ENCOUNTER — Other Ambulatory Visit: Payer: Self-pay

## 2021-02-25 DIAGNOSIS — Z794 Long term (current) use of insulin: Secondary | ICD-10-CM | POA: Diagnosis not present

## 2021-02-25 DIAGNOSIS — Z515 Encounter for palliative care: Secondary | ICD-10-CM | POA: Diagnosis not present

## 2021-02-25 DIAGNOSIS — E119 Type 2 diabetes mellitus without complications: Secondary | ICD-10-CM

## 2021-02-25 DIAGNOSIS — F0390 Unspecified dementia without behavioral disturbance: Secondary | ICD-10-CM

## 2021-02-25 NOTE — Progress Notes (Signed)
[facility] Palliative Care Consult Note Telephone: [phone]  Fax: [phone]  PATIENT NAME: Angel Green [address] [place] [address] [phone] (home)  DOB: January 06, 1941 MRN: [id number]  PRIMARY CARE PROVIDER:    Colon Branch, MD,  [address] [place] [address] [phone]  REFERRING PROVIDER:   Colon Branch, MD [address] [place],  [place] [address] [phone]  RESPONSIBLE PARTY:   Angel Green Information     Name Relation Home Work [place] Spouse [phone]  [phone]       I met face to face with patient and family at home. Palliative Care was asked to follow this patient by consultation request of  Angel Branch, MD to address advance care planning, complex medical decision making and goals of care clarification. This is the initial visit.    ASSESSMENT AND / RECOMMENDATIONS:   Advance Care Planning: Our advance care planning conversation included a discussion about:    The value and importance of advance care planning  Difference between Hospice and Palliative care Exploration of goals of care in the event of a sudden injury or illness  Identification and preparation of a healthcare agent  Review and updating or creation of an  advance directive document . Decision not to resuscitate or to de-escalate disease focused treatments due to poor prognosis.  CODE STATUS: Discussion on the ramifications and implications of code status. Patient elects Full code.  Goals of Care: Goals include to maximize quality of life and symptom management Patient wishes for patient to get out in the community and meet with other people and engage in activities that engage her. NP discussed the PACE program and [employer] with spouse and he said he would explore. I spent  46 minutes providing this initial consultation. More than 50% of the time in this consultation was  spent on counseling patient and coordinating communication. --------------------------------------------------------------------------------------------------------------------------------------  Symptom Management/Plan: Dementia: Progressive memory loss/confusion since three and half years ago, now incontinent of bladder FAST 6C. Encourage reminiscence, word search/puzzles, cueing for recollection.  Promote calm approach and engaging environment.  Continue donepezil and memantine as currently ordered.  Continue ongoing supportive care Type 2 DM: Managed with Insulin and Trulicity. Last A1c in August 2022 is 8.3. No concentrated sweets. Diabetic diet, no empty caloric beverages. Check CBG as ordered to help ensure tight control. Fu with Endocrinologist 02/26/2021.  Repeat A1c.   Follow up: Palliative care will continue to follow for complex medical decision making, advance care planning, and clarification of goals. Return 6 weeks or prn. Encouraged to call provider sooner with any concerns.   Family /Caregiver/Community Supports: Patient lives at home with her spouse  HOSPICE ELIGIBILITY/DIAGNOSIS: TBD  Chief Complaint: Initial Palliative care visit  HISTORY OF PRESENT ILLNESS:  Angel Green is a 81 y.o. year old female  with multiple medical conditions including  Dementia which is progressive with memory loss and confusion in line with Dementia disease trajectory.  Memory loss is chronic, worsening in line with dementia disease trajectory; this affects patient's quality of life and independence. Spouse reports memory loss is worse as the day progresses; cues for recollection sometimes helpful. History of HTN, CKD, Type 2 DM. Patient denies pain/discomfort.Spouse main concern is how to keep patient engaged and this was extensively discussed, resources provided, and all questions answered. History obtained from review of EMR, discussion with primary team, caregiver, family and/or Angel Green.  Review and summarization of Epic records shows history from other than patient. Rest of 10 point ROS asked and negative.  I reviewed as needed, available labs, patient records, imaging, studies and related documents from the EMR.  ROS General: NAD EYES: denies vision changes ENMT: denies dysphagia Cardiovascular: denies chest pain/discomfort Pulmonary: denies cough, denies SOB Abdomen: endorses good appetite, denies constipation/diarrhea GU: denies dysuria, urinary frequency MSK:  endorses weakness,  no falls reported Skin: denies rashes or wounds Neurological: denies pain, denies insomnia Psych: Endorses positive mood Heme/lymph/immuno: denies bruises, abnormal bleeding  Physical Exam: Height/Weight: 5 feet 5 inches/223 Ibs Constitutional: NAD General: Well groomed, cooperative EYES: anicteric sclera, lids intact, no discharge , wears eye glasses ENMT: Moist mucous membrane CV: S1 S2, RRR, no LE edema Pulmonary: LCTA, no increased work of breathing, no cough, Abdomen: active BS + 4 quadrants, soft and non tender GU: no suprapubic tenderness MSK: weakness, ambulatory without assistive device Skin: warm and dry, no rashes or wounds on visible skin Neuro:  weakness, otherwise non focal, memory loss/confusion Psych: non-anxious affect Hem/lymph/immuno: no widespread bruising   PAST MEDICAL HISTORY:  Active Ambulatory Problems    Diagnosis Date Noted   Hyperlipidemia 01/25/2007   HTN (hypertension) 08/30/2006   Seasonal and perennial allergic rhinitis 10/20/2006   GERD 09/30/2008   OSTEOARTHROSIS, GENERALIZED, MULTIPLE SITES 08/30/2006   ANKLE PAIN, CHRONIC 12/08/2006   EDEMA 06/10/2008   CKD (chronic kidney disease) 04/16/2010   Fatigue 05/22/2010   Tracheobronchitis, chronic ([facility]) 08/20/2010   Angioedema 10/22/2011   Annual physical exam 12/14/2011   Leg ulcer, left ([facility]) 10/12/2013   PCP NOTES >>>>> 12/28/2014   Diabetes ([facility]) 04/23/2015   Morbid obesity ([facility])  06/30/2015   Cognitive impairment 09/22/2016   Dementia ([facility]) 08/14/2017   Numbness 03/26/2020   PAD (peripheral artery disease) ([facility]) 04/21/2020   Resolved Ambulatory Problems    Diagnosis Date Noted   Bronchitis 06/03/2010   Dyspnea 10/12/2010   Cough 06/16/2011   Past Medical History:  Diagnosis Date   Allergy    Ankle pain    Diabetes mellitus, type 2 (H[facility])    Hypertension    Memory loss    Osteoarthritis    RENAL INSUFFICIENCY, CHRONIC 04/16/2010   Rhinitis     SOCIAL HX:  Social History   Tobacco Use   Smoking status: Never   Smokeless tobacco: Never  Substance Use Topics   Alcohol use: No    Alcohol/week: 0.0 standard drinks     FAMILY HX:  Family History  Problem Relation Age of Onset   Healthy Brother    Healthy Son    Healthy Son    Other Mother        unsure of history - "old age"   Other Father        unsure of history - "old age"   Heart attack Neg Hx    Angel cancer Neg Hx    Breast cancer Neg Hx    Stroke Neg Hx       ALLERGIES:  Allergies  Allergen Reactions   Dust Mite Mixed Allergen Ext [Mite (D. Farinae)]       PERTINENT MEDICATIONS:  Outpatient Encounter Medications as of 02/25/2021  Medication Sig   amLODipine (NORVASC) 5 MG tablet Take 1 tablet (5 mg total) by mouth daily.   Apoaequorin 20 MG CAPS Take 20 mg by mouth daily.   aspirin 81 MG tablet Take 81 mg by mouth daily.   atorvastatin (LIPITOR) 10 MG tablet  TAKE 1 TABLET DAILY   Blood Glucose Monitoring Suppl (FREESTYLE LITE) DEVI Use to monitor your blood sugars   Cyanocobalamin (B-12 PO) Take 1 tablet by mouth daily. Takes 5000 mcg by mouth daily.   donepezil (ARICEPT) 10 MG tablet Take 1 tablet (10 mg total) by mouth at bedtime.   Dulaglutide (TRULICITY) 4.5 YB/3.3OV SOPN Inject 4.5 mg into the skin once a week.   DULoxetine (CYMBALTA) 20 MG capsule Take 1 capsule (20 mg total) by mouth daily.   EPINEPHrine 0.3 mg/0.3 mL IJ SOAJ injection Inject 0.3 mg into the muscle once.  Reported on 06/30/2015 (Patient not taking: Reported on 12/18/2020)   fluticasone (FLOVENT HFA) 44 MCG/ACT inhaler Inhale 2 puffs into the lungs 2 (two) times daily.   furosemide (LASIX) 20 MG tablet TAKE 2 TABLETS DAILY   glucose blood (FREESTYLE LITE) test strip Use to monitor your blood sugars 4 times per day; E11.9   Insulin Lispro Prot & Lispro (HUMALOG MIX 75/25 KWIKPEN) (75-25) 100 UNIT/ML Kwikpen Inject 40 Units into the skin daily with breakfast. And pen needles 1/day   Lancets (FREESTYLE) lancets Use to monitor blood sugars 4 times per day, once before each meal and once at bedtime   loratadine (CLARITIN) 10 MG tablet Take 1 tablet (10 mg total) by mouth daily.   losartan (COZAAR) 50 MG tablet TAKE 1 TABLET DAILY   memantine (NAMENDA) 10 MG tablet Take 1 tablet (10 mg total) by mouth 2 (two) times daily.   Multiple Vitamins-Minerals (CENTRUM SILVER PO) Take 1 tablet by mouth daily.   omeprazole (PRILOSEC) 20 MG capsule Take 1 capsule (20 mg total) by mouth daily.   Polyethyl Glycol-Propyl Glycol 0.4-0.3 % SOLN Apply 1 drop to eye daily as needed (for dry eyes).   potassium chloride (KLOR-CON) 10 MEQ tablet TAKE 1 TABLET DAILY   QUEtiapine (SEROQUEL) 25 MG tablet Take 1 tablet (25 mg total) by mouth at bedtime.   SURE COMFORT PEN NEEDLES 31G X 5 MM MISC Use to inject insulin 1 time per day.   No facility-administered encounter medications on file as of 02/25/2021.   Thank you for the opportunity to participate in the care of Ms. Limburg.  The palliative care team will continue to follow. Please call our office at [phone] if we can be of additional assistance.   Note: Portions of this note were generated with [employer]. Dictation errors may occur despite best attempts at proofreading.  Teodoro Spray, NP

## 2021-03-02 ENCOUNTER — Other Ambulatory Visit: Payer: Self-pay | Admitting: Internal Medicine

## 2021-03-02 ENCOUNTER — Other Ambulatory Visit: Payer: Self-pay | Admitting: Adult Health

## 2021-03-04 ENCOUNTER — Other Ambulatory Visit: Payer: Self-pay

## 2021-03-04 ENCOUNTER — Ambulatory Visit (INDEPENDENT_AMBULATORY_CARE_PROVIDER_SITE_OTHER): Payer: Medicare Other | Admitting: Endocrinology

## 2021-03-04 VITALS — BP 120/64 | HR 99 | Ht 65.0 in | Wt 224.2 lb

## 2021-03-04 DIAGNOSIS — E119 Type 2 diabetes mellitus without complications: Secondary | ICD-10-CM

## 2021-03-04 LAB — POCT GLYCOSYLATED HEMOGLOBIN (HGB A1C): Hemoglobin A1C: 9.1 % — AB (ref 4.0–5.6)

## 2021-03-04 MED ORDER — OZEMPIC (2 MG/DOSE) 8 MG/3ML ~~LOC~~ SOPN: 2.0000 mg | PEN_INJECTOR | SUBCUTANEOUS | 3 refills | Status: DC

## 2021-03-04 MED ORDER — FREESTYLE LIBRE 2 READER DEVI: 1.0000 | Freq: Once | 1 refills | Status: AC

## 2021-03-04 MED ORDER — INSULIN LISPRO PROT & LISPRO (75-25 MIX) 100 UNIT/ML KWIKPEN: 50.0000 [IU] | PEN_INJECTOR | Freq: Every day | SUBCUTANEOUS | 3 refills | Status: DC

## 2021-03-04 MED ORDER — FREESTYLE LIBRE 2 SENSOR MISC: 1.0000 | 3 refills | Status: DC

## 2021-03-04 NOTE — Patient Instructions (Addendum)
I have sent 2 prescription to your pharmacy, to increase the insulin to 50 units with breakfast, and to change the Trulicity to Ozempic.  check your blood sugar twice a day.  vary the time of day when you check, between before the 3 meals, and at bedtime.  also check if you have symptoms of your blood sugar being too high or too low.  please keep a record of the readings and bring it to your next appointment here (or you can bring the meter itself).  You can write it on any piece of paper.  please call us sooner if your blood sugar goes below 70, or if you have a lot of readings over 200.   I have sent a prescription to your pharmacy, to medical supplier, for the continuous glucose monitor.   Please come back for a follow-up appointment in 2 months.

## 2021-03-04 NOTE — Progress Notes (Signed)
Subjective:    Patient ID: Angel Green, female    DOB: Oct 31, 1940, 81 y.o.   MRN: [id number]  HPI Pt returns for f/u of diabetes mellitus: DM type: Insulin-requiring type 2.   Dx'ed: 4403 Complications: PN, PAD, and CRI.   Therapy: insulin since 4742, and Trulicity.   GDM: never.  DKA: never.   Severe hypoglycemia: once, in 2019.   Pancreatitis: never.  SDOH: rx is limited by memory loss; husband sets pen to # of units, and pt administers. Other: she changed to QAM premixed insulin, after poor results with multiple daily injections; pattern of cbg's indicates she does not need a PM dose; fructosamine converts to slightly higher A1c than A1c itself.  Interval history: no cbg record, but husband states cbg's are in the 200's.  No new sxs.  husb says he never misses giving pt meds.  No recent steroids.  She is having trouble getting Trulicity.   Past Medical History:  Diagnosis Date   Allergy    Dust Mites   Ankle pain    chronic   Diabetes mellitus, type 2 ([facility])    Hyperlipidemia    Hypertension    Memory loss    Osteoarthritis    RENAL INSUFFICIENCY, CHRONIC 04/16/2010   Rhinitis    allergic nos    Past Surgical History:  Procedure Laterality Date   CATARACT EXTRACTION     CHOLECYSTECTOMY     dental implants     PARTIAL HYSTERECTOMY     in her 67s    Social History   Socioeconomic History   Marital status: Married    Spouse name: Not on file   Number of children: 2   Years of education: some college   Highest education level: Not on file  Occupational History   Occupation:  retired  Tobacco Use   Smoking status: Never   Smokeless tobacco: Never  Vaping Use   Vaping Use: Never used  Substance and Sexual Activity   Alcohol use: No    Alcohol/week: 0.0 standard drinks   Drug use: No   Sexual activity: Yes    Partners: Male  Other Topics Concern   Not on file  Social History Narrative   Husband is a Company secretary.   Right-handed.   1 cup caffeine daily.    Lives at home with husband.          Social Determinants of Health   Financial Resource Strain: Not on file  Food Insecurity: Not on file  Transportation Needs: Not on file  Physical Activity: Not on file  Stress: Not on file  Social Connections: Not on file  Intimate Partner Violence: Not on file    Current Outpatient Medications on File Prior to Visit  Medication Sig Dispense Refill   amLODipine (NORVASC) 5 MG tablet TAKE 1 TABLET DAILY 90 tablet 1   Apoaequorin 20 MG CAPS Take 20 mg by mouth daily.     aspirin 81 MG tablet Take 81 mg by mouth daily.     atorvastatin (LIPITOR) 10 MG tablet TAKE 1 TABLET DAILY 90 tablet 1   Blood Glucose Monitoring Suppl (FREESTYLE LITE) DEVI Use to monitor your blood sugars 1 each 0   Cyanocobalamin (B-12 PO) Take 1 tablet by mouth daily. Takes 5000 mcg by mouth daily.     donepezil (ARICEPT) 10 MG tablet TAKE 1 TABLET AT BEDTIME (PENDING APPOINTMENT 01/31/20) 90 tablet 3   DULoxetine (CYMBALTA) 20 MG capsule Take 1 capsule (20 mg total) by  mouth daily. 90 capsule 1   EPINEPHrine 0.3 mg/0.3 mL IJ SOAJ injection Inject 0.3 mg into the muscle once. Reported on 06/30/2015     fluticasone (FLOVENT HFA) 44 MCG/ACT inhaler Inhale 2 puffs into the lungs 2 (two) times daily. 3 Inhaler 3   furosemide (LASIX) 20 MG tablet TAKE 2 TABLETS DAILY 180 tablet 1   glucose blood (FREESTYLE LITE) test strip Use to monitor your blood sugars 4 times per day; E11.9 400 each 12   Lancets (FREESTYLE) lancets Use to monitor blood sugars 4 times per day, once before each meal and once at bedtime 400 each 12   loratadine (CLARITIN) 10 MG tablet Take 1 tablet (10 mg total) by mouth daily. 90 tablet 3   losartan (COZAAR) 50 MG tablet TAKE 1 TABLET DAILY 90 tablet 1   memantine (NAMENDA) 10 MG tablet Take 1 tablet (10 mg total) by mouth 2 (two) times daily. 180 tablet 3   Multiple Vitamins-Minerals (CENTRUM SILVER PO) Take 1 tablet by mouth daily.     omeprazole (PRILOSEC) 20  MG capsule Take 1 capsule (20 mg total) by mouth daily. 90 capsule 3   Polyethyl Glycol-Propyl Glycol 0.4-0.3 % SOLN Apply 1 drop to eye daily as needed (for dry eyes).     potassium chloride (KLOR-CON) 10 MEQ tablet TAKE 1 TABLET DAILY 90 tablet 1   QUEtiapine (SEROQUEL) 25 MG tablet Take 1 tablet (25 mg total) by mouth at bedtime. 90 tablet 3   SURE COMFORT PEN NEEDLES 31G X 5 MM MISC Use to inject insulin 1 time per day. 100 each 2   No current facility-administered medications on file prior to visit.    Allergies  Allergen Reactions   Dust Mite Mixed Allergen Ext [Mite (D. Farinae)]     Family History  Problem Relation Age of Onset   Healthy Brother    Healthy Son    Healthy Son    Other Mother        unsure of history - "old age"   Other Father        unsure of history - "old age"   Heart attack Neg Hx    Colon cancer Neg Hx    Breast cancer Neg Hx    Stroke Neg Hx     BP 120/64    Pulse 99    Ht 5\' 5"  (1.651 m)    Wt 224 lb 3.2 oz (101.7 kg)    SpO2 98%    BMI 37.31 kg/m    Review of Systems     Objective:   Physical Exam    Lab Results  Component Value Date   HGBA1C 9.1 (A) 03/04/2021      Assessment & Plan:  Insulin-requiring type 2 DM: uncontrolled.    Patient Instructions  I have sent 2 prescription to your pharmacy, to increase the insulin to 50 units with breakfast, and to change the Trulicity to Ozempic.  check your blood sugar twice a day.  vary the time of day when you check, between before the 3 meals, and at bedtime.  also check if you have symptoms of your blood sugar being too high or too low.  please keep a record of the readings and bring it to your next appointment here (or you can bring the meter itself).  You can write it on any piece of paper.  please call us sooner if your blood sugar goes below 70, or if you have a lot of readings  over 200.   I have sent a prescription to your pharmacy, to medical supplier, for the continuous glucose  monitor.   Please come back for a follow-up appointment in 2 months.

## 2021-03-05 ENCOUNTER — Other Ambulatory Visit: Payer: Medicare Other | Admitting: Hospice

## 2021-03-06 ENCOUNTER — Other Ambulatory Visit: Payer: Self-pay

## 2021-03-06 ENCOUNTER — Other Ambulatory Visit: Payer: Medicare Other | Admitting: Hospice

## 2021-03-06 DIAGNOSIS — E119 Type 2 diabetes mellitus without complications: Secondary | ICD-10-CM | POA: Diagnosis not present

## 2021-03-06 DIAGNOSIS — Z515 Encounter for palliative care: Secondary | ICD-10-CM

## 2021-03-06 DIAGNOSIS — F0390 Unspecified dementia without behavioral disturbance: Secondary | ICD-10-CM

## 2021-03-06 DIAGNOSIS — Z794 Long term (current) use of insulin: Secondary | ICD-10-CM

## 2021-03-06 NOTE — Progress Notes (Signed)
[facility] Palliative Care Consult Note Telephone: [phone]  Fax: [phone]  PATIENT NAME: Angel Green [address] [place] [address] [phone] (home)  DOB: September 12, 1940 MRN: [id number]  PRIMARY CARE PROVIDER:    Colon Branch, MD,  [address] [place] [address] [phone]  REFERRING PROVIDER:   Colon Branch, MD [address] [place],   [address] [phone]  RESPONSIBLE PARTY:   Rosine Beat Information     Name Relation Home Work [place] Spouse [phone]  [phone]       I met face to face with patient and family at home. Palliative Care was asked to follow this patient by consultation request of  Colon Branch, MD to address advance care planning, complex medical decision making and goals of care clarification. This is a follow up visit.     ASSESSMENT AND / RECOMMENDATIONS:   CODE STATUS: Discussion on the ramifications and implications of code status. Patient elects Full code.  Goals of Care: Goals include to maximize quality of life and symptom management Patient wishes for patient to get out in the community and meet with other people and engage in activities that engage her. PACE program and [employer] discussed with spouse and he said he would explore.  Symptom Management/Plan: Dementia: memory loss/confusion at baseline, incontinent of bladder FAST 6C. Continue to encourage reminiscence, word search/puzzles, cueing for recollection.  Promote calm approach and engaging environment.  Continue donepezil and memantine as currently ordered.  Continue ongoing supportive care Type 2 DM: Patient  saw PCP yesterday, current A1c is 9, trending up from 8.3 in Aug 2022. Trulicity dcd, now on Ozempic, humalog increased from 40 units to 50 units with breakfast daily. Education reiterated on no concentrated sweets. Diabetic diet, no empty caloric beverages.  Check CBG as ordered to help ensure tight control. Fu with Endocrinologist as planned.  Repeat A1c in 3 months.   Follow up: Palliative care will continue to follow for complex medical decision making, advance care planning, and clarification of goals. Return 6 weeks or prn. Encouraged to call provider sooner with any concerns.   Family /Caregiver/Community Supports: Patient lives at home with her spouse  HOSPICE ELIGIBILITY/DIAGNOSIS: TBD  Chief Complaint: Initial Palliative care visit  HISTORY OF PRESENT ILLNESS:  Angel Green is a 81 y.o. year old female  with multiple medical conditions including  Dementia with associated memory loss and confusion in line with Dementia disease trajectory, Type 2 DM,  HTN, CKD.  Patient denies pain/discomfort. Spouse main concern is how to keep patient engaged and this is further discussed today, resources provided, and all questions answered. History obtained from review of EMR, discussion with primary team, caregiver, family and/or Ms. Karren.  Review and summarization of Epic records shows history from other than patient. Rest of 10 point ROS asked and negative.  I reviewed as needed, available labs, patient records, imaging, studies and related documents from the EMR.  ROS General: NAD EYES: denies vision changes ENMT: denies dysphagia Cardiovascular: denies chest pain/discomfort Pulmonary: denies cough, denies SOB Abdomen: endorses good appetite, denies constipation/diarrhea GU: denies dysuria, urinary frequency MSK:  endorses weakness,  no falls reported Skin: denies rashes or wounds Neurological: denies pain, denies insomnia Psych: Endorses positive mood Heme/lymph/immuno: denies bruises, abnormal bleeding  Physical Exam: Height/Weight: 5 feet 5 inches/223 Ibs Constitutional: NAD General: Well groomed, cooperative EYES: anicteric sclera, lids intact, no discharge ,  wears eye glasses ENMT: Moist mucous membrane CV: S1 S2, RRR, no LE  edema Pulmonary: LCTA, no increased work of breathing, no cough, Abdomen: active BS + 4 quadrants, soft and non tender GU: no suprapubic tenderness MSK: weakness, ambulatory without assistive device Skin: warm and dry, no rashes or wounds on visible skin Neuro:  weakness, otherwise non focal, memory loss/confusion Psych: non-anxious affect Hem/lymph/immuno: no widespread bruising   PAST MEDICAL HISTORY:  Active Ambulatory Problems    Diagnosis Date Noted   Hyperlipidemia 01/25/2007   HTN (hypertension) 08/30/2006   Seasonal and perennial allergic rhinitis 10/20/2006   GERD 09/30/2008   OSTEOARTHROSIS, GENERALIZED, MULTIPLE SITES 08/30/2006   ANKLE PAIN, CHRONIC 12/08/2006   EDEMA 06/10/2008   CKD (chronic kidney disease) 04/16/2010   Fatigue 05/22/2010   Tracheobronchitis, chronic ([place]) 08/20/2010   Angioedema 10/22/2011   Annual physical exam 12/14/2011   Leg ulcer, left ([place]) 10/12/2013   PCP NOTES >>>>> 12/28/2014   Diabetes ([place]) 04/23/2015   Morbid obesity ([place]) 06/30/2015   Cognitive impairment 09/22/2016   Dementia ([place]) 08/14/2017   Numbness 03/26/2020   PAD (peripheral artery disease) ([place]) 04/21/2020   Resolved Ambulatory Problems    Diagnosis Date Noted   Bronchitis 06/03/2010   Dyspnea 10/12/2010   Cough 06/16/2011   Past Medical History:  Diagnosis Date   Allergy    Ankle pain    Diabetes mellitus, type 2 ([place])    Hypertension    Memory loss    Osteoarthritis    RENAL INSUFFICIENCY, CHRONIC 04/16/2010   Rhinitis     SOCIAL HX:  Social History   Tobacco Use   Smoking status: Never   Smokeless tobacco: Never  Substance Use Topics   Alcohol use: No    Alcohol/week: 0.0 standard drinks     FAMILY HX:  Family History  Problem Relation Age of Onset   Healthy Brother    Healthy Son    Healthy Son    Other Mother        unsure of history - "old age"   Other Father        unsure of history - "old age"   Heart attack Neg Hx    Colon cancer  Neg Hx    Breast cancer Neg Hx    Stroke Neg Hx       ALLERGIES:  Allergies  Allergen Reactions   Dust Mite Mixed Allergen Ext [Mite (D. Farinae)]       PERTINENT MEDICATIONS:  Outpatient Encounter Medications as of 03/06/2021  Medication Sig   amLODipine (NORVASC) 5 MG tablet TAKE 1 TABLET DAILY   Apoaequorin 20 MG CAPS Take 20 mg by mouth daily.   aspirin 81 MG tablet Take 81 mg by mouth daily.   atorvastatin (LIPITOR) 10 MG tablet TAKE 1 TABLET DAILY   Blood Glucose Monitoring Suppl (FREESTYLE LITE) DEVI Use to monitor your blood sugars   Continuous Blood Gluc Sensor (FREESTYLE LIBRE 2 SENSOR) MISC 1 Device by Does not apply route every 14 (fourteen) days.   Cyanocobalamin (B-12 PO) Take 1 tablet by mouth daily. Takes 5000 mcg by mouth daily.   donepezil (ARICEPT) 10 MG tablet TAKE 1 TABLET AT BEDTIME (PENDING APPOINTMENT 01/31/20)   DULoxetine (CYMBALTA) 20 MG capsule Take 1 capsule (20 mg total) by mouth daily.   EPINEPHrine 0.3 mg/0.3 mL IJ SOAJ injection Inject 0.3 mg into the muscle once. Reported on 06/30/2015   fluticasone (FLOVENT HFA) 44 MCG/ACT inhaler Inhale 2 puffs into the lungs  2 (two) times daily.   furosemide (LASIX) 20 MG tablet TAKE 2 TABLETS DAILY   glucose blood (FREESTYLE LITE) test strip Use to monitor your blood sugars 4 times per day; E11.9   Insulin Lispro Prot & Lispro (HUMALOG MIX 75/25 KWIKPEN) (75-25) 100 UNIT/ML Kwikpen Inject 50 Units into the skin daily with breakfast. And pen needles 1/day   Lancets (FREESTYLE) lancets Use to monitor blood sugars 4 times per day, once before each meal and once at bedtime   loratadine (CLARITIN) 10 MG tablet Take 1 tablet (10 mg total) by mouth daily.   losartan (COZAAR) 50 MG tablet TAKE 1 TABLET DAILY   memantine (NAMENDA) 10 MG tablet Take 1 tablet (10 mg total) by mouth 2 (two) times daily.   Multiple Vitamins-Minerals (CENTRUM SILVER PO) Take 1 tablet by mouth daily.   omeprazole (PRILOSEC) 20 MG capsule Take 1  capsule (20 mg total) by mouth daily.   Polyethyl Glycol-Propyl Glycol 0.4-0.3 % SOLN Apply 1 drop to eye daily as needed (for dry eyes).   potassium chloride (KLOR-CON) 10 MEQ tablet TAKE 1 TABLET DAILY   QUEtiapine (SEROQUEL) 25 MG tablet Take 1 tablet (25 mg total) by mouth at bedtime.   Semaglutide, 2 MG/DOSE, (OZEMPIC, 2 MG/DOSE,) 8 MG/3ML SOPN Inject 2 mg into the skin once a week.   SURE COMFORT PEN NEEDLES 31G X 5 MM MISC Use to inject insulin 1 time per day.   No facility-administered encounter medications on file as of 03/06/2021.  I spent 40 minutes providing this consultation; time includes spent with patient/family, chart review and documentation. More than 50% of the time in this consultation was spent on care coordination Thank you for the opportunity to participate in the care of Ms. Cliett.  The palliative care team will continue to follow. Please call our office at [phone] if we can be of additional assistance.   Note: Portions of this note were generated with [employer]. Dictation errors may occur despite best attempts at proofreading.  Teodoro Spray, NP

## 2021-03-09 ENCOUNTER — Ambulatory Visit: Payer: Medicare Other | Admitting: Adult Health

## 2021-03-10 ENCOUNTER — Other Ambulatory Visit: Payer: Self-pay

## 2021-03-10 ENCOUNTER — Encounter: Payer: Self-pay | Admitting: Adult Health

## 2021-03-10 ENCOUNTER — Ambulatory Visit (INDEPENDENT_AMBULATORY_CARE_PROVIDER_SITE_OTHER): Payer: Medicare Other | Admitting: Adult Health

## 2021-03-10 VITALS — BP 140/89 | HR 94 | Wt 226.0 lb

## 2021-03-10 DIAGNOSIS — F0390 Unspecified dementia without behavioral disturbance: Secondary | ICD-10-CM

## 2021-03-10 NOTE — Progress Notes (Signed)
[facility] [address] [place]. [place] [address] [phone]       OFFICE FOLLOW UP NOTE  [name] Date of Birth:  02/14/1941 Medical Record Number:  [id number]   Reason for visit: Dementia    SUBJECTIVE:   CHIEF COMPLAINT:  Chief Complaint  Patient presents with   Follow-up    RM 3 with spouse [name] Pt is well and stable, spouse states things are fine, no worsening or new concerns     HPI:   Angel Green is a 81 year old female with underlying medical history of diabetes, chronic renal insufficiency, HTN and dementia.  She was initially evaluated by Dr. Krista Blue on 10/18/2017 for dementia with behavioral disturbance.    Today, 03/10/2021, patient returns for 6 month follow up accompanied by her husband.  Overall stable - denies worsening since prior visit.  Remains on Aricept, Namenda and Seroquel without side effects.  Denies any behavioral concerns.  Sleeping well at night. Good appetite.  Husband primary caregiver.  Routinely follows with PCP.  Husband does report occasional leg twitching while sleeping - unsure if both legs or one specific leg. No other concerns at this time.      ROS:   N/A d/t dementia   PMH:  Past Medical History:  Diagnosis Date   Allergy    Dust Mites   Ankle pain    chronic   Diabetes mellitus, type 2 ([place])    Hyperlipidemia    Hypertension    Memory loss    Osteoarthritis    RENAL INSUFFICIENCY, CHRONIC 04/16/2010   Rhinitis    allergic nos    PSH:  Past Surgical History:  Procedure Laterality Date   CATARACT EXTRACTION     CHOLECYSTECTOMY     dental implants     PARTIAL HYSTERECTOMY     in her 14s    Social History:  Social History   Socioeconomic History   Marital status: Married    Spouse name: Not on file   Number of children: 2   Years of education: some college   Highest education level: Not on file  Occupational History   Occupation:  retired  Tobacco Use   Smoking status:  Never   Smokeless tobacco: Never  Vaping Use   Vaping Use: Never used  Substance and Sexual Activity   Alcohol use: No    Alcohol/week: 0.0 standard drinks   Drug use: No   Sexual activity: Yes    Partners: Male  Other Topics Concern   Not on file  Social History Narrative   Husband is a Company secretary.   Right-handed.   1 cup caffeine daily.   Lives at home with husband.          Social Determinants of Health   Financial Resource Strain: Not on file  Food Insecurity: Not on file  Transportation Needs: Not on file  Physical Activity: Not on file  Stress: Not on file  Social Connections: Not on file  Intimate Partner Violence: Not on file    Family History:  Family History  Problem Relation Age of Onset   Healthy Brother    Healthy Son    Healthy Son    Other Mother        unsure of history - "old age"   Other Father        unsure of history - "old age"   Heart attack Neg Hx    Colon cancer Neg Hx    Breast cancer  Neg Hx    Stroke Neg Hx     Medications:   Current Outpatient Medications on File Prior to Visit  Medication Sig Dispense Refill   amLODipine (NORVASC) 5 MG tablet TAKE 1 TABLET DAILY 90 tablet 1   Apoaequorin 20 MG CAPS Take 20 mg by mouth daily.     aspirin 81 MG tablet Take 81 mg by mouth daily.     atorvastatin (LIPITOR) 10 MG tablet TAKE 1 TABLET DAILY 90 tablet 1   Blood Glucose Monitoring Suppl (FREESTYLE LITE) DEVI Use to monitor your blood sugars 1 each 0   Continuous Blood Gluc Sensor (FREESTYLE LIBRE 2 SENSOR) MISC 1 Device by Does not apply route every 14 (fourteen) days. 6 each 3   Cyanocobalamin (B-12 PO) Take 1 tablet by mouth daily. Takes 5000 mcg by mouth daily.     donepezil (ARICEPT) 10 MG tablet TAKE 1 TABLET AT BEDTIME (PENDING APPOINTMENT 01/31/20) 90 tablet 3   DULoxetine (CYMBALTA) 20 MG capsule Take 1 capsule (20 mg total) by mouth daily. 90 capsule 1   EPINEPHrine 0.3 mg/0.3 mL IJ SOAJ injection Inject 0.3 mg into the muscle once.  Reported on 06/30/2015     fluticasone (FLOVENT HFA) 44 MCG/ACT inhaler Inhale 2 puffs into the lungs 2 (two) times daily. 3 Inhaler 3   furosemide (LASIX) 20 MG tablet TAKE 2 TABLETS DAILY 180 tablet 1   glucose blood (FREESTYLE LITE) test strip Use to monitor your blood sugars 4 times per day; E11.9 400 each 12   Insulin Lispro Prot & Lispro (HUMALOG MIX 75/25 KWIKPEN) (75-25) 100 UNIT/ML Kwikpen Inject 50 Units into the skin daily with breakfast. And pen needles 1/day 60 mL 3   Lancets (FREESTYLE) lancets Use to monitor blood sugars 4 times per day, once before each meal and once at bedtime 400 each 12   loratadine (CLARITIN) 10 MG tablet Take 1 tablet (10 mg total) by mouth daily. 90 tablet 3   losartan (COZAAR) 50 MG tablet TAKE 1 TABLET DAILY 90 tablet 1   memantine (NAMENDA) 10 MG tablet Take 1 tablet (10 mg total) by mouth 2 (two) times daily. 180 tablet 3   Multiple Vitamins-Minerals (CENTRUM SILVER PO) Take 1 tablet by mouth daily.     omeprazole (PRILOSEC) 20 MG capsule Take 1 capsule (20 mg total) by mouth daily. 90 capsule 3   Polyethyl Glycol-Propyl Glycol 0.4-0.3 % SOLN Apply 1 drop to eye daily as needed (for dry eyes).     potassium chloride (KLOR-CON) 10 MEQ tablet TAKE 1 TABLET DAILY 90 tablet 1   QUEtiapine (SEROQUEL) 25 MG tablet Take 1 tablet (25 mg total) by mouth at bedtime. 90 tablet 3   Semaglutide, 2 MG/DOSE, (OZEMPIC, 2 MG/DOSE,) 8 MG/3ML SOPN Inject 2 mg into the skin once a week. 9 mL 3   SURE COMFORT PEN NEEDLES 31G X 5 MM MISC Use to inject insulin 1 time per day. 100 each 2   No current facility-administered medications on file prior to visit.    Allergies:   Allergies  Allergen Reactions   Dust Mite Mixed Allergen Ext [Mite (D. Farinae)]       OBJECTIVE:  Physical Exam  Vitals:   03/10/21 1245  BP: 140/89  Pulse: 94  Weight: 226 lb (102.5 kg)     Body mass index is 37.61 kg/m. No results found.  General: well developed, well nourished, very  pleasant elderly African-American female, seated, in no evident distress Head: head normocephalic  and atraumatic.   Neck: supple with no carotid or supraclavicular bruits Cardiovascular: regular rate and rhythm, no murmurs Musculoskeletal: no deformity Skin:  no rash/petichiae Vascular:  Normal pulses all extremities   Neurologic Exam Mental Status: Awake and fully alert.   Husband provides majority of today's history.  Mood and affect appropriate.   MMSE - Mini Mental State Exam 06/17/2020 01/31/2020 08/09/2019 11/14/2018 04/20/2018 11/11/2017 10/18/2017  Not completed: - - - Unable to complete - - -  Orientation to time 0 0 1 - 3 3 1   Orientation to Place 0 0 3 - 4 5 4   Registration 3 3 3  - 3 3 3   Attention/ Calculation 0 0 0 - 2 2 2   Recall 0 0 0 - 0 3 0  Language- name 2 objects 2 2 2  - 2 2 2   Language- repeat 1 1 1  - 1 1 1   Language- follow 3 step command 3 0 3 - 3 0 3  Language- read & follow direction 1 0 1 - 1 1 1   Write a sentence 0 0 1 - 1 1 1   Copy design 0 0 0 - 1 0 1  Copy design-comments - - 4 animals - - - -  Total score 10 6 15  - 21 21 19    Cranial Nerves: Pupils equal, briskly reactive to light. Extraocular movements full without nystagmus. Visual fields full to confrontation. Hearing intact. Facial sensation intact. Face, tongue, palate moves normally and symmetrically.  Motor: Normal bulk and tone. Normal strength in all tested extremity muscles. Sensory.: intact to touch , pinprick , position and vibratory sensation.  Coordination: Rapid alternating movements normal in all extremities. Finger-to-nose and heel-to-shin performed accurately bilaterally. Gait and Station: Arises from chair without difficulty. Stance is normal. Gait demonstrates normal stride length and balance without use of assistive device Reflexes: 1+ and symmetric. Toes downgoing.       ASSESSMENT/PLAN: Angel Green is a 81 y.o. year old female with underlying medical history of DM, HTN, chronic  renal dysfunction and dementia.  Initially evaluated by Dr Krista Blue in 2019 with prior visit 06/2018.     -Cognition has been stable - PCP completed MMSE 05/2020 10/30 with prior testing 6/30 (02/2020); can hold off on repeat testing as stable - would recommend repeat testing around 05/2021 -continue Namenda, Aricept and Seroquel - request ongoing monitoring and management to PCP -advised to f/u with PCP regarding leg twitching while sleeping    Stable - no further recommendations - follow up as needed   CC:  Colon Branch, MD     I spent 29 minutes of face-to-face and non-face-to-face time with patient and husband.  This included previsit chart review, lab review, study review, electronic health record documentation, patient and husband discussion and education regarding dementia and ongoing use of medications and answered all other questions to patient and husband satisfaction  Frann Rider, AGNP-BC  [facility] [address] [place],  [address]  Phone [phone] Fax [phone] Note: This document was prepared with digital dictation and possible smart phrase technology. Any transcriptional errors that result from this process are unintentional.

## 2021-03-29 ENCOUNTER — Other Ambulatory Visit: Payer: Self-pay | Admitting: Internal Medicine

## 2021-03-31 ENCOUNTER — Encounter: Payer: Self-pay | Admitting: Family Medicine

## 2021-03-31 ENCOUNTER — Ambulatory Visit (INDEPENDENT_AMBULATORY_CARE_PROVIDER_SITE_OTHER): Payer: Medicare Other | Admitting: Family Medicine

## 2021-03-31 VITALS — BP 152/79 | HR 95 | Temp 98.3°F | Ht 65.0 in | Wt 227.4 lb

## 2021-03-31 DIAGNOSIS — L858 Other specified epidermal thickening: Secondary | ICD-10-CM

## 2021-03-31 DIAGNOSIS — R61 Generalized hyperhidrosis: Secondary | ICD-10-CM | POA: Diagnosis not present

## 2021-03-31 NOTE — Progress Notes (Signed)
Acute Office Visit  Subjective:    Patient ID: Angel Green, female    DOB: June 22, 1940, 81 y.o.   MRN: [id number]  CC: chills, night sweats    HPI Patient is in today for recent night sweats.   For the past several days patient has been cold all during the day and night but wakes up with her clothes wet from sweating.  Has to change the sheets/pillowcases in the morning.  Denies any other symptoms.  No fever, cough, rhinorrhea, sneezing, dyspnea, headaches, dysuria, changes in urine, worsening confusion, sleep disturbance, rashes, fatigue.  Husband here to help during visit as patient has baseline dementia.  Husband also reports she has a bump on her nose that she keeps picking at that he would like me to check.     Past Medical History:  Diagnosis Date   Allergy    Dust Mites   Ankle pain    chronic   Diabetes mellitus, type 2 ([place])    Hyperlipidemia    Hypertension    Memory loss    Osteoarthritis    RENAL INSUFFICIENCY, CHRONIC 04/16/2010   Rhinitis    allergic nos    Past Surgical History:  Procedure Laterality Date   CATARACT EXTRACTION     CHOLECYSTECTOMY     dental implants     PARTIAL HYSTERECTOMY     in her 39s    Family History  Problem Relation Age of Onset   Healthy Brother    Healthy Son    Healthy Son    Other Mother        unsure of history - "old age"   Other Father        unsure of history - "old age"   Heart attack Neg Hx    Colon cancer Neg Hx    Breast cancer Neg Hx    Stroke Neg Hx     Social History   Socioeconomic History   Marital status: Married    Spouse name: Not on file   Number of children: 2   Years of education: some college   Highest education level: Not on file  Occupational History   Occupation:  retired  Tobacco Use   Smoking status: Never   Smokeless tobacco: Never  Vaping Use   Vaping Use: Never used  Substance and Sexual Activity   Alcohol use: No    Alcohol/week: 0.0 standard drinks   Drug use:  No   Sexual activity: Yes    Partners: Male  Other Topics Concern   Not on file  Social History Narrative   Husband is a Company secretary.   Right-handed.   1 cup caffeine daily.   Lives at home with husband.          Social Determinants of Health   Financial Resource Strain: Not on file  Food Insecurity: Not on file  Transportation Needs: Not on file  Physical Activity: Not on file  Stress: Not on file  Social Connections: Not on file  Intimate Partner Violence: Not on file    Outpatient Medications Prior to Visit  Medication Sig Dispense Refill   amLODipine (NORVASC) 5 MG tablet TAKE 1 TABLET DAILY 90 tablet 1   Apoaequorin 20 MG CAPS Take 20 mg by mouth daily.     aspirin 81 MG tablet Take 81 mg by mouth daily.     atorvastatin (LIPITOR) 10 MG tablet TAKE 1 TABLET DAILY 90 tablet 1   Blood Glucose Monitoring Suppl (FREESTYLE LITE)  DEVI Use to monitor your blood sugars 1 each 0   Continuous Blood Gluc Sensor (FREESTYLE LIBRE 2 SENSOR) MISC 1 Device by Does not apply route every 14 (fourteen) days. 6 each 3   Cyanocobalamin (B-12 PO) Take 1 tablet by mouth daily. Takes 5000 mcg by mouth daily.     donepezil (ARICEPT) 10 MG tablet TAKE 1 TABLET AT BEDTIME (PENDING APPOINTMENT 01/31/20) 90 tablet 3   DULoxetine (CYMBALTA) 20 MG capsule Take 1 capsule (20 mg total) by mouth daily. 90 capsule 1   EPINEPHrine 0.3 mg/0.3 mL IJ SOAJ injection Inject 0.3 mg into the muscle once. Reported on 06/30/2015     fluticasone (FLOVENT HFA) 44 MCG/ACT inhaler Inhale 2 puffs into the lungs 2 (two) times daily. 3 Inhaler 3   furosemide (LASIX) 20 MG tablet TAKE 2 TABLETS DAILY 180 tablet 1   glucose blood (FREESTYLE LITE) test strip Use to monitor your blood sugars 4 times per day; E11.9 400 each 12   Insulin Lispro Prot & Lispro (HUMALOG MIX 75/25 KWIKPEN) (75-25) 100 UNIT/ML Kwikpen Inject 50 Units into the skin daily with breakfast. And pen needles 1/day 60 mL 3   Lancets (FREESTYLE) lancets Use to  monitor blood sugars 4 times per day, once before each meal and once at bedtime 400 each 12   loratadine (CLARITIN) 10 MG tablet TAKE 1 TABLET DAILY 90 tablet 3   losartan (COZAAR) 50 MG tablet TAKE 1 TABLET DAILY 90 tablet 1   memantine (NAMENDA) 10 MG tablet Take 1 tablet (10 mg total) by mouth 2 (two) times daily. 180 tablet 3   Multiple Vitamins-Minerals (CENTRUM SILVER PO) Take 1 tablet by mouth daily.     omeprazole (PRILOSEC) 20 MG capsule Take 1 capsule (20 mg total) by mouth daily. 90 capsule 3   Polyethyl Glycol-Propyl Glycol 0.4-0.3 % SOLN Apply 1 drop to eye daily as needed (for dry eyes).     potassium chloride (KLOR-CON) 10 MEQ tablet TAKE 1 TABLET DAILY 90 tablet 1   QUEtiapine (SEROQUEL) 25 MG tablet Take 1 tablet (25 mg total) by mouth at bedtime. 90 tablet 3   Semaglutide, 2 MG/DOSE, (OZEMPIC, 2 MG/DOSE,) 8 MG/3ML SOPN Inject 2 mg into the skin once a week. 9 mL 3   SURE COMFORT PEN NEEDLES 31G X 5 MM MISC Use to inject insulin 1 time per day. 100 each 2   No facility-administered medications prior to visit.    Allergies  Allergen Reactions   Dust Mite Mixed Allergen Ext [Mite (D. Farinae)]     Review of Systems All review of systems negative except what is listed in the HPI     Objective:    Physical Exam Vitals reviewed.  Constitutional:      Appearance: Normal appearance.  HENT:     Head: Normocephalic and atraumatic.     Right Ear: Tympanic membrane normal.     Left Ear: Tympanic membrane normal.     Nose: Nose normal.     Comments: Cutaneous horn to nose, see picture    Mouth/Throat:     Mouth: Mucous membranes are moist.     Pharynx: Oropharynx is clear.  Cardiovascular:     Rate and Rhythm: Normal rate and regular rhythm.  Pulmonary:     Effort: Pulmonary effort is normal.     Breath sounds: Normal breath sounds. No wheezing, rhonchi or rales.  Abdominal:     General: Abdomen is flat. Bowel sounds are normal.  Palpations: Abdomen is soft.   Skin:    General: Skin is warm and dry.  Neurological:     Mental Status: She is alert. Mental status is at baseline. She is disoriented.  Psychiatric:        Mood and Affect: Mood normal.        Behavior: Behavior normal.      BP (!) 152/79    Pulse 95    Temp 98.3 F (36.8 C)    Ht 5\' 5"  (1.651 m)    Wt 227 lb 6.4 oz (103.1 kg)    SpO2 97%    BMI 37.84 kg/m  Wt Readings from Last 3 Encounters:  03/31/21 227 lb 6.4 oz (103.1 kg)  03/10/21 226 lb (102.5 kg)  03/04/21 224 lb 3.2 oz (101.7 kg)    Health Maintenance Due  Topic Date Due   COVID-19 Vaccine (5 - Booster for Moderna series) 07/18/2020   OPHTHALMOLOGY EXAM  12/19/2020    There are no preventive care reminders to display for this patient.   Lab Results  Component Value Date   TSH 1.34 09/08/2020   Lab Results  Component Value Date   WBC 7.1 09/08/2020   HGB 13.8 09/08/2020   HCT 41.5 09/08/2020   MCV 97.8 09/08/2020   PLT 216.0 09/08/2020   Lab Results  Component Value Date   NA 137 12/18/2020   K 3.9 12/18/2020   CO2 32 12/18/2020   GLUCOSE 379 (H) 12/18/2020   BUN 14 12/18/2020   CREATININE 1.59 (H) 12/18/2020   BILITOT 0.4 07/15/2020   ALKPHOS 70 07/15/2020   AST 18 07/15/2020   ALT 15 07/15/2020   PROT 7.3 07/15/2020   ALBUMIN 4.0 07/15/2020   CALCIUM 9.7 12/18/2020   ANIONGAP 13 04/04/2017   GFR 30.02 (L) 12/18/2020   Lab Results  Component Value Date   CHOL 134 06/17/2020   Lab Results  Component Value Date   HDL 49.50 06/17/2020   Lab Results  Component Value Date   LDLCALC 62 06/17/2020   Lab Results  Component Value Date   TRIG 114.0 06/17/2020   Lab Results  Component Value Date   CHOLHDL 3 06/17/2020   Lab Results  Component Value Date   HGBA1C 9.1 (A) 03/04/2021       Assessment & Plan:   1. Night sweats No red flags on exam. No other symptoms. Checking blood work and urine today. Will update them with results and any changes to plan.  - CBC -  Comprehensive metabolic panel - TSH - Urinalysis  2. Cutaneous horn Benign lesion. Patient/husband would like to have removed.  Procedure: Cutaneous horn removal Informed consent:  Discussed risks (permanent scarring, infection, pain, bleeding, bruising, redness, and recurrence of the lesion) and benefits of the procedure, as well as the alternatives.  She is aware that horns are benign lesions, and their removal is often not considered medically necessary.  Informed consent was obtained. Anesthesia: cold spray  The area was prepared and draped in a standard fashion. Snip removal was performed.   Antibiotic ointment and a sterile dressing were applied.   The patient tolerated procedure well. The patient was instructed on post-op care.   Number of lesions removed:  1   Follow-up pending results or as needed.    Terrilyn Saver, NP

## 2021-03-31 NOTE — Patient Instructions (Signed)
Labs and urine sample. Please let us know if any new symptoms develop.  For your nose: Keep clean, use neosporin and a bandaid until area is healed over.   Please contact office for follow-up if symptoms do not improve or worsen. Seek emergency care if symptoms become severe.

## 2021-04-01 LAB — COMPREHENSIVE METABOLIC PANEL
ALT: 23 U/L (ref 0–35)
AST: 23 U/L (ref 0–37)
Albumin: 3.8 g/dL (ref 3.5–5.2)
Alkaline Phosphatase: 75 U/L (ref 39–117)
BUN: 14 mg/dL (ref 6–23)
CO2: 28 mEq/L (ref 19–32)
Calcium: 9.9 mg/dL (ref 8.4–10.5)
Chloride: 100 mEq/L (ref 96–112)
Creatinine, Ser: 1.46 mg/dL — ABNORMAL HIGH (ref 0.40–1.20)
GFR: 33.19 mL/min — ABNORMAL LOW (ref >60.00)
Glucose, Bld: 106 mg/dL — ABNORMAL HIGH (ref 70–99)
Potassium: 4.2 mEq/L (ref 3.5–5.1)
Sodium: 141 mEq/L (ref 135–145)
Total Bilirubin: 0.3 mg/dL (ref 0.2–1.2)
Total Protein: 7.2 g/dL (ref 6.0–8.3)

## 2021-04-01 LAB — CBC
HCT: 42.9 % (ref 36.0–46.0)
Hemoglobin: 14 g/dL (ref 12.0–15.0)
MCHC: 32.6 g/dL (ref 30.0–36.0)
MCV: 98.1 fl (ref 78.0–100.0)
Platelets: 239 10*3/uL (ref 150.0–400.0)
RBC: 4.37 Mil/uL (ref 3.87–5.11)
RDW: 14.6 % (ref 11.5–15.5)
WBC: 8.4 10*3/uL (ref 4.0–10.5)

## 2021-04-01 LAB — URINALYSIS
Bilirubin Urine: NEGATIVE
Hgb urine dipstick: NEGATIVE
Ketones, ur: NEGATIVE
Leukocytes,Ua: NEGATIVE
Nitrite: NEGATIVE
Specific Gravity, Urine: 1.02 (ref 1.000–1.030)
Total Protein, Urine: NEGATIVE
Urine Glucose: NEGATIVE
Urobilinogen, UA: 0.2 (ref 0.0–1.0)
pH: 5.5 (ref 5.0–8.0)

## 2021-04-01 LAB — TSH: TSH: 1.19 u[IU]/mL (ref 0.35–5.50)

## 2021-04-08 ENCOUNTER — Other Ambulatory Visit: Payer: Self-pay | Admitting: Endocrinology

## 2021-04-08 ENCOUNTER — Telehealth: Payer: Self-pay

## 2021-04-08 MED ORDER — TRULICITY 3 MG/0.5ML ~~LOC~~ SOAJ: SUBCUTANEOUS | 3 refills | Status: DC

## 2021-04-08 MED ORDER — TRULICITY 4.5 MG/0.5ML ~~LOC~~ SOAJ: 4.5000 mg | SUBCUTANEOUS | 3 refills | Status: DC

## 2021-04-08 NOTE — Telephone Encounter (Signed)
Called patient's mail order [facility] and they have these alternatives to Ozempic:   Trulicity 1.64 MG as well as Trulicity 3.0 available. They currently aren't sure when they'll have 1.5 MG available.

## 2021-04-08 NOTE — Telephone Encounter (Signed)
Patient's husband came in office to discuss his wife's medication. [facility] currently do not have Ozempic 2 MG and would like to know if theres an alternative that can be sent in as soon as possible. Please advise and send alternative medication to [facility]  [phone]

## 2021-04-08 NOTE — Telephone Encounter (Signed)
Patient's spouse has now been informed of the alternative Trulicity instead of Ozempic being sent in as it were available. Patient's spouse expressed understanding

## 2021-04-09 ENCOUNTER — Telehealth: Payer: Self-pay

## 2021-04-09 NOTE — Telephone Encounter (Addendum)
[facility] call\ed and needed clarification on Trulicity. She stated that they have a script for the 3mg  and the 4.5mg  of Trulicity and if they are on the trulicity, it was written for every 5 days and usually it is every 7 days.  Now, looking back in the notes, I see that you stated to change the Trulicity to Ozempic.  Please clarify  [phone]  Ref# [id number]

## 2021-04-10 ENCOUNTER — Telehealth: Payer: Self-pay

## 2021-04-10 ENCOUNTER — Other Ambulatory Visit: Payer: Self-pay | Admitting: Endocrinology

## 2021-04-10 MED ORDER — TRULICITY 1.5 MG/0.5ML ~~LOC~~ SOAJ: 4.5000 mg | SUBCUTANEOUS | 3 refills | Status: DC

## 2021-04-10 NOTE — Telephone Encounter (Signed)
I LVM for [name] to give me a cb to the office to see what can we do in regards to [name]) medication Trulicity.  I also spoke with Lovey Newcomer over at [facility] regarding pt's medication to find out what they are needing for [place] to make sure that we accommodate the pt with there prescribed medication. Lovey Newcomer stated that the 3mg /4.5mg  of Trulicity are unavailable. The 3mg  want become available until after 04/13/21 but there is a 10 day waiting period for them to receive the shipment and then get it into there system and this is the same process with the 4.5mg  which will not become available until after 04/21/2021. Ozempic 2mg  is still not available and is intermitted backorder meaning when it comes in it goes out just as fast and does not have a release date. Now, what they do have available is the 0.5, 0.75, and the 1.5mg  of Trulicity.  I also received a call from [place] in the mist of typing this message and I explained to him the process of the above message. I told him that I will speak with [place] to see what he would like to do and I will F/U with him on what has been decided from [place].

## 2021-04-10 NOTE — Telephone Encounter (Signed)
Message has been sent to [facility] regarding this.

## 2021-04-10 NOTE — Telephone Encounter (Signed)
Attempted to contact the patient to inform of the prescription being sent for 3x1.5mg /week. received no answer. LVM

## 2021-04-13 NOTE — Telephone Encounter (Signed)
I have LVM for husband to let him know that [facility] sent in another Rx dosage amt for pt.

## 2021-04-16 ENCOUNTER — Other Ambulatory Visit: Payer: Self-pay

## 2021-04-16 ENCOUNTER — Ambulatory Visit (INDEPENDENT_AMBULATORY_CARE_PROVIDER_SITE_OTHER): Payer: Medicare Other | Admitting: Endocrinology

## 2021-04-16 VITALS — BP 138/60 | HR 102 | Ht 65.0 in | Wt 229.6 lb

## 2021-04-16 DIAGNOSIS — E119 Type 2 diabetes mellitus without complications: Secondary | ICD-10-CM | POA: Diagnosis not present

## 2021-04-16 MED ORDER — INSULIN LISPRO PROT & LISPRO (75-25 MIX) 100 UNIT/ML KWIKPEN: 55.0000 [IU] | PEN_INJECTOR | Freq: Every day | SUBCUTANEOUS | 3 refills | Status: DC

## 2021-04-16 NOTE — Progress Notes (Signed)
Subjective:    Patient ID: Angel Green, female    DOB: August 31, 1940, 81 y.o.   MRN: [id number]  HPI Pt returns for f/u of diabetes mellitus: DM type: Insulin-requiring type 2.   Dx'ed: 4098 Complications: PN, PAD, and CRI.   Therapy: insulin since 1191, and Trulicity.   GDM: never.  DKA: never.   Severe hypoglycemia: once, in 2019.   Pancreatitis: never.  SDOH: rx is limited by memory loss; husband sets pen to # of units, and pt administers.   Other: she changed to QAM premixed insulin, after poor results with multiple daily injections; pattern of cbg's indicates she does not need a PM dose; fructosamine converts to slightly higher A1c than A1c itself.  Interval history: no cbg record, but husband states cbg's are in the 200's.  No recent steroids.  She is having trouble getting Ozempic, or Trulicity 4.5 mg.  Pharmacy had trulicity to mg available, so I rx'ed it every 5 days, to be similar to 4.5 mg weekly.  Then pharmacy then said this was an inappropriate dosing interval, so I changed to 3x1.5 mg weekly.  pt is now taking 3 mg weekly.  Pt was unable to get continuous glucose monitor.   Past Medical History:  Diagnosis Date   Allergy    Dust Mites   Ankle pain    chronic   Diabetes mellitus, type 2 ([place])    Hyperlipidemia    Hypertension    Memory loss    Osteoarthritis    RENAL INSUFFICIENCY, CHRONIC 04/16/2010   Rhinitis    allergic nos    Past Surgical History:  Procedure Laterality Date   CATARACT EXTRACTION     CHOLECYSTECTOMY     dental implants     PARTIAL HYSTERECTOMY     in her 42s    Social History   Socioeconomic History   Marital status: Married    Spouse name: Not on file   Number of children: 2   Years of education: some college   Highest education level: Not on file  Occupational History   Occupation:  retired  Tobacco Use   Smoking status: Never   Smokeless tobacco: Never  Vaping Use   Vaping Use: Never used  Substance and Sexual Activity    Alcohol use: No    Alcohol/week: 0.0 standard drinks   Drug use: No   Sexual activity: Yes    Partners: Male  Other Topics Concern   Not on file  Social History Narrative   Husband is a Company secretary.   Right-handed.   1 cup caffeine daily.   Lives at home with husband.          Social Determinants of Health   Financial Resource Strain: Not on file  Food Insecurity: Not on file  Transportation Needs: Not on file  Physical Activity: Not on file  Stress: Not on file  Social Connections: Not on file  Intimate Partner Violence: Not on file    Current Outpatient Medications on File Prior to Visit  Medication Sig Dispense Refill   amLODipine (NORVASC) 5 MG tablet TAKE 1 TABLET DAILY 90 tablet 1   Apoaequorin 20 MG CAPS Take 20 mg by mouth daily.     aspirin 81 MG tablet Take 81 mg by mouth daily.     atorvastatin (LIPITOR) 10 MG tablet TAKE 1 TABLET DAILY 90 tablet 1   Blood Glucose Monitoring Suppl (FREESTYLE LITE) DEVI Use to monitor your blood sugars 1 each 0  Continuous Blood Gluc Sensor (FREESTYLE LIBRE 2 SENSOR) MISC 1 Device by Does not apply route every 14 (fourteen) days. 6 each 3   Cyanocobalamin (B-12 PO) Take 1 tablet by mouth daily. Takes 5000 mcg by mouth daily.     donepezil (ARICEPT) 10 MG tablet TAKE 1 TABLET AT BEDTIME (PENDING APPOINTMENT 01/31/20) 90 tablet 3   Dulaglutide (TRULICITY) 1.5 NK/5.3ZJ SOPN Inject 4.5 mg into the skin once a week. 18 mL 3   DULoxetine (CYMBALTA) 20 MG capsule Take 1 capsule (20 mg total) by mouth daily. 90 capsule 1   EPINEPHrine 0.3 mg/0.3 mL IJ SOAJ injection Inject 0.3 mg into the muscle once. Reported on 06/30/2015     fluticasone (FLOVENT HFA) 44 MCG/ACT inhaler Inhale 2 puffs into the lungs 2 (two) times daily. 3 Inhaler 3   furosemide (LASIX) 20 MG tablet TAKE 2 TABLETS DAILY 180 tablet 1   glucose blood (FREESTYLE LITE) test strip Use to monitor your blood sugars 4 times per day; E11.9 400 each 12   Lancets (FREESTYLE) lancets  Use to monitor blood sugars 4 times per day, once before each meal and once at bedtime 400 each 12   loratadine (CLARITIN) 10 MG tablet TAKE 1 TABLET DAILY 90 tablet 3   losartan (COZAAR) 50 MG tablet TAKE 1 TABLET DAILY 90 tablet 1   memantine (NAMENDA) 10 MG tablet Take 1 tablet (10 mg total) by mouth 2 (two) times daily. 180 tablet 3   Multiple Vitamins-Minerals (CENTRUM SILVER PO) Take 1 tablet by mouth daily.     omeprazole (PRILOSEC) 20 MG capsule Take 1 capsule (20 mg total) by mouth daily. 90 capsule 3   Polyethyl Glycol-Propyl Glycol 0.4-0.3 % SOLN Apply 1 drop to eye daily as needed (for dry eyes).     potassium chloride (KLOR-CON) 10 MEQ tablet TAKE 1 TABLET DAILY 90 tablet 1   QUEtiapine (SEROQUEL) 25 MG tablet Take 1 tablet (25 mg total) by mouth at bedtime. 90 tablet 3   SURE COMFORT PEN NEEDLES 31G X 5 MM MISC Use to inject insulin 1 time per day. 100 each 2   No current facility-administered medications on file prior to visit.    Allergies  Allergen Reactions   Dust Mite Mixed Allergen Ext [Mite (D. Farinae)]     Family History  Problem Relation Age of Onset   Healthy Brother    Healthy Son    Healthy Son    Other Mother        unsure of history - "old age"   Other Father        unsure of history - "old age"   Heart attack Neg Hx    Colon cancer Neg Hx    Breast cancer Neg Hx    Stroke Neg Hx     BP 138/60    Pulse (!) 102    Ht 5\' 5"  (1.651 m)    Wt 229 lb 9.6 oz (104.1 kg)    SpO2 98%    BMI 38.21 kg/m    Review of Systems Denies N/HB.      Objective:   Physical Exam VITAL SIGNS:  See vs page.   GENERAL: no distress.         Assessment & Plan:  Insulin-requiring type 2 DM: uncontrolled.    Patient Instructions  I have sent 2 prescription to your pharmacy, to increase the insulin to 55 units with breakfast.  Please continue the same Trulicity for now.  check your  blood sugar twice a day.  vary the time of day when you check, between before the 3  meals, and at bedtime.  also check if you have symptoms of your blood sugar being too high or too low.  please keep a record of the readings and bring it to your next appointment here (or you can bring the meter itself).  You can write it on any piece of paper.  please call us sooner if your blood sugar goes below 70, or if you have a lot of readings over 200.   Please come back for a follow-up appointment in 1 month.

## 2021-04-16 NOTE — Patient Instructions (Addendum)
I have sent 2 prescription to your pharmacy, to increase the insulin to 55 units with breakfast.  Please continue the same Trulicity for now.  check your blood sugar twice a day.  vary the time of day when you check, between before the 3 meals, and at bedtime.  also check if you have symptoms of your blood sugar being too high or too low.  please keep a record of the readings and bring it to your next appointment here (or you can bring the meter itself).  You can write it on any piece of paper.  please call us sooner if your blood sugar goes below 70, or if you have a lot of readings over 200.   Please come back for a follow-up appointment in 1 month.

## 2021-04-20 ENCOUNTER — Encounter: Payer: Self-pay | Admitting: Internal Medicine

## 2021-04-20 ENCOUNTER — Other Ambulatory Visit: Payer: Self-pay

## 2021-04-20 ENCOUNTER — Ambulatory Visit (INDEPENDENT_AMBULATORY_CARE_PROVIDER_SITE_OTHER): Payer: Medicare Other | Admitting: Internal Medicine

## 2021-04-20 VITALS — BP 126/70 | HR 101 | Temp 97.9°F | Resp 18 | Ht 65.0 in | Wt 228.0 lb

## 2021-04-20 DIAGNOSIS — L989 Disorder of the skin and subcutaneous tissue, unspecified: Secondary | ICD-10-CM | POA: Diagnosis not present

## 2021-04-20 DIAGNOSIS — L858 Other specified epidermal thickening: Secondary | ICD-10-CM

## 2021-04-20 DIAGNOSIS — F03C18 Unspecified dementia, severe, with other behavioral disturbance: Secondary | ICD-10-CM

## 2021-04-20 MED ORDER — MUPIROCIN CALCIUM 2 % EX CREA: 1.0000 "application " | TOPICAL_CREAM | Freq: Every day | CUTANEOUS | 0 refills | Status: DC

## 2021-04-20 NOTE — Assessment & Plan Note (Signed)
DM: LOV w/ Endo 04/16/2021.  Medications has been adjusted since the last time I saw her Dementia: Saw neurology 03/10/2021, was rec to continue on Namenda, Aricept, Seroquel. No behavioral issues except for the inversion  sleep pattern.  Advice was provided, recommend to distract and kept her busy during the daytime and provide a calm environment at night. Palliative care: Since the last visit, palliative care service is following the patient: " will continue to follow for complex medical decision making, advance care planning, and clarification of goals".  Cutaneous horn: Referral to dermatology.  Antibiotic ointments daily.  See AVS. RTC 4 months

## 2021-04-20 NOTE — Progress Notes (Signed)
Subjective:    Patient ID: Angel Green, female    DOB: [birth year], 81 y.o.   MRN: [id number]  DOS:  04/20/2021 Type of visit - description: f/u Here with her husband. Since the last office visit she is doing okay. Continue reporting she feels cold all the time.  Was seen with a nose skin horn, it was removed but has come back. No redness at the nose, no fever chills, no no swelling.  Some discharge?Marland Kitchen Notes from palliative care, Endo and neurology reviewed.   Review of Systems Has a tendency to be awake at night and take frequent naps during the daytime.  No other behavioral issues   Past Medical History:  Diagnosis Date   Allergy    Dust Mites   Ankle pain    chronic   Diabetes mellitus, type 2 ([place])    Hyperlipidemia    Hypertension    Memory loss    Osteoarthritis    RENAL INSUFFICIENCY, CHRONIC 04/16/2010   Rhinitis    allergic nos    Past Surgical History:  Procedure Laterality Date   CATARACT EXTRACTION     CHOLECYSTECTOMY     dental implants     PARTIAL HYSTERECTOMY     in her 30s    Current Outpatient Medications  Medication Instructions   amLODipine (NORVASC) 5 MG tablet TAKE 1 TABLET DAILY   Apoaequorin 20 mg, Oral, Daily   aspirin 81 mg, Oral, Daily,     atorvastatin (LIPITOR) 10 MG tablet TAKE 1 TABLET DAILY   Blood Glucose Monitoring Suppl (FREESTYLE LITE) DEVI Use to monitor your blood sugars   Continuous Blood Gluc Sensor (FREESTYLE LIBRE 2 SENSOR) MISC 1 Device, Does not apply, Every 14 days   Cyanocobalamin (B-12 PO) 1 tablet, Oral, Daily, Takes 5000 mcg by mouth daily.   donepezil (ARICEPT) 10 MG tablet TAKE 1 TABLET AT BEDTIME (PENDING APPOINTMENT 01/31/20)   DULoxetine (CYMBALTA) 20 mg, Oral, Daily   EPINEPHrine (EPI-PEN) 0.3 mg,  Once   fluticasone (FLOVENT HFA) 44 MCG/ACT inhaler 2 puffs, Inhalation, 2 times daily   furosemide (LASIX) 20 MG tablet TAKE 2 TABLETS DAILY   glucose blood (FREESTYLE LITE) test strip Use to monitor your  blood sugars 4 times per day; E11.9   Insulin Lispro Prot & Lispro (HUMALOG MIX 75/25 KWIKPEN) (75-25) 100 UNIT/ML Kwikpen 55 Units, Subcutaneous, Daily with breakfast, And pen needles 1/day   Lancets (FREESTYLE) lancets Use to monitor blood sugars 4 times per day, once before each meal and once at bedtime   loratadine (CLARITIN) 10 MG tablet TAKE 1 TABLET DAILY   losartan (COZAAR) 50 MG tablet TAKE 1 TABLET DAILY   memantine (NAMENDA) 10 mg, Oral, 2 times daily   Multiple Vitamins-Minerals (CENTRUM SILVER PO) 1 tablet, Oral, Daily,     mupirocin cream (BACTROBAN) 2 % 1 application, Topical, Daily   omeprazole (PRILOSEC) 20 mg, Oral, Daily   Polyethyl Glycol-Propyl Glycol 0.4-0.3 % SOLN 1 drop, Ophthalmic, Daily PRN   potassium chloride (KLOR-CON) 10 MEQ tablet TAKE 1 TABLET DAILY   QUEtiapine (SEROQUEL) 25 mg, Oral, Daily at bedtime   SURE COMFORT PEN NEEDLES 31G X 5 MM MISC Use to inject insulin 1 time per day.   Trulicity 4.5 mg, Subcutaneous, Weekly       Objective:   Physical Exam BP 126/70 (BP Location: Left Arm, Patient Position: Sitting, Cuff Size: Normal)    Pulse (!) 101    Temp 97.9 F (36.6 C) (Oral)  Resp 18    Ht 5\' 5"  (1.651 m)    Wt 228 lb (103.4 kg)    SpO2 97%    BMI 37.94 kg/m  General:   Well developed, NAD, BMI noted. HEENT:  Normocephalic . Face symmetric, atraumatic Lungs:  CTA B Normal respiratory effort, no intercostal retractions, no accessory muscle use. Heart: Slightly tachycardic but regular.   Lower extremities: no pretibial edema bilaterally  Skin: Has a 4 mm white horn at the tip of the nose.  Surrounding areas without redness, openings, no discharge. Neurologic:  alert, pleasant, cooperative.  Speech normal, gait appropriate for age and unassisted Psych--  Behavior appropriate. No anxious or depressed appearing.      Assessment      Assessment   DM  Dr Loanne Drilling + Neuropathy: Per foot exam 12-2014 HTN ---change ACE to ARB is  03/2014 Hyperlipidemia CRI  dx 2012  Sees Dr Arty Baumgartner  Morbid obesity DJD, had  chronic ankle pain Allergies -- dust mites , occ uses a inhaler , sees allergist, has shots q weeks started ~ 08-2014 Dementia:  MMSE 7/218: 24, rx Aricept.  B12, RPR, sed rate and TSH normal MMSE 07/2017 :13 worse MRI brain 6/19:  prominent right temporal lobe volume loss.  PLAN  DM: LOV w/ Endo 04/16/2021.  Medications has been adjusted since the last time I saw her Dementia: Saw neurology 03/10/2021, was rec to continue on Namenda, Aricept, Seroquel. No behavioral issues except for the inversion  sleep pattern.  Advice was provided, recommend to distract and kept her busy during the daytime and provide a calm environment at night. Palliative care: Since the last visit, palliative care service is following the patient: " will continue to follow for complex medical decision making, advance care planning, and clarification of goals".  Cutaneous horn: Referral to dermatology.  Antibiotic ointments daily.  See AVS. RTC 4 months      This visit occurred during the SARS-CoV-2 public health emergency.  Safety protocols were in place, including screening questions prior to the visit, additional usage of staff PPE, and extensive cleaning of exam room while observing appropriate contact time as indicated for disinfecting solutions.

## 2021-04-20 NOTE — Patient Instructions (Addendum)
Per our records you are due for your diabetic eye exam. Please contact your eye doctor to schedule an appointment. Please have them send copies of your office visit notes to [place]. Our fax number is [phone]. If you need a referral to an eye doctor please let us know.  We are referring you to the dermatologist regards the skin lesion in the nose Until you see them, apply the antibiotic ointment daily. Call anytime if the nose is swollen, red or if you see any discharge    GO TO THE FRONT DESK, [place] back for   a checkup in 4 months

## 2021-04-21 ENCOUNTER — Other Ambulatory Visit: Payer: Self-pay

## 2021-04-21 DIAGNOSIS — E119 Type 2 diabetes mellitus without complications: Secondary | ICD-10-CM

## 2021-04-21 MED ORDER — ONETOUCH ULTRASOFT LANCETS MISC: 12 refills | Status: DC

## 2021-04-21 MED ORDER — ONETOUCH ULTRA VI STRP: ORAL_STRIP | 12 refills | Status: DC

## 2021-04-21 MED ORDER — ONETOUCH ULTRA 2 W/DEVICE KIT: PACK | 0 refills | Status: DC

## 2021-04-22 ENCOUNTER — Telehealth: Payer: Self-pay

## 2021-04-22 ENCOUNTER — Other Ambulatory Visit (HOSPITAL_COMMUNITY): Payer: Self-pay

## 2021-04-22 NOTE — Telephone Encounter (Addendum)
Patient Advocate Encounter ?  ?Received notification from [facility] that prior authorization for One Touch Ultra test strips is required by his/her insurance [employer]. ? ?PA denied due to test strips not on formulary. ?Ins. prefers Freestyle Lite or Precision Xtra. ?  ?PA submitted on 04/22/21 ? ?Key#: [id number] ? ?Status is pending ?   ?[facility] will continue to follow: ? ?Patient Advocate ?Fax: [phone]  ?

## 2021-04-28 ENCOUNTER — Other Ambulatory Visit (HOSPITAL_COMMUNITY): Payer: Self-pay

## 2021-04-30 ENCOUNTER — Other Ambulatory Visit (HOSPITAL_COMMUNITY): Payer: Self-pay

## 2021-05-05 ENCOUNTER — Ambulatory Visit: Payer: Medicare Other | Admitting: Endocrinology

## 2021-05-06 ENCOUNTER — Telehealth: Payer: Self-pay | Admitting: *Deleted

## 2021-05-06 NOTE — Chronic Care Management (AMB) (Signed)
?  Chronic Care Management  ? ?Note ? ?05/06/2021 ?Name: Robertine Kipper MRN: [id number] DOB: 21-Mar-1940 ? ?Henleigh Robello is a 81 y.o. year old female who is a primary care patient of Larose Kells, Alda Berthold, MD. I reached out to [employer] by phone today in response to a referral sent by Ms. [place] PCP. ? ?Ms. Horsford was given information about Chronic Care Management services today including:  ?CCM service includes personalized support from designated clinical staff supervised by her physician, including individualized plan of care and coordination with other care providers ?24/7 contact phone numbers for assistance for urgent and routine care needs. ?Service will only be billed when office clinical staff spend 20 minutes or more in a month to coordinate care. ?Only one practitioner may furnish and bill the service in a calendar month. ?The patient may stop CCM services at any time (effective at the end of the month) by phone call to the office staff. ?The patient is responsible for co-pay (up to 20% after annual deductible is met) if co-pay is required by the individual health plan.  ? ?Patient agreed to services and verbal consent obtained.  ? ?Follow up plan: ?Telephone appointment with care management team member scheduled for: 05/15/2021 ? ?Jessica Checketts, CCMA ?Care Guide, Embedded Care Coordination ?[place]  Care Management  ?Direct Dial: [phone] ? ? ?

## 2021-05-07 ENCOUNTER — Other Ambulatory Visit: Payer: Self-pay

## 2021-05-07 ENCOUNTER — Ambulatory Visit (INDEPENDENT_AMBULATORY_CARE_PROVIDER_SITE_OTHER): Payer: Medicare Other | Admitting: Endocrinology

## 2021-05-07 ENCOUNTER — Encounter: Payer: Self-pay | Admitting: Endocrinology

## 2021-05-07 VITALS — BP 128/88 | HR 98 | Ht 65.0 in | Wt 226.2 lb

## 2021-05-07 DIAGNOSIS — E119 Type 2 diabetes mellitus without complications: Secondary | ICD-10-CM | POA: Diagnosis not present

## 2021-05-07 LAB — POCT GLYCOSYLATED HEMOGLOBIN (HGB A1C): Hemoglobin A1C: 8.8 % — AB (ref 4.0–5.6)

## 2021-05-07 MED ORDER — TRULICITY 4.5 MG/0.5ML ~~LOC~~ SOAJ: 4.5000 mg | SUBCUTANEOUS | 3 refills | Status: DC

## 2021-05-07 MED ORDER — INSULIN LISPRO PROT & LISPRO (75-25 MIX) 100 UNIT/ML KWIKPEN: 60.0000 [IU] | PEN_INJECTOR | Freq: Every day | SUBCUTANEOUS | 3 refills | Status: DC

## 2021-05-07 NOTE — Patient Instructions (Addendum)
I have sent a prescription to your pharmacy, to increase the insulin to 60 units with breakfast.  ?Please continue the same Trulicity.  I changed the prescription to 4.5 mg shots ?check your blood sugar twice a day.  vary the time of day when you check, between before the 3 meals, and at bedtime.  also check if you have symptoms of your blood sugar being too high or too low.  please keep a record of the readings and bring it to your next appointment here (or you can bring the meter itself).  You can write it on any piece of paper.  please call us sooner if your blood sugar goes below 70, or if you have a lot of readings over 200.   ?Please come back for a follow-up appointment in 2 months.  ?

## 2021-05-07 NOTE — Progress Notes (Signed)
? ?Subjective:  ? ? Patient ID: Angel Green, female    DOB: [birth year], 81 y.o.   MRN: [id number] ? ?HPI ?Pt returns for f/u of diabetes mellitus:  ?DM type: Insulin-requiring type 2.   ?Dx'ed: 1999 ?Complications: PN, PAD, and CRI.   ?Therapy: insulin since 3300, and Trulicity.   ?GDM: never.  ?DKA: never.   ?Severe hypoglycemia: once, in 2019.   ?Pancreatitis: never.  ?SDOH: rx is limited by memory loss; husband sets pen to # of units, and pt administers; Pt was unable to get continuous glucose monitor.   ?Other: she changed to QAM premixed insulin, after poor results with multiple daily injections; pattern of cbg's indicates she does not need a PM dose; fructosamine converts to slightly higher A1c than A1c itself.   ?Interval history: no cbg record, but husband states cbg's are approx 300.  No recent steroids.  Husb says [facility] now has Trulicity 4.5 mg in stock ?Past Medical History:  ?Diagnosis Date  ? Allergy   ? Dust Mites  ? Ankle pain   ? chronic  ? Diabetes mellitus, type 2 ([place])   ? Hyperlipidemia   ? Hypertension   ? Memory loss   ? Osteoarthritis   ? RENAL INSUFFICIENCY, CHRONIC 04/16/2010  ? Rhinitis   ? allergic nos  ? ? ?Past Surgical History:  ?Procedure Laterality Date  ? CATARACT EXTRACTION    ? CHOLECYSTECTOMY    ? dental implants    ? PARTIAL HYSTERECTOMY    ? in her 49s  ? ? ?Social History  ? ?Socioeconomic History  ? Marital status: Married  ?  Spouse name: Not on file  ? Number of children: 2  ? Years of education: some college  ? Highest education level: Not on file  ?Occupational History  ? Occupation:  retired  ?Tobacco Use  ? Smoking status: Never  ? Smokeless tobacco: Never  ?Vaping Use  ? Vaping Use: Never used  ?Substance and Sexual Activity  ? Alcohol use: No  ?  Alcohol/week: 0.0 standard drinks  ? Drug use: No  ? Sexual activity: Yes  ?  Partners: Male  ?Other Topics Concern  ? Not on file  ?Social History Narrative  ? Husband is a Company secretary.  ? Right-handed.  ? 1 cup  caffeine daily.  ? Lives at home with husband.  ?    ?   ? ?Social Determinants of Health  ? ?Financial Resource Strain: Not on file  ?Food Insecurity: Not on file  ?Transportation Needs: Not on file  ?Physical Activity: Not on file  ?Stress: Not on file  ?Social Connections: Not on file  ?Intimate Partner Violence: Not on file  ? ? ?Current Outpatient Medications on File Prior to Visit  ?Medication Sig Dispense Refill  ? amLODipine (NORVASC) 5 MG tablet TAKE 1 TABLET DAILY 90 tablet 1  ? Apoaequorin 20 MG CAPS Take 20 mg by mouth daily.    ? aspirin 81 MG tablet Take 81 mg by mouth daily.    ? atorvastatin (LIPITOR) 10 MG tablet TAKE 1 TABLET DAILY 90 tablet 1  ? Blood Glucose Monitoring Suppl (FREESTYLE LITE) DEVI Use to monitor your blood sugars 1 each 0  ? Blood Glucose Monitoring Suppl (ONE TOUCH ULTRA 2) w/Device KIT USE AS DIRECTED 1 kit 0  ? Continuous Blood Gluc Sensor (FREESTYLE LIBRE 2 SENSOR) MISC 1 Device by Does not apply route every 14 (fourteen) days. 6 each 3  ? Cyanocobalamin (B-12 PO) Take 1  tablet by mouth daily. Takes 5000 mcg by mouth daily.    ? donepezil (ARICEPT) 10 MG tablet TAKE 1 TABLET AT BEDTIME (PENDING APPOINTMENT 01/31/20) 90 tablet 3  ? DULoxetine (CYMBALTA) 20 MG capsule Take 1 capsule (20 mg total) by mouth daily. 90 capsule 1  ? EPINEPHrine 0.3 mg/0.3 mL IJ SOAJ injection Inject 0.3 mg into the muscle once. Reported on 06/30/2015    ? fluticasone (FLOVENT HFA) 44 MCG/ACT inhaler Inhale 2 puffs into the lungs 2 (two) times daily. 3 Inhaler 3  ? furosemide (LASIX) 20 MG tablet TAKE 2 TABLETS DAILY 180 tablet 1  ? glucose blood (FREESTYLE LITE) test strip Use to monitor your blood sugars 4 times per day; E11.9 400 each 12  ? glucose blood (ONETOUCH ULTRA) test strip Use as instructed 100 each 12  ? Lancets (FREESTYLE) lancets Use to monitor blood sugars 4 times per day, once before each meal and once at bedtime 400 each 12  ? Lancets (ONETOUCH ULTRASOFT) lancets Use as instructed 100  each 12  ? loratadine (CLARITIN) 10 MG tablet TAKE 1 TABLET DAILY 90 tablet 3  ? losartan (COZAAR) 50 MG tablet TAKE 1 TABLET DAILY 90 tablet 1  ? memantine (NAMENDA) 10 MG tablet Take 1 tablet (10 mg total) by mouth 2 (two) times daily. 180 tablet 3  ? Multiple Vitamins-Minerals (CENTRUM SILVER PO) Take 1 tablet by mouth daily.    ? mupirocin cream (BACTROBAN) 2 % Apply 1 application topically daily. 30 g 0  ? omeprazole (PRILOSEC) 20 MG capsule Take 1 capsule (20 mg total) by mouth daily. 90 capsule 3  ? Polyethyl Glycol-Propyl Glycol 0.4-0.3 % SOLN Apply 1 drop to eye daily as needed (for dry eyes).    ? potassium chloride (KLOR-CON) 10 MEQ tablet TAKE 1 TABLET DAILY 90 tablet 1  ? QUEtiapine (SEROQUEL) 25 MG tablet Take 1 tablet (25 mg total) by mouth at bedtime. 90 tablet 3  ? SURE COMFORT PEN NEEDLES 31G X 5 MM MISC Use to inject insulin 1 time per day. 100 each 2  ? ?No current facility-administered medications on file prior to visit.  ? ? ?Allergies  ?Allergen Reactions  ? Dust Mite Mixed Allergen Ext [Mite (D. Farinae)]   ? ? ? ? ?BP 128/88 (BP Location: Left Arm, Patient Position: Sitting, Cuff Size: Normal)   Pulse 98   Ht 5' 5" (1.651 m)   Wt 226 lb 3.2 oz (102.6 kg)   SpO2 100%   BMI 37.64 kg/m?  ? ? ?Review of Systems ?Husb denies hypoglycemia ?   ?Objective:  ? Physical Exam ?VITAL SIGNS:  See vs page ?GENERAL: no distress ? ? ?Lab Results  ?Component Value Date  ? HGBA1C 8.8 (A) 05/07/2021  ? ?   ?Assessment & Plan:  ?Insulin-requiring type 2 DM: uncontrolled ? ?Patient Instructions  ?I have sent a prescription to your pharmacy, to increase the insulin to 60 units with breakfast.  ?Please continue the same Trulicity.  I changed the prescription to 4.5 mg shots ?check your blood sugar twice a day.  vary the time of day when you check, between before the 3 meals, and at bedtime.  also check if you have symptoms of your blood sugar being too high or too low.  please keep a record of the readings  and bring it to your next appointment here (or you can bring the meter itself).  You can write it on any piece of paper.  please call us   sooner if your blood sugar goes below 70, or if you have a lot of readings over 200.   ?Please come back for a follow-up appointment in 2 months.  ? ? ?

## 2021-05-15 ENCOUNTER — Telehealth: Payer: Medicare Other

## 2021-05-18 ENCOUNTER — Emergency Department (HOSPITAL_COMMUNITY)
Admission: EM | Admit: 2021-05-18 | Discharge: 2021-05-18 | Disposition: A | Discharge status: Home / Self Care | Payer: Medicare Other | Attending: Emergency Medicine | Admitting: Emergency Medicine

## 2021-05-18 ENCOUNTER — Telehealth: Payer: Self-pay

## 2021-05-18 ENCOUNTER — Emergency Department (HOSPITAL_COMMUNITY): Payer: Medicare Other

## 2021-05-18 ENCOUNTER — Other Ambulatory Visit: Payer: Self-pay | Admitting: Endocrinology

## 2021-05-18 DIAGNOSIS — F03B0 Unspecified dementia, moderate, without behavioral disturbance, psychotic disturbance, mood disturbance, and anxiety: Secondary | ICD-10-CM

## 2021-05-18 DIAGNOSIS — Z794 Long term (current) use of insulin: Secondary | ICD-10-CM | POA: Insufficient documentation

## 2021-05-18 DIAGNOSIS — D649 Anemia, unspecified: Secondary | ICD-10-CM | POA: Diagnosis not present

## 2021-05-18 DIAGNOSIS — Z7982 Long term (current) use of aspirin: Secondary | ICD-10-CM | POA: Insufficient documentation

## 2021-05-18 DIAGNOSIS — R509 Fever, unspecified: Secondary | ICD-10-CM | POA: Diagnosis not present

## 2021-05-18 DIAGNOSIS — R4182 Altered mental status, unspecified: Secondary | ICD-10-CM | POA: Insufficient documentation

## 2021-05-18 DIAGNOSIS — F0390 Unspecified dementia without behavioral disturbance: Secondary | ICD-10-CM | POA: Diagnosis not present

## 2021-05-18 DIAGNOSIS — I959 Hypotension, unspecified: Secondary | ICD-10-CM | POA: Diagnosis not present

## 2021-05-18 DIAGNOSIS — F02B0 Dementia in other diseases classified elsewhere, moderate, without behavioral disturbance, psychotic disturbance, mood disturbance, and anxiety: Secondary | ICD-10-CM | POA: Diagnosis not present

## 2021-05-18 DIAGNOSIS — E162 Hypoglycemia, unspecified: Secondary | ICD-10-CM | POA: Diagnosis not present

## 2021-05-18 DIAGNOSIS — R531 Weakness: Secondary | ICD-10-CM | POA: Diagnosis not present

## 2021-05-18 LAB — URINALYSIS, ROUTINE W REFLEX MICROSCOPIC
Bilirubin Urine: NEGATIVE
Glucose, UA: NEGATIVE mg/dL
Hgb urine dipstick: NEGATIVE
Ketones, ur: NEGATIVE mg/dL
Nitrite: NEGATIVE
Protein, ur: NEGATIVE mg/dL
Specific Gravity, Urine: 1.01 (ref 1.005–1.030)
pH: 7 (ref 5.0–8.0)

## 2021-05-18 LAB — CBC
HCT: 44.5 % (ref 36.0–46.0)
Hemoglobin: 14.6 g/dL (ref 12.0–15.0)
MCH: 32.2 pg (ref 26.0–34.0)
MCHC: 32.8 g/dL (ref 30.0–36.0)
MCV: 98 fL (ref 80.0–100.0)
Platelets: 219 10*3/uL (ref 150–400)
RBC: 4.54 MIL/uL (ref 3.87–5.11)
RDW: 13.8 % (ref 11.5–15.5)
WBC: 6 10*3/uL (ref 4.0–10.5)
nRBC: 0 % (ref 0.0–0.2)

## 2021-05-18 LAB — URINALYSIS, MICROSCOPIC (REFLEX)

## 2021-05-18 LAB — COMPREHENSIVE METABOLIC PANEL
ALT: 22 U/L (ref 0–44)
AST: 27 U/L (ref 15–41)
Albumin: 3.4 g/dL — ABNORMAL LOW (ref 3.5–5.0)
Alkaline Phosphatase: 63 U/L (ref 38–126)
Anion gap: 5 (ref 5–15)
BUN: 11 mg/dL (ref 8–23)
CO2: 29 mmol/L (ref 22–32)
Calcium: 9.6 mg/dL (ref 8.9–10.3)
Chloride: 107 mmol/L (ref 98–111)
Creatinine, Ser: 1.57 mg/dL — ABNORMAL HIGH (ref 0.44–1.00)
GFR, Estimated: 33 mL/min — ABNORMAL LOW (ref >60)
Glucose, Bld: 112 mg/dL — ABNORMAL HIGH (ref 70–99)
Potassium: 4.1 mmol/L (ref 3.5–5.1)
Sodium: 141 mmol/L (ref 135–145)
Total Bilirubin: 0.8 mg/dL (ref 0.3–1.2)
Total Protein: 7.3 g/dL (ref 6.5–8.1)

## 2021-05-18 MED ORDER — OZEMPIC (2 MG/DOSE) 8 MG/3ML ~~LOC~~ SOPN: 2.0000 mg | PEN_INJECTOR | SUBCUTANEOUS | 3 refills | Status: DC

## 2021-05-18 NOTE — Telephone Encounter (Signed)
Incoming fax from pharmacy stating Trulicity Pen 2.0BT 4.'5mg'$  is unavailable. Trulicity Pen 5.9RC 1.63A and 1.5 mg are available. Please send new rx for alternative. ?

## 2021-05-18 NOTE — ED Notes (Signed)
Patient transported to X-ray 

## 2021-05-18 NOTE — ED Triage Notes (Signed)
Pt here via EMS from home d/t AMS. History of dementia. Alert and oriented X2. Self and place. Reported pt didn't want to get out of bed and isnt talking a lot today. Pt denies pain at this time.  ? ?134/88 ?P 80 ?96% RA  ?CBG 114 ?

## 2021-05-18 NOTE — Discharge Instructions (Signed)
Were evaluated here in the emergency department for acute abnormalities inside of your head with a CAT scan.  There is no evidence of new abnormalities noted today ?You were evaluated with lab work, urine sample, and an x-[place] Alldredge of your chest.  There is no evidence of infection or metabolic abnormality. ?Please call your doctor tomorrow for recheck ?Return to the emergency department if you are having any worsening symptoms. ?

## 2021-05-18 NOTE — ED Provider Notes (Signed)
?[place] ?Provider Note ? ? ?CSN: [id number] ?Arrival date & time: 05/18/21  1301 ? ?  ? ?History ? ?Chief Complaint  ?Patient presents with  ? Altered Mental Status  ? ? ?Angel Green is a 81 y.o. female. ? ?HPI ?81 year old female history of dementia presents today with report of altered mental.  Patient has dementia at baseline but her husband states that she normally gets up and gets around and eats breakfast.  He is main caregiver.  Today she did not seem as interactive as usual.  He states that she did not want to get out of bed.  He called EMS secondary to this.  He states that she was cold but that is her baseline.  She has not had any fever, chills, headache, injury, nausea, vomiting, or diarrhea. ?  ? ?Home Medications ?Prior to Admission medications   ?Medication Sig Start Date End Date Taking? Authorizing Provider  ?amLODipine (NORVASC) 5 MG tablet TAKE 1 TABLET DAILY 03/02/21   Colon Branch, MD  ?Apoaequorin 20 MG CAPS Take 20 mg by mouth daily.    [provider]  ?aspirin 81 MG tablet Take 81 mg by mouth daily.    [provider]  ?atorvastatin (LIPITOR) 10 MG tablet TAKE 1 TABLET DAILY 11/10/20   Colon Branch, MD  ?Blood Glucose Monitoring Suppl (FREESTYLE LITE) DEVI Use to monitor your blood sugars 12/26/17   Renato Shin, MD  ?Blood Glucose Monitoring Suppl (ONE TOUCH ULTRA 2) w/Device KIT USE AS DIRECTED 04/21/21   Renato Shin, MD  ?Continuous Blood Gluc Sensor (FREESTYLE LIBRE 2 SENSOR) MISC 1 Device by Does not apply route every 14 (fourteen) days. 03/04/21   Renato Shin, MD  ?Cyanocobalamin (B-12 PO) Take 1 tablet by mouth daily. Takes 5000 mcg by mouth daily.    [provider]  ?donepezil (ARICEPT) 10 MG tablet TAKE 1 TABLET AT BEDTIME (PENDING APPOINTMENT 01/31/20) 03/02/21   Frann Rider, NP  ?DULoxetine (CYMBALTA) 20 MG capsule Take 1 capsule (20 mg total) by mouth daily. 09/29/20   Colon Branch, MD  ?EPINEPHrine 0.3 mg/0.3 mL  IJ SOAJ injection Inject 0.3 mg into the muscle once. Reported on 06/30/2015    [provider]  ?fluticasone (FLOVENT HFA) 44 MCG/ACT inhaler Inhale 2 puffs into the lungs 2 (two) times daily. 07/19/13   Baird Lyons D, MD  ?furosemide (LASIX) 20 MG tablet TAKE 2 TABLETS DAILY 01/05/21   Colon Branch, MD  ?glucose blood (FREESTYLE LITE) test strip Use to monitor your blood sugars 4 times per day; E11.9 01/12/19   Renato Shin, MD  ?glucose blood (ONETOUCH ULTRA) test strip Use as instructed 04/21/21   Renato Shin, MD  ?Insulin Lispro Prot & Lispro (HUMALOG MIX 75/25 KWIKPEN) (75-25) 100 UNIT/ML Kwikpen Inject 60 Units into the skin daily with breakfast. And pen needles 1/day 05/07/21   Renato Shin, MD  ?Lancets (FREESTYLE) lancets Use to monitor blood sugars 4 times per day, once before each meal and once at bedtime 12/26/17   Renato Shin, MD  ?Lancets [facility] ULTRASOFT) lancets Use as instructed 04/21/21   Renato Shin, MD  ?loratadine (CLARITIN) 10 MG tablet TAKE 1 TABLET DAILY 03/30/21   Colon Branch, MD  ?losartan (COZAAR) 50 MG tablet TAKE 1 TABLET DAILY 11/10/20   Colon Branch, MD  ?memantine (NAMENDA) 10 MG tablet Take 1 tablet (10 mg total) by mouth 2 (two) times daily. 01/31/20   Frann Rider, NP  ?  Multiple Vitamins-Minerals (CENTRUM SILVER PO) Take 1 tablet by mouth daily.    [provider]  ?mupirocin cream (BACTROBAN) 2 % Apply 1 application topically daily. 04/20/21   Colon Branch, MD  ?omeprazole (PRILOSEC) 20 MG capsule Take 1 capsule (20 mg total) by mouth daily. 02/07/18   Colon Branch, MD  ?Polyethyl Glycol-Propyl Glycol 0.4-0.3 % SOLN Apply 1 drop to eye daily as needed (for dry eyes).    [provider]  ?potassium chloride (KLOR-CON) 10 MEQ tablet TAKE 1 TABLET DAILY 12/02/20   Colon Branch, MD  ?QUEtiapine (SEROQUEL) 25 MG tablet Take 1 tablet (25 mg total) by mouth at bedtime. 01/31/20   Frann Rider, NP  ?Semaglutide, 2 MG/DOSE, (OZEMPIC, 2 MG/DOSE,) 8 MG/3ML SOPN  Inject 2 mg into the skin once a week. 05/18/21   Renato Shin, MD  ?SURE COMFORT PEN NEEDLES 31G X 5 MM MISC Use to inject insulin 1 time per day. 09/09/20   Renato Shin, MD  ?   ? ?Allergies    ?Dust mite mixed allergen ext [mite (d. farinae)]   ? ?Review of Systems   ?Review of Systems  ?Unable to perform ROS: Dementia  ? ?Physical Exam ?Updated Vital Signs ?BP 140/70   Pulse 79   Temp 98.4 ?F (36.9 ?C) (Rectal)   Resp 16   SpO2 94%  ?Physical Exam ?Vitals and nursing note reviewed.  ?Constitutional:   ?   General: She is not in acute distress. ?   Appearance: Normal appearance.  ?HENT:  ?   Right Ear: External ear normal.  ?   Left Ear: External ear normal.  ?   Nose: Nose normal.  ?   Mouth/Throat:  ?   Pharynx: Oropharynx is clear.  ?Eyes:  ?   Extraocular Movements: Extraocular movements intact.  ?   Pupils: Pupils are equal, round, and reactive to light.  ?Cardiovascular:  ?   Rate and Rhythm: Normal rate and regular rhythm.  ?   Pulses: Normal pulses.  ?Pulmonary:  ?   Effort: Pulmonary effort is normal.  ?   Breath sounds: Normal breath sounds.  ?Abdominal:  ?   Palpations: Abdomen is soft.  ?Musculoskeletal:     ?   General: Normal range of motion.  ?   Cervical back: Normal range of motion.  ?Skin: ?   General: Skin is warm.  ?   Capillary Refill: Capillary refill takes less than 2 seconds.  ?Neurological:  ?   General: No focal deficit present.  ?   Mental Status: She is alert. Mental status is at baseline.  ?   Cranial Nerves: No cranial nerve deficit.  ?   Motor: No weakness.  ?   Coordination: Coordination normal.  ?   Deep Tendon Reflexes: Reflexes normal.  ?Psychiatric:     ?   Mood and Affect: Mood normal.  ? ? ?ED Results / Procedures / Treatments   ?Labs ?(all labs ordered are listed, but only abnormal results are displayed) ?Labs Reviewed  ?URINALYSIS, ROUTINE W REFLEX MICROSCOPIC - Abnormal; Notable for the following components:  ?    Result Value  ? Leukocytes,Ua SMALL (*)   ? All other  components within normal limits  ?COMPREHENSIVE METABOLIC PANEL - Abnormal; Notable for the following components:  ? Glucose, Bld 112 (*)   ? Creatinine, Ser 1.57 (*)   ? Albumin 3.4 (*)   ? GFR, Estimated 33 (*)   ? All other components within normal  limits  ?URINALYSIS, MICROSCOPIC (REFLEX) - Abnormal; Notable for the following components:  ? Bacteria, UA RARE (*)   ? All other components within normal limits  ?CBC  ? ? ?EKG ?None ? ?Radiology ?CT Head Wo Contrast ? ?Result Date: 05/18/2021 ?CLINICAL DATA:  Mental status change. EXAM: CT HEAD WITHOUT CONTRAST TECHNIQUE: Contiguous axial images were obtained from the base of the skull through the vertex without intravenous contrast. RADIATION DOSE REDUCTION: This exam was performed according to the departmental dose-optimization program which includes automated exposure control, adjustment of the mA and/or kV according to patient size and/or use of iterative reconstruction technique. COMPARISON:  Brain MRI 08/13/2017 FINDINGS: Brain: No evidence of acute infarction, hemorrhage, hydrocephalus, extra-axial collection or mass lesion/mass effect. There is mild diffuse low-attenuation within the subcortical and periventricular white matter compatible with chronic microvascular disease. Prominence of the sulci and ventricles compatible with brain atrophy. Vascular: No hyperdense vessel or unexpected calcification. Skull: Normal. Negative for fracture or focal lesion. Sinuses/Orbits: No acute finding. Other: None. IMPRESSION: 1. No acute intracranial abnormalities. 2. Chronic small vessel ischemic disease and brain atrophy. Electronically Signed   By: Kerby Moors M.D.   On: 05/18/2021 14:05  ? ?DG Chest Port 1 View ? ?Result Date: 05/18/2021 ?CLINICAL DATA:  Altered mental status EXAM: PORTABLE CHEST 1 VIEW COMPARISON:  04/01/2014 FINDINGS: The heart size and mediastinal contours are within normal limits. Both lungs are clear. The visualized skeletal structures are  unremarkable. IMPRESSION: No active disease. Electronically Signed   By: Elmer Picker M.D.   On: 05/18/2021 13:45   ? ?Procedures ?Procedures  ? ? ?Medications Ordered in ED ?Medications - No data to dis

## 2021-05-19 ENCOUNTER — Other Ambulatory Visit: Payer: Self-pay | Admitting: Adult Health

## 2021-05-19 ENCOUNTER — Telehealth: Payer: Self-pay | Admitting: Adult Health

## 2021-05-19 NOTE — Telephone Encounter (Signed)
Pt's husband, Navya Timmons request refill forQUEtiapine (SEROQUEL) 25 MG tablet at [place] ? ?Mr. Zeimet said pt has only tablet left. Would like an amount of refill sent to a local pharmacy until [facility] refill comes. Please sent to [facility]. Would like a call from the nurse to confirm a portion of the prescription has been sent. ?

## 2021-05-20 ENCOUNTER — Telehealth: Payer: Self-pay | Admitting: Internal Medicine

## 2021-05-20 MED ORDER — DONEPEZIL HCL 10 MG PO TABS: 10.0000 mg | ORAL_TABLET | Freq: Every day | ORAL | 1 refills | Status: DC

## 2021-05-20 MED ORDER — QUETIAPINE FUMARATE 25 MG PO TABS: 25.0000 mg | ORAL_TABLET | Freq: Every day | ORAL | 1 refills | Status: DC

## 2021-05-20 MED ORDER — MEMANTINE HCL 10 MG PO TABS: 10.0000 mg | ORAL_TABLET | Freq: Two times a day (BID) | ORAL | 1 refills | Status: DC

## 2021-05-20 MED ORDER — QUETIAPINE FUMARATE 25 MG PO TABS: 25.0000 mg | ORAL_TABLET | Freq: Every day | ORAL | 0 refills | Status: DC

## 2021-05-20 NOTE — Telephone Encounter (Signed)
Called patient's husband and advised refill sent for 90 days to local pharmacy. Ongoing refills through PCP. He had multiple questions, concerns about who would prescribe Seroquel. After long discussion he agreed to pick up 30 day supply at local pharmacy and call Dr Larose Kells to discuss him taking over Rxs. he verbalized understanding, appreciation. ? ?

## 2021-05-20 NOTE — Telephone Encounter (Signed)
Please advise- per neuro telephone note from 05/19/21- further refills from PCP?  ?

## 2021-05-20 NOTE — Telephone Encounter (Signed)
Pt's husband states neurologist sent partial prescription to [facility]. They would the [facility]. They were advised this would have to be done by pcp. Please advise.  ? ?Medication: QUEtiapine (SEROQUEL) 25 MG tablet  ? ? ?Preferred Pharmacy: [facility]  ?[address], [place] [address]  ?Phone:  [phone]  Fax:  [phone]  ? ?

## 2021-05-20 NOTE — Telephone Encounter (Signed)
Okay to refill Namenda, Aricept and Seroquel as requested by neurology, see 03/10/2021 neurology note and as long as the patient is stable. ? ?

## 2021-05-20 NOTE — Telephone Encounter (Signed)
Will send 90 day supply to local pharmacy but please remind they should be following up with PCP for ongoing refills and management. Thank you.  ?

## 2021-05-20 NOTE — Telephone Encounter (Signed)
Rxs sent

## 2021-05-21 DIAGNOSIS — B078 Other viral warts: Secondary | ICD-10-CM | POA: Diagnosis not present

## 2021-05-21 DIAGNOSIS — L281 Prurigo nodularis: Secondary | ICD-10-CM | POA: Diagnosis not present

## 2021-05-25 DIAGNOSIS — F0390 Unspecified dementia without behavioral disturbance: Secondary | ICD-10-CM | POA: Diagnosis not present

## 2021-05-25 DIAGNOSIS — Z6841 Body Mass Index (BMI) 40.0 and over, adult: Secondary | ICD-10-CM | POA: Diagnosis not present

## 2021-05-25 DIAGNOSIS — N2581 Secondary hyperparathyroidism of renal origin: Secondary | ICD-10-CM | POA: Diagnosis not present

## 2021-05-25 DIAGNOSIS — I129 Hypertensive chronic kidney disease with stage 1 through stage 4 chronic kidney disease, or unspecified chronic kidney disease: Secondary | ICD-10-CM | POA: Diagnosis not present

## 2021-05-25 DIAGNOSIS — D631 Anemia in chronic kidney disease: Secondary | ICD-10-CM | POA: Diagnosis not present

## 2021-05-25 DIAGNOSIS — E785 Hyperlipidemia, unspecified: Secondary | ICD-10-CM | POA: Diagnosis not present

## 2021-05-25 DIAGNOSIS — N1831 Chronic kidney disease, stage 3a: Secondary | ICD-10-CM | POA: Diagnosis not present

## 2021-05-30 ENCOUNTER — Other Ambulatory Visit: Payer: Self-pay | Admitting: Internal Medicine

## 2021-06-01 ENCOUNTER — Other Ambulatory Visit: Payer: Self-pay | Admitting: Internal Medicine

## 2021-06-01 DIAGNOSIS — E876 Hypokalemia: Secondary | ICD-10-CM

## 2021-06-10 ENCOUNTER — Encounter: Payer: Self-pay | Admitting: Internal Medicine

## 2021-06-10 ENCOUNTER — Ambulatory Visit: Payer: Medicare Other | Admitting: Internal Medicine

## 2021-06-24 ENCOUNTER — Ambulatory Visit (INDEPENDENT_AMBULATORY_CARE_PROVIDER_SITE_OTHER): Payer: Medicare Other | Admitting: Internal Medicine

## 2021-06-24 ENCOUNTER — Encounter: Payer: Self-pay | Admitting: Internal Medicine

## 2021-06-24 VITALS — BP 128/74 | HR 94 | Temp 97.9°F | Resp 18 | Ht 65.0 in | Wt 228.4 lb

## 2021-06-24 DIAGNOSIS — F03C18 Unspecified dementia, severe, with other behavioral disturbance: Secondary | ICD-10-CM

## 2021-06-24 DIAGNOSIS — R32 Unspecified urinary incontinence: Secondary | ICD-10-CM | POA: Diagnosis not present

## 2021-06-24 DIAGNOSIS — L858 Other specified epidermal thickening: Secondary | ICD-10-CM | POA: Diagnosis not present

## 2021-06-24 NOTE — Patient Instructions (Addendum)
Please provide a urine sample ? ?Please schedule a Medicare Wellness visit.  ? ?Recommend to proceed with the following vaccine at your pharmacy: ?Covid booster (bivalent) ? ?Per our records you are due for your diabetic eye exam. Please contact your eye doctor to schedule an appointment. Please have them send copies of your office visit notes to [place]. Our fax number is [phone]. If you need a referral to an eye doctor please let us know. ? ?For urinary incontinence: ?Please consider using depends (diapers) ?If there is some mild skin irritation at the private area, use Desitin ?If she has severe retention please let me know ? ?See you in June, you really have an appointment ? ?

## 2021-06-24 NOTE — Progress Notes (Signed)
? ?Subjective:  ? ? Patient ID: Angel Green, female    DOB: 08/10/1940, 81 y.o.   MRN: [id number] ? ?DOS:  06/24/2021 ?Type of visit - description: Acute, here with her husband ? ?Pt's husband main concern today is the nasal cutaneous horn. ?See last visit, saw dermatology, no action was taken. ?The patient is constantly picking at it. ?No discharge. ? ?Was seen at the ER with MS changes, currently at baseline. ? ?Also, reports urinary incontinence.  No bowel incontinence. ?No fever chills ?No abdominal pain ?The patient has not complained of dysuria.   ? ?Review of Systems ?See above  ? ?Past Medical History:  ?Diagnosis Date  ? Allergy   ? Dust Mites  ? Ankle pain   ? chronic  ? Diabetes mellitus, type 2 ([place])   ? Hyperlipidemia   ? Hypertension   ? Memory loss   ? Osteoarthritis   ? RENAL INSUFFICIENCY, CHRONIC 04/16/2010  ? Rhinitis   ? allergic nos  ? ? ?Past Surgical History:  ?Procedure Laterality Date  ? CATARACT EXTRACTION    ? CHOLECYSTECTOMY    ? dental implants    ? PARTIAL HYSTERECTOMY    ? in her 53s  ? ? ?Current Outpatient Medications  ?Medication Instructions  ? amLODipine (NORVASC) 5 MG tablet TAKE 1 TABLET DAILY  ? Apoaequorin 20 mg, Oral, Daily  ? aspirin 81 mg, Oral, Daily,    ? atorvastatin (LIPITOR) 10 MG tablet TAKE 1 TABLET DAILY  ? Blood Glucose Monitoring Suppl (FREESTYLE LITE) DEVI Use to monitor your blood sugars  ? Blood Glucose Monitoring Suppl (ONE TOUCH ULTRA 2) w/Device KIT USE AS DIRECTED  ? Continuous Blood Gluc Sensor (FREESTYLE LIBRE 2 SENSOR) MISC 1 Device, Does not apply, Every 14 days  ? Cyanocobalamin (B-12 PO) 1 tablet, Oral, Daily, Takes 5000 mcg by mouth daily.  ? donepezil (ARICEPT) 10 mg, Oral, Daily at bedtime  ? DULoxetine (CYMBALTA) 20 MG capsule TAKE 1 CAPSULE DAILY  ? EPINEPHrine (EPI-PEN) 0.3 mg,  Once  ? fluticasone (FLOVENT HFA) 44 MCG/ACT inhaler 2 puffs, Inhalation, 2 times daily  ? furosemide (LASIX) 20 MG tablet TAKE 2 TABLETS DAILY  ? glucose blood  (FREESTYLE LITE) test strip Use to monitor your blood sugars 4 times per day; E11.9  ? glucose blood (ONETOUCH ULTRA) test strip Use as instructed  ? Insulin Lispro Prot & Lispro (HUMALOG MIX 75/25 KWIKPEN) (75-25) 100 UNIT/ML Kwikpen 60 Units, Subcutaneous, Daily with breakfast, And pen needles 1/day  ? Lancets (FREESTYLE) lancets Use to monitor blood sugars 4 times per day, once before each meal and once at bedtime  ? Lancets (ONETOUCH ULTRASOFT) lancets Use as instructed  ? loratadine (CLARITIN) 10 MG tablet TAKE 1 TABLET DAILY  ? losartan (COZAAR) 50 MG tablet TAKE 1 TABLET DAILY  ? memantine (NAMENDA) 10 mg, Oral, 2 times daily  ? Multiple Vitamins-Minerals (CENTRUM SILVER PO) 1 tablet, Oral, Daily,    ? mupirocin cream (BACTROBAN) 2 % 1 application., Topical, Daily  ? omeprazole (PRILOSEC) 20 mg, Oral, Daily  ? Ozempic (2 MG/DOSE) 2 mg, Subcutaneous, Weekly  ? Polyethyl Glycol-Propyl Glycol 0.4-0.3 % SOLN 1 drop, Ophthalmic, Daily PRN  ? potassium chloride (KLOR-CON) 10 MEQ tablet TAKE 1 TABLET DAILY  ? QUEtiapine (SEROQUEL) 25 mg, Oral, Daily at bedtime  ? SURE COMFORT PEN NEEDLES 31G X 5 MM MISC Use to inject insulin 1 time per day.  ? ? ?   ?Objective:  ? Physical Exam ?BP 128/74 (BP  Location: Left Arm, Patient Position: Sitting, Cuff Size: Normal)   Pulse 94   Temp 97.9 ?F (36.6 ?C) (Oral)   Resp 18   Ht 5' 5" (1.651 m)   Wt 228 lb 6 oz (103.6 kg)   SpO2 97%   BMI 38.00 kg/m?  ?General:   ?Well developed, NAD, BMI noted. ?HEENT:  ?Normocephalic . Face symmetric, atraumatic ?Nose: At the tip of the nose, R side, has a skin lesion, it is about half centimeter in diameter, borders are elevated, surrounded by area of hyperpigmentation. ?Neurologic:  ?alert & pleasant, cooperative. ?Patient is not interactive ?Psych--  ?  ?Behavior appropriate. ?No anxious or depressed appearing.  ? ?   ?Assessment   ? ?  ?Assessment   ?DM  Dr Loanne Drilling ?+ Neuropathy: Per foot exam 12-2014 ?HTN ---change ACE to ARB is  03/2014 ?Hyperlipidemia ?CRI  dx 2012  Sees Dr Arty Baumgartner  ?Morbid obesity ?DJD, had  chronic ankle pain ?Allergies -- dust mites , occ uses a inhaler , sees allergist, has shots q weeks started ~ 08-2014 ?Dementia:  ?MMSE 7/218: 24, rx Aricept.  B12, RPR, sed rate and TSH normal ?MMSE 07/2017 :13 worse ?MRI brain 6/19:  prominent right temporal lobe volume loss. ? ?PLAN ?Urinary incontinence: As described above, explained the patient's husband that this is common in patients with dementia, she is not doing that intentionally (as he believed) ?No UTI symptoms however for completeness we will check a UA, urine culture (if possible). ?Recommend the use of diapers and diaper rash creams as needed. ?Dementia: ?Went to the ER 05/18/2021, mental status changes, less interactive, did not like to get out of bed. ?Work-up included a chest x-ray, CT head, CMP, CBC, UA: All nonacute. ?She is back to baseline ?Cutaneous horn: See last visit, was referred to dermatology for a skin lesion on the nose, no action was taken, on exam today the area seems to have elevated borders, BCC?Marland Kitchen  Plan: Refer to plastic surgery. ?RTC as scheduled for June ? ?  ?

## 2021-06-25 NOTE — Assessment & Plan Note (Signed)
Urinary incontinence: As described above, explained the patient's husband that this is common in patients with dementia, she is not doing that intentionally (as he believed) ?No UTI symptoms however for completeness we will check a UA, urine culture (if possible). ?Recommend the use of diapers and diaper rash creams as needed. ?Dementia: ?Went to the ER 05/18/2021, mental status changes, less interactive, did not like to get out of bed. ?Work-up included a chest x-ray, CT head, CMP, CBC, UA: All nonacute. ?She is back to baseline ?Cutaneous horn: See last visit, was referred to dermatology for a skin lesion on the nose, no action was taken, on exam today the area seems to have elevated borders, BCC?Marland Kitchen  Plan: Refer to plastic surgery. ?RTC as scheduled for June ?

## 2021-06-29 ENCOUNTER — Other Ambulatory Visit: Payer: Self-pay | Admitting: Internal Medicine

## 2021-07-01 ENCOUNTER — Other Ambulatory Visit (INDEPENDENT_AMBULATORY_CARE_PROVIDER_SITE_OTHER): Payer: Medicare Other

## 2021-07-01 DIAGNOSIS — R32 Unspecified urinary incontinence: Secondary | ICD-10-CM

## 2021-07-02 LAB — URINALYSIS, ROUTINE W REFLEX MICROSCOPIC
Bilirubin Urine: NEGATIVE
Hgb urine dipstick: NEGATIVE
Ketones, ur: NEGATIVE
Leukocytes,Ua: NEGATIVE
Nitrite: NEGATIVE
RBC / HPF: NONE SEEN (ref 0–?)
Specific Gravity, Urine: 1.01 (ref 1.000–1.030)
Total Protein, Urine: NEGATIVE
Urine Glucose: NEGATIVE
Urobilinogen, UA: 0.2 (ref 0.0–1.0)
pH: 6 (ref 5.0–8.0)

## 2021-07-02 LAB — URINE CULTURE
MICRO NUMBER:: 13377466
SPECIMEN QUALITY:: ADEQUATE

## 2021-07-10 ENCOUNTER — Other Ambulatory Visit: Payer: Self-pay

## 2021-07-10 DIAGNOSIS — E119 Type 2 diabetes mellitus without complications: Secondary | ICD-10-CM

## 2021-07-10 MED ORDER — OZEMPIC (2 MG/DOSE) 8 MG/3ML ~~LOC~~ SOPN: 2.0000 mg | PEN_INJECTOR | SUBCUTANEOUS | 3 refills | Status: DC

## 2021-07-13 ENCOUNTER — Ambulatory Visit: Payer: Medicare Other | Admitting: Endocrinology

## 2021-07-16 ENCOUNTER — Ambulatory Visit: Payer: Medicare Other

## 2021-07-16 ENCOUNTER — Telehealth: Payer: Self-pay | Admitting: *Deleted

## 2021-07-16 ENCOUNTER — Telehealth: Payer: Medicare Other

## 2021-07-16 DIAGNOSIS — E119 Type 2 diabetes mellitus without complications: Secondary | ICD-10-CM

## 2021-07-16 DIAGNOSIS — F03C18 Unspecified dementia, severe, with other behavioral disturbance: Secondary | ICD-10-CM

## 2021-07-16 NOTE — Chronic Care Management (AMB) (Signed)
  Chronic Care Management   Note  07/16/2021 Name: Angel Green MRN: [id number] DOB: Aug 14, 1940  Angel Green is a 81 y.o. year old female who is a primary care patient of Larose Kells, Alda Berthold, MD. Angel Green is currently enrolled in care management services. An additional referral for Pharm D was placed.   Follow up plan: Unsuccessful telephone outreach attempt made. A HIPAA compliant phone message was left for the patient providing contact information and requesting a return call.   Julian Hy, [facility]  Direct Dial: [phone]

## 2021-07-16 NOTE — Patient Instructions (Signed)
Visit Information  Thank you for taking time to visit with me today. Please don't hesitate to contact me if I can be of assistance to you before our next scheduled telephone appointment.  Following are the goals we discussed today:  Patient Goals/Self-Care Activities: Take medications as prescribed   Attend all scheduled provider appointments Call pharmacy for medication refills 3-7 days in advance of running out of medications Call provider office for new concerns or questions  Check and record blood sugar as recommended by provider per Endocrinologist twice a day Review education regarding diabetes and plan to discuss at next telephone call   Our next appointment is by telephone on 08/20/21 at 10:00 am  Please call the care guide team at [phone] if you need to cancel or reschedule your appointment.   If you are experiencing a Mental Health or [place] or need someone to talk to, please call the Suicide and Crisis Lifeline: 988 call 1-800-273-TALK (toll free, 24 hour hotline)   Following is a copy of your full plan of care:  Care Plan : RN Care Management Plan of Care  Updates made by Angel Rued, RN since 07/16/2021 12:00 AM     Problem: No Plan of Care Established for Managment of Chronic Disease and/or Care Coordination Needs   Priority: High     Long-Range Goal: Developement of Plan of Care for Chronic Disease Managment and/or Care Coodiation needs   Start Date: 07/16/2021  Expected End Date: 01/16/2022  Priority: High  Note:   Current Barriers: RNCM spoke with Angel Green). He reports patient is asleep at this time. Patient with history of Dementia, HTN, DM. He reports patient memory is poor. He manages her medications and prepares meals. He states currently her niece is assisting with bathing and dressing and other personal care as needed. Mr. Angel Green reports he would like to get patient's blood sugar better managed. He reports difficulty obtaining  Trulicity and states that he had difficulty obtaining Trulicity therefore he gave her Trulicity he had which was 3.'5mg'$  (last given Saturday 07/11/21). He reports he received notification that Ozempic is coming in the mail by the end of the week and will transition to [place].  She is transitioning Endocrinology provider from Angel Green (last office visit 05/07/21) to Angel Green scheduled for 09/21/21. Knowledge Deficits related to plan of care for management of DMII  Chronic Disease Management support and education needs related to DMII Cognitive Deficits  RNCM Clinical Goal(s):  Patient will verbalize understanding of plan for management of DMII as evidenced by patient/caregiver report attend all scheduled medical appointments:   as evidenced by self report and/or chart notation        demonstrate improved adherence to prescribed treatment plan for DMII as evidenced by improved CBG, decreased A1C, taking medications as prescribed, attending provider visits as scheduled work with pharmacist to address medications related to DMII as evidenced by review of EMR and patient or pharmacist report    through collaboration with RN Care manager, provider, and care team.   Interventions: 1:1 collaboration with primary care provider regarding plan of care Inter-disciplinary care team collaboration (see longitudinal plan of care) Evaluation of current treatment plan related to  self management and patient's adherence to plan as established by provider  Diabetes Interventions:  (Status:  New goal.) Long Term Goal Assessed patient's understanding of A1c goal: <7% Provided education to patient about basic DM disease process Reviewed medications with patient and discussed importance of medication  adherence Discussed plans with patient for ongoing care management follow up and provided patient with direct contact information for care management team Reviewed scheduled/upcoming provider appointments  including: Angel Green 07/31/21 re: cutaneous horn; PCP 08/19/21; start of care with new Endocrinologist 09/21/21 Referral made to pharmacy team for assistance with  DM Management-review of medications, procurement, adherence Lab Results  Component Value Date   HGBA1C 8.8 (A) 05/07/2021    Patient Goals/Self-Care Activities: Take medications as prescribed   Attend all scheduled provider appointments Call pharmacy for medication refills 3-7 days in advance of running out of medications Call provider office for new concerns or questions  Check and record blood sugar as recommended by provider per Endocrinologist twice a day Review education regarding diabetes and plan to discuss at next telephone call      [name] was given information about Care Management services by the embedded care coordination team including:  Care Management services include personalized support from designated clinical staff supervised by her physician, including individualized plan of care and coordination with other care providers 24/7 contact phone numbers for assistance for urgent and routine care needs. The patient may stop CCM services at any time (effective at the end of the month) by phone call to the office staff.  Patient agreed to services and verbal consent obtained.   The patient verbalized understanding of instructions, educational materials, and care plan provided today and agreed to receive a mailed copy of patient instructions, educational materials, and care plan.   Telephone follow up appointment with care management team member scheduled for: The patient has been provided with contact information for the care management team and has been advised to call with any health related questions or concerns.   Angel Silversmith, RN, MSN, BSN, CCM Care Management Coordinator [facility] [phone]   Diabetes Mellitus and Nutrition, Adult When you have diabetes, or diabetes mellitus, it is  very important to have healthy eating habits because your blood sugar (glucose) levels are greatly affected by what you eat and drink. Eating healthy foods in the right amounts, at about the same times every day, can help you: Manage your blood glucose. Lower your risk of heart disease. Improve your blood pressure. Reach or maintain a healthy weight. What can affect my meal plan? Every person with diabetes is different, and each person has different needs for a meal plan. Your health care provider may recommend that you work with a dietitian to make a meal plan that is best for you. Your meal plan may vary depending on factors such as: The calories you need. The medicines you take. Your weight. Your blood glucose, blood pressure, and cholesterol levels. Your activity level. Other health conditions you have, such as heart or kidney disease. How do carbohydrates affect me? Carbohydrates, also called carbs, affect your blood glucose level more than any other type of food. Eating carbs raises the amount of glucose in your blood. It is important to know how many carbs you can safely have in each meal. This is different for every person. Your dietitian can help you calculate how many carbs you should have at each meal and for each snack. How does alcohol affect me? Alcohol can cause a decrease in blood glucose (hypoglycemia), especially if you use insulin or take certain diabetes medicines by mouth. Hypoglycemia can be a life-threatening condition. Symptoms of hypoglycemia, such as sleepiness, dizziness, and confusion, are similar to symptoms of having too much alcohol. Do not drink alcohol if:  Your health care provider tells you not to drink. You are pregnant, may be pregnant, or are planning to become pregnant. If you drink alcohol: Limit how much you have to: 0-1 drink a day for women. 0-2 drinks a day for men. Know how much alcohol is in your drink. In the [place] one drink equals one 12 oz  bottle of beer (355 mL), one 5 oz glass of wine (148 mL), or one 1 oz glass of hard liquor (44 mL). Keep yourself hydrated with water, diet soda, or unsweetened iced tea. Keep in mind that regular soda, juice, and other mixers may contain a lot of sugar and must be counted as carbs. What are tips for following this plan?  Reading food labels Start by checking the serving size on the Nutrition Facts label of packaged foods and drinks. The number of calories and the amount of carbs, fats, and other nutrients listed on the label are based on one serving of the item. Many items contain more than one serving per package. Check the total grams (g) of carbs in one serving. Check the number of grams of saturated fats and trans fats in one serving. Choose foods that have a low amount or none of these fats. Check the number of milligrams (mg) of salt (sodium) in one serving. Most people should limit total sodium intake to less than 2,300 mg per day. Always check the nutrition information of foods labeled as "low-fat" or "nonfat." These foods may be higher in added sugar or refined carbs and should be avoided. Talk to your dietitian to identify your daily goals for nutrients listed on the label. Shopping Avoid buying canned, pre-made, or processed foods. These foods tend to be high in fat, sodium, and added sugar. Shop around the outside edge of the grocery store. This is where you will most often find fresh fruits and vegetables, bulk grains, fresh meats, and fresh dairy products. Cooking Use low-heat cooking methods, such as baking, instead of high-heat cooking methods, such as deep frying. Cook using healthy oils, such as olive, canola, or sunflower oil. Avoid cooking with butter, cream, or high-fat meats. Meal planning Eat meals and snacks regularly, preferably at the same times every day. Avoid going long periods of time without eating. Eat foods that are high in fiber, such as fresh fruits,  vegetables, beans, and whole grains. Eat 4-6 oz (112-168 g) of lean protein each day, such as lean meat, chicken, fish, eggs, or tofu. One ounce (oz) (28 g) of lean protein is equal to: 1 oz (28 g) of meat, chicken, or fish. 1 egg.  cup (62 g) of tofu. Eat some foods each day that contain healthy fats, such as avocado, nuts, seeds, and fish. What foods should I eat? Fruits Berries. Apples. Oranges. Peaches. Apricots. Plums. Grapes. Mangoes. Papayas. Pomegranates. Kiwi. Cherries. Vegetables Leafy greens, including lettuce, spinach, kale, chard, collard greens, mustard greens, and cabbage. Beets. Cauliflower. Broccoli. Carrots. Green beans. Tomatoes. Peppers. Onions. Cucumbers. Brussels sprouts. Grains Whole grains, such as whole-wheat or whole-grain bread, crackers, tortillas, cereal, and pasta. Unsweetened oatmeal. Quinoa. Brown or wild rice. Meats and other proteins Seafood. Poultry without skin. Lean cuts of poultry and beef. Tofu. Nuts. Seeds. Dairy Low-fat or fat-free dairy products such as milk, yogurt, and cheese. The items listed above may not be a complete list of foods and beverages you can eat and drink. Contact a dietitian for more information. What foods should I avoid? Fruits Fruits canned with syrup. Vegetables Canned  vegetables. Frozen vegetables with butter or cream sauce. Grains Refined white flour and flour products such as bread, pasta, snack foods, and cereals. Avoid all processed foods. Meats and other proteins Fatty cuts of meat. Poultry with skin. Breaded or fried meats. Processed meat. Avoid saturated fats. Dairy Full-fat yogurt, cheese, or milk. Beverages Sweetened drinks, such as soda or iced tea. The items listed above may not be a complete list of foods and beverages you should avoid. Contact a dietitian for more information. Questions to ask a health care provider Do I need to meet with a certified diabetes care and education specialist? Do I need to  meet with a dietitian? What number can I call if I have questions? When are the best times to check my blood glucose? Where to find more information: American Diabetes Association: diabetes.org Academy of Nutrition and Dietetics: eatright.[employer] of Diabetes and Digestive and Kidney Diseases: [employer] Association of Diabetes Care & Education Specialists: diabeteseducator.org Summary It is important to have healthy eating habits because your blood sugar (glucose) levels are greatly affected by what you eat and drink. It is important to use alcohol carefully. A healthy meal plan will help you manage your blood glucose and lower your risk of heart disease. Your health care provider may recommend that you work with a dietitian to make a meal plan that is best for you. This information is not intended to replace advice given to you by your health care provider. Make sure you discuss any questions you have with your health care provider. Document Revised: 09/12/2019 Document Reviewed: 09/12/2019 Elsevier Patient Education  [place].

## 2021-07-16 NOTE — Chronic Care Management (AMB) (Signed)
Care Management    RN Visit Note  07/16/2021 Name: Angel Green MRN: [id number] DOB: 1940/06/26  Subjective: Angel Green is a 81 y.o. year old female who is a primary care patient of Wanda Plump, MD. The care management team was consulted for assistance with disease management and care coordination needs.    Engaged with patient by telephone for initial visit in response to provider referral for case management and/or care coordination services.   Consent to Services:   Angel Green was given information about Care Management services today including:  Care Management services includes personalized support from designated clinical staff supervised by her physician, including individualized plan of care and coordination with other care providers 24/7 contact phone numbers for assistance for urgent and routine care needs. The patient may stop case management services at any time by phone call to the office staff.  Patient agreed to services and consent obtained.   Assessment: Review of patient past medical history, allergies, medications, health status, including review of consultants reports, laboratory and other test data, was performed as part of comprehensive evaluation and provision of chronic care management services.   SDOH (Social Determinants of Health) assessments and interventions performed:  SDOH Interventions    Flowsheet Row Most Recent Value  SDOH Interventions   Food Insecurity Interventions Intervention Not Indicated  Transportation Interventions Intervention Not Indicated        Care Plan  Allergies  Allergen Reactions   Dust Mite Mixed Allergen Ext [Mite (D. Farinae)]     Outpatient Encounter Medications as of 07/16/2021  Medication Sig Note   amLODipine (NORVASC) 5 MG tablet TAKE 1 TABLET DAILY    Apoaequorin 20 MG CAPS Take 20 mg by mouth daily.    aspirin 81 MG tablet Take 81 mg by mouth daily.    atorvastatin (LIPITOR) 10 MG tablet TAKE 1 TABLET  DAILY    Cyanocobalamin (B-12 PO) Take 1 tablet by mouth daily. Takes 5000 mcg by mouth daily.    donepezil (ARICEPT) 10 MG tablet Take 1 tablet (10 mg total) by mouth at bedtime.    Dulaglutide (TRULICITY [place]) Inject 1 Dose into the skin. Reports 3.5 mg weekly. Last taken Saturday 07/11/21    DULoxetine (CYMBALTA) 20 MG capsule TAKE 1 CAPSULE DAILY    fluticasone (FLOVENT HFA) 44 MCG/ACT inhaler Inhale 2 puffs into the lungs 2 (two) times daily. 07/16/2021: Takes as needed   furosemide (LASIX) 20 MG tablet TAKE 2 TABLETS DAILY    glucose blood (FREESTYLE LITE) test strip Use to monitor your blood sugars 4 times per day; E11.9    Insulin Lispro Prot & Lispro (HUMALOG MIX 75/25 KWIKPEN) (75-25) 100 UNIT/ML Kwikpen Inject 60 Units into the skin daily with breakfast. And pen needles 1/day    loratadine (CLARITIN) 10 MG tablet TAKE 1 TABLET DAILY    losartan (COZAAR) 50 MG tablet TAKE 1 TABLET DAILY    memantine (NAMENDA) 10 MG tablet Take 1 tablet (10 mg total) by mouth 2 (two) times daily.    Multiple Vitamins-Minerals (CENTRUM SILVER PO) Take 1 tablet by mouth daily.    omeprazole (PRILOSEC) 20 MG capsule Take 1 capsule (20 mg total) by mouth daily.    Polyethyl Glycol-Propyl Glycol 0.4-0.3 % SOLN Apply 1 drop to eye daily as needed (for dry eyes).    potassium chloride (KLOR-CON) 10 MEQ tablet TAKE 1 TABLET DAILY    QUEtiapine (SEROQUEL) 25 MG tablet Take 1 tablet (25 mg total) by mouth at bedtime.  Blood Glucose Monitoring Suppl (FREESTYLE LITE) DEVI Use to monitor your blood sugars    Blood Glucose Monitoring Suppl (ONE TOUCH ULTRA 2) w/Device KIT USE AS DIRECTED    Continuous Blood Gluc Sensor (FREESTYLE LIBRE 2 SENSOR) MISC 1 Device by Does not apply route every 14 (fourteen) days. (Patient not taking: Reported on 07/16/2021)    EPINEPHrine 0.3 mg/0.3 mL IJ SOAJ injection Inject 0.3 mg into the muscle once. Reported on 06/30/2015 (Patient not taking: Reported on 06/24/2021) 09/21/2016: PRN    glucose blood (ONETOUCH ULTRA) test strip Use as instructed (Patient not taking: Reported on 07/16/2021)    Lancets (FREESTYLE) lancets Use to monitor blood sugars 4 times per day, once before each meal and once at bedtime    Lancets (ONETOUCH ULTRASOFT) lancets Use as instructed    mupirocin cream (BACTROBAN) 2 % Apply 1 application topically daily. (Patient not taking: Reported on 07/16/2021)    Semaglutide, 2 MG/DOSE, (OZEMPIC, 2 MG/DOSE,) 8 MG/3ML SOPN Inject 2 mg into the skin once a week. (Patient not taking: Reported on 07/16/2021)    SURE COMFORT PEN NEEDLES 31G X 5 MM MISC Use to inject insulin 1 time per day.    No facility-administered encounter medications on file as of 07/16/2021.    Patient Active Problem List   Diagnosis Date Noted   PAD (peripheral artery disease) ([place]) 04/21/2020   Numbness 03/26/2020   Dementia ([place]) 08/14/2017   Cognitive impairment 09/22/2016   Morbid obesity ([place]) 06/30/2015   Diabetes ([place]) 04/23/2015   PCP NOTES >>>>> 12/28/2014   Leg ulcer, left ([place]) 10/12/2013   Annual physical exam 12/14/2011   Angioedema 10/22/2011   Tracheobronchitis, chronic ([place]) 08/20/2010   Fatigue 05/22/2010   CKD (chronic kidney disease) 04/16/2010   GERD 09/30/2008   EDEMA 06/10/2008   Hyperlipidemia 01/25/2007   ANKLE PAIN, CHRONIC 12/08/2006   Seasonal and perennial allergic rhinitis 10/20/2006   HTN (hypertension) 08/30/2006   OSTEOARTHROSIS, GENERALIZED, MULTIPLE SITES 08/30/2006    Conditions to be addressed/monitored: DMII  Care Plan : RN Care Management Plan of Care  Updates made by Angel Rued, RN since 07/16/2021 12:00 AM     Problem: No Plan of Care Established for Managment of Chronic Disease and/or Care Coordination Needs   Priority: High     Long-Range Goal: Developement of Plan of Care for Chronic Disease Managment and/or Care Coodiation needs   Start Date: 07/16/2021  Expected End Date: 01/16/2022  Priority: High  Note:   Current  Barriers: RNCM spoke with Mr. Blunck). He reports patient is asleep at this time. Patient with history of Dementia, HTN, DM. He reports patient memory is poor. He manages her medications and prepares meals. He states currently her niece is assisting with bathing and dressing and other personal care as needed. Mr. Lenny Pastel reports he would like to get patient's blood sugar better managed. He reports difficulty obtaining Trulicity and states that he had difficulty obtaining Trulicity therefore he gave her Trulicity he had which was 3.$RemoveBef'5mg'HGUcomTtec$  (last given Saturday 07/11/21). He reports he received notification that Ozempic is coming in the mail by the end of the week and will transition to [place].  She is transitioning Endocrinology provider from Dr. Loanne Drilling (last office visit 05/07/21) to Dr. Jaclynn Guarneri scheduled for 09/21/21. Knowledge Deficits related to plan of care for management of DMII  Chronic Disease Management support and education needs related to DMII Cognitive Deficits  RNCM Clinical Goal(s):  Patient will verbalize understanding of plan for management  of DMII as evidenced by patient/caregiver report attend all scheduled medical appointments:   as evidenced by self report and/or chart notation        demonstrate improved adherence to prescribed treatment plan for DMII as evidenced by improved CBG, decreased A1C, taking medications as prescribed, attending provider visits as scheduled work with pharmacist to address medications related to DMII as evidenced by review of EMR and patient or pharmacist report    through collaboration with RN Care manager, provider, and care team.   Interventions: 1:1 collaboration with primary care provider regarding plan of care Inter-disciplinary care team collaboration (see longitudinal plan of care) Evaluation of current treatment plan related to  self management and patient's adherence to plan as established by provider  Diabetes Interventions:  (Status:   New goal.) Long Term Goal Assessed patient's understanding of A1c goal: <7% Provided education to patient about basic DM disease process Reviewed medications with patient and discussed importance of medication adherence Discussed plans with patient for ongoing care management follow up and provided patient with direct contact information for care management team Reviewed scheduled/upcoming provider appointments including: Dr. Melvenia Needles 07/31/21 re: cutaneous horn; PCP 08/19/21; start of care with new Endocrinologist 09/21/21 Referral made to pharmacy team for assistance with  DM Management-review of medications, procurement, reconciliation, adherence Lab Results  Component Value Date   HGBA1C 8.8 (A) 05/07/2021    Patient Goals/Self-Care Activities: Take medications as prescribed   Attend all scheduled provider appointments Call pharmacy for medication refills 3-7 days in advance of running out of medications Call provider office for new concerns or questions  Check and record blood sugar as recommended by provider per Endocrinologist twice a day Review education regarding diabetes and plan to discuss at next telephone call    Plan: The care management team will reach out to the patient again over the next 45  days.  Thea Silversmith, RN, MSN, BSN, CCM Care Management Coordinator [facility] [phone]

## 2021-07-31 ENCOUNTER — Ambulatory Visit (INDEPENDENT_AMBULATORY_CARE_PROVIDER_SITE_OTHER): Payer: Medicare Other | Admitting: Plastic Surgery

## 2021-07-31 VITALS — BP 134/84 | HR 89 | Temp 98.0°F | Resp 18 | Ht 64.0 in | Wt 221.0 lb

## 2021-07-31 DIAGNOSIS — D489 Neoplasm of uncertain behavior, unspecified: Secondary | ICD-10-CM | POA: Diagnosis not present

## 2021-07-31 NOTE — Progress Notes (Signed)
Referring Provider Colon Branch, MD [address] [place],  [place] [address]   CC: Right nose and left shin skin lesions   Angel Green is an 81 y.o. female.  HPI: Patient is an 81 year old who has a history of skin lesions.  She has a spot on her nose on the right side of the tip and under her chin just to the left of midline.  The 1 on the chin has been present for a long time and the one on her nose has been present for months.  She was referred by her primary care doctor.  Apparently she saw dermatology but lesion was not biopsied.    Allergies  Allergen Reactions   Dust Mite Mixed Allergen Ext [Mite (D. Farinae)]     Outpatient Encounter Medications as of 07/31/2021  Medication Sig Note   amLODipine (NORVASC) 5 MG tablet TAKE 1 TABLET DAILY    Apoaequorin 20 MG CAPS Take 20 mg by mouth daily.    aspirin 81 MG tablet Take 81 mg by mouth daily.    atorvastatin (LIPITOR) 10 MG tablet TAKE 1 TABLET DAILY    Blood Glucose Monitoring Suppl (FREESTYLE LITE) DEVI Use to monitor your blood sugars    Blood Glucose Monitoring Suppl (ONE TOUCH ULTRA 2) w/Device KIT USE AS DIRECTED    Continuous Blood Gluc Sensor (FREESTYLE LIBRE 2 SENSOR) MISC 1 Device by Does not apply route every 14 (fourteen) days. (Patient not taking: Reported on 07/16/2021)    Cyanocobalamin (B-12 PO) Take 1 tablet by mouth daily. Takes 5000 mcg by mouth daily.    donepezil (ARICEPT) 10 MG tablet Take 1 tablet (10 mg total) by mouth at bedtime.    Dulaglutide (TRULICITY [place]) Inject 1 Dose into the skin. Reports 3.5 mg weekly. Last taken Saturday 07/11/21    DULoxetine (CYMBALTA) 20 MG capsule TAKE 1 CAPSULE DAILY    EPINEPHrine 0.3 mg/0.3 mL IJ SOAJ injection Inject 0.3 mg into the muscle once. Reported on 06/30/2015 (Patient not taking: Reported on 06/24/2021) 09/21/2016: PRN   fluticasone (FLOVENT HFA) 44 MCG/ACT inhaler Inhale 2 puffs into the lungs 2 (two) times daily. 07/16/2021: Takes as needed    furosemide (LASIX) 20 MG tablet TAKE 2 TABLETS DAILY    glucose blood (FREESTYLE LITE) test strip Use to monitor your blood sugars 4 times per day; E11.9    glucose blood (ONETOUCH ULTRA) test strip Use as instructed (Patient not taking: Reported on 07/16/2021)    Insulin Lispro Prot & Lispro (HUMALOG MIX 75/25 KWIKPEN) (75-25) 100 UNIT/ML Kwikpen Inject 60 Units into the skin daily with breakfast. And pen needles 1/day    Lancets (FREESTYLE) lancets Use to monitor blood sugars 4 times per day, once before each meal and once at bedtime    Lancets (ONETOUCH ULTRASOFT) lancets Use as instructed    loratadine (CLARITIN) 10 MG tablet TAKE 1 TABLET DAILY    losartan (COZAAR) 50 MG tablet TAKE 1 TABLET DAILY    memantine (NAMENDA) 10 MG tablet Take 1 tablet (10 mg total) by mouth 2 (two) times daily.    Multiple Vitamins-Minerals (CENTRUM SILVER PO) Take 1 tablet by mouth daily.    mupirocin cream (BACTROBAN) 2 % Apply 1 application topically daily. (Patient not taking: Reported on 07/16/2021)    omeprazole (PRILOSEC) 20 MG capsule Take 1 capsule (20 mg total) by mouth daily.    Polyethyl Glycol-Propyl Glycol 0.4-0.3 % SOLN Apply 1 drop to eye daily as needed (  for dry eyes).    potassium chloride (KLOR-CON) 10 MEQ tablet TAKE 1 TABLET DAILY    QUEtiapine (SEROQUEL) 25 MG tablet Take 1 tablet (25 mg total) by mouth at bedtime.    Semaglutide, 2 MG/DOSE, (OZEMPIC, 2 MG/DOSE,) 8 MG/3ML SOPN Inject 2 mg into the skin once a week. (Patient not taking: Reported on 07/16/2021)    SURE COMFORT PEN NEEDLES 31G X 5 MM MISC Use to inject insulin 1 time per day.    No facility-administered encounter medications on file as of 07/31/2021.     Past Medical History:  Diagnosis Date   Allergy    Dust Mites   Ankle pain    chronic   Diabetes mellitus, type 2 ([place])    Hyperlipidemia    Hypertension    Memory loss    Osteoarthritis    RENAL INSUFFICIENCY, CHRONIC 04/16/2010   Rhinitis    allergic nos    Past  Surgical History:  Procedure Laterality Date   CATARACT EXTRACTION     CHOLECYSTECTOMY     dental implants     PARTIAL HYSTERECTOMY     in her 70s    Family History  Problem Relation Age of Onset   Healthy Brother    Healthy Son    Healthy Son    Other Mother        unsure of history - "old age"   Other Father        unsure of history - "old age"   Heart attack Neg Hx    Colon cancer Neg Hx    Breast cancer Neg Hx    Stroke Neg Hx     Social History   Social History Narrative   Husband is a Company secretary.   Right-handed.   1 cup caffeine daily.   Lives at home with husband.            Review of Systems General: Denies fevers, chills, weight loss CV: Denies chest pain, shortness of breath, palpitations   Physical Exam    07/31/2021    9:11 AM 06/24/2021    2:54 PM 05/18/2021    4:13 PM  Vitals with BMI  Height 5' 4" 5' 5"   Weight 221 lbs 228 lbs 6 oz   BMI 71.21 38   Systolic 975 883 254  Diastolic 84 74 73  Pulse 89 94 81    General:  No acute distress,  Alert and oriented, Non-Toxic, Normal speech and affect HEENT: Greater than 1 cm lesion right nose, raised, irregular borders.  Chin lesion under chin, approximately 1 cm to the left of midline  Assessment/Plan Right nose lesion and left chin lesion.  Cannot rule out malignancy on physical exam.  I discussed the recommendation of biopsy and the patient agrees that we will biopsy both lesions in the office and then form a plan after that.    Lennice Sites 07/31/2021, 9:55 AM

## 2021-08-06 ENCOUNTER — Telehealth: Payer: Self-pay | Admitting: *Deleted

## 2021-08-06 ENCOUNTER — Telehealth: Payer: Self-pay | Admitting: Internal Medicine

## 2021-08-06 NOTE — Telephone Encounter (Signed)
[name] (spouse) called stating that pt will not be able to make their CCM Telephone Call on 6.29.23 @ 10a. Appt has been cancelled but pt needs to be Rs'd when possible.

## 2021-08-06 NOTE — Chronic Care Management (AMB) (Unsigned)
  Chronic Care Management Note  08/06/2021 Name: Angel Green MRN: [id number] DOB: [birth year]  Angel Green is a 81 y.o. year old female who is a primary care patient of Colon Branch, MD and is actively engaged with the care management team. I reached out to [employer] by phone today to assist with re-scheduling a follow up visit with the RN Case Manager  Follow up plan: Unsuccessful telephone outreach attempt made. A HIPAA compliant phone message was left for the patient providing contact information and requesting a return call.   Julian Hy, [place] Management  Direct Dial: [phone]

## 2021-08-11 ENCOUNTER — Emergency Department (HOSPITAL_COMMUNITY): Payer: Medicare Other

## 2021-08-11 ENCOUNTER — Emergency Department (HOSPITAL_COMMUNITY)
Admission: EM | Admit: 2021-08-11 | Discharge: 2021-08-11 | Disposition: A | Discharge status: Home / Self Care | Payer: Medicare Other | Attending: Emergency Medicine | Admitting: Emergency Medicine

## 2021-08-11 DIAGNOSIS — R410 Disorientation, unspecified: Secondary | ICD-10-CM | POA: Diagnosis not present

## 2021-08-11 DIAGNOSIS — F0390 Unspecified dementia without behavioral disturbance: Secondary | ICD-10-CM | POA: Insufficient documentation

## 2021-08-11 DIAGNOSIS — E119 Type 2 diabetes mellitus without complications: Secondary | ICD-10-CM | POA: Diagnosis not present

## 2021-08-11 DIAGNOSIS — Z79899 Other long term (current) drug therapy: Secondary | ICD-10-CM | POA: Insufficient documentation

## 2021-08-11 DIAGNOSIS — Z7982 Long term (current) use of aspirin: Secondary | ICD-10-CM | POA: Insufficient documentation

## 2021-08-11 DIAGNOSIS — E161 Other hypoglycemia: Secondary | ICD-10-CM | POA: Diagnosis not present

## 2021-08-11 DIAGNOSIS — E162 Hypoglycemia, unspecified: Secondary | ICD-10-CM | POA: Diagnosis not present

## 2021-08-11 DIAGNOSIS — R4701 Aphasia: Secondary | ICD-10-CM | POA: Diagnosis not present

## 2021-08-11 DIAGNOSIS — R4182 Altered mental status, unspecified: Secondary | ICD-10-CM | POA: Diagnosis not present

## 2021-08-11 DIAGNOSIS — R404 Transient alteration of awareness: Secondary | ICD-10-CM

## 2021-08-11 DIAGNOSIS — Z794 Long term (current) use of insulin: Secondary | ICD-10-CM | POA: Insufficient documentation

## 2021-08-11 DIAGNOSIS — I10 Essential (primary) hypertension: Secondary | ICD-10-CM | POA: Diagnosis not present

## 2021-08-11 DIAGNOSIS — R531 Weakness: Secondary | ICD-10-CM | POA: Diagnosis not present

## 2021-08-11 LAB — URINALYSIS, ROUTINE W REFLEX MICROSCOPIC
Bilirubin Urine: NEGATIVE
Glucose, UA: NEGATIVE mg/dL
Hgb urine dipstick: NEGATIVE
Ketones, ur: NEGATIVE mg/dL
Leukocytes,Ua: NEGATIVE
Nitrite: NEGATIVE
Protein, ur: NEGATIVE mg/dL
Specific Gravity, Urine: 1.01 (ref 1.005–1.030)
pH: 7 (ref 5.0–8.0)

## 2021-08-11 LAB — COMPREHENSIVE METABOLIC PANEL WITH GFR
ALT: 23 U/L (ref 0–44)
AST: 32 U/L (ref 15–41)
Albumin: 3.3 g/dL — ABNORMAL LOW (ref 3.5–5.0)
Alkaline Phosphatase: 71 U/L (ref 38–126)
Anion gap: 11 (ref 5–15)
BUN: 9 mg/dL (ref 8–23)
CO2: 28 mmol/L (ref 22–32)
Calcium: 9.5 mg/dL (ref 8.9–10.3)
Chloride: 103 mmol/L (ref 98–111)
Creatinine, Ser: 1.46 mg/dL — ABNORMAL HIGH (ref 0.44–1.00)
GFR, Estimated: 35 mL/min — ABNORMAL LOW (ref >60)
Glucose, Bld: 89 mg/dL (ref 70–99)
Potassium: 4.4 mmol/L (ref 3.5–5.1)
Sodium: 142 mmol/L (ref 135–145)
Total Bilirubin: 0.8 mg/dL (ref 0.3–1.2)
Total Protein: 7.2 g/dL (ref 6.5–8.1)

## 2021-08-11 LAB — CBC WITH DIFFERENTIAL/PLATELET
Abs Immature Granulocytes: 0.01 10*3/uL (ref 0.00–0.07)
Basophils Absolute: 0 10*3/uL (ref 0.0–0.1)
Basophils Relative: 1 %
Eosinophils Absolute: 0.2 10*3/uL (ref 0.0–0.5)
Eosinophils Relative: 3 %
HCT: 44.4 % (ref 36.0–46.0)
Hemoglobin: 14.6 g/dL (ref 12.0–15.0)
Immature Granulocytes: 0 %
Lymphocytes Relative: 43 %
Lymphs Abs: 2.5 10*3/uL (ref 0.7–4.0)
MCH: 32.3 pg (ref 26.0–34.0)
MCHC: 32.9 g/dL (ref 30.0–36.0)
MCV: 98.2 fL (ref 80.0–100.0)
Monocytes Absolute: 0.4 10*3/uL (ref 0.1–1.0)
Monocytes Relative: 7 %
Neutro Abs: 2.6 10*3/uL (ref 1.7–7.7)
Neutrophils Relative %: 46 %
Platelets: 212 10*3/uL (ref 150–400)
RBC: 4.52 MIL/uL (ref 3.87–5.11)
RDW: 14.3 % (ref 11.5–15.5)
WBC: 5.7 10*3/uL (ref 4.0–10.5)
nRBC: 0 % (ref 0.0–0.2)

## 2021-08-11 LAB — RAPID URINE DRUG SCREEN, HOSP PERFORMED
Amphetamines: NOT DETECTED
Barbiturates: NOT DETECTED
Benzodiazepines: NOT DETECTED
Cocaine: NOT DETECTED
Opiates: NOT DETECTED
Tetrahydrocannabinol: NOT DETECTED

## 2021-08-11 LAB — I-STAT VENOUS BLOOD GAS, ED
Acid-Base Excess: 6 mmol/L — ABNORMAL HIGH (ref 0.0–2.0)
Bicarbonate: 33 mmol/L — ABNORMAL HIGH (ref 20.0–28.0)
Calcium, Ion: 1.18 mmol/L (ref 1.15–1.40)
HCT: 45 % (ref 36.0–46.0)
Hemoglobin: 15.3 g/dL — ABNORMAL HIGH (ref 12.0–15.0)
O2 Saturation: 58 %
Potassium: 4.5 mmol/L (ref 3.5–5.1)
Sodium: 140 mmol/L (ref 135–145)
TCO2: 35 mmol/L — ABNORMAL HIGH (ref 22–32)
pCO2, Ven: 55.1 mmHg (ref 44–60)
pH, Ven: 7.385 (ref 7.25–7.43)
pO2, Ven: 32 mmHg (ref 32–45)

## 2021-08-11 LAB — AMMONIA: Ammonia: 20 umol/L (ref 9–35)

## 2021-08-11 LAB — CBG MONITORING, ED: Glucose-Capillary: 112 mg/dL — ABNORMAL HIGH (ref 70–99)

## 2021-08-11 LAB — LACTIC ACID, PLASMA: Lactic Acid, Venous: 1.1 mmol/L (ref 0.5–1.9)

## 2021-08-11 NOTE — Discharge Instructions (Addendum)
The lab work and imaging studies done in the ED today showed normal results.  Follow-up with your neurology office next week.  Return to the emergency department for any return of concerning symptoms.

## 2021-08-11 NOTE — ED Notes (Signed)
Husband at bedside, reports they check sugars in the morning and manage with insulin, ozempic started 3 weeks ago, he reports she is back to baseline mental status

## 2021-08-11 NOTE — ED Notes (Signed)
Critical lab value given to RN

## 2021-08-11 NOTE — ED Triage Notes (Signed)
Pt BIB EMS from home for increased confusion and lethargy. Pt is type 2 DM and recently started Ozempic which caused decrease in appetite. CBG was 70 upon arrival, after PO intake CBG is 150 and ems reports pt is more alert then when they got there. Pt does have dementia at baseline

## 2021-08-11 NOTE — ED Notes (Signed)
Pt taken to mri

## 2021-08-11 NOTE — ED Provider Notes (Signed)
[facility] EMERGENCY DEPARTMENT Provider Note   CSN: [id number] Arrival date & time: 08/11/21  1357     History  Chief Complaint  Patient presents with   Altered Mental Status    Angel Green is a 81 y.o. female.   Altered Mental Status   81 year old female with medical history significant for dementia, alert and oriented x1 at baseline, HTN, DM 2, HLD who presents to the emergency department for an episode of confusion and lethargy.  She is a diabetic and was recently started on Ozempic and has had acute decreased appetite.  She had a 45-minute episode of aphasia where she would not speak according to her husband.  She appeared confused and tired at that time.  When EMS arrived, her CBG was notably 70 and following oral intake was improved to 158.  The patient had improvement and has since returned to her baseline neurologic status.  No facial droop, no numbness or weakness in the extremities noted by the patient's husband.  He states that she has back to her baseline.  Home Medications Prior to Admission medications   Medication Sig Start Date End Date Taking? Authorizing Provider  amLODipine (NORVASC) 5 MG tablet TAKE 1 TABLET DAILY 03/02/21   Colon Branch, MD  Apoaequorin 20 MG CAPS Take 20 mg by mouth daily.    [provider]  aspirin 81 MG tablet Take 81 mg by mouth daily.    [provider]  atorvastatin (LIPITOR) 10 MG tablet TAKE 1 TABLET DAILY 06/01/21   Colon Branch, MD  Blood Glucose Monitoring Suppl (FREESTYLE LITE) DEVI Use to monitor your blood sugars 12/26/17   Renato Shin, MD  Blood Glucose Monitoring Suppl (ONE TOUCH ULTRA 2) w/Device KIT USE AS DIRECTED 04/21/21   Renato Shin, MD  Continuous Blood Gluc Sensor (FREESTYLE LIBRE 2 SENSOR) MISC 1 Device by Does not apply route every 14 (fourteen) days. Patient not taking: Reported on 07/16/2021 03/04/21   Renato Shin, MD  Cyanocobalamin (B-12 PO) Take 1 tablet by mouth daily.  Takes 5000 mcg by mouth daily.    [provider]  donepezil (ARICEPT) 10 MG tablet Take 1 tablet (10 mg total) by mouth at bedtime. 05/20/21   Colon Branch, MD  Dulaglutide (TRULICITY [place]) Inject 1 Dose into the skin. Reports 3.5 mg weekly. Last taken Saturday 07/11/21    [provider]  DULoxetine (CYMBALTA) 20 MG capsule TAKE 1 CAPSULE DAILY 06/01/21   Colon Branch, MD  EPINEPHrine 0.3 mg/0.3 mL IJ SOAJ injection Inject 0.3 mg into the muscle once. Reported on 06/30/2015 Patient not taking: Reported on 06/24/2021    [provider]  fluticasone (FLOVENT HFA) 44 MCG/ACT inhaler Inhale 2 puffs into the lungs 2 (two) times daily. 07/19/13   Baird Lyons D, MD  furosemide (LASIX) 20 MG tablet TAKE 2 TABLETS DAILY 06/29/21   Colon Branch, MD  glucose blood (FREESTYLE LITE) test strip Use to monitor your blood sugars 4 times per day; E11.9 01/12/19   Renato Shin, MD  glucose blood (ONETOUCH ULTRA) test strip Use as instructed Patient not taking: Reported on 07/16/2021 04/21/21   Renato Shin, MD  Insulin Lispro Prot & Lispro (HUMALOG MIX 75/25 KWIKPEN) (75-25) 100 UNIT/ML Kwikpen Inject 60 Units into the skin daily with breakfast. And pen needles 1/day 05/07/21   Renato Shin, MD  Lancets (FREESTYLE) lancets Use to monitor blood sugars 4 times per day, once before each meal and once at  bedtime 12/26/17   Renato Shin, MD  Lancets Tops [facility] ULTRASOFT) lancets Use as instructed 04/21/21   Renato Shin, MD  loratadine (CLARITIN) 10 MG tablet TAKE 1 TABLET DAILY 03/30/21   Colon Branch, MD  losartan (COZAAR) 50 MG tablet TAKE 1 TABLET DAILY 06/01/21   Colon Branch, MD  memantine (NAMENDA) 10 MG tablet Take 1 tablet (10 mg total) by mouth 2 (two) times daily. 05/20/21   Colon Branch, MD  Multiple Vitamins-Minerals (CENTRUM SILVER PO) Take 1 tablet by mouth daily.    [provider]  mupirocin cream (BACTROBAN) 2 % Apply 1 application topically daily. Patient not taking: Reported on  07/16/2021 04/20/21   Colon Branch, MD  omeprazole (PRILOSEC) 20 MG capsule Take 1 capsule (20 mg total) by mouth daily. 02/07/18   Colon Branch, MD  Polyethyl Glycol-Propyl Glycol 0.4-0.3 % SOLN Apply 1 drop to eye daily as needed (for dry eyes).    [provider]  potassium chloride (KLOR-CON) 10 MEQ tablet TAKE 1 TABLET DAILY 06/01/21   Colon Branch, MD  QUEtiapine (SEROQUEL) 25 MG tablet Take 1 tablet (25 mg total) by mouth at bedtime. 05/20/21   Colon Branch, MD  Semaglutide, 2 MG/DOSE, (OZEMPIC, 2 MG/DOSE,) 8 MG/3ML SOPN Inject 2 mg into the skin once a week. Patient not taking: Reported on 07/16/2021 07/10/21   Elayne Snare, MD  SURE COMFORT PEN NEEDLES 31G X 5 MM MISC Use to inject insulin 1 time per day. 09/09/20   Renato Shin, MD      Allergies    Dust mite mixed allergen ext [mite (d. farinae)]    Review of Systems   Review of Systems  Unable to perform ROS: Dementia    Physical Exam Updated Vital Signs BP 125/71 (BP Location: Left Wrist)   Pulse 74   Temp 97.6 F (36.4 C) (Oral)   Resp 12   SpO2 96%  Physical Exam Vitals and nursing note reviewed.  Constitutional:      General: She is not in acute distress.    Appearance: She is well-developed.  HENT:     Head: Normocephalic and atraumatic.  Eyes:     Conjunctiva/sclera: Conjunctivae normal.  Cardiovascular:     Rate and Rhythm: Normal rate and regular rhythm.  Pulmonary:     Effort: Pulmonary effort is normal. No respiratory distress.     Breath sounds: Normal breath sounds.  Abdominal:     Palpations: Abdomen is soft.     Tenderness: There is no abdominal tenderness.  Musculoskeletal:        General: No swelling.     Cervical back: Neck supple.  Skin:    General: Skin is warm and dry.     Capillary Refill: Capillary refill takes less than 2 seconds.  Neurological:     Mental Status: She is alert.     GCS: GCS eye subscore is 4. GCS verbal subscore is 4. GCS motor subscore is 6.     Comments: MENTAL  STATUS EXAM:    Orientation: Alert and oriented to person, disoriented to place and time Memory: Cooperative, follows commands well.  Language: Speech is clear and language is normal.   CRANIAL NERVES:    CN 2 (Optic): Visual fields intact to confrontation.  CN 3,4,6 (EOM): Pupils equal and reactive to light. Full extraocular eye movement without nystagmus.  CN 5 (Trigeminal): Facial sensation is normal, no weakness of masticatory muscles.  CN 7 (Facial): No facial  weakness or asymmetry.  CN 8 (Auditory): Auditory acuity grossly normal.  CN 9,10 (Glossophar): The uvula is midline, the palate elevates symmetrically.  CN 11 (spinal access): Normal sternocleidomastoid and trapezius strength.  CN 12 (Hypoglossal): The tongue is midline. No atrophy or fasciculations.Marland Kitchen   MOTOR:  Muscle Strength: 5/5RUE, 5/5LUE, 5/5RLE, 5/5LLE.   COORDINATION:   Intact finger-to-nose, no tremor.   SENSATION:   Intact to light touch all four extremities.     Psychiatric:        Mood and Affect: Mood normal.     ED Results / Procedures / Treatments   Labs (all labs ordered are listed, but only abnormal results are displayed) Labs Reviewed  CBG MONITORING, ED - Abnormal; Notable for the following components:      Result Value   Glucose-Capillary 112 (*)    All other components within normal limits  COMPREHENSIVE METABOLIC PANEL  CBC WITH DIFFERENTIAL/PLATELET  URINALYSIS, ROUTINE W REFLEX MICROSCOPIC  AMMONIA  LACTIC ACID, PLASMA  RAPID URINE DRUG SCREEN, HOSP PERFORMED  I-STAT VENOUS BLOOD GAS, ED    EKG None  Radiology CT HEAD WO CONTRAST  Result Date: 08/11/2021 CLINICAL DATA:  Mental status change EXAM: CT HEAD WITHOUT CONTRAST TECHNIQUE: Contiguous axial images were obtained from the base of the skull through the vertex without intravenous contrast. RADIATION DOSE REDUCTION: This exam was performed according to the departmental dose-optimization program which includes automated exposure  control, adjustment of the mA and/or kV according to patient size and/or use of iterative reconstruction technique. COMPARISON:  CT brain 05/18/2021 FINDINGS: Brain: No acute territorial infarction, hemorrhage or intracranial mass. Atrophy and mild chronic small vessel ischemic changes of the white matter. Interval small amount of right temporal lobe encephalomalacia. Stable ventricle size. Vascular: No hyperdense vessels.  Carotid vascular calcification Skull: Normal. Negative for fracture or focal lesion. Sinuses/Orbits: No acute finding. Other: None IMPRESSION: 1. No CT evidence for acute intracranial abnormality. 2. Atrophy and chronic small vessel ischemic changes of white matter Electronically Signed   By: Donavan Foil M.D.   On: 08/11/2021 15:20   DG Chest Port 1 View  Result Date: 08/11/2021 CLINICAL DATA:  Altered mental status EXAM: PORTABLE CHEST 1 VIEW COMPARISON:  Previous studies including the examination 05/18/2021 FINDINGS: Transverse diameter of heart is slightly increased. Apparent shift of mediastinum to the right may be due to rotation. There is poor inspiration. There are no signs of alveolar pulmonary edema or focal pulmonary consolidation. There is no pleural effusion or pneumothorax. IMPRESSION: There are no signs of pulmonary edema or focal pulmonary consolidation. Electronically Signed   By: Elmer Picker M.D.   On: 08/11/2021 14:57    Procedures Procedures    Medications Ordered in ED Medications - No data to display  ED Course/ Medical Decision Making/ A&P                           Medical Decision Making Amount and/or Complexity of Data Reviewed Labs: ordered. Radiology: ordered.    81 year old female with medical history significant for dementia, alert and oriented x1 at baseline, HTN, DM 2, HLD who presents to the emergency department for an episode of confusion and lethargy.  She is a diabetic and was recently started on Ozempic and has had acute  decreased appetite.  She had a 45-minute episode of aphasia where she would not speak according to her husband.  She appeared confused and tired at that time.  When  EMS arrived, her CBG was notably 70 and following oral intake was improved to 158.  The patient had improvement and has since returned to her baseline neurologic status.  No facial droop, no numbness or weakness in the extremities noted by the patient's husband.  He states that she has back to her baseline.  On arrival, the patient was vitally stable.  Presenting her baseline neurologic status, no focal neurologic deficits noted.  Transient episode of aphasia and described by the patient's husband with some confusion and lethargy noted.  CBG without oral intake was notably 70 on EMS arrival making hypoglycemia less likely.  Concern for TIA given the patient's subsequent return to her baseline.  Initial work-up initiated to include CT head, chest x-ray, screening laboratory work-up.  CBG on arrival was 112.  CT head was without acute intracranial abnormality with atrophy and chronic small vessel ischemic changes noted.  A chest x-ray was performed and negative for focal findings.  Plan at time of signout to potentially engage neurology due to concern for potential TIA, consider MRI imaging, follow-up laboratory evaluation.  Signout given to Dr. Doren Custard at 1530  Final Clinical Impression(s) / ED Diagnoses Final diagnoses:  Transient alteration of awareness  Aphasia    Rx / DC Orders ED Discharge Orders     None         Regan Lemming, MD 08/11/21 1524

## 2021-08-11 NOTE — ED Provider Notes (Signed)
Care of patient assumed from Dr. Dennis Bast at 3:30 PM.  This patient had a transient episode of confusion with difficulty speaking, witnessed by husband, and lasting for an estimated 45 minutes.  She subsequently returned to baseline.  She is asymptomatic on arrival in the ED.  She has some dementia at baseline. BG 70 w/EMS.  Patient to undergo TIA work-up.  This has been discussed with neurology who recommends discharge if results are normal. Physical Exam  BP 125/71 (BP Location: Left Wrist)   Pulse 74   Temp 97.6 F (36.4 C) (Oral)   Resp 12   SpO2 96%   Physical Exam Vitals and nursing note reviewed.  Constitutional:      General: She is not in acute distress.    Appearance: Normal appearance. She is well-developed. She is not ill-appearing, toxic-appearing or diaphoretic.  HENT:     Head: Normocephalic and atraumatic.     Right Ear: External ear normal.     Left Ear: External ear normal.     Nose: Nose normal.     Mouth/Throat:     Mouth: Mucous membranes are moist.     Pharynx: Oropharynx is clear.  Eyes:     Extraocular Movements: Extraocular movements intact.     Conjunctiva/sclera: Conjunctivae normal.  Cardiovascular:     Rate and Rhythm: Normal rate and regular rhythm.  Pulmonary:     Effort: Pulmonary effort is normal. No respiratory distress.  Abdominal:     General: There is no distension.     Palpations: Abdomen is soft.  Musculoskeletal:        General: No deformity. Normal range of motion.     Cervical back: Normal range of motion and neck supple.  Skin:    General: Skin is warm and dry.     Capillary Refill: Capillary refill takes less than 2 seconds.     Coloration: Skin is not jaundiced or pale.  Neurological:     General: No focal deficit present.     Mental Status: She is alert. Mental status is at baseline.     Cranial Nerves: No cranial nerve deficit.     Sensory: No sensory deficit.     Motor: No weakness.     Coordination: Coordination normal.   Psychiatric:        Mood and Affect: Mood normal.        Behavior: Behavior normal.     Procedures  Procedures  ED Course / MDM    Medical Decision Making Amount and/or Complexity of Data Reviewed Labs: ordered. Radiology: ordered.   On assessment, patient resting comfortably on ED stretcher.  Husband remains at bedside.  Patient currently has no physical complaints.  CT and MRI brain showed no acute findings.  Lab results were unremarkable.  Patient remained asymptomatic throughout her observation time in the ED.  She is currently followed by [facility] neurology and was advised to schedule follow-up with them next week and return the ED for any return of concerning symptoms.  She was discharged in stable condition.       Godfrey Pick, MD 08/11/21 [phone]

## 2021-08-12 NOTE — Chronic Care Management (AMB) (Signed)
  Chronic Care Management   Note  08/12/2021 Name: Angel Green MRN: [id number] DOB: [birth year]  Angel Green is a 81 y.o. year old female who is a primary care patient of Larose Kells, Alda Berthold, MD. Angel Green is currently enrolled in care management services. An additional referral for Pharm D was placed.   Follow up plan: Telephone appointment with care management team member scheduled for: 08/17/2021  Julian Hy, [place] [facility]  Direct Dial: [phone]

## 2021-08-12 NOTE — Chronic Care Management (AMB) (Signed)
  Chronic Care Management Note  08/12/2021 Name: Angel Green MRN: [id number] DOB: [birth year]  Angel Green is a 81 y.o. year old female who is a primary care patient of Colon Branch, MD and is actively engaged with the care management team. I reached out to Irven Shelling by phone today to assist with re-scheduling a follow up visit with the RN Case Manager  Follow up plan: Telephone appointment with care management team member scheduled for: 08/18/2021  Julian Hy, [place], [place] [facility]  Direct Dial: [phone]

## 2021-08-17 ENCOUNTER — Telehealth: Payer: Self-pay | Admitting: Pharmacist

## 2021-08-17 ENCOUNTER — Ambulatory Visit: Payer: Medicare Other | Admitting: Pharmacist

## 2021-08-17 DIAGNOSIS — E785 Hyperlipidemia, unspecified: Secondary | ICD-10-CM

## 2021-08-17 DIAGNOSIS — I10 Essential (primary) hypertension: Secondary | ICD-10-CM

## 2021-08-17 DIAGNOSIS — E119 Type 2 diabetes mellitus without complications: Secondary | ICD-10-CM

## 2021-08-17 NOTE — Chronic Care Management (AMB) (Signed)
Care Management   Pharmacy Note  08/17/2021 Name: Angel Green MRN: [id number] DOB: 1940-06-29  Subjective: Angel Green is a 81 y.o. year old female who is a primary care patient of Wanda Plump, MD. The Care Management team was consulted for assistance with care management and care coordination needs.    Type 2 DM, insulin dependent: patient husband reports that he did not administer dose of Ozempic 2mg  on Sat 08/15/2021 as planned due to change in patient's appetite since she started Ozempic. He states that she was refusing to eat and he was concerned that blood glucose would get too low if she continued to skip meals. He reports that she did not have as much of a decrease in appetite when she was taking Trulicity 4.5mg  weekly but was changed to Ozempic when [employer] would not get 4.5mg  dose of Trulicity.  Discussed using Freestyle Libre 2 Continuous Glucose Monitor. With recent ED visits for altered mental status which possibly was related to blood glucose in the low normal range I think Continuous Glucose Monitor would help patient, her caregivers and providers to get a better understanding of blood glucose trends. She is taking Humalog Mix 75/25 - 60 units daily. Discussed Continuous Glucose Monitor with [employer] representative. Reports patient can get sensors thru pharmacy benefits but reader will require her to get thru DME. Patient has not seen new endo (switching from Dr Everardo All to Dr Lucianne Muss - appt 09/21/2021) Hypertension: blood pressure has been at goal recently. Patient's husband endorsed that she is receiving blood pressure lowering medications - amlodipine 5mg  daily and losartan 50mg  daily Hyperlipidemia: patient's last LDL was at goal of < 70. She is not currently eating large meals due to Ozempic effects. Husband states they limit intake of fried foods and high fat foods.   Engaged with patient's husband by phone (Patient has dementia)   for initial visit in response to provider  referral for pharmacy case management and/or care coordination services.   The patient was given information about Care Management services today including:  Care Management services includes personalized support from designated clinical staff supervised by the patient's primary care provider, including individualized plan of care and coordination with other care providers. 24/7 contact phone numbers for assistance for urgent and routine care needs. The patient may stop case management services at any time by phone call to the office staff.  Patient agreed to services and consent obtained.  Assessment:  Review of patient status, including review of consultants reports, laboratory and other test data, was performed as part of comprehensive evaluation and provision of chronic care management services.   Recent Office Visits:  07/21/2021 - Int Med (Dr Drue Novel) Seen for urinary incontinence and cutaneous horn. No med changes.  Recent consults:  05/07/2021 - Endo (Dr Haynes Kerns) Follow up type 2 DM, insulin dependent. Increased Humalog Mix 75/25 orm 55 units each morning to 60 units. change back to Trulicity 4.5mg  weekly 05/18/2021 - Endo (Dr Everardo All) Phone Call - changed Trulicity 4.5mg  to Ozempic 2mg  weekly.  07/31/2021 - Plastic Surgeon (Dr Domenica Reamer) Seen for skin lesios on nose and chin. Unable to r/o malignancy. Biopsy planned.    Recent Hospital Visits:  08/11/2021 - ED Visit at [facility]. Seen for transient alternation of awareness and aphasia. BG by EMS was low at 70. worked up for TIA. CT and MRI showed no acute findings. No med changes noted. Recommended f/u neuro.  05/18/2021 - ED Visit at [facility] for altered mental status  SDOH (  Social Determinants of Health) assessments and interventions performed:  none  Objective:  Lab Results  Component Value Date   CREATININE 1.46 (H) 08/11/2021   CREATININE 1.57 (H) 05/18/2021   CREATININE 1.46 (H) 03/31/2021    Lab Results  Component  Value Date   HGBA1C 8.8 (A) 05/07/2021       Component Value Date/Time   CHOL 134 06/17/2020 1047   TRIG 114.0 06/17/2020 1047   HDL 49.50 06/17/2020 1047   CHOLHDL 3 06/17/2020 1047   VLDL 22.8 06/17/2020 1047   LDLCALC 62 06/17/2020 1047    Other: (TSH, CBC, Vit D, etc.)  Clinical ASCVD: Yes  The ASCVD Risk score (Arnett DK, et al., 2019) failed to calculate for the following reasons:   The 2019 ASCVD risk score is only valid for ages 43 to 36    Other: (CHADS2VASc if Afib, PHQ9 if depression, MMRC or CAT for COPD, ACT, DEXA)  BP Readings from Last 3 Encounters:  08/11/21 108/76  07/31/21 134/84  06/24/21 128/74    Care Plan  Allergies  Allergen Reactions   Dust Mite Mixed Allergen Ext [Mite (D. Farinae)]     Medications Reviewed Today     Reviewed by Zannie Kehr, RN (Registered Nurse) on 07/31/21 at [phone]  Med List Status: <None>   Medication Order Taking? Sig Documenting Provider Last Dose Status Informant  amLODipine (NORVASC) 5 MG tablet [id number]  TAKE 1 TABLET DAILY Wanda Plump, MD  Active   Apoaequorin 20 MG CAPS [id number]  Take 20 mg by mouth daily. [provider]  Active   aspirin 81 MG tablet [id number]  Take 81 mg by mouth daily. [provider]  Active Multiple Informants  atorvastatin (LIPITOR) 10 MG tablet [id number]  TAKE 1 TABLET DAILY Wanda Plump, MD  Active   Blood Glucose Monitoring Suppl (FREESTYLE LITE) DEVI [id number]  Use to monitor your blood sugars Romero Belling, MD  Active   Blood Glucose Monitoring Suppl (ONE TOUCH ULTRA 2) w/Device Andria Rhein [id number]  USE AS DIRECTED Romero Belling, MD  Active   Continuous Blood Gluc Sensor (FREESTYLE LIBRE 2 SENSOR) [place] [id number] No 1 Device by Does not apply route every 14 (fourteen) days.  Patient not taking: Reported on 07/16/2021   Romero Belling, MD Not Taking Active   Cyanocobalamin (B-12 PO) [id number]  Take 1 tablet by mouth daily. Takes 5000 mcg by mouth daily. [provider]   Active Multiple Informants  donepezil (ARICEPT) 10 MG tablet [id number]  Take 1 tablet (10 mg total) by mouth at bedtime. Wanda Plump, MD  Active   Dulaglutide (TRULICITY [place]) [id number]  Inject 1 Dose into the skin. Reports 3.5 mg weekly. Last taken Saturday 07/11/21 [provider]  Active Spouse/Significant Other  DULoxetine (CYMBALTA) 20 MG capsule [id number]  TAKE 1 CAPSULE DAILY Wanda Plump, MD  Active   EPINEPHrine 0.3 mg/0.3 mL IJ SOAJ injection [id number]  Inject 0.3 mg into the muscle once. Reported on 06/30/2015  Patient not taking: Reported on 06/24/2021   [provider]  Active            Med Note (CANTER, [facility] D   Tue Sep 21, 2016 10:28 AM) PRN  fluticasone (FLOVENT HFA) 44 MCG/ACT inhaler [id number]  Inhale 2 puffs into the lungs 2 (two) times daily. Waymon Budge, MD  Active Multiple Informants           Med Note Ardelle Park Jul 16, 2021  10:15 AM) Takes as needed  furosemide (LASIX) 20 MG tablet [id number]  TAKE 2 TABLETS DAILY Wanda Plump, MD  Active   glucose blood (FREESTYLE LITE) test strip [id number]  Use to monitor your blood sugars 4 times per day; E11.9 Romero Belling, MD  Active   glucose blood (ONETOUCH ULTRA) test strip [id number] No Use as instructed  Patient not taking: Reported on 07/16/2021   Romero Belling, MD Not Taking Active   Insulin Lispro Prot & Lispro (HUMALOG MIX 75/25 KWIKPEN) (75-25) 100 UNIT/ML Stephanie Coup [id number]  Inject 60 Units into the skin daily with breakfast. And pen needles 1/day Romero Belling, MD  Active   Lancets (FREESTYLE) lancets [id number]  Use to monitor blood sugars 4 times per day, once before each meal and once at bedtime Romero Belling, MD  Active   Lancets [facility] ULTRASOFT) lancets [id number]  Use as instructed Romero Belling, MD  Active   loratadine (CLARITIN) 10 MG tablet [id number]  TAKE 1 TABLET DAILY Wanda Plump, MD  Active   losartan (COZAAR) 50 MG tablet [id number]  TAKE 1 TABLET DAILY Wanda Plump, MD  Active    memantine (NAMENDA) 10 MG tablet [id number]  Take 1 tablet (10 mg total) by mouth 2 (two) times daily. Wanda Plump, MD  Active   Multiple Vitamins-Minerals (CENTRUM SILVER PO) [id number]  Take 1 tablet by mouth daily. [provider]  Active Multiple Informants  mupirocin cream (BACTROBAN) 2 % [id number] No Apply 1 application topically daily.  Patient not taking: Reported on 07/16/2021   Wanda Plump, MD Not Taking Active   omeprazole (PRILOSEC) 20 MG capsule [id number]  Take 1 capsule (20 mg total) by mouth daily. Wanda Plump, MD  Active   Polyethyl Glycol-Propyl Glycol 0.4-0.3 % Criss Rosales [id number]  Apply 1 drop to eye daily as needed (for dry eyes). [provider]  Active   potassium chloride (KLOR-CON) 10 MEQ tablet [id number]  TAKE 1 TABLET DAILY Wanda Plump, MD  Active   QUEtiapine (SEROQUEL) 25 MG tablet [id number]  Take 1 tablet (25 mg total) by mouth at bedtime. Wanda Plump, MD  Active   Semaglutide, 2 MG/DOSE, (OZEMPIC, 2 MG/DOSE,) 8 MG/3ML SOPN [id number] No Inject 2 mg into the skin once a week.  Patient not taking: Reported on 07/16/2021   Reather Littler, MD Not Taking Active   SURE COMFORT PEN NEEDLES 31G X 5 MM MISC [id number]  Use to inject insulin 1 time per day. Romero Belling, MD  Active             Patient Active Problem List   Diagnosis Date Noted   PAD (peripheral artery disease) (HCC) 04/21/2020   Numbness 03/26/2020   Dementia (HCC) 08/14/2017   Cognitive impairment 09/22/2016   Morbid obesity (HCC) 06/30/2015   Diabetes (HCC) 04/23/2015   PCP NOTES >>>>> 12/28/2014   Leg ulcer, left (HCC) 10/12/2013   Annual physical exam 12/14/2011   Angioedema 10/22/2011   Tracheobronchitis, chronic (HCC) 08/20/2010   Fatigue 05/22/2010   CKD (chronic kidney disease) 04/16/2010   GERD 09/30/2008   EDEMA 06/10/2008   Hyperlipidemia 01/25/2007   ANKLE PAIN, CHRONIC 12/08/2006   Seasonal and perennial allergic rhinitis 10/20/2006   HTN (hypertension) 08/30/2006    OSTEOARTHROSIS, GENERALIZED, MULTIPLE SITES 08/30/2006    Conditions to be addressed/monitored: CAD, HTN, HLD, DMII, and CKD Stage 3B   Medication Assistance:  None required.  Patient affirms current coverage meets needs.  Follow Up:  Patient agrees  to Care Plan and Follow-up.  Plan:  Type 2 DM - uncontrolled with variable blood glucose and not tolerating Ozempic; A1c goal listed is < 7.0% but due to dementia and CAD would consider < 7.5% an appropriate A1c goal for patient.  Discussed lowering dose of Ozempic but with only having 2mg  pens on hand patient's husband would have to count clicks of the pen (37 clicks = 1mg ) and he was not comfortable doing this. Called [employer] and they now have Trulicity 4.5mg  dose in stock. Requested they fill - anticipate patient will receive in 3 days.  Will discuss with her PCP to see if he will prescribed Freestyle libre sensor. Will try to use [employer] for DME / sensor approval.  Reviewed home blood glucose readings and reviewed goals  Fasting blood glucose goal (before meals) = 80 to 130 Blood glucose goal after a meal = less than 180  How to Treat a Low Glucose Level (since patient seems to have symptoms of hypoglycemia when blood glucose is around 100 will have her husband monitor and treat as low when blood glucose < 100 for now)   If you have a low blood glucose less than 100, please eat / drink 15 grams of carbohydrates (4 oz of juice, soda, 4 glucose tablets, or 3-4 pieces of hard candy).  It is best so choose a "quick" source of sugar that does no contain fat (chocolate and peanut butter might take longer to increase your blood glucose) Wait 15 minutes and then recheck your blood glucose. If your blood glucose is still less than 70, eat another 15 grams of carbohydrates.  Wait another 15 minutes and recheck your glucose.  Continue this until your blood glucose is over 100. Once you blood glucose is over 70, eat a snack with protein in it to prevent  your blood glucose from dropping again.  Hypertension: Controlled. Blood pressure goal < 140/90 (though < 130/80 would be ideal since also has CKD if can lower blood pressure without causing low blood pressure)  Continue current therapy for blood pressure lowering - amlodipine and losartan  CAD / hyperlipidemia: Controlled. LDL goal < 70 Continue current therapy for lowering cholesterol Telephone follow up appointment with care management team member scheduled for:  2 to 3 weeks  Henrene Pastor, PharmD Clinical Pharmacist [facility]

## 2021-08-18 ENCOUNTER — Ambulatory Visit: Payer: Medicare Other

## 2021-08-18 DIAGNOSIS — E119 Type 2 diabetes mellitus without complications: Secondary | ICD-10-CM

## 2021-08-19 ENCOUNTER — Ambulatory Visit: Payer: Medicare Other | Admitting: Internal Medicine

## 2021-08-20 ENCOUNTER — Telehealth: Payer: Medicare Other

## 2021-08-26 ENCOUNTER — Other Ambulatory Visit: Payer: Self-pay | Admitting: Internal Medicine

## 2021-08-28 ENCOUNTER — Ambulatory Visit (INDEPENDENT_AMBULATORY_CARE_PROVIDER_SITE_OTHER): Payer: Medicare Other | Admitting: Plastic Surgery

## 2021-08-28 ENCOUNTER — Ambulatory Visit: Payer: Medicare Other | Admitting: Plastic Surgery

## 2021-08-28 ENCOUNTER — Ambulatory Visit: Payer: Medicare Other | Admitting: Internal Medicine

## 2021-08-28 VITALS — BP 159/89 | HR 100 | Ht 63.0 in | Wt 225.0 lb

## 2021-08-28 DIAGNOSIS — D489 Neoplasm of uncertain behavior, unspecified: Secondary | ICD-10-CM

## 2021-08-28 DIAGNOSIS — L720 Epidermal cyst: Secondary | ICD-10-CM | POA: Diagnosis not present

## 2021-08-28 DIAGNOSIS — L859 Epidermal thickening, unspecified: Secondary | ICD-10-CM | POA: Diagnosis not present

## 2021-08-28 DIAGNOSIS — D485 Neoplasm of uncertain behavior of skin: Secondary | ICD-10-CM | POA: Diagnosis not present

## 2021-08-28 DIAGNOSIS — L281 Prurigo nodularis: Secondary | ICD-10-CM | POA: Diagnosis not present

## 2021-08-28 NOTE — Progress Notes (Signed)
Operative Note   DATE OF OPERATION: 08/28/2021  LOCATION:    SURGICAL DEPARTMENT: Plastic Surgery  PREOPERATIVE DIAGNOSES:   1.  Right nose lesion, scaling 2.  Left chin lesion, scaling  POSTOPERATIVE DIAGNOSES:  same  PROCEDURE:  Shave excision of right nose lesion, 1 cm Shave excision of left chin lesion, 8 mm  SURGEON: Joshua Soulier P. Mauri Tolen, MD  ANESTHESIA:  Local  COMPLICATIONS: None.   INDICATIONS FOR PROCEDURE:  The patient, Angel Green is a 81 y.o. female born on 30-Nov-1940, is here for treatment of scaling lesions of the right nose and left chin.  The left chin lesion has been present for years and the nose lesion has been present for months. MRN: [id number]  CONSENT:  Informed consent was obtained directly from the patient. Risks, benefits and alternatives were fully discussed. Specific risks including but not limited to bleeding, infection, hematoma, seroma, scarring, pain, infection, wound healing problems, and need for further surgery were all discussed. The patient did have an ample opportunity to have questions answered to satisfaction.   DESCRIPTION OF PROCEDURE:  Local anesthesia was administered. The patient's operative site was prepped and draped in a sterile fashion. A time out was performed and all information was confirmed to be correct.  The nose lesion was shaved excised with a 15 blade to a mid dermal level.  Hemostasis was obtained with hand-held electrocautery.    Attention was then turned toward the left chin.  This was shave excised with a 15 blade also and hand-held electrocautery was used for hemostasis.  Both lesions were sent to pathology for analysis.  The patient tolerated the procedure well.  There were no complications.

## 2021-08-31 ENCOUNTER — Ambulatory Visit: Payer: Medicare Other | Admitting: Plastic Surgery

## 2021-09-07 ENCOUNTER — Ambulatory Visit (INDEPENDENT_AMBULATORY_CARE_PROVIDER_SITE_OTHER): Payer: Medicare Other | Admitting: Internal Medicine

## 2021-09-07 ENCOUNTER — Encounter: Payer: Self-pay | Admitting: Internal Medicine

## 2021-09-07 VITALS — BP 132/76 | HR 88 | Temp 98.5°F | Resp 18 | Ht 64.0 in | Wt 227.1 lb

## 2021-09-07 DIAGNOSIS — F03C18 Unspecified dementia, severe, with other behavioral disturbance: Secondary | ICD-10-CM | POA: Diagnosis not present

## 2021-09-07 DIAGNOSIS — E785 Hyperlipidemia, unspecified: Secondary | ICD-10-CM

## 2021-09-07 DIAGNOSIS — I10 Essential (primary) hypertension: Secondary | ICD-10-CM | POA: Diagnosis not present

## 2021-09-07 NOTE — Progress Notes (Unsigned)
Subjective:    Patient ID: Angel Green, female    DOB: [birth year], 81 y.o.   MRN: [id number]  DOS:  09/07/2021 Type of visit - description: f/u   Went to the ER  08/11/2021, transient confusion, difficulty speaking. Chest x-ray nonacute, CT head no acute, urinalysis negative, MRI no acute with severe brain atrophy, CMP stable with slightly increased creatinine  Since then, she is at baseline. The husband reports bladder incontinence, at night. This is going on for a good while. No fever chills Has no complaint of dysuria or changes in the color of the urine  Review of Systems See above   Past Medical History:  Diagnosis Date   Allergy    Dust Mites   Ankle pain    chronic   Diabetes mellitus, type 2 (HCC)    Hyperlipidemia    Hypertension    Memory loss    Osteoarthritis    RENAL INSUFFICIENCY, CHRONIC 04/16/2010   Rhinitis    allergic nos    Past Surgical History:  Procedure Laterality Date   CATARACT EXTRACTION     CHOLECYSTECTOMY     dental implants     PARTIAL HYSTERECTOMY     in her 30s    Current Outpatient Medications  Medication Instructions   amLODipine (NORVASC) 5 MG tablet TAKE 1 TABLET DAILY   Apoaequorin 20 mg, Oral, Daily   atorvastatin (LIPITOR) 10 MG tablet TAKE 1 TABLET DAILY   Blood Glucose Monitoring Suppl (FREESTYLE LITE) DEVI Use to monitor your blood sugars   Continuous Blood Gluc Sensor (FREESTYLE LIBRE 2 SENSOR) MISC 1 Device, Does not apply, Every 14 days   Cyanocobalamin (B-12 PO) 1 tablet, Oral, Daily, Takes 5000 mcg by mouth daily.   donepezil (ARICEPT) 10 mg, Oral, Daily at bedtime   Dulaglutide 4.5 mg, Subcutaneous, Weekly   DULoxetine (CYMBALTA) 20 MG capsule TAKE 1 CAPSULE DAILY   EPINEPHrine (EPI-PEN) 0.3 mg,  Once   furosemide (LASIX) 20 MG tablet TAKE 2 TABLETS DAILY   glucose blood (FREESTYLE LITE) test strip Use to monitor your blood sugars 4 times per day; E11.9   Insulin Lispro Prot & Lispro (HUMALOG MIX 75/25  KWIKPEN) (75-25) 100 UNIT/ML Kwikpen 60 Units, Subcutaneous, Daily with breakfast, And pen needles 1/day   Lancets (FREESTYLE) lancets Use to monitor blood sugars 4 times per day, once before each meal and once at bedtime   loratadine (CLARITIN) 10 MG tablet TAKE 1 TABLET DAILY   losartan (COZAAR) 50 MG tablet TAKE 1 TABLET DAILY   memantine (NAMENDA) 10 mg, Oral, 2 times daily   Multiple Vitamins-Minerals (CENTRUM SILVER PO) 1 tablet, Oral, Daily,     potassium chloride (KLOR-CON) 10 MEQ tablet TAKE 1 TABLET DAILY   QUEtiapine (SEROQUEL) 25 mg, Oral, Daily at bedtime   SURE COMFORT PEN NEEDLES 31G X 5 MM MISC Use to inject insulin 1 time per day.       Objective:   Physical Exam BP 132/76   Pulse 88   Temp 98.5 F (36.9 C) (Oral)   Resp 18   Ht '5\' 4"'$  (1.626 m)   Wt 227 lb 2 oz (103 kg)   SpO2 92%   BMI 38.99 kg/m  General:   Well developed, NAD, BMI noted. HEENT:  Normocephalic . Face symmetric, atraumatic Lungs:  CTA B Normal respiratory effort, no intercostal retractions, no accessory muscle use. Heart: RRR,  no murmur.  Lower extremities: no pretibial edema bilaterally  Skin: Not pale. Not jaundice Neurologic:  alert & pleasantly demented, follows commands.  Cooperative Speech normal, gait appropriate for age and unassisted Psych--     Behavior appropriate. No anxious or depressed appearing.      Assessment     Assessment   DM  Dr Loanne Drilling + Neuropathy: Per foot exam 12-2014 HTN ---change ACE to ARB is 03/2014 Hyperlipidemia CRI  dx 2012  Sees Dr Arty Baumgartner  Morbid obesity DJD, had  chronic ankle pain Allergies -- dust mites , occ uses a inhaler , sees allergist, has shots q weeks started ~ 08-2014 Dementia:  MMSE 7/218: 24, rx Aricept.  B12, RPR, sed rate and TSH normal MMSE 07/2017 :13 worse MRI brain 6/19:  prominent right temporal lobe volume loss.  PLAN DM: To see Dr. Dwyane Dee soon. HTN: BP is very good, continue amlodipine, Lasix, losartan, potassium.   Last BMP satisfactory Hyperlipidemia: On atorvastatin, check FLP CKD: Last creatinine is stable. Cutaneous horn: Lesion removed, noncancerous but was not completely removed.  They have a follow-up with plastic surgery soon. Dementia: On Aricept, Namenda, they also that Prevagen OTC.  Fortunately she seems to be doing well without major behavioral issues.  She does have bladder incontinence at night, continue using diapers.  Most recent UA done at the ER negative. Aspirin: Okay to stop it. RTC 4 to 6 months 5-3 Urinary incontinence: As described above, explained the patient's husband that this is common in patients with dementia, she is not doing that intentionally (as he believed) No UTI symptoms however for completeness we will check a UA, urine culture (if possible). Recommend the use of diapers and diaper rash creams as needed. Dementia: Went to the ER 05/18/2021, mental status changes, less interactive, did not like to get out of bed. Work-up included a chest x-ray, CT head, CMP, CBC, UA: All nonacute. She is back to baseline Cutaneous horn: See last visit, was referred to dermatology for a skin lesion on the nose, no action was taken, on exam today the area seems to have elevated borders, BCC?Marland Kitchen  Plan: Refer to plastic surgery. RTC as scheduled for June

## 2021-09-07 NOTE — Patient Instructions (Addendum)
Stop aspirin  Other medications the same  Recommend to proceed with covid booster (bivalent) at your pharmacy. Flu shot this fall.   Per our records you are due for your diabetic eye exam. Please contact your eye doctor to schedule an appointment. Please have them send copies of your office visit notes to [place]. Our fax number is [phone]. If you need a referral to an eye doctor please let us know.   Check the  blood pressure regularly BP GOAL is between 110/65 and  135/85. If it is consistently higher or lower, let me know     GO TO THE LAB : Get the blood work     [place], [place] back for a checkup in 4 to 6 months

## 2021-09-08 LAB — LIPID PANEL
Cholesterol: 157 mg/dL (ref 0–200)
HDL: 57.3 mg/dL (ref >39.00)
LDL Cholesterol: 76 mg/dL (ref 0–99)
NonHDL: 100.17
Total CHOL/HDL Ratio: 3
Triglycerides: 123 mg/dL (ref 0.0–149.0)
VLDL: 24.6 mg/dL (ref 0.0–40.0)

## 2021-09-08 NOTE — Assessment & Plan Note (Signed)
DM: To see Dr. Dwyane Dee soon. HTN: BP is very good, continue amlodipine, Lasix, losartan, potassium.  Last BMP satisfactory Hyperlipidemia: On atorvastatin, check FLP CKD: Last creatinine is stable. Cutaneous horn: Lesion removed, noncancerous but was not completely excised, have a follow-up with plastic surgery soon. Dementia: On Aricept, Namenda, also takes Prevagen OTC.  Fortunately she seems to be doing well without major behavioral issues.  She does have bladder incontinence at night, continue using diapers.  Most recent UA done at the ER negative. Aspirin: Okay to stop it. RTC 4 to 6 months

## 2021-09-14 ENCOUNTER — Ambulatory Visit: Payer: Medicare Other | Admitting: Plastic Surgery

## 2021-09-15 ENCOUNTER — Other Ambulatory Visit: Payer: Self-pay | Admitting: Endocrinology

## 2021-09-15 ENCOUNTER — Ambulatory Visit: Payer: Medicare Other

## 2021-09-15 DIAGNOSIS — E119 Type 2 diabetes mellitus without complications: Secondary | ICD-10-CM

## 2021-09-15 NOTE — Chronic Care Management (AMB) (Signed)
 Care Management    RN Visit Note  09/15/2021 Name: Angel Green MRN: [id number] DOB: 06/19/1940  Subjective: Angel Green is a 81 y.o. year old female who is a primary care patient of [name], [name], MD. The care management team was consulted for assistance with disease management and care coordination needs.    Engaged with patient by telephone for follow up visit in response to provider referral for case management and/or care coordination services.   Consent to Services:   Angel Green was given information about Care Management services today including:  Care Management services includes personalized support from designated clinical staff supervised by her physician, including individualized plan of care and coordination with other care providers 24/7 contact phone numbers for assistance for urgent and routine care needs. The patient may stop case management services at any time by phone call to the office staff.  Patient agreed to services and consent obtained.   Assessment: Review of patient past medical history, allergies, medications, health status, including review of consultants reports, laboratory and other test data, was performed as part of comprehensive evaluation and provision of chronic care management services.   SDOH (Social Determinants of Health) assessments and interventions performed:    Care Plan  Allergies  Allergen Reactions   Dust Mite Mixed Allergen Ext [Mite (D. Farinae)]     Outpatient Encounter Medications as of 09/15/2021  Medication Sig Note   amLODipine (NORVASC) 5 MG tablet TAKE 1 TABLET DAILY    Apoaequorin 20 MG CAPS Take 20 mg by mouth daily. 08/17/2021: Prevagen (over-the-counter)    atorvastatin (LIPITOR) 10 MG tablet TAKE 1 TABLET DAILY    Blood Glucose Monitoring Suppl (FREESTYLE LITE) DEVI Use to monitor your blood sugars    Continuous Blood Gluc Sensor (FREESTYLE LIBRE 2 SENSOR) MISC 1 Device by Does not apply route every 14 (fourteen)  days.    Cyanocobalamin (B-12 PO) Take 1 tablet by mouth daily. Takes 5000 mcg by mouth daily.    donepezil (ARICEPT) 10 MG tablet Take 1 tablet (10 mg total) by mouth at bedtime.    Dulaglutide 4.5 MG/0.5ML SOPN Inject 4.5 mg into the skin once a week.    DULoxetine (CYMBALTA) 20 MG capsule TAKE 1 CAPSULE DAILY    EPINEPHrine 0.3 mg/0.3 mL IJ SOAJ injection Inject 0.3 mg into the muscle once. Reported on 06/30/2015 (Patient not taking: Reported on 09/07/2021) 09/21/2016: PRN   furosemide (LASIX) 20 MG tablet TAKE 2 TABLETS DAILY (Patient taking differently: Take 20 mg by mouth 2 (two) times daily.)    glucose blood (FREESTYLE LITE) test strip Use to monitor your blood sugars 4 times per day; E11.9    Insulin Lispro Prot & Lispro (HUMALOG MIX 75/25 KWIKPEN) (75-25) 100 UNIT/ML Kwikpen Inject 60 Units into the skin daily with breakfast. And pen needles 1/day    Lancets (FREESTYLE) lancets Use to monitor blood sugars 4 times per day, once before each meal and once at bedtime    loratadine (CLARITIN) 10 MG tablet TAKE 1 TABLET DAILY    losartan (COZAAR) 50 MG tablet TAKE 1 TABLET DAILY    memantine (NAMENDA) 10 MG tablet Take 1 tablet (10 mg total) by mouth 2 (two) times daily.    Multiple Vitamins-Minerals (CENTRUM SILVER PO) Take 1 tablet by mouth daily.    potassium chloride (KLOR-CON) 10 MEQ tablet TAKE 1 TABLET DAILY    QUEtiapine (SEROQUEL) 25 MG tablet Take 1 tablet (25 mg total) by mouth at bedtime.      SURE COMFORT PEN NEEDLES 31G X 5 MM MISC Use to inject insulin 1 time per day.    No facility-administered encounter medications on file as of 09/15/2021.    Patient Active Problem List   Diagnosis Date Noted   PAD (peripheral artery disease) ([place]) 04/21/2020   Numbness 03/26/2020   Dementia ([place]) 08/14/2017   Cognitive impairment 09/22/2016   Morbid obesity ([place]) 06/30/2015   Diabetes ([place]) 04/23/2015   PCP NOTES >>>>> 12/28/2014   Leg ulcer, left ([place]) 10/12/2013   Annual physical exam  12/14/2011   Angioedema 10/22/2011   Tracheobronchitis, chronic ([place]) 08/20/2010   Fatigue 05/22/2010   CKD (chronic kidney disease) 04/16/2010   GERD 09/30/2008   EDEMA 06/10/2008   Hyperlipidemia 01/25/2007   ANKLE PAIN, CHRONIC 12/08/2006   Seasonal and perennial allergic rhinitis 10/20/2006   HTN (hypertension) 08/30/2006   OSTEOARTHROSIS, GENERALIZED, MULTIPLE SITES 08/30/2006    Conditions to be addressed/monitored: DMII  Care Plan : RN Care Management Plan of Care  Updates made by Luretha Rued, RN since 09/15/2021 12:00 AM  Completed 09/15/2021   Problem: No Plan of Care Established for Managment of Chronic Disease and/or Care Coordination Needs Resolved 09/15/2021  Priority: High     Long-Range Goal: Developement of Plan of Care for Chronic Disease Managment and/or Care Coordination needs Completed 09/15/2021  Start Date: 07/16/2021  Expected End Date: 01/16/2022  Priority: High  Note:   Goals met: Case closed Current Barriers: RNCM spoke with Mr. Giese, (primary care giver/DPR). He reports Angel Green is "doing pretty good". Reports blood sugar this morning 119 and states she has not gone below 90. Has an upcoming appointment with Dr. Dwyane Dee, endocrinologist, on 09/21/21. Reports his niece is living with them to assist in taking care of Mrs. Golay.  He denies any concerns at this time. RNCM discussed case closure. Mr. Boline in agreement. RNCM encouraged Mr. Johndrow to contact primary care if care coordination needs in the future.  Knowledge Deficits related to plan of care for management of DMII  Chronic Disease Management support and education needs related to DMII Cognitive Deficits  RNCM Clinical Goal(s):  Patient will verbalize understanding of plan for management of DMII as evidenced by patient/caregiver report attend all scheduled medical appointments:   as evidenced by self report and/or chart notation        demonstrate improved adherence to prescribed  treatment plan for DMII as evidenced by improved CBG, decreased A1C, taking medications as prescribed, attending provider visits as scheduled work with pharmacist to address medications related to DMII as evidenced by review of EMR and patient or pharmacist report    through collaboration with RN Care manager, provider, and care team.   Interventions: 1:1 collaboration with primary care provider regarding plan of care Inter-disciplinary care team collaboration (see longitudinal plan of care) Evaluation of current treatment plan related to  self management and patient's adherence to plan as established by provider  Diabetes Interventions:  (Status:  Goal Met.) Long Term Goal Checking and recording blood sugars as recommended, attending provider visits as scheduled, contact provider as needed As recommended by clinical pharmacist A1c goal:  7.5  Patient with Dementia and CAD Reviewed scheduled/upcoming provider appointments including: start of care with Dr. Dwyane Dee, Endocrinologist 09/21/21 Lab Results  Component Value Date   HGBA1C 8.8 (A) 05/07/2021    Patient Goals/Self-Care Activities: Take medications as prescribed   Attend all scheduled provider appointments Call pharmacy for medication refills 3-7 days in advance of running out  of medications Call provider office for new concerns or questions  Check and record blood sugar as recommended by provider Continue to monitor Blood sugar and treat for low blood sugar as recommended by clinical pharmacist   Plan: No further follow up required: Mr. Ng encouraged to contact primary care provider if care management/care coordination needs in the future.    , RN, MSN, BSN, CCM Care Management Coordinator [phone]  

## 2021-09-15 NOTE — Patient Instructions (Signed)
Visit Information  Thank you for allowing me to share the care management and care coordination services that are available to you as part of your health plan and services through your primary care provider and medical home. Please reach out to your primary care provider or me at [phone] if the care management/care coordination team may be of assistance to you in the future.   Alvin Rubano, RN, MSN, BSN, CCM Care Management Coordinator [phone]    

## 2021-09-17 ENCOUNTER — Ambulatory Visit (INDEPENDENT_AMBULATORY_CARE_PROVIDER_SITE_OTHER): Payer: Medicare Other | Admitting: Plastic Surgery

## 2021-09-17 ENCOUNTER — Telehealth: Payer: Self-pay | Admitting: Pharmacist

## 2021-09-17 DIAGNOSIS — D489 Neoplasm of uncertain behavior, unspecified: Secondary | ICD-10-CM

## 2021-09-17 DIAGNOSIS — D485 Neoplasm of uncertain behavior of skin: Secondary | ICD-10-CM

## 2021-09-17 NOTE — Telephone Encounter (Signed)
Received the following message on [employer] from [facility]: Good morning. We need to speak with the patient for consent. Please have the patient call [phone]. Thank you  We are trying to get Continuous Glucose Monitor / Freestyle Libre from [facility] for Angel Green.  Called and spoke to Angel Green and provided number above.

## 2021-09-17 NOTE — Progress Notes (Signed)
Patient is status post biopsy of chin 2 weeks ago.  She does note some depigmentation of the nose.  Physical exam GEN biopsy site hearing healing very well, nose site is healing well but does have some lack of pigment  1. Skin , right nose BENIGN EPIDERMAL HYPERPLASIA WITH HYPERKERATOSIS, LATERAL AND DEEP MARGINS INVOLVED 2. Skin , left chin PRURIGO NODULARIS OVERLYING EPIDERMOID CYST, SCARRED, DEEP MARGIN INVOLVED  Assessment and plan I do not think that removing these lesions and the entirety is the best option for this patient given the benign pathology but I will refer her to dermatology to see if they recommend any other treatments.

## 2021-09-18 ENCOUNTER — Other Ambulatory Visit (INDEPENDENT_AMBULATORY_CARE_PROVIDER_SITE_OTHER): Payer: Medicare Other

## 2021-09-18 DIAGNOSIS — E119 Type 2 diabetes mellitus without complications: Secondary | ICD-10-CM

## 2021-09-18 LAB — BASIC METABOLIC PANEL
BUN: 9 mg/dL (ref 6–23)
CO2: 27 mEq/L (ref 19–32)
Calcium: 9.5 mg/dL (ref 8.4–10.5)
Chloride: 100 mEq/L (ref 96–112)
Creatinine, Ser: 1.57 mg/dL — ABNORMAL HIGH (ref 0.40–1.20)
GFR: 30.32 mL/min — ABNORMAL LOW (ref >60.00)
Glucose, Bld: 222 mg/dL — ABNORMAL HIGH (ref 70–99)
Potassium: 4 mEq/L (ref 3.5–5.1)
Sodium: 136 mEq/L (ref 135–145)

## 2021-09-18 LAB — HEMOGLOBIN A1C: Hgb A1c MFr Bld: 7.5 % — ABNORMAL HIGH (ref 4.6–6.5)

## 2021-09-20 ENCOUNTER — Encounter: Payer: Self-pay | Admitting: Endocrinology

## 2021-09-20 NOTE — Progress Notes (Unsigned)
Patient ID: Angel Green, female   DOB: 01/06/1941, 81 y.o.   MRN: [id number]           Reason for Appointment: Type II Diabetes follow-up   History of Present Illness   Diagnosis date: 1999  Previous history:  Non-insulin hypoglycemic drugs previously used: Trulicity was started in April 2019 and she has been on 4.5 mg since 2021 Insulin was started in 2010  A1c range in the last few years is: 6.1-9.1  Recent history:     Non-insulin hypoglycemic drugs: Trulicity 4.5 mg weekly     Insulin regimen: Humalog mix 60 units with breakfast            Side effects from medications: None  Current self management, blood sugar patterns and problems identified:  A1c is 7.5 compared to 8.8 Although she has not had any changes in her weight, diet or insulin she has had a better A1c recently She has been on the same dose of Trulicity for quite some time She is mostly being taken care of by her niece and also her husband Her blood sugars are being checked only once a day in the mornings Generally blood sugars are fairly good and frequently below 100 Her family thinks that once when her sugar was 78 she got confused but during her admission it was not felt that she had a hypoglycemic episode Recently lowest blood sugar is about 86 which was this morning without symptoms Glucose in the office in the early afternoon is 133 She does not apparently have any weak spells or confusion during the day or unusual symptoms during sleep She does tend to have some frequent sweating but not associated with low sugars  Exercise: none  Diet management: Eating 3 meals a day, no specific meal plan     Hypoglycemia: As above  Glucometer: One Touch.           Blood Glucose readings from recall:  PRE-MEAL Fasting Lunch Dinner Bedtime Overall  Glucose range: 78-130      Mean/median:          Weight control:  Wt Readings from Last 3 Encounters:  09/21/21 224 lb 12.8 oz (102 kg)  09/07/21 227  lb 2 oz (103 kg)  08/28/21 225 lb (102.1 kg)            Diabetes labs:  Lab Results  Component Value Date   HGBA1C 7.5 (H) 09/18/2021   HGBA1C 8.8 (A) 05/07/2021   HGBA1C 9.1 (A) 03/04/2021   Lab Results  Component Value Date   MICROALBUR 0.7 07/13/2013   LDLCALC 76 09/07/2021   CREATININE 1.57 (H) 09/18/2021    Lab Results  Component Value Date   FRUCTOSAMINE 299 (H) 06/26/2020   FRUCTOSAMINE 370 (H) 04/22/2015     Allergies as of 09/21/2021       Reactions   Dust Mite Mixed Allergen Ext [mite (d. Farinae)]         Medication List        Accurate as of September 21, 2021  3:30 PM. If you have any questions, ask your nurse or doctor.          amLODipine 5 MG tablet Commonly known as: NORVASC TAKE 1 TABLET DAILY   Apoaequorin 20 MG Caps Take 20 mg by mouth daily.   atorvastatin 10 MG tablet Commonly known as: LIPITOR TAKE 1 TABLET DAILY   B-12 PO Take 1 tablet by mouth daily. Takes 5000 mcg by mouth daily.  CENTRUM SILVER PO Take 1 tablet by mouth daily.   donepezil 10 MG tablet Commonly known as: ARICEPT Take 1 tablet (10 mg total) by mouth at bedtime.   Dulaglutide 4.5 MG/0.5ML Sopn Inject 4.5 mg into the skin once a week. What changed: Another medication with the same name was removed. Continue taking this medication, and follow the directions you see here. Changed by: Elayne Snare, MD   DULoxetine 20 MG capsule Commonly known as: CYMBALTA TAKE 1 CAPSULE DAILY   EPINEPHrine 0.3 mg/0.3 mL Soaj injection Commonly known as: EPI-PEN Inject 0.3 mg into the muscle once. Reported on 06/30/2015   freestyle lancets Use to monitor blood sugars 4 times per day, once before each meal and once at bedtime   FreeStyle Libre 2 Sensor Misc 1 Device by Does not apply route every 14 (fourteen) days.   FreeStyle Lite Devi Use to monitor your blood sugars   FREESTYLE LITE test strip Generic drug: glucose blood Use to monitor your blood sugars 4 times per  day; E11.9   furosemide 20 MG tablet Commonly known as: LASIX TAKE 2 TABLETS DAILY What changed:  how much to take when to take this   Insulin Lispro Prot & Lispro (75-25) 100 UNIT/ML Kwikpen Commonly known as: HumaLOG Mix 75/25 KwikPen Inject 60 Units into the skin daily with breakfast. And pen needles 1/day   loratadine 10 MG tablet Commonly known as: CLARITIN TAKE 1 TABLET DAILY   losartan 50 MG tablet Commonly known as: COZAAR TAKE 1 TABLET DAILY   memantine 10 MG tablet Commonly known as: NAMENDA Take 1 tablet (10 mg total) by mouth 2 (two) times daily.   potassium chloride 10 MEQ tablet Commonly known as: KLOR-CON TAKE 1 TABLET DAILY   QUEtiapine 25 MG tablet Commonly known as: SEROquel Take 1 tablet (25 mg total) by mouth at bedtime.   Sure Comfort Pen Needles 31G X 5 MM Misc Generic drug: Insulin Pen Needle Use to inject insulin 1 time per day.        Allergies:  Allergies  Allergen Reactions   Dust Mite Mixed Allergen Ext [Mite (D. Farinae)]     Past Medical History:  Diagnosis Date   Allergy    Dust Mites   Ankle pain    chronic   Diabetes mellitus, type 2 ([place])    Hyperlipidemia    Hypertension    Memory loss    Osteoarthritis    RENAL INSUFFICIENCY, CHRONIC 04/16/2010   Rhinitis    allergic nos    Past Surgical History:  Procedure Laterality Date   CATARACT EXTRACTION     CHOLECYSTECTOMY     dental implants     PARTIAL HYSTERECTOMY     in her 23s    Family History  Problem Relation Age of Onset   Healthy Brother    Healthy Son    Healthy Son    Other Mother        unsure of history - "old age"   Other Father        unsure of history - "old age"   Heart attack Neg Hx    Colon cancer Neg Hx    Breast cancer Neg Hx    Stroke Neg Hx     Social History:  reports that she has never smoked. She has never used smokeless tobacco. She reports that she does not drink alcohol and does not use drugs.  Review of Systems:  Last  diabetic eye exam date 10/21, reportedly  no retinopathy  Last foot exam date: 9/22  Hypertension:   Treatment includes losartan 50 mg daily  BP Readings from Last 3 Encounters:  09/21/21 124/82  09/07/21 132/76  08/28/21 (!) 159/89   She has had CKD followed by nephrologist about once a year, not on any Kerendia or SGLT2 drugs No recent microalbumin levels available  Lab Results  Component Value Date   CREATININE 1.57 (H) 09/18/2021   CREATININE 1.46 (H) 08/11/2021   CREATININE 1.57 (H) 05/18/2021    Lipid management: Treated with atorvastatin 10 mg daily prescribed by her PCP    Lab Results  Component Value Date   CHOL 157 09/07/2021   CHOL 134 06/17/2020   CHOL 124 03/24/2018   Lab Results  Component Value Date   HDL 57.30 09/07/2021   HDL 49.50 06/17/2020   HDL 41.10 03/24/2018   Lab Results  Component Value Date   LDLCALC 76 09/07/2021   LDLCALC 62 06/17/2020   LDLCALC 64 03/24/2018   Lab Results  Component Value Date   TRIG 123.0 09/07/2021   TRIG 114.0 06/17/2020   TRIG 96.0 03/24/2018   Lab Results  Component Value Date   CHOLHDL 3 09/07/2021   CHOLHDL 3 06/17/2020   CHOLHDL 3 03/24/2018   No results found for: "LDLDIRECT"     Examination:   BP 124/82   Pulse 95   Ht '5\' 4"'$  (1.626 m)   Wt 224 lb 12.8 oz (102 kg)   SpO2 98%   BMI 38.59 kg/m   Body mass index is 38.59 kg/m.    ASSESSMENT/ PLAN:    Diabetes type 2 with severe obesity, hypertension and hyperlipidemia:   Current regimen: Premixed insulin once a day in the morning and Trulicity 4.5 mg weekly  See history of present illness for detailed discussion of current diabetes management, blood sugar patterns and problems identified  A1c is 7.5  Blood glucose control fair with A1c improved and has been as high as 9.1 earlier this year Not clear how her A1c is improved without any recent change in medications, diet or weight Unable to assess her readings after meals and only  fasting readings being taken at home She does have a freestyle libre sensor approved but has not received it yet Fasting and may be low normal at times  Recommendations for diabetes:  Exercise as much as tolerated with exercise bike or treadmill preferably 15 to 20 minutes daily or as tolerated Start rotating timings of blood sugar monitoring and alternate fasting and 2 hours after meals especially after dinner To protect against potential hypoglycemia overnight will reduce her insulin to 56 units in the morning Start using freestyle libre sensor when available and she will be trained by our diabetes educator Also she will need more complete diabetes education including meal planning review May also benefit from [place] as discussed below Will try to coordinate this with her PCP and nephrologist Continue Trulicity unchanged  CKD of unclear etiology followed by nephrologist No recent microalbumin level available and will be checked today if she is able to give [place] urine specimen However she should be able to benefit from an SGLT2 drug with some adjustment in her losartan dose, will message her PCP and nephrologist regarding this  Hypercholesterolemia: Well-controlled with LDL recently 76  Patient Instructions  Check blood sugars on waking up 5 days a week  Also check blood sugars about 2 hours after meals and do this after different meals by rotation  Recommended blood  sugar levels on waking up are 90-130 and about 2 hours after meal is 130-180  Please bring your blood sugar monitor to each visit, thank you  Take 56 units daily    Bostyn Kunkler 09/21/2021, 3:30 PM

## 2021-09-21 ENCOUNTER — Telehealth: Payer: Self-pay | Admitting: Internal Medicine

## 2021-09-21 ENCOUNTER — Encounter: Payer: Self-pay | Admitting: Endocrinology

## 2021-09-21 ENCOUNTER — Ambulatory Visit (INDEPENDENT_AMBULATORY_CARE_PROVIDER_SITE_OTHER): Payer: Medicare Other | Admitting: Endocrinology

## 2021-09-21 VITALS — BP 124/82 | HR 95 | Ht 64.0 in | Wt 224.8 lb

## 2021-09-21 DIAGNOSIS — E1122 Type 2 diabetes mellitus with diabetic chronic kidney disease: Secondary | ICD-10-CM

## 2021-09-21 DIAGNOSIS — N189 Chronic kidney disease, unspecified: Secondary | ICD-10-CM | POA: Diagnosis not present

## 2021-09-21 DIAGNOSIS — N1831 Chronic kidney disease, stage 3a: Secondary | ICD-10-CM

## 2021-09-21 DIAGNOSIS — Z794 Long term (current) use of insulin: Secondary | ICD-10-CM

## 2021-09-21 DIAGNOSIS — E119 Type 2 diabetes mellitus without complications: Secondary | ICD-10-CM

## 2021-09-21 DIAGNOSIS — E1165 Type 2 diabetes mellitus with hyperglycemia: Secondary | ICD-10-CM

## 2021-09-21 LAB — POCT GLUCOSE (DEVICE FOR HOME USE): POC Glucose: 133 mg/dl — AB (ref 70–99)

## 2021-09-21 NOTE — Patient Instructions (Addendum)
Check blood sugars on waking up 5 days a week  Also check blood sugars about 2 hours after meals and do this after different meals by rotation  Recommended blood sugar levels on waking up are 90-130 and about 2 hours after meal is 130-180  Please bring your blood sugar monitor to each visit, thank you  Take 56 units daily  Exercise daily

## 2021-09-21 NOTE — Telephone Encounter (Signed)
Left message for patient to call back and schedule Medicare Annual Wellness Visit (AWV).   Please offer to do virtually or by telephone.  Left office number and my jabber [phone].  Last AWV:11/14/2018  Please schedule at anytime with Nurse Health Advisor.

## 2021-09-22 LAB — MICROALBUMIN / CREATININE URINE RATIO
Creatinine,U: 88.7 mg/dL
Microalb Creat Ratio: 0.8 mg/g (ref 0.0–30.0)
Microalb, Ur: <0.7 mg/dL (ref 0.0–1.9)

## 2021-09-25 ENCOUNTER — Ambulatory Visit: Payer: Medicare Other | Admitting: Plastic Surgery

## 2021-09-30 ENCOUNTER — Telehealth: Payer: Self-pay

## 2021-09-30 NOTE — Telephone Encounter (Signed)
Faxed referral request to [facility] with confirmation of receipt. Will send for scan.

## 2021-10-15 ENCOUNTER — Telehealth: Payer: Self-pay | Admitting: Pharmacist

## 2021-10-15 NOTE — Telephone Encounter (Signed)
Patient has been approved to received Freestyle Libre Continuous Glucose Monitor sensor and monitor as of 10/13/2021. Should received delivery in the next week form [facility].  LM on VM of patient's husband to contact endo office for education on Continuous Glucose Monitor.

## 2021-10-19 ENCOUNTER — Telehealth: Payer: Self-pay

## 2021-10-19 ENCOUNTER — Telehealth: Payer: Self-pay | Admitting: Dietician

## 2021-10-19 NOTE — Telephone Encounter (Signed)
Called patient for Training on the Libe 2. Patient was not available. Left message for her to call us to make the appointment at [facility].  Will need to be sure that patient has a compatible phone or [place] reader prior to scheduling.  Antonieta Iba, RD, LDN, CDCES

## 2021-10-19 NOTE — Telephone Encounter (Signed)
Patient received Angel Green and needs appt to set up learn how to use CGM.

## 2021-10-20 ENCOUNTER — Encounter: Payer: Medicare Other | Attending: Endocrinology | Admitting: Dietician

## 2021-10-20 DIAGNOSIS — E119 Type 2 diabetes mellitus without complications: Secondary | ICD-10-CM | POA: Insufficient documentation

## 2021-10-20 NOTE — Progress Notes (Signed)
Freestyle Libre Personal CGM Training  Start [phone]    End time: 4854 Total time: 63 Auri Feltz's husband and niece were educated about the following: Patient has dementia.  -Getting to know device    (patient's phone programmed ) -Setting up device , trend arrows - sensor interaction with vitamin C: do not take more than 500 mg vitamin C daily -Setting alert profile (high alert  240 , low alert 70)  setting up reminders (reminders for 6x/day testing  ) -Inserting sensor (patient applied the sensor on his left upper back of arm  himself today with minimal assist. It appeared to be working and in warm up when he left the office) -Calibrating- none required. - using meter when test blood sugar symbol appears -Ending sensor session -Trouble shooting -Reviewed insulin dosing from [employer] connected to the [employer] shared with her husband and niece.  The Link up app was installed on their phone.  Patient's husband to call for any questions.  Antonieta Iba, RD, LDN, CDCES

## 2021-10-21 ENCOUNTER — Ambulatory Visit (INDEPENDENT_AMBULATORY_CARE_PROVIDER_SITE_OTHER): Payer: Medicare Other | Admitting: Neurology

## 2021-10-21 ENCOUNTER — Institutional Professional Consult (permissible substitution): Payer: Medicare Other | Admitting: Neurology

## 2021-10-21 ENCOUNTER — Encounter: Payer: Self-pay | Admitting: Neurology

## 2021-10-21 VITALS — BP 137/83 | HR 102 | Ht 64.0 in | Wt 227.0 lb

## 2021-10-21 DIAGNOSIS — F02C0 Dementia in other diseases classified elsewhere, severe, without behavioral disturbance, psychotic disturbance, mood disturbance, and anxiety: Secondary | ICD-10-CM

## 2021-10-21 DIAGNOSIS — G301 Alzheimer's disease with late onset: Secondary | ICD-10-CM | POA: Diagnosis not present

## 2021-10-21 MED ORDER — MEMANTINE HCL 10 MG PO TABS
10.0000 mg | ORAL_TABLET | Freq: Two times a day (BID) | ORAL | 3 refills | Status: DC
Start: 2021-10-21 — End: 2022-10-19

## 2021-10-21 MED ORDER — DONEPEZIL HCL 10 MG PO TABS: 10.0000 mg | ORAL_TABLET | Freq: Every day | ORAL | 3 refills | Status: DC

## 2021-10-21 NOTE — Progress Notes (Addendum)
Chief Complaint  Patient presents with   New Patient (Initial Visit)    Rm 15. Accompanied by husband, Angel Green and niece, Angel Green. NX Angel Green/McCue, ED referral for transient alteration of awareness.      ASSESSMENT AND PLAN  Angel Green is a 81 y.o. female   Dementia,  Advanced, MRI of the brain in June 2023 showed significant brain atrophy,  Laboratory evaluation showed no treatable etiology  Family history of dementia  Most consistent with central nervous system degenerative disorder, Alzheimer's dementia,  She has been treated with Aricept, Namenda,  Encourage moderate exercise  Discussed with her husband, dementia likely continue to progress slowly, he wants patients to be seen once a year,  Next follow-up with nurse practitioner in 1 year  Went over her medication list, suggested Claritin as needed, stop Cymbalta, she has no agitations, denied pain  DIAGNOSTIC DATA (LABS, IMAGING, TESTING) - I reviewed patient records, labs, notes, testing and imaging myself where available.   MEDICAL HISTORY:  Angel Green is a 81 year old female, accompanied by her husband and niece Angel Green, seen in request by his primary care physician Angel Green, for evaluation of confusion  I reviewed and summarized the referring note. PMHX. HTN HLD DM Dementia  Patient was noted to have gradual onset of memory loss since 2018, has been recall she got lost driving on the familiar route at that time, memory loss gradually getting worse, she becomes sedentary, staying at home most of the time, there was no significant agitation, she can still dress herself, go to the bathroom, no incontinence, but needing help bathing  She retired from [employer], used to work as a Estate agent, later Engineer, maintenance  Her mother suffered memory loss   She was sent to emergency room on August 11, 2021 for increased confusion, difficulty speaking, I personally reviewed MRI of the brain, moderate generalized atrophy, no  significant abnormality  Laboratory evaluation showed normal negative B12, TSH, A1c 7.5, BMP showed elevated creatinine 1.57, normal lipid panel, LDL 76, CBC  She was found to have significant dementia, could not complete sentences, rely on her family to provide history, does not know her age,  PHYSICAL EXAM:   Vitals:   10/21/21 0929  BP: 137/83  Pulse: (!) 102  Weight: 227 lb (103 kg)  Height: '5\' 4"'$  (1.626 m)   Not recorded     Body mass index is 38.96 kg/m.  PHYSICAL EXAMNIATION:  Gen: NAD, conversant, well nourised, well groomed                     Cardiovascular: Regular rate rhythm, no peripheral edema, warm, nontender. Eyes: Conjunctivae clear without exudates or hemorrhage Neck: Supple, no carotid bruits. Pulmonary: Clear to auscultation bilaterally   NEUROLOGICAL EXAM:  MENTAL STATUS: Speech/cognition: Awake, pleasant, following command, does not know her age, controlled Attempted to [facility], was not corrected in all of the items tested  CRANIAL NERVES: CN II: Visual fields are full to confrontation. Pupils are round equal and briskly reactive to light. CN III, IV, VI: extraocular movement are normal. No ptosis. CN V: Facial sensation is intact to light touch CN VII: Face is symmetric with normal eye closure  CN VIII: Hearing is normal to causal conversation. CN IX, X: Phonation is normal. CN XI: Head turning and shoulder shrug are intact  MOTOR: Moving 4 extremities without difficulty REFLEXES: Reflexes are 1 proximal, absent at ankles  SENSORY: Length-dependent decreased light touch to mid shin level  COORDINATION: There  is no trunk or limb dysmetria noted.  GAIT/STANCE: Need push-up to get up from seated position, mildly antalgic  REVIEW OF SYSTEMS:  Full 14 system review of systems performed and notable only for as above All other review of systems were negative.   ALLERGIES: Allergies  Allergen Reactions   Dust Mite Mixed Allergen Ext  [Mite (D. Farinae)]     HOME MEDICATIONS: Current Outpatient Medications  Medication Sig Dispense Refill   amLODipine (NORVASC) 5 MG tablet TAKE 1 TABLET DAILY 90 tablet 3   Apoaequorin 20 MG CAPS Take 20 mg by mouth daily.     atorvastatin (LIPITOR) 10 MG tablet TAKE 1 TABLET DAILY 90 tablet 1   Blood Glucose Monitoring Suppl (FREESTYLE LITE) DEVI Use to monitor your blood sugars 1 each 0   Continuous Blood Gluc Sensor (FREESTYLE LIBRE 2 SENSOR) MISC 1 Device by Does not apply route every 14 (fourteen) days. 6 each 3   Cyanocobalamin (B-12 PO) Take 1 tablet by mouth daily. Takes 5000 mcg by mouth daily.     donepezil (ARICEPT) 10 MG tablet Take 1 tablet (10 mg total) by mouth at bedtime. 90 tablet 1   Dulaglutide 4.5 MG/0.5ML SOPN Inject 4.5 mg into the skin once a week.     DULoxetine (CYMBALTA) 20 MG capsule TAKE 1 CAPSULE DAILY 90 capsule 1   EPINEPHrine 0.3 mg/0.3 mL IJ SOAJ injection Inject 0.3 mg into the muscle once. Reported on 06/30/2015     furosemide (LASIX) 20 MG tablet TAKE 2 TABLETS DAILY (Patient taking differently: Take 20 mg by mouth 2 (two) times daily.) 180 tablet 1   glucose blood (FREESTYLE LITE) test strip Use to monitor your blood sugars 4 times per day; E11.9 400 each 12   Insulin Lispro Prot & Lispro (HUMALOG MIX 75/25 KWIKPEN) (75-25) 100 UNIT/ML Kwikpen Inject 60 Units into the skin daily with breakfast. And pen needles 1/day 75 mL 3   Lancets (FREESTYLE) lancets Use to monitor blood sugars 4 times per day, once before each meal and once at bedtime 400 each 12   loratadine (CLARITIN) 10 MG tablet TAKE 1 TABLET DAILY 90 tablet 3   losartan (COZAAR) 50 MG tablet TAKE 1 TABLET DAILY 90 tablet 1   memantine (NAMENDA) 10 MG tablet Take 1 tablet (10 mg total) by mouth 2 (two) times daily. 180 tablet 1   Multiple Vitamins-Minerals (CENTRUM SILVER PO) Take 1 tablet by mouth daily.     potassium chloride (KLOR-CON) 10 MEQ tablet TAKE 1 TABLET DAILY 90 tablet 1   QUEtiapine  (SEROQUEL) 25 MG tablet Take 1 tablet (25 mg total) by mouth at bedtime. 90 tablet 1   SURE COMFORT PEN NEEDLES 31G X 5 MM MISC Use to inject insulin 1 time per day. 100 each 2   No current facility-administered medications for this visit.    PAST MEDICAL HISTORY: Past Medical History:  Diagnosis Date   Allergy    Dust Mites   Ankle pain    chronic   Diabetes mellitus, type 2 ([place])    Hyperlipidemia    Hypertension    Memory loss    Osteoarthritis    RENAL INSUFFICIENCY, CHRONIC 04/16/2010   Rhinitis    allergic nos    PAST SURGICAL HISTORY: Past Surgical History:  Procedure Laterality Date   CATARACT EXTRACTION     CHOLECYSTECTOMY     dental implants     PARTIAL HYSTERECTOMY     in her 26s    FAMILY  HISTORY: Family History  Problem Relation Age of Onset   Healthy Brother    Healthy Son    Healthy Son    Other Mother        unsure of history - "old age"   Other Father        unsure of history - "old age"   Heart attack Neg Hx    Colon cancer Neg Hx    Breast cancer Neg Hx    Stroke Neg Hx     SOCIAL HISTORY: Social History   Socioeconomic History   Marital status: Married    Spouse name: Not on file   Number of children: 2   Years of education: some college   Highest education level: Not on file  Occupational History   Occupation:  retired  Tobacco Use   Smoking status: Never   Smokeless tobacco: Never  Scientific laboratory technician Use: Never used  Substance and Sexual Activity   Alcohol use: No    Alcohol/week: 0.0 standard drinks of alcohol   Drug use: No   Sexual activity: Yes    Partners: Male  Other Topics Concern   Not on file  Social History Narrative   Husband is a Company secretary.   Right-handed.   1 cup caffeine daily.   Lives at home with husband.          Social Determinants of Health   Financial Resource Strain: Low Risk  (08/17/2021)   Overall Financial Resource Strain (CARDIA)    Difficulty of Paying Living Expenses: Not very hard   Food Insecurity: No Food Insecurity (07/16/2021)   Hunger Vital Sign    Worried About Running Out of Food in the Last Year: Never true    Ran Out of Food in the Last Year: Never true  Transportation Needs: No Transportation Needs (07/16/2021)   PRAPARE - Hydrologist (Medical): No    Lack of Transportation (Non-Medical): No  Physical Activity: Not on file  Stress: Not on file  Social Connections: Not on file  Intimate Partner Violence: Not on file      Marcial Pacas, M.D. Ph.D.  [facility] [address], [place], [place] [address] Ph: [phone] Fax: [phone]  CC:  Angel Pick, MD [address] [place],  [place] [address]  Colon Branch, MD

## 2021-10-21 NOTE — Addendum Note (Signed)
Addended by: Marcial Pacas on: 10/21/2021 06:06 PM   Modules accepted: Level of Service

## 2021-10-23 ENCOUNTER — Institutional Professional Consult (permissible substitution): Payer: Medicare Other | Admitting: Neurology

## 2021-11-09 ENCOUNTER — Telehealth: Payer: Self-pay | Admitting: Pharmacist

## 2021-11-09 NOTE — Telephone Encounter (Signed)
Opened in error

## 2021-11-16 ENCOUNTER — Ambulatory Visit: Payer: Medicare Other | Admitting: Pharmacist

## 2021-11-16 DIAGNOSIS — F03C18 Unspecified dementia, severe, with other behavioral disturbance: Secondary | ICD-10-CM

## 2021-11-16 DIAGNOSIS — E119 Type 2 diabetes mellitus without complications: Secondary | ICD-10-CM

## 2021-11-16 DIAGNOSIS — I10 Essential (primary) hypertension: Secondary | ICD-10-CM

## 2021-11-16 DIAGNOSIS — E785 Hyperlipidemia, unspecified: Secondary | ICD-10-CM

## 2021-11-16 MED ORDER — ATORVASTATIN CALCIUM 10 MG PO TABS: 10.0000 mg | ORAL_TABLET | Freq: Every day | ORAL | 1 refills | Status: DC

## 2021-11-16 MED ORDER — LOSARTAN POTASSIUM 50 MG PO TABS: 50.0000 mg | ORAL_TABLET | Freq: Every day | ORAL | 1 refills | Status: DC

## 2021-11-16 MED ORDER — AMLODIPINE BESYLATE 5 MG PO TABS: 5.0000 mg | ORAL_TABLET | Freq: Every day | ORAL | 1 refills | Status: DC

## 2021-11-16 NOTE — Progress Notes (Addendum)
Pharmacy Note  11/16/2021 Name: Angel Green MRN: [id number] DOB: [birth year]  Subjective: Angel Green is a 81 y.o. year old female who is a primary care patient of Angel Branch, MD. Clinical Pharmacist Practitioner referral was placed to assist with medication and diabetes management.    Engaged with patient's husband (due to cognitive changes) by phone   for follow up visit today.  Type 2 DM, insulin dependent:  Since our last visit patient has started Trulicity 4.'5mg'$  weekly. Continues to take Humalog Mix 75/25 daily though dose has been lowered to 56 units with breakfast recently. We were able to get Continuous Glucose Monitor Freestyle Libre 2 approved by [employer]. Patient has started using Continuous Glucose Monitor and has re-established with endocrinology  - seeing Angel Angel Green. Patient's husband reports they have connected with Angel Green office on [facility].  Hypertension: blood pressure has been at goal recently. Patient's husband endorsed that she is receiving blood pressure lowering medications - amlodipine '5mg'$  daily and losartan '50mg'$  daily Hyperlipidemia: patient's last LDL was slightly above goal of < 70. She is taking atorvastatin '10mg'$  daily. Denies myalgias.  Medication Management: Patinet's husband and niece that is living with them assist with medication management. Reviewed med list and updated. Angel Green reports he did not stop duloxetine '20mg'$  as mentioned by neurologist at last appointment. He states that even though patient has no pain and per husband is joyful and happy, he would like to continue duloxetine.   Recent Office Visits:  10/21/2021 - Neurology (Angel Green) Stopped duloxetine - since has no aggitation and denied pain. Take Claritiin as needed.   Objective: Review of patient status, including review of consultants reports, laboratory and other test data, was performed as part of comprehensive evaluation and provision of chronic care management services.   Lab  Results  Component Value Date   CREATININE 1.57 (H) 09/18/2021   CREATININE 1.46 (H) 08/11/2021   CREATININE 1.57 (H) 05/18/2021    Lab Results  Component Value Date   HGBA1C 7.5 (H) 09/18/2021       Component Value Date/Time   CHOL 157 09/07/2021 1531   TRIG 123.0 09/07/2021 1531   HDL 57.30 09/07/2021 1531   CHOLHDL 3 09/07/2021 1531   VLDL 24.6 09/07/2021 1531   LDLCALC 76 09/07/2021 1531     Clinical ASCVD: Yes  The ASCVD Risk score (Arnett DK, et al., 2019) failed to calculate for the following reasons:   The 2019 ASCVD risk score is only valid for ages 43 to 31    BP Readings from Last 3 Encounters:  10/21/21 137/83  09/21/21 124/82  09/07/21 132/76     Allergies  Allergen Reactions   Dust Mite Mixed Allergen Ext [Mite (D. Farinae)]     Medications Reviewed Today     Reviewed by Angel Green (Pharmacist) on 11/16/21 at 1710  Med List Status: <None>   Medication Order Taking? Sig Documenting Provider Last Dose Status Informant  amLODipine (NORVASC) 5 MG tablet [id number] Yes TAKE 1 TABLET DAILY Angel Branch, MD Taking Active   Apoaequorin 20 MG CAPS [id number] Yes Take 20 mg by mouth daily. [provider] Taking Active            Med Note Barbaraann Boys Aug 17, 2021  9:39 AM) Prevagen (over-the-counter)   atorvastatin (LIPITOR) 10 MG tablet [id number] Yes TAKE 1 TABLET DAILY Angel Branch, MD Taking Active   Blood Glucose Monitoring Suppl (FREESTYLE LITE)  DEVI [id number] No Use to monitor your blood sugars  Patient not taking: Reported on 11/16/2021   Angel Shin, MD Not Taking Active   Continuous Blood Gluc Sensor (FREESTYLE LIBRE 2 SENSOR) [place] [id number] Yes 1 Device by Does not apply route every 14 (fourteen) days. Angel Shin, MD Taking Active   Cyanocobalamin (B-12 PO) [id number] Yes Take 1 tablet by mouth daily. Takes 5000 mcg by mouth daily. [provider] Taking Active Multiple Informants  donepezil (ARICEPT) 10 MG  tablet [id number] Yes Take 1 tablet (10 mg total) by mouth at bedtime. Marcial Pacas, MD Taking Active   Dulaglutide 4.5 MG/0.5ML SOPN [id number] Yes Inject 4.5 mg into the skin once a week. [provider] Taking Active Spouse/Significant Other  DULoxetine (CYMBALTA) 20 MG capsule [id number] Yes Take 20 mg by mouth daily. [provider] Taking Active   EPINEPHrine 0.3 mg/0.3 mL IJ SOAJ injection [id number]  Inject 0.3 mg into the muscle once. Reported on 06/30/2015 [provider]  Active            Med Note (CANTER, [facility]   Tue Sep 21, 2016 10:28 AM) PRN  furosemide (LASIX) 20 MG tablet [id number] Yes TAKE 2 TABLETS DAILY  Patient taking differently: Take 20 mg by mouth 2 (two) times daily.   Angel Branch, MD Taking Active   glucose blood (FREESTYLE LITE) test strip [id number] No Use to monitor your blood sugars 4 times per day; E11.9  Patient not taking: Reported on 11/16/2021   Angel Shin, MD Not Taking Active   Insulin Lispro Prot & Lispro (HUMALOG MIX 75/25 KWIKPEN) (75-25) 100 UNIT/ML Claiborne Rigg [id number] Yes Inject 60 Units into the skin daily with breakfast. And pen needles 1/day  Patient taking differently: Inject 56 Units into the skin daily with breakfast. And pen needles 1/day   Angel Shin, MD Taking Active   Lancets (FREESTYLE) lancets [id number]  Use to monitor blood sugars 4 times per day, once before each meal and once at bedtime Angel Shin, MD  Active   loratadine (CLARITIN) 10 MG tablet [id number] Yes TAKE 1 TABLET DAILY Angel Branch, MD Taking Active   losartan (COZAAR) 50 MG tablet [id number] Yes TAKE 1 TABLET DAILY Angel Branch, MD Taking Active   memantine (NAMENDA) 10 MG tablet [id number] Yes Take 1 tablet (10 mg total) by mouth 2 (two) times daily. Marcial Pacas, MD Taking Active   Multiple Vitamins-Minerals (CENTRUM SILVER PO) [id number] Yes Take 1 tablet by mouth daily. [provider] Taking Active Multiple Informants  potassium chloride  (KLOR-CON) 10 MEQ tablet [id number] Yes TAKE 1 TABLET DAILY Angel Branch, MD Taking Active   QUEtiapine (SEROQUEL) 25 MG tablet [id number] Yes Take 1 tablet (25 mg total) by mouth at bedtime. Angel Branch, MD Taking Active   SURE COMFORT PEN NEEDLES 31G X 5 MM MISC [id number]  Use to inject insulin 1 time per day. Angel Shin, MD  Active             Patient Active Problem List   Diagnosis Date Noted   Severe late onset Alzheimer's dementia ([place]) 10/21/2021   PAD (peripheral artery disease) ([place]) 04/21/2020   Numbness 03/26/2020   Dementia ([place]) 08/14/2017   Cognitive impairment 09/22/2016   Morbid obesity ([place]) 06/30/2015   Diabetes ([place]) 04/23/2015   PCP NOTES >>>>> 12/28/2014   Leg ulcer, left ([place]) 10/12/2013   Annual physical exam 12/14/2011   Angioedema 10/22/2011   Tracheobronchitis, chronic ([place]) 08/20/2010  Fatigue 05/22/2010   CKD (chronic kidney disease) 04/16/2010   GERD 09/30/2008   EDEMA 06/10/2008   Hyperlipidemia 01/25/2007   ANKLE PAIN, CHRONIC 12/08/2006   Seasonal and perennial allergic rhinitis 10/20/2006   HTN (hypertension) 08/30/2006   OSTEOARTHROSIS, GENERALIZED, MULTIPLE SITES 08/30/2006     Medication Assistance:  None required.  Patient affirms current coverage meets needs.   Assessment/ Plan:  Hyperlipidemia - Continue atorvastatin '10mg'$  daily  Hypertension - Continue amlodipine and losartan; continue to check blood pressure 2 or 3 times per week Diabetes - continue to follow up with Angel Angel Green. Reviewed treatment of low blood glucose. Continue to take Trulicity 4.'5mg'$  weekly and Humalog Mix 75/25 Continue to use Continuous Glucose Monitor Medication management: assisted with refills for quetiapine, losartan, amlodipine and atorvastatin. Updated Rx for losartan, amlodipine and atorvastatin sent to [facility].  Discussed pros and cons of continuing duloxetine. Patient's husband has decided to continue duloxetine '20mg'$  daily for now.     Follow  Up:  No further follow up required: Patient to contact Clinical Pharmacist Practitioner if needs further assistance with medications. Continue regular follow up with PCP and endocrinologist.    Angel Green, PharmD Clinical Pharmacist [place] [phone]

## 2021-11-28 DIAGNOSIS — Z23 Encounter for immunization: Secondary | ICD-10-CM | POA: Diagnosis not present

## 2021-12-18 ENCOUNTER — Other Ambulatory Visit (INDEPENDENT_AMBULATORY_CARE_PROVIDER_SITE_OTHER): Payer: Medicare Other

## 2021-12-18 ENCOUNTER — Other Ambulatory Visit: Payer: Self-pay | Admitting: Endocrinology

## 2021-12-18 DIAGNOSIS — Z794 Long term (current) use of insulin: Secondary | ICD-10-CM

## 2021-12-18 DIAGNOSIS — E1165 Type 2 diabetes mellitus with hyperglycemia: Secondary | ICD-10-CM | POA: Diagnosis not present

## 2021-12-18 LAB — BASIC METABOLIC PANEL
BUN: 11 mg/dL (ref 6–23)
CO2: 28 mEq/L (ref 19–32)
Calcium: 9.4 mg/dL (ref 8.4–10.5)
Chloride: 104 mEq/L (ref 96–112)
Creatinine, Ser: 1.47 mg/dL — ABNORMAL HIGH (ref 0.40–1.20)
GFR: 32.76 mL/min — ABNORMAL LOW (ref >60.00)
Glucose, Bld: 150 mg/dL — ABNORMAL HIGH (ref 70–99)
Potassium: 3.8 mEq/L (ref 3.5–5.1)
Sodium: 139 mEq/L (ref 135–145)

## 2021-12-18 LAB — HEMOGLOBIN A1C: Hgb A1c MFr Bld: 7.9 % — ABNORMAL HIGH (ref 4.6–6.5)

## 2021-12-21 ENCOUNTER — Other Ambulatory Visit: Payer: Self-pay | Admitting: Internal Medicine

## 2021-12-21 DIAGNOSIS — I129 Hypertensive chronic kidney disease with stage 1 through stage 4 chronic kidney disease, or unspecified chronic kidney disease: Secondary | ICD-10-CM | POA: Diagnosis not present

## 2021-12-21 DIAGNOSIS — E1122 Type 2 diabetes mellitus with diabetic chronic kidney disease: Secondary | ICD-10-CM | POA: Diagnosis not present

## 2021-12-21 DIAGNOSIS — E876 Hypokalemia: Secondary | ICD-10-CM

## 2021-12-21 DIAGNOSIS — N2581 Secondary hyperparathyroidism of renal origin: Secondary | ICD-10-CM | POA: Diagnosis not present

## 2021-12-21 DIAGNOSIS — N1831 Chronic kidney disease, stage 3a: Secondary | ICD-10-CM | POA: Diagnosis not present

## 2021-12-21 DIAGNOSIS — F0390 Unspecified dementia without behavioral disturbance: Secondary | ICD-10-CM | POA: Diagnosis not present

## 2021-12-21 DIAGNOSIS — D631 Anemia in chronic kidney disease: Secondary | ICD-10-CM | POA: Diagnosis not present

## 2021-12-21 DIAGNOSIS — Z6841 Body Mass Index (BMI) 40.0 and over, adult: Secondary | ICD-10-CM | POA: Diagnosis not present

## 2021-12-21 DIAGNOSIS — E785 Hyperlipidemia, unspecified: Secondary | ICD-10-CM | POA: Diagnosis not present

## 2021-12-21 LAB — PROTEIN / CREATININE RATIO, URINE
Albumin, U: 11.2
Creatinine, Urine: 252.4

## 2021-12-23 ENCOUNTER — Telehealth: Payer: Self-pay | Admitting: *Deleted

## 2021-12-23 ENCOUNTER — Encounter: Payer: Self-pay | Admitting: Endocrinology

## 2021-12-23 ENCOUNTER — Ambulatory Visit (INDEPENDENT_AMBULATORY_CARE_PROVIDER_SITE_OTHER): Payer: Medicare Other | Admitting: Endocrinology

## 2021-12-23 VITALS — BP 130/80 | HR 88 | Ht 64.0 in | Wt 232.6 lb

## 2021-12-23 DIAGNOSIS — E1165 Type 2 diabetes mellitus with hyperglycemia: Secondary | ICD-10-CM | POA: Diagnosis not present

## 2021-12-23 DIAGNOSIS — Z794 Long term (current) use of insulin: Secondary | ICD-10-CM | POA: Diagnosis not present

## 2021-12-23 DIAGNOSIS — N1831 Chronic kidney disease, stage 3a: Secondary | ICD-10-CM

## 2021-12-23 MED ORDER — INSULIN LISPRO (1 UNIT DIAL) 100 UNIT/ML (KWIKPEN): PEN_INJECTOR | SUBCUTANEOUS | 1 refills | Status: DC

## 2021-12-23 NOTE — Chronic Care Management (AMB) (Signed)
  Care Coordination  Outreach Note  12/23/2021 Name: Angel Green MRN: [id number] DOB: 07-22-1940   Care Coordination Outreach Attempts: An unsuccessful telephone outreach was attempted today to offer the patient information about available care coordination services as a benefit of their health plan.   Follow Up Plan:  Additional outreach attempts will be made to offer the patient care coordination information and services.   Encounter Outcome:  No Answer  Julian Hy, [place] Direct Dial: [phone]

## 2021-12-23 NOTE — Progress Notes (Unsigned)
Patient ID: Angel Green, female   DOB: [birth year], 81 y.o.   MRN: [id number]           Reason for Appointment: Type II Diabetes follow-up   History of Present Illness   Diagnosis date: 1999  Previous history:  Non-insulin hypoglycemic drugs previously used: Trulicity was started in April 2019 and she has been on 4.5 mg since 2021 Insulin was started in 2010  A1c range in the last few years is: 6.1-9.1  Recent history:     Non-insulin hypoglycemic drugs: Trulicity 4.5 mg weekly     Insulin regimen: Humalog mix 56 units with breakfast            Side effects from medications: None  Current self management, blood sugar patterns and problems identified:  A1c is 7.9 compared to 7.5 She did start using the freestyle libre sensor as recommended on the last visit However the active time on this is only 62% with very few readings done in the afternoon and evenings  She is normally being taken care of by her husband and her niece Currently appears to have significantly high readings after her first meal midmorning and likely also has high readings after dinner Even though she was told to reduce her insulin by 4 units she is still appears to have occasional low normal or low readings either in the late afternoon or early morning  However her family has checked her fingersticks and her freestyle libre appears to be variably low compared to fingersticks  She is gradually gaining weight despite taking Trulicity Not able to do much physical exercise  Exercise: none  Diet management: Eating 3 meals a day, no specific meal plan Her breakfast usually consists of 1 pancake, sausage Dinner usually 6-7 PM Last nutritional counseling 8/23  Interpretation of the freestyle libre version 2 sensor download as follows  Blood sugars are overnight the lowest around 6-8 AM but generally are mildly increased around midnight averaging about 160 Blood sugars are generally progressively higher in  the late mornings until at least 2:00 and then improving Blood sugars are somewhat variable in the late afternoon and difficult to get a pattern Hypoglycemia has also been seen around 5-6 PM and no other significant hypoglycemia Blood sugars after dinner are rarely checked but they appear to be variably high including some readings high around midnight  CGM use % of time 62  2-week average/GV   Time in range        77%  % Time Above 180 19  % Time above 250 2  % Time Below 70 2     PRE-MEAL Fasting Lunch Dinner Bedtime Overall  Glucose range:       Averages: 102       POST-MEAL PC Breakfast PC Lunch PC Dinner  Glucose range:     Averages: 192  167    Weight control:  Wt Readings from Last 3 Encounters:  12/23/21 232 lb 9.6 oz (105.5 kg)  10/21/21 227 lb (103 kg)  09/21/21 224 lb 12.8 oz (102 kg)            Diabetes labs:  Lab Results  Component Value Date   HGBA1C 7.9 (H) 12/18/2021   HGBA1C 7.5 (H) 09/18/2021   HGBA1C 8.8 (A) 05/07/2021   Lab Results  Component Value Date   MICROALBUR <0.7 09/21/2021   LDLCALC 76 09/07/2021   CREATININE 1.47 (H) 12/18/2021    Lab Results  Component Value Date   FRUCTOSAMINE 299 (  H) 06/26/2020   FRUCTOSAMINE 370 (H) 04/22/2015     Allergies as of 12/23/2021       Reactions   Dust Mite Mixed Allergen Ext [mite (d. Farinae)]         Medication List        Accurate as of December 23, 2021 10:15 AM. If you have any questions, ask your nurse or doctor.          amLODipine 5 MG tablet Commonly known as: NORVASC Take 1 tablet (5 mg total) by mouth daily.   Apoaequorin 20 MG Caps Take 20 mg by mouth daily.   atorvastatin 10 MG tablet Commonly known as: LIPITOR Take 1 tablet (10 mg total) by mouth daily.   B-12 PO Take 1 tablet by mouth daily. Takes 5000 mcg by mouth daily.   CENTRUM SILVER PO Take 1 tablet by mouth daily.   donepezil 10 MG tablet Commonly known as: ARICEPT Take 1 tablet (10 mg total) by  mouth at bedtime.   Dulaglutide 4.5 MG/0.5ML Sopn Inject 4.5 mg into the skin once a week.   DULoxetine 20 MG capsule Commonly known as: CYMBALTA Take 20 mg by mouth daily.   EPINEPHrine 0.3 mg/0.3 mL Soaj injection Commonly known as: EPI-PEN Inject 0.3 mg into the muscle once. Reported on 06/30/2015   freestyle lancets Use to monitor blood sugars 4 times per day, once before each meal and once at bedtime   FreeStyle Libre 2 Sensor Misc 1 Device by Does not apply route every 14 (fourteen) days.   FreeStyle Lite Devi Use to monitor your blood sugars   FREESTYLE LITE test strip Generic drug: glucose blood Use to monitor your blood sugars 4 times per day; E11.9   furosemide 20 MG tablet Commonly known as: LASIX TAKE 2 TABLETS DAILY What changed:  how much to take when to take this   Insulin Lispro Prot & Lispro (75-25) 100 UNIT/ML Kwikpen Commonly known as: HumaLOG Mix 75/25 KwikPen Inject 60 Units into the skin daily with breakfast. And pen needles 1/day What changed: how much to take   loratadine 10 MG tablet Commonly known as: CLARITIN TAKE 1 TABLET DAILY   losartan 50 MG tablet Commonly known as: COZAAR Take 1 tablet (50 mg total) by mouth daily.   memantine 10 MG tablet Commonly known as: NAMENDA Take 1 tablet (10 mg total) by mouth 2 (two) times daily.   potassium chloride 10 MEQ tablet Commonly known as: KLOR-CON Take 1 tablet (10 mEq total) by mouth daily.   QUEtiapine 25 MG tablet Commonly known as: SEROquel Take 1 tablet (25 mg total) by mouth at bedtime.   Sure Comfort Pen Needles 31G X 5 MM Misc Generic drug: Insulin Pen Needle Use to inject insulin 1 time per day.        Allergies:  Allergies  Allergen Reactions   Dust Mite Mixed Allergen Ext [Mite (D. Farinae)]     Past Medical History:  Diagnosis Date   Allergy    Dust Mites   Ankle pain    chronic   Diabetes mellitus, type 2 ([place])    Hyperlipidemia    Hypertension    Memory  loss    Osteoarthritis    RENAL INSUFFICIENCY, CHRONIC 04/16/2010   Rhinitis    allergic nos    Past Surgical History:  Procedure Laterality Date   CATARACT EXTRACTION     CHOLECYSTECTOMY     dental implants     PARTIAL HYSTERECTOMY  in her 60s    Family History  Problem Relation Age of Onset   Healthy Brother    Healthy Son    Healthy Son    Other Mother        unsure of history - "old age"   Other Father        unsure of history - "old age"   Heart attack Neg Hx    Colon cancer Neg Hx    Breast cancer Neg Hx    Stroke Neg Hx     Social History:  reports that she has never smoked. She has never used smokeless tobacco. She reports that she does not drink alcohol and does not use drugs.  Review of Systems:  Last diabetic eye exam date 10/21, reportedly no retinopathy  Last foot exam date: 9/22  Hypertension:   Treatment includes losartan 50 mg daily  BP Readings from Last 3 Encounters:  12/23/21 130/80  10/21/21 137/83  09/21/21 124/82   She has had CKD followed by nephrologist about once a year, not on Kerendia or SGLT2 drugs No microalbuminuria  Lab Results  Component Value Date   CREATININE 1.47 (H) 12/18/2021   CREATININE 1.57 (H) 09/18/2021   CREATININE 1.46 (H) 08/11/2021    Lipid management: Treated with atorvastatin 10 mg daily prescribed by her PCP    Lab Results  Component Value Date   CHOL 157 09/07/2021   CHOL 134 06/17/2020   CHOL 124 03/24/2018   Lab Results  Component Value Date   HDL 57.30 09/07/2021   HDL 49.50 06/17/2020   HDL 41.10 03/24/2018   Lab Results  Component Value Date   LDLCALC 76 09/07/2021   LDLCALC 62 06/17/2020   LDLCALC 64 03/24/2018   Lab Results  Component Value Date   TRIG 123.0 09/07/2021   TRIG 114.0 06/17/2020   TRIG 96.0 03/24/2018   Lab Results  Component Value Date   CHOLHDL 3 09/07/2021   CHOLHDL 3 06/17/2020   CHOLHDL 3 03/24/2018   No results found for: "LDLDIRECT"      Examination:   BP 130/80 (BP Location: Left Arm, Patient Position: Sitting, Cuff Size: Normal)   Pulse 88   Ht '5\' 4"'$  (1.626 m)   Wt 232 lb 9.6 oz (105.5 kg)   SpO2 96%   BMI 39.93 kg/m   Body mass index is 39.93 kg/m.    ASSESSMENT/ PLAN:    Diabetes type 2 with severe obesity, hypertension and hyperlipidemia:   Current regimen: Premixed insulin once a day in the morning and Trulicity 4.5 mg weekly  See history of present illness for detailed discussion of current diabetes management, blood sugar patterns and problems identified  A1c is 7.9  Blood glucose control is somewhat worse with what appears to be mostly high postprandial readings at least at her first meal of the day even though it is relatively modest in carbohydrate Not getting adequate control with once a day premixed insulin with tendency to low normal or low readings late afternoon and occasionally overnight also Blood sugars are better assessed now with the freestyle libre sensor although this may be slightly lower than actual readings Also gaining weight recently  Recommendations for diabetes:  Start mealtime shot with 5 units of Humalog to help postprandial control without having to increase her premixed insulin Today discussed in detail the need for mealtime insulin to cover postprandial spikes, action of mealtime insulin, use of the insulin pen, timing and action of the rapid acting insulin  as well as starting dose and dosage titration to target the two-hour reading of under 180  Given graphic example of postprandial blood sugar patterns and timing of mealtime insulin explained on this as well as need for progressive titration Make sure she has some protein with every meal To reduce hypoglycemia she will reduce her insulin with the premixed dose down to 50 units for now May still consider adding Jardiance although not likely going to be effective with glucose control, current GFR about 33 Continue Trulicity  unchanged  Total visit time including counseling = 30 minutes  There are no Patient Instructions on file for this visit.   Elayne Snare 12/23/2021, 10:15 AM

## 2021-12-23 NOTE — Patient Instructions (Addendum)
5 units of Humalog before Bfst and supper  Mix insulin 50 units in am  Check blood sugars on waking up    Also check blood sugars about 2 hours after meals and do this after different meals by rotation  Recommended blood sugar levels on waking up are 90-130 and about 2 hours after meal is 130-160  Please bring your blood sugar monitor to each visit, thank you

## 2021-12-24 ENCOUNTER — Ambulatory Visit: Payer: Medicare Other | Admitting: Endocrinology

## 2021-12-25 ENCOUNTER — Encounter: Payer: Self-pay | Admitting: Endocrinology

## 2021-12-29 ENCOUNTER — Encounter: Payer: Self-pay | Admitting: Internal Medicine

## 2022-01-12 ENCOUNTER — Encounter: Payer: Self-pay | Admitting: Internal Medicine

## 2022-01-12 ENCOUNTER — Telehealth (INDEPENDENT_AMBULATORY_CARE_PROVIDER_SITE_OTHER): Payer: Medicare Other | Admitting: Internal Medicine

## 2022-01-12 VITALS — Ht 64.0 in | Wt 230.0 lb

## 2022-01-12 DIAGNOSIS — F03C18 Unspecified dementia, severe, with other behavioral disturbance: Secondary | ICD-10-CM | POA: Diagnosis not present

## 2022-01-12 MED ORDER — QUETIAPINE FUMARATE 50 MG PO TABS: 50.0000 mg | ORAL_TABLET | Freq: Every day | ORAL | 1 refills | Status: DC

## 2022-01-12 NOTE — Progress Notes (Signed)
Subjective:    Patient ID: Angel Green, female    DOB: March 10, 1940, 81 y.o.   MRN: [id number]  DOS:  01/12/2022 Type of visit - description: Virtual Visit via Telephone    I connected with above mentioned patient  by telephone and verified that I am speaking with the correct person using two identifiers.  Location of patient: home  Location of provider: office  Persons participating in the virtual visit: patient, provider   I discussed the limitations, risks, security and privacy concerns of performing an evaluation and management service by telephone and the availability of in person appointments. I also discussed with the patient that there may be a patient responsible charge related to this service. The patient expressed understanding and agreed to proceed.  Acute I spoke with the patient and her husband The husband is concerned because over the last year she is sleeping until 1 or 2 in the afternoon and  is widely awake many hours at night. There is no behavioral issues such as being combative or wander outside the house. She has not reported any LUTS. No fever chills No cough No major arthritic pains that the family can tell.  She continue experiencing "cold feet".     Review of Systems See above   Past Medical History:  Diagnosis Date   Allergy    Dust Mites   Ankle pain    chronic   Diabetes mellitus, type 2 ([place])    Hyperlipidemia    Hypertension    Memory loss    Osteoarthritis    RENAL INSUFFICIENCY, CHRONIC 04/16/2010   Rhinitis    allergic nos    Past Surgical History:  Procedure Laterality Date   CATARACT EXTRACTION     CHOLECYSTECTOMY     dental implants     PARTIAL HYSTERECTOMY     in her 30s    Current Outpatient Medications  Medication Instructions   amLODipine (NORVASC) 5 mg, Oral, Daily   Apoaequorin 20 mg, Oral, Daily   atorvastatin (LIPITOR) 10 mg, Oral, Daily   Blood Glucose Monitoring Suppl (FREESTYLE LITE) DEVI Use to monitor  your blood sugars   Continuous Blood Gluc Sensor (FREESTYLE LIBRE 2 SENSOR) MISC 1 Device, Does not apply, Every 14 days   Cyanocobalamin (B-12 PO) 1 tablet, Oral, Daily, Takes 5000 mcg by mouth daily.   donepezil (ARICEPT) 10 mg, Oral, Daily at bedtime   Dulaglutide 4.5 mg, Subcutaneous, Weekly   DULoxetine (CYMBALTA) 20 mg, Oral, Daily   EPINEPHrine (EPI-PEN) 0.3 mg,  Once   furosemide (LASIX) 20 MG tablet TAKE 2 TABLETS DAILY   glucose blood (FREESTYLE LITE) test strip Use to monitor your blood sugars 4 times per day; E11.9   insulin lispro (HUMALOG KWIKPEN) 100 UNIT/ML KwikPen 5-10 units before meals   Insulin Lispro Prot & Lispro (HUMALOG MIX 75/25 KWIKPEN) (75-25) 100 UNIT/ML Kwikpen 60 Units, Subcutaneous, Daily with breakfast, And pen needles 1/day   Lancets (FREESTYLE) lancets Use to monitor blood sugars 4 times per day, once before each meal and once at bedtime   loratadine (CLARITIN) 10 MG tablet TAKE 1 TABLET DAILY   losartan (COZAAR) 50 mg, Oral, Daily   memantine (NAMENDA) 10 mg, Oral, 2 times daily   Multiple Vitamins-Minerals (CENTRUM SILVER PO) 1 tablet, Oral, Daily,     potassium chloride (KLOR-CON) 10 MEQ tablet 10 mEq, Oral, Daily   QUEtiapine (SEROQUEL) 25 mg, Oral, Daily at bedtime   SURE COMFORT PEN NEEDLES 31G X 5 MM MISC Use  to inject insulin 1 time per day.       Objective:   Physical Exam Ht '5\' 4"'$  (1.626 m)   Wt 230 lb (104.3 kg)   BMI 39.48 kg/m  This is a telephone assessment, the patient sounded well, in no physical or mental distress.  She did not recognize me, did not know who I was.    Assessment     Assessment   DM  Dr Loanne Drilling + Neuropathy: Per foot exam 12-2014 HTN ---change ACE to ARB is 03/2014 Hyperlipidemia CRI  dx 2012  Sees Dr Arty Baumgartner  Morbid obesity DJD, had  chronic ankle pain Allergies -- dust mites , occ uses a inhaler , sees allergist, has shots q weeks started ~ 08-2014 Dementia:  MMSE 7/218: 24, rx Aricept.  B12, RPR, sed rate  and TSH normal MMSE 07/2017 :13 worse MRI brain 6/19:  prominent right temporal lobe volume loss.  PLAN Advance dementia Saw neurology August 2023, note reviewed: MRI show significant brain atrophy, blood work showed no treatable etiology. On Aricept and Namenda, is suspected that symptoms will worsen over time.  Next visit in 1 year. At this point, she has reversal of her sleep habits, a common  occurrence in dementia.  Otherwise no behavioral issues and no apparent physical complaints. We agreed on the following: Increase Seroquel from 25 mg to 50 mg nightly Try to put her in bed late and  try to wake her up as early as possible and keep her distracted during the daytime.  The goal is to try to reverse the inverted sleep schedule. She will let me know in few weeks how this is going.     I discussed the assessment and treatment plan with the patient. The patient was provided an opportunity to ask questions and all were answered. The patient agreed with the plan and demonstrated an understanding of the instructions.   The patient was advised to call back or seek an in-person evaluation if the symptoms worsen or if the condition fails to improve as anticipated.  I provided 19 minutes of non-face-to-face time during this encounter.  Kathlene November, MD

## 2022-01-12 NOTE — Assessment & Plan Note (Signed)
Advance dementia Saw neurology August 2023, note reviewed: MRI show significant brain atrophy, blood work showed no treatable etiology. On Aricept and Namenda, is suspected that symptoms will worsen over time.  Next visit in 1 year. At this point, she has reversal of her sleep habits, a common  occurrence in dementia.  Otherwise no behavioral issues and no apparent physical complaints. We agreed on the following: Increase Seroquel from 25 mg to 50 mg nightly Try to put her in bed late and  try to wake her up as early as possible and keep her distracted during the daytime.  The goal is to try to reverse the inverted sleep schedule. She will let me know in few weeks how this is going.

## 2022-01-13 ENCOUNTER — Other Ambulatory Visit: Payer: Self-pay | Admitting: Internal Medicine

## 2022-01-13 DIAGNOSIS — Z1231 Encounter for screening mammogram for malignant neoplasm of breast: Secondary | ICD-10-CM

## 2022-01-18 ENCOUNTER — Telehealth: Payer: Self-pay | Admitting: Internal Medicine

## 2022-01-18 MED ORDER — FUROSEMIDE 20 MG PO TABS: 40.0000 mg | ORAL_TABLET | Freq: Every day | ORAL | 1 refills | Status: DC

## 2022-01-18 NOTE — Telephone Encounter (Signed)
Rx sent 

## 2022-01-18 NOTE — Telephone Encounter (Signed)
Medication:   furosemide (LASIX) 20 MG tablet [[id number]]   Has the patient contacted their pharmacy? No. (If no, request that the patient contact the pharmacy for the refill.) (If yes, when and what did the pharmacy advise?)  Preferred Pharmacy (with phone number or street name):   [facility], [place] [address] [place] [address] Phone: [phone]  Fax: [phone]   Agent: Please be advised that RX refills may take up to 3 business days. We ask that you follow-up with your pharmacy.

## 2022-01-26 ENCOUNTER — Encounter: Payer: Self-pay | Admitting: Family

## 2022-01-26 ENCOUNTER — Ambulatory Visit (INDEPENDENT_AMBULATORY_CARE_PROVIDER_SITE_OTHER): Payer: Medicare Other | Admitting: Family

## 2022-01-26 VITALS — BP 116/80 | HR 98 | Temp 97.8°F | Resp 12 | Ht 64.0 in | Wt 220.0 lb

## 2022-01-26 DIAGNOSIS — R630 Anorexia: Secondary | ICD-10-CM

## 2022-01-26 DIAGNOSIS — F03C18 Unspecified dementia, severe, with other behavioral disturbance: Secondary | ICD-10-CM | POA: Diagnosis not present

## 2022-01-26 DIAGNOSIS — R5383 Other fatigue: Secondary | ICD-10-CM

## 2022-01-26 DIAGNOSIS — R4689 Other symptoms and signs involving appearance and behavior: Secondary | ICD-10-CM | POA: Diagnosis not present

## 2022-01-26 MED ORDER — AMLODIPINE BESYLATE 5 MG PO TABS: 5.0000 mg | ORAL_TABLET | Freq: Every day | ORAL | 1 refills | Status: DC

## 2022-01-26 MED ORDER — CEFDINIR 300 MG PO CAPS: 300.0000 mg | ORAL_CAPSULE | Freq: Two times a day (BID) | ORAL | 0 refills | Status: DC

## 2022-01-26 NOTE — Progress Notes (Signed)
Angel Green is a 81 y.o. 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[place]) 10/21/2021   PAD (peripheral artery disease) ([place]) 04/21/2020   Numbness 03/26/2020   Dementia ([place]) 08/14/2017   Cognitive impairment 09/22/2016   Morbid obesity ([place]) 06/30/2015   Diabetes ([place]) 04/23/2015   PCP NOTES >>>>> 12/28/2014   Leg ulcer, left ([place]) 10/12/2013   Annual physical exam 12/14/2011   Angioedema 10/22/2011   Tracheobronchitis, chronic ([place]) 08/20/2010   Fatigue 05/22/2010   CKD (chronic kidney disease) 04/16/2010   GERD 09/30/2008   EDEMA 06/10/2008   Hyperlipidemia 01/25/2007   ANKLE PAIN, CHRONIC 12/08/2006   Seasonal and perennial allergic rhinitis 10/20/2006   HTN (hypertension) 08/30/2006   OSTEOARTHROSIS, GENERALIZED, MULTIPLE SITES 08/30/2006    Current Outpatient Medications  Medication Sig Dispense Refill   Apoaequorin 20 MG CAPS Take 20 mg by mouth daily.     atorvastatin (LIPITOR) 10 MG tablet Take 1 tablet (10 mg total) by mouth daily. 90 tablet 1   Blood Glucose Monitoring Suppl (FREESTYLE LITE) DEVI Use to monitor your blood sugars 1 each 0   cefdinir (OMNICEF) 300 MG capsule Take 1 capsule (300 mg total) by mouth 2 (two) times daily. 14 capsule 0   Continuous Blood Gluc Sensor (FREESTYLE LIBRE 2 SENSOR) MISC 1 Device by Does not apply route every 14 (fourteen) days. 6 each 3   Cyanocobalamin (B-12 PO) Take 1 tablet by mouth daily. Takes 5000 mcg by mouth daily.     donepezil (ARICEPT) 10 MG tablet Take 1 tablet (10 mg total) by mouth at bedtime. 90 tablet 3   Dulaglutide 4.5 MG/0.5ML SOPN Inject 4.5 mg into the skin once a week.     DULoxetine (CYMBALTA) 20 MG capsule Take 20 mg by mouth daily.     EPINEPHrine 0.3 mg/0.3 mL IJ SOAJ injection Inject 0.3 mg into the muscle once. Reported on 06/30/2015     furosemide (LASIX) 20 MG tablet Take 2 tablets (40 mg  total) by mouth daily. 180 tablet 1   glucose blood (FREESTYLE LITE) test strip Use to monitor your blood sugars 4 times per day; E11.9 400 each 12   insulin lispro (HUMALOG KWIKPEN) 100 UNIT/ML KwikPen 5-10 units before meals (Patient taking differently: 5 UNITS BID) 15 mL 1   Insulin Lispro Prot & Lispro (HUMALOG MIX 75/25 KWIKPEN) (75-25) 100 UNIT/ML Kwikpen Inject 60 Units into the skin daily with breakfast. And pen needles 1/day (Patient taking differently: Inject 50 Units into the skin daily with breakfast. And pen needles 1/day) 75 mL 3   Lancets (FREESTYLE) lancets Use to monitor blood sugars 4 times per day, once before each meal and once at bedtime 400 each 12   loratadine (CLARITIN) 10 MG tablet TAKE 1 TABLET DAILY 90 tablet 3   losartan (COZAAR) 50 MG tablet Take 1 tablet (50 mg total) by mouth daily. 90 tablet 1   memantine (NAMENDA) 10 MG tablet Take 1 tablet (10 mg total) by mouth 2 (two) times daily. 180 tablet 3   Multiple Vitamins-Minerals (CENTRUM SILVER PO) Take 1 tablet by mouth daily.     potassium chloride (KLOR-CON) 10 MEQ tablet Take 1 tablet (10 mEq total) by mouth daily. 90 tablet 1   QUEtiapine (SEROQUEL) 50 MG tablet Take 1 tablet (50 mg total) by mouth at bedtime. 90 tablet 1   SURE COMFORT PEN NEEDLES 31G X  5 MM MISC Use to inject insulin 1 time per day. 100 each 2   amLODipine (NORVASC) 5 MG tablet Take 1 tablet (5 mg total) by mouth daily. 90 tablet 1   No current facility-administered medications for this visit.    Allergies: Dust mite mixed allergen ext [mite (d. farinae)]  Past Medical History:  Diagnosis Date   Allergy    Dust Mites   Ankle pain    chronic   Diabetes mellitus, type 2 ([place])    Hyperlipidemia    Hypertension    Memory loss    Osteoarthritis    RENAL INSUFFICIENCY, CHRONIC 04/16/2010   Rhinitis    allergic nos    Past Surgical History:  Procedure Laterality Date   CATARACT EXTRACTION     CHOLECYSTECTOMY     dental implants      PARTIAL HYSTERECTOMY     in her 66s    Family History  Problem Relation Age of Onset   Healthy Brother    Healthy Son    Healthy Son    Other Mother        unsure of history - "old age"   Other Father        unsure of history - "old age"   Heart attack Neg Hx    Colon cancer Neg Hx    Breast cancer Neg Hx    Stroke Neg Hx     Social History   Tobacco Use   Smoking status: Never   Smokeless tobacco: Never  Substance Use Topics   Alcohol use: No    Alcohol/week: 0.0 standard drinks of alcohol    Subjective:  Accompanied by family members who help provide history; known history of dementia; concern that has had decreased appetite/ increased sleeping for the past 3-4 days; family notes that she may have " a day here or there" where she doesn't eat but episodes do not last this long; no fever; no obvious behavioral changes;  Have been able to get patient to eat small amounts/ drink Ensure;  Per daughter, nose has been running some for the past few days; no chronic allergy issues;     Objective:  Vitals:   01/26/22 1444  BP: 116/80  Pulse: 98  Resp: 12  Temp: 97.8 F (36.6 C)  TempSrc: Oral  SpO2: 96%  Weight: 220 lb (99.8 kg)  Height: _0  (1.626 m)    General: Well developed, well nourished, in no acute distress  Skin : Warm and dry.  Head: Normocephalic and atraumatic  Eyes: Sclera and conjunctiva clear; pupils round and reactive to light; extraocular movements intact  Ears: External normal; canals clear; tympanic membranes congested bilaterally Oropharynx: Pink, supple. No suspicious lesions  Neck: Supple without thyromegaly, adenopathy  Lungs: Respirations unlabored; clear to auscultation bilaterally without wheeze, rales, rhonchi  CVS exam: normal rate and regular rhythm.  Neurologic: Alert and oriented; speech intact; face symmetrical; moves all extremities well; CNII-XII intact without focal deficit   Assessment:  1. Other fatigue   2. Severe dementia  with other behavioral disturbance, unspecified dementia type (HCC)   3. Behavioral change   4. Decreased appetite     Plan:  Due to sudden onset of symptoms, concern for underlying UTI; patient is unable to give sample today; will go ahead and start [place] to cover for urinary symptoms and respiratory symptoms; check CBC, CMP today; family will try to get sample at home and bring back to office so urine culture can be  checked; follow up to be determined.   No follow-ups on file.  Orders Placed This Encounter  Procedures   CBC with Differential/Platelet   Comp Met (CMET)    Requested Prescriptions   Signed Prescriptions Disp Refills   amLODipine (NORVASC) 5 MG tablet 90 tablet 1    Sig: Take 1 tablet (5 mg total) by mouth daily.   cefdinir (OMNICEF) 300 MG capsule 14 capsule 0    Sig: Take 1 capsule (300 mg total) by mouth 2 (two) times daily.

## 2022-01-27 ENCOUNTER — Telehealth: Payer: Self-pay | Admitting: Neurology

## 2022-01-27 LAB — CBC WITH DIFFERENTIAL/PLATELET
Basophils Absolute: 0.1 10*3/uL (ref 0.0–0.1)
Basophils Relative: 0.6 % (ref 0.0–3.0)
Eosinophils Absolute: 0.1 10*3/uL (ref 0.0–0.7)
Eosinophils Relative: 0.8 % (ref 0.0–5.0)
HCT: 43.4 % (ref 36.0–46.0)
Hemoglobin: 14.3 g/dL (ref 12.0–15.0)
Lymphocytes Relative: 21 % (ref 12.0–46.0)
Lymphs Abs: 2.4 10*3/uL (ref 0.7–4.0)
MCHC: 33 g/dL (ref 30.0–36.0)
MCV: 97.5 fl (ref 78.0–100.0)
Monocytes Absolute: 0.5 10*3/uL (ref 0.1–1.0)
Monocytes Relative: 4.6 % (ref 3.0–12.0)
Neutro Abs: 8.5 10*3/uL — ABNORMAL HIGH (ref 1.4–7.7)
Neutrophils Relative %: 73 % (ref 43.0–77.0)
Platelets: 212 10*3/uL (ref 150.0–400.0)
RBC: 4.45 Mil/uL (ref 3.87–5.11)
RDW: 14.3 % (ref 11.5–15.5)
WBC: 11.6 10*3/uL — ABNORMAL HIGH (ref 4.0–10.5)

## 2022-01-27 LAB — COMPREHENSIVE METABOLIC PANEL
ALT: 14 U/L (ref 0–35)
AST: 16 U/L (ref 0–37)
Albumin: 4 g/dL (ref 3.5–5.2)
Alkaline Phosphatase: 87 U/L (ref 39–117)
BUN: 19 mg/dL (ref 6–23)
CO2: 28 mEq/L (ref 19–32)
Calcium: 9.9 mg/dL (ref 8.4–10.5)
Chloride: 98 mEq/L (ref 96–112)
Creatinine, Ser: 2.26 mg/dL — ABNORMAL HIGH (ref 0.40–1.20)
GFR: 19.53 mL/min — ABNORMAL LOW (ref >60.00)
Glucose, Bld: 162 mg/dL — ABNORMAL HIGH (ref 70–99)
Potassium: 3.6 mEq/L (ref 3.5–5.1)
Sodium: 139 mEq/L (ref 135–145)
Total Bilirubin: 0.6 mg/dL (ref 0.2–1.2)
Total Protein: 7.7 g/dL (ref 6.0–8.3)

## 2022-01-27 NOTE — Telephone Encounter (Signed)
Pt husband is calling. Stated pt needs a refill on medication Apoaequorin 20 MG CAPS. Said he is not for sure if Dr. Krista Blue prescript this medication but asked would she please refill it because he don't remember who wrot the prescription.

## 2022-01-27 NOTE — Telephone Encounter (Signed)
I revived chart Apoaequorin or Preavagen is a over the counter supplement.  He can purchase at any local pharmacy. If rx is needed please ask PCP.

## 2022-01-28 ENCOUNTER — Other Ambulatory Visit: Payer: Self-pay | Admitting: Family

## 2022-01-28 ENCOUNTER — Other Ambulatory Visit: Payer: Medicare Other

## 2022-01-28 DIAGNOSIS — E1165 Type 2 diabetes mellitus with hyperglycemia: Secondary | ICD-10-CM

## 2022-01-28 DIAGNOSIS — R300 Dysuria: Secondary | ICD-10-CM | POA: Diagnosis not present

## 2022-01-28 DIAGNOSIS — R7989 Other specified abnormal findings of blood chemistry: Secondary | ICD-10-CM

## 2022-01-28 NOTE — Progress Notes (Signed)
Order placed per Jodi Mourning, NP from appointment date 01/26/2022

## 2022-01-29 ENCOUNTER — Other Ambulatory Visit: Payer: Self-pay

## 2022-01-29 ENCOUNTER — Other Ambulatory Visit (INDEPENDENT_AMBULATORY_CARE_PROVIDER_SITE_OTHER): Payer: Medicare Other

## 2022-01-29 ENCOUNTER — Emergency Department (HOSPITAL_COMMUNITY)
Admission: EM | Admit: 2022-01-29 | Discharge: 2022-01-30 | Disposition: A | Discharge status: Home / Self Care | Payer: Medicare Other | Attending: Emergency Medicine | Admitting: Emergency Medicine

## 2022-01-29 DIAGNOSIS — I10 Essential (primary) hypertension: Secondary | ICD-10-CM | POA: Insufficient documentation

## 2022-01-29 DIAGNOSIS — R7989 Other specified abnormal findings of blood chemistry: Secondary | ICD-10-CM | POA: Diagnosis not present

## 2022-01-29 DIAGNOSIS — E860 Dehydration: Secondary | ICD-10-CM | POA: Insufficient documentation

## 2022-01-29 DIAGNOSIS — N179 Acute kidney failure, unspecified: Secondary | ICD-10-CM | POA: Diagnosis not present

## 2022-01-29 DIAGNOSIS — Z79899 Other long term (current) drug therapy: Secondary | ICD-10-CM | POA: Diagnosis not present

## 2022-01-29 LAB — COMPREHENSIVE METABOLIC PANEL
AG Ratio: 1.1 (calc) (ref 1.0–2.5)
ALT: 17 U/L (ref 6–29)
AST: 32 U/L (ref 10–35)
Albumin: 3.7 g/dL (ref 3.6–5.1)
Alkaline phosphatase (APISO): 84 U/L (ref 37–153)
BUN/Creatinine Ratio: 9 (calc) (ref 6–22)
BUN: 20 mg/dL (ref 7–25)
CO2: 25 mmol/L (ref 20–32)
Calcium: 9.5 mg/dL (ref 8.6–10.4)
Chloride: 101 mmol/L (ref 98–110)
Creat: 2.12 mg/dL — ABNORMAL HIGH (ref 0.60–0.95)
Globulin: 3.5 g/dL (calc) (ref 1.9–3.7)
Glucose, Bld: 209 mg/dL — ABNORMAL HIGH (ref 65–99)
Potassium: 4.1 mmol/L (ref 3.5–5.3)
Sodium: 138 mmol/L (ref 135–146)
Total Bilirubin: 0.6 mg/dL (ref 0.2–1.2)
Total Protein: 7.2 g/dL (ref 6.1–8.1)

## 2022-01-29 LAB — CBC
HCT: 43.1 % (ref 36.0–46.0)
Hemoglobin: 13.8 g/dL (ref 12.0–15.0)
MCH: 31.1 pg (ref 26.0–34.0)
MCHC: 32 g/dL (ref 30.0–36.0)
MCV: 97.1 fL (ref 80.0–100.0)
Platelets: 239 10*3/uL (ref 150–400)
RBC: 4.44 MIL/uL (ref 3.87–5.11)
RDW: 13.9 % (ref 11.5–15.5)
WBC: 7.3 10*3/uL (ref 4.0–10.5)
nRBC: 0 % (ref 0.0–0.2)

## 2022-01-29 MED ORDER — LACTATED RINGERS IV BOLUS
1000.0000 mL | Freq: Once | INTRAVENOUS | Status: AC
  Administered 2022-01-29: 1000 mL via INTRAVENOUS

## 2022-01-29 MED ORDER — LACTATED RINGERS IV SOLN: INTRAVENOUS | Status: DC

## 2022-01-29 NOTE — ED Triage Notes (Signed)
Pt presents with husband after having labs done at her PCP that were abnormal.  Husband reports pt has not been eating/drinking as normal for a couple of days.  Husband reports that her doctor called and said her kidneys were not working well and she needed to come to the ED.

## 2022-01-29 NOTE — ED Provider Notes (Signed)
[facility] EMERGENCY DEPARTMENT Provider Note   CSN: [id number] Arrival date & time: 01/29/22  1725     History  Chief Complaint  Patient presents with   Abnormal Labs   Failure To Thrive    Angel Green is a 81 y.o. female.  HPI    Patient has a history of hyperlipidemia, hypertension, arthritis, chronic kidney disease, dementia.  Patient has had trouble with decreased p.o. intake over the last several days.  She has not been eating and drinking as much as she normally does.  She was seen by the primary care d provider on November 5.  She had laboratory tests performed and on November 5 that showed that her creatinine was elevated at 2.26.  1 month prior it was 1.47.  Patient followed up with her doctor today to have her blood test rechecked.  Her creatinine was 2.12.  She was instructed to come to ED for further evaluation.  Patient denies any vomiting or diarrhea.  There is been no fevers or chills. Home Medications Prior to Admission medications   Medication Sig Start Date End Date Taking? Authorizing Provider  amLODipine (NORVASC) 5 MG tablet Take 1 tablet (5 mg total) by mouth daily. 01/26/22   Marrian Salvage, FNP  Apoaequorin 20 MG CAPS Take 20 mg by mouth daily.    [provider]  atorvastatin (LIPITOR) 10 MG tablet Take 1 tablet (10 mg total) by mouth daily. 11/16/21   Colon Branch, MD  Blood Glucose Monitoring Suppl (FREESTYLE LITE) DEVI Use to monitor your blood sugars 12/26/17   Renato Shin, MD  cefdinir (OMNICEF) 300 MG capsule Take 1 capsule (300 mg total) by mouth 2 (two) times daily. 01/26/22   Marrian Salvage, FNP  Continuous Blood Gluc Sensor (FREESTYLE LIBRE 2 SENSOR) MISC 1 Device by Does not apply route every 14 (fourteen) days. 03/04/21   Renato Shin, MD  Cyanocobalamin (B-12 PO) Take 1 tablet by mouth daily. Takes 5000 mcg by mouth daily.    [provider]  donepezil (ARICEPT) 10 MG tablet Take 1 tablet (10  mg total) by mouth at bedtime. 10/21/21   Marcial Pacas, MD  Dulaglutide 4.5 MG/0.5ML SOPN Inject 4.5 mg into the skin once a week.    [provider]  DULoxetine (CYMBALTA) 20 MG capsule Take 20 mg by mouth daily.    [provider]  EPINEPHrine 0.3 mg/0.3 mL IJ SOAJ injection Inject 0.3 mg into the muscle once. Reported on 06/30/2015    [provider]  furosemide (LASIX) 20 MG tablet Take 2 tablets (40 mg total) by mouth daily. 01/18/22   Colon Branch, MD  glucose blood (FREESTYLE LITE) test strip Use to monitor your blood sugars 4 times per day; E11.9 01/12/19   Renato Shin, MD  insulin lispro (HUMALOG KWIKPEN) 100 UNIT/ML KwikPen 5-10 units before meals Patient taking differently: 5 UNITS BID 12/23/21   Elayne Snare, MD  Insulin Lispro Prot & Lispro (HUMALOG MIX 75/25 KWIKPEN) (75-25) 100 UNIT/ML Kwikpen Inject 60 Units into the skin daily with breakfast. And pen needles 1/day Patient taking differently: Inject 50 Units into the skin daily with breakfast. And pen needles 1/day 05/07/21   Renato Shin, MD  Lancets (FREESTYLE) lancets Use to monitor blood sugars 4 times per day, once before each meal and once at bedtime 12/26/17   Renato Shin, MD  loratadine (CLARITIN) 10 MG tablet TAKE 1 TABLET DAILY 03/30/21   Colon Branch, MD  losartan (COZAAR) 50 MG tablet Take 1 tablet (50 mg total) by mouth daily. 11/16/21   Colon Branch, MD  memantine (NAMENDA) 10 MG tablet Take 1 tablet (10 mg total) by mouth 2 (two) times daily. 10/21/21   Marcial Pacas, MD  Multiple Vitamins-Minerals (CENTRUM SILVER PO) Take 1 tablet by mouth daily.    [provider]  potassium chloride (KLOR-CON) 10 MEQ tablet Take 1 tablet (10 mEq total) by mouth daily. 12/21/21   Colon Branch, MD  QUEtiapine (SEROQUEL) 50 MG tablet Take 1 tablet (50 mg total) by mouth at bedtime. 01/12/22   Colon Branch, MD  SURE COMFORT PEN NEEDLES 31G X 5 MM MISC Use to inject insulin 1 time per day. 09/09/20   Renato Shin, MD       Allergies    Dust mite mixed allergen ext [mite (d. farinae)]    Review of Systems   Review of Systems  Physical Exam Updated Vital Signs BP 125/87   Pulse (!) 104   Temp 98.2 F (36.8 C) (Oral)   Resp 16   SpO2 93%  Physical Exam Vitals and nursing note reviewed.  Constitutional:      Appearance: She is well-developed. She is not diaphoretic.  HENT:     Head: Normocephalic and atraumatic.     Right Ear: External ear normal.     Left Ear: External ear normal.  Eyes:     General: No scleral icterus.       Right eye: No discharge.        Left eye: No discharge.     Conjunctiva/sclera: Conjunctivae normal.  Neck:     Trachea: No tracheal deviation.  Cardiovascular:     Rate and Rhythm: Normal rate and regular rhythm.  Pulmonary:     Effort: Pulmonary effort is normal. No respiratory distress.     Breath sounds: Normal breath sounds. No stridor. No wheezing or rales.  Abdominal:     General: Bowel sounds are normal. There is no distension.     Palpations: Abdomen is soft.     Tenderness: There is no abdominal tenderness. There is no guarding or rebound.  Musculoskeletal:        General: No tenderness or deformity.     Cervical back: Neck supple.  Skin:    General: Skin is warm and dry.     Findings: No rash.  Neurological:     General: No focal deficit present.     Mental Status: She is alert.     Cranial Nerves: No cranial nerve deficit, dysarthria or facial asymmetry.     Sensory: No sensory deficit.     Motor: No abnormal muscle tone or seizure activity.     Coordination: Coordination normal.  Psychiatric:        Mood and Affect: Mood normal.     ED Results / Procedures / Treatments   Labs (all labs ordered are listed, but only abnormal results are displayed) Labs Reviewed  CBC  URINALYSIS, ROUTINE W REFLEX MICROSCOPIC  COMPREHENSIVE METABOLIC PANEL  I-STAT CHEM 8, ED    EKG None  Radiology No results found.  Procedures Procedures     Medications Ordered in ED Medications  lactated ringers infusion ( Intravenous New Bag/Given 01/29/22 2328)  lactated ringers bolus 1,000 mL (1,000 mLs Intravenous New Bag/Given 01/29/22 2132)    ED Course/ Medical Decision Making/ A&P Clinical Course as of 01/29/22 2334  Fri Jan 29, 2022  2333 Normal CBC.  Metabolic panel urinalysis currently pending [JK]    Clinical Course User Index [JK] Dorie Rank, MD                           Medical Decision Making Problems Addressed: AKI (acute kidney injury) [facility]): acute illness or injury Dehydration: acute illness or injury  Amount and/or Complexity of Data Reviewed Labs: ordered. Decision-making details documented in ED Course.    Details: Labs reviewed.  Notable for  Risk Prescription drug management.   She presented to the ED for evaluation of abnormal laboratory test dehydration.  Patient had outpatient laboratory test that showed an elevated creatinine.  Patient has not had any vomiting or diarrhea.  It is possible she could have decreased fluid intake with her dementia.  Patient has been given a liter of lactated Ringer's.  Will check her electrolyte panel and see if her creatinine is improving.  If so, she may be appropriate for outpatient manage patient, continued oral rehydration.  Labs pending at shift change        Final Clinical Impression(s) / ED Diagnoses Final diagnoses:  Dehydration  AKI (acute kidney injury) [facility])    Rx / DC Orders ED Discharge Orders     None         Dorie Rank, MD 01/29/22 2334

## 2022-01-29 NOTE — ED Provider Triage Note (Signed)
Emergency Medicine Provider Triage Evaluation Note  Natalie Mceuen , a 81 y.o. female  was evaluated in triage.  Pt presents with her husband provides majority the history.  Patient has dementia at baseline.  Per husband, the patient has been not eating or drinking for the last 2 to 3 days.  The patient was seen today and noted to have decreased kidney function as a result.  The patient husband denies any nausea, vomiting, diarrhea, fevers at home.  The patient denies any pain.  Review of Systems  Positive:  Negative:   Physical Exam  BP 107/60 (BP Location: Right Arm)   Pulse 92   Temp 98.2 F (36.8 C) (Oral)   Resp 16   SpO2 96%  Gen:   Awake, no distress   Resp:  Normal effort  MSK:   Moves extremities without difficulty  Other:    Medical Decision Making  Medically screening exam initiated at 6:23 PM.  Appropriate orders placed.  Francella Willison was informed that the remainder of the evaluation will be completed by another provider, this initial triage assessment does not replace that evaluation, and the importance of remaining in the ED until their evaluation is complete.     Azucena Cecil, PA-C 01/29/22 1823

## 2022-01-30 DIAGNOSIS — E860 Dehydration: Secondary | ICD-10-CM | POA: Diagnosis not present

## 2022-01-30 LAB — COMPREHENSIVE METABOLIC PANEL
ALT: 22 U/L (ref 0–44)
AST: 38 U/L (ref 15–41)
Albumin: 3.4 g/dL — ABNORMAL LOW (ref 3.5–5.0)
Alkaline Phosphatase: 78 U/L (ref 38–126)
Anion gap: 19 — ABNORMAL HIGH (ref 5–15)
BUN: 21 mg/dL (ref 8–23)
CO2: 19 mmol/L — ABNORMAL LOW (ref 22–32)
Calcium: 9.8 mg/dL (ref 8.9–10.3)
Chloride: 106 mmol/L (ref 98–111)
Creatinine, Ser: 2.28 mg/dL — ABNORMAL HIGH (ref 0.44–1.00)
GFR, Estimated: 21 mL/min — ABNORMAL LOW (ref >60)
Glucose, Bld: 146 mg/dL — ABNORMAL HIGH (ref 70–99)
Potassium: 4 mmol/L (ref 3.5–5.1)
Sodium: 144 mmol/L (ref 135–145)
Total Bilirubin: 0.4 mg/dL (ref 0.3–1.2)
Total Protein: 8 g/dL (ref 6.5–8.1)

## 2022-01-30 LAB — URINALYSIS, ROUTINE W REFLEX MICROSCOPIC
Bilirubin Urine: NEGATIVE
Glucose, UA: NEGATIVE mg/dL
Hgb urine dipstick: NEGATIVE
Ketones, ur: NEGATIVE mg/dL
Leukocytes,Ua: NEGATIVE
Nitrite: NEGATIVE
Protein, ur: NEGATIVE mg/dL
Specific Gravity, Urine: 1.015 (ref 1.005–1.030)
pH: 5 (ref 5.0–8.0)

## 2022-01-30 LAB — I-STAT CHEM 8, ED
BUN: 21 mg/dL (ref 8–23)
Calcium, Ion: 1.21 mmol/L (ref 1.15–1.40)
Chloride: 100 mmol/L (ref 98–111)
Creatinine, Ser: 2.2 mg/dL — ABNORMAL HIGH (ref 0.44–1.00)
Glucose, Bld: 157 mg/dL — ABNORMAL HIGH (ref 70–99)
HCT: 39 % (ref 36.0–46.0)
Hemoglobin: 13.3 g/dL (ref 12.0–15.0)
Potassium: 3.7 mmol/L (ref 3.5–5.1)
Sodium: 139 mmol/L (ref 135–145)
TCO2: 27 mmol/L (ref 22–32)

## 2022-01-30 NOTE — ED Notes (Signed)
In and out cath performed using sterile technique without incident.

## 2022-01-31 LAB — URINE CULTURE
Culture: NO GROWTH
MICRO NUMBER:: 14284173
SPECIMEN QUALITY:: ADEQUATE

## 2022-02-01 ENCOUNTER — Other Ambulatory Visit: Payer: Self-pay | Admitting: Family

## 2022-02-01 MED ORDER — FLUCONAZOLE 150 MG PO TABS: 150.0000 mg | ORAL_TABLET | Freq: Once | ORAL | 0 refills | Status: AC

## 2022-02-02 ENCOUNTER — Telehealth: Payer: Self-pay | Admitting: *Deleted

## 2022-02-02 ENCOUNTER — Encounter: Payer: Self-pay | Admitting: *Deleted

## 2022-02-02 NOTE — Patient Outreach (Signed)
  Care Coordination TOC Note Transition Care Management Unsuccessful Follow-up Telephone Call  Date of discharge and from where:  01/29/22 ED Visit- [facility]; dehydration; AKI; ED EMMI Red Alert: "no scheduled follow up"  Attempts:  1st Attempt  Reason for unsuccessful TCM follow-up call:  Left voice message  Oneta Rack, RN, BSN, CCRN Alumnus RN CM Care Coordination/ Transition of [place] Management [phone]: direct office

## 2022-02-04 ENCOUNTER — Encounter: Payer: Self-pay | Admitting: *Deleted

## 2022-02-04 ENCOUNTER — Telehealth: Payer: Self-pay | Admitting: *Deleted

## 2022-02-04 NOTE — Patient Outreach (Signed)
  Care Coordination [facility] Note Transition Care Management Unsuccessful Follow-up Telephone Call  Date of discharge and from where:  01/29/22 ED Visit; [facility]; AKI/ dehydration; ED EMMI Red Alert, "No scheduled follow up"  Attempts:  2nd Attempt  Reason for unsuccessful TCM follow-up call:  Left voice message  Oneta Rack, RN, BSN, CCRN Alumnus RN CM Care Coordination/ Transition of [place] Management [phone]: direct office

## 2022-02-04 NOTE — Progress Notes (Signed)
  Care Coordination   Note   02/04/2022 Name: Angel Green MRN: [id number] DOB: [birth year]  Angel Green is a 81 y.o. year old female who sees Larose Kells, Alda Berthold, MD for primary care. I reached out to [employer] by phone today to offer care coordination services.  Angel Green was given information about Care Coordination services today including:   The Care Coordination services include support from the care team which includes your Nurse Coordinator, Clinical Social Worker, or Pharmacist.  The Care Coordination team is here to help remove barriers to the health concerns and goals most important to you. Care Coordination services are voluntary, and the patient may decline or stop services at any time by request to their care team member.   Care Coordination Consent Status: Patient did not agree to participate in care coordination services at this time.  Follow up plan:  pt husband declined services at this time - has f/u scheduled with pcp on 02/08/2022  Encounter Outcome:  Pt. Refused  Julian Hy, [place] Direct Dial: [phone]

## 2022-02-05 ENCOUNTER — Telehealth: Payer: Self-pay | Admitting: *Deleted

## 2022-02-05 ENCOUNTER — Encounter: Payer: Self-pay | Admitting: *Deleted

## 2022-02-05 NOTE — Patient Outreach (Signed)
  Care Coordination [facility] Note Transition Care Management Follow-up Telephone Call Date of discharge and from where: Friday 01/29/22 ED Visit [place]; dehydration, AKI; ED EMMI Red Alert: "no scheduled follow up" How have you been since you were released from the hospital? Per husband/ caregiver [name]: "She is doing much better and we are taking great care of her.  We help her with anything she needs.  She has an appointment with Dr. Larose Kells on Monday.  I handle her medications and don't have any questions about them.  At this time, we don't have any concerns"  Any questions or concerns? No  Items Reviewed: Did the pt receive and understand the discharge instructions provided? Yes  Medications obtained and verified? Yes  Other? No  Any new allergies since your discharge? No  Dietary orders reviewed? Yes Do you have support at home? Yes  spouse/ caregiver [name] provides assistance with care needs as indicated; spouse reports he handles all aspects of medication management  Home Care and Equipment/Supplies: Were home health services ordered? no If so, what is the name of the agency? N/A  Has the agency set up a time to come to the patient's home? not applicable Were any new equipment or medical supplies ordered?  No What is the name of the medical supply agency? N/A Were you able to get the supplies/equipment? not applicable Do you have any questions related to the use of the equipment or supplies? No  Functional Questionnaire: (I = Independent and D = Dependent) ADLs: I  spouse/ caregiver [name] provides assistance with care needs as indicated  Bathing/Dressing- I  spouse/ caregiver [name] provides assistance with care needs as indicated  Meal Prep- I  spouse/ caregiver [name] provides assistance with care needs as indicated  Eating- I  Maintaining continence- I  Transferring/Ambulation- I  spouse/ caregiver [name] provides assistance with care needs as indicated  Managing Meds- D  spouse  reports he handles all aspects of medication management  Follow up appointments reviewed:  PCP Hospital f/u appt confirmed? Yes  Scheduled to see PCP, Dr. Larose Kells on Monday 02/08/22 @ 10:20 am [facility] f/u appt confirmed? No  Scheduled to see - on - @ - Are transportation arrangements needed? No  If their condition worsens, is the pt aware to call PCP or go to the Emergency Dept.? Yes Was the patient provided with contact information for the PCP's office or ED? No- spouse declines; reports already has contact information for care providers Was to pt encouraged to call back with questions or concerns? Yes  SDOH assessments and interventions completed:   Yes SDOH Interventions Today    Flowsheet Row Most Recent Value  SDOH Interventions   Food Insecurity Interventions Intervention Not Indicated  Transportation Interventions Intervention Not Indicated  [husband provides transportation]      Care Coordination Interventions:  No Care Coordination interventions needed at this time.   Encounter Outcome:  Pt. Visit Completed    Oneta Rack, RN, BSN, CCRN Alumnus RN CM Care Coordination/ Transition of [place] Management [phone]: direct office

## 2022-02-08 ENCOUNTER — Emergency Department (HOSPITAL_COMMUNITY): Payer: Medicare Other

## 2022-02-08 ENCOUNTER — Ambulatory Visit: Payer: Medicare Other | Admitting: Internal Medicine

## 2022-02-08 ENCOUNTER — Other Ambulatory Visit: Payer: Self-pay

## 2022-02-08 ENCOUNTER — Inpatient Hospital Stay (HOSPITAL_COMMUNITY)
Admission: RE | Admit: 2022-02-08 | Discharge: 2022-02-10 | DRG: 871 | Disposition: A | Discharge status: Home Health | Payer: Medicare Other | Attending: Family Medicine | Admitting: Family Medicine

## 2022-02-08 ENCOUNTER — Encounter (HOSPITAL_COMMUNITY): Payer: Self-pay | Admitting: Family Medicine

## 2022-02-08 ENCOUNTER — Inpatient Hospital Stay (HOSPITAL_COMMUNITY): Payer: Medicare Other

## 2022-02-08 DIAGNOSIS — I739 Peripheral vascular disease, unspecified: Secondary | ICD-10-CM | POA: Diagnosis present

## 2022-02-08 DIAGNOSIS — F02C0 Dementia in other diseases classified elsewhere, severe, without behavioral disturbance, psychotic disturbance, mood disturbance, and anxiety: Secondary | ICD-10-CM | POA: Diagnosis present

## 2022-02-08 DIAGNOSIS — E1122 Type 2 diabetes mellitus with diabetic chronic kidney disease: Secondary | ICD-10-CM | POA: Diagnosis present

## 2022-02-08 DIAGNOSIS — I9589 Other hypotension: Secondary | ICD-10-CM | POA: Diagnosis not present

## 2022-02-08 DIAGNOSIS — Z9049 Acquired absence of other specified parts of digestive tract: Secondary | ICD-10-CM | POA: Diagnosis not present

## 2022-02-08 DIAGNOSIS — J189 Pneumonia, unspecified organism: Secondary | ICD-10-CM | POA: Diagnosis present

## 2022-02-08 DIAGNOSIS — F0280 Dementia in other diseases classified elsewhere without behavioral disturbance: Secondary | ICD-10-CM | POA: Diagnosis present

## 2022-02-08 DIAGNOSIS — R231 Pallor: Secondary | ICD-10-CM | POA: Diagnosis not present

## 2022-02-08 DIAGNOSIS — K567 Ileus, unspecified: Secondary | ICD-10-CM

## 2022-02-08 DIAGNOSIS — N184 Chronic kidney disease, stage 4 (severe): Secondary | ICD-10-CM | POA: Diagnosis not present

## 2022-02-08 DIAGNOSIS — K760 Fatty (change of) liver, not elsewhere classified: Secondary | ICD-10-CM | POA: Diagnosis not present

## 2022-02-08 DIAGNOSIS — K219 Gastro-esophageal reflux disease without esophagitis: Secondary | ICD-10-CM | POA: Diagnosis not present

## 2022-02-08 DIAGNOSIS — R7989 Other specified abnormal findings of blood chemistry: Secondary | ICD-10-CM | POA: Diagnosis present

## 2022-02-08 DIAGNOSIS — K56609 Unspecified intestinal obstruction, unspecified as to partial versus complete obstruction: Secondary | ICD-10-CM | POA: Diagnosis present

## 2022-02-08 DIAGNOSIS — I129 Hypertensive chronic kidney disease with stage 1 through stage 4 chronic kidney disease, or unspecified chronic kidney disease: Secondary | ICD-10-CM | POA: Diagnosis not present

## 2022-02-08 DIAGNOSIS — I517 Cardiomegaly: Secondary | ICD-10-CM | POA: Diagnosis not present

## 2022-02-08 DIAGNOSIS — A419 Sepsis, unspecified organism: Principal | ICD-10-CM | POA: Diagnosis present

## 2022-02-08 DIAGNOSIS — R0902 Hypoxemia: Secondary | ICD-10-CM | POA: Diagnosis not present

## 2022-02-08 DIAGNOSIS — E119 Type 2 diabetes mellitus without complications: Secondary | ICD-10-CM | POA: Diagnosis not present

## 2022-02-08 DIAGNOSIS — Z1152 Encounter for screening for COVID-19: Secondary | ICD-10-CM | POA: Diagnosis not present

## 2022-02-08 DIAGNOSIS — Z79899 Other long term (current) drug therapy: Secondary | ICD-10-CM

## 2022-02-08 DIAGNOSIS — Z794 Long term (current) use of insulin: Secondary | ICD-10-CM | POA: Diagnosis not present

## 2022-02-08 DIAGNOSIS — R6520 Severe sepsis without septic shock: Secondary | ICD-10-CM | POA: Diagnosis not present

## 2022-02-08 DIAGNOSIS — I2489 Other forms of acute ischemic heart disease: Secondary | ICD-10-CM | POA: Diagnosis present

## 2022-02-08 DIAGNOSIS — J90 Pleural effusion, not elsewhere classified: Secondary | ICD-10-CM | POA: Diagnosis not present

## 2022-02-08 DIAGNOSIS — Z90711 Acquired absence of uterus with remaining cervical stump: Secondary | ICD-10-CM | POA: Diagnosis not present

## 2022-02-08 DIAGNOSIS — Z6838 Body mass index (BMI) 38.0-38.9, adult: Secondary | ICD-10-CM

## 2022-02-08 DIAGNOSIS — I10 Essential (primary) hypertension: Secondary | ICD-10-CM | POA: Diagnosis not present

## 2022-02-08 DIAGNOSIS — N179 Acute kidney failure, unspecified: Secondary | ICD-10-CM | POA: Diagnosis present

## 2022-02-08 DIAGNOSIS — E872 Acidosis, unspecified: Secondary | ICD-10-CM | POA: Diagnosis present

## 2022-02-08 DIAGNOSIS — K6389 Other specified diseases of intestine: Secondary | ICD-10-CM | POA: Diagnosis not present

## 2022-02-08 DIAGNOSIS — J9811 Atelectasis: Secondary | ICD-10-CM | POA: Diagnosis not present

## 2022-02-08 DIAGNOSIS — R739 Hyperglycemia, unspecified: Secondary | ICD-10-CM | POA: Diagnosis not present

## 2022-02-08 DIAGNOSIS — E1165 Type 2 diabetes mellitus with hyperglycemia: Secondary | ICD-10-CM | POA: Diagnosis present

## 2022-02-08 DIAGNOSIS — D7389 Other diseases of spleen: Secondary | ICD-10-CM | POA: Diagnosis not present

## 2022-02-08 DIAGNOSIS — E860 Dehydration: Secondary | ICD-10-CM | POA: Diagnosis present

## 2022-02-08 DIAGNOSIS — G301 Alzheimer's disease with late onset: Secondary | ICD-10-CM | POA: Diagnosis not present

## 2022-02-08 DIAGNOSIS — E079 Disorder of thyroid, unspecified: Secondary | ICD-10-CM | POA: Diagnosis not present

## 2022-02-08 DIAGNOSIS — J181 Lobar pneumonia, unspecified organism: Secondary | ICD-10-CM | POA: Diagnosis not present

## 2022-02-08 DIAGNOSIS — E1151 Type 2 diabetes mellitus with diabetic peripheral angiopathy without gangrene: Secondary | ICD-10-CM | POA: Diagnosis present

## 2022-02-08 DIAGNOSIS — R000 Tachycardia, unspecified: Secondary | ICD-10-CM | POA: Diagnosis not present

## 2022-02-08 DIAGNOSIS — I959 Hypotension, unspecified: Secondary | ICD-10-CM | POA: Diagnosis not present

## 2022-02-08 DIAGNOSIS — R112 Nausea with vomiting, unspecified: Secondary | ICD-10-CM | POA: Diagnosis present

## 2022-02-08 DIAGNOSIS — E669 Obesity, unspecified: Secondary | ICD-10-CM | POA: Diagnosis present

## 2022-02-08 LAB — ECHOCARDIOGRAM COMPLETE
AR max vel: 2.65 cm2
AV Area VTI: 2.46 cm2
AV Area mean vel: 2.5 cm2
AV Mean grad: 1 mmHg
AV Peak grad: 2.5 mmHg
Ao pk vel: 0.79 m/s
Area-P 1/2: 4.1 cm2
Calc EF: 62.4 %
S' Lateral: 2.5 cm
Single Plane A2C EF: 53.7 %
Single Plane A4C EF: 66.1 %

## 2022-02-08 LAB — COMPREHENSIVE METABOLIC PANEL
ALT: 21 U/L (ref 0–44)
AST: 32 U/L (ref 15–41)
Albumin: 3 g/dL — ABNORMAL LOW (ref 3.5–5.0)
Alkaline Phosphatase: 83 U/L (ref 38–126)
Anion gap: 11 (ref 5–15)
BUN: 16 mg/dL (ref 8–23)
CO2: 23 mmol/L (ref 22–32)
Calcium: 9.4 mg/dL (ref 8.9–10.3)
Chloride: 103 mmol/L (ref 98–111)
Creatinine, Ser: 2.39 mg/dL — ABNORMAL HIGH (ref 0.44–1.00)
GFR, Estimated: 20 mL/min — ABNORMAL LOW (ref >60)
Glucose, Bld: 266 mg/dL — ABNORMAL HIGH (ref 70–99)
Potassium: 3.6 mmol/L (ref 3.5–5.1)
Sodium: 137 mmol/L (ref 135–145)
Total Bilirubin: 0.6 mg/dL (ref 0.3–1.2)
Total Protein: 7 g/dL (ref 6.5–8.1)

## 2022-02-08 LAB — URINALYSIS, ROUTINE W REFLEX MICROSCOPIC
Bilirubin Urine: NEGATIVE
Glucose, UA: 50 mg/dL — AB
Hgb urine dipstick: NEGATIVE
Ketones, ur: NEGATIVE mg/dL
Leukocytes,Ua: NEGATIVE
Nitrite: NEGATIVE
Protein, ur: 100 mg/dL — AB
Specific Gravity, Urine: 1.015 (ref 1.005–1.030)
pH: 6 (ref 5.0–8.0)

## 2022-02-08 LAB — CBC WITH DIFFERENTIAL/PLATELET
Abs Immature Granulocytes: 0.05 10*3/uL (ref 0.00–0.07)
Basophils Absolute: 0 10*3/uL (ref 0.0–0.1)
Basophils Relative: 0 %
Eosinophils Absolute: 0.1 10*3/uL (ref 0.0–0.5)
Eosinophils Relative: 1 %
HCT: 43.1 % (ref 36.0–46.0)
Hemoglobin: 13.6 g/dL (ref 12.0–15.0)
Immature Granulocytes: 0 %
Lymphocytes Relative: 18 %
Lymphs Abs: 2.1 10*3/uL (ref 0.7–4.0)
MCH: 31.7 pg (ref 26.0–34.0)
MCHC: 31.6 g/dL (ref 30.0–36.0)
MCV: 100.5 fL — ABNORMAL HIGH (ref 80.0–100.0)
Monocytes Absolute: 0.5 10*3/uL (ref 0.1–1.0)
Monocytes Relative: 4 %
Neutro Abs: 9 10*3/uL — ABNORMAL HIGH (ref 1.7–7.7)
Neutrophils Relative %: 77 %
Platelets: 182 10*3/uL (ref 150–400)
RBC: 4.29 MIL/uL (ref 3.87–5.11)
RDW: 14.3 % (ref 11.5–15.5)
WBC: 11.7 10*3/uL — ABNORMAL HIGH (ref 4.0–10.5)
nRBC: 0 % (ref 0.0–0.2)

## 2022-02-08 LAB — APTT: aPTT: 31 seconds (ref 24–36)

## 2022-02-08 LAB — I-STAT CHEM 8, ED
BUN: 17 mg/dL (ref 8–23)
Calcium, Ion: 1.22 mmol/L (ref 1.15–1.40)
Chloride: 102 mmol/L (ref 98–111)
Creatinine, Ser: 2.6 mg/dL — ABNORMAL HIGH (ref 0.44–1.00)
Glucose, Bld: 258 mg/dL — ABNORMAL HIGH (ref 70–99)
HCT: 42 % (ref 36.0–46.0)
Hemoglobin: 14.3 g/dL (ref 12.0–15.0)
Potassium: 4 mmol/L (ref 3.5–5.1)
Sodium: 141 mmol/L (ref 135–145)
TCO2: 27 mmol/L (ref 22–32)

## 2022-02-08 LAB — RESP PANEL BY RT-PCR (RSV, FLU A&B, COVID)  RVPGX2
Influenza A by PCR: NEGATIVE
Influenza B by PCR: NEGATIVE
Resp Syncytial Virus by PCR: NEGATIVE
SARS Coronavirus 2 by RT PCR: NEGATIVE

## 2022-02-08 LAB — PROTIME-INR
INR: 1.3 — ABNORMAL HIGH (ref 0.8–1.2)
Prothrombin Time: 16.2 seconds — ABNORMAL HIGH (ref 11.4–15.2)

## 2022-02-08 LAB — TROPONIN I (HIGH SENSITIVITY)
Troponin I (High Sensitivity): 128 ng/L (ref <18)
Troponin I (High Sensitivity): 478 ng/L (ref <18)

## 2022-02-08 LAB — LACTIC ACID, PLASMA
Lactic Acid, Venous: 3.8 mmol/L (ref 0.5–1.9)
Lactic Acid, Venous: 3.4 mmol/L (ref 0.5–1.9)

## 2022-02-08 MED ORDER — ONDANSETRON HCL 4 MG/2ML IJ SOLN: 4.0000 mg | Freq: Four times a day (QID) | INTRAMUSCULAR | Status: DC | PRN

## 2022-02-08 MED ORDER — METRONIDAZOLE 500 MG/100ML IV SOLN
500.0000 mg | Freq: Once | INTRAVENOUS | Status: AC
  Administered 2022-02-08: 500 mg via INTRAVENOUS
  Filled 2022-02-08: qty 100

## 2022-02-08 MED ORDER — SODIUM CHLORIDE 0.9% FLUSH
3.0000 mL | Freq: Two times a day (BID) | INTRAVENOUS | Status: DC
  Administered 2022-02-08 – 2022-02-10 (×5): 3 mL via INTRAVENOUS

## 2022-02-08 MED ORDER — SODIUM CHLORIDE 0.9 % IV SOLN
2.0000 g | Freq: Every day | INTRAVENOUS | Status: DC
  Administered 2022-02-09 – 2022-02-10 (×2): 2 g via INTRAVENOUS
  Filled 2022-02-08 (×2): qty 20

## 2022-02-08 MED ORDER — ATORVASTATIN CALCIUM 10 MG PO TABS
10.0000 mg | ORAL_TABLET | Freq: Every day | ORAL | Status: DC
  Administered 2022-02-08 – 2022-02-10 (×3): 10 mg via ORAL
  Filled 2022-02-08 (×3): qty 1

## 2022-02-08 MED ORDER — ONDANSETRON HCL 4 MG PO TABS: 4.0000 mg | ORAL_TABLET | Freq: Four times a day (QID) | ORAL | Status: DC | PRN

## 2022-02-08 MED ORDER — POTASSIUM CHLORIDE 10 MEQ/100ML IV SOLN
10.0000 meq | Freq: Once | INTRAVENOUS | Status: AC
  Administered 2022-02-08: 10 meq via INTRAVENOUS
  Filled 2022-02-08: qty 100

## 2022-02-08 MED ORDER — HEPARIN SODIUM (PORCINE) 5000 UNIT/ML IJ SOLN
5000.0000 [IU] | Freq: Three times a day (TID) | INTRAMUSCULAR | Status: DC
  Administered 2022-02-08 – 2022-02-10 (×6): 5000 [IU] via SUBCUTANEOUS
  Filled 2022-02-08 (×6): qty 1

## 2022-02-08 MED ORDER — SODIUM CHLORIDE 0.9 % IV SOLN: 2.0000 g | INTRAVENOUS | Status: DC

## 2022-02-08 MED ORDER — ACETAMINOPHEN 325 MG PO TABS: 650.0000 mg | ORAL_TABLET | Freq: Four times a day (QID) | ORAL | Status: DC | PRN

## 2022-02-08 MED ORDER — LACTATED RINGERS IV SOLN: INTRAVENOUS | Status: AC

## 2022-02-08 MED ORDER — LACTATED RINGERS IV SOLN: INTRAVENOUS | Status: DC

## 2022-02-08 MED ORDER — VANCOMYCIN HCL 750 MG/150ML IV SOLN: 750.0000 mg | INTRAVENOUS | Status: DC

## 2022-02-08 MED ORDER — VANCOMYCIN HCL IN DEXTROSE 1-5 GM/200ML-% IV SOLN: 1000.0000 mg | Freq: Once | INTRAVENOUS | Status: DC

## 2022-02-08 MED ORDER — LACTATED RINGERS IV BOLUS (SEPSIS)
2000.0000 mL | Freq: Once | INTRAVENOUS | Status: AC
  Administered 2022-02-08: 2000 mL via INTRAVENOUS

## 2022-02-08 MED ORDER — SODIUM CHLORIDE 0.9 % IV SOLN
2.0000 g | Freq: Once | INTRAVENOUS | Status: AC
  Administered 2022-02-08: 2 g via INTRAVENOUS
  Filled 2022-02-08: qty 12.5

## 2022-02-08 MED ORDER — ACETAMINOPHEN 650 MG RE SUPP: 650.0000 mg | Freq: Four times a day (QID) | RECTAL | Status: DC | PRN

## 2022-02-08 MED ORDER — SODIUM CHLORIDE 0.9 % IV SOLN
500.0000 mg | Freq: Every day | INTRAVENOUS | Status: DC
  Administered 2022-02-08 – 2022-02-09 (×2): 500 mg via INTRAVENOUS
  Filled 2022-02-08 (×3): qty 5

## 2022-02-08 MED ORDER — VANCOMYCIN HCL 1500 MG/300ML IV SOLN
1500.0000 mg | Freq: Once | INTRAVENOUS | Status: AC
  Administered 2022-02-08: 1500 mg via INTRAVENOUS
  Filled 2022-02-08: qty 300

## 2022-02-08 NOTE — Progress Notes (Signed)
DAY ZERO NOTE    Angel Green  GUY:[id number] DOB: Apr 20, 1940 DOA: 02/08/2022 PCP: Colon Branch, MD   Brief Narrative:  Angel Green is a 81 y.o. female with medical history significant for advanced dementia, insulin-dependent diabetes mellitus, CKD stage IV, hypertension, and PAD who presents emergency department with noisy breathing, hypertension, hyperglycemia, and confusion.    Patient admitted per HPI this morning concern for severe sepsis secondary to pneumonia with concerns for partial small bowel obstruction given intractable nausea vomiting with questionable trend patient point on CT abdomen. Patient remains without hypoxia, afebrile with notable leukocytosis.  Continue antibiotics, UA abnormal, cannot rule out concurrent UTI however this would be covered by patient's current antibiotic regimen.  Patient is having notably elevated troponin likely supply/demand mismatch in the setting of chronic kidney disease and severe sepsis and pneumonia -notably lactic acidosis downtrending appropriately with IV fluids, continue to follow clinically providing supportive care.  Incidental thyroid abnormality, recommend ultrasound in the outpatient setting in the next few weeks per PCP.    Assessment & Plan:   Principal Problem:   Sepsis due to pneumonia [facility]) Active Problems:   HTN (hypertension)   CKD (chronic kidney disease), stage IV (HCC)   Diabetes ([place])   PAD (peripheral artery disease) (HCC)   Severe late onset Alzheimer's dementia (HCC)   Elevated troponin   Nausea & vomiting   Thyroid mass   Ileus (HCC)   Severe sepsis (HCC)   DVT prophylaxis: heparin Code Status: Full Family Communication: None present  Status is: Inpatient  Dispo: The patient is from: Home              Anticipated d/c is to: To be determined              Anticipated d/c date is: 72+ hours              Patient currently not medically stable for discharge  Consultants:  None  Procedures:   None  Antimicrobials:  Azithromycin, ceftriaxone x 5 days  Subjective: No acute issues or events overnight  Objective: Vitals:   02/08/22 0315 02/08/22 0330 02/08/22 0430 02/08/22 0700  BP: (!) 147/100 (!) 123/99 118/78 121/88  Pulse: (!) 103 (!) 103 100   Resp: '15 17 16 19  '$ Temp:    97.6 F (36.4 C)  TempSrc:      SpO2: 100% 100% 100% 99%   No intake or output data in the 24 hours ending 02/08/22 0751 There were no vitals filed for this visit.  Examination:  General:  Pleasantly resting in bed, No acute distress. Lungs:  Clear to auscultate bilaterally without rhonchi, wheeze, or rales. Heart:  Regular rate and rhythm.  Without murmurs, rubs, or gallops. Abdomen:  Soft, nontender, nondistended.  Without guarding or rebound. Extremities: Without cyanosis, clubbing, edema, or obvious deformity. Vascular:  Dorsalis pedis and posterior tibial pulses palpable bilaterally. Skin:  Warm and dry, no erythema, no ulcerations.     Data Reviewed: I have personally reviewed following labs and imaging studies  CBC: Recent Labs  Lab 02/08/22 0215 02/08/22 0227  WBC 11.7*  --   NEUTROABS 9.0*  --   HGB 13.6 14.3  HCT 43.1 42.0  MCV 100.5*  --   PLT 182  --    Basic Metabolic Panel: Recent Labs  Lab 02/08/22 0215 02/08/22 0227  NA 137 141  K 3.6 4.0  CL 103 102  CO2 23  --   GLUCOSE 266* 258*  BUN  16 17  CREATININE 2.39* 2.60*  CALCIUM 9.4  --    GFR: Estimated Creatinine Clearance: 18.8 mL/min (A) (by C-G formula based on SCr of 2.6 mg/dL (H)). Liver Function Tests: Recent Labs  Lab 02/08/22 0215  AST 32  ALT 21  ALKPHOS 83  BILITOT 0.6  PROT 7.0  ALBUMIN 3.0*   No results for input(s): "LIPASE", "AMYLASE" in the last 168 hours. No results for input(s): "AMMONIA" in the last 168 hours. Coagulation Profile: Recent Labs  Lab 02/08/22 0215  INR 1.3*   Sepsis Labs: Recent Labs  Lab 02/08/22 0218 02/08/22 0500  LATICACIDVEN 3.8* 3.4*    Recent  Results (from the past 240 hour(s))  Urine Culture     Status: None   Collection Time: 01/30/22  1:40 AM   Specimen: In/Out Cath Urine  Result Value Ref Range Status   Specimen Description IN/OUT CATH URINE  Final   Special Requests NONE  Final   Culture   Final    NO GROWTH Performed at [facility], [place] [address], [place], [place] [address]    Report Status 01/31/2022 FINAL  Final    Radiology Studies: CT CHEST ABDOMEN PELVIS WO CONTRAST  Result Date: 02/08/2022 CLINICAL DATA:  Sepsis workup. EXAM: CT CHEST, ABDOMEN AND PELVIS WITHOUT CONTRAST TECHNIQUE: Multidetector CT imaging of the chest, abdomen and pelvis was performed following the standard protocol without IV contrast. RADIATION DOSE REDUCTION: This exam was performed according to the departmental dose-optimization program which includes automated exposure control, adjustment of the mA and/or kV according to patient size and/or use of iterative reconstruction technique. COMPARISON:  Portable chest today, chest radiograph 08/11/2021. FINDINGS: CT CHEST FINDINGS Cardiovascular: The heart is slightly enlarged. There is a small pericardial effusion anteriorly. There is no visible coronary calcification. There are calcific plaques in the aorta and great vessels. There is no aortic aneurysm. The pulmonary arteries and veins are normal caliber. 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 Nonemergent ultrasound follow-up is recommended. No axillary or intrathoracic adenopathy is seen without contrast. There are calcified left hilar nodes. There are few borderline size right paratracheal nodes. The thoracic trachea and thoracic esophagus are unremarkable. Lungs/Pleura: No pleural effusion, thickening or pneumothorax. There is patchy opacity in the superior segment of the left lower lobe most likely due to a small pneumonia. Mild posterior atelectasis is seen elsewhere in the lower  lobes. No other focal infiltrate is seen allowing for breathing motion. No pulmonary nodule is seen through the breathing motion. Musculoskeletal: There is thoracic spondylosis. Mild thoracic kyphodextroscoliosis. No acute or significant osseous findings. CT ABDOMEN PELVIS FINDINGS Technical note: There is a significant loss of attenuation in the posterior retroperitoneum due to the patient's arms in the field causing beam hardening artifact. This extends to involve the posterior and the deep pelvis more inferiorly. Hepatobiliary: There is additional respiratory motion further limited evaluation of the liver. The liver 17 cm length with a least mild steatosis. No mass is seen through the beam hardening and breathing motion artifacts. Pancreas: No masses seen without contrast and allowing for respiratory motion. There is questionable trace stranding at the pancreatic head versus artifact from respiratory motion. Spleen: Not well seen. There are calcified granulomas. It is grossly normal in size. Beyond that I cannot make further evaluation due to loss of image photons through the area. Adrenals/Urinary Tract: There is no adrenal mass. No focal renal abnormality is seen  through the beam hardening and respiratory motion. There is no urinary stone or obstruction. The pelvic ureters are not evaluated due to beam hardening through the posterior pelvis. The posterior bladder is also obscured by this. The anterior bladder is unremarkable. Multiple pelvic phleboliths. Stomach/Bowel: Visualized portions of the stomach are unremarkable. There is mild dilatation of the jejunum and downstream left mid to lower abdominal small bowel up to 3 cm, relative decompression in the right lower quadrant and pelvic small bowel. A discrete transitional segment could not be seen. This could be due to ileus or low-grade small bowel obstruction. There are no focal inflammatory changes. There is no fecalized small bowel segment serving as a  point of transition. The large bowel is unremarkable apart from mild rectosigmoid fecal stasis. Vascular/Lymphatic: There is patchy aortoiliac calcific plaque without AAA. No enlarged lymph nodes are seen, with limited view of the retroperitoneum the posterior pelvis. Reproductive: Status post hysterectomy. No adnexal masses. Other: There is no free air, free hemorrhage or free fluid. No incarcerated hernia. Musculoskeletal: There is severe facet hypertrophy in the lumbar spine with acquired spinal stenosis and grade 1 spondylolisthesis both at L4-5 and L5-S1, with transitional anatomy lumbosacral junction. There is osteopenia. No acute or other significant osseous findings. IMPRESSION: 1. Left lower lobe pneumonia in the superior segment. 2. Cardiomegaly with small pericardial effusion. 3. Aortic atherosclerosis. 4. 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 Nonemergent ultrasound follow-up is recommended. 5. Limited evaluation of the abdomen and pelvis due to respiratory motion and beam hardening artifact. 6. Questionable trace stranding at the pancreatic head versus artifact from respiratory motion. Correlate clinically for pancreatitis. 7. Mild dilatation of the jejunum and downstream left mid to lower abdominal small bowel up to 3 cm, with relative decompression in the right lower quadrant and pelvic small bowel. A discrete transitional segment could not be seen. This could be due to ileus or low-grade small bowel obstruction. 8. Osteopenia and degenerative change. Electronically Signed   By: Telford Nab M.D.   On: 02/08/2022 04:36   DG Chest Port 1 View  Result Date: 02/08/2022 CLINICAL DATA:  Questionable sepsis - evaluate for abnormality EXAM: PORTABLE CHEST 1 VIEW COMPARISON:  Chest x-ray 08/11/2021, chest x-ray 04/01/2014 FINDINGS: Persistent enlarged cardiac silhouette and Similar-appearing prominent thoracic aorta with some component likely due to AP portable  technique. Otherwise the heart and mediastinal contours are unchanged. No focal consolidation. No pulmonary edema. No pleural effusion. No pneumothorax. No acute osseous abnormality. IMPRESSION: Enlarged cardiac silhouette with underlying pericardial effusion not excluded. Electronically Signed   By: Iven Finn M.D.   On: 02/08/2022 02:45    Scheduled Meds:  atorvastatin  10 mg Oral Daily   heparin  5,000 Units Subcutaneous Q8H   sodium chloride flush  3 mL Intravenous Q12H   Continuous Infusions:  azithromycin     [START ON 02/09/2022] cefTRIAXone (ROCEPHIN)  IV     lactated ringers 100 mL/hr at 02/08/22 0550   potassium chloride       LOS: 0 days   Time spent: 18mn  Chaeli Judy C Kaysen Sefcik, DO [facility]  If 7PM-7AM, please contact night-coverage [email or web address]  02/08/2022, 7:51 AM

## 2022-02-08 NOTE — ED Notes (Signed)
Critical results for lactic acid and troponin. Rebekah PA notified at 315am.

## 2022-02-08 NOTE — ED Triage Notes (Signed)
Patient bib [facility] from home with concerns for sepsis. Patient was asleep and husband heard her gurgling and woke her up and found her mental status to be altered. [facility] reports A&Ox0, hypotensive, tachycardic, and a CBG of 409. Patient has a hx of dementia, diabetes, and recent staph infection.

## 2022-02-08 NOTE — ED Provider Notes (Signed)
Angel Green   CSN: [id number] Arrival date & time: 02/08/22  0203     History  Chief Complaint  Patient presents with   Code Sepsis    Angel Green is a 81 y.o. female with history of dementia who presents via EMS after her husband found her "gurgling" this evening.  Has been to be presenting to the emergency department, but at this time no further insight and the patient's course.  Was hypotensive and tachycardic with EMS.  Clammy at time of my evaluation.  Patient with history of dementia, unable to contribute to her history.  Level 5 caveat for this reason.  I have reviewed her medical records reviewed history of CKD, hypertension, GERD, PAD.  She is not on any anticoagulation per chart review.  Collateral history obtained from patient's husband and niece who are now at the bedside.  Patient appears to be at her mental status baseline, states that there was some concern for vomiting and "she was losing saliva out of her mouth" this evening prompting EMS call.  They state that she rarely eats or drinks and usually only urinates once per day.  Per patient's husband she is a FULL CODE. HPI     Home Medications Prior to Admission medications   Medication Sig Start Date End Date Taking? Authorizing Provider  amLODipine (NORVASC) 5 MG tablet Take 1 tablet (5 mg total) by mouth daily. 01/26/22   Marrian Salvage, FNP  Apoaequorin 20 MG CAPS Take 20 mg by mouth daily.    [provider]  atorvastatin (LIPITOR) 10 MG tablet Take 1 tablet (10 mg total) by mouth daily. 11/16/21   Colon Branch, MD  Blood Glucose Monitoring Suppl (FREESTYLE LITE) DEVI Use to monitor your blood sugars 12/26/17   Renato Shin, MD  Continuous Blood Gluc Sensor (FREESTYLE LIBRE 2 SENSOR) MISC 1 Device by Does not apply route every 14 (fourteen) days. 03/04/21   Renato Shin, MD  Cyanocobalamin (B-12 PO) Take 1 tablet by mouth daily. Takes 5000 mcg  by mouth daily.    [provider]  donepezil (ARICEPT) 10 MG tablet Take 1 tablet (10 mg total) by mouth at bedtime. 10/21/21   Marcial Pacas, MD  Dulaglutide 4.5 MG/0.5ML SOPN Inject 4.5 mg into the skin once a week.    [provider]  DULoxetine (CYMBALTA) 20 MG capsule Take 20 mg by mouth daily.    [provider]  EPINEPHrine 0.3 mg/0.3 mL IJ SOAJ injection Inject 0.3 mg into the muscle once. Reported on 06/30/2015    [provider]  furosemide (LASIX) 20 MG tablet Take 2 tablets (40 mg total) by mouth daily. 01/18/22   Colon Branch, MD  glucose blood (FREESTYLE LITE) test strip Use to monitor your blood sugars 4 times per day; E11.9 01/12/19   Renato Shin, MD  insulin lispro (HUMALOG KWIKPEN) 100 UNIT/ML KwikPen 5-10 units before meals Patient taking differently: 5 UNITS BID 12/23/21   Elayne Snare, MD  Insulin Lispro Prot & Lispro (HUMALOG MIX 75/25 KWIKPEN) (75-25) 100 UNIT/ML Kwikpen Inject 60 Units into the skin daily with breakfast. And pen needles 1/day Patient taking differently: Inject 50 Units into the skin daily with breakfast. And pen needles 1/day 05/07/21   Renato Shin, MD  Lancets (FREESTYLE) lancets Use to monitor blood sugars 4 times per day, once before each meal and once at bedtime 12/26/17   Renato Shin, MD  loratadine (CLARITIN) 10 MG tablet  TAKE 1 TABLET DAILY 03/30/21   Colon Branch, MD  losartan (COZAAR) 50 MG tablet Take 1 tablet (50 mg total) by mouth daily. 11/16/21   Colon Branch, MD  memantine (NAMENDA) 10 MG tablet Take 1 tablet (10 mg total) by mouth 2 (two) times daily. 10/21/21   Marcial Pacas, MD  Multiple Vitamins-Minerals (CENTRUM SILVER PO) Take 1 tablet by mouth daily.    [provider]  potassium chloride (KLOR-CON) 10 MEQ tablet Take 1 tablet (10 mEq total) by mouth daily. 12/21/21   Colon Branch, MD  QUEtiapine (SEROQUEL) 50 MG tablet Take 1 tablet (50 mg total) by mouth at bedtime. 01/12/22   Colon Branch, MD  SURE  COMFORT PEN NEEDLES 31G X 5 MM MISC Use to inject insulin 1 time per day. 09/09/20   Renato Shin, MD      Allergies    Dust mite mixed allergen ext [mite (d. farinae)]    Review of Systems   Review of Systems  Unable to perform ROS: Mental status change    Physical Exam Updated Vital Signs BP 118/78   Pulse 100   Temp 97.8 F (36.6 C) (Oral)   Resp 16   SpO2 100%  Physical Exam Vitals and nursing Green reviewed.  Constitutional:      Appearance: She is obese. She is ill-appearing. She is not toxic-appearing.  HENT:     Head: Normocephalic and atraumatic.     Nose: Nose normal.     Mouth/Throat:     Mouth: Mucous membranes are moist.     Pharynx: No oropharyngeal exudate or posterior oropharyngeal erythema.  Eyes:     General:        Right eye: No discharge.        Left eye: No discharge.     Extraocular Movements: Extraocular movements intact.     Conjunctiva/sclera: Conjunctivae normal.     Pupils: Pupils are equal, round, and reactive to light.  Cardiovascular:     Rate and Rhythm: Regular rhythm. Tachycardia present.     Pulses: Normal pulses.     Heart sounds: Normal heart sounds. No murmur heard. Pulmonary:     Effort: Pulmonary effort is normal. No tachypnea, bradypnea, accessory muscle usage, prolonged expiration or respiratory distress.     Breath sounds: Normal breath sounds. No wheezing or rales.  Chest:     Chest wall: No mass, lacerations, deformity, swelling, tenderness or crepitus.  Abdominal:     General: Bowel sounds are normal. There is no distension.     Palpations: Abdomen is soft.     Tenderness: There is generalized abdominal tenderness. There is no guarding or rebound.  Musculoskeletal:        General: No deformity.     Cervical back: Neck supple. No rigidity.     Right lower leg: No edema.     Left lower leg: No edema.  Lymphadenopathy:     Cervical: No cervical adenopathy.  Skin:    General: Skin is cool and moist.     Capillary  Refill: Capillary refill takes less than 2 seconds.     Coloration: Skin is pale.  Neurological:     General: No focal deficit present.     Mental Status: She is alert and oriented to person, place, and time. Mental status is at baseline.  Psychiatric:        Mood and Affect: Mood normal.     ED Results / Procedures / Treatments  Labs (all labs ordered are listed, but only abnormal results are displayed) Labs Reviewed  LACTIC ACID, PLASMA - Abnormal; Notable for the following components:      Result Value   Lactic Acid, Venous 3.8 (*)    All other components within normal limits  COMPREHENSIVE METABOLIC PANEL - Abnormal; Notable for the following components:   Glucose, Bld 266 (*)    Creatinine, Ser 2.39 (*)    Albumin 3.0 (*)    GFR, Estimated 20 (*)    All other components within normal limits  CBC WITH DIFFERENTIAL/PLATELET - Abnormal; Notable for the following components:   WBC 11.7 (*)    MCV 100.5 (*)    Neutro Abs 9.0 (*)    All other components within normal limits  PROTIME-INR - Abnormal; Notable for the following components:   Prothrombin Time 16.2 (*)    INR 1.3 (*)    All other components within normal limits  URINALYSIS, ROUTINE W REFLEX MICROSCOPIC - Abnormal; Notable for the following components:   Color, Urine AMBER (*)    APPearance CLOUDY (*)    Glucose, UA 50 (*)    Protein, ur 100 (*)    Bacteria, UA FEW (*)    Non Squamous Epithelial 0-5 (*)    All other components within normal limits  I-STAT CHEM 8, ED - Abnormal; Notable for the following components:   Creatinine, Ser 2.60 (*)    Glucose, Bld 258 (*)    All other components within normal limits  TROPONIN I (HIGH SENSITIVITY) - Abnormal; Notable for the following components:   Troponin I (High Sensitivity) 128 (*)    All other components within normal limits  RESP PANEL BY RT-PCR (RSV, FLU A&B, COVID)  RVPGX2  CULTURE, BLOOD (ROUTINE X 2)  CULTURE, BLOOD (ROUTINE X 2)  APTT  LACTIC ACID,  PLASMA  TROPONIN I (HIGH SENSITIVITY)    EKG EKG Interpretation  Date/Time:  Monday February 08 2022 02:23:54 EST Ventricular Rate:  108 PR Interval:  150 QRS Duration: 92 QT Interval:  328 QTC Calculation: 440 R Axis:   -14 Text Interpretation: Sinus tachycardia Left ventricular hypertrophy Borderline T abnormalities, inferior leads Confirmed by Thayer Jew [phone]) on 02/08/2022 3:34:40 AM  Radiology CT CHEST ABDOMEN PELVIS WO CONTRAST  Result Date: 02/08/2022 CLINICAL DATA:  Sepsis workup. EXAM: CT CHEST, ABDOMEN AND PELVIS WITHOUT CONTRAST TECHNIQUE: Multidetector CT imaging of the chest, abdomen and pelvis was performed following the standard protocol without IV contrast. RADIATION DOSE REDUCTION: This exam was performed according to the departmental dose-optimization program which includes automated exposure control, adjustment of the mA and/or kV according to patient size and/or use of iterative reconstruction technique. COMPARISON:  Portable chest today, chest radiograph 08/11/2021. FINDINGS: CT CHEST FINDINGS Cardiovascular: The heart is slightly enlarged. There is a small pericardial effusion anteriorly. There is no visible coronary calcification. There are calcific plaques in the aorta and great vessels. There is no aortic aneurysm. The pulmonary arteries and veins are normal caliber. 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 Nonemergent ultrasound follow-up is recommended. No axillary or intrathoracic adenopathy is seen without contrast. There are calcified left hilar nodes. There are few borderline size right paratracheal nodes. The thoracic trachea and thoracic esophagus are unremarkable. Lungs/Pleura: No pleural effusion, thickening or pneumothorax. There is patchy opacity in the superior segment of the left lower lobe most likely due to a small pneumonia. Mild posterior atelectasis is seen  elsewhere in the lower  lobes. No other focal infiltrate is seen allowing for breathing motion. No pulmonary nodule is seen through the breathing motion. Musculoskeletal: There is thoracic spondylosis. Mild thoracic kyphodextroscoliosis. No acute or significant osseous findings. CT ABDOMEN PELVIS FINDINGS Technical Green: There is a significant loss of attenuation in the posterior retroperitoneum due to the patient's arms in the field causing beam hardening artifact. This extends to involve the posterior and the deep pelvis more inferiorly. Hepatobiliary: There is additional respiratory motion further limited evaluation of the liver. The liver 17 cm length with a least mild steatosis. No mass is seen through the beam hardening and breathing motion artifacts. Pancreas: No masses seen without contrast and allowing for respiratory motion. There is questionable trace stranding at the pancreatic head versus artifact from respiratory motion. Spleen: Not well seen. There are calcified granulomas. It is grossly normal in size. Beyond that I cannot make further evaluation due to loss of image photons through the area. Adrenals/Urinary Tract: There is no adrenal mass. No focal renal abnormality is seen through the beam hardening and respiratory motion. There is no urinary stone or obstruction. The pelvic ureters are not evaluated due to beam hardening through the posterior pelvis. The posterior bladder is also obscured by this. The anterior bladder is unremarkable. Multiple pelvic phleboliths. Stomach/Bowel: Visualized portions of the stomach are unremarkable. There is mild dilatation of the jejunum and downstream left mid to lower abdominal small bowel up to 3 cm, relative decompression in the right lower quadrant and pelvic small bowel. A discrete transitional segment could not be seen. This could be due to ileus or low-grade small bowel obstruction. There are no focal inflammatory changes. There is no fecalized small bowel segment serving as a  point of transition. The large bowel is unremarkable apart from mild rectosigmoid fecal stasis. Vascular/Lymphatic: There is patchy aortoiliac calcific plaque without AAA. No enlarged lymph nodes are seen, with limited view of the retroperitoneum the posterior pelvis. Reproductive: Status post hysterectomy. No adnexal masses. Other: There is no free air, free hemorrhage or free fluid. No incarcerated hernia. Musculoskeletal: There is severe facet hypertrophy in the lumbar spine with acquired spinal stenosis and grade 1 spondylolisthesis both at L4-5 and L5-S1, with transitional anatomy lumbosacral junction. There is osteopenia. No acute or other significant osseous findings. IMPRESSION: 1. Left lower lobe pneumonia in the superior segment. 2. Cardiomegaly with small pericardial effusion. 3. Aortic atherosclerosis. 4. 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 Nonemergent ultrasound follow-up is recommended. 5. Limited evaluation of the abdomen and pelvis due to respiratory motion and beam hardening artifact. 6. Questionable trace stranding at the pancreatic head versus artifact from respiratory motion. Correlate clinically for pancreatitis. 7. Mild dilatation of the jejunum and downstream left mid to lower abdominal small bowel up to 3 cm, with relative decompression in the right lower quadrant and pelvic small bowel. A discrete transitional segment could not be seen. This could be due to ileus or low-grade small bowel obstruction. 8. Osteopenia and degenerative change. Electronically Signed   By: Telford Nab M.D.   On: 02/08/2022 04:36   DG Chest Port 1 View  Result Date: 02/08/2022 CLINICAL DATA:  Questionable sepsis - evaluate for abnormality EXAM: PORTABLE CHEST 1 VIEW COMPARISON:  Chest x-ray 08/11/2021, chest x-ray 04/01/2014 FINDINGS: Persistent enlarged cardiac silhouette and Similar-appearing prominent thoracic aorta with some component likely due to AP portable  technique. Otherwise the heart and mediastinal contours are unchanged.  No focal consolidation. No pulmonary edema. No pleural effusion. No pneumothorax. No acute osseous abnormality. IMPRESSION: Enlarged cardiac silhouette with underlying pericardial effusion not excluded. Electronically Signed   By: Iven Finn M.D.   On: 02/08/2022 02:45    Procedures .Critical Care  Performed by: Emeline Darling, PA-C Authorized by: Emeline Darling, PA-C   Critical care provider statement:    Critical care time (minutes):  45   Critical care was time spent personally by me on the following activities:  Development of treatment plan with patient or surrogate, discussions with consultants, evaluation of patient's response to treatment, examination of patient, obtaining history from patient or surrogate, ordering and performing treatments and interventions, ordering and review of laboratory studies, ordering and review of radiographic studies, pulse oximetry and re-evaluation of patient's condition     Medications Ordered in ED Medications  lactated ringers infusion ( Intravenous New Bag/Given 02/08/22 0446)  vancomycin (VANCOREADY) IVPB 1500 mg/300 mL (1,500 mg Intravenous New Bag/Given 02/08/22 0447)  ceFEPIme (MAXIPIME) 2 g in sodium chloride 0.9 % 100 mL IVPB (has no administration in time range)  vancomycin (VANCOREADY) IVPB 750 mg/150 mL (has no administration in time range)  lactated ringers bolus 2,000 mL (0 mLs Intravenous Stopped 02/08/22 0410)  ceFEPIme (MAXIPIME) 2 g in sodium chloride 0.9 % 100 mL IVPB (0 g Intravenous Stopped 02/08/22 0340)  metroNIDAZOLE (FLAGYL) IVPB 500 mg (0 mg Intravenous Stopped 02/08/22 0410)    ED Course/ Medical Decision Making/ A&P Clinical Course as of 02/08/22 0515  Mon Feb 08, 2022  0248 Platelets: 182 [RS]  0506 Consult to Dr. Myna Hidalgo, hospitalist, who is agreeable to admitting this patient to his service.  [RS]    Clinical Course User  Index [RS] Emilio Baylock, Gypsy Balsam, PA-C                           Medical Decision Making 81 year old female who presents with concern for sepsis VS.   Tachycardic, hypotensive with SBP in the 80s at time of arrival. Abdomen soft, nondistended, TTP on the left side. Lungs CTAB. No wounds visualized.  The differential diagnosis for AMS is extensive and includes, but is not limited to:  Drug overdose - opioids, alcohol, sedatives, antipsychotics, drug withdrawal, others Metabolic: hypoxia, hypoglycemia, hyperglycemia, hypercalcemia, hypernatremia, hyponatremia, uremia, hepatic encephalopathy, hypothyroidism, hyperthyroidism, vitamin B12 or thiamine deficiency, carbon monoxide poisoning, Wilson's disease, Lactic acidosis, DKA/HHOS Infectious: meningitis, encephalitis, bacteremia/sepsis, urinary tract infection, pneumonia, neurosyphilis Structural: Space-occupying lesion, (brain tumor, subdural hematoma, hydrocephalus,) Vascular: stroke, subarachnoid hemorrhage, coronary ischemia, hypertensive encephalopathy, CNS vasculitis, thrombotic thrombocytopenic purpura, disseminated intravascular coagulation, hyperviscosity Psychiatric: Schizophrenia, depression; Other: Seizure, hypothermia, heat stroke, ICU psychosis, dementia -"sundowning."     Amount and/or Complexity of Data Reviewed Labs: ordered. Decision-making details documented in ED Course.    Details:  CBC with leukocytosis of 11,000 mild no anemia, CMP with CR of 2.39, near patient's baseline of 2.2. Lactic 3.8, Troponin 128. UA wiht proteinuria, hyaline casts, few bacteria. Not convincing for infection. Visualized by this provider, appeared very concentrated.  Radiology: ordered.    Details: DG chest with enlarged cardiac silhouette, cannot rule out pericardial effusion. Visualized by this provider.  CT chest abdomen pelvis with left lower lobe pneumonia, cardiomegaly with small pericardial effusion, question trace stranding in the pancreatic  head versus respiratory motion artifact, patient not tender in her epigastrium, changes in the left lower to mid abdominal small bowel concerning for possible ileus versus  low-grade small bowel obstruction. ECG/medicine tests: ordered.  Risk Prescription drug management. Decision regarding hospitalization.    Patient will require admission to the hospital for sepsis likely secondary to identified LLL pneumonia, as well as ?early small bowel obstruction. Incidental finding of pericardial effusion which will likely require further evaluation in the inpatient setting.   Consult to hospitalist as above.   Angel Green's husband and niece  voiced understanding of her medical evaluation and treatment plan. Each of their questions answered to their expressed satisfaction. They are amenable to plan for admission at this time.   This chart was dictated using voice recognition software, Dragon. Despite the best efforts of this provider to proofread and correct errors, errors may still occur which can change documentation meaning.  Final Clinical Impression(s) / ED Diagnoses Final diagnoses:  Sepsis without acute organ dysfunction, due to unspecified organism [facility])    Rx / DC Orders ED Discharge Orders     None         Emeline Darling, PA-C 02/08/22 0515    Merryl Hacker, MD 02/09/22 [phone]

## 2022-02-08 NOTE — Progress Notes (Signed)
  Echocardiogram 2D Echocardiogram has been performed.  Angel Green 02/08/2022, 5:19 PM

## 2022-02-08 NOTE — H&P (Signed)
History and Physical    Imo Cumbie VPX:[id number] DOB: 1940-12-24 DOA: 02/08/2022  PCP: Colon Branch, MD   Patient coming from: Home   Chief Complaint: Noisy breathing, N/V, hypotension, clammy   HPI: Angel Green is a 81 y.o. female with medical history significant for advanced dementia, insulin-dependent diabetes mellitus, CKD stage IV, hypertension, and PAD who presents emergency department with noisy breathing, hypertension, hyperglycemia, and confusion.   Patient began refusing to eat 4 or 5 days ago, developed a cough, but otherwise seemed to be well and was not complaining of anything (though her husband notes that she has difficulty expressing her needs due to dementia).  Last night, when the patient went to bed, her husband noted that she had noisy breathing and he was having difficulty waking her.  She was not responding to him and he called EMS.  She was found by EMS to be hypotensive, tachycardic, disoriented, and with CBG of 409.  She was noted to have vomit on her shirt.  ED Course: Upon arrival to the ED, patient is found to be afebrile and saturating mid 90s on room air with slightly elevated heart rate and systolic blood pressure of 83 and greater.  EKG demonstrates sinus tachycardia with LVH.  CT of the chest, abdomen, and pelvis is notable for left lower lobe pneumonia, right lobe thyroid mass, likely ileus or low-grade SBO, nephrolithiasis or renal obstruction.  Blood work was notable for glucose 266, creatinine 2.39, WBC 11.7, lactic acid 3.8, INR 1.3, and troponin 128.  Blood cultures were collected in the ED and the patient was given 2 L of LR, cefepime, vancomycin, and Flagyl.  Review of Systems:  ROS limited by patient's clinical condition.  Past Medical History:  Diagnosis Date   Allergy    Dust Mites   Ankle pain    chronic   Diabetes mellitus, type 2 ([place])    Hyperlipidemia    Hypertension    Memory loss    Osteoarthritis    RENAL INSUFFICIENCY,  CHRONIC 04/16/2010   Rhinitis    allergic nos    Past Surgical History:  Procedure Laterality Date   CATARACT EXTRACTION     CHOLECYSTECTOMY     dental implants     PARTIAL HYSTERECTOMY     in her 52s    Social History:   reports that she has never smoked. She has never used smokeless tobacco. She reports that she does not drink alcohol and does not use drugs.  Allergies  Allergen Reactions   Dust Mite Mixed Allergen Ext [Mite (D. Farinae)]     Family History  Problem Relation Age of Onset   Healthy Brother    Healthy Son    Healthy Son    Other Mother        unsure of history - "old age"   Other Father        unsure of history - "old age"   Heart attack Neg Hx    Colon cancer Neg Hx    Breast cancer Neg Hx    Stroke Neg Hx      Prior to Admission medications   Medication Sig Start Date End Date Taking? Authorizing Provider  amLODipine (NORVASC) 5 MG tablet Take 1 tablet (5 mg total) by mouth daily. 01/26/22   Marrian Salvage, FNP  Apoaequorin 20 MG CAPS Take 20 mg by mouth daily.    [provider]  atorvastatin (LIPITOR) 10 MG tablet Take 1 tablet (10 mg total)  by mouth daily. 11/16/21   Colon Branch, MD  Blood Glucose Monitoring Suppl (FREESTYLE LITE) DEVI Use to monitor your blood sugars 12/26/17   Renato Shin, MD  Continuous Blood Gluc Sensor (FREESTYLE LIBRE 2 SENSOR) MISC 1 Device by Does not apply route every 14 (fourteen) days. 03/04/21   Renato Shin, MD  Cyanocobalamin (B-12 PO) Take 1 tablet by mouth daily. Takes 5000 mcg by mouth daily.    [provider]  donepezil (ARICEPT) 10 MG tablet Take 1 tablet (10 mg total) by mouth at bedtime. 10/21/21   Marcial Pacas, MD  Dulaglutide 4.5 MG/0.5ML SOPN Inject 4.5 mg into the skin once a week.    [provider]  DULoxetine (CYMBALTA) 20 MG capsule Take 20 mg by mouth daily.    [provider]  EPINEPHrine 0.3 mg/0.3 mL IJ SOAJ injection Inject 0.3 mg into the muscle once.  Reported on 06/30/2015    [provider]  furosemide (LASIX) 20 MG tablet Take 2 tablets (40 mg total) by mouth daily. 01/18/22   Colon Branch, MD  glucose blood (FREESTYLE LITE) test strip Use to monitor your blood sugars 4 times per day; E11.9 01/12/19   Renato Shin, MD  insulin lispro (HUMALOG KWIKPEN) 100 UNIT/ML KwikPen 5-10 units before meals Patient taking differently: 5 UNITS BID 12/23/21   Elayne Snare, MD  Insulin Lispro Prot & Lispro (HUMALOG MIX 75/25 KWIKPEN) (75-25) 100 UNIT/ML Kwikpen Inject 60 Units into the skin daily with breakfast. And pen needles 1/day Patient taking differently: Inject 50 Units into the skin daily with breakfast. And pen needles 1/day 05/07/21   Renato Shin, MD  Lancets (FREESTYLE) lancets Use to monitor blood sugars 4 times per day, once before each meal and once at bedtime 12/26/17   Renato Shin, MD  loratadine (CLARITIN) 10 MG tablet TAKE 1 TABLET DAILY 03/30/21   Colon Branch, MD  losartan (COZAAR) 50 MG tablet Take 1 tablet (50 mg total) by mouth daily. 11/16/21   Colon Branch, MD  memantine (NAMENDA) 10 MG tablet Take 1 tablet (10 mg total) by mouth 2 (two) times daily. 10/21/21   Marcial Pacas, MD  Multiple Vitamins-Minerals (CENTRUM SILVER PO) Take 1 tablet by mouth daily.    [provider]  potassium chloride (KLOR-CON) 10 MEQ tablet Take 1 tablet (10 mEq total) by mouth daily. 12/21/21   Colon Branch, MD  QUEtiapine (SEROQUEL) 50 MG tablet Take 1 tablet (50 mg total) by mouth at bedtime. 01/12/22   Colon Branch, MD  SURE COMFORT PEN NEEDLES 31G X 5 MM MISC Use to inject insulin 1 time per day. 09/09/20   Renato Shin, MD    Physical Exam: Vitals:   02/08/22 0300 02/08/22 0315 02/08/22 0330 02/08/22 0430  BP: 115/85 (!) 147/100 (!) 123/99 118/78  Pulse: (!) 104 (!) 103 (!) 103 100  Resp: '16 15 17 16  '$ Temp:      TempSrc:      SpO2: 100% 100% 100% 100%    Constitutional: NAD, no pallor or diaphoresis   Eyes: PERTLA, lids and  conjunctivae normal ENMT: Mucous membranes are moist. Posterior pharynx clear of any exudate or lesions.   Neck: supple, no masses  Respiratory: Coarse rales on left, no wheezing. No accessory muscle use.  Cardiovascular: S1 & S2 heard, regular rate and rhythm. No extremity edema.  Abdomen: Soft, no rebound pain or guarding. Bowel sounds hypoactive.  Musculoskeletal: no clubbing / cyanosis. No joint deformity  upper and lower extremities.   Skin: no significant rashes, lesions, ulcers. Warm, dry, well-perfused. Neurologic: CN 2-12 grossly intact. Moving all extremities. Alert and oriented x1.  Psychiatric: Calm. Cooperative.    Labs and Imaging on Admission: I have personally reviewed following labs and imaging studies  CBC: Recent Labs  Lab 02/08/22 0215 02/08/22 0227  WBC 11.7*  --   NEUTROABS 9.0*  --   HGB 13.6 14.3  HCT 43.1 42.0  MCV 100.5*  --   PLT 182  --    Basic Metabolic Panel: Recent Labs  Lab 02/08/22 0215 02/08/22 0227  NA 137 141  K 3.6 4.0  CL 103 102  CO2 23  --   GLUCOSE 266* 258*  BUN 16 17  CREATININE 2.39* 2.60*  CALCIUM 9.4  --    GFR: Estimated Creatinine Clearance: 18.8 mL/min (A) (by C-G formula based on SCr of 2.6 mg/dL (H)). Liver Function Tests: Recent Labs  Lab 02/08/22 0215  AST 32  ALT 21  ALKPHOS 83  BILITOT 0.6  PROT 7.0  ALBUMIN 3.0*   No results for input(s): "LIPASE", "AMYLASE" in the last 168 hours. No results for input(s): "AMMONIA" in the last 168 hours. Coagulation Profile: Recent Labs  Lab 02/08/22 0215  INR 1.3*   Cardiac Enzymes: No results for input(s): "CKTOTAL", "CKMB", "CKMBINDEX", "TROPONINI" in the last 168 hours. BNP (last 3 results) No results for input(s): "PROBNP" in the last 8760 hours. HbA1C: No results for input(s): "HGBA1C" in the last 72 hours. CBG: No results for input(s): "GLUCAP" in the last 168 hours. Lipid Profile: No results for input(s): "CHOL", "HDL", "LDLCALC", "TRIG", "CHOLHDL",  "LDLDIRECT" in the last 72 hours. Thyroid Function Tests: No results for input(s): "TSH", "T4TOTAL", "FREET4", "T3FREE", "THYROIDAB" in the last 72 hours. Anemia Panel: No results for input(s): "VITAMINB12", "FOLATE", "FERRITIN", "TIBC", "IRON", "RETICCTPCT" in the last 72 hours. Urine analysis:    Component Value Date/Time   COLORURINE AMBER (A) 02/08/2022 0218   APPEARANCEUR CLOUDY (A) 02/08/2022 0218   LABSPEC 1.015 02/08/2022 0218   PHURINE 6.0 02/08/2022 0218   GLUCOSEU 50 (A) 02/08/2022 0218   GLUCOSEU NEGATIVE 07/01/2021 1548   HGBUR NEGATIVE 02/08/2022 0218   HGBUR negative 10/11/2008 1035   BILIRUBINUR NEGATIVE 02/08/2022 0218   BILIRUBINUR n 05/20/2010 1506   KETONESUR NEGATIVE 02/08/2022 0218   PROTEINUR 100 (A) 02/08/2022 0218   UROBILINOGEN 0.2 07/01/2021 1548   NITRITE NEGATIVE 02/08/2022 0218   LEUKOCYTESUR NEGATIVE 02/08/2022 0218   Sepsis Labs: '@LABRCNTIP'$ (procalcitonin:4,lacticidven:4) ) Recent Results (from the past 240 hour(s))  Urine Culture     Status: None   Collection Time: 01/30/22  1:40 AM   Specimen: In/Out Cath Urine  Result Value Ref Range Status   Specimen Description IN/OUT CATH URINE  Final   Special Requests NONE  Final   Culture   Final    NO GROWTH Performed at [facility], [place] [address] [place], [place] [address]    Report Status 01/31/2022 FINAL  Final     Radiological Exams on Admission: CT CHEST ABDOMEN PELVIS WO CONTRAST  Result Date: 02/08/2022 CLINICAL DATA:  Sepsis workup. EXAM: CT CHEST, ABDOMEN AND PELVIS WITHOUT CONTRAST TECHNIQUE: Multidetector CT imaging of the chest, abdomen and pelvis was performed following the standard protocol without IV contrast. RADIATION DOSE REDUCTION: This exam was performed according to the departmental dose-optimization program which includes automated exposure control, adjustment of the mA and/or kV according to patient size and/or use of iterative reconstruction  technique. COMPARISON:   Portable chest today, chest radiograph 08/11/2021. FINDINGS: CT CHEST FINDINGS Cardiovascular: The heart is slightly enlarged. There is a small pericardial effusion anteriorly. There is no visible coronary calcification. There are calcific plaques in the aorta and great vessels. There is no aortic aneurysm. The pulmonary arteries and veins are normal caliber. 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 Nonemergent ultrasound follow-up is recommended. No axillary or intrathoracic adenopathy is seen without contrast. There are calcified left hilar nodes. There are few borderline size right paratracheal nodes. The thoracic trachea and thoracic esophagus are unremarkable. Lungs/Pleura: No pleural effusion, thickening or pneumothorax. There is patchy opacity in the superior segment of the left lower lobe most likely due to a small pneumonia. Mild posterior atelectasis is seen elsewhere in the lower lobes. No other focal infiltrate is seen allowing for breathing motion. No pulmonary nodule is seen through the breathing motion. Musculoskeletal: There is thoracic spondylosis. Mild thoracic kyphodextroscoliosis. No acute or significant osseous findings. CT ABDOMEN PELVIS FINDINGS Technical note: There is a significant loss of attenuation in the posterior retroperitoneum due to the patient's arms in the field causing beam hardening artifact. This extends to involve the posterior and the deep pelvis more inferiorly. Hepatobiliary: There is additional respiratory motion further limited evaluation of the liver. The liver 17 cm length with a least mild steatosis. No mass is seen through the beam hardening and breathing motion artifacts. Pancreas: No masses seen without contrast and allowing for respiratory motion. There is questionable trace stranding at the pancreatic head versus artifact from respiratory motion. Spleen: Not well seen. There are calcified granulomas. It  is grossly normal in size. Beyond that I cannot make further evaluation due to loss of image photons through the area. Adrenals/Urinary Tract: There is no adrenal mass. No focal renal abnormality is seen through the beam hardening and respiratory motion. There is no urinary stone or obstruction. The pelvic ureters are not evaluated due to beam hardening through the posterior pelvis. The posterior bladder is also obscured by this. The anterior bladder is unremarkable. Multiple pelvic phleboliths. Stomach/Bowel: Visualized portions of the stomach are unremarkable. There is mild dilatation of the jejunum and downstream left mid to lower abdominal small bowel up to 3 cm, relative decompression in the right lower quadrant and pelvic small bowel. A discrete transitional segment could not be seen. This could be due to ileus or low-grade small bowel obstruction. There are no focal inflammatory changes. There is no fecalized small bowel segment serving as a point of transition. The large bowel is unremarkable apart from mild rectosigmoid fecal stasis. Vascular/Lymphatic: There is patchy aortoiliac calcific plaque without AAA. No enlarged lymph nodes are seen, with limited view of the retroperitoneum the posterior pelvis. Reproductive: Status post hysterectomy. No adnexal masses. Other: There is no free air, free hemorrhage or free fluid. No incarcerated hernia. Musculoskeletal: There is severe facet hypertrophy in the lumbar spine with acquired spinal stenosis and grade 1 spondylolisthesis both at L4-5 and L5-S1, with transitional anatomy lumbosacral junction. There is osteopenia. No acute or other significant osseous findings. IMPRESSION: 1. Left lower lobe pneumonia in the superior segment. 2. Cardiomegaly with small pericardial effusion. 3. Aortic atherosclerosis. 4. 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 Nonemergent ultrasound follow-up is recommended. 5. Limited evaluation of the  abdomen and pelvis due to respiratory motion and beam hardening artifact. 6. Questionable trace stranding at  the pancreatic head versus artifact from respiratory motion. Correlate clinically for pancreatitis. 7. Mild dilatation of the jejunum and downstream left mid to lower abdominal small bowel up to 3 cm, with relative decompression in the right lower quadrant and pelvic small bowel. A discrete transitional segment could not be seen. This could be due to ileus or low-grade small bowel obstruction. 8. Osteopenia and degenerative change. Electronically Signed   By: Telford Nab M.D.   On: 02/08/2022 04:36   DG Chest Port 1 View  Result Date: 02/08/2022 CLINICAL DATA:  Questionable sepsis - evaluate for abnormality EXAM: PORTABLE CHEST 1 VIEW COMPARISON:  Chest x-ray 08/11/2021, chest x-ray 04/01/2014 FINDINGS: Persistent enlarged cardiac silhouette and Similar-appearing prominent thoracic aorta with some component likely due to AP portable technique. Otherwise the heart and mediastinal contours are unchanged. No focal consolidation. No pulmonary edema. No pleural effusion. No pneumothorax. No acute osseous abnormality. IMPRESSION: Enlarged cardiac silhouette with underlying pericardial effusion not excluded. Electronically Signed   By: Iven Finn M.D.   On: 02/08/2022 02:45    EKG: Independently reviewed. Sinus tachycardia, rate 108, LVH.   Assessment/Plan   1. Severe sepsis secondary to PNA  - Blood cultures collected in ED, 2 liter LR bolus was given, and she was started on broad-spectrum abx   - BP normalized with IVF and mental status returned to baseline per husband  - Check strep pneumo and legionella antigens, treat with Rocephin and azithromycin, trend procalcitonin and lactate, follow cultures and clinical course    2. ?ileus or early SBO  - Pt had N/V just prior to arrival in ED and imaging concerning for ileus or low-grade SBO  - She has not vomited since arrival in ED; exam is  benign - Suspect this is ileus from sepsis   - Keep NPO for now, use antiemetics as needed, monitor/optimize electrolytes, treat underlying sepsis, advance diet as she improves   3. Elevated troponin  - Troponin 128 in ED  - Likely supply-demand mismatch in setting of severe sepsis rather than ACS  - Continue cardiac monitoring, trend troponin, check echocardiogram   4. CKD IV  - SCr is 2.39 on admission, similar to recent priors abut was 1.42 in October 2023  - Renally-dose medications, avoid hypotension, monitor    5. Dementia  - Delirium precautions   6. PAD  - No acute ischemia  - Continue statin    7. Hypertension  - Hypotensive in ED initially and antihypertensive held on admission   8. Thyroid mass  - Noted incidentally on CT in ED, outpatient follow-up recommended for US   DVT prophylaxis: sq heparin  Code Status: full  Level of Care: Level of care: Progressive Family Communication: Husband at bedside  Disposition Plan:  Patient is from: home  Anticipated d/c is to: TBD Anticipated d/c date is: 02/11/22  Patient currently: Pending improvement in sepsis parameters  Consults called none  Admission status: Inpatient     Vianne Bulls, MD [facility]  02/08/2022, 5:19 AM

## 2022-02-08 NOTE — Sepsis Progress Note (Signed)
[facility] following for Sepsis Protocol 

## 2022-02-08 NOTE — Progress Notes (Signed)
Pharmacy Antibiotic Note  Olivea Shira is a 81 y.o. female admitted on 02/08/2022 with pneumonia.  Pharmacy has been consulted for Vancomycin/Zosyn dosing. WBC mildly elevated. Noted renal dysfunction.   Plan: Vancomycin 750 mg IV q48h >>>Estimated AUC: 464 Cefepime 2g IV q24h Trend WBC, temp, renal function  F/U infectious work-up Drug levels as indicated   Temp (24hrs), Avg:97.8 F (36.6 C), Min:97.8 F (36.6 C), Max:97.8 F (36.6 C)  Recent Labs  Lab 02/08/22 0215 02/08/22 0218 02/08/22 0227  WBC 11.7*  --   --   CREATININE 2.39*  --  2.60*  LATICACIDVEN  --  3.8*  --     Estimated Creatinine Clearance: 18.8 mL/min (A) (by C-G formula based on SCr of 2.6 mg/dL (H)).    Allergies  Allergen Reactions   Dust Mite Mixed Allergen Ext [Mite (D. Farinae)]     Narda Bonds, PharmD, BCPS Clinical Pharmacist Phone: [phone]

## 2022-02-09 DIAGNOSIS — J189 Pneumonia, unspecified organism: Secondary | ICD-10-CM | POA: Diagnosis not present

## 2022-02-09 DIAGNOSIS — A419 Sepsis, unspecified organism: Secondary | ICD-10-CM | POA: Diagnosis not present

## 2022-02-09 LAB — GLUCOSE, CAPILLARY
Glucose-Capillary: 146 mg/dL — ABNORMAL HIGH (ref 70–99)
Glucose-Capillary: 159 mg/dL — ABNORMAL HIGH (ref 70–99)
Glucose-Capillary: 174 mg/dL — ABNORMAL HIGH (ref 70–99)
Glucose-Capillary: 204 mg/dL — ABNORMAL HIGH (ref 70–99)
Glucose-Capillary: 168 mg/dL — ABNORMAL HIGH (ref 70–99)

## 2022-02-09 LAB — CBC
HCT: 44.3 % (ref 36.0–46.0)
Hemoglobin: 14.4 g/dL (ref 12.0–15.0)
MCH: 31.8 pg (ref 26.0–34.0)
MCHC: 32.5 g/dL (ref 30.0–36.0)
MCV: 97.8 fL (ref 80.0–100.0)
Platelets: 152 10*3/uL (ref 150–400)
RBC: 4.53 MIL/uL (ref 3.87–5.11)
RDW: 14.4 % (ref 11.5–15.5)
WBC: 8.5 10*3/uL (ref 4.0–10.5)
nRBC: 0 % (ref 0.0–0.2)

## 2022-02-09 LAB — BASIC METABOLIC PANEL
Anion gap: 12 (ref 5–15)
BUN: 13 mg/dL (ref 8–23)
CO2: 22 mmol/L (ref 22–32)
Calcium: 9.3 mg/dL (ref 8.9–10.3)
Chloride: 106 mmol/L (ref 98–111)
Creatinine, Ser: 1.75 mg/dL — ABNORMAL HIGH (ref 0.44–1.00)
GFR, Estimated: 29 mL/min — ABNORMAL LOW (ref >60)
Glucose, Bld: 155 mg/dL — ABNORMAL HIGH (ref 70–99)
Potassium: 3.8 mmol/L (ref 3.5–5.1)
Sodium: 140 mmol/L (ref 135–145)

## 2022-02-09 LAB — STREP PNEUMONIAE URINARY ANTIGEN: Strep Pneumo Urinary Antigen: NEGATIVE

## 2022-02-09 LAB — MAGNESIUM: Magnesium: 2.1 mg/dL (ref 1.7–2.4)

## 2022-02-09 MED ORDER — LACTATED RINGERS IV SOLN: Freq: Once | INTRAVENOUS | Status: AC

## 2022-02-09 NOTE — Progress Notes (Signed)
PROGRESS NOTE    Angel Green  WVP:[id number] DOB: 05-26-1940 DOA: 02/08/2022 PCP: Colon Branch, MD   Brief Narrative:  Angel Green is a 81 y.o. female with medical history significant for advanced dementia, insulin-dependent diabetes mellitus, CKD stage IV, hypertension, and PAD who presents emergency department with noisy breathing, hypertension, hyperglycemia, and confusion.  Admitted for questionable pneumonia.  Assessment & Plan:   Principal Problem:   Sepsis due to pneumonia [facility]) Active Problems:   HTN (hypertension)   CKD (chronic kidney disease), stage IV (HCC)   Diabetes ([place])   PAD (peripheral artery disease) (HCC)   Severe late onset Alzheimer's dementia (HCC)   Elevated troponin   Nausea & vomiting   Thyroid mass   Ileus (HCC)   Severe sepsis (HCC)  Severe sepsis secondary to PNA  -Cultures remain negative, COVID flu RSV swab negative -Questionably commune acquired pneumonia, continue broad-spectrum antibiotics with azithromycin and ceftriaxone x 5 days -Patient toxic no indication at this time for inhalers, steroid, or oxygen -Symptoms improving with IV fluids and antibiotics -likely discharge in the next 24 to 48 hours pending clinical course   Ileus vs early SBO resolved -Patient and family indicates intractable nausea vomiting and questionable diarrhea prior to admission -Imaging at intake concerning for ileus versus low-grade small bowel obstruction -No further episodes of nausea or vomiting, tolerating p.o. quite well, advance diet as tolerated   Elevated troponin  -Troponin trending upwards in the setting of severe sepsis -Patient remains without EKG changes or symptoms consistent with ACS, will continue to follow clinically -No indication for further testing imaging or consult at this time -Echo show EF 6065% normal function with indeterminate diastolic function   AKI on CKD IV resolving - SCr is 2.39 on admission, similar to recent priors abut  was 1.42 in October 2023  -Creatinine downtrending appropriately with IV fluids and increase p.o. intake   Dementia  - Delirium precautions ongoing, baseline appears to be alert oriented to person and general situation, able to perform ADLs -Per husband at bedside today she appears to be back to baseline   PAD, chronic - No acute ischemia or cause for further imaging or intervention- Continue statin     Hypertension, essential, chronic -Notably hypotensive in ED initially and antihypertensive held on admission -will likely discontinue at discharge given patient's stable blood pressure over the last 48 hours   Thyroid mass  - Noted incidentally on CT in ED, outpatient follow-up recommended for US   DVT prophylaxis: Heparin Code Status: Full Family Communication: Husband at bedside  Status is: Inpatient  Dispo: The patient is from: Home              Anticipated d/c is to: Home              Anticipated d/c date is: 24 to 48 hours              Patient currently not medically stable for discharge  Consultants:  None  Procedures:  None  Antimicrobials:  Azithromycin, ceftriaxone x 5 days  Subjective: No acute issues or events overnight, appears to be back to baseline from a mental status point of view per husband, otherwise denies nausea vomiting diarrhea constipation headache fevers chills chest pain or shortness of breath.  Objective: Vitals:   02/08/22 2100 02/08/22 2200 02/08/22 2332 02/09/22 0506  BP: 130/87 133/83 136/75 118/73  Pulse: 100 97 99 92  Resp: '20 20 20 16  '$ Temp:   98 F (36.7  C) 99.1 F (37.3 C)  TempSrc:   Oral Oral  SpO2: 97% 99% 97% 100%  Weight:   102.9 kg 102.9 kg  Height:   '5\' 4"'$  (1.626 m)     Intake/Output Summary (Last 24 hours) at 02/09/2022 0721 Last data filed at 02/09/2022 0507 Gross per 24 hour  Intake 246.76 ml  Output 500 ml  Net -253.24 ml   Filed Weights   02/08/22 2332 02/09/22 0506  Weight: 102.9 kg 102.9 kg     Examination:  General:  Pleasantly resting in bed, No acute distress.  Alert to person situation and place HEENT:  Normocephalic atraumatic.  Sclerae nonicteric, noninjected.  Extraocular movements intact bilaterally. Neck:  Without mass or deformity.  Trachea is midline. Lungs:  Clear to auscultate bilaterally without rhonchi, wheeze, or rales. Heart:  Regular rate and rhythm.  Without murmurs, rubs, or gallops. Abdomen:  Soft, nontender, nondistended.  Without guarding or rebound. Extremities: Without cyanosis, clubbing, edema, or obvious deformity. Vascular:  Dorsalis pedis and posterior tibial pulses palpable bilaterally. Skin:  Warm and dry, no erythema  Data Reviewed: I have personally reviewed following labs and imaging studies  CBC: Recent Labs  Lab 02/08/22 0215 02/08/22 0227 02/09/22 0223  WBC 11.7*  --  8.5  NEUTROABS 9.0*  --   --   HGB 13.6 14.3 14.4  HCT 43.1 42.0 44.3  MCV 100.5*  --  97.8  PLT 182  --  341   Basic Metabolic Panel: Recent Labs  Lab 02/08/22 0215 02/08/22 0227 02/09/22 0223  NA 137 141 140  K 3.6 4.0 3.8  CL 103 102 106  CO2 23  --  22  GLUCOSE 266* 258* 155*  BUN '16 17 13  '$ CREATININE 2.39* 2.60* 1.75*  CALCIUM 9.4  --  9.3  MG  --   --  2.1   GFR: Estimated Creatinine Clearance: 28.5 mL/min (A) (by C-G formula based on SCr of 1.75 mg/dL (H)). Liver Function Tests: Recent Labs  Lab 02/08/22 0215  AST 32  ALT 21  ALKPHOS 83  BILITOT 0.6  PROT 7.0  ALBUMIN 3.0*   No results for input(s): "LIPASE", "AMYLASE" in the last 168 hours. No results for input(s): "AMMONIA" in the last 168 hours. Coagulation Profile: Recent Labs  Lab 02/08/22 0215  INR 1.3*   Cardiac Enzymes: No results for input(s): "CKTOTAL", "CKMB", "CKMBINDEX", "TROPONINI" in the last 168 hours. BNP (last 3 results) No results for input(s): "PROBNP" in the last 8760 hours. HbA1C: No results for input(s): "HGBA1C" in the last 72 hours. CBG: Recent Labs   Lab 02/09/22 0504  GLUCAP 146*   Lipid Profile: No results for input(s): "CHOL", "HDL", "LDLCALC", "TRIG", "CHOLHDL", "LDLDIRECT" in the last 72 hours. Thyroid Function Tests: No results for input(s): "TSH", "T4TOTAL", "FREET4", "T3FREE", "THYROIDAB" in the last 72 hours. Anemia Panel: No results for input(s): "VITAMINB12", "FOLATE", "FERRITIN", "TIBC", "IRON", "RETICCTPCT" in the last 72 hours. Sepsis Labs: Recent Labs  Lab 02/08/22 0218 02/08/22 0500  LATICACIDVEN 3.8* 3.4*    Recent Results (from the past 240 hour(s))  Blood Culture (routine x 2)     Status: None (Preliminary result)   Collection Time: 02/08/22  2:15 AM   Specimen: BLOOD  Result Value Ref Range Status   Specimen Description BLOOD LEFT ARM  Final   Special Requests   Final    BOTTLES DRAWN AEROBIC AND ANAEROBIC Blood Culture adequate volume   Culture   Final    NO GROWTH 1  DAY Performed at [facility], [place] [address], [place], [place] [address]    Report Status PENDING  Incomplete  Resp panel by RT-PCR (RSV, Flu A&B, Covid) Anterior Nasal Swab     Status: None   Collection Time: 02/08/22  2:18 AM   Specimen: Anterior Nasal Swab  Result Value Ref Range Status   SARS Coronavirus 2 by RT PCR NEGATIVE NEGATIVE Final    Comment: (NOTE) SARS-CoV-2 target nucleic acids are NOT DETECTED.  The SARS-CoV-2 RNA is generally detectable in upper respiratory specimens during the acute phase of infection. The lowest concentration of SARS-CoV-2 viral copies this assay can detect is 138 copies/mL. A negative result does not preclude SARS-Cov-2 infection and should not be used as the sole basis for treatment or other patient management decisions. A negative result may occur with  improper specimen collection/handling, submission of specimen other than nasopharyngeal swab, presence of viral mutation(s) within the areas targeted by this assay, and inadequate number of viral copies(<138 copies/mL). A negative  result must be combined with clinical observations, patient history, and epidemiological information. The expected result is Negative.  Fact Sheet for Patients:  EntrepreneurPulse.com.au  Fact Sheet for Healthcare Providers:  IncredibleEmployment.be  This test is no t yet approved or cleared by the [place] FDA and  has been authorized for detection and/or diagnosis of SARS-CoV-2 by FDA under an Emergency Use Authorization (EUA). This EUA will remain  in effect (meaning this test can be used) for the duration of the COVID-19 declaration under Section 564(b)(1) of the Act, 21 U.S.C.section 360bbb-3(b)(1), unless the authorization is terminated  or revoked sooner.       Influenza A by PCR NEGATIVE NEGATIVE Final   Influenza B by PCR NEGATIVE NEGATIVE Final    Comment: (NOTE) The Xpert Xpress SARS-CoV-2/FLU/RSV plus assay is intended as an aid in the diagnosis of influenza from Nasopharyngeal swab specimens and should not be used as a sole basis for treatment. Nasal washings and aspirates are unacceptable for Xpert Xpress SARS-CoV-2/FLU/RSV testing.  Fact Sheet for Patients: EntrepreneurPulse.com.au  Fact Sheet for Healthcare Providers: IncredibleEmployment.be  This test is not yet approved or cleared by the [place] FDA and has been authorized for detection and/or diagnosis of SARS-CoV-2 by FDA under an Emergency Use Authorization (EUA). This EUA will remain in effect (meaning this test can be used) for the duration of the COVID-19 declaration under Section 564(b)(1) of the Act, 21 U.S.C. section 360bbb-3(b)(1), unless the authorization is terminated or revoked.     Resp Syncytial Virus by PCR NEGATIVE NEGATIVE Final    Comment: (NOTE) Fact Sheet for Patients: EntrepreneurPulse.com.au  Fact Sheet for Healthcare Providers: IncredibleEmployment.be  This  test is not yet approved or cleared by the [place] FDA and has been authorized for detection and/or diagnosis of SARS-CoV-2 by FDA under an Emergency Use Authorization (EUA). This EUA will remain in effect (meaning this test can be used) for the duration of the COVID-19 declaration under Section 564(b)(1) of the Act, 21 U.S.C. section 360bbb-3(b)(1), unless the authorization is terminated or revoked.  Performed at [facility], [place] [address], [place], [place] [address]   Blood Culture (routine x 2)     Status: None (Preliminary result)   Collection Time: 02/08/22  2:20 AM   Specimen: BLOOD  Result Value Ref Range Status   Specimen Description BLOOD RIGHT HAND  Final   Special Requests   Final    BOTTLES DRAWN AEROBIC AND ANAEROBIC Blood  Culture adequate volume   Culture   Final    NO GROWTH 1 DAY Performed at [facility], [place] [address] [place], [place] [address]    Report Status PENDING  Incomplete         Radiology Studies: ECHOCARDIOGRAM COMPLETE  Result Date: 02/08/2022    ECHOCARDIOGRAM REPORT   Patient Name:   ERILYN PEARMAN Date of Exam: 02/08/2022 Medical Rec #:  [id number]       Height:       64.0 in Accession #:    [id number]      Weight:       220.0 lb Date of Birth:  1940-09-14       BSA:          2.037 m Patient Age:    34 years        BP:           121/88 mmHg Patient Gender: F               HR:           98 bpm. Exam Location:  Inpatient Procedure: 2D Echo Indications:    elevated troponin  History:        Patient has prior history of Echocardiogram examinations, most                 recent 10/19/2010. PAD; Risk Factors:Diabetes and Hypertension.  Sonographer:    Harvie Junior Referring Phys: [id number] TIMOTHY S OPYD  Sonographer Comments: Technically difficult study due to poor echo windows and patient is obese. Image acquisition challenging due to patient body habitus. IMPRESSIONS  1. Left ventricular ejection fraction, by estimation, is 60 to 65%.  The left ventricle has normal function. Left ventricular endocardial border not optimally defined to evaluate regional wall motion. Indeterminate diastolic filling due to E-A fusion.  2. Right ventricular systolic function was not well visualized. The right ventricular size is not well visualized.  3. The mitral valve was not well visualized. Trivial mitral valve regurgitation. No evidence of mitral stenosis.  4. The aortic valve was not well visualized. Aortic valve regurgitation is not visualized. No aortic stenosis is present. FINDINGS  Left Ventricle: Left ventricular ejection fraction, by estimation, is 60 to 65%. The left ventricle has normal function. Left ventricular endocardial border not optimally defined to evaluate regional wall motion. The left ventricular internal cavity size was normal in size. There is no left ventricular hypertrophy. Indeterminate diastolic filling due to E-A fusion. Right Ventricle: The right ventricular size is not well visualized. Right vetricular wall thickness was not well visualized. Right ventricular systolic function was not well visualized. Left Atrium: Left atrial size was not well visualized. Right Atrium: Right atrial size was not well visualized. Pericardium: Trivial pericardial effusion is present. Presence of epicardial fat layer. Mitral Valve: The mitral valve was not well visualized. Trivial mitral valve regurgitation. No evidence of mitral valve stenosis. Tricuspid Valve: The tricuspid valve is not well visualized. Tricuspid valve regurgitation is trivial. No evidence of tricuspid stenosis. Aortic Valve: The aortic valve was not well visualized. Aortic valve regurgitation is not visualized. No aortic stenosis is present. Aortic valve mean gradient measures 1.0 mmHg. Aortic valve peak gradient measures 2.5 mmHg. Aortic valve area, by VTI measures 2.46 cm. Pulmonic Valve: The pulmonic valve was not well visualized. Pulmonic valve regurgitation is not visualized.  Aorta: The aortic root is normal in size and structure. Venous: The inferior vena cava was not well visualized. IAS/Shunts:  The interatrial septum was not well visualized.  LEFT VENTRICLE PLAX 2D LVIDd:         3.80 cm     Diastology LVIDs:         2.50 cm     LV e' medial:    7.30 cm/s LV PW:         0.90 cm     LV E/e' medial:  7.3 LV IVS:        1.10 cm     LV e' lateral:   6.53 cm/s LVOT diam:     1.90 cm     LV E/e' lateral: 8.2 LV SV:         30 LV SV Index:   15 LVOT Area:     2.84 cm  LV Volumes (MOD) LV vol d, MOD A2C: 24.2 ml LV vol d, MOD A4C: 34.2 ml LV vol s, MOD A2C: 11.2 ml LV vol s, MOD A4C: 11.6 ml LV SV MOD A2C:     13.0 ml LV SV MOD A4C:     34.2 ml LV SV MOD BP:      19.6 ml RIGHT VENTRICLE RV S prime:     7.62 cm/s TAPSE (M-mode): 1.0 cm LEFT ATRIUM         Index LA diam:    2.60 cm 1.28 cm/m  AORTIC VALVE                    PULMONIC VALVE AV Area (Vmax):    2.65 cm     PV Vmax:       0.59 m/s AV Area (Vmean):   2.50 cm     PV Peak grad:  1.4 mmHg AV Area (VTI):     2.46 cm AV Vmax:           79.30 cm/s AV Vmean:          53.200 cm/s AV VTI:            0.120 m AV Peak Grad:      2.5 mmHg AV Mean Grad:      1.0 mmHg LVOT Vmax:         74.10 cm/s LVOT Vmean:        47.000 cm/s LVOT VTI:          0.104 m LVOT/AV VTI ratio: 0.87  AORTA Ao Root diam: 2.70 cm MITRAL VALVE               TRICUSPID VALVE MV Area (PHT): 4.10 cm    TR Peak grad:   31.6 mmHg MV Decel Time: 185 msec    TR Vmax:        281.00 cm/s MV E velocity: 53.60 cm/s MV A velocity: 95.50 cm/s  SHUNTS MV E/A ratio:  0.56        Systemic VTI:  0.10 m                            Systemic Diam: 1.90 cm Eleonore Chiquito MD Electronically signed by Eleonore Chiquito MD Signature Date/Time: 02/08/2022/6:16:10 PM    Final    CT CHEST ABDOMEN PELVIS WO CONTRAST  Result Date: 02/08/2022 CLINICAL DATA:  Sepsis workup. EXAM: CT CHEST, ABDOMEN AND PELVIS WITHOUT CONTRAST TECHNIQUE: Multidetector CT imaging of the chest, abdomen and pelvis was  performed following the standard protocol without IV contrast. RADIATION DOSE REDUCTION: This exam was performed according to the departmental  dose-optimization program which includes automated exposure control, adjustment of the mA and/or kV according to patient size and/or use of iterative reconstruction technique. COMPARISON:  Portable chest today, chest radiograph 08/11/2021. FINDINGS: CT CHEST FINDINGS Cardiovascular: The heart is slightly enlarged. There is a small pericardial effusion anteriorly. There is no visible coronary calcification. There are calcific plaques in the aorta and great vessels. There is no aortic aneurysm. The pulmonary arteries and veins are normal caliber. 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 Nonemergent ultrasound follow-up is recommended. No axillary or intrathoracic adenopathy is seen without contrast. There are calcified left hilar nodes. There are few borderline size right paratracheal nodes. The thoracic trachea and thoracic esophagus are unremarkable. Lungs/Pleura: No pleural effusion, thickening or pneumothorax. There is patchy opacity in the superior segment of the left lower lobe most likely due to a small pneumonia. Mild posterior atelectasis is seen elsewhere in the lower lobes. No other focal infiltrate is seen allowing for breathing motion. No pulmonary nodule is seen through the breathing motion. Musculoskeletal: There is thoracic spondylosis. Mild thoracic kyphodextroscoliosis. No acute or significant osseous findings. CT ABDOMEN PELVIS FINDINGS Technical note: There is a significant loss of attenuation in the posterior retroperitoneum due to the patient's arms in the field causing beam hardening artifact. This extends to involve the posterior and the deep pelvis more inferiorly. Hepatobiliary: There is additional respiratory motion further limited evaluation of the liver. The liver 17 cm length with a  least mild steatosis. No mass is seen through the beam hardening and breathing motion artifacts. Pancreas: No masses seen without contrast and allowing for respiratory motion. There is questionable trace stranding at the pancreatic head versus artifact from respiratory motion. Spleen: Not well seen. There are calcified granulomas. It is grossly normal in size. Beyond that I cannot make further evaluation due to loss of image photons through the area. Adrenals/Urinary Tract: There is no adrenal mass. No focal renal abnormality is seen through the beam hardening and respiratory motion. There is no urinary stone or obstruction. The pelvic ureters are not evaluated due to beam hardening through the posterior pelvis. The posterior bladder is also obscured by this. The anterior bladder is unremarkable. Multiple pelvic phleboliths. Stomach/Bowel: Visualized portions of the stomach are unremarkable. There is mild dilatation of the jejunum and downstream left mid to lower abdominal small bowel up to 3 cm, relative decompression in the right lower quadrant and pelvic small bowel. A discrete transitional segment could not be seen. This could be due to ileus or low-grade small bowel obstruction. There are no focal inflammatory changes. There is no fecalized small bowel segment serving as a point of transition. The large bowel is unremarkable apart from mild rectosigmoid fecal stasis. Vascular/Lymphatic: There is patchy aortoiliac calcific plaque without AAA. No enlarged lymph nodes are seen, with limited view of the retroperitoneum the posterior pelvis. Reproductive: Status post hysterectomy. No adnexal masses. Other: There is no free air, free hemorrhage or free fluid. No incarcerated hernia. Musculoskeletal: There is severe facet hypertrophy in the lumbar spine with acquired spinal stenosis and grade 1 spondylolisthesis both at L4-5 and L5-S1, with transitional anatomy lumbosacral junction. There is osteopenia. No acute or  other significant osseous findings. IMPRESSION: 1. Left lower lobe pneumonia in the superior segment. 2. Cardiomegaly with small pericardial effusion. 3. Aortic atherosclerosis. 4. 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 Nonemergent ultrasound follow-up is recommended.  5. Limited evaluation of the abdomen and pelvis due to respiratory motion and beam hardening artifact. 6. Questionable trace stranding at the pancreatic head versus artifact from respiratory motion. Correlate clinically for pancreatitis. 7. Mild dilatation of the jejunum and downstream left mid to lower abdominal small bowel up to 3 cm, with relative decompression in the right lower quadrant and pelvic small bowel. A discrete transitional segment could not be seen. This could be due to ileus or low-grade small bowel obstruction. 8. Osteopenia and degenerative change. Electronically Signed   By: Telford Nab M.D.   On: 02/08/2022 04:36   DG Chest Port 1 View  Result Date: 02/08/2022 CLINICAL DATA:  Questionable sepsis - evaluate for abnormality EXAM: PORTABLE CHEST 1 VIEW COMPARISON:  Chest x-ray 08/11/2021, chest x-ray 04/01/2014 FINDINGS: Persistent enlarged cardiac silhouette and Similar-appearing prominent thoracic aorta with some component likely due to AP portable technique. Otherwise the heart and mediastinal contours are unchanged. No focal consolidation. No pulmonary edema. No pleural effusion. No pneumothorax. No acute osseous abnormality. IMPRESSION: Enlarged cardiac silhouette with underlying pericardial effusion not excluded. Electronically Signed   By: Iven Finn M.D.   On: 02/08/2022 02:45    Scheduled Meds:  atorvastatin  10 mg Oral Daily   heparin  5,000 Units Subcutaneous Q8H   sodium chloride flush  3 mL Intravenous Q12H   Continuous Infusions:  azithromycin Stopped (02/08/22 0912)   cefTRIAXone (ROCEPHIN)  IV 2 g (02/09/22 0549)     LOS: 1 day   Time spent: 51mn  Avyon Herendeen  C Dreama Kuna, DO [facility]  If 7PM-7AM, please contact night-coverage [email or web address]  02/09/2022, 7:21 AM

## 2022-02-09 NOTE — Progress Notes (Signed)
Mobility Specialist - Progress Note   02/09/22 1157  Mobility  Activity Ambulated with assistance in room  Level of Assistance Moderate assist, patient does 50-74%  Assistive Device Front wheel walker  Distance Ambulated (ft) 20 ft  Activity Response Tolerated well  Mobility Referral Yes  $Mobility charge 1 Mobility    During mobility: 92% SpO2  Pt received in bed and agreeable to session. Upon uncover pt, pt had BM but was not aware. NT assiatnce in pt clean up. Pt was ModA for EOB and ambulation. Pt needed multiple vocal ques throughout session. Pt was returned to bed with all needs met and bed alarm on.   Franki Monte  Mobility Specialist Please contact via Solicitor or Rehab office at [phone]

## 2022-02-10 ENCOUNTER — Other Ambulatory Visit (HOSPITAL_COMMUNITY): Payer: Self-pay

## 2022-02-10 DIAGNOSIS — J189 Pneumonia, unspecified organism: Secondary | ICD-10-CM | POA: Diagnosis not present

## 2022-02-10 DIAGNOSIS — A419 Sepsis, unspecified organism: Secondary | ICD-10-CM | POA: Diagnosis not present

## 2022-02-10 LAB — GLUCOSE, CAPILLARY
Glucose-Capillary: 143 mg/dL — ABNORMAL HIGH (ref 70–99)
Glucose-Capillary: 148 mg/dL — ABNORMAL HIGH (ref 70–99)

## 2022-02-10 LAB — CBC
HCT: 35.8 % — ABNORMAL LOW (ref 36.0–46.0)
Hemoglobin: 11.8 g/dL — ABNORMAL LOW (ref 12.0–15.0)
MCH: 31.6 pg (ref 26.0–34.0)
MCHC: 33 g/dL (ref 30.0–36.0)
MCV: 95.7 fL (ref 80.0–100.0)
Platelets: 185 10*3/uL (ref 150–400)
RBC: 3.74 MIL/uL — ABNORMAL LOW (ref 3.87–5.11)
RDW: 14.1 % (ref 11.5–15.5)
WBC: 7.2 10*3/uL (ref 4.0–10.5)
nRBC: 0 % (ref 0.0–0.2)

## 2022-02-10 LAB — BASIC METABOLIC PANEL
Anion gap: 9 (ref 5–15)
BUN: 10 mg/dL (ref 8–23)
CO2: 22 mmol/L (ref 22–32)
Calcium: 8.8 mg/dL — ABNORMAL LOW (ref 8.9–10.3)
Chloride: 108 mmol/L (ref 98–111)
Creatinine, Ser: 1.37 mg/dL — ABNORMAL HIGH (ref 0.44–1.00)
GFR, Estimated: 38 mL/min — ABNORMAL LOW (ref >60)
Glucose, Bld: 176 mg/dL — ABNORMAL HIGH (ref 70–99)
Potassium: 3.4 mmol/L — ABNORMAL LOW (ref 3.5–5.1)
Sodium: 139 mmol/L (ref 135–145)

## 2022-02-10 LAB — LEGIONELLA PNEUMOPHILA SEROGP 1 UR AG: L. pneumophila Serogp 1 Ur Ag: NEGATIVE

## 2022-02-10 MED ORDER — AZITHROMYCIN 250 MG PO TABS
250.0000 mg | ORAL_TABLET | Freq: Every day | ORAL | 0 refills | Status: DC
  Filled 2022-02-10: qty 2, 2d supply, fill #0

## 2022-02-10 MED ORDER — POTASSIUM CHLORIDE CRYS ER 20 MEQ PO TBCR
40.0000 meq | EXTENDED_RELEASE_TABLET | Freq: Once | ORAL | Status: AC
  Administered 2022-02-10: 40 meq via ORAL
  Filled 2022-02-10: qty 2

## 2022-02-10 MED ORDER — CEFPODOXIME PROXETIL 200 MG PO TABS
100.0000 mg | ORAL_TABLET | Freq: Two times a day (BID) | ORAL | 0 refills | Status: DC
  Filled 2022-02-10: qty 2, 2d supply, fill #0

## 2022-02-10 MED ORDER — AZITHROMYCIN 250 MG PO TABS
500.0000 mg | ORAL_TABLET | Freq: Every day | ORAL | Status: DC
  Administered 2022-02-10: 500 mg via ORAL
  Filled 2022-02-10: qty 2

## 2022-02-10 NOTE — Discharge Summary (Signed)
Physician Discharge Summary   Patient: Angel Green MRN: [id number] DOB: 09-20-1940  Admit date:     02/08/2022  Discharge date: 02/10/22  Discharge Physician: Patrecia Pour   PCP: Colon Branch, MD   Recommendations at discharge:   Follow up with PCP in 1-2 weeks, suggest recheck BMP, holding losartan until this visit.  Will need thyroid U/S for incidental mass noted on CT chest.  Discharge Diagnoses: Principal Problem:   Sepsis due to pneumonia [facility]) Active Problems:   HTN (hypertension)   CKD (chronic kidney disease), stage IV (HCC)   Diabetes ([place])   PAD (peripheral artery disease) ([place])   Severe late onset Alzheimer's dementia ([place])   Elevated troponin   Nausea & vomiting   Thyroid mass   Ileus (HCC)   Severe sepsis [facility])   Hospital Course: Angel Green is a 81 y.o. female with medical history significant for advanced dementia, insulin-dependent diabetes mellitus, CKD stage IV, hypertension, and PAD who presented to the ED 12/18 with noisy breathing, hypertension, hyperglycemia, and confusion. Work up significant for pneumonia and dehydration with AKI for which antibiotics, IVF were given with return to baseline respiratory and mental statuses. She is stable for discharge on 12/20 to the care of her husband and Niece who can together provided assistance 24/7. Please see details below.   Assessment and Plan: Severe sepsis secondary to PNA: Cx's and viral work up negative. Complete abx with cefpodoxime and azithromycin   Ileus vs early SBO: Resolved. Patient and family indicates intractable nausea vomiting and questionable diarrhea prior to admission -Imaging at intake concerning for ileus versus low-grade small bowel obstruction, though symptoms have resolved. Tolerating po. Exam benign.   Elevated troponin due to demand myocardial ischemia in setting of sepsis. No chest pain or other anginal equivalent.  - Outpatient follow up recommended.     AKI on CKD IV:  Responsive to IVF, abx. Follow up with PCP in 1 week to include recheck BMP. Holding losartan until that time.    Dementia: Continue home meds. Abbreviate hospitalization as much as possible, would not be good candidate for SNF. Back to mental baseline per husband.   - Home health arranged  PAD: No acute issues. No change in home medication.    Hypertension: Hold losartan as above, recheck BP at follow up. Note current normotensive holding home medications.    Thyroid mass: Noted incidentally on CT in ED - Outpatient follow-up recommended for [place]   Consultants: None Procedures performed: None  Disposition: Home Diet recommendation: Cardiac DISCHARGE MEDICATION: Allergies as of 02/10/2022       Reactions   Dust Mite Mixed Allergen Ext [mite (d. Farinae)]         Medication List     STOP taking these medications    levocetirizine 5 MG tablet Commonly known as: XYZAL   losartan 50 MG tablet Commonly known as: COZAAR       TAKE these medications    amLODipine 5 MG tablet Commonly known as: NORVASC Take 1 tablet (5 mg total) by mouth daily.   aspirin 81 MG chewable tablet Chew 81 mg by mouth daily.   atorvastatin 10 MG tablet Commonly known as: LIPITOR Take 1 tablet (10 mg total) by mouth daily.   azelastine 0.1 % nasal spray Commonly known as: ASTELIN Place into both nostrils 2 (two) times daily. Use in each nostril as directed   azithromycin 250 MG tablet Commonly known as: ZITHROMAX Take 1 tablet (250 mg total) by mouth  daily.   B-12 PO Take 1 tablet by mouth daily. Takes 5000 mcg by mouth daily.   cefpodoxime 200 MG tablet Commonly known as: VANTIN Take 1/2 tablet (100 mg total) by mouth 2 (two) times daily.   CENTRUM SILVER PO Take 1 tablet by mouth daily.   donepezil 10 MG tablet Commonly known as: ARICEPT Take 1 tablet (10 mg total) by mouth at bedtime.   Dulaglutide 4.5 MG/0.5ML Sopn Inject 4.5 mg into the skin once a week.   DULoxetine  20 MG capsule Commonly known as: CYMBALTA Take 20 mg by mouth daily.   EPINEPHrine 0.3 mg/0.3 mL Soaj injection Commonly known as: EPI-PEN Inject 0.3 mg into the muscle once. Reported on 06/30/2015   FISH OIL PO Take 1 capsule by mouth daily in the afternoon.   freestyle lancets Use to monitor blood sugars 4 times per day, once before each meal and once at bedtime   FreeStyle Libre 2 Sensor Misc 1 Device by Does not apply route every 14 (fourteen) days.   FreeStyle Lite Devi Use to monitor your blood sugars   FREESTYLE LITE test strip Generic drug: glucose blood Use to monitor your blood sugars 4 times per day; E11.9   furosemide 20 MG tablet Commonly known as: LASIX Take 2 tablets (40 mg total) by mouth daily.   insulin lispro 100 UNIT/ML KwikPen Commonly known as: HumaLOG KwikPen 5-10 units before meals What changed: additional instructions   Insulin Lispro Prot & Lispro (75-25) 100 UNIT/ML Kwikpen Commonly known as: HumaLOG Mix 75/25 KwikPen Inject 60 Units into the skin daily with breakfast. And pen needles 1/day What changed: how much to take   loratadine 10 MG tablet Commonly known as: CLARITIN TAKE 1 TABLET DAILY   memantine 10 MG tablet Commonly known as: NAMENDA Take 1 tablet (10 mg total) by mouth 2 (two) times daily.   montelukast 10 MG tablet Commonly known as: SINGULAIR Take 10 mg by mouth at bedtime.   potassium chloride 10 MEQ tablet Commonly known as: KLOR-CON Take 1 tablet (10 mEq total) by mouth daily.   QUEtiapine 50 MG tablet Commonly known as: SEROQUEL Take 1 tablet (50 mg total) by mouth at bedtime.   Sure Comfort Pen Needles 31G X 5 MM Misc Generic drug: Insulin Pen Needle Use to inject insulin 1 time per day.        Follow-up [place], MD Follow up.   Specialty: Internal Medicine Contact information: [address] [place] [address] [place], [facility]  Follow up.   Specialty: Home Health Services Why: Home Health Physical and Occupational Therapy-office to call within 24-48 hours for visit times. Contact information: [address] [place] [address] [phone]                Discharge Exam: Filed Weights   02/08/22 2332 02/09/22 0506 02/10/22 0500  Weight: 102.9 kg 102.9 kg 102.1 kg  BP 128/75 (BP Location: Left Wrist)   Pulse 87   Temp 98.2 F (36.8 C) (Oral)   Resp 16   Ht '5\' 4"'$  (1.626 m)   Wt 102.1 kg   SpO2 100%   BMI 38.64 kg/m   No distress, elderly Clear, nonlabored RRR, no MRG or JVD Soft, NT, ND +BS Alert, not oriented. Cooperative.   Condition at discharge: stable  The results of significant diagnostics from this hospitalization (  including imaging, microbiology, ancillary and laboratory) are listed below for reference.   Imaging Studies: ECHOCARDIOGRAM COMPLETE  Result Date: 02/08/2022    ECHOCARDIOGRAM REPORT   Patient Name:   TYSHELL RAMBERG Date of Exam: 02/08/2022 Medical Rec #:  [id number]       Height:       64.0 in Accession #:    [id number]      Weight:       220.0 lb Date of Birth:  Jul 23, 1940       BSA:          2.037 m Patient Age:    64 years        BP:           121/88 mmHg Patient Gender: F               HR:           98 bpm. Exam Location:  Inpatient Procedure: 2D Echo Indications:    elevated troponin  History:        Patient has prior history of Echocardiogram examinations, most                 recent 10/19/2010. PAD; Risk Factors:Diabetes and Hypertension.  Sonographer:    Harvie Junior Referring Phys: [id number] TIMOTHY S OPYD  Sonographer Comments: Technically difficult study due to poor echo windows and patient is obese. Image acquisition challenging due to patient body habitus. IMPRESSIONS  1. Left ventricular ejection fraction, by estimation, is 60 to 65%. The left ventricle has normal function. Left ventricular endocardial border not optimally defined to evaluate regional wall  motion. Indeterminate diastolic filling due to E-A fusion.  2. Right ventricular systolic function was not well visualized. The right ventricular size is not well visualized.  3. The mitral valve was not well visualized. Trivial mitral valve regurgitation. No evidence of mitral stenosis.  4. The aortic valve was not well visualized. Aortic valve regurgitation is not visualized. No aortic stenosis is present. FINDINGS  Left Ventricle: Left ventricular ejection fraction, by estimation, is 60 to 65%. The left ventricle has normal function. Left ventricular endocardial border not optimally defined to evaluate regional wall motion. The left ventricular internal cavity size was normal in size. There is no left ventricular hypertrophy. Indeterminate diastolic filling due to E-A fusion. Right Ventricle: The right ventricular size is not well visualized. Right vetricular wall thickness was not well visualized. Right ventricular systolic function was not well visualized. Left Atrium: Left atrial size was not well visualized. Right Atrium: Right atrial size was not well visualized. Pericardium: Trivial pericardial effusion is present. Presence of epicardial fat layer. Mitral Valve: The mitral valve was not well visualized. Trivial mitral valve regurgitation. No evidence of mitral valve stenosis. Tricuspid Valve: The tricuspid valve is not well visualized. Tricuspid valve regurgitation is trivial. No evidence of tricuspid stenosis. Aortic Valve: The aortic valve was not well visualized. Aortic valve regurgitation is not visualized. No aortic stenosis is present. Aortic valve mean gradient measures 1.0 mmHg. Aortic valve peak gradient measures 2.5 mmHg. Aortic valve area, by VTI measures 2.46 cm. Pulmonic Valve: The pulmonic valve was not well visualized. Pulmonic valve regurgitation is not visualized. Aorta: The aortic root is normal in size and structure. Venous: The inferior vena cava was not well visualized. IAS/Shunts: The  interatrial septum was not well visualized.  LEFT VENTRICLE PLAX 2D LVIDd:         3.80 cm     Diastology LVIDs:  2.50 cm     LV e' medial:    7.30 cm/s LV PW:         0.90 cm     LV E/e' medial:  7.3 LV IVS:        1.10 cm     LV e' lateral:   6.53 cm/s LVOT diam:     1.90 cm     LV E/e' lateral: 8.2 LV SV:         30 LV SV Index:   15 LVOT Area:     2.84 cm  LV Volumes (MOD) LV vol d, MOD A2C: 24.2 ml LV vol d, MOD A4C: 34.2 ml LV vol s, MOD A2C: 11.2 ml LV vol s, MOD A4C: 11.6 ml LV SV MOD A2C:     13.0 ml LV SV MOD A4C:     34.2 ml LV SV MOD BP:      19.6 ml RIGHT VENTRICLE RV S prime:     7.62 cm/s TAPSE (M-mode): 1.0 cm LEFT ATRIUM         Index LA diam:    2.60 cm 1.28 cm/m  AORTIC VALVE                    PULMONIC VALVE AV Area (Vmax):    2.65 cm     PV Vmax:       0.59 m/s AV Area (Vmean):   2.50 cm     PV Peak grad:  1.4 mmHg AV Area (VTI):     2.46 cm AV Vmax:           79.30 cm/s AV Vmean:          53.200 cm/s AV VTI:            0.120 m AV Peak Grad:      2.5 mmHg AV Mean Grad:      1.0 mmHg LVOT Vmax:         74.10 cm/s LVOT Vmean:        47.000 cm/s LVOT VTI:          0.104 m LVOT/AV VTI ratio: 0.87  AORTA Ao Root diam: 2.70 cm MITRAL VALVE               TRICUSPID VALVE MV Area (PHT): 4.10 cm    TR Peak grad:   31.6 mmHg MV Decel Time: 185 msec    TR Vmax:        281.00 cm/s MV E velocity: 53.60 cm/s MV A velocity: 95.50 cm/s  SHUNTS MV E/A ratio:  0.56        Systemic VTI:  0.10 m                            Systemic Diam: 1.90 cm Eleonore Chiquito MD Electronically signed by Eleonore Chiquito MD Signature Date/Time: 02/08/2022/6:16:10 PM    Final    CT CHEST ABDOMEN PELVIS WO CONTRAST  Result Date: 02/08/2022 CLINICAL DATA:  Sepsis workup. EXAM: CT CHEST, ABDOMEN AND PELVIS WITHOUT CONTRAST TECHNIQUE: Multidetector CT imaging of the chest, abdomen and pelvis was performed following the standard protocol without IV contrast. RADIATION DOSE REDUCTION: This exam was performed according to the  departmental dose-optimization program which includes automated exposure control, adjustment of the mA and/or kV according to patient size and/or use of iterative reconstruction technique. COMPARISON:  Portable chest today, chest radiograph 08/11/2021. FINDINGS: CT CHEST FINDINGS Cardiovascular: The  heart is slightly enlarged. There is a small pericardial effusion anteriorly. There is no visible coronary calcification. There are calcific plaques in the aorta and great vessels. There is no aortic aneurysm. The pulmonary arteries and veins are normal caliber. 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 Nonemergent ultrasound follow-up is recommended. No axillary or intrathoracic adenopathy is seen without contrast. There are calcified left hilar nodes. There are few borderline size right paratracheal nodes. The thoracic trachea and thoracic esophagus are unremarkable. Lungs/Pleura: No pleural effusion, thickening or pneumothorax. There is patchy opacity in the superior segment of the left lower lobe most likely due to a small pneumonia. Mild posterior atelectasis is seen elsewhere in the lower lobes. No other focal infiltrate is seen allowing for breathing motion. No pulmonary nodule is seen through the breathing motion. Musculoskeletal: There is thoracic spondylosis. Mild thoracic kyphodextroscoliosis. No acute or significant osseous findings. CT ABDOMEN PELVIS FINDINGS Technical note: There is a significant loss of attenuation in the posterior retroperitoneum due to the patient's arms in the field causing beam hardening artifact. This extends to involve the posterior and the deep pelvis more inferiorly. Hepatobiliary: There is additional respiratory motion further limited evaluation of the liver. The liver 17 cm length with a least mild steatosis. No mass is seen through the beam hardening and breathing motion artifacts. Pancreas: No masses seen without  contrast and allowing for respiratory motion. There is questionable trace stranding at the pancreatic head versus artifact from respiratory motion. Spleen: Not well seen. There are calcified granulomas. It is grossly normal in size. Beyond that I cannot make further evaluation due to loss of image photons through the area. Adrenals/Urinary Tract: There is no adrenal mass. No focal renal abnormality is seen through the beam hardening and respiratory motion. There is no urinary stone or obstruction. The pelvic ureters are not evaluated due to beam hardening through the posterior pelvis. The posterior bladder is also obscured by this. The anterior bladder is unremarkable. Multiple pelvic phleboliths. Stomach/Bowel: Visualized portions of the stomach are unremarkable. There is mild dilatation of the jejunum and downstream left mid to lower abdominal small bowel up to 3 cm, relative decompression in the right lower quadrant and pelvic small bowel. A discrete transitional segment could not be seen. This could be due to ileus or low-grade small bowel obstruction. There are no focal inflammatory changes. There is no fecalized small bowel segment serving as a point of transition. The large bowel is unremarkable apart from mild rectosigmoid fecal stasis. Vascular/Lymphatic: There is patchy aortoiliac calcific plaque without AAA. No enlarged lymph nodes are seen, with limited view of the retroperitoneum the posterior pelvis. Reproductive: Status post hysterectomy. No adnexal masses. Other: There is no free air, free hemorrhage or free fluid. No incarcerated hernia. Musculoskeletal: There is severe facet hypertrophy in the lumbar spine with acquired spinal stenosis and grade 1 spondylolisthesis both at L4-5 and L5-S1, with transitional anatomy lumbosacral junction. There is osteopenia. No acute or other significant osseous findings. IMPRESSION: 1. Left lower lobe pneumonia in the superior segment. 2. Cardiomegaly with small  pericardial effusion. 3. Aortic atherosclerosis. 4. 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 Nonemergent ultrasound follow-up is recommended. 5. Limited evaluation of the abdomen and pelvis due to respiratory motion and beam hardening artifact. 6. Questionable trace stranding at the pancreatic head versus artifact from respiratory motion. Correlate clinically for pancreatitis. 7. Mild dilatation of  the jejunum and downstream left mid to lower abdominal small bowel up to 3 cm, with relative decompression in the right lower quadrant and pelvic small bowel. A discrete transitional segment could not be seen. This could be due to ileus or low-grade small bowel obstruction. 8. Osteopenia and degenerative change. Electronically Signed   By: Telford Nab M.D.   On: 02/08/2022 04:36   DG Chest Port 1 View  Result Date: 02/08/2022 CLINICAL DATA:  Questionable sepsis - evaluate for abnormality EXAM: PORTABLE CHEST 1 VIEW COMPARISON:  Chest x-ray 08/11/2021, chest x-ray 04/01/2014 FINDINGS: Persistent enlarged cardiac silhouette and Similar-appearing prominent thoracic aorta with some component likely due to AP portable technique. Otherwise the heart and mediastinal contours are unchanged. No focal consolidation. No pulmonary edema. No pleural effusion. No pneumothorax. No acute osseous abnormality. IMPRESSION: Enlarged cardiac silhouette with underlying pericardial effusion not excluded. Electronically Signed   By: Iven Finn M.D.   On: 02/08/2022 02:45    Microbiology: Results for orders placed or performed during the hospital encounter of 02/08/22  Blood Culture (routine x 2)     Status: None (Preliminary result)   Collection Time: 02/08/22  2:15 AM   Specimen: BLOOD  Result Value Ref Range Status   Specimen Description BLOOD LEFT ARM  Final   Special Requests   Final    BOTTLES DRAWN AEROBIC AND ANAEROBIC Blood Culture adequate volume   Culture   Final    NO GROWTH 2  DAYS Performed at [facility], [place] [address] [place], [place] [address]    Report Status PENDING  Incomplete  Resp panel by RT-PCR (RSV, Flu A&B, Covid) Anterior Nasal Swab     Status: None   Collection Time: 02/08/22  2:18 AM   Specimen: Anterior Nasal Swab  Result Value Ref Range Status   SARS Coronavirus 2 by RT PCR NEGATIVE NEGATIVE Final    Comment: (NOTE) SARS-CoV-2 target nucleic acids are NOT DETECTED.  The SARS-CoV-2 RNA is generally detectable in upper respiratory specimens during the acute phase of infection. The lowest concentration of SARS-CoV-2 viral copies this assay can detect is 138 copies/mL. A negative result does not preclude SARS-Cov-2 infection and should not be used as the sole basis for treatment or other patient management decisions. A negative result may occur with  improper specimen collection/handling, submission of specimen other than nasopharyngeal swab, presence of viral mutation(s) within the areas targeted by this assay, and inadequate number of viral copies(<138 copies/mL). A negative result must be combined with clinical observations, patient history, and epidemiological information. The expected result is Negative.  Fact Sheet for Patients:  EntrepreneurPulse.com.au  Fact Sheet for Healthcare Providers:  IncredibleEmployment.be  This test is no t yet approved or cleared by the [place] FDA and  has been authorized for detection and/or diagnosis of SARS-CoV-2 by FDA under an Emergency Use Authorization (EUA). This EUA will remain  in effect (meaning this test can be used) for the duration of the COVID-19 declaration under Section 564(b)(1) of the Act, 21 U.S.C.section 360bbb-3(b)(1), unless the authorization is terminated  or revoked sooner.       Influenza A by PCR NEGATIVE NEGATIVE Final   Influenza B by PCR NEGATIVE NEGATIVE Final    Comment: (NOTE) The Xpert Xpress SARS-CoV-2/FLU/RSV  plus assay is intended as an aid in the diagnosis of influenza from Nasopharyngeal swab specimens and should not be used as a sole basis for treatment. Nasal washings and aspirates are unacceptable for Xpert Xpress  SARS-CoV-2/FLU/RSV testing.  Fact Sheet for Patients: EntrepreneurPulse.com.au  Fact Sheet for Healthcare Providers: IncredibleEmployment.be  This test is not yet approved or cleared by the [place] FDA and has been authorized for detection and/or diagnosis of SARS-CoV-2 by FDA under an Emergency Use Authorization (EUA). This EUA will remain in effect (meaning this test can be used) for the duration of the COVID-19 declaration under Section 564(b)(1) of the Act, 21 U.S.C. section 360bbb-3(b)(1), unless the authorization is terminated or revoked.     Resp Syncytial Virus by PCR NEGATIVE NEGATIVE Final    Comment: (NOTE) Fact Sheet for Patients: EntrepreneurPulse.com.au  Fact Sheet for Healthcare Providers: IncredibleEmployment.be  This test is not yet approved or cleared by the [place] FDA and has been authorized for detection and/or diagnosis of SARS-CoV-2 by FDA under an Emergency Use Authorization (EUA). This EUA will remain in effect (meaning this test can be used) for the duration of the COVID-19 declaration under Section 564(b)(1) of the Act, 21 U.S.C. section 360bbb-3(b)(1), unless the authorization is terminated or revoked.  Performed at [facility], [place] [address] [place], [place] [address]   Blood Culture (routine x 2)     Status: None (Preliminary result)   Collection Time: 02/08/22  2:20 AM   Specimen: BLOOD  Result Value Ref Range Status   Specimen Description BLOOD RIGHT HAND  Final   Special Requests   Final    BOTTLES DRAWN AEROBIC AND ANAEROBIC Blood Culture adequate volume   Culture   Final    NO GROWTH 2 DAYS Performed at [facility], [place] [address] [place], [place] [address]    Report Status PENDING  Incomplete    Labs: CBC: Recent Labs  Lab 02/08/22 0215 02/08/22 0227 02/09/22 0223 02/10/22 0156  WBC 11.7*  --  8.5 7.2  NEUTROABS 9.0*  --   --   --   HGB 13.6 14.3 14.4 11.8*  HCT 43.1 42.0 44.3 35.8*  MCV 100.5*  --  97.8 95.7  PLT 182  --  152 749   Basic Metabolic Panel: Recent Labs  Lab 02/08/22 0215 02/08/22 0227 02/09/22 0223 02/10/22 0156  NA 137 141 140 139  K 3.6 4.0 3.8 3.4*  CL 103 102 106 108  CO2 23  --  22 22  GLUCOSE 266* 258* 155* 176*  BUN '16 17 13 10  '$ CREATININE 2.39* 2.60* 1.75* 1.37*  CALCIUM 9.4  --  9.3 8.8*  MG  --   --  2.1  --    Liver Function Tests: Recent Labs  Lab 02/08/22 0215  AST 32  ALT 21  ALKPHOS 83  BILITOT 0.6  PROT 7.0  ALBUMIN 3.0*   CBG: Recent Labs  Lab 02/09/22 1230 02/09/22 1608 02/09/22 2125 02/10/22 0815 02/10/22 1205  GLUCAP 174* 204* 168* 143* 148*    Discharge time spent: greater than 30 minutes.  Signed: Patrecia Pour, MD [facility] 02/10/2022

## 2022-02-10 NOTE — Evaluation (Signed)
Physical Therapy Evaluation Patient Details Name: Angel Green MRN: [id number] DOB: [birth year] Today's Date: 02/10/2022  History of Present Illness  Pt is 80 yo female admitted 02/08/22 with sepsis due to PNE.  Pt with hx including but not limited to advanced dementia, DM, CKD stage IV, HTN, and PAD.  Clinical Impression  Pt admitted with above diagnosis. At baseline, pt is ambulatory in home without AD, requires min A ADLs and getting OOB, assist with stairs.  She has advanced dementia.  Pt has RW at home and 24 hr care.  Today, pt required min-mod A for transfers and ambulated 54' with RW and min A to guide RW.  VSS during session.  Spouse and niece comfortable taking pt home with HHPT/OT.  PT agrees.  Pt currently with functional limitations due to the deficits listed below (see PT Problem List). Pt will benefit from skilled PT to increase their independence and safety with mobility to allow discharge to the venue listed below.          Recommendations for follow up therapy are one component of a multi-disciplinary discharge planning process, led by the attending physician.  Recommendations may be updated based on patient status, additional functional criteria and insurance authorization.  Follow Up Recommendations Home health PT      Assistance Recommended at Discharge Frequent or constant Supervision/Assistance  Patient can return home with the following  A little help with walking and/or transfers;A little help with bathing/dressing/bathroom;Assistance with cooking/housework;Help with stairs or ramp for entrance    Equipment Recommendations None recommended by PT  Recommendations for Other Services       Functional Status Assessment Patient has had a recent decline in their functional status and demonstrates the ability to make significant improvements in function in a reasonable and predictable amount of time.     Precautions / Restrictions Precautions Precautions: Fall       Mobility  Bed Mobility Overal bed mobility: Needs Assistance Bed Mobility: Supine to Sit, Sit to Supine     Supine to sit: Mod assist Sit to supine: Mod assist   General bed mobility comments: increased time    Transfers Overall transfer level: Needs assistance Equipment used: Rolling walker (2 wheels) Transfers: Sit to/from Stand Sit to Stand: Min assist           General transfer comment: increased time; cues for momentum    Ambulation/Gait Ambulation/Gait assistance: Min assist Gait Distance (Feet): 60 Feet Assistive device: Rolling walker (2 wheels) Gait Pattern/deviations: Step-through pattern Gait velocity: decreased     General Gait Details: Slow speed with assist to turn/maneuvar RW, but no loss of balance  Stairs            Wheelchair Mobility    Modified Rankin (Stroke Patients Only)       Balance Overall balance assessment: Needs assistance Sitting-balance support: No upper extremity supported Sitting balance-Leahy Scale: Fair     Standing balance support: Bilateral upper extremity supported, No upper extremity supported Standing balance-Leahy Scale: Fair Standing balance comment: RW to ambulate but could static stand without ad                             Pertinent Vitals/Pain Pain Assessment Pain Assessment: No/denies pain    Home Living Family/patient expects to be discharged to:: Private residence Living Arrangements: Spouse/significant other Available Help at Discharge: Family;Available 24 hours/day Type of Home: House Home Access: Stairs to enter Entrance Stairs-Rails: Right  Entrance Stairs-Number of Steps: 5   Home Layout: Two level;Able to live on main level with bedroom/bathroom Home Equipment: Shower seat;Rolling Walker (2 wheels);Cane - single point      Prior Function Prior Level of Function : Needs assist             Mobility Comments: Mostly sedentary; household ambulation; does not use AD; no  falls ADLs Comments: Mostly independent with toileting; family assist with dressing and bathing due to cogntion and physical need     Hand Dominance        Extremity/Trunk Assessment   Upper Extremity Assessment Upper Extremity Assessment: Generalized weakness;Difficult to assess due to impaired cognition    Lower Extremity Assessment Lower Extremity Assessment: Generalized weakness;Difficult to assess due to impaired cognition    Cervical / Trunk Assessment Cervical / Trunk Assessment: Normal  Communication   Communication: No difficulties  Cognition Arousal/Alertness: Awake/alert Behavior During Therapy: WFL for tasks assessed/performed Overall Cognitive Status: History of cognitive impairments - at baseline                                 General Comments: follows basic commands with increased time and gestures; oriented to self        General Comments General comments (skin integrity, edema, etc.): VSS on RA during session; spouse and niece present and report feel comfortable assisting pt at home    Exercises     Assessment/Plan    PT Assessment Patient needs continued PT services  PT Problem List Decreased strength;Decreased cognition;Decreased activity tolerance;Decreased knowledge of use of DME;Decreased balance;Decreased safety awareness;Decreased mobility;Decreased knowledge of precautions;Cardiopulmonary status limiting activity       PT Treatment Interventions DME instruction;Therapeutic exercise;Gait training;Balance training;Stair training;Functional mobility training;Therapeutic activities;Patient/family education    PT Goals (Current goals can be found in the Care Plan section)  Acute Rehab PT Goals Patient Stated Goal: spouse reports home with HHPT/OT PT Goal Formulation: With patient/family Time For Goal Achievement: 02/24/22 Potential to Achieve Goals: Good    Frequency Min 3X/week     Co-evaluation               AM-PAC  PT "6 Clicks" Mobility  Outcome Measure Help needed turning from your back to your side while in a flat bed without using bedrails?: A Little Help needed moving from lying on your back to sitting on the side of a flat bed without using bedrails?: A Lot Help needed moving to and from a bed to a chair (including a wheelchair)?: A Little Help needed standing up from a chair using your arms (e.g., wheelchair or bedside chair)?: A Little Help needed to walk in hospital room?: A Little Help needed climbing 3-5 steps with a railing? : A Lot 6 Click Score: 16    End of Session Equipment Utilized During Treatment: Gait belt Activity Tolerance: Patient tolerated treatment well Patient left: in bed;with call bell/phone within reach;with bed alarm set;with family/visitor present Nurse Communication: Mobility status PT Visit Diagnosis: Other abnormalities of gait and mobility (R26.89);Muscle weakness (generalized) (M62.81)    Time: 4403-4742 PT Time Calculation (min) (ACUTE ONLY): 23 min   Charges:   PT Evaluation $PT Eval Low Complexity: 1 Low PT Treatments $Gait Training: 8-22 mins        Abran Richard, PT Acute Rehab [facility] [place] 02/10/2022, 1:51 PM

## 2022-02-10 NOTE — TOC Initial Note (Signed)
Transition of Care [facility]) - Initial/Assessment Note    Patient Details  Name: Angel Green MRN: [id number] Date of Birth: [birth year]  [facility]) CM/SW Contact:    Bethena Roys, RN Phone Number: 02/10/2022, 12:59 PM  Clinical Narrative:   Risk for readmission assessment completed. PTA patient was from home with spouse and niece. Patient has 24 hour supervision in the home and she has DME cane and rolling walker. Case Manager spoke with spouse and he is agreeable to home health services. Medicare.gov list provided; spouse did not have a preference. Spouse is agreeable to [facility] submitted and start of care to begin within 24-48 hours post transition home. Spouse will provide transportation home. No further needs identified at this time.                 Planned Disposition: Home with Health Care Svc Barriers to Discharge: No Barriers Identified   Patient Goals and CMS Choice Patient states their goals for this hospitalization and ongoing recovery are:: spouse wants patient to return home.   Choice offered to / list presented to : Spouse      Expected Discharge Plan and Services Planned Disposition: Home with Health Care Svc In-house Referral: NA Discharge Planning Services: CM Consult Post Acute Care Choice: [place] arrangements for the past 2 months: [place] Expected Discharge Date: 02/10/22                 DME Agency: NA       HH Arranged: PT, OT HH Agency: [place] Date [facility] Agency Contacted: 02/10/22 Time HH Agency Contacted: 75 Representative spoke with at [place]: [place] Arrangements/Services Living arrangements for the past 2 months: [place] with:: Spouse, Relatives Patient language and need for interpreter reviewed:: Yes Do you feel safe going back to the place where you live?: Yes      Need for Family Participation in Patient Care: Yes (Comment) Care giver  support system in place?: Yes (comment) Current home services: DME (Patient has a rolling walker and cane in the home.) Criminal Activity/Legal Involvement Pertinent to Current Situation/Hospitalization: No - Comment as needed  Activities of Daily Living Home Assistive Devices/Equipment: Cane (specify quad or straight) ADL Screening (condition at time of admission) Patient's cognitive ability adequate to safely complete daily activities?: No Is the patient deaf or have difficulty hearing?: No Does the patient have difficulty seeing, even when wearing glasses/contacts?: No Does the patient have difficulty concentrating, remembering, or making decisions?: Yes Patient able to express need for assistance with ADLs?: No Does the patient have difficulty dressing or bathing?: Yes Independently performs ADLs?: No Communication: Needs assistance Dressing (OT): Needs assistance Is this a change from baseline?: Pre-admission baseline Grooming: Needs assistance Is this a change from baseline?: Pre-admission baseline Feeding: Needs assistance Is this a change from baseline?: Pre-admission baseline Bathing: Needs assistance Is this a change from baseline?: Pre-admission baseline Toileting: Needs assistance Is this a change from baseline?: Pre-admission baseline In/Out Bed: Needs assistance Is this a change from baseline?: Pre-admission baseline Walks in Home: Needs assistance Is this a change from baseline?: Pre-admission baseline Does the patient have difficulty walking or climbing stairs?: Yes Weakness of Legs: Both Weakness of Arms/Hands: Both  Permission Sought/Granted Permission sought to share information with : Family Supports, Case Manager Permission granted to share information with : Yes, Verbal Permission Granted     Permission granted to share info w AGENCY: [facility]  Emotional Assessment Appearance:: Appears stated age Attitude/Demeanor/Rapport: Unable to Assess Affect  (typically observed): Unable to Assess Orientation: : Oriented to Self Alcohol / Substance Use: Not Applicable Psych Involvement: No (comment)  Admission diagnosis:  Sepsis due to pneumonia ([place]) [J18.9, A41.9] Sepsis without acute organ dysfunction, due to unspecified organism [facility]) [A41.9] Patient Active Problem List   Diagnosis Date Noted   Sepsis due to pneumonia ([place]) 02/08/2022   Elevated troponin 02/08/2022   Nausea & vomiting 02/08/2022   Thyroid mass 02/08/2022   Ileus ([place]) 02/08/2022   Severe sepsis ([place]) 02/08/2022   Severe late onset Alzheimer's dementia ([place]) 10/21/2021   PAD (peripheral artery disease) ([place]) 04/21/2020   Numbness 03/26/2020   Dementia ([place]) 08/14/2017   Cognitive impairment 09/22/2016   Morbid obesity ([place]) 06/30/2015   Diabetes ([place]) 04/23/2015   PCP NOTES >>>>> 12/28/2014   Leg ulcer, left ([place]) 10/12/2013   Annual physical exam 12/14/2011   Angioedema 10/22/2011   Tracheobronchitis, chronic ([place]) 08/20/2010   Fatigue 05/22/2010   CKD (chronic kidney disease), stage IV ([place]) 04/16/2010   GERD 09/30/2008   EDEMA 06/10/2008   Hyperlipidemia 01/25/2007   ANKLE PAIN, CHRONIC 12/08/2006   Seasonal and perennial allergic rhinitis 10/20/2006   HTN (hypertension) 08/30/2006   OSTEOARTHROSIS, GENERALIZED, MULTIPLE SITES 08/30/2006   PCP:  Colon Branch, MD Pharmacy:   [facility], [place] [address] [place] [address] Phone: [phone] Fax: [phone]  [facility] - [place], [place] [address] Phone: [phone] Fax: [phone]  [facility] [place], [place] - [address] [place] [address] Phone: [phone] Fax: [phone]  [facility] [address] [place] [address] Phone: [phone] Fax:  [phone]  Social Determinants of Health (SDOH) Social History: [place]: No Food Insecurity (02/09/2022)  Housing: Low Risk  (02/08/2022)  Transportation Needs: No Transportation Needs (02/08/2022)  Utilities: Not At Risk (02/08/2022)  Alcohol Screen: Low Risk  (11/11/2017)  Depression (PHQ2-9): Low Risk  (01/12/2022)  Financial Resource Strain: Low Risk  (08/17/2021)  Tobacco Use: Low Risk  (02/08/2022)   Readmission Risk Interventions    02/10/2022   12:56 PM  Readmission Risk Prevention Plan  Transportation Screening Complete  PCP or Specialist Appt within 3-5 Days Complete  HRI or [place] Complete  Social Work Consult for [place] Planning/Counseling [place] Not Applicable  Medication Review Press photographer) Referral to Pharmacy

## 2022-02-11 ENCOUNTER — Telehealth: Payer: Self-pay | Admitting: *Deleted

## 2022-02-11 ENCOUNTER — Encounter: Payer: Self-pay | Admitting: *Deleted

## 2022-02-11 NOTE — Patient Outreach (Signed)
Care Coordination TOC Note Transition Care Management Follow-up Telephone Call Date of discharge and from where: Wednesday, 02/10/22 [place]; sepsis secondary to pneumonia; AKI How have you been since you were released from the hospital? Per husband/ caregiver [name], on [facility] DPR: "She is doing much better; we have everything under control between my niece and myself.  We make sure she is well taken care of.  I handle all of her medications and understand to hold the blood pressure medicine until we see Dr. Larose Kells.  I will call to schedule with Dr. Larose Kells tomorrow.  I have not yet heard from [facility] but I am sure they will call me soon-- yes, if you don't mind, please call them for me and make sure they know to come and see her.  We are checking her blood pressure and her blood sugar every day, we really don't have any concerns about anything right now" Any questions or concerns? No  -- Care Coordination outreach placed to [facility] [phone]; spoke with Whitney who confirms home health referral was received; she states someone should be contacting patient's spouse soon to arrange initial home visit -- Husband again declines offer of ongoing support with RN CM Care Coordinator -- Husband adamantly declines offer to assist with facilitation of scheduling hospital follow up PCP office visit- states he will make appointment himself tomorrow  Items Reviewed: Did the pt receive and understand the discharge instructions provided? Yes  Medications obtained and verified? No - spouse declines full medication review stating time constraints; verifies during call today that he has followed hospital discharge instructions around medications and is holding losartan as instructed, until hospital follow up PCP appointment Other? No  Any new allergies since your discharge? No  Dietary orders reviewed? No- husband declines Do you have support at home? Yes  husband and niece provide assistance with all care  needs as indicated  Home Care and Equipment/Supplies: Were home health services ordered? yes If so, what is the name of the agency? [facility]  Has the agency set up a time to come to the patient's home? No- see care coordination outreach as above Were any new equipment or medical supplies ordered?  No What is the name of the medical supply agency? N/A Were you able to get the supplies/equipment? not applicable Do you have any questions related to the use of the equipment or supplies? No  Functional Questionnaire: (I = Independent and D = Dependent) ADLs: D  husband and niece provide assistance with all care needs as indicated  Bathing/Dressing- D  Meal Prep- D  Eating- I  Maintaining continence- D  Transferring/Ambulation- D  Managing Meds- D husband manages all aspects of medication management  Follow up appointments reviewed:  PCP Hospital f/u appt confirmed? No  Scheduled to see - on - @ [facility] f/u appt confirmed? No  Scheduled to see - on - @ - Are transportation arrangements needed? No  If their condition worsens, is the pt aware to call PCP or go to the Emergency Dept.? Yes Was the patient provided with contact information for the PCP's office or ED? No- husband declines; reports already has contact information for all care providers Was to pt encouraged to call back with questions or concerns? Yes  SDOH assessments and interventions completed:   Yes SDOH Interventions Today    Flowsheet Row Most Recent Value  SDOH Interventions   Food Insecurity Interventions Intervention Not Indicated  Transportation Interventions Intervention Not Indicated  [husband  [name] provides transportation]      Care Coordination Interventions:  Care coordination outreach to home health agency to ensure active pending referral; briefly reviewed blood pressures and blood sugars at home with caregiver    Encounter Outcome:  Pt. Visit Completed    Oneta Rack, RN,  BSN, CCRN Alumnus RN CM Care Coordination/ Transition of [place] Management [phone]: direct office

## 2022-02-13 LAB — CULTURE, BLOOD (ROUTINE X 2)
Culture: NO GROWTH
Culture: NO GROWTH
Special Requests: ADEQUATE
Special Requests: ADEQUATE

## 2022-02-17 DIAGNOSIS — I700 Atherosclerosis of aorta: Secondary | ICD-10-CM | POA: Diagnosis not present

## 2022-02-17 DIAGNOSIS — E1122 Type 2 diabetes mellitus with diabetic chronic kidney disease: Secondary | ICD-10-CM | POA: Diagnosis not present

## 2022-02-17 DIAGNOSIS — G301 Alzheimer's disease with late onset: Secondary | ICD-10-CM | POA: Diagnosis not present

## 2022-02-17 DIAGNOSIS — M8580 Other specified disorders of bone density and structure, unspecified site: Secondary | ICD-10-CM | POA: Diagnosis not present

## 2022-02-17 DIAGNOSIS — Z794 Long term (current) use of insulin: Secondary | ICD-10-CM | POA: Diagnosis not present

## 2022-02-17 DIAGNOSIS — F05 Delirium due to known physiological condition: Secondary | ICD-10-CM | POA: Diagnosis not present

## 2022-02-17 DIAGNOSIS — F02C18 Dementia in other diseases classified elsewhere, severe, with other behavioral disturbance: Secondary | ICD-10-CM | POA: Diagnosis not present

## 2022-02-17 DIAGNOSIS — N184 Chronic kidney disease, stage 4 (severe): Secondary | ICD-10-CM | POA: Diagnosis not present

## 2022-02-17 DIAGNOSIS — A419 Sepsis, unspecified organism: Secondary | ICD-10-CM | POA: Diagnosis not present

## 2022-02-17 DIAGNOSIS — J181 Lobar pneumonia, unspecified organism: Secondary | ICD-10-CM | POA: Diagnosis not present

## 2022-02-17 DIAGNOSIS — N179 Acute kidney failure, unspecified: Secondary | ICD-10-CM | POA: Diagnosis not present

## 2022-02-17 DIAGNOSIS — Z9181 History of falling: Secondary | ICD-10-CM | POA: Diagnosis not present

## 2022-02-17 DIAGNOSIS — E0789 Other specified disorders of thyroid: Secondary | ICD-10-CM | POA: Diagnosis not present

## 2022-02-17 DIAGNOSIS — I1310 Hypertensive heart and chronic kidney disease without heart failure, with stage 1 through stage 4 chronic kidney disease, or unspecified chronic kidney disease: Secondary | ICD-10-CM | POA: Diagnosis not present

## 2022-02-17 DIAGNOSIS — Z7985 Long-term (current) use of injectable non-insulin antidiabetic drugs: Secondary | ICD-10-CM | POA: Diagnosis not present

## 2022-02-17 DIAGNOSIS — Z792 Long term (current) use of antibiotics: Secondary | ICD-10-CM | POA: Diagnosis not present

## 2022-02-17 DIAGNOSIS — Z7982 Long term (current) use of aspirin: Secondary | ICD-10-CM | POA: Diagnosis not present

## 2022-02-17 DIAGNOSIS — E1151 Type 2 diabetes mellitus with diabetic peripheral angiopathy without gangrene: Secondary | ICD-10-CM | POA: Diagnosis not present

## 2022-02-18 DIAGNOSIS — J181 Lobar pneumonia, unspecified organism: Secondary | ICD-10-CM | POA: Diagnosis not present

## 2022-02-18 DIAGNOSIS — I1310 Hypertensive heart and chronic kidney disease without heart failure, with stage 1 through stage 4 chronic kidney disease, or unspecified chronic kidney disease: Secondary | ICD-10-CM | POA: Diagnosis not present

## 2022-02-18 DIAGNOSIS — G301 Alzheimer's disease with late onset: Secondary | ICD-10-CM | POA: Diagnosis not present

## 2022-02-18 DIAGNOSIS — F02C18 Dementia in other diseases classified elsewhere, severe, with other behavioral disturbance: Secondary | ICD-10-CM | POA: Diagnosis not present

## 2022-02-18 DIAGNOSIS — F05 Delirium due to known physiological condition: Secondary | ICD-10-CM | POA: Diagnosis not present

## 2022-02-18 DIAGNOSIS — A419 Sepsis, unspecified organism: Secondary | ICD-10-CM | POA: Diagnosis not present

## 2022-02-21 ENCOUNTER — Other Ambulatory Visit: Payer: Self-pay | Admitting: Internal Medicine

## 2022-02-23 ENCOUNTER — Other Ambulatory Visit: Payer: Self-pay

## 2022-02-23 MED ORDER — QUETIAPINE FUMARATE 50 MG PO TABS: 50.0000 mg | ORAL_TABLET | Freq: Every day | ORAL | 1 refills | Status: DC

## 2022-02-25 DIAGNOSIS — A419 Sepsis, unspecified organism: Secondary | ICD-10-CM | POA: Diagnosis not present

## 2022-02-25 DIAGNOSIS — J181 Lobar pneumonia, unspecified organism: Secondary | ICD-10-CM | POA: Diagnosis not present

## 2022-02-25 DIAGNOSIS — F02C18 Dementia in other diseases classified elsewhere, severe, with other behavioral disturbance: Secondary | ICD-10-CM | POA: Diagnosis not present

## 2022-02-25 DIAGNOSIS — I1310 Hypertensive heart and chronic kidney disease without heart failure, with stage 1 through stage 4 chronic kidney disease, or unspecified chronic kidney disease: Secondary | ICD-10-CM | POA: Diagnosis not present

## 2022-02-25 DIAGNOSIS — G301 Alzheimer's disease with late onset: Secondary | ICD-10-CM | POA: Diagnosis not present

## 2022-02-25 DIAGNOSIS — F05 Delirium due to known physiological condition: Secondary | ICD-10-CM | POA: Diagnosis not present

## 2022-03-01 ENCOUNTER — Telehealth: Payer: Self-pay

## 2022-03-01 NOTE — Telephone Encounter (Signed)
Plan of care signed and faxed back to [facility] at [phone]. Form sent for scanning

## 2022-03-04 DIAGNOSIS — J181 Lobar pneumonia, unspecified organism: Secondary | ICD-10-CM | POA: Diagnosis not present

## 2022-03-04 DIAGNOSIS — A419 Sepsis, unspecified organism: Secondary | ICD-10-CM | POA: Diagnosis not present

## 2022-03-04 DIAGNOSIS — F05 Delirium due to known physiological condition: Secondary | ICD-10-CM | POA: Diagnosis not present

## 2022-03-04 DIAGNOSIS — F02C18 Dementia in other diseases classified elsewhere, severe, with other behavioral disturbance: Secondary | ICD-10-CM | POA: Diagnosis not present

## 2022-03-04 DIAGNOSIS — G301 Alzheimer's disease with late onset: Secondary | ICD-10-CM | POA: Diagnosis not present

## 2022-03-04 DIAGNOSIS — I1310 Hypertensive heart and chronic kidney disease without heart failure, with stage 1 through stage 4 chronic kidney disease, or unspecified chronic kidney disease: Secondary | ICD-10-CM | POA: Diagnosis not present

## 2022-03-05 ENCOUNTER — Ambulatory Visit (INDEPENDENT_AMBULATORY_CARE_PROVIDER_SITE_OTHER): Payer: Medicare Other | Admitting: Internal Medicine

## 2022-03-05 ENCOUNTER — Encounter: Payer: Self-pay | Admitting: Internal Medicine

## 2022-03-05 VITALS — BP 138/84 | HR 87 | Temp 98.0°F | Resp 18 | Ht 64.0 in | Wt 211.4 lb

## 2022-03-05 DIAGNOSIS — A419 Sepsis, unspecified organism: Secondary | ICD-10-CM | POA: Diagnosis not present

## 2022-03-05 DIAGNOSIS — N189 Chronic kidney disease, unspecified: Secondary | ICD-10-CM

## 2022-03-05 DIAGNOSIS — R6520 Severe sepsis without septic shock: Secondary | ICD-10-CM | POA: Diagnosis not present

## 2022-03-05 DIAGNOSIS — I10 Essential (primary) hypertension: Secondary | ICD-10-CM

## 2022-03-05 DIAGNOSIS — N179 Acute kidney failure, unspecified: Secondary | ICD-10-CM | POA: Diagnosis not present

## 2022-03-05 DIAGNOSIS — F02C0 Dementia in other diseases classified elsewhere, severe, without behavioral disturbance, psychotic disturbance, mood disturbance, and anxiety: Secondary | ICD-10-CM | POA: Diagnosis not present

## 2022-03-05 DIAGNOSIS — G301 Alzheimer's disease with late onset: Secondary | ICD-10-CM

## 2022-03-05 DIAGNOSIS — E079 Disorder of thyroid, unspecified: Secondary | ICD-10-CM

## 2022-03-05 MED ORDER — DONEPEZIL HCL 10 MG PO TABS: 10.0000 mg | ORAL_TABLET | Freq: Every day | ORAL | 0 refills | Status: DC

## 2022-03-05 MED ORDER — INSULIN LISPRO PROT & LISPRO (75-25 MIX) 100 UNIT/ML KWIKPEN: 50.0000 [IU] | PEN_INJECTOR | Freq: Every day | SUBCUTANEOUS | Status: DC

## 2022-03-05 NOTE — Progress Notes (Unsigned)
Subjective:    Patient ID: Angel Green, female    DOB: [birth year], 82 y.o.   MRN: [id number]  DOS:  03/05/2022 Type of visit - description: Hospital follow-up, TCM, here with her husband and another family member (niece)  Admitted to the hospital discharge 02/10/2022 Admitted to the ER with confusion, some respiratory distress.  She was diagnosed with the following:  Sepsis due to pneumonia: Chest x-ray was okay but a CT showed left lower lobe pneumonia.  Was DC on antibiotics which she already finished.  Ileus versus early SBO: Symptoms resolved with observation  Elevated troponin: Felt to be d/t and ischemia from septic, no chest pain.  Acute on chronic CKD, responded to IV fluids, holding losartan, needs BMP.  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  Ultrasound recommended.   Review of Systems Since she left the hospital she is back home.  Husband  helps with the medications. Finished antibiotics. Denies difficulty breathing or chest pain. No major cough. Appetite is not very good.  Had episode of diarrhea today but no previous problems. No nausea vomiting.  No blood in the stools.   Past Medical History:  Diagnosis Date   Allergy    Dust Mites   Ankle pain    chronic   Diabetes mellitus, type 2 ([place])    Hyperlipidemia    Hypertension    Memory loss    Osteoarthritis    RENAL INSUFFICIENCY, CHRONIC 04/16/2010   Rhinitis    allergic nos    Past Surgical History:  Procedure Laterality Date   CATARACT EXTRACTION     CHOLECYSTECTOMY     dental implants     PARTIAL HYSTERECTOMY     in her 30s    Current Outpatient Medications  Medication Instructions   amLODipine (NORVASC) 5 mg, Oral, Daily   aspirin 81 mg, Daily   atorvastatin (LIPITOR) 10 mg, Oral, Daily   azelastine (ASTELIN) 0.1 % nasal spray Each Nare, 2 times daily, Use in each nostril as directed   azithromycin (ZITHROMAX) 250 mg, Oral, Daily   Blood Glucose  Monitoring Suppl (FREESTYLE LITE) DEVI Use to monitor your blood sugars   cefpodoxime (VANTIN) 200 MG tablet Take 1/2 tablet (100 mg total) by mouth 2 (two) times daily.   Continuous Blood Gluc Sensor (FREESTYLE LIBRE 2 SENSOR) MISC 1 Device, Does not apply, Every 14 days   Cyanocobalamin (B-12 PO) 1 tablet, Oral, Daily, Takes 5000 mcg by mouth daily.   donepezil (ARICEPT) 10 mg, Oral, Daily at bedtime   Dulaglutide 4.5 mg, Subcutaneous, Weekly   DULoxetine (CYMBALTA) 20 mg, Oral, Daily   EPINEPHrine (EPI-PEN) 0.3 mg,  Once   furosemide (LASIX) 40 mg, Oral, Daily   glucose blood (FREESTYLE LITE) test strip Use to monitor your blood sugars 4 times per day; E11.9   insulin lispro (HUMALOG KWIKPEN) 100 UNIT/ML KwikPen 5-10 units before meals   Insulin Lispro Prot & Lispro (HUMALOG MIX 75/25 KWIKPEN) (75-25) 100 UNIT/ML Kwikpen 60 Units, Subcutaneous, Daily with breakfast, And pen needles 1/day   Lancets (FREESTYLE) lancets Use to monitor blood sugars 4 times per day, once before each meal and once at bedtime   loratadine (CLARITIN) 10 MG tablet TAKE 1 TABLET DAILY   memantine (NAMENDA) 10 mg, Oral, 2 times daily   montelukast (SINGULAIR) 10 mg, Oral, Daily at bedtime   Multiple Vitamins-Minerals (CENTRUM SILVER PO) 1 tablet, Oral, Daily,     Omega-3 Fatty Acids (FISH OIL  PO) 1 capsule, Oral, Daily   potassium chloride (KLOR-CON) 10 MEQ tablet 10 mEq, Oral, Daily   QUEtiapine (SEROQUEL) 50 mg, Oral, Daily at bedtime   SURE COMFORT PEN NEEDLES 31G X 5 MM MISC Use to inject insulin 1 time per day.       Objective:   Physical Exam BP 138/84   Pulse 87   Temp 98 F (36.7 C) (Oral)   Resp 18   Ht '5\' 4"'$  (1.626 m)   Wt 211 lb 6 oz (95.9 kg)   SpO2 94%   BMI 36.28 kg/m  General:   Well developed, NAD, BMI noted.  HEENT:  Normocephalic . Face symmetric, atraumatic Lungs:  CTA B Normal respiratory effort, no intercostal retractions, no accessory muscle use. Heart: RRR,  no murmur.   Abdomen:  Not distended, soft, non-tender. No rebound or rigidity.   Skin: Not pale. Not jaundice Lower extremities: no pretibial edema bilaterally  Neurologic:  alert pleasantly demented, not very interactive.  Gait not tested. Psych--  Behavior appropriate. No anxious or depressed appearing.     Assessment     Assessment   DM  Dr Loanne Drilling-- [name] + Neuropathy: Per foot exam 12-2014 HTN ---change ACE to ARB is 03/2014 Hyperlipidemia CRI  dx 2012  Sees Dr Arty Baumgartner  Morbid obesity DJD, had  chronic ankle pain Allergies -- dust mites , occ uses a inhaler , sees allergist, has shots q weeks started ~ 08-2014 Dementia:  MMSE 7/218: 24, rx Aricept.  B12, RPR, sed rate and TSH normal MMSE 07/2017 :13 worse MRI brain 6/19:  prominent right temporal lobe volume loss.  PLAN Hospital follow-up, TCM 14 Medication reconciliation: Not taking losartan as recommended by hospital team Not taking Humalog due to low blood sugar Humalog 75/25: Taking only 50 units daily Family holding aspirin (reason?). OK to go back Sepsis due to pneumonia: Admitted to the hospital with mental status changes and chest pain, workup show pneumonia on CT, not on x-ray Treated with IV antibiotics, she finished follow-up oral antibiotics already and clinically seems to be better with no fever, cough or SOB Plan: BMP and CBC. Acute on chronic CKD: At the hospital, creatinine was mildly increased from baseline, upon discharge it was 1.3.  Rechecking today. HTN: Currently on amlodipine, Lasix, potassium.  Checking labs, losartan stopped at the hospital due to increased creatinine.  Consider restart if appropriate. DM: The patient has freestyle CGM, getting readings in the 60s, whenever the check fingerstick is typically 40 or 50 points higher.  Recommend: Replace CGM, continue Humalog 75/25 50 units daily, continue holding Humalog for now, call with CBGs in 1 week.  Follow-up with Endo in few weeks as planned. Poor  appetite: Related to dulaglutide? Pt's niece e wonders if that should be stopped.  The husband is in favor to continue.  Recommend to discuss with Endo.   Incidental thyroid mass:  finding discussed, could be something malignant, this was discussed with husband  and he is very clear that he does not like to proceed with further evaluation, he will not allow the biopsy or surgery.  I agree and understand. RTC 2 months

## 2022-03-05 NOTE — Patient Instructions (Addendum)
Place a brand-new freestyle libre  Continue checking fingerstick blood sugars  Call with your blood sugar readings in 4 to 5 days.  Continue holding Humalog 3 times a day  Restart aspirin  Please read the medication list carefully  For diarrhea: Get a probiotic over-the-counter.  If she continue with diarrhea, or develop stomach pain or  blood in the stools : call the office  Check the  blood pressure regularly BP GOAL is between 110/65 and  135/85. If it is consistently higher or lower, let me know    GO TO THE LAB : Get the blood work     [place], [place] back for a checkup in 2 months  STOP BY THE FIRST FLOOR:  get the XR          Per our records you are due for your diabetic eye exam. Please contact your eye doctor to schedule an appointment. Please have them send copies of your office visit notes to [place]. Our fax number is [phone]. If you need a referral to an eye doctor please let us know.

## 2022-03-06 LAB — CBC WITH DIFFERENTIAL/PLATELET
Absolute Monocytes: 420 cells/uL (ref 200–950)
Basophils Absolute: 63 cells/uL (ref 0–200)
Basophils Relative: 0.9 %
Eosinophils Absolute: 203 cells/uL (ref 15–500)
Eosinophils Relative: 2.9 %
HCT: 42 % (ref 35.0–45.0)
Hemoglobin: 14.1 g/dL (ref 11.7–15.5)
Lymphs Abs: 2415 cells/uL (ref 850–3900)
MCH: 31.2 pg (ref 27.0–33.0)
MCHC: 33.6 g/dL (ref 32.0–36.0)
MCV: 92.9 fL (ref 80.0–100.0)
MPV: 10.4 fL (ref 7.5–12.5)
Monocytes Relative: 6 %
Neutro Abs: 3899 cells/uL (ref 1500–7800)
Neutrophils Relative %: 55.7 %
Platelets: 267 10*3/uL (ref 140–400)
RBC: 4.52 10*6/uL (ref 3.80–5.10)
RDW: 13.5 % (ref 11.0–15.0)
Total Lymphocyte: 34.5 %
WBC: 7 10*3/uL (ref 3.8–10.8)

## 2022-03-06 LAB — BASIC METABOLIC PANEL
BUN/Creatinine Ratio: 8 (calc) (ref 6–22)
BUN: 18 mg/dL (ref 7–25)
CO2: 24 mmol/L (ref 20–32)
Calcium: 9.8 mg/dL (ref 8.6–10.4)
Chloride: 106 mmol/L (ref 98–110)
Creat: 2.16 mg/dL — ABNORMAL HIGH (ref 0.60–0.95)
Glucose, Bld: 76 mg/dL (ref 65–99)
Potassium: 3.4 mmol/L — ABNORMAL LOW (ref 3.5–5.3)
Sodium: 145 mmol/L (ref 135–146)

## 2022-03-06 NOTE — Assessment & Plan Note (Signed)
Hospital follow-up, TCM 14 Medication reconciliation: Not taking losartan as recommended by hospital team Not taking Humalog due to low blood sugar Humalog 75/25: Taking only 50 units daily Family holding aspirin (reason?). OK to go back Sepsis due to pneumonia: Admitted to the hospital with mental status changes and chest pain, workup show pneumonia on CT, not on x-ray Treated with IV antibiotics, she finished follow-up oral antibiotics already and clinically seems to be better with no fever, cough or SOB Plan: BMP and CBC. Acute on chronic CKD: At the hospital, creatinine was mildly increased from baseline, upon discharge it was 1.3.  Rechecking today. HTN: Currently on amlodipine, Lasix, potassium.  Checking labs, losartan stopped at the hospital due to increased creatinine.  Consider restart if appropriate. DM: The patient has freestyle CGM, getting readings in the 60s, whenever the check fingerstick is typically 40 or 50 points higher.  Recommend: Replace CGM, continue Humalog 75/25 50 units daily, continue holding Humalog for now, call with CBGs in 1 week.  Follow-up with Endo in few weeks as planned. Poor appetite: Related to dulaglutide? Pt's niece e wonders if that should be stopped.  The husband is in favor to continue.  Recommend to discuss with Endo.   Incidental thyroid mass:  finding discussed, could be something malignant, this was discussed with husband  and he is very clear that he does not like to proceed with further evaluation, he will not allow the biopsy or surgery.  I agree and understand. RTC 2 months

## 2022-03-08 DIAGNOSIS — F05 Delirium due to known physiological condition: Secondary | ICD-10-CM | POA: Diagnosis not present

## 2022-03-08 DIAGNOSIS — J181 Lobar pneumonia, unspecified organism: Secondary | ICD-10-CM | POA: Diagnosis not present

## 2022-03-08 DIAGNOSIS — F02C18 Dementia in other diseases classified elsewhere, severe, with other behavioral disturbance: Secondary | ICD-10-CM | POA: Diagnosis not present

## 2022-03-08 DIAGNOSIS — I1310 Hypertensive heart and chronic kidney disease without heart failure, with stage 1 through stage 4 chronic kidney disease, or unspecified chronic kidney disease: Secondary | ICD-10-CM | POA: Diagnosis not present

## 2022-03-08 DIAGNOSIS — A419 Sepsis, unspecified organism: Secondary | ICD-10-CM | POA: Diagnosis not present

## 2022-03-08 DIAGNOSIS — G301 Alzheimer's disease with late onset: Secondary | ICD-10-CM | POA: Diagnosis not present

## 2022-03-09 ENCOUNTER — Telehealth: Payer: Self-pay | Admitting: Internal Medicine

## 2022-03-09 ENCOUNTER — Other Ambulatory Visit: Payer: Self-pay

## 2022-03-09 DIAGNOSIS — E876 Hypokalemia: Secondary | ICD-10-CM

## 2022-03-09 MED ORDER — QUETIAPINE FUMARATE 50 MG PO TABS: 50.0000 mg | ORAL_TABLET | Freq: Every day | ORAL | 1 refills | Status: DC

## 2022-03-09 MED ORDER — POTASSIUM CHLORIDE ER 10 MEQ PO TBCR: 10.0000 meq | EXTENDED_RELEASE_TABLET | Freq: Every day | ORAL | 1 refills | Status: DC

## 2022-03-09 MED ORDER — QUETIAPINE FUMARATE 50 MG PO TABS: 50.0000 mg | ORAL_TABLET | Freq: Every day | ORAL | 0 refills | Status: DC

## 2022-03-09 NOTE — Telephone Encounter (Signed)
Rx sent 

## 2022-03-09 NOTE — Telephone Encounter (Signed)
Prescription Request  03/09/2022  Is this a "Controlled Substance" medicine? No  LOV: 03/05/2022  What is the name of the medication or equipment?  potassium chloride (KLOR-CON) 10 MEQ table    Have you contacted your pharmacy to request a refill? No   Which pharmacy would you like this sent to?    [facility] - [place], [place] [address] Phone: [phone] Fax: [phone]    Patient notified that their request is being sent to the clinical staff for review and that they should receive a response within 2 business days.   Please advise at Mobile [phone] (mobile)

## 2022-03-09 NOTE — Addendum Note (Signed)
Addended byDamita Dunnings D on: 03/09/2022 01:48 PM   Modules accepted: Orders

## 2022-03-10 ENCOUNTER — Telehealth: Payer: Self-pay

## 2022-03-10 NOTE — Telephone Encounter (Signed)
Husband called in states that Angel Green is ready about 20-30 points lower than her actual fingerstick. Please advise.

## 2022-03-11 ENCOUNTER — Other Ambulatory Visit: Payer: Self-pay

## 2022-03-11 DIAGNOSIS — E1165 Type 2 diabetes mellitus with hyperglycemia: Secondary | ICD-10-CM

## 2022-03-11 MED ORDER — FREESTYLE LIBRE 2 SENSOR MISC: 1.0000 | 3 refills | Status: AC

## 2022-03-11 NOTE — Telephone Encounter (Signed)
Spoke with husband. They will stay with Elenor Legato at this time. Sent Rx for more refills for patient.

## 2022-03-12 ENCOUNTER — Ambulatory Visit
Admission: RE | Admit: 2022-03-12 | Discharge: 2022-03-12 | Disposition: A | Discharge status: Home / Self Care | Payer: Medicare Other | Source: Ambulatory Visit | Attending: Internal Medicine | Admitting: Internal Medicine

## 2022-03-12 DIAGNOSIS — Z1231 Encounter for screening mammogram for malignant neoplasm of breast: Secondary | ICD-10-CM

## 2022-03-15 ENCOUNTER — Telehealth: Payer: Self-pay | Admitting: Internal Medicine

## 2022-03-15 DIAGNOSIS — F02C18 Dementia in other diseases classified elsewhere, severe, with other behavioral disturbance: Secondary | ICD-10-CM | POA: Diagnosis not present

## 2022-03-15 DIAGNOSIS — F05 Delirium due to known physiological condition: Secondary | ICD-10-CM | POA: Diagnosis not present

## 2022-03-15 DIAGNOSIS — A419 Sepsis, unspecified organism: Secondary | ICD-10-CM | POA: Diagnosis not present

## 2022-03-15 DIAGNOSIS — J181 Lobar pneumonia, unspecified organism: Secondary | ICD-10-CM | POA: Diagnosis not present

## 2022-03-15 DIAGNOSIS — I1310 Hypertensive heart and chronic kidney disease without heart failure, with stage 1 through stage 4 chronic kidney disease, or unspecified chronic kidney disease: Secondary | ICD-10-CM | POA: Diagnosis not present

## 2022-03-15 DIAGNOSIS — G301 Alzheimer's disease with late onset: Secondary | ICD-10-CM | POA: Diagnosis not present

## 2022-03-15 NOTE — Telephone Encounter (Signed)
Noted  

## 2022-03-15 NOTE — Telephone Encounter (Signed)
Amy from [facility] just wanted to advise pt missed her visit last week and they will be r/s to this week.

## 2022-03-18 DIAGNOSIS — A419 Sepsis, unspecified organism: Secondary | ICD-10-CM | POA: Diagnosis not present

## 2022-03-18 DIAGNOSIS — J181 Lobar pneumonia, unspecified organism: Secondary | ICD-10-CM | POA: Diagnosis not present

## 2022-03-18 DIAGNOSIS — G301 Alzheimer's disease with late onset: Secondary | ICD-10-CM | POA: Diagnosis not present

## 2022-03-18 DIAGNOSIS — F02C18 Dementia in other diseases classified elsewhere, severe, with other behavioral disturbance: Secondary | ICD-10-CM | POA: Diagnosis not present

## 2022-03-18 DIAGNOSIS — I1310 Hypertensive heart and chronic kidney disease without heart failure, with stage 1 through stage 4 chronic kidney disease, or unspecified chronic kidney disease: Secondary | ICD-10-CM | POA: Diagnosis not present

## 2022-03-18 DIAGNOSIS — F05 Delirium due to known physiological condition: Secondary | ICD-10-CM | POA: Diagnosis not present

## 2022-03-19 DIAGNOSIS — F02C18 Dementia in other diseases classified elsewhere, severe, with other behavioral disturbance: Secondary | ICD-10-CM | POA: Diagnosis not present

## 2022-03-19 DIAGNOSIS — M8580 Other specified disorders of bone density and structure, unspecified site: Secondary | ICD-10-CM | POA: Diagnosis not present

## 2022-03-19 DIAGNOSIS — Z7982 Long term (current) use of aspirin: Secondary | ICD-10-CM | POA: Diagnosis not present

## 2022-03-19 DIAGNOSIS — I1310 Hypertensive heart and chronic kidney disease without heart failure, with stage 1 through stage 4 chronic kidney disease, or unspecified chronic kidney disease: Secondary | ICD-10-CM | POA: Diagnosis not present

## 2022-03-19 DIAGNOSIS — A419 Sepsis, unspecified organism: Secondary | ICD-10-CM | POA: Diagnosis not present

## 2022-03-19 DIAGNOSIS — Z9181 History of falling: Secondary | ICD-10-CM | POA: Diagnosis not present

## 2022-03-19 DIAGNOSIS — Z792 Long term (current) use of antibiotics: Secondary | ICD-10-CM | POA: Diagnosis not present

## 2022-03-19 DIAGNOSIS — E0789 Other specified disorders of thyroid: Secondary | ICD-10-CM | POA: Diagnosis not present

## 2022-03-19 DIAGNOSIS — J181 Lobar pneumonia, unspecified organism: Secondary | ICD-10-CM | POA: Diagnosis not present

## 2022-03-19 DIAGNOSIS — I700 Atherosclerosis of aorta: Secondary | ICD-10-CM | POA: Diagnosis not present

## 2022-03-19 DIAGNOSIS — N179 Acute kidney failure, unspecified: Secondary | ICD-10-CM | POA: Diagnosis not present

## 2022-03-19 DIAGNOSIS — E1122 Type 2 diabetes mellitus with diabetic chronic kidney disease: Secondary | ICD-10-CM | POA: Diagnosis not present

## 2022-03-19 DIAGNOSIS — N184 Chronic kidney disease, stage 4 (severe): Secondary | ICD-10-CM | POA: Diagnosis not present

## 2022-03-19 DIAGNOSIS — G301 Alzheimer's disease with late onset: Secondary | ICD-10-CM | POA: Diagnosis not present

## 2022-03-19 DIAGNOSIS — Z7985 Long-term (current) use of injectable non-insulin antidiabetic drugs: Secondary | ICD-10-CM | POA: Diagnosis not present

## 2022-03-19 DIAGNOSIS — F05 Delirium due to known physiological condition: Secondary | ICD-10-CM | POA: Diagnosis not present

## 2022-03-19 DIAGNOSIS — Z794 Long term (current) use of insulin: Secondary | ICD-10-CM | POA: Diagnosis not present

## 2022-03-19 DIAGNOSIS — E1151 Type 2 diabetes mellitus with diabetic peripheral angiopathy without gangrene: Secondary | ICD-10-CM | POA: Diagnosis not present

## 2022-03-22 ENCOUNTER — Other Ambulatory Visit: Payer: Self-pay | Admitting: Internal Medicine

## 2022-03-23 DIAGNOSIS — A419 Sepsis, unspecified organism: Secondary | ICD-10-CM | POA: Diagnosis not present

## 2022-03-23 DIAGNOSIS — G301 Alzheimer's disease with late onset: Secondary | ICD-10-CM | POA: Diagnosis not present

## 2022-03-23 DIAGNOSIS — F05 Delirium due to known physiological condition: Secondary | ICD-10-CM | POA: Diagnosis not present

## 2022-03-23 DIAGNOSIS — I1310 Hypertensive heart and chronic kidney disease without heart failure, with stage 1 through stage 4 chronic kidney disease, or unspecified chronic kidney disease: Secondary | ICD-10-CM | POA: Diagnosis not present

## 2022-03-23 DIAGNOSIS — J181 Lobar pneumonia, unspecified organism: Secondary | ICD-10-CM | POA: Diagnosis not present

## 2022-03-23 DIAGNOSIS — F02C18 Dementia in other diseases classified elsewhere, severe, with other behavioral disturbance: Secondary | ICD-10-CM | POA: Diagnosis not present

## 2022-03-24 ENCOUNTER — Ambulatory Visit: Payer: Medicare Other | Admitting: Dermatology

## 2022-03-26 ENCOUNTER — Other Ambulatory Visit (HOSPITAL_COMMUNITY): Payer: Self-pay

## 2022-04-01 ENCOUNTER — Telehealth: Payer: Self-pay | Admitting: Pharmacy Technician

## 2022-04-01 ENCOUNTER — Other Ambulatory Visit (HOSPITAL_COMMUNITY): Payer: Self-pay

## 2022-04-01 NOTE — Telephone Encounter (Signed)
Pharmacy Patient Advocate Encounter   Received notification from [facility] (fax) that prior authorization for [facility] is required/requested.   PA submitted on 04/01/22 to (ins) [facility]([employer]) via Phone  Case ID # : [id number] Faxed supporting documentation to [phone]

## 2022-04-02 ENCOUNTER — Other Ambulatory Visit: Payer: Self-pay

## 2022-04-02 ENCOUNTER — Telehealth: Payer: Self-pay | Admitting: Internal Medicine

## 2022-04-02 MED ORDER — ATORVASTATIN CALCIUM 10 MG PO TABS: 10.0000 mg | ORAL_TABLET | Freq: Every day | ORAL | 1 refills | Status: DC

## 2022-04-02 NOTE — Telephone Encounter (Signed)
Noted  

## 2022-04-02 NOTE — Telephone Encounter (Signed)
Angel Green just wanted to advise they were supposed to be discharging pt from PT but have not been able to reach. They are planning to discharge next week.

## 2022-04-02 NOTE — Telephone Encounter (Signed)
FYI

## 2022-04-06 ENCOUNTER — Telehealth: Payer: Self-pay

## 2022-04-06 ENCOUNTER — Other Ambulatory Visit (INDEPENDENT_AMBULATORY_CARE_PROVIDER_SITE_OTHER): Payer: Medicare Other

## 2022-04-06 DIAGNOSIS — E1165 Type 2 diabetes mellitus with hyperglycemia: Secondary | ICD-10-CM

## 2022-04-06 DIAGNOSIS — E876 Hypokalemia: Secondary | ICD-10-CM

## 2022-04-06 DIAGNOSIS — Z794 Long term (current) use of insulin: Secondary | ICD-10-CM

## 2022-04-06 LAB — HEMOGLOBIN A1C: Hgb A1c MFr Bld: 6.4 % (ref 4.6–6.5)

## 2022-04-06 LAB — BASIC METABOLIC PANEL
BUN: 11 mg/dL (ref 6–23)
CO2: 30 mEq/L (ref 19–32)
Calcium: 9.9 mg/dL (ref 8.4–10.5)
Chloride: 103 mEq/L (ref 96–112)
Creatinine, Ser: 1.6 mg/dL — ABNORMAL HIGH (ref 0.40–1.20)
GFR: 29.53 mL/min — ABNORMAL LOW (ref >60.00)
Glucose, Bld: 104 mg/dL — ABNORMAL HIGH (ref 70–99)
Potassium: 3.1 mEq/L — ABNORMAL LOW (ref 3.5–5.1)
Sodium: 142 mEq/L (ref 135–145)

## 2022-04-06 NOTE — Telephone Encounter (Signed)
Physician orders received from [facility]- form signed and faxed back to [phone]. Form sent for scanning.

## 2022-04-07 ENCOUNTER — Other Ambulatory Visit: Payer: Self-pay

## 2022-04-07 DIAGNOSIS — E876 Hypokalemia: Secondary | ICD-10-CM

## 2022-04-07 MED ORDER — POTASSIUM CHLORIDE ER 10 MEQ PO TBCR: 10.0000 meq | EXTENDED_RELEASE_TABLET | Freq: Every day | ORAL | 1 refills | Status: DC

## 2022-04-08 ENCOUNTER — Telehealth: Payer: Self-pay | Admitting: Internal Medicine

## 2022-04-08 NOTE — Telephone Encounter (Signed)
Spoke w/ Amy- verbal orders given.

## 2022-04-08 NOTE — Telephone Encounter (Signed)
Amy from [facility] was not able to discharge pt from PT today because she was not able to get out of bed in time for the appt. She just needs orders to r/s to next week.

## 2022-04-09 ENCOUNTER — Telehealth: Payer: Self-pay

## 2022-04-09 ENCOUNTER — Ambulatory Visit (INDEPENDENT_AMBULATORY_CARE_PROVIDER_SITE_OTHER): Payer: Medicare Other | Admitting: Endocrinology

## 2022-04-09 ENCOUNTER — Encounter: Payer: Self-pay | Admitting: Endocrinology

## 2022-04-09 ENCOUNTER — Other Ambulatory Visit: Payer: Medicare Other

## 2022-04-09 VITALS — BP 122/74 | HR 70 | Ht 64.0 in | Wt 216.6 lb

## 2022-04-09 DIAGNOSIS — Z794 Long term (current) use of insulin: Secondary | ICD-10-CM

## 2022-04-09 DIAGNOSIS — E1165 Type 2 diabetes mellitus with hyperglycemia: Secondary | ICD-10-CM

## 2022-04-09 DIAGNOSIS — E041 Nontoxic single thyroid nodule: Secondary | ICD-10-CM | POA: Diagnosis not present

## 2022-04-09 LAB — MICROALBUMIN / CREATININE URINE RATIO
Creatinine,U: 95 mg/dL
Microalb Creat Ratio: 0.7 mg/g (ref 0.0–30.0)
Microalb, Ur: <0.7 mg/dL (ref 0.0–1.9)

## 2022-04-09 MED ORDER — POTASSIUM CHLORIDE CRYS ER 10 MEQ PO TBCR: 20.0000 meq | EXTENDED_RELEASE_TABLET | Freq: Every day | ORAL | 0 refills | Status: DC

## 2022-04-09 MED ORDER — TRULICITY 0.75 MG/0.5ML ~~LOC~~ SOAJ: SUBCUTANEOUS | 0 refills | Status: DC

## 2022-04-09 NOTE — Progress Notes (Signed)
Patient ID: Angel Green, female   DOB: 1940-04-20, 82 y.o.   MRN: [id number]           Reason for Appointment: Type II Diabetes follow-up   History of Present Illness   Diagnosis date: 1999  Previous history:  Non-insulin hypoglycemic drugs previously used: Trulicity was started in April 2019 and she has been on 4.5 mg since 2021 Insulin was started in 2010  A1c range in the last few years is: 6.1-9.1  Recent history:     Non-insulin hypoglycemic drugs: Trulicity 4.5 mg weekly     Insulin regimen: Humalog mix 50 units with breakfast            Side effects from medications: None  Current self management, blood sugar patterns and problems identified:  A1c is 6.4 compared to 7.5 She did continue using the freestyle libre sensor monitor apparently most of the time this appears to be reading falsely low Also she is checking her sugars only part of the time with 56% active CGM time recently Her daughter is checking blood sugars with a fingerstick also on her reader and the blood sugars are frequently 20 to 40 mg lower than the actual reading She appears to have periodic hypoglycemia overnight although likely asymptomatic With not monitoring blood sugars in the evenings blood sugars cannot be assessed after meals but usually do not appear to be going over 180 On her last visit she was recommended Humalog at suppertime but because of relatively low sugars this was stopped by PCP In the last 3 to 4 weeks she has not been able to get Trulicity from the mail order company However with-sepsis in December she has lost weight and only recently starting to eat more normally Lab glucose 104 Not able to do any walking for exercise  Exercise: none  Diet management: Eating 3 meals a day, no specific meal plan Her breakfast usually consists of 1 pancake, sausage Dinner usually 6-7 PM Last nutritional counseling 8/23  Interpretation of the freestyle libre version 2 sensor download as  follows  Blood sugars are overnight the periodically below normal and generally appear to be averaging only in the 70s and 80s through the night.  Blood sugars may stay normal for several hours Blood sugars are gradually increasing in the afternoon but data is incomplete after about 6 PM  Postprandial reading difficult to assess but she will have readings going up to as much is 180 after lunch or dinner when assessed, however overall average blood sugars are generally below 140  No hypoglycemia in the afternoons usually AVERAGE overall 89 She has 29% of blood sugars below 70 and the rest within target range at 71%  Previous data:  CGM use % of time 62  2-week average/GV   Time in range        77%  % Time Above 180 19  % Time above 250 2  % Time Below 70 2     PRE-MEAL Fasting Lunch Dinner Bedtime Overall  Glucose range:       Averages: 102       POST-MEAL PC Breakfast PC Lunch PC Dinner  Glucose range:     Averages: 192  167    Weight control:  Wt Readings from Last 3 Encounters:  04/09/22 216 lb 9.6 oz (98.2 kg)  03/05/22 211 lb 6 oz (95.9 kg)  02/10/22 225 lb 1.6 oz (102.1 kg)            Diabetes labs:  Lab Results  Component Value Date   HGBA1C 6.4 04/06/2022   HGBA1C 7.9 (H) 12/18/2021   HGBA1C 7.5 (H) 09/18/2021   Lab Results  Component Value Date   MICROALBUR <0.7 09/21/2021   LDLCALC 76 09/07/2021   CREATININE 1.60 (H) 04/06/2022    Lab Results  Component Value Date   FRUCTOSAMINE 299 (H) 06/26/2020   FRUCTOSAMINE 370 (H) 04/22/2015     Allergies as of 04/09/2022       Reactions   Dust Mite Mixed Allergen Ext [mite (d. Farinae)]         Medication List        Accurate as of April 09, 2022  2:48 PM. If you have any questions, ask your nurse or doctor.          STOP taking these medications    Dulaglutide 4.5 MG/0.5ML Sopn Replaced by: Trulicity [id number] 0000000 Sopn Stopped by: Elayne Snare, MD   insulin lispro 100 UNIT/ML  KwikPen Commonly known as: HumaLOG KwikPen Stopped by: Elayne Snare, MD       TAKE these medications    amLODipine 5 MG tablet Commonly known as: NORVASC Take 1 tablet (5 mg total) by mouth daily.   aspirin 81 MG chewable tablet Chew 81 mg by mouth daily.   atorvastatin 10 MG tablet Commonly known as: LIPITOR Take 1 tablet (10 mg total) by mouth daily.   azelastine 0.1 % nasal spray Commonly known as: ASTELIN Place into both nostrils 2 (two) times daily. Use in each nostril as directed   B-12 PO Take 1 tablet by mouth daily. Takes 5000 mcg by mouth daily.   CENTRUM SILVER PO Take 1 tablet by mouth daily.   donepezil 10 MG tablet Commonly known as: ARICEPT Take 1 tablet (10 mg total) by mouth at bedtime.   DULoxetine 20 MG capsule Commonly known as: CYMBALTA Take 20 mg by mouth daily.   EPINEPHrine 0.3 mg/0.3 mL Soaj injection Commonly known as: EPI-PEN Inject 0.3 mg into the muscle once. Reported on 06/30/2015   FISH OIL PO Take 1 capsule by mouth daily in the afternoon.   freestyle lancets Use to monitor blood sugars 4 times per day, once before each meal and once at bedtime   FreeStyle Libre 2 Sensor Misc 1 Device by Does not apply route every 14 (fourteen) days.   FreeStyle Lite Devi Use to monitor your blood sugars   FREESTYLE LITE test strip Generic drug: glucose blood Use to monitor your blood sugars 4 times per day; E11.9   furosemide 20 MG tablet Commonly known as: LASIX Take 2 tablets (40 mg total) by mouth daily.   Insulin Lispro Prot & Lispro (75-25) 100 UNIT/ML Kwikpen Commonly known as: HumaLOG Mix 75/25 KwikPen Inject 50 Units into the skin daily with breakfast. And pen needles 1/day   loratadine 10 MG tablet Commonly known as: CLARITIN TAKE 1 TABLET DAILY   memantine 10 MG tablet Commonly known as: NAMENDA Take 1 tablet (10 mg total) by mouth 2 (two) times daily.   montelukast 10 MG tablet Commonly known as: SINGULAIR Take 10 mg  by mouth at bedtime.   potassium chloride 10 MEQ tablet Commonly known as: KLOR-CON M Take 2 tablets (20 mEq total) by mouth daily.   QUEtiapine 50 MG tablet Commonly known as: SEROQUEL Take 1 tablet (50 mg total) by mouth at bedtime.   Sure Comfort Pen Needles 31G X 5 MM Misc Generic drug: Insulin Pen Needle Use to inject insulin 1  time per day.   Trulicity A999333 0000000 Sopn Generic drug: Dulaglutide Inject in the abdominal skin as directed once a week Replaces: Dulaglutide 4.5 MG/0.5ML Sopn Started by: Elayne Snare, MD        Allergies:  Allergies  Allergen Reactions   Dust Mite Mixed Allergen Ext [Mite (D. Farinae)]     Past Medical History:  Diagnosis Date   Allergy    Dust Mites   Ankle pain    chronic   Diabetes mellitus, type 2 ([place])    Hyperlipidemia    Hypertension    Memory loss    Osteoarthritis    RENAL INSUFFICIENCY, CHRONIC 04/16/2010   Rhinitis    allergic nos    Past Surgical History:  Procedure Laterality Date   CATARACT EXTRACTION     CHOLECYSTECTOMY     dental implants     PARTIAL HYSTERECTOMY     in her 53s    Family History  Problem Relation Age of Onset   Healthy Brother    Healthy Son    Healthy Son    Other Mother        unsure of history - "old age"   Other Father        unsure of history - "old age"   Heart attack Neg Hx    Colon cancer Neg Hx    Breast cancer Neg Hx    Stroke Neg Hx     Social History:  reports that she has never smoked. She has never used smokeless tobacco. She reports that she does not drink alcohol and does not use drugs.  Review of Systems:  Last diabetic eye exam date 10/21, reportedly no retinopathy  Last foot exam date: 9/22  Hypertension:   Treatment includes losartan 50 mg daily  BP Readings from Last 3 Encounters:  04/09/22 122/74  03/05/22 138/84  02/10/22 128/75   She has had CKD followed by nephrologist about once a year, not on Kerendia or SGLT2 drugs No  microalbuminuria  Lab Results  Component Value Date   CREATININE 1.60 (H) 04/06/2022   CREATININE 2.16 (H) 03/05/2022   CREATININE 1.37 (H) 02/10/2022    Lipid management: Treated with atorvastatin 10 mg daily prescribed by her PCP    Lab Results  Component Value Date   CHOL 157 09/07/2021   CHOL 134 06/17/2020   CHOL 124 03/24/2018   Lab Results  Component Value Date   HDL 57.30 09/07/2021   HDL 49.50 06/17/2020   HDL 41.10 03/24/2018   Lab Results  Component Value Date   LDLCALC 76 09/07/2021   LDLCALC 62 06/17/2020   LDLCALC 64 03/24/2018   Lab Results  Component Value Date   TRIG 123.0 09/07/2021   TRIG 114.0 06/17/2020   TRIG 96.0 03/24/2018   Lab Results  Component Value Date   CHOLHDL 3 09/07/2021   CHOLHDL 3 06/17/2020   CHOLHDL 3 03/24/2018   No results found for: "LDLDIRECT"     Examination:   BP 122/74 (BP Location: Left Arm, Patient Position: Sitting, Cuff Size: Normal)   Pulse 70   Ht 5' 4"$  (1.626 m)   Wt 216 lb 9.6 oz (98.2 kg)   SpO2 100%   BMI 37.18 kg/m   Body mass index is 37.18 kg/m.    ASSESSMENT/ PLAN:    Diabetes type 2 with severe obesity, hypertension and hyperlipidemia:   Current regimen: Premixed insulin once a day in the morning and Trulicity 4.5 mg weekly  See history  of present illness for detailed discussion of current diabetes management, blood sugar patterns and problems identified  A1c is 6.4  Blood glucose levels are much lower now partly because of weight loss in the last couple of months.  This is secondary to previous acute illness Even without Trulicity her blood sugars are staying fairly good and she has maintained some weight loss Difficult to assess her actual readings because of falsely low readings on her libre sensor Does not appear to need any mealtime insulin currently compared to her last visit  Recommendations for diabetes:  Start using [place] 3 She will let us know if she has unusually high or  low sugars with more complete monitoring using the [place] 3 Also may initially compare readings to fingersticks Resume Trulicity but start with only 0.75 weekly If this does not suppress her appetite will use 1.5 mg weekly, patient's husband will let us know  HYPOKALEMIA: This is likely to from overall decreased intake recently and continuing diuretics and no potassium supplements, she will reduce her Lasix to 1 tablet a day and ask PCP about potassium  Total visit time for evaluation and management plus counseling = 30 minutes  Patient Instructions  Take 46 units now and when on Trulicity reduce insulin to 40 units  Fluid pill 1x per day  Ask Dr Larose Kells re: potassium   Elayne Snare 04/09/2022, 2:48 PM

## 2022-04-09 NOTE — Patient Instructions (Addendum)
Take 46 units now and when on Trulicity reduce insulin to 40 units  Fluid pill 1x per day  Ask Dr Larose Kells re: potassium

## 2022-04-09 NOTE — Telephone Encounter (Signed)
Orders signed and faxed to [facility] at [phone]. Form sent for scanning.

## 2022-04-12 ENCOUNTER — Other Ambulatory Visit (INDEPENDENT_AMBULATORY_CARE_PROVIDER_SITE_OTHER): Payer: Medicare Other

## 2022-04-12 ENCOUNTER — Other Ambulatory Visit: Payer: Medicare Other

## 2022-04-12 DIAGNOSIS — E876 Hypokalemia: Secondary | ICD-10-CM | POA: Diagnosis not present

## 2022-04-13 DIAGNOSIS — F02C18 Dementia in other diseases classified elsewhere, severe, with other behavioral disturbance: Secondary | ICD-10-CM | POA: Diagnosis not present

## 2022-04-13 DIAGNOSIS — G301 Alzheimer's disease with late onset: Secondary | ICD-10-CM | POA: Diagnosis not present

## 2022-04-13 DIAGNOSIS — A419 Sepsis, unspecified organism: Secondary | ICD-10-CM | POA: Diagnosis not present

## 2022-04-13 DIAGNOSIS — I1310 Hypertensive heart and chronic kidney disease without heart failure, with stage 1 through stage 4 chronic kidney disease, or unspecified chronic kidney disease: Secondary | ICD-10-CM | POA: Diagnosis not present

## 2022-04-13 DIAGNOSIS — J181 Lobar pneumonia, unspecified organism: Secondary | ICD-10-CM | POA: Diagnosis not present

## 2022-04-13 DIAGNOSIS — F05 Delirium due to known physiological condition: Secondary | ICD-10-CM | POA: Diagnosis not present

## 2022-04-13 LAB — BASIC METABOLIC PANEL
BUN: 13 mg/dL (ref 6–23)
CO2: 27 mEq/L (ref 19–32)
Calcium: 9.3 mg/dL (ref 8.4–10.5)
Chloride: 102 mEq/L (ref 96–112)
Creatinine, Ser: 1.59 mg/dL — ABNORMAL HIGH (ref 0.40–1.20)
GFR: 29.75 mL/min — ABNORMAL LOW (ref >60.00)
Glucose, Bld: 200 mg/dL — ABNORMAL HIGH (ref 70–99)
Potassium: 3.9 mEq/L (ref 3.5–5.1)
Sodium: 138 mEq/L (ref 135–145)

## 2022-04-23 ENCOUNTER — Encounter: Payer: Self-pay | Admitting: Endocrinology

## 2022-04-25 ENCOUNTER — Other Ambulatory Visit: Payer: Self-pay | Admitting: Internal Medicine

## 2022-05-04 ENCOUNTER — Ambulatory Visit (INDEPENDENT_AMBULATORY_CARE_PROVIDER_SITE_OTHER): Payer: Medicare Other | Admitting: Internal Medicine

## 2022-05-04 ENCOUNTER — Encounter: Payer: Self-pay | Admitting: Internal Medicine

## 2022-05-04 VITALS — BP 132/66 | HR 99 | Temp 97.8°F | Resp 18 | Ht 64.0 in | Wt 210.2 lb

## 2022-05-04 DIAGNOSIS — F03C18 Unspecified dementia, severe, with other behavioral disturbance: Secondary | ICD-10-CM

## 2022-05-04 DIAGNOSIS — L989 Disorder of the skin and subcutaneous tissue, unspecified: Secondary | ICD-10-CM | POA: Diagnosis not present

## 2022-05-04 DIAGNOSIS — I10 Essential (primary) hypertension: Secondary | ICD-10-CM | POA: Diagnosis not present

## 2022-05-04 DIAGNOSIS — E119 Type 2 diabetes mellitus without complications: Secondary | ICD-10-CM

## 2022-05-04 MED ORDER — DULOXETINE HCL 20 MG PO CPEP: 20.0000 mg | ORAL_CAPSULE | Freq: Every day | ORAL | 0 refills | Status: DC

## 2022-05-04 NOTE — Progress Notes (Unsigned)
Subjective:    Patient ID: Angel Green, female    DOB: March 30, 1940, 82 y.o.   MRN: [id number]  DOS:  05/04/2022 Type of visit - description: f/u.  Here with her husband and her niece   No new concerns. They saw endocrinology. Still has a skin lesion on right from the nose. Ongoing clear nasal discharge.  Treatment?.  Review of Systems See above   Past Medical History:  Diagnosis Date   Allergy    Dust Mites   Ankle pain    chronic   Diabetes mellitus, type 2 ([place])    Hyperlipidemia    Hypertension    Memory loss    Osteoarthritis    RENAL INSUFFICIENCY, CHRONIC 04/16/2010   Rhinitis    allergic nos    Past Surgical History:  Procedure Laterality Date   CATARACT EXTRACTION     CHOLECYSTECTOMY     dental implants     PARTIAL HYSTERECTOMY     in her 30s    Current Outpatient Medications  Medication Instructions   amLODipine (NORVASC) 5 mg, Oral, Daily   aspirin 81 mg, Daily   atorvastatin (LIPITOR) 10 mg, Oral, Daily   azelastine (ASTELIN) 0.1 % nasal spray Each Nare, 2 times daily, Use in each nostril as directed   Blood Glucose Monitoring Suppl (FREESTYLE LITE) DEVI Use to monitor your blood sugars   Continuous Blood Gluc Sensor (FREESTYLE LIBRE 2 SENSOR) MISC 1 Device, Does not apply, Every 14 days   Cyanocobalamin (B-12 PO) 1 tablet, Oral, Daily, Takes 5000 mcg by mouth daily.   donepezil (ARICEPT) 10 mg, Oral, Daily at bedtime   Dulaglutide (TRULICITY) A999333 0000000 SOPN Inject in the abdominal skin as directed once a week   DULoxetine (CYMBALTA) 20 mg, Oral, Daily   EPINEPHrine (EPI-PEN) 0.3 mg,  Once   furosemide (LASIX) 40 mg, Oral, Daily   glucose blood (FREESTYLE LITE) test strip Use to monitor your blood sugars 4 times per day; E11.9   Insulin Lispro Prot & Lispro (HUMALOG MIX 75/25 KWIKPEN) (75-25) 100 UNIT/ML Kwikpen 50 Units, Subcutaneous, Daily with breakfast, And pen needles 1/day   Lancets (FREESTYLE) lancets Use to monitor blood sugars 4 times  per day, once before each meal and once at bedtime   loratadine (CLARITIN) 10 MG tablet TAKE 1 TABLET DAILY   memantine (NAMENDA) 10 mg, Oral, 2 times daily   montelukast (SINGULAIR) 10 mg, Oral, Daily at bedtime   Multiple Vitamins-Minerals (CENTRUM SILVER PO) 1 tablet, Oral, Daily,     Omega-3 Fatty Acids (FISH OIL PO) 1 capsule, Oral, Daily   potassium chloride (KLOR-CON M) 10 MEQ tablet 20 mEq, Oral, Daily   QUEtiapine (SEROQUEL) 50 mg, Oral, Daily at bedtime   SURE COMFORT PEN NEEDLES 31G X 5 MM MISC Use to inject insulin 1 time per day.       Objective:   Physical Exam HENT:     Head:     BP 132/66   Pulse 99   Temp 97.8 F (36.6 C) (Oral)   Resp 18   Ht '5\' 4"'$  (1.626 m)   Wt 210 lb 4 oz (95.4 kg)   SpO2 99%   BMI 36.09 kg/m  General:   Well developed, NAD, BMI noted. HEENT:  Normocephalic . Face symmetric, atraumatic Nose hyperpigmented.  See graphic Lungs:  Poor inspiratory effort.  Clear Normal respiratory effort, no intercostal retractions, no accessory muscle use. Heart: RRR,  no murmur.  Lower extremities: no pretibial edema bilaterally.  Good pedal pulses, well-perfused toes Skin: Not pale. Not jaundice Neurologic:  alert & pleasantly demented Speech normal, gait not tested  psych--  Behavior appropriate. No anxious or depressed appearing.      Assessment      Assessment   DM  Dr Loanne Drilling-- [name] + Neuropathy: Per foot exam 12-2014 HTN ---change ACE to ARB is 03/2014 Hyperlipidemia CRI  dx 2012  Sees Dr Arty Baumgartner  Morbid obesity DJD, had  chronic ankle pain Allergies -- dust mites , occ uses a inhaler , sees allergist, has shots q weeks started ~ 08-2014 Dementia:  MMSE 7/218: 24, rx Aricept.  B12, RPR, sed rate and TSH normal MMSE 07/2017 :13 worse MRI brain 6/19:  prominent right temporal lobe volume loss.  PLAN HTN: BP looks very good, continue amlodipine, Lasix, potassium, last BMP satisfactory. DM: Saw Endo 04/09/2022 Neuropathy:  Continue complaining of "cold feet", good pedal pulses, toes well-perfused. Dementia: Stable, on Aricept and Namenda. Rhinitis: Chronic rhinitis, persistent clear discharge, trial with Astepro. Skin lesion: R from the nose, observation. RTC 4 months 1-12 Hospital follow-up, TCM 14 Medication reconciliation: Not taking losartan as recommended by hospital team Not taking Humalog due to low blood sugar Humalog 75/25: Taking only 50 units daily Family holding aspirin (reason?). OK to go back Sepsis due to pneumonia: Admitted to the hospital with mental status changes and chest pain, workup show pneumonia on CT, not on x-ray Treated with IV antibiotics, she finished follow-up oral antibiotics already and clinically seems to be better with no fever, cough or SOB Plan: BMP and CBC. Acute on chronic CKD: At the hospital, creatinine was mildly increased from baseline, upon discharge it was 1.3.  Rechecking today. HTN: Currently on amlodipine, Lasix, potassium.  Checking labs, losartan stopped at the hospital due to increased creatinine.  Consider restart if appropriate. DM: The patient has freestyle CGM, getting readings in the 60s, whenever the check fingerstick is typically 40 or 50 points higher.  Recommend: Replace CGM, continue Humalog 75/25 50 units daily, continue holding Humalog for now, call with CBGs in 1 week.  Follow-up with Endo in few weeks as planned. Poor appetite: Related to dulaglutide? Pt's niece e wonders if that should be stopped.  The husband is in favor to continue.  Recommend to discuss with Endo.   Incidental thyroid mass:  finding discussed, could be something malignant, this was discussed with husband  and he is very clear that he does not like to proceed with further evaluation, he will not allow the biopsy or surgery.  I agree and understand. RTC 2 months

## 2022-05-04 NOTE — Patient Instructions (Addendum)
For runny nose: Start Astepro nasal spray, over-the-counter, 2 sprays on each side of the nose twice daily  We sent a prescription to your local pharmacy for Cymbalta   Check the  blood pressure regularly BP GOAL is between 110/65 and  135/85. If it is consistently higher or lower, let me know      [place], [place] back for checkup in 4 months    Per our records you are due for your diabetic eye exam. Please contact your eye doctor to schedule an appointment. Please have them send copies of your office visit notes to [place]. Our fax number is [phone]. If you need a referral to an eye doctor please let us know.

## 2022-05-05 ENCOUNTER — Ambulatory Visit
Admission: RE | Admit: 2022-05-05 | Discharge: 2022-05-05 | Disposition: A | Discharge status: Home / Self Care | Payer: Medicare Other | Source: Ambulatory Visit | Attending: Endocrinology | Admitting: Endocrinology

## 2022-05-05 DIAGNOSIS — E042 Nontoxic multinodular goiter: Secondary | ICD-10-CM | POA: Diagnosis not present

## 2022-05-05 DIAGNOSIS — E041 Nontoxic single thyroid nodule: Secondary | ICD-10-CM

## 2022-05-06 NOTE — Assessment & Plan Note (Signed)
HTN: BP looks very good, continue amlodipine, Lasix, potassium, last BMP satisfactory. DM: Saw Endo 04/09/2022 Neuropathy: Continue complaining of "cold feet", good pedal pulses, toes well-perfused. Dementia: Stable, on Aricept and Namenda. Rhinitis: Chronic rhinitis, persistent clear discharge, trial with Astepro. Skin lesion: R from the nose, observation. RTC 4 months

## 2022-05-07 ENCOUNTER — Other Ambulatory Visit: Payer: Self-pay

## 2022-05-07 DIAGNOSIS — E119 Type 2 diabetes mellitus without complications: Secondary | ICD-10-CM

## 2022-05-07 MED ORDER — FREESTYLE LITE TEST VI STRP: ORAL_STRIP | 12 refills | Status: AC

## 2022-05-10 ENCOUNTER — Telehealth: Payer: Self-pay | Admitting: Internal Medicine

## 2022-05-10 ENCOUNTER — Telehealth: Payer: Self-pay | Admitting: Endocrinology

## 2022-05-10 DIAGNOSIS — E1165 Type 2 diabetes mellitus with hyperglycemia: Secondary | ICD-10-CM

## 2022-05-10 MED ORDER — TRULICITY 0.75 MG/0.5ML ~~LOC~~ SOAJ: SUBCUTANEOUS | 1 refills | Status: DC

## 2022-05-10 NOTE — Telephone Encounter (Signed)
MEDICATION:  Trulicity Dulaglutide (TRULICITY) A999333 0000000 SOPN  PHARMACY:  [facility] [place] [place] (Ph: [phone])   HAS THE PATIENT CONTACTED THEIR PHARMACY?  Yes  IS THIS A 90 DAY SUPPLY : Yes  IS PATIENT OUT OF MEDICATION: Yes  IF NOT; HOW MUCH IS LEFT:   LAST APPOINTMENT DATE: @2 /16/2024  NEXT APPOINTMENT DATE:@5 /09/2022  DO WE HAVE YOUR PERMISSION TO LEAVE A DETAILED MESSAGE?: Yes  OTHER COMMENTS:    **Let patient know to contact pharmacy at the end of the day to make sure medication is ready. **  ** Please notify patient to allow 48-72 hours to process**  **Encourage patient to contact the pharmacy for refills or they can request refills through [facility]**

## 2022-05-10 NOTE — Telephone Encounter (Signed)
Rx sent 

## 2022-05-10 NOTE — Telephone Encounter (Signed)
Contacted Angel Green to schedule their annual wellness visit. Appointment made for 05/18/2022.  Sherol Dade; Care Guide Ambulatory [facility] Direct Dial: [phone]

## 2022-05-11 DIAGNOSIS — E119 Type 2 diabetes mellitus without complications: Secondary | ICD-10-CM | POA: Diagnosis not present

## 2022-05-11 DIAGNOSIS — H40013 Open angle with borderline findings, low risk, bilateral: Secondary | ICD-10-CM | POA: Diagnosis not present

## 2022-05-11 DIAGNOSIS — Z961 Presence of intraocular lens: Secondary | ICD-10-CM | POA: Diagnosis not present

## 2022-05-11 LAB — HM DIABETES EYE EXAM

## 2022-05-11 NOTE — Progress Notes (Signed)
Please call to let patient and husband know that the thyroid ultrasound results show no significant abnormality

## 2022-05-13 ENCOUNTER — Other Ambulatory Visit: Payer: Self-pay

## 2022-05-13 ENCOUNTER — Other Ambulatory Visit (HOSPITAL_COMMUNITY): Payer: Self-pay

## 2022-05-13 DIAGNOSIS — E1165 Type 2 diabetes mellitus with hyperglycemia: Secondary | ICD-10-CM

## 2022-05-13 MED ORDER — TRULICITY 0.75 MG/0.5ML ~~LOC~~ SOAJ: SUBCUTANEOUS | 1 refills | Status: DC

## 2022-05-13 NOTE — Telephone Encounter (Signed)
Pts spouse requested to have Trulicity [id number] sent over to [facility] since they were having issues with [facility].

## 2022-05-18 ENCOUNTER — Ambulatory Visit (INDEPENDENT_AMBULATORY_CARE_PROVIDER_SITE_OTHER): Payer: Medicare Other | Admitting: *Deleted

## 2022-05-18 VITALS — BP 119/68 | HR 82

## 2022-05-18 DIAGNOSIS — Z0000 Encounter for general adult medical examination without abnormal findings: Secondary | ICD-10-CM

## 2022-05-18 NOTE — Progress Notes (Signed)
Subjective:   Angel Green is a 82 y.o. female who presents for Medicare Annual (Subsequent) preventive examination.  Review of Systems     Cardiac Risk Factors include: advanced age (>7men, >66 women);dyslipidemia;diabetes mellitus;hypertension;sedentary lifestyle;obesity (BMI >30kg/m2)     Objective:    Today's Vitals   05/18/22 1458  BP: 119/68  Pulse: 82   There is no height or weight on file to calculate BMI.     05/18/2022    2:58 PM 02/08/2022   11:00 PM 01/29/2022    6:12 PM 08/11/2021    2:10 PM 11/14/2018    9:18 AM 09/22/2018   10:54 AM 11/11/2017   10:31 AM  Advanced Directives  Does Patient Have a Medical Advance Directive? No No No No No No Yes  Type of Transport planner;Living will  Copy of [place] in Chart?       No - copy requested  Would patient like information on creating a medical advance directive? No - Patient declined No - Guardian declined  No - Patient declined No - Patient declined No - Patient declined     Current Medications (verified) Outpatient Encounter Medications as of 05/18/2022  Medication Sig   amLODipine (NORVASC) 5 MG tablet Take 1 tablet (5 mg total) by mouth daily.   aspirin 81 MG chewable tablet Chew 81 mg by mouth daily.   atorvastatin (LIPITOR) 10 MG tablet Take 1 tablet (10 mg total) by mouth daily.   azelastine (ASTELIN) 0.1 % nasal spray Place into both nostrils 2 (two) times daily. Use in each nostril as directed   Blood Glucose Monitoring Suppl (FREESTYLE LITE) DEVI Use to monitor your blood sugars   Continuous Blood Gluc Sensor (FREESTYLE LIBRE 2 SENSOR) MISC 1 Device by Does not apply route every 14 (fourteen) days.   Cyanocobalamin (B-12 PO) Take 1 tablet by mouth daily. Takes 5000 mcg by mouth daily.   donepezil (ARICEPT) 10 MG tablet Take 1 tablet (10 mg total) by mouth at bedtime.   Dulaglutide (TRULICITY) A999333 0000000 SOPN Inject in the abdominal skin as  directed once a week   DULoxetine (CYMBALTA) 20 MG capsule Take 1 capsule (20 mg total) by mouth daily.   EPINEPHrine 0.3 mg/0.3 mL IJ SOAJ injection Inject 0.3 mg into the muscle once. Reported on 06/30/2015 (Patient not taking: Reported on 05/04/2022)   furosemide (LASIX) 20 MG tablet Take 2 tablets (40 mg total) by mouth daily.   glucose blood (FREESTYLE LITE) test strip Use to monitor your blood sugars 4 times per day; E11.9   Insulin Lispro Prot & Lispro (HUMALOG MIX 75/25 KWIKPEN) (75-25) 100 UNIT/ML Kwikpen Inject 50 Units into the skin daily with breakfast. And pen needles 1/day (Patient taking differently: Inject 40 Units into the skin daily with breakfast. And pen needles 1/day)   Lancets (FREESTYLE) lancets Use to monitor blood sugars 4 times per day, once before each meal and once at bedtime   loratadine (CLARITIN) 10 MG tablet TAKE 1 TABLET DAILY   memantine (NAMENDA) 10 MG tablet Take 1 tablet (10 mg total) by mouth 2 (two) times daily.   montelukast (SINGULAIR) 10 MG tablet Take 10 mg by mouth at bedtime.   Multiple Vitamins-Minerals (CENTRUM SILVER PO) Take 1 tablet by mouth daily.   Omega-3 Fatty Acids (FISH OIL PO) Take 1 capsule by mouth daily in the afternoon.   potassium chloride (KLOR-CON M) 10 MEQ tablet Take 2  tablets (20 mEq total) by mouth daily.   QUEtiapine (SEROQUEL) 50 MG tablet Take 1 tablet (50 mg total) by mouth at bedtime.   SURE COMFORT PEN NEEDLES 31G X 5 MM MISC Use to inject insulin 1 time per day.   No facility-administered encounter medications on file as of 05/18/2022.    Allergies (verified) Dust mite mixed allergen ext [mite (d. farinae)]   History: Past Medical History:  Diagnosis Date   Allergy    Dust Mites   Ankle pain    chronic   Diabetes mellitus, type 2 ([place])    Hyperlipidemia    Hypertension    Memory loss    Osteoarthritis    RENAL INSUFFICIENCY, CHRONIC 04/16/2010   Rhinitis    allergic nos   Past Surgical History:  Procedure  Laterality Date   CATARACT EXTRACTION     CHOLECYSTECTOMY     dental implants     PARTIAL HYSTERECTOMY     in her 21s   Family History  Problem Relation Age of Onset   Healthy Brother    Healthy Son    Healthy Son    Other Mother        unsure of history - "old age"   Other Father        unsure of history - "old age"   Heart attack Neg Hx    Colon cancer Neg Hx    Breast cancer Neg Hx    Stroke Neg Hx    Social History   Socioeconomic History   Marital status: Married    Spouse name: Not on file   Number of children: 2   Years of education: some college   Highest education level: Not on file  Occupational History   Occupation:  retired  Tobacco Use   Smoking status: Never   Smokeless tobacco: Never  Vaping Use   Vaping Use: Never used  Substance and Sexual Activity   Alcohol use: No    Alcohol/week: 0.0 standard drinks of alcohol   Drug use: No   Sexual activity: Yes    Partners: Male  Other Topics Concern   Not on file  Social History Narrative   Husband is a Company secretary.   Right-handed.   1 cup caffeine daily.   Lives at home with husband.          Social Determinants of Health   Financial Resource Strain: Low Risk  (08/17/2021)   Overall Financial Resource Strain (CARDIA)    Difficulty of Paying Living Expenses: Not very hard  Food Insecurity: No Food Insecurity (02/11/2022)   Hunger Vital Sign    Worried About Running Out of Food in the Last Year: Never true    Ran Out of Food in the Last Year: Never true  Transportation Needs: No Transportation Needs (02/11/2022)   PRAPARE - Hydrologist (Medical): No    Lack of Transportation (Non-Medical): No  Physical Activity: Inactive (05/18/2022)   Exercise Vital Sign    Days of Exercise per Week: 0 days    Minutes of Exercise per Session: 0 min  Stress: Not on file  Social Connections: Not on file    Tobacco Counseling Counseling given: Not Answered   Clinical  Intake:  Pre-visit preparation completed: Yes  Pain : No/denies pain     Nutritional Risks: None Diabetes: Yes CBG done?: No Did pt. bring in CBG monitor from home?: No  How often do you need to have someone help  you when you read instructions, pamphlets, or other written materials from your doctor or pharmacy?: 5 - Always  Activities of Daily Living    05/18/2022    3:02 PM 02/08/2022   11:00 PM  In your present state of health, do you have any difficulty performing the following activities:  Hearing? 0 0  Vision? 0 0  Difficulty concentrating or making decisions? 1 1  Walking or climbing stairs? 1 1  Dressing or bathing? 1 1  Doing errands, shopping? 1 0  Preparing Food and eating ? Y   Using the Toilet? Y   In the past six months, have you accidently leaked urine? Y   Do you have problems with loss of bowel control? Y   Managing your Medications? Y   Managing your Finances? Y   Housekeeping or managing your Housekeeping? Y     Patient Care Team: Colon Branch, MD as PCP - General (Internal Medicine) Renato Shin, MD (Inactive) as Consulting Physician (Endocrinology) Donato Heinz, MD as Consulting Physician (Nephrology) Tiajuana Amass, MD as Referring Physician (Allergy and Immunology) Juanita Craver, MD as Consulting Physician (Gastroenterology) Renette Butters, MD as Attending Physician (Orthopedic Surgery) Sydnee Levans, MD as Referring Physician (Dermatology) Clent Jacks, MD as Consulting Physician (Ophthalmology) Cherre Robins, RPH-CPP (Pharmacist) Elayne Snare, MD as Consulting Physician (Endocrinology)  Indicate any recent Medical Services you may have received from other than [facility] providers in the past year (date may be approximate).     Assessment:   This is a routine wellness examination for Angel Green.  Hearing/Vision screen No results found.  Dietary issues and exercise activities discussed: Current Exercise Habits: The patient does not  participate in regular exercise at present, Exercise limited by: None identified   Goals Addressed   None    Depression Screen    05/18/2022    3:22 PM 05/04/2022    2:49 PM 01/12/2022    1:54 PM 12/18/2020    9:55 AM 09/08/2020   10:49 AM 12/18/2019    1:03 PM 11/14/2018    9:18 AM  PHQ 2/9 Scores  PHQ - 2 Score  0 0 0 0 0 0  Exception Documentation Other- indicate reason in comment box        Not completed pt unable to answer questions due to severe late onset Alzheimer's dementia          Fall Risk    05/18/2022    3:01 PM 05/04/2022    2:49 PM 01/12/2022    1:54 PM 07/16/2021   10:23 AM 12/18/2020    9:55 AM  [place] in the past year? 0 0 0 0 0  Number falls in past yr: 0 0 0  0  Injury with Fall? 0 0 0  0  Risk for fall due to : Impaired balance/gait      Follow up Falls evaluation completed Falls evaluation completed;Education provided Falls evaluation completed  Falls evaluation completed    FALL RISK PREVENTION PERTAINING TO THE HOME:  Any stairs in or around the home? Yes  If so, are there any without handrails? No  Home free of loose throw rugs in walkways, pet beds, electrical cords, etc? Yes  Adequate lighting in your home to reduce risk of falls? Yes   ASSISTIVE DEVICES UTILIZED TO PREVENT FALLS:  Life alert? No  Use of a cane, walker or w/c? Yes  Grab bars in the bathroom? Yes  Shower chair or bench in shower? Yes  Elevated toilet seat or a handicapped toilet? Yes   TIMED UP AND GO:  Was the test performed?  No, pt in wheelchair .    Cognitive Function:    05/18/2022    3:16 PM 06/17/2020   10:45 AM 01/31/2020    9:51 AM 08/09/2019    2:53 PM 11/14/2018    9:31 AM  MMSE - Mini Mental State Exam  Not completed: Unable to complete    Unable to complete  Orientation to time  0 0 1   Orientation to Place  0 0 3   Registration  3 3 3    Attention/ Calculation  0 0 0   Recall  0 0 0   Language- name 2 objects  2 2 2    Language- repeat  1 1  1    Language- follow 3 step command  3 0 3   Language- read & follow direction  1 0 1   Write a sentence  0 0 1   Copy design  0 0 0   Copy design-comments    4 animals   Total score  10 6 15        10/21/2021   10:00 AM  Montreal Cognitive Assessment   Visuospatial/ Executive (0/5) 1  Naming (0/3) 0  Attention: Read list of digits (0/2) 0  Attention: Read list of letters (0/1) 0  Attention: Serial 7 subtraction starting at 100 (0/3) 0  Language: Repeat phrase (0/2) 0  Language : Fluency (0/1) 0  Abstraction (0/2) 0  Delayed Recall (0/5) 0  Orientation (0/6) 0  Total 1      Immunizations Immunization History  Administered Date(s) Administered   Fluad Quad(high Dose 65+) 10/12/2018, 12/18/2019   Influenza Split 12/02/2011, 12/21/2013, 10/24/2020   Influenza Whole 12/21/2006, 11/28/2007, 11/22/2008, 11/23/2010   Influenza, High Dose Seasonal PF 11/29/2012, 12/02/2015, 12/06/2016, 11/11/2017   Influenza-Unspecified 12/04/2010, 12/18/2013, 12/05/2014   Moderna Covid-19 Vaccine Bivalent Booster 24yrs & up 11/28/2021   Moderna Sars-Covid-2 Vaccination 03/20/2019, 04/17/2019, 12/24/2019, 05/23/2020   Pneumococcal Conjugate-13 12/18/2013   Pneumococcal Polysaccharide-23 11/14/2007, 03/24/2018   Td 11/14/2007   Tdap 04/11/2018   Zoster Recombinat (Shingrix) 03/28/2018, 09/11/2018    TDAP status: Up to date  Flu Vaccine status: Declined, Education has been provided regarding the importance of this vaccine but patient still declined. Advised may receive this vaccine at local pharmacy or Health Dept. Aware to provide a copy of the vaccination record if obtained from local pharmacy or Health Dept. Verbalized acceptance and understanding.  Pneumococcal vaccine status: Up to date  Covid-19 vaccine status: Information provided on how to obtain vaccines.   Qualifies for Shingles Vaccine? Yes   Zostavax completed No   Shingrix Completed?: Yes  Screening Tests Health Maintenance   Topic Date Due   Medicare Annual Wellness (AWV)  11/14/2019   FOOT EXAM  10/29/2021   INFLUENZA VACCINE  05/23/2022 (Originally 09/22/2021)   COVID-19 Vaccine (6 - 2023-24 season) 11/03/2022 (Originally 01/23/2022)   HEMOGLOBIN A1C  10/05/2022   Diabetic kidney evaluation - Urine ACR  04/10/2023   Diabetic kidney evaluation - eGFR measurement  04/13/2023   OPHTHALMOLOGY EXAM  05/11/2023   DTaP/Tdap/Td (3 - Td or Tdap) 04/11/2028   Pneumonia Vaccine 45+ Years old  Completed   DEXA SCAN  Completed   Zoster Vaccines- Shingrix  Completed   HPV VACCINES  Aged Out    Health Maintenance  Health Maintenance Due  Topic Date Due   Medicare Annual Wellness (AWV)  11/14/2019  FOOT EXAM  10/29/2021    Colorectal cancer screening: No longer required.   Mammogram status: Completed 03/12/22. Repeat every year  Bone Density status: pt declined.  Lung Cancer Screening: (Low Dose CT Chest recommended if Age 52-80 years, 30 pack-year currently smoking OR have quit w/in 15years.) does not qualify.    Additional Screening:  Hepatitis C Screening: does not qualify  Vision Screening: Recommended annual ophthalmology exams for early detection of glaucoma and other disorders of the eye. Is the patient up to date with their annual eye exam?  Yes  Who is the provider or what is the name of the office in which the patient attends annual eye exams? Dr. Lanetta Inch If pt is not established with a provider, would they like to be referred to a provider to establish care? No .   Dental Screening: Recommended annual dental exams for proper oral hygiene  Community Resource Referral / Chronic Care Management: CRR required this visit?  No   CCM required this visit?  No      Plan:     I have personally reviewed and noted the following in the patient's chart:   Medical and social history Use of alcohol, tobacco or illicit drugs  Current medications and supplements including opioid prescriptions.  Patient is not currently taking opioid prescriptions. Functional ability and status Nutritional status Physical activity Advanced directives List of other physicians Hospitalizations, surgeries, and ER visits in previous 12 months Vitals Screenings to include cognitive, depression, and falls Referrals and appointments  In addition, I have reviewed and discussed with patient certain preventive protocols, quality metrics, and best practice recommendations. A written personalized care plan for preventive services as well as general preventive health recommendations were provided to patient.     Beatris Ship, [place]   05/18/2022   Nurse Notes: None

## 2022-05-18 NOTE — Patient Instructions (Signed)
Angel Green , Thank you for taking time to come for your Medicare Wellness Visit. I appreciate your ongoing commitment to your health goals. Please review the following plan we discussed and let me know if I can assist you in the future.     This is a list of the screening recommended for you and due dates:  Health Maintenance  Topic Date Due   Complete foot exam   10/29/2021   Flu Shot  05/23/2022*   COVID-19 Vaccine (6 - 2023-24 season) 11/03/2022*   Hemoglobin A1C  10/05/2022   Yearly kidney health urinalysis for diabetes  04/10/2023   Yearly kidney function blood test for diabetes  04/13/2023   Eye exam for diabetics  05/11/2023   Medicare Annual Wellness Visit  05/18/2023   DTaP/Tdap/Td vaccine (3 - Td or Tdap) 04/11/2028   Pneumonia Vaccine  Completed   DEXA scan (bone density measurement)  Completed   Zoster (Shingles) Vaccine  Completed   HPV Vaccine  Aged Out  *Topic was postponed. The date shown is not the original due date.    Next appointment: Follow up in one year for your annual wellness visit.   Preventive Care 33 Years and Older, Female Preventive care refers to lifestyle choices and visits with your health care provider that can promote health and wellness. What does preventive care include? A yearly physical exam. This is also called an annual well check. Dental exams once or twice a year. Routine eye exams. Ask your health care provider how often you should have your eyes checked. Personal lifestyle choices, including: Daily care of your teeth and gums. Regular physical activity. Eating a healthy diet. Avoiding tobacco and drug use. Limiting alcohol use. Practicing safe sex. Taking low-dose aspirin every day. Taking vitamin and mineral supplements as recommended by your health care provider. What happens during an annual well check? The services and screenings done by your health care provider during your annual well check will depend on your age,  overall health, lifestyle risk factors, and family history of disease. Counseling  Your health care provider may ask you questions about your: Alcohol use. Tobacco use. Drug use. Emotional well-being. Home and relationship well-being. Sexual activity. Eating habits. History of falls. Memory and ability to understand (cognition). Work and work Statistician. Reproductive health. Screening  You may have the following tests or measurements: Height, weight, and BMI. Blood pressure. Lipid and cholesterol levels. These may be checked every 5 years, or more frequently if you are over 36 years old. Skin check. Lung cancer screening. You may have this screening every year starting at age 43 if you have a 30-pack-year history of smoking and currently smoke or have quit within the past 15 years. Fecal occult blood test (FOBT) of the stool. You may have this test every year starting at age 82. Flexible sigmoidoscopy or colonoscopy. You may have a sigmoidoscopy every 5 years or a colonoscopy every 10 years starting at age 85. Hepatitis C blood test. Hepatitis B blood test. Sexually transmitted disease (STD) testing. Diabetes screening. This is done by checking your blood sugar (glucose) after you have not eaten for a while (fasting). You may have this done every 1-3 years. Bone density scan. This is done to screen for osteoporosis. You may have this done starting at age 52. Mammogram. This may be done every 1-2 years. Talk to your health care provider about how often you should have regular mammograms. Talk with your health care provider about your test results,  treatment options, and if necessary, the need for more tests. Vaccines  Your health care provider may recommend certain vaccines, such as: Influenza vaccine. This is recommended every year. Tetanus, diphtheria, and acellular pertussis (Tdap, Td) vaccine. You may need a Td booster every 10 years. Zoster vaccine. You may need this after age  67. Pneumococcal 13-valent conjugate (PCV13) vaccine. One dose is recommended after age 24. Pneumococcal polysaccharide (PPSV23) vaccine. One dose is recommended after age 79. Talk to your health care provider about which screenings and vaccines you need and how often you need them. This information is not intended to replace advice given to you by your health care provider. Make sure you discuss any questions you have with your health care provider. Document Released: 03/07/2015 Document Revised: 10/29/2015 Document Reviewed: 12/10/2014 Elsevier Interactive Patient Education  2017 [facility] Prevention in the Home Falls can cause injuries. They can happen to people of all ages. There are many things you can do to make your home safe and to help prevent falls. What can I do on the outside of my home? Regularly fix the edges of walkways and driveways and fix any cracks. Remove anything that might make you trip as you walk through a door, such as a raised step or threshold. Trim any bushes or trees on the path to your home. Use bright outdoor lighting. Clear any walking paths of anything that might make someone trip, such as rocks or tools. Regularly check to see if handrails are loose or broken. Make sure that both sides of any steps have handrails. Any raised decks and porches should have guardrails on the edges. Have any leaves, snow, or ice cleared regularly. Use sand or salt on walking paths during winter. Clean up any spills in your garage right away. This includes oil or grease spills. What can I do in the bathroom? Use night lights. Install grab bars by the toilet and in the tub and shower. Do not use towel bars as grab bars. Use non-skid mats or decals in the tub or shower. If you need to sit down in the shower, use a plastic, non-slip stool. Keep the floor dry. Clean up any water that spills on the floor as soon as it happens. Remove soap buildup in the tub or shower  regularly. Attach bath mats securely with double-sided non-slip rug tape. Do not have throw rugs and other things on the floor that can make you trip. What can I do in the bedroom? Use night lights. Make sure that you have a light by your bed that is easy to reach. Do not use any sheets or blankets that are too big for your bed. They should not hang down onto the floor. Have a firm chair that has side arms. You can use this for support while you get dressed. Do not have throw rugs and other things on the floor that can make you trip. What can I do in the kitchen? Clean up any spills right away. Avoid walking on wet floors. Keep items that you use a lot in easy-to-reach places. If you need to reach something above you, use a strong step stool that has a grab bar. Keep electrical cords out of the way. Do not use floor polish or wax that makes floors slippery. If you must use wax, use non-skid floor wax. Do not have throw rugs and other things on the floor that can make you trip. What can I do with my stairs? Do  not leave any items on the stairs. Make sure that there are handrails on both sides of the stairs and use them. Fix handrails that are broken or loose. Make sure that handrails are as long as the stairways. Check any carpeting to make sure that it is firmly attached to the stairs. Fix any carpet that is loose or worn. Avoid having throw rugs at the top or bottom of the stairs. If you do have throw rugs, attach them to the floor with carpet tape. Make sure that you have a light switch at the top of the stairs and the bottom of the stairs. If you do not have them, ask someone to add them for you. What else can I do to help prevent falls? Wear shoes that: Do not have high heels. Have rubber bottoms. Are comfortable and fit you well. Are closed at the toe. Do not wear sandals. If you use a stepladder: Make sure that it is fully opened. Do not climb a closed stepladder. Make sure that  both sides of the stepladder are locked into place. Ask someone to hold it for you, if possible. Clearly mark and make sure that you can see: Any grab bars or handrails. First and last steps. Where the edge of each step is. Use tools that help you move around (mobility aids) if they are needed. These include: Canes. Walkers. Scooters. Crutches. Turn on the lights when you go into a dark area. Replace any light bulbs as soon as they burn out. Set up your furniture so you have a clear path. Avoid moving your furniture around. If any of your floors are uneven, fix them. If there are any pets around you, be aware of where they are. Review your medicines with your doctor. Some medicines can make you feel dizzy. This can increase your chance of falling. Ask your doctor what other things that you can do to help prevent falls. This information is not intended to replace advice given to you by your health care provider. Make sure you discuss any questions you have with your health care provider. Document Released: 12/05/2008 Document Revised: 07/17/2015 Document Reviewed: 03/15/2014 Elsevier Interactive Patient Education  2017 [employer].

## 2022-05-24 ENCOUNTER — Ambulatory Visit (INDEPENDENT_AMBULATORY_CARE_PROVIDER_SITE_OTHER): Payer: Medicare Other | Admitting: Internal Medicine

## 2022-05-24 ENCOUNTER — Encounter: Payer: Self-pay | Admitting: Internal Medicine

## 2022-05-24 VITALS — BP 132/80 | HR 83 | Temp 98.8°F | Resp 18 | Ht 64.0 in

## 2022-05-24 DIAGNOSIS — L989 Disorder of the skin and subcutaneous tissue, unspecified: Secondary | ICD-10-CM

## 2022-05-24 DIAGNOSIS — R399 Unspecified symptoms and signs involving the genitourinary system: Secondary | ICD-10-CM

## 2022-05-24 NOTE — Patient Instructions (Signed)
Please provide a urine sample before you leave.  If she develops fever, chills, severe symptoms let us know.

## 2022-05-24 NOTE — Progress Notes (Signed)
Subjective:    Patient ID: Angel Green, female    DOB: 05/01/1940, 82 y.o.   MRN: [id number]  DOS:  05/24/2022 Type of visit - description: Here with her husband and niece.  The patient's husband  has is concerned because Angel Green  continuously "pick at "  a skin lesion right from the nose. Also for the last 4 weeks her chronic nocturia is worse, going to the bathroom to urinate more often.  No fever or chills No behavioral issues. No weight loss. Drinking plenty of fluids The patient herself denies any dysuria and no lower abdominal pain.  Review of Systems See above   Past Medical History:  Diagnosis Date   Allergy    Dust Mites   Ankle pain    chronic   Diabetes mellitus, type 2    Hyperlipidemia    Hypertension    Memory loss    Osteoarthritis    RENAL INSUFFICIENCY, CHRONIC 04/16/2010   Rhinitis    allergic nos    Past Surgical History:  Procedure Laterality Date   CATARACT EXTRACTION     CHOLECYSTECTOMY     dental implants     PARTIAL HYSTERECTOMY     in her 30s    Current Outpatient Medications  Medication Instructions   amLODipine (NORVASC) 5 mg, Oral, Daily   aspirin 81 mg, Oral, Daily   atorvastatin (LIPITOR) 10 mg, Oral, Daily   azelastine (ASTELIN) 0.1 % nasal spray Each Nare, 2 times daily, Use in each nostril as directed   Blood Glucose Monitoring Suppl (FREESTYLE LITE) DEVI Use to monitor your blood sugars   Continuous Blood Gluc Sensor (FREESTYLE LIBRE 2 SENSOR) MISC 1 Device, Does not apply, Every 14 days   Cyanocobalamin (B-12 PO) 1 tablet, Oral, Daily, Takes 5000 mcg by mouth daily.   donepezil (ARICEPT) 10 mg, Oral, Daily at bedtime   Dulaglutide (TRULICITY) A999333 [id number] SOPN Inject in the abdominal skin as directed once a week   DULoxetine (CYMBALTA) 20 mg, Oral, Daily   EPINEPHrine (EPI-PEN) 0.3 mg,  Once   furosemide (LASIX) 40 mg, Oral, Daily   glucose blood (FREESTYLE LITE) test strip Use to monitor your blood sugars 4 times per  day; E11.9   Insulin Lispro Prot & Lispro (HUMALOG MIX 75/25 KWIKPEN) (75-25) 100 UNIT/ML Kwikpen 50 Units, Subcutaneous, Daily with breakfast, And pen needles 1/day   Lancets (FREESTYLE) lancets Use to monitor blood sugars 4 times per day, once before each meal and once at bedtime   loratadine (CLARITIN) 10 MG tablet TAKE 1 TABLET DAILY   memantine (NAMENDA) 10 mg, Oral, 2 times daily   montelukast (SINGULAIR) 10 mg, Oral, Daily at bedtime   Multiple Vitamins-Minerals (CENTRUM SILVER PO) 1 tablet, Oral, Daily,     Omega-3 Fatty Acids (FISH OIL PO) 1 capsule, Oral, Daily   potassium chloride (KLOR-CON M) 10 MEQ tablet 20 mEq, Oral, Daily   QUEtiapine (SEROQUEL) 50 mg, Oral, Daily at bedtime   SURE COMFORT PEN NEEDLES 31G X 5 MM MISC Use to inject insulin 1 time per day.       Objective:   Physical Exam BP 132/80   Pulse 83   Temp 98.8 F (37.1 C) (Oral)   Resp 18   Ht 5\' 4"  (1.626 m)   SpO2 98%   BMI 36.09 kg/m  General:   Well developed, NAD, BMI noted.  HEENT:  Normocephalic . Face symmetric, atraumatic Abdomen:  Not distended, soft, non-tender. No rebound or rigidity.  Skin: See picture, gnosis is slightly hyperpigmented but soft and nontender. Has a skin lesion right from the nose, no ulcers, no bleeding. Neurologic:  alert & pleasantly demented Speech normal, gait not Psych--   Behavior appropriate. No anxious or depressed appearing.     Assessment      Assessment   DM  Dr Loanne Drilling-- [name] + Neuropathy: Per foot exam 12-2014 HTN ---change ACE to ARB is 03/2014 Hyperlipidemia CRI  dx 2012  Sees Dr Arty Baumgartner  Morbid obesity DJD, had  chronic ankle pain Allergies -- dust mites , occ uses a inhaler , sees allergist, has shots q weeks started ~ 08-2014 Dementia:  MMSE 7/218: 24, rx Aricept.  B12, RPR, sed rate and TSH normal MMSE 07/2017 :13 worse MRI brain 6/19:  prominent right temporal lobe volume loss.  PLAN LUTS: Increased nocturia without fever chills or  mental status changes.  Check UA urine culture.  Antibiotics if indicated. Skin lesion: Patient constantly picking at skin lesion, it is right from the nose, no ulcers, denies bleeding.    Advised pt's husband we could send her  for further evaluation, although I do not think she has a malignancy but that can only be ruled out with a biopsy.  For now we agreed on observation.  Chart review: Saw plastic surgery 07/31/2021, he biopsied both lesion of the nose and the chin.  Pathology: 1. Skin , right nose BENIGN EPIDERMAL HYPERPLASIA WITH HYPERKERATOSIS, LATERAL AND DEEP MARGINS INVOLVED 2. Skin , left chin PRURIGO NODULARIS OVERLYING EPIDERMOID CYST, SCARRED, DEEP MARGIN INVOLVED

## 2022-05-24 NOTE — Assessment & Plan Note (Signed)
LUTS: Increased nocturia without fever chills or mental status changes.  Check UA urine culture.  Antibiotics if indicated. Skin lesion: Patient constantly picking at skin lesion, it is right from the nose, no ulcers, denies bleeding.    Advised pt's husband we could send her  for further evaluation, although I do not think she has a malignancy but that can only be ruled out with a biopsy.  For now we agreed on observation.  Chart review: Saw plastic surgery 07/31/2021, he biopsied both lesion of the nose and the chin.  Pathology: 1. Skin , right nose BENIGN EPIDERMAL HYPERPLASIA WITH HYPERKERATOSIS, LATERAL AND DEEP MARGINS INVOLVED 2. Skin , left chin PRURIGO NODULARIS OVERLYING EPIDERMOID CYST, SCARRED, DEEP MARGIN INVOLVED

## 2022-05-25 LAB — URINALYSIS, ROUTINE W REFLEX MICROSCOPIC
Bilirubin Urine: NEGATIVE
Hgb urine dipstick: NEGATIVE
Ketones, ur: NEGATIVE
Leukocytes,Ua: NEGATIVE
Nitrite: NEGATIVE
RBC / HPF: NONE SEEN (ref 0–?)
Specific Gravity, Urine: 1.025 (ref 1.000–1.030)
Total Protein, Urine: NEGATIVE
Urine Glucose: NEGATIVE
Urobilinogen, UA: 0.2 (ref 0.0–1.0)
pH: 6 (ref 5.0–8.0)

## 2022-05-25 LAB — URINE CULTURE
MICRO NUMBER:: 14766300
SPECIMEN QUALITY:: ADEQUATE

## 2022-05-26 IMAGING — MG DIGITAL SCREENING BILAT W/ TOMO W/ CAD
6 of 12 series · 6 of 36 positions shown · non-contrast
Comparison: Previous exam(s).

CLINICAL DATA: Screening.

EXAM:
DIGITAL SCREENING BILATERAL MAMMOGRAM WITH TOMO AND CAD

[L CC synth-2D]
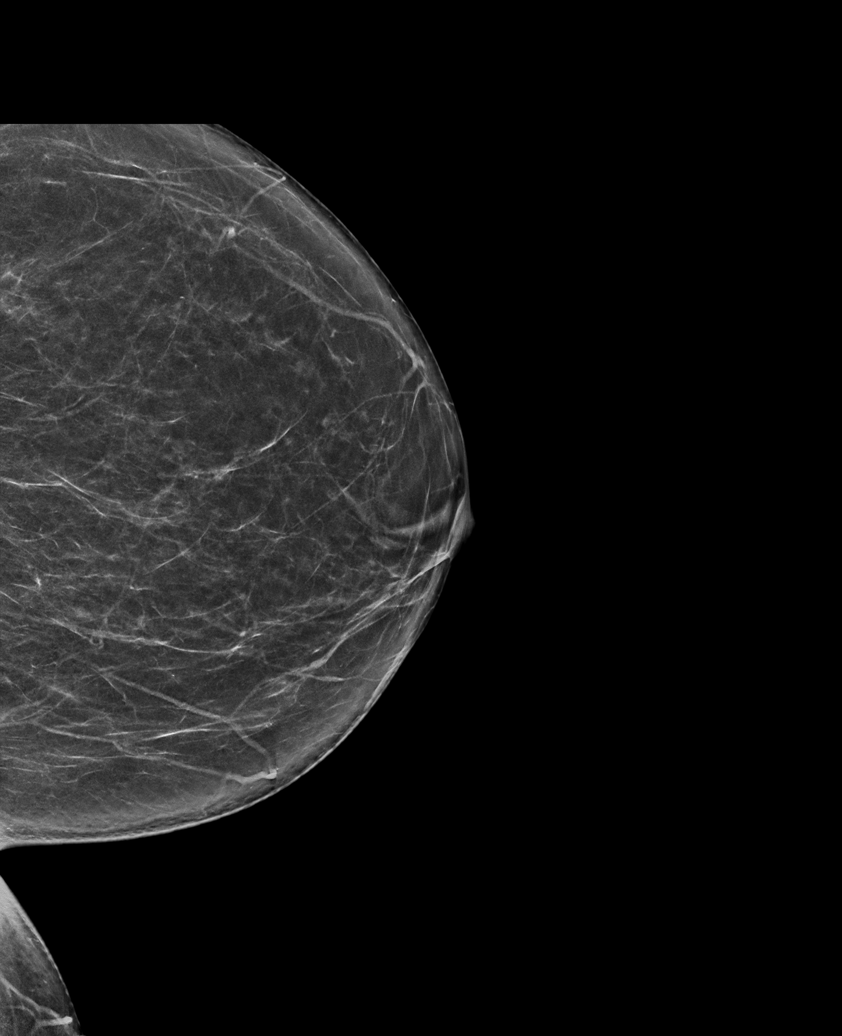

[R MLO synth-2D]
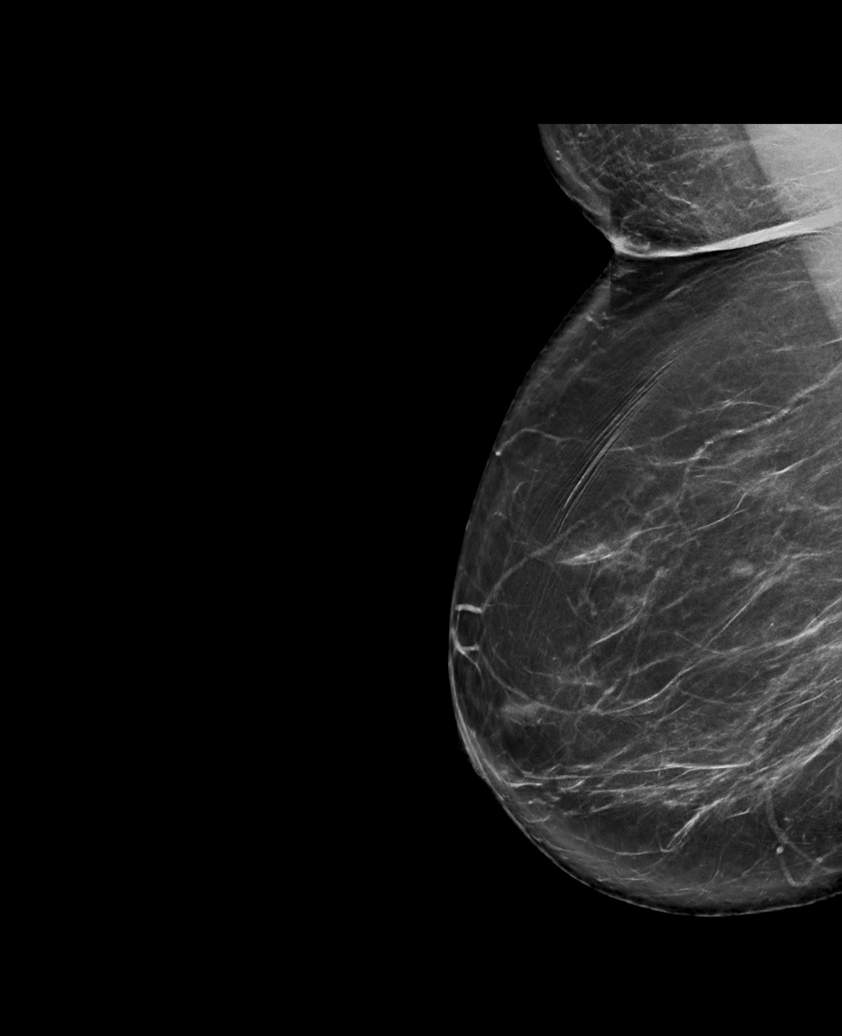

[R CC synth-2D (1 of 2)]
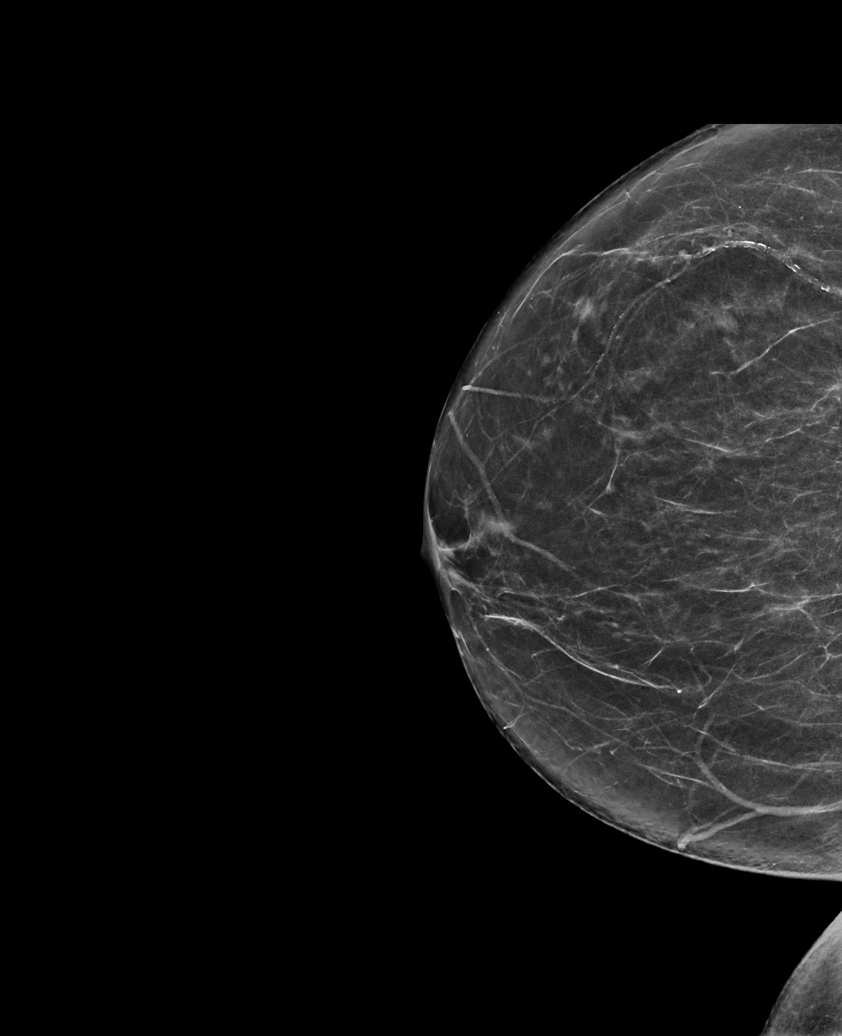

[L MLO synth-2D (1 of 2)]
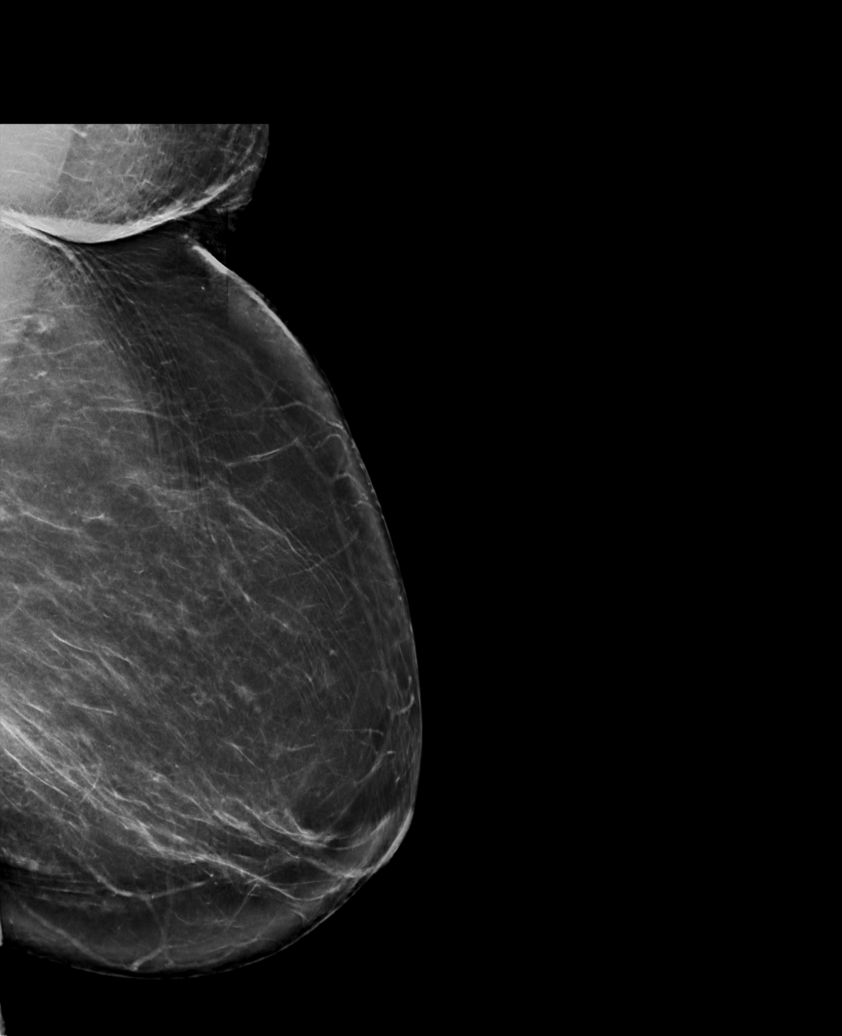

[R CC synth-2D (2 of 2)]
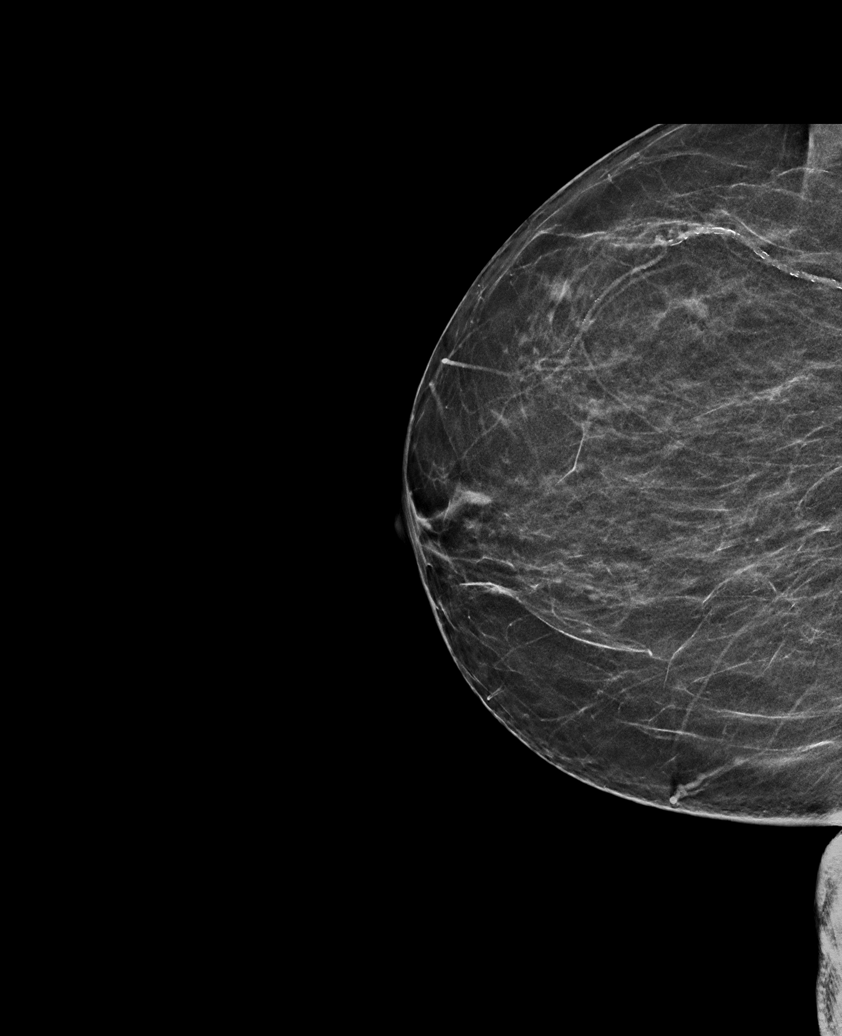

[L MLO synth-2D (2 of 2)]
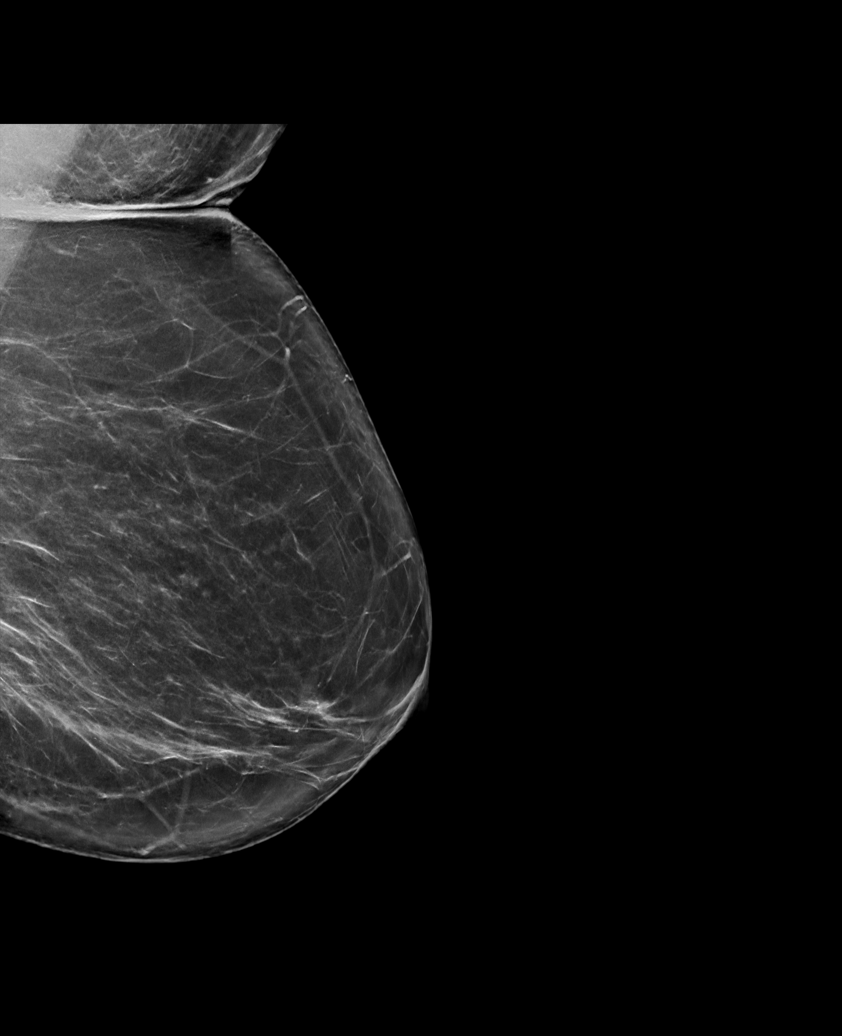

[6 of 36 positions shown; findings below may reference images not displayed]

ACR Breast Density Category b: There are scattered areas of
fibroglandular density.
FINDINGS: There are no findings suspicious for malignancy. Images were
processed with CAD.
IMPRESSION: No mammographic evidence of malignancy. A result letter of this
screening mammogram will be mailed directly to the patient.

RECOMMENDATION:
Screening mammogram in one year. (Code:[id number])

BI-RADS CATEGORY  1: Negative.

## 2022-06-09 ENCOUNTER — Encounter: Payer: Self-pay | Admitting: Family Medicine

## 2022-06-09 ENCOUNTER — Ambulatory Visit (INDEPENDENT_AMBULATORY_CARE_PROVIDER_SITE_OTHER): Payer: Medicare Other | Admitting: Family Medicine

## 2022-06-09 VITALS — BP 125/74 | HR 84

## 2022-06-09 DIAGNOSIS — L989 Disorder of the skin and subcutaneous tissue, unspecified: Secondary | ICD-10-CM | POA: Diagnosis not present

## 2022-06-09 NOTE — Patient Instructions (Signed)
Referral to dermatology to further manage

## 2022-06-09 NOTE — Progress Notes (Signed)
   Acute Office Visit  Subjective:     Patient ID: Angel Green, female    DOB: 11-28-1940, 82 y.o.   MRN: [id number]  Chief Complaint  Patient presents with   Skin Problem    HPI Patient is in today for skin lesion. Here with husband and daughter.   Family reports she has had a raised lesion under her nose for the past several months. Progressively getting larger and she is now picking it at it regularly. Denies pain, drainage, erythema, warmth, fevers, chills, rashes.     ROS All review of systems negative except what is listed in the HPI      Objective:    BP 125/74   Pulse 84   SpO2 100%    Physical Exam Vitals reviewed.  Constitutional:      Appearance: Normal appearance.  Skin:    General: Skin is warm and dry.     Comments: See picture of lesion near nose, right  Neurological:     Mental Status: She is alert. Mental status is at baseline. She is disoriented.  Psychiatric:        Mood and Affect: Mood normal.        Behavior: Behavior normal.        Thought Content: Thought content normal.        Judgment: Judgment normal.                No results found for any visits on 06/09/22.      Assessment & Plan:   Problem List Items Addressed This Visit   None Visit Diagnoses     Skin lesion of face    -  Primary Given location and presentation. Recommend dermatology evaluation/removal.     Relevant Orders   Ambulatory referral to Dermatology       No orders of the defined types were placed in this encounter.   Return if symptoms worsen or fail to improve.  Clayborne Dana, NP

## 2022-06-17 DIAGNOSIS — D492 Neoplasm of unspecified behavior of bone, soft tissue, and skin: Secondary | ICD-10-CM | POA: Diagnosis not present

## 2022-06-17 DIAGNOSIS — C4402 Squamous cell carcinoma of skin of lip: Secondary | ICD-10-CM | POA: Diagnosis not present

## 2022-06-17 DIAGNOSIS — L821 Other seborrheic keratosis: Secondary | ICD-10-CM | POA: Diagnosis not present

## 2022-06-22 DIAGNOSIS — I129 Hypertensive chronic kidney disease with stage 1 through stage 4 chronic kidney disease, or unspecified chronic kidney disease: Secondary | ICD-10-CM | POA: Diagnosis not present

## 2022-06-22 DIAGNOSIS — D631 Anemia in chronic kidney disease: Secondary | ICD-10-CM | POA: Diagnosis not present

## 2022-06-22 DIAGNOSIS — E785 Hyperlipidemia, unspecified: Secondary | ICD-10-CM | POA: Diagnosis not present

## 2022-06-22 DIAGNOSIS — Z6841 Body Mass Index (BMI) 40.0 and over, adult: Secondary | ICD-10-CM | POA: Diagnosis not present

## 2022-06-22 DIAGNOSIS — E1122 Type 2 diabetes mellitus with diabetic chronic kidney disease: Secondary | ICD-10-CM | POA: Diagnosis not present

## 2022-06-22 DIAGNOSIS — N1831 Chronic kidney disease, stage 3a: Secondary | ICD-10-CM | POA: Diagnosis not present

## 2022-06-22 DIAGNOSIS — F0390 Unspecified dementia without behavioral disturbance: Secondary | ICD-10-CM | POA: Diagnosis not present

## 2022-06-22 DIAGNOSIS — N2581 Secondary hyperparathyroidism of renal origin: Secondary | ICD-10-CM | POA: Diagnosis not present

## 2022-06-30 ENCOUNTER — Encounter: Payer: Self-pay | Admitting: Endocrinology

## 2022-06-30 ENCOUNTER — Ambulatory Visit (INDEPENDENT_AMBULATORY_CARE_PROVIDER_SITE_OTHER): Payer: Medicare Other | Admitting: Endocrinology

## 2022-06-30 ENCOUNTER — Telehealth: Payer: Self-pay | Admitting: Endocrinology

## 2022-06-30 ENCOUNTER — Other Ambulatory Visit: Payer: Medicare Other

## 2022-06-30 VITALS — BP 122/80 | HR 90 | Ht 64.0 in | Wt 218.6 lb

## 2022-06-30 DIAGNOSIS — E1165 Type 2 diabetes mellitus with hyperglycemia: Secondary | ICD-10-CM | POA: Diagnosis not present

## 2022-06-30 DIAGNOSIS — Z794 Long term (current) use of insulin: Secondary | ICD-10-CM | POA: Diagnosis not present

## 2022-06-30 DIAGNOSIS — N1831 Chronic kidney disease, stage 3a: Secondary | ICD-10-CM

## 2022-06-30 LAB — POCT GLYCOSYLATED HEMOGLOBIN (HGB A1C): Hemoglobin A1C: 6.6 % — AB (ref 4.0–5.6)

## 2022-06-30 LAB — GLUCOSE, POCT (MANUAL RESULT ENTRY): POC Glucose: 208 mg/dl — AB (ref 70–99)

## 2022-06-30 MED ORDER — SURE COMFORT PEN NEEDLES 31G X 5 MM MISC: 2 refills | Status: DC

## 2022-06-30 MED ORDER — TRULICITY 1.5 MG/0.5ML ~~LOC~~ SOAJ
SUBCUTANEOUS | 1 refills | Status: DC
Start: 2022-06-30 — End: 2022-08-03

## 2022-06-30 NOTE — Patient Instructions (Addendum)
Trulicity 1.5 mg weekly  Check blood sugars on waking up   Also check blood sugars about 2 hours after meals and do this after different meals by rotation  Recommended blood sugar levels on waking up are 90-130 and about 2 hours after meal is 130-180  Please bring your blood sugar monitor to each visit, thank you

## 2022-06-30 NOTE — Telephone Encounter (Signed)
Per patient spouse he would like sent to [facility]

## 2022-06-30 NOTE — Telephone Encounter (Signed)
done

## 2022-06-30 NOTE — Progress Notes (Signed)
Patient ID: Angel Green, female   DOB: [birth year], 82 y.o.   MRN: [id number]           Reason for Appointment: Type II Diabetes follow-up   History of Present Illness   Diagnosis date: 1999  Previous history:  Non-insulin hypoglycemic drugs previously used: Trulicity was started in April 2019 and she has been on 4.5 mg since 2021 Insulin was started in 2010  A1c range in the last few years is: 6.1-9.1  Recent history:     Non-insulin hypoglycemic drugs: Trulicity 0.75 mg weekly     Insulin regimen: Humalog mix 40 units with breakfast            Side effects from medications: None  Current self management, blood sugar patterns and problems identified:  A1c is 6.6  She is back on the freestyle libre version 2 sensor and not clear why she did not look into getting the [place] 3 upgrade as discussed 3 months ago with her family, this is still reading falsely low according to the family and about 60 mg lower than actual reading However unable to get latest data this month as her Josephine Igo is not linked and she did not bring her phone with her Also she is checking her sugars only part of the time with 42 % active CGM time, checking blood sugars primarily in the mornings and midday The family thinks that her blood sugars are frequently over 200 after all the meals and was 200 in the office today, however apparently her fasting readings are about 140 However on her sensor the blood sugars are above 180 only about once a day either breakfast or lunch and no data available after dinner No hypoglycemia Because of weight loss with 4.5 mg Trulicity she is only taking 0.75 mg now, appetite is normal  Exercise: none  Diet management: Eating 3 meals a day, no specific meal plan Her breakfast usually consists of 1 pancake, sausage Dinner usually 6-7 PM Last nutritional counseling 8/23  Interpretation of the freestyle libre version 2 sensor download as follows  Blood sugars overnight showed  readings near normal with average in the 80s part of the time and hypoglycemia on 1 night Blood sugar data is not available generally after about 4 PM  She generally has readings averaging about 170 after her first meal midday and this is variable Blood sugar data is not available for meals later in the day Data is missing after 06/23/2022 No hypoglycemia in the afternoons  AVERAGE 119 Time in target 85% with 7% readings below 70  Previous data:  CGM use % of time 62  2-week average/GV   Time in range        77%  % Time Above 180 19  % Time above 250 2  % Time Below 70 2     PRE-MEAL Fasting Lunch Dinner Bedtime Overall  Glucose range:       Averages: 102       POST-MEAL PC Breakfast PC Lunch PC Dinner  Glucose range:     Averages: 192  167    Weight control:  Wt Readings from Last 3 Encounters:  06/30/22 218 lb 9.6 oz (99.2 kg)  05/04/22 210 lb 4 oz (95.4 kg)  04/09/22 216 lb 9.6 oz (98.2 kg)            Diabetes labs:  Lab Results  Component Value Date   HGBA1C 6.6 (A) 06/30/2022   HGBA1C 6.4 04/06/2022   HGBA1C  7.9 (H) 12/18/2021   Lab Results  Component Value Date   MICROALBUR <0.7 04/09/2022   LDLCALC 76 09/07/2021   CREATININE 1.59 (H) 04/12/2022    Lab Results  Component Value Date   FRUCTOSAMINE 299 (H) 06/26/2020   FRUCTOSAMINE 370 (H) 04/22/2015     Allergies as of 06/30/2022       Reactions   Dust Mite Mixed Allergen Ext [mite (d. Farinae)]         Medication List        Accurate as of Jun 30, 2022  2:31 PM. If you have any questions, ask your nurse or doctor.          STOP taking these medications    Trulicity 0.75 MG/0.5ML Sopn Generic drug: Dulaglutide Replaced by: Trulicity 1.5 MG/0.5ML Sopn Stopped by: Reather Littler, MD       TAKE these medications    amLODipine 5 MG tablet Commonly known as: NORVASC Take 1 tablet (5 mg total) by mouth daily.   aspirin 81 MG chewable tablet Chew 81 mg by mouth daily.    atorvastatin 10 MG tablet Commonly known as: LIPITOR Take 1 tablet (10 mg total) by mouth daily.   azelastine 0.1 % nasal spray Commonly known as: ASTELIN Place into both nostrils 2 (two) times daily. Use in each nostril as directed   B-12 PO Take 1 tablet by mouth daily. Takes 5000 mcg by mouth daily.   CENTRUM SILVER PO Take 1 tablet by mouth daily.   donepezil 10 MG tablet Commonly known as: ARICEPT Take 1 tablet (10 mg total) by mouth at bedtime.   DULoxetine 20 MG capsule Commonly known as: CYMBALTA Take 1 capsule (20 mg total) by mouth daily.   EPINEPHrine 0.3 mg/0.3 mL Soaj injection Commonly known as: EPI-PEN Inject 0.3 mg into the muscle once. Reported on 06/30/2015   FISH OIL PO Take 1 capsule by mouth daily in the afternoon.   freestyle lancets Use to monitor blood sugars 4 times per day, once before each meal and once at bedtime   FreeStyle Libre 2 Sensor Misc 1 Device by Does not apply route every 14 (fourteen) days.   FreeStyle Lite Devi Use to monitor your blood sugars   FREESTYLE LITE test strip Generic drug: glucose blood Use to monitor your blood sugars 4 times per day; E11.9   furosemide 20 MG tablet Commonly known as: LASIX Take 2 tablets (40 mg total) by mouth daily.   Insulin Lispro Prot & Lispro (75-25) 100 UNIT/ML Kwikpen Commonly known as: HumaLOG Mix 75/25 KwikPen Inject 50 Units into the skin daily with breakfast. And pen needles 1/day What changed: how much to take   loratadine 10 MG tablet Commonly known as: CLARITIN TAKE 1 TABLET DAILY   memantine 10 MG tablet Commonly known as: NAMENDA Take 1 tablet (10 mg total) by mouth 2 (two) times daily.   montelukast 10 MG tablet Commonly known as: SINGULAIR Take 10 mg by mouth at bedtime.   potassium chloride 10 MEQ tablet Commonly known as: KLOR-CON M Take 2 tablets (20 mEq total) by mouth daily.   QUEtiapine 50 MG tablet Commonly known as: SEROQUEL Take 1 tablet (50 mg total)  by mouth at bedtime.   Sure Comfort Pen Needles 31G X 5 MM Misc Generic drug: Insulin Pen Needle Use to inject insulin 1 time per day.   Trulicity 1.5 MG/0.5ML Sopn Generic drug: Dulaglutide Inject weekly Replaces: Trulicity 0.75 MG/0.5ML Sopn Started by: Reather Littler, MD  Allergies:  Allergies  Allergen Reactions   Dust Mite Mixed Allergen Ext [Mite (D. Farinae)]     Past Medical History:  Diagnosis Date   Allergy    Dust Mites   Ankle pain    chronic   Diabetes mellitus, type 2 (HCC)    Hyperlipidemia    Hypertension    Memory loss    Osteoarthritis    RENAL INSUFFICIENCY, CHRONIC 04/16/2010   Rhinitis    allergic nos    Past Surgical History:  Procedure Laterality Date   CATARACT EXTRACTION     CHOLECYSTECTOMY     dental implants     PARTIAL HYSTERECTOMY     in her 30s    Family History  Problem Relation Age of Onset   Healthy Brother    Healthy Son    Healthy Son    Other Mother        unsure of history - "old age"   Other Father        unsure of history - "old age"   Heart attack Neg Hx    Colon cancer Neg Hx    Breast cancer Neg Hx    Stroke Neg Hx     Social History:  reports that she has never smoked. She has never used smokeless tobacco. She reports that she does not drink alcohol and does not use drugs.  Review of Systems:  Last diabetic eye exam date 3/24, reportedly no retinopathy  Last foot exam date: 9/22  Hypertension:   Treatment includes losartan 50 mg daily  BP Readings from Last 3 Encounters:  06/30/22 122/80  06/09/22 125/74  05/24/22 132/80   She has had CKD followed by nephrologist about once a year, not on Kerendia or SGLT2 drugs No microalbuminuria  Lab Results  Component Value Date   CREATININE 1.59 (H) 04/12/2022   CREATININE 1.60 (H) 04/06/2022   CREATININE 2.16 (H) 03/05/2022    Lipid management: Treated with atorvastatin 10 mg daily prescribed by her PCP    Lab Results  Component Value Date    CHOL 157 09/07/2021   CHOL 134 06/17/2020   CHOL 124 03/24/2018   Lab Results  Component Value Date   HDL 57.30 09/07/2021   HDL 49.50 06/17/2020   HDL 41.10 03/24/2018   Lab Results  Component Value Date   LDLCALC 76 09/07/2021   LDLCALC 62 06/17/2020   LDLCALC 64 03/24/2018   Lab Results  Component Value Date   TRIG 123.0 09/07/2021   TRIG 114.0 06/17/2020   TRIG 96.0 03/24/2018   Lab Results  Component Value Date   CHOLHDL 3 09/07/2021   CHOLHDL 3 06/17/2020   CHOLHDL 3 03/24/2018   No results found for: "LDLDIRECT"     Examination:   BP 122/80 (BP Location: Left Arm, Patient Position: Sitting, Cuff Size: Large)   Pulse 90   Ht 5\' 4"  (1.626 m)   Wt 218 lb 9.6 oz (99.2 kg)   SpO2 96%   BMI 37.52 kg/m   Body mass index is 37.52 kg/m.    ASSESSMENT/ PLAN:    Diabetes type 2 with severe obesity, hypertension and hyperlipidemia:   Current regimen: Premixed insulin once a day in the morning and Trulicity 0.75 mg weekly  See history of present illness for detailed discussion of current diabetes management, blood sugar patterns and problems identified  A1c is 6.6  Blood glucose levels are looking fairly good although difficult to assess objectively because of falsely low readings on  her sensor and inadequate data She does appear to have postprandial hyperglycemia not covered with her premixed insulin in the morning May have been doing better with higher dose of Trulicity but suppressed appetite  CKD stable: Continue follow-up with nephrologist regarding CKD, recent follow-up notes reviewed, last GFR 43  Recommendations for diabetes:  Start using libre 3 and she will contact her supplier to get the new sensors, may need to purchase meter out-of-pocket if not approved by insurance Will also send online prescription through [facility] Keep a record of blood sugars with the fingersticks or bring meter on the next visit Go up to 1.5 mg Trulicity May  consider consultation with dietitian to review her meals Considering her age and comorbid conditions her overall level of control is still adequate Will also check fructosamine to correlate with her A1c on the next visit Follow-up with PCP regarding lipids    Patient Instructions  Trulicity 1.5 mg weekly  Check blood sugars on waking up   Also check blood sugars about 2 hours after meals and do this after different meals by rotation  Recommended blood sugar levels on waking up are 90-130 and about 2 hours after meal is 130-180  Please bring your blood sugar monitor to each visit, thank you  Total visit time for evaluation and management and counseling = 30 minutes  Kanya Potteiger 06/30/2022, 2:31 PM

## 2022-06-30 NOTE — Telephone Encounter (Signed)
MEDICATION: Pen Needles  PHARMACY:  [facility] - [place], [place] - [address], [place] [address] Phone: [phone]  Fax: [phone]   HAS THE PATIENT CONTACTED THEIR PHARMACY?    IS THIS A 90 DAY SUPPLY : yes  IS PATIENT OUT OF MEDICATION:   IF NOT; HOW MUCH IS LEFT:   LAST APPOINTMENT DATE: @5 /09/2022  NEXT APPOINTMENT DATE:@7 /31/2024

## 2022-07-01 ENCOUNTER — Encounter: Payer: Self-pay | Admitting: Endocrinology

## 2022-07-14 DIAGNOSIS — C4402 Squamous cell carcinoma of skin of lip: Secondary | ICD-10-CM | POA: Diagnosis not present

## 2022-07-18 ENCOUNTER — Other Ambulatory Visit: Payer: Self-pay | Admitting: Internal Medicine

## 2022-07-20 ENCOUNTER — Other Ambulatory Visit: Payer: Self-pay

## 2022-07-20 MED ORDER — POTASSIUM CHLORIDE CRYS ER 10 MEQ PO TBCR: 20.0000 meq | EXTENDED_RELEASE_TABLET | Freq: Every day | ORAL | 1 refills | Status: DC

## 2022-08-03 ENCOUNTER — Other Ambulatory Visit: Payer: Self-pay

## 2022-08-03 MED ORDER — TRULICITY 1.5 MG/0.5ML ~~LOC~~ SOAJ: SUBCUTANEOUS | 1 refills | Status: DC

## 2022-08-24 ENCOUNTER — Emergency Department (HOSPITAL_COMMUNITY): Payer: Medicare Other

## 2022-08-24 ENCOUNTER — Emergency Department (HOSPITAL_COMMUNITY)
Admission: EM | Admit: 2022-08-24 | Discharge: 2022-08-24 | Disposition: A | Discharge status: Home / Self Care | Payer: Medicare Other | Attending: Emergency Medicine | Admitting: Emergency Medicine

## 2022-08-24 ENCOUNTER — Other Ambulatory Visit: Payer: Self-pay

## 2022-08-24 ENCOUNTER — Encounter (HOSPITAL_COMMUNITY): Payer: Self-pay

## 2022-08-24 DIAGNOSIS — F0390 Unspecified dementia without behavioral disturbance: Secondary | ICD-10-CM | POA: Insufficient documentation

## 2022-08-24 DIAGNOSIS — Z794 Long term (current) use of insulin: Secondary | ICD-10-CM | POA: Diagnosis not present

## 2022-08-24 DIAGNOSIS — R531 Weakness: Secondary | ICD-10-CM | POA: Diagnosis not present

## 2022-08-24 DIAGNOSIS — R4182 Altered mental status, unspecified: Secondary | ICD-10-CM | POA: Diagnosis not present

## 2022-08-24 DIAGNOSIS — I959 Hypotension, unspecified: Secondary | ICD-10-CM | POA: Diagnosis not present

## 2022-08-24 LAB — CBC WITH DIFFERENTIAL/PLATELET
Abs Immature Granulocytes: 0.01 10*3/uL (ref 0.00–0.07)
Basophils Absolute: 0 10*3/uL (ref 0.0–0.1)
Basophils Relative: 1 %
Eosinophils Absolute: 0.2 10*3/uL (ref 0.0–0.5)
Eosinophils Relative: 3 %
HCT: 42.8 % (ref 36.0–46.0)
Hemoglobin: 13.9 g/dL (ref 12.0–15.0)
Immature Granulocytes: 0 %
Lymphocytes Relative: 40 %
Lymphs Abs: 2.5 10*3/uL (ref 0.7–4.0)
MCH: 31.7 pg (ref 26.0–34.0)
MCHC: 32.5 g/dL (ref 30.0–36.0)
MCV: 97.5 fL (ref 80.0–100.0)
Monocytes Absolute: 0.4 10*3/uL (ref 0.1–1.0)
Monocytes Relative: 7 %
Neutro Abs: 3 10*3/uL (ref 1.7–7.7)
Neutrophils Relative %: 49 %
Platelets: 230 10*3/uL (ref 150–400)
RBC: 4.39 MIL/uL (ref 3.87–5.11)
RDW: 13.8 % (ref 11.5–15.5)
WBC: 6.2 10*3/uL (ref 4.0–10.5)
nRBC: 0 % (ref 0.0–0.2)

## 2022-08-24 LAB — COMPREHENSIVE METABOLIC PANEL
ALT: 22 U/L (ref 0–44)
AST: 23 U/L (ref 15–41)
Albumin: 3.3 g/dL — ABNORMAL LOW (ref 3.5–5.0)
Alkaline Phosphatase: 79 U/L (ref 38–126)
Anion gap: 9 (ref 5–15)
BUN: 12 mg/dL (ref 8–23)
CO2: 29 mmol/L (ref 22–32)
Calcium: 9.1 mg/dL (ref 8.9–10.3)
Chloride: 102 mmol/L (ref 98–111)
Creatinine, Ser: 1.61 mg/dL — ABNORMAL HIGH (ref 0.44–1.00)
GFR, Estimated: 31 mL/min — ABNORMAL LOW (ref >60)
Glucose, Bld: 158 mg/dL — ABNORMAL HIGH (ref 70–99)
Potassium: 4.1 mmol/L (ref 3.5–5.1)
Sodium: 140 mmol/L (ref 135–145)
Total Bilirubin: 0.4 mg/dL (ref 0.3–1.2)
Total Protein: 7.2 g/dL (ref 6.5–8.1)

## 2022-08-24 LAB — URINALYSIS, ROUTINE W REFLEX MICROSCOPIC
Bilirubin Urine: NEGATIVE
Glucose, UA: NEGATIVE mg/dL
Hgb urine dipstick: NEGATIVE
Ketones, ur: NEGATIVE mg/dL
Leukocytes,Ua: NEGATIVE
Nitrite: NEGATIVE
Protein, ur: NEGATIVE mg/dL
Specific Gravity, Urine: 1.017 (ref 1.005–1.030)
pH: 6 (ref 5.0–8.0)

## 2022-08-24 NOTE — ED Triage Notes (Addendum)
Pt BIBGEMS from home with c/o generalized weakness. Baseline is confused, oriented to self. Husband is primary caretaker.   150/90 Hr 70s Rr 12 CBG 185 99% RA  20 rac

## 2022-08-24 NOTE — ED Notes (Signed)
PT refused a in and out, has  been put on a bed pan to try and urinate

## 2022-08-24 NOTE — ED Provider Notes (Signed)
Patient signed out to me at 1600 by Dr. Lockie Mola pending labs and reassessment. Patient initially presented to the emergency department wit her husband for concern of generalized weakness. She does have a history of dementia and on arrival here her husband did report that she appeared to be back to her baseline.  On my evaluation, the patient is hemodynamically stable in no acute distress.  She has no focal neurologic deficits on exam and has no pain or any complaints.  The patient's labs and urine are within normal range, creatinine at baseline.  She is stable for discharge home with outpatient follow-up and were given strict return precautions.   Elayne Snare K, DO 08/24/22 1732

## 2022-08-24 NOTE — ED Provider Notes (Signed)
[place] EMERGENCY DEPARTMENT AT [facility] Provider Note   CSN: [id number] Arrival date & time: 08/24/22  1328     History  Chief Complaint  Patient presents with   Weakness    Angel Green is a 82 y.o. female.  Patient brought in by EMS for generalized weakness.  Patient has no complaints.  Has memory issues but she can tell me her name and where she has.  She denies any chest pain or abdominal pain or nausea or vomiting.  No pain with urination.  She denies any weakness.  Overall will need to obtain more history from family.  But she had normal vitals with EMS.  Normal EKG.  Normal neurological exam.  She has no complaints.  The history is provided by the patient and the EMS personnel.       Home Medications Prior to Admission medications   Medication Sig Start Date End Date Taking? Authorizing Provider  furosemide (LASIX) 20 MG tablet Take 2 tablets (40 mg total) by mouth daily. 07/20/22   Wanda Plump, MD  amLODipine (NORVASC) 5 MG tablet Take 1 tablet (5 mg total) by mouth daily. 01/26/22   Olive Bass, FNP  aspirin 81 MG chewable tablet Chew 81 mg by mouth daily.    [provider]  atorvastatin (LIPITOR) 10 MG tablet Take 1 tablet (10 mg total) by mouth daily. 04/02/22   Wanda Plump, MD  azelastine (ASTELIN) 0.1 % nasal spray Place into both nostrils 2 (two) times daily. Use in each nostril as directed    [provider]  Blood Glucose Monitoring Suppl (FREESTYLE LITE) DEVI Use to monitor your blood sugars 12/26/17   Romero Belling, MD  Continuous Blood Gluc Sensor (FREESTYLE LIBRE 2 SENSOR) MISC 1 Device by Does not apply route every 14 (fourteen) days. 03/11/22   Reather Littler, MD  Cyanocobalamin (B-12 PO) Take 1 tablet by mouth daily. Takes 5000 mcg by mouth daily.    [provider]  donepezil (ARICEPT) 10 MG tablet Take 1 tablet (10 mg total) by mouth at bedtime. 03/05/22   Wanda Plump, MD  Dulaglutide (TRULICITY) 1.5 MG/0.5ML  SOPN Inject weekly 08/03/22   Reather Littler, MD  DULoxetine (CYMBALTA) 20 MG capsule Take 1 capsule (20 mg total) by mouth daily. 05/04/22   Wanda Plump, MD  EPINEPHrine 0.3 mg/0.3 mL IJ SOAJ injection Inject 0.3 mg into the muscle once. Reported on 06/30/2015    [provider]  glucose blood (FREESTYLE LITE) test strip Use to monitor your blood sugars 4 times per day; E11.9 05/07/22   Reather Littler, MD  Insulin Lispro Prot & Lispro (HUMALOG MIX 75/25 KWIKPEN) (75-25) 100 UNIT/ML Kwikpen Inject 50 Units into the skin daily with breakfast. And pen needles 1/day Patient taking differently: Inject 40 Units into the skin daily with breakfast. And pen needles 1/day 03/05/22   Wanda Plump, MD  Lancets (FREESTYLE) lancets Use to monitor blood sugars 4 times per day, once before each meal and once at bedtime 12/26/17   Romero Belling, MD  loratadine (CLARITIN) 10 MG tablet TAKE 1 TABLET DAILY 03/22/22   Wanda Plump, MD  memantine (NAMENDA) 10 MG tablet Take 1 tablet (10 mg total) by mouth 2 (two) times daily. 10/21/21   Levert Feinstein, MD  montelukast (SINGULAIR) 10 MG tablet Take 10 mg by mouth at bedtime.    [provider]  Multiple Vitamins-Minerals (CENTRUM SILVER PO) Take 1 tablet by mouth daily.  [provider]  Omega-3 Fatty Acids (FISH OIL PO) Take 1 capsule by mouth daily in the afternoon.    [provider]  potassium chloride (KLOR-CON M) 10 MEQ tablet Take 2 tablets (20 mEq total) by mouth daily. 07/20/22   Wanda Plump, MD  QUEtiapine (SEROQUEL) 50 MG tablet Take 1 tablet (50 mg total) by mouth at bedtime. 03/09/22   Wanda Plump, MD  SURE COMFORT PEN NEEDLES 31G X 5 MM MISC Use to inject insulin 1 time per day. 06/30/22   Reather Littler, MD      Allergies    Dust mite mixed allergen ext [mite (d. farinae)]    Review of Systems   Review of Systems  Physical Exam Updated Vital Signs BP 131/73   Pulse 76   Temp (!) 97.5 F (36.4 C) (Oral)   Resp 16   SpO2 97%  Physical  Exam Vitals and nursing note reviewed.  Constitutional:      General: She is not in acute distress.    Appearance: She is well-developed. She is not ill-appearing.  HENT:     Head: Normocephalic and atraumatic.     Nose: Nose normal.  Eyes:     Extraocular Movements: Extraocular movements intact.     Conjunctiva/sclera: Conjunctivae normal.     Pupils: Pupils are equal, round, and reactive to light.  Cardiovascular:     Rate and Rhythm: Normal rate and regular rhythm.     Pulses: Normal pulses.     Heart sounds: Normal heart sounds. No murmur heard. Pulmonary:     Effort: Pulmonary effort is normal. No respiratory distress.     Breath sounds: Normal breath sounds.  Abdominal:     Palpations: Abdomen is soft.     Tenderness: There is no abdominal tenderness.  Musculoskeletal:        General: No swelling.     Cervical back: Normal range of motion and neck supple.  Skin:    General: Skin is warm and dry.     Capillary Refill: Capillary refill takes less than 2 seconds.  Neurological:     General: No focal deficit present.     Mental Status: She is alert.     Cranial Nerves: No cranial nerve deficit.     Sensory: No sensory deficit.     Motor: No weakness.     Coordination: Coordination normal.  Psychiatric:        Mood and Affect: Mood normal.     ED Results / Procedures / Treatments   Labs (all labs ordered are listed, but only abnormal results are displayed) Labs Reviewed  CBC WITH DIFFERENTIAL/PLATELET  URINALYSIS, ROUTINE W REFLEX MICROSCOPIC  COMPREHENSIVE METABOLIC PANEL    EKG EKG Interpretation Date/Time:  Tuesday August 24 2022 13:55:12 EDT Ventricular Rate:  79 PR Interval:  152 QRS Duration:  82 QT Interval:  407 QTC Calculation: 467 R Axis:   -13  Text Interpretation: Sinus rhythm Abnormal R-wave progression, early transition Left ventricular hypertrophy Confirmed by Virgina Norfolk [phone]) on 08/24/2022 1:59:09 PM  Radiology DG Chest Portable 1  View  Result Date: 08/24/2022 CLINICAL DATA:  Weakness EXAM: PORTABLE CHEST 1 VIEW COMPARISON:  None Available. FINDINGS: The heart size and mediastinal contours are within normal limits. Both lungs are clear. The visualized skeletal structures are unremarkable. IMPRESSION: No acute abnormality of the lungs in rotated AP portable exam. Electronically Signed   By: Jearld Lesch M.D.   On: 08/24/2022 15:45   CT  Head Wo Contrast  Result Date: 08/24/2022 CLINICAL DATA:  Mental status change, unknown cause EXAM: CT HEAD WITHOUT CONTRAST TECHNIQUE: Contiguous axial images were obtained from the base of the skull through the vertex without intravenous contrast. RADIATION DOSE REDUCTION: This exam was performed according to the departmental dose-optimization program which includes automated exposure control, adjustment of the mA and/or kV according to patient size and/or use of iterative reconstruction technique. COMPARISON:  CT Head 08/11/21, MR head 08/11/21 FINDINGS: Brain: No evidence of acute infarction, hemorrhage, hydrocephalus, extra-axial collection or mass lesion/mass effect. There is advanced generalized volume loss that disproportionately defects the right temporal lobe. Vascular: No hyperdense vessel or unexpected calcification. Skull: Normal. Negative for fracture or focal lesion. Sinuses/Orbits: No middle ear or mastoid effusion. Paranasal sinuses are clear. Bilateral lens replacement. Orbits are otherwise unremarkable. Other: None. IMPRESSION: 1. No acute intracranial abnormality. 2. Advanced generalized volume loss that disproportionately affects the right temporal lobe, unchanged from prior exam Electronically Signed   By: Lorenza Cambridge M.D.   On: 08/24/2022 15:08    Procedures Procedures    Medications Ordered in ED Medications - No data to display  ED Course/ Medical Decision Making/ A&P                             Medical Decision Making Amount and/or Complexity of Data Reviewed Labs:  ordered. Radiology: ordered.   Kinzley Fugua is here for generalized weakness.  Family states that patient was refusing to get up this morning.  Eventually was able go to the bathroom but she seemed more withdrawn than normal.  Typically does not use a walker at all.  Family states that she seemed like she just had 1 really do anything.  Is hard to get her around this morning.  Overall I will have family come and see her but she appears to be doing well with a normal neurologic exam.  She has normal vitals.  EKG shows sinus rhythm.  No ischemic changes.  Will do general workup to look for electrolyte abnormality, dehydration, infection will get head CT.  She has memory issues at baseline.  No significant leukocytosis or anemia.  CMP and urinalysis are pending at time of handoff.  Chest x-ray with no evidence of pneumonia.  Head CT is unremarkable.  Overall family feels like she is doing well and I think she can be discharged  antibiotics for possible UTI.  This chart was dictated using voice recognition software.  Despite best efforts to proofread,  errors can occur which can change the documentation meaning.         Final Clinical Impression(s) / ED Diagnoses Final diagnoses:  Weakness    Rx / DC Orders ED Discharge Orders     None         Virgina Norfolk, DO 08/24/22 1601

## 2022-08-24 NOTE — Discharge Instructions (Signed)
You were seen in the emergency department for your weakness.  Your workup showed no signs of anemia, dehydration, abnormal electrolytes or infection.  Is unclear what is causing your symptoms at this time but you can follow-up with your primary doctor to have your symptoms rechecked.  You should return to the emergency department if you are having progressively worsening weakness and are unable to get up out of bed, you have numbness or weakness on one side of the body compared to the other, you are having fevers or if you have any other new or concerning symptoms.

## 2022-08-30 ENCOUNTER — Telehealth: Payer: Self-pay

## 2022-08-30 NOTE — Telephone Encounter (Signed)
Transition Care Management Follow-up Telephone Call Date of discharge and from where: 08/24/2022 [facility] How have you been since you were released from the hospital? Patient is feeling much better. Any questions or concerns? No  Items Reviewed: Did the pt receive and understand the discharge instructions provided? Yes  Medications obtained and verified? Yes  Other? No  Any new allergies since your discharge? No  Dietary orders reviewed? Yes Do you have support at home? Yes   Follow up appointments reviewed:  PCP Hospital f/u appt confirmed? Yes  Scheduled to see Willow Ora, MD on 09/03/2022 @ [facility] Specialist Hospital f/u appt confirmed? Yes  Scheduled to see Reather Littler, MD on 09/22/2022 @ [facility]. Are transportation arrangements needed? No  If their condition worsens, is the pt aware to call PCP or go to the Emergency Dept.? Yes Was the patient provided with contact information for the PCP's office or ED? Yes Was to pt encouraged to call back with questions or concerns? Yes  Malie Kashani [facility]  [facility] Population Health Community Resource Care Guide   [email or web address]  ?? [phone]   Website: [email or web address]  [email or web address]

## 2022-08-31 ENCOUNTER — Other Ambulatory Visit: Payer: Self-pay | Admitting: Endocrinology

## 2022-09-03 ENCOUNTER — Ambulatory Visit: Payer: Medicare Other | Admitting: Internal Medicine

## 2022-09-06 ENCOUNTER — Encounter: Payer: Self-pay | Admitting: Internal Medicine

## 2022-09-22 ENCOUNTER — Ambulatory Visit: Payer: Medicare Other | Admitting: Endocrinology

## 2022-09-30 ENCOUNTER — Encounter: Payer: Self-pay | Admitting: Endocrinology

## 2022-09-30 ENCOUNTER — Ambulatory Visit (INDEPENDENT_AMBULATORY_CARE_PROVIDER_SITE_OTHER): Payer: Medicare Other | Admitting: Endocrinology

## 2022-09-30 VITALS — BP 138/80 | HR 95 | Ht 64.0 in | Wt 215.6 lb

## 2022-09-30 DIAGNOSIS — Z794 Long term (current) use of insulin: Secondary | ICD-10-CM

## 2022-09-30 DIAGNOSIS — E1165 Type 2 diabetes mellitus with hyperglycemia: Secondary | ICD-10-CM

## 2022-09-30 LAB — POCT GLYCOSYLATED HEMOGLOBIN (HGB A1C): Hemoglobin A1C: 7.5 % — AB (ref 4.0–5.6)

## 2022-09-30 MED ORDER — INSULIN LISPRO PROT & LISPRO (75-25 MIX) 100 UNIT/ML KWIKPEN: PEN_INJECTOR | SUBCUTANEOUS | 3 refills | Status: DC

## 2022-09-30 NOTE — Progress Notes (Addendum)
Outpatient Endocrinology Note [place] , MD  09/30/22  Patient's Name: Angel Green    DOB: [birth year]    MRN: [id number]                                                    REASON OF VISIT: Follow up of type 2 diabetes mellitus  PCP: Wanda Plump, MD  HISTORY OF PRESENT ILLNESS:   Angel Green is a 82 y.o. old female with past medical history listed below, is here for follow up  type 2 diabetes mellitus.   Pertinent Diabetes History: He was diagnosed with type 2 diabetes mellitus in 1999.  She has been on insulin therapy from 2010 and Trulicity was started in 2019.  Patient was last seen by Dr. Lucianne Muss in May 2024.  Chronic Diabetes Complications : Retinopathy: no. Last ophthalmology exam was done on 04/2022, reportedly. Nephropathy: CKD, following with nephrology. Peripheral neuropathy:no Coronary artery disease: no Stroke: no  Relevant comorbidities and cardiovascular risk factors: Obesity: yes Body mass index is 37.01 kg/m.  Hypertension: yes Hyperlipidemia. Yes, on a statin.  Current / Home Diabetic regimen includes: Humulin mix 75/25 : 40 units daily in the morning with breakfast. Trulicity 1.5 mg weekly.  Prior diabetic medications: She used to be on Trulicity 4.5 mg weekly was decreased due to weight loss.  Glycemic data:   She has freestyle libre 2 did not bring in the clinic.  He has not been using Libre 2 for about a month.  She has been checking with glucometer.  Glucometer data downloaded and reviewed in the clinic today.  Fasting blood sugar mostly high in the range of 200s , they are 265, 292, 215, 282, 346.  Blood sugar in the afternoon mostly acceptable 191, 138, 197, 125 she has been very taking in the afternoon and bedtime.  She is mostly taking in the morning before breakfast.  No hypoglycemia.  Hypoglycemia: Patient has no hypoglycemic episodes. Patient has hypoglycemia awareness.  Factors modifying glucose control: 1.  Diabetic diet assessment:  3 meals a day last meal of the day around 5 to 6 PM.  She eats breakfast around 11 AM.  Denies bedtime snack.  2.  Staying active or exercising: No formal exercise.  3.  Medication compliance: compliant all Of the time.  Interval history 09/30/22 Patient was using freestyle libre 2 however has not been using for over a month.  Not able to apply the libre 2.  She has been using libre 2 with phone.  She did not bring the phone in the clinic today.  Patient is wondering about libre 3 CGM.  Patient is accompanied by her husband and caregiver.  Glucometer data as reviewed above.  She is mostly having hyperglycemia in the morning before breakfast.  REVIEW OF SYSTEMS As per history of present illness.   PAST MEDICAL HISTORY: Past Medical History:  Diagnosis Date   Allergy    Dust Mites   Ankle pain    chronic   Diabetes mellitus, type 2 (HCC)    Hyperlipidemia    Hypertension    Memory loss    Osteoarthritis    RENAL INSUFFICIENCY, CHRONIC 04/16/2010   Rhinitis    allergic nos    PAST SURGICAL HISTORY: Past Surgical History:  Procedure Laterality Date   CATARACT EXTRACTION  CHOLECYSTECTOMY     dental implants     PARTIAL HYSTERECTOMY     in her 30s    ALLERGIES: Allergies  Allergen Reactions   Dust Mite Mixed Allergen Ext [Mite (D. Farinae)]     FAMILY HISTORY:  Family History  Problem Relation Age of Onset   Healthy Brother    Healthy Son    Healthy Son    Other Mother        unsure of history - "old age"   Other Father        unsure of history - "old age"   Heart attack Neg Hx    Colon cancer Neg Hx    Breast cancer Neg Hx    Stroke Neg Hx     SOCIAL HISTORY: Social History   Socioeconomic History   Marital status: Married    Spouse name: Not on file   Number of children: 2   Years of education: some college   Highest education level: Not on file  Occupational History   Occupation:  retired  Tobacco Use   Smoking status: Never   Smokeless  tobacco: Never  Vaping Use   Vaping status: Never Used  Substance and Sexual Activity   Alcohol use: No    Alcohol/week: 0.0 standard drinks of alcohol   Drug use: No   Sexual activity: Yes    Partners: Male  Other Topics Concern   Not on file  Social History Narrative   Husband is a Optician, dispensing.   Right-handed.   1 cup caffeine daily.   Lives at home with husband.          Social Determinants of Health   Financial Resource Strain: Low Risk  (08/17/2021)   Overall Financial Resource Strain (CARDIA)    Difficulty of Paying Living Expenses: Not very hard  Food Insecurity: No Food Insecurity (02/11/2022)   Hunger Vital Sign    Worried About Running Out of Food in the Last Year: Never true    Ran Out of Food in the Last Year: Never true  Transportation Needs: No Transportation Needs (02/11/2022)   PRAPARE - Administrator, Civil Service (Medical): No    Lack of Transportation (Non-Medical): No  Physical Activity: Inactive (05/18/2022)   Exercise Vital Sign    Days of Exercise per Week: 0 days    Minutes of Exercise per Session: 0 min  Stress: Not on file  Social Connections: Not on file    MEDICATIONS:  Current Outpatient Medications  Medication Sig Dispense Refill   amLODipine (NORVASC) 5 MG tablet Take 1 tablet (5 mg total) by mouth daily. 90 tablet 1   aspirin 81 MG chewable tablet Chew 81 mg by mouth daily.     atorvastatin (LIPITOR) 10 MG tablet Take 1 tablet (10 mg total) by mouth daily. 90 tablet 1   azelastine (ASTELIN) 0.1 % nasal spray Place into both nostrils 2 (two) times daily. Use in each nostril as directed     Blood Glucose Monitoring Suppl (FREESTYLE LITE) DEVI Use to monitor your blood sugars 1 each 0   Continuous Blood Gluc Sensor (FREESTYLE LIBRE 2 SENSOR) MISC 1 Device by Does not apply route every 14 (fourteen) days. 6 each 3   Cyanocobalamin (B-12 PO) Take 1 tablet by mouth daily. Takes 5000 mcg by mouth daily.     donepezil (ARICEPT) 10 MG  tablet Take 1 tablet (10 mg total) by mouth at bedtime. 30 tablet 0   Dulaglutide (TRULICITY)  1.5 MG/0.5ML SOPN Inject weekly 6 mL 1   DULoxetine (CYMBALTA) 20 MG capsule Take 1 capsule (20 mg total) by mouth daily. 30 capsule 0   EPINEPHrine 0.3 mg/0.3 mL IJ SOAJ injection Inject 0.3 mg into the muscle once. Reported on 06/30/2015     furosemide (LASIX) 20 MG tablet Take 2 tablets (40 mg total) by mouth daily. 180 tablet 1   glucose blood (FREESTYLE LITE) test strip Use to monitor your blood sugars 4 times per day; E11.9 400 each 12   Insulin Pen Needle (SURE COMFORT PEN NEEDLES) 31G X 5 MM MISC USE 1 PEN NEEDLE PER DAY 100 each 3   Lancets (FREESTYLE) lancets Use to monitor blood sugars 4 times per day, once before each meal and once at bedtime 400 each 12   loratadine (CLARITIN) 10 MG tablet TAKE 1 TABLET DAILY 90 tablet 3   memantine (NAMENDA) 10 MG tablet Take 1 tablet (10 mg total) by mouth 2 (two) times daily. 180 tablet 3   montelukast (SINGULAIR) 10 MG tablet Take 10 mg by mouth at bedtime.     Multiple Vitamins-Minerals (CENTRUM SILVER PO) Take 1 tablet by mouth daily.     Omega-3 Fatty Acids (FISH OIL PO) Take 1 capsule by mouth daily in the afternoon.     potassium chloride (KLOR-CON M) 10 MEQ tablet Take 2 tablets (20 mEq total) by mouth daily. 180 tablet 1   QUEtiapine (SEROQUEL) 50 MG tablet Take 1 tablet (50 mg total) by mouth at bedtime. 15 tablet 0   Insulin Lispro Prot & Lispro (HUMALOG 75/25 MIX) (75-25) 100 UNIT/ML Kwikpen INJECT 30 UNITS UNDER THE SKIN DAILY WITH BREAKFAST and 10 UNITS WITH SUPPER. 30 mL 3   No current facility-administered medications for this visit.    PHYSICAL EXAM: Vitals:   09/30/22 1217  BP: 138/80  Pulse: 95  SpO2: 96%  Weight: 215 lb 9.6 oz (97.8 kg)  Height: 5\' 4"  (1.626 m)   Body mass index is 37.01 kg/m.  Wt Readings from Last 3 Encounters:  09/30/22 215 lb 9.6 oz (97.8 kg)  08/24/22 218 lb 11.1 oz (99.2 kg)  06/30/22 218 lb 9.6 oz  (99.2 kg)    General: Well developed, well nourished female in no apparent distress.  HEENT: AT/[place], no external lesions.  Eyes: Conjunctiva clear and no icterus. Neck: Neck supple  Lungs: Respirations not labored Neurologic: Alert, oriented, normal speech Extremities / Skin: Dry. No sores or rashes noted.  Psychiatric: Does not appear depressed or anxious  Diabetic Foot Exam - Simple   No data filed     LABS Reviewed Lab Results  Component Value Date   HGBA1C 7.5 (A) 09/30/2022   HGBA1C 6.6 (A) 06/30/2022   HGBA1C 6.4 04/06/2022   Lab Results  Component Value Date   FRUCTOSAMINE 299 (H) 06/26/2020   FRUCTOSAMINE 370 (H) 04/22/2015   Lab Results  Component Value Date   CHOL 157 09/07/2021   HDL 57.30 09/07/2021   LDLCALC 76 09/07/2021   TRIG 123.0 09/07/2021   CHOLHDL 3 09/07/2021   Lab Results  Component Value Date   MICRALBCREAT 0.7 04/09/2022   MICRALBCREAT 0.8 09/21/2021   Lab Results  Component Value Date   CREATININE 1.61 (H) 08/24/2022   Lab Results  Component Value Date   GFR 29.75 (L) 04/12/2022    ASSESSMENT / PLAN  1. Uncontrolled type 2 diabetes mellitus with hyperglycemia, with long-term current use of insulin (HCC)     Diabetes Mellitus  type 2, complicated by CKD - Diabetic status / severity: Fair control  Lab Results  Component Value Date   HGBA1C 7.5 (A) 09/30/2022    - Hemoglobin A1c goal : <7.5-8%  Patient has fair control of her diabetes mellitus, given her age and comorbidities.  She is having mostly fasting hyperglycemia.  Diabetes regimen as adjusted below.  - Medications:   I) Humalog mix 75/25: 30 units with breakfast and start Humalog mix 10 units with supper.  II) continue Trulicity 1.5 mg weekly.  Freestyle Libre 2 applied in the clinic by nurse today.  Patient has a phone at home.  Freestyle libre 3 sensor and reader will be prescribed to DME.  - Home glucose testing: Continue CGM or check blood sugar as  needed. - Discussed/ Gave Hypoglycemia treatment plan.  # Consult : not required at this time.   # Annual urine for microalbuminuria/ creatinine ratio, no microalbuminuria currently, following with nephrology.   Last  Lab Results  Component Value Date   MICRALBCREAT 0.7 04/09/2022    # Foot check nightly.  # Annual dilated diabetic eye exams.   - Diet: Make healthy diabetic food choices   2. Blood pressure  -  BP Readings from Last 1 Encounters:  09/30/22 138/80    - Control is in target.  - No change in current plans.  3. Lipid status / Hyperlipidemia - Last  Lab Results  Component Value Date   LDLCALC 76 09/07/2021   - Continue atorvastatin 10 mg daily.  Managed by primary care provider.  Diagnoses and all orders for this visit:  Uncontrolled type 2 diabetes mellitus with hyperglycemia, with long-term current use of insulin (HCC) -     POCT glycosylated hemoglobin (Hb A1C)  Other orders -     Insulin Lispro Prot & Lispro (HUMALOG 75/25 MIX) (75-25) 100 UNIT/ML Kwikpen; INJECT 30 UNITS UNDER THE SKIN DAILY WITH BREAKFAST and 10 UNITS WITH SUPPER.    DISPOSITION Follow up in clinic in 3 months suggested.   All questions answered and patient verbalized understanding of the plan.  [place] , MD [facility] Endocr[facility] [address] [place], [address] [place], [place] [address] Phone # [phone]  At least part of this note was generated using voice recognition software. Inadvertent word errors may have occurred, which were not recognized during the proofreading process.

## 2022-09-30 NOTE — Patient Instructions (Addendum)
Diabetes regimen:  Humalog mix 75/25: 30 units with breakfast and 10 units with supper.  Continue trulicity 1.5mg  weekly.  Will prescribe free style libre 3 sensor and reader.  For now use libre 2 on your phone.

## 2022-10-05 DIAGNOSIS — G301 Alzheimer's disease with late onset: Secondary | ICD-10-CM | POA: Diagnosis not present

## 2022-10-08 ENCOUNTER — Telehealth: Payer: Self-pay | Admitting: Internal Medicine

## 2022-10-08 DIAGNOSIS — F03C18 Unspecified dementia, severe, with other behavioral disturbance: Secondary | ICD-10-CM

## 2022-10-08 NOTE — Telephone Encounter (Signed)
Danielle from [employer] called & stated that they have adopted Angel Green into [employer] . She requested to receive a referral from Dr. Drue Novel for Home Health nursing for physical & occupational therapy for them to treat the pt as needed. She informed me that she will send me an email for what exactly they need Dr. Drue Novel to state in the referral document. Once I receive the email, I will provide the script.

## 2022-10-08 NOTE — Telephone Encounter (Signed)
Complete Home Health Referral     Order with what disciplines is ordered. Signed and dated by MD/NP/PA/DO  o OT and HHA cannot be ordered alone (must have SN/PT/ST to start care)   Signing physician is identified.   Demographics   Insurance verified and active and primary. [facility] auth received.   Most recent OVN. Within last 90 days.  Referral Fax # [phone]  I printed the referral document & placed in box.

## 2022-10-08 NOTE — Telephone Encounter (Signed)
Please advise 

## 2022-10-11 ENCOUNTER — Telehealth: Payer: Self-pay | Admitting: Internal Medicine

## 2022-10-11 NOTE — Telephone Encounter (Signed)
Referral placed.

## 2022-10-11 NOTE — Addendum Note (Signed)
Addended byConrad [place] D on: 10/11/2022 09:53 AM   Modules accepted: Orders

## 2022-10-11 NOTE — Telephone Encounter (Signed)
Apparently they just need my approval for a home health referral, please proceed

## 2022-10-11 NOTE — Telephone Encounter (Signed)
[facility] called to advise Dr. Drue Novel that they did receive the referral and they will be following pt for palliative care.

## 2022-10-13 NOTE — Telephone Encounter (Signed)
Can you resend referral please?  ?

## 2022-10-13 NOTE — Telephone Encounter (Signed)
Angel Green called stating that they had not received the form back with the verbal orders for pts care. Advised a message would be sent back to look into this and advise her of its status.

## 2022-10-14 ENCOUNTER — Telehealth: Payer: Self-pay | Admitting: Internal Medicine

## 2022-10-14 MED ORDER — DONEPEZIL HCL 10 MG PO TABS: 10.0000 mg | ORAL_TABLET | Freq: Every day | ORAL | 3 refills | Status: DC

## 2022-10-14 NOTE — Telephone Encounter (Signed)
Last OV 05/2022- won't be able to order home health orders w/o an OV. Please schedule visit at their convenience.

## 2022-10-14 NOTE — Telephone Encounter (Signed)
Rx sent 

## 2022-10-14 NOTE — Telephone Encounter (Signed)
[facility] rep Prince Rome (Branch Director of [employer])  came in office stating that pt is needing to have office notes and orders for pt to have PTNOT nursing ASAP. Pt is needing HOME HEALTH services. If any question please contact at [facility] [phone].

## 2022-10-14 NOTE — Telephone Encounter (Signed)
Medication: donepezil (ARICEPT) 10 MG tablet  Has the patient contacted their pharmacy? Yes.     Preferred Pharmacy:  [facility]

## 2022-10-15 NOTE — Telephone Encounter (Signed)
LVM for pt to call the office and schedule OV with provider to get order for [facility] ASAP, per PCP.

## 2022-10-19 ENCOUNTER — Ambulatory Visit (INDEPENDENT_AMBULATORY_CARE_PROVIDER_SITE_OTHER): Payer: Medicare Other | Admitting: Adult Health

## 2022-10-19 ENCOUNTER — Encounter: Payer: Self-pay | Admitting: Adult Health

## 2022-10-19 VITALS — BP 151/80 | HR 89 | Ht 65.0 in | Wt 217.0 lb

## 2022-10-19 DIAGNOSIS — F02C0 Dementia in other diseases classified elsewhere, severe, without behavioral disturbance, psychotic disturbance, mood disturbance, and anxiety: Secondary | ICD-10-CM

## 2022-10-19 DIAGNOSIS — G301 Alzheimer's disease with late onset: Secondary | ICD-10-CM | POA: Diagnosis not present

## 2022-10-19 MED ORDER — QUETIAPINE FUMARATE 50 MG PO TABS: 50.0000 mg | ORAL_TABLET | Freq: Every day | ORAL | 3 refills | Status: DC

## 2022-10-19 MED ORDER — MEMANTINE HCL 10 MG PO TABS: 10.0000 mg | ORAL_TABLET | Freq: Two times a day (BID) | ORAL | 3 refills | Status: DC

## 2022-10-19 NOTE — Patient Instructions (Addendum)
Your Plan:  Continue Aricept and Namenda for cognition  Continue Seroquel at night to help with sleep     Follow up in 1 year or call earlier if needed     Thank you for coming to see [place] at [facility]. I hope we have been able to provide you high quality care today.  You may receive a patient satisfaction survey over the next few weeks. We would appreciate your feedback and comments so that we may continue to improve ourselves and the health of our patients.

## 2022-10-19 NOTE — Progress Notes (Signed)
[facility] [address] [place]. [place] [address] [phone]       OFFICE FOLLOW UP NOTE  [name] Date of Birth:  1940/10/14 Medical Record Number:  [id number]    Primary neurologist: Dr. Terrace Arabia Reason for visit: Dementia    SUBJECTIVE:   CHIEF COMPLAINT:  Chief Complaint  Patient presents with   Follow-up    Rm 3, here with husband Kevan Ny and care giver Dawn  Pt is here following up on dementia. Pt's husband states pt's memory has been stable. No new concerns.    Follow-up visit:  Prior visit: 10/21/2021 with Dr. Terrace Arabia  Brief HPI:   Angel Green is a 82 year old female with underlying medical history of diabetes, chronic renal insufficiency, HTN and dementia.  She was initially evaluated by Dr. Terrace Arabia on 10/18/2017 for dementia with behavioral disturbance starting around 2018.  MRI brain 07/2021 significant brain atrophy.  Lab work no treatable etiology.  Felt to be consistent with Alzheimer's dementia.  At prior visit, unable to complete MMSE. Prior MMSE 10/30 (05/2020).  Commended continuation of Aricept and Namenda   Referral history:  Accompanied by husband who provides history and caregiver Dawn. Reports memory has been stable since prior visit. Denies agitation or aggression.  Can have visual hallucinations at time, is not bothersome.  Sleeps well at night.  Appetite can fluctuate but no weight loss concerns.  Remains on Aricept, Namenda and Seroquel without side effects.  She now has a caregiver Monday through Friday for 6 hours/day over the past 2 months, assists patient with ADLs.  Routinely follows with PCP.  No questions or concerns at this time.    ROS:   N/A d/t dementia   PMH:  Past Medical History:  Diagnosis Date   Allergy    Dust Mites   Ankle pain    chronic   Diabetes mellitus, type 2 (HCC)    Hyperlipidemia    Hypertension    Memory loss    Osteoarthritis    RENAL INSUFFICIENCY, CHRONIC 04/16/2010    Rhinitis    allergic nos    PSH:  Past Surgical History:  Procedure Laterality Date   CATARACT EXTRACTION     CHOLECYSTECTOMY     dental implants     PARTIAL HYSTERECTOMY     in her 30s    Social History:  Social History   Socioeconomic History   Marital status: Married    Spouse name: Not on file   Number of children: 2   Years of education: some college   Highest education level: Not on file  Occupational History   Occupation:  retired  Tobacco Use   Smoking status: Never   Smokeless tobacco: Never  Vaping Use   Vaping status: Never Used  Substance and Sexual Activity   Alcohol use: No    Alcohol/week: 0.0 standard drinks of alcohol   Drug use: No   Sexual activity: Yes    Partners: Male  Other Topics Concern   Not on file  Social History Narrative   Husband is a Optician, dispensing.   Right-handed.   1 cup caffeine daily.   Lives at home with husband.          Social Determinants of Health   Financial Resource Strain: Low Risk  (08/17/2021)   Overall Financial Resource Strain (CARDIA)    Difficulty of Paying Living Expenses: Not very hard  Food Insecurity: No Food Insecurity (02/11/2022)   Hunger Vital Sign    Worried  About Running Out of Food in the Last Year: Never true    Ran Out of Food in the Last Year: Never true  Transportation Needs: No Transportation Needs (02/11/2022)   PRAPARE - Administrator, Civil Service (Medical): No    Lack of Transportation (Non-Medical): No  Physical Activity: Inactive (05/18/2022)   Exercise Vital Sign    Days of Exercise per Week: 0 days    Minutes of Exercise per Session: 0 min  Stress: Not on file  Social Connections: Not on file  Intimate Partner Violence: Not At Risk (02/09/2022)   Humiliation, Afraid, Rape, and Kick questionnaire    Fear of Current or Ex-Partner: No    Emotionally Abused: No    Physically Abused: No    Sexually Abused: No    Family History:  Family History  Problem Relation Age of  Onset   Healthy Brother    Healthy Son    Healthy Son    Other Mother        unsure of history - "old age"   Other Father        unsure of history - "old age"   Heart attack Neg Hx    Colon cancer Neg Hx    Breast cancer Neg Hx    Stroke Neg Hx     Medications:   Current Outpatient Medications on File Prior to Visit  Medication Sig Dispense Refill   amLODipine (NORVASC) 5 MG tablet Take 1 tablet (5 mg total) by mouth daily. 90 tablet 1   aspirin 81 MG chewable tablet Chew 81 mg by mouth daily.     atorvastatin (LIPITOR) 10 MG tablet Take 1 tablet (10 mg total) by mouth daily. 90 tablet 1   azelastine (ASTELIN) 0.1 % nasal spray Place into both nostrils 2 (two) times daily. Use in each nostril as directed     Blood Glucose Monitoring Suppl (FREESTYLE LITE) DEVI Use to monitor your blood sugars 1 each 0   Continuous Blood Gluc Sensor (FREESTYLE LIBRE 2 SENSOR) MISC 1 Device by Does not apply route every 14 (fourteen) days. 6 each 3   Cyanocobalamin (B-12 PO) Take 1 tablet by mouth daily. Takes 5000 mcg by mouth daily.     donepezil (ARICEPT) 10 MG tablet Take 1 tablet (10 mg total) by mouth at bedtime. 90 tablet 3   Dulaglutide (TRULICITY) 1.5 MG/0.5ML SOPN Inject weekly 6 mL 1   DULoxetine (CYMBALTA) 20 MG capsule Take 1 capsule (20 mg total) by mouth daily. 30 capsule 0   EPINEPHrine 0.3 mg/0.3 mL IJ SOAJ injection Inject 0.3 mg into the muscle once. Reported on 06/30/2015     furosemide (LASIX) 20 MG tablet Take 2 tablets (40 mg total) by mouth daily. 180 tablet 1   glucose blood (FREESTYLE LITE) test strip Use to monitor your blood sugars 4 times per day; E11.9 400 each 12   Insulin Lispro Prot & Lispro (HUMALOG 75/25 MIX) (75-25) 100 UNIT/ML Kwikpen INJECT 30 UNITS UNDER THE SKIN DAILY WITH BREAKFAST and 10 UNITS WITH SUPPER. 30 mL 3   Insulin Pen Needle (SURE COMFORT PEN NEEDLES) 31G X 5 MM MISC USE 1 PEN NEEDLE PER DAY 100 each 3   Lancets (FREESTYLE) lancets Use to monitor blood  sugars 4 times per day, once before each meal and once at bedtime 400 each 12   loratadine (CLARITIN) 10 MG tablet TAKE 1 TABLET DAILY 90 tablet 3   montelukast (SINGULAIR) 10 MG tablet Take  10 mg by mouth at bedtime.     Multiple Vitamins-Minerals (CENTRUM SILVER PO) Take 1 tablet by mouth daily.     Omega-3 Fatty Acids (FISH OIL PO) Take 1 capsule by mouth daily in the afternoon.     potassium chloride (KLOR-CON M) 10 MEQ tablet Take 2 tablets (20 mEq total) by mouth daily. 180 tablet 1   No current facility-administered medications on file prior to visit.    Allergies:   Allergies  Allergen Reactions   Dust Mite Mixed Allergen Ext [Mite (D. Farinae)]       OBJECTIVE:  Physical Exam  Vitals:   10/19/22 1038  BP: (!) 151/80  Pulse: 89  Weight: 217 lb (98.4 kg)  Height: 5\' 5"  (1.651 m)   Body mass index is 36.11 kg/m. No results found.  General: well developed, well nourished, very pleasant elderly African-American female, seated, in no evident distress Head: head normocephalic and atraumatic.   Neck: supple with no carotid or supraclavicular bruits Cardiovascular: regular rate and rhythm, no murmurs Musculoskeletal: no deformity Skin:  no rash/petichiae Vascular:  Normal pulses all extremities   Neurologic Exam Mental Status: Awake and fully alert.  Disoriented to place and time, unable to provide last name or DOB.  Husband provides majority of today's history.  Difficulty following some commands.  Mood and affect appropriate.      10/19/2022   10:54 AM 05/18/2022    3:16 PM 06/17/2020   10:45 AM 01/31/2020    9:51 AM 08/09/2019    2:53 PM 11/14/2018    9:31 AM 04/20/2018    1:37 PM  MMSE - Mini Mental State Exam  Not completed: Unable to complete Unable to complete    Unable to complete   Orientation to time   0 0 1  3  Orientation to Place   0 0 3  4  Registration   3 3 3  3   Attention/ Calculation   0 0 0  2  Recall   0 0 0  0  Language- name 2 objects   2 2 2   2   Language- repeat   1 1 1  1   Language- follow 3 step command   3 0 3  3  Language- read & follow direction   1 0 1  1  Write a sentence   0 0 1  1  Copy design   0 0 0  1  Copy design-comments     4 animals    Total score   10 6 15  21    Cranial Nerves: Pupils equal, briskly reactive to light. Extraocular movements full without nystagmus. Visual fields full to confrontation. Hearing intact. Facial sensation intact. Face, tongue, palate moves normally and symmetrically.  Motor: Normal bulk and tone. Normal strength in all tested extremity muscles. Sensory.: intact to touch , pinprick , position and vibratory sensation.  Coordination: Rapid alternating movements normal in all extremities. Finger-to-nose and heel-to-shin performed accurately bilaterally. Gait and Station: Arises from chair without difficulty. Stance is normal. Gait demonstrates normal stride length and balance without use of assistive device Reflexes: 1+ and symmetric. Toes downgoing.        ASSESSMENT/PLAN: Angel Green is a 81 y.o. year old female with underlying medical history of DM, HTN, chronic renal dysfunction and dementia.  Onset of cognitive decline in 2018 likely in setting of Alzheimer's dementia   -Cognition has been stable - unable to complete MMSE -Continue Aricept and Namenda for cognition -Continue  Seroquel 50 mg nightly to help with sleep and behaviors -Continue to stay active as tolerated, routine socialization and ensuring good sleep.  Discussed importance of adequate fluid intake and healthy diet, advised husband to further discuss with PCP if appetite declines    Follow-up in 1 year or call earlier if needed (husband requests yearly f/u visits)     CC:  Wanda Plump, MD     I spent 30 minutes of face-to-face and non-face-to-face time with patient and husband and caregiver.  This included previsit chart review, lab review, study review, electronic health record documentation, patient  and husband discussion and education regarding dementia and ongoing use of medications and answered all other questions to patient and husband satisfaction  Ihor Austin, AGNP-BC  [facility] [address] [place], [place] [address]  Phone [phone] Fax [phone] Note: This document was prepared with digital dictation and possible smart phrase technology. Any transcriptional errors that result from this process are unintentional.

## 2022-10-24 DIAGNOSIS — G301 Alzheimer's disease with late onset: Secondary | ICD-10-CM | POA: Diagnosis not present

## 2022-10-29 DIAGNOSIS — Z23 Encounter for immunization: Secondary | ICD-10-CM | POA: Diagnosis not present

## 2022-10-31 ENCOUNTER — Encounter (HOSPITAL_COMMUNITY): Payer: Self-pay | Admitting: *Deleted

## 2022-10-31 ENCOUNTER — Emergency Department (HOSPITAL_COMMUNITY)
Admission: EM | Admit: 2022-10-31 | Discharge: 2022-10-31 | Disposition: A | Discharge status: Home / Self Care | Payer: Medicare Other | Attending: Emergency Medicine | Admitting: Emergency Medicine

## 2022-10-31 ENCOUNTER — Emergency Department (HOSPITAL_COMMUNITY): Payer: Medicare Other

## 2022-10-31 ENCOUNTER — Other Ambulatory Visit: Payer: Self-pay

## 2022-10-31 DIAGNOSIS — R531 Weakness: Secondary | ICD-10-CM | POA: Diagnosis not present

## 2022-10-31 DIAGNOSIS — Z794 Long term (current) use of insulin: Secondary | ICD-10-CM | POA: Insufficient documentation

## 2022-10-31 DIAGNOSIS — N261 Atrophy of kidney (terminal): Secondary | ICD-10-CM | POA: Insufficient documentation

## 2022-10-31 DIAGNOSIS — I700 Atherosclerosis of aorta: Secondary | ICD-10-CM | POA: Diagnosis not present

## 2022-10-31 DIAGNOSIS — I10 Essential (primary) hypertension: Secondary | ICD-10-CM | POA: Diagnosis not present

## 2022-10-31 DIAGNOSIS — R102 Pelvic and perineal pain: Secondary | ICD-10-CM | POA: Diagnosis not present

## 2022-10-31 DIAGNOSIS — Z79899 Other long term (current) drug therapy: Secondary | ICD-10-CM | POA: Insufficient documentation

## 2022-10-31 DIAGNOSIS — Z7982 Long term (current) use of aspirin: Secondary | ICD-10-CM | POA: Diagnosis not present

## 2022-10-31 DIAGNOSIS — R1030 Lower abdominal pain, unspecified: Secondary | ICD-10-CM | POA: Insufficient documentation

## 2022-10-31 DIAGNOSIS — F0390 Unspecified dementia without behavioral disturbance: Secondary | ICD-10-CM | POA: Diagnosis not present

## 2022-10-31 DIAGNOSIS — R935 Abnormal findings on diagnostic imaging of other abdominal regions, including retroperitoneum: Secondary | ICD-10-CM | POA: Diagnosis not present

## 2022-10-31 DIAGNOSIS — R109 Unspecified abdominal pain: Secondary | ICD-10-CM

## 2022-10-31 DIAGNOSIS — N281 Cyst of kidney, acquired: Secondary | ICD-10-CM | POA: Diagnosis not present

## 2022-10-31 DIAGNOSIS — E1165 Type 2 diabetes mellitus with hyperglycemia: Secondary | ICD-10-CM | POA: Diagnosis not present

## 2022-10-31 DIAGNOSIS — Z7984 Long term (current) use of oral hypoglycemic drugs: Secondary | ICD-10-CM | POA: Diagnosis not present

## 2022-10-31 DIAGNOSIS — K56609 Unspecified intestinal obstruction, unspecified as to partial versus complete obstruction: Secondary | ICD-10-CM | POA: Diagnosis not present

## 2022-10-31 LAB — CBC WITH DIFFERENTIAL/PLATELET
Abs Immature Granulocytes: 0.01 10*3/uL (ref 0.00–0.07)
Basophils Absolute: 0 10*3/uL (ref 0.0–0.1)
Basophils Relative: 0 %
Eosinophils Absolute: 0.2 10*3/uL (ref 0.0–0.5)
Eosinophils Relative: 2 %
HCT: 42.7 % (ref 36.0–46.0)
Hemoglobin: 13.8 g/dL (ref 12.0–15.0)
Immature Granulocytes: 0 %
Lymphocytes Relative: 34 %
Lymphs Abs: 2.5 10*3/uL (ref 0.7–4.0)
MCH: 31.2 pg (ref 26.0–34.0)
MCHC: 32.3 g/dL (ref 30.0–36.0)
MCV: 96.4 fL (ref 80.0–100.0)
Monocytes Absolute: 0.5 10*3/uL (ref 0.1–1.0)
Monocytes Relative: 7 %
Neutro Abs: 4.2 10*3/uL (ref 1.7–7.7)
Neutrophils Relative %: 57 %
Platelets: 221 10*3/uL (ref 150–400)
RBC: 4.43 MIL/uL (ref 3.87–5.11)
RDW: 14.1 % (ref 11.5–15.5)
WBC: 7.4 10*3/uL (ref 4.0–10.5)
nRBC: 0 % (ref 0.0–0.2)

## 2022-10-31 LAB — LIPASE, BLOOD: Lipase: 47 U/L (ref 11–51)

## 2022-10-31 LAB — COMPREHENSIVE METABOLIC PANEL
ALT: 20 U/L (ref 0–44)
AST: 26 U/L (ref 15–41)
Albumin: 3.4 g/dL — ABNORMAL LOW (ref 3.5–5.0)
Alkaline Phosphatase: 79 U/L (ref 38–126)
Anion gap: 15 (ref 5–15)
BUN: 14 mg/dL (ref 8–23)
CO2: 20 mmol/L — ABNORMAL LOW (ref 22–32)
Calcium: 9.3 mg/dL (ref 8.9–10.3)
Chloride: 101 mmol/L (ref 98–111)
Creatinine, Ser: 1.42 mg/dL — ABNORMAL HIGH (ref 0.44–1.00)
GFR, Estimated: 36 mL/min — ABNORMAL LOW (ref >60)
Glucose, Bld: 236 mg/dL — ABNORMAL HIGH (ref 70–99)
Potassium: 4.3 mmol/L (ref 3.5–5.1)
Sodium: 136 mmol/L (ref 135–145)
Total Bilirubin: 0.4 mg/dL (ref 0.3–1.2)
Total Protein: 7.6 g/dL (ref 6.5–8.1)

## 2022-10-31 LAB — CK: Total CK: 96 U/L (ref 38–234)

## 2022-10-31 LAB — CBG MONITORING, ED: Glucose-Capillary: 216 mg/dL — ABNORMAL HIGH (ref 70–99)

## 2022-10-31 MED ORDER — IOHEXOL 350 MG/ML SOLN
75.0000 mL | Freq: Once | INTRAVENOUS | Status: AC | PRN
  Administered 2022-10-31: 75 mL via INTRAVENOUS

## 2022-10-31 NOTE — ED Provider Triage Note (Signed)
Emergency Medicine Provider Triage Evaluation Note  Angel Green , a 82 y.o. female  was evaluated in triage.  Pt complains of lower abdominal pain and lower extremity weakness since yesterday.  Per family at bedside patient mental status is at baseline. No fever, nausea, vomiting.  Review of Systems  Positive: As above Negative: As above  Physical Exam  BP 120/77 (BP Location: Left Arm)   Pulse 88   Temp 98.5 F (36.9 C) (Oral)   Resp 16   Ht 5\' 5"  (1.651 m)   Wt 98.4 kg   SpO2 100%   BMI 36.10 kg/m  Gen:   Awake, no distress   Resp:  Normal effort  MSK:   Moves extremities without difficulty  Other:    Medical Decision Making  Medically screening exam initiated at 11:59 AM.  Appropriate orders placed.  Angel Green was informed that the remainder of the evaluation will be completed by another provider, this initial triage assessment does not replace that evaluation, and the importance of remaining in the ED until their evaluation is complete.    Jeanelle Malling, [place] 10/31/22 1201

## 2022-10-31 NOTE — ED Provider Notes (Signed)
EMERGENCY DEPARTMENT AT [facility] Provider Note   CSN: [id number] Arrival date & time: 10/31/22  1056     History  Chief Complaint  Patient presents with   Weakness    Angel Green is a 82 y.o. female with a history of diabetes, dementia, presenting from home with complaint of acute pain.  The patient is level 5 caveat due to dementia.  History is provided by her husband.  He reports that the patient's been having episodes of severe pain that typically happen late at night when they are in bed, approximately once every other night, going back for several months.  He says often he will just help reposition her and the pain seems to go away.  However she had a much more severe episode this morning which prompted him to call 911 or bring her into the ED.  Currently she is asymptomatic.  The patient denies nausea or vomiting.  Her husband reports that she eats normally, does not have postprandial pain, and has frequent loose bowel movements.  She does not have consistent pain, specifically with moving, or radiculopathy that she has complained about to her husband.  They are not certain what the etiology of her symptoms may be.  Medical record review shows the patient was hospitalized most recently in December, at that time for sepsis pneumonia and dehydration and AKI, found to have an ileus versus early bowel obstruction on her CT imaging.  HPI     Home Medications Prior to Admission medications   Medication Sig Start Date End Date Taking? Authorizing Provider  amLODipine (NORVASC) 5 MG tablet Take 1 tablet (5 mg total) by mouth daily. 01/26/22   Olive Bass, FNP  aspirin 81 MG chewable tablet Chew 81 mg by mouth daily.    [provider]  atorvastatin (LIPITOR) 10 MG tablet Take 1 tablet (10 mg total) by mouth daily. 04/02/22   Wanda Plump, MD  azelastine (ASTELIN) 0.1 % nasal spray Place into both nostrils 2 (two) times daily. Use in each nostril  as directed    [provider]  Blood Glucose Monitoring Suppl (FREESTYLE LITE) DEVI Use to monitor your blood sugars 12/26/17   Romero Belling, MD  Continuous Blood Gluc Sensor (FREESTYLE LIBRE 2 SENSOR) MISC 1 Device by Does not apply route every 14 (fourteen) days. 03/11/22   Reather Littler, MD  Cyanocobalamin (B-12 PO) Take 1 tablet by mouth daily. Takes 5000 mcg by mouth daily.    [provider]  donepezil (ARICEPT) 10 MG tablet Take 1 tablet (10 mg total) by mouth at bedtime. 10/14/22   Wanda Plump, MD  Dulaglutide (TRULICITY) 1.5 MG/0.5ML SOPN Inject weekly 08/03/22   Reather Littler, MD  DULoxetine (CYMBALTA) 20 MG capsule Take 1 capsule (20 mg total) by mouth daily. 05/04/22   Wanda Plump, MD  EPINEPHrine 0.3 mg/0.3 mL IJ SOAJ injection Inject 0.3 mg into the muscle once. Reported on 06/30/2015    [provider]  furosemide (LASIX) 20 MG tablet Take 2 tablets (40 mg total) by mouth daily. 07/20/22   Wanda Plump, MD  glucose blood (FREESTYLE LITE) test strip Use to monitor your blood sugars 4 times per day; E11.9 05/07/22   Reather Littler, MD  Insulin Lispro Prot & Lispro (HUMALOG 75/25 MIX) (75-25) 100 UNIT/ML Kwikpen INJECT 30 UNITS UNDER THE SKIN DAILY WITH BREAKFAST and 10 UNITS WITH SUPPER. 09/30/22   Thapa, [place], MD  Insulin Pen Needle (SURE COMFORT  PEN NEEDLES) 31G X 5 MM MISC USE 1 PEN NEEDLE PER DAY 08/31/22   Reather Littler, MD  Lancets (FREESTYLE) lancets Use to monitor blood sugars 4 times per day, once before each meal and once at bedtime 12/26/17   Romero Belling, MD  loratadine (CLARITIN) 10 MG tablet TAKE 1 TABLET DAILY 03/22/22   Wanda Plump, MD  memantine (NAMENDA) 10 MG tablet Take 1 tablet (10 mg total) by mouth 2 (two) times daily. 10/19/22   Ihor Austin, NP  montelukast (SINGULAIR) 10 MG tablet Take 10 mg by mouth at bedtime.    [provider]  Multiple Vitamins-Minerals (CENTRUM SILVER PO) Take 1 tablet by mouth daily.    [provider]  Omega-3  Fatty Acids (FISH OIL PO) Take 1 capsule by mouth daily in the afternoon.    [provider]  potassium chloride (KLOR-CON M) 10 MEQ tablet Take 2 tablets (20 mEq total) by mouth daily. 07/20/22   Wanda Plump, MD  QUEtiapine (SEROQUEL) 50 MG tablet Take 1 tablet (50 mg total) by mouth at bedtime. 10/19/22   Ihor Austin, NP      Allergies    Dust mite mixed allergen ext [mite (d. farinae)]    Review of Systems   Review of Systems  Physical Exam Updated Vital Signs BP 120/77 (BP Location: Left Arm)   Pulse 88   Temp 98.5 F (36.9 C) (Oral)   Resp 16   Ht 5\' 5"  (1.651 m)   Wt 98.4 kg   SpO2 100%   BMI 36.10 kg/m  Physical Exam Constitutional:      General: She is not in acute distress.    Appearance: She is obese.  HENT:     Head: Normocephalic and atraumatic.  Eyes:     Conjunctiva/sclera: Conjunctivae normal.     Pupils: Pupils are equal, round, and reactive to light.  Cardiovascular:     Rate and Rhythm: Normal rate and regular rhythm.  Pulmonary:     Effort: Pulmonary effort is normal. No respiratory distress.  Abdominal:     General: There is no distension.     Tenderness: There is no abdominal tenderness. There is no guarding or rebound.  Skin:    General: Skin is warm and dry.  Neurological:     General: No focal deficit present.     Mental Status: She is alert. Mental status is at baseline.  Psychiatric:        Mood and Affect: Mood normal.        Behavior: Behavior normal.     ED Results / Procedures / Treatments   Labs (all labs ordered are listed, but only abnormal results are displayed) Labs Reviewed  CBG MONITORING, ED - Abnormal; Notable for the following components:      Result Value   Glucose-Capillary 216 (*)    All other components within normal limits  URINALYSIS, ROUTINE W REFLEX MICROSCOPIC  CBC WITH DIFFERENTIAL/PLATELET  COMPREHENSIVE METABOLIC PANEL  LIPASE, BLOOD  CK    EKG EKG Interpretation Date/Time:  Sunday  October 31 2022 11:32:44 EDT Ventricular Rate:  83 PR Interval:  132 QRS Duration:  90 QT Interval:  376 QTC Calculation: 441 R Axis:   -15  Text Interpretation: Normal sinus rhythm Moderate voltage criteria for LVH, may be normal variant ( R in aVL , Cornell product ) When compared with ECG of 24-Aug-2022 13:55, PREVIOUS ECG IS PRESENT No significant changes Confirmed by Alvester Chou [phone]) on 10/31/2022  25:85:27 PM  Radiology No results found.  Procedures Procedures    Medications Ordered in ED Medications - No data to display  ED Course/ Medical Decision Making/ A&P                                 Medical Decision Making Amount and/or Complexity of Data Reviewed Labs: ordered. Radiology: ordered.   This patient presents to the ED with concern for acute episodic abdominal versus back pain. This involves an extensive number of treatment options, and is a complaint that carries with it a high risk of complications and morbidity.  The differential diagnosis includes biliary colic versus functional bowel syndrome versus constipation versus ileus versus other  Co-morbidities that complicate the patient evaluation: History of potential partial bowel obstruction in December, at high risk of recurrence  Additional history obtained from patient's husband at the bedside.  Patient is at her baseline mental status according to her husband.  External records from outside source obtained and reviewed including December hospitalization course and discharge summary  I ordered and personally interpreted labs.  The pertinent results include:  mild hyperglycemia, glucose 236 lipase and lft's wnl, ck wnl; cbc pending.  I ordered imaging studies including CT abdomen pelvis, pending at signout  The patient was maintained on a cardiac monitor.  I personally viewed and interpreted the cardiac monitored which showed an underlying rhythm of: Sinus rhythm  Per my interpretation the patient's  ECG shows no acute ischemic findings   I have reviewed the patients home medicines and have made adjustments as needed  After the interventions noted above, I reevaluated the patient and found that they have: stayed the same  Dispostion:  Patient signed out to Dr Vivi Barrack EDP pending follow up on CT imaging        Final Clinical Impression(s) / ED Diagnoses Final diagnoses:  None    Rx / DC Orders ED Discharge Orders     None         Terald Sleeper, MD 10/31/22 1528

## 2022-10-31 NOTE — ED Triage Notes (Signed)
Patient is brought into the ed via PTAR from home, denies any complaints states she doesn't know why she is here only oriented to first name.

## 2022-10-31 NOTE — ED Provider Notes (Signed)
3:07 PM Assumed care of patient from off-going team. For more details, please see note from same day.   In brief, this is a 82 y.o. female with dementia with months of episode of sudden/severe abdominal pain. Husband will pick her up, readjust her, and the pain then seems to go away. Had episode this morning that was so severe he called 911. Now she is asymptomatic. Not reproducible in the ED. Last year she had CT that showed possible ileus/obstruction.    Plan/Dispo at time of sign-out & ED Course since sign-out: [ ]  CT abd pelvis   BP (!) 141/72   Pulse 68   Temp 98.5 F (36.9 C) (Oral)   Resp 14   Ht 5\' 5"  (1.651 m)   Wt 98.4 kg   SpO2 98%   BMI 36.10 kg/m    ED Course:   Clinical Course as of 11/03/22 1820  Wynelle Link Oct 31, 2022  2023 Patient still awaiting CT scan [HN]    Clinical Course User Index [HN] Loetta Rough, MD   Patient with CT scan that shows possible biliary obstruction with instructions to correlate with CMP.  Her CMP does not demonstrate any transaminitis or evidence of biliary obstruction.. She hasn't had any recurrent episodes of abdominal pain in the ED. her CBC was clotted and needed to be redrawn which is within normal limits.  On reevaluation patient and her husband are ready to be discharged.  Instructed to follow-up with PCP within 1 week.  Given discharge instructions and return precautions, all questions answered to their satisfaction.  ------------------------------- Vivi Barrack, MD Emergency Medicine  This note was created using dictation software, which may contain spelling or grammatical errors.   Loetta Rough, MD 11/03/22 [phone]

## 2022-10-31 NOTE — Discharge Instructions (Signed)
Thank you for coming to [facility] Emergency [facility]. You were seen for intermittent abdominal pain. We did an exam, labs, and imaging, and these showed no acute findings.  Please follow up with your primary care provider within 1 week.   Do not hesitate to return to the ED or call 911 if you experience: -Worsening symptoms -Chest pain, shortness of breath -Nausea vomiting or inability to eat/drink -Lightheadedness, passing out -Fevers/chills -Anything else that concerns you

## 2022-10-31 NOTE — ED Notes (Signed)
Patient reports no additional pain at this time.  Provided with sandwich and fruit per request

## 2022-10-31 NOTE — ED Notes (Signed)
CT called and made aware that patient has an IV

## 2022-10-31 NOTE — ED Notes (Signed)
Renaye Rakers, MD notified of difficult IV stick.

## 2022-10-31 NOTE — ED Notes (Addendum)
Husband has arrived and states patient is at her baseline for mental status , he states she is having abd. Pain and pain when he tries to move her , patient denies.

## 2022-11-01 ENCOUNTER — Telehealth: Payer: Self-pay | Admitting: Internal Medicine

## 2022-11-01 ENCOUNTER — Other Ambulatory Visit: Payer: Self-pay

## 2022-11-01 ENCOUNTER — Emergency Department (HOSPITAL_COMMUNITY)
Admission: EM | Admit: 2022-11-01 | Discharge: 2022-11-01 | Disposition: A | Discharge status: Home / Self Care | Payer: Medicare Other | Attending: Emergency Medicine | Admitting: Emergency Medicine

## 2022-11-01 DIAGNOSIS — F0390 Unspecified dementia without behavioral disturbance: Secondary | ICD-10-CM | POA: Insufficient documentation

## 2022-11-01 DIAGNOSIS — Z7982 Long term (current) use of aspirin: Secondary | ICD-10-CM | POA: Diagnosis not present

## 2022-11-01 DIAGNOSIS — M25551 Pain in right hip: Secondary | ICD-10-CM | POA: Insufficient documentation

## 2022-11-01 DIAGNOSIS — R531 Weakness: Secondary | ICD-10-CM | POA: Diagnosis not present

## 2022-11-01 DIAGNOSIS — M25552 Pain in left hip: Secondary | ICD-10-CM | POA: Diagnosis not present

## 2022-11-01 DIAGNOSIS — Z794 Long term (current) use of insulin: Secondary | ICD-10-CM | POA: Insufficient documentation

## 2022-11-01 NOTE — Discharge Instructions (Addendum)
Call Dr. Drue Novel to try to get an outpatient urinalysis to rule out UTI.  If she develops any other new or worsening symptoms or you have any other new concerns then you can return to the ER or call 911.

## 2022-11-01 NOTE — ED Provider Notes (Signed)
[place] EMERGENCY DEPARTMENT AT [facility] Provider Note   CSN: [id number] Arrival date & time: 11/01/22  1454     History  Chief Complaint  Patient presents with   Hip Pain    Angel Green is a 82 y.o. female.  HPI 82 year old female presents with leg pain and abdominal pain. History is given by the husband, as the patient has dementia. She's complained of abdominal pain and bilateral thigh/hip pain for the past 2 weeks or so. On and off poor appetite. No vomiting. Husband questions if she might have a UTI. Went to [facility] yesterday where she had labs and a CT of the abdomen, all of which was unremarkable. This morning she didn't eat much. Husband was talking to a friend who suggested coming in to the ER for a 2nd opinion.  Home Medications Prior to Admission medications   Medication Sig Start Date End Date Taking? Authorizing Provider  amLODipine (NORVASC) 5 MG tablet Take 1 tablet (5 mg total) by mouth daily. 01/26/22   Olive Bass, FNP  aspirin 81 MG chewable tablet Chew 81 mg by mouth daily.    [provider]  atorvastatin (LIPITOR) 10 MG tablet Take 1 tablet (10 mg total) by mouth daily. 04/02/22   Wanda Plump, MD  azelastine (ASTELIN) 0.1 % nasal spray Place into both nostrils 2 (two) times daily. Use in each nostril as directed    [provider]  Blood Glucose Monitoring Suppl (FREESTYLE LITE) DEVI Use to monitor your blood sugars 12/26/17   Romero Belling, MD  Continuous Blood Gluc Sensor (FREESTYLE LIBRE 2 SENSOR) MISC 1 Device by Does not apply route every 14 (fourteen) days. 03/11/22   Reather Littler, MD  Cyanocobalamin (B-12 PO) Take 1 tablet by mouth daily. Takes 5000 mcg by mouth daily.    [provider]  donepezil (ARICEPT) 10 MG tablet Take 1 tablet (10 mg total) by mouth at bedtime. 10/14/22   Wanda Plump, MD  Dulaglutide (TRULICITY) 1.5 MG/0.5ML SOPN Inject weekly 08/03/22   Reather Littler, MD  DULoxetine (CYMBALTA) 20  MG capsule Take 1 capsule (20 mg total) by mouth daily. 05/04/22   Wanda Plump, MD  EPINEPHrine 0.3 mg/0.3 mL IJ SOAJ injection Inject 0.3 mg into the muscle once. Reported on 06/30/2015    [provider]  furosemide (LASIX) 20 MG tablet Take 2 tablets (40 mg total) by mouth daily. 07/20/22   Wanda Plump, MD  glucose blood (FREESTYLE LITE) test strip Use to monitor your blood sugars 4 times per day; E11.9 05/07/22   Reather Littler, MD  Insulin Lispro Prot & Lispro (HUMALOG 75/25 MIX) (75-25) 100 UNIT/ML Kwikpen INJECT 30 UNITS UNDER THE SKIN DAILY WITH BREAKFAST and 10 UNITS WITH SUPPER. 09/30/22   Thapa, [place], MD  Insulin Pen Needle (SURE COMFORT PEN NEEDLES) 31G X 5 MM MISC USE 1 PEN NEEDLE PER DAY 08/31/22   Reather Littler, MD  Lancets (FREESTYLE) lancets Use to monitor blood sugars 4 times per day, once before each meal and once at bedtime 12/26/17   Romero Belling, MD  loratadine (CLARITIN) 10 MG tablet TAKE 1 TABLET DAILY 03/22/22   Wanda Plump, MD  memantine (NAMENDA) 10 MG tablet Take 1 tablet (10 mg total) by mouth 2 (two) times daily. 10/19/22   Ihor Austin, NP  montelukast (SINGULAIR) 10 MG tablet Take 10 mg by mouth at bedtime.    [provider]  Multiple Vitamins-Minerals (CENTRUM  SILVER PO) Take 1 tablet by mouth daily.    [provider]  Omega-3 Fatty Acids (FISH OIL PO) Take 1 capsule by mouth daily in the afternoon.    [provider]  potassium chloride (KLOR-CON M) 10 MEQ tablet Take 2 tablets (20 mEq total) by mouth daily. 07/20/22   Wanda Plump, MD  QUEtiapine (SEROQUEL) 50 MG tablet Take 1 tablet (50 mg total) by mouth at bedtime. 10/19/22   Ihor Austin, NP      Allergies    Dust mite mixed allergen ext [mite (d. farinae)]    Review of Systems   Review of Systems  Unable to perform ROS: Dementia    Physical Exam Updated Vital Signs BP 136/78 (BP Location: Left Arm)   Pulse 98   Temp 98.6 F (37 C) (Oral)   Resp 14   Ht 5\' 5"  (1.651 m)    Wt 98.4 kg   SpO2 95%   BMI 36.10 kg/m  Physical Exam Vitals and nursing note reviewed.  Constitutional:      Appearance: She is well-developed.  HENT:     Head: Normocephalic and atraumatic.  Pulmonary:     Effort: Pulmonary effort is normal.  Abdominal:     Palpations: Abdomen is soft.     Tenderness: There is no abdominal tenderness.  Musculoskeletal:     Comments: No tenderness in thighs/hips. I was able to stand her up out of the wheelchair and she was able to ambulate and bear weight.  Skin:    General: Skin is warm and dry.  Neurological:     Mental Status: She is alert.     ED Results / Procedures / Treatments   Labs (all labs ordered are listed, but only abnormal results are displayed) Labs Reviewed  URINALYSIS, W/ REFLEX TO CULTURE (INFECTION SUSPECTED)    EKG None  Radiology CT ABDOMEN PELVIS W CONTRAST  Result Date: 10/31/2022 CLINICAL DATA:  Bowel obstruction EXAM: CT ABDOMEN AND PELVIS WITH CONTRAST TECHNIQUE: Multidetector CT imaging of the abdomen and pelvis was performed using the standard protocol following bolus administration of intravenous contrast. RADIATION DOSE REDUCTION: This exam was performed according to the departmental dose-optimization program which includes automated exposure control, adjustment of the mA and/or kV according to patient size and/or use of iterative reconstruction technique. CONTRAST:  75mL OMNIPAQUE IOHEXOL 350 MG/ML SOLN COMPARISON:  02/08/2022 FINDINGS: Lower chest: No acute abnormality. Hepatobiliary: Status post cholecystectomy. Mild intra and moderate extrahepatic biliary ductal dilation with the extrahepatic bile duct measuring up to 15 mm in diameter. No intraluminal stone or distal obstructing mass is clearly identified and this may simply represent post cholecystectomy change. The liver is otherwise unremarkable. Pancreas: Unremarkable Spleen: Scattered calcified benign granuloma are seen within the spleen. The spleen is  otherwise unremarkable. Adrenals/Urinary Tract: The adrenal glands are unremarkable. Kidneys are normal in position. Moderate bilateral renal cortical atrophy. Scattered simple cortical cysts are seen within the kidneys bilaterally for which no follow-up imaging is recommended. No enhancing intrarenal masses. No perinephric fluid collections. No intrarenal or ureteral calculi. No hydronephrosis. The bladder is unremarkable. Stomach/Bowel: Stomach is within normal limits. Appendix appears normal. No evidence of bowel wall thickening, distention, or inflammatory changes. Vascular/Lymphatic: Aortic atherosclerosis. No enlarged abdominal or pelvic lymph nodes. Reproductive: Status post hysterectomy. No adnexal masses. Other: No abdominal wall hernia or abnormality. No abdominopelvic ascites. Musculoskeletal: Degenerative changes are seen within the lumbar spine. No acute bone abnormality. No lytic or blastic bone lesion. IMPRESSION: 1.  No acute intra-abdominal pathology identified. No bowel obstruction. 2. Mild intra and moderate extrahepatic biliary ductal dilation, possibly representing post cholecystectomy change. Correlation with liver enzymes would be helpful to exclude an obstructing process. If abnormal, MRCP examination may be helpful for further evaluation. 3. Moderate bilateral renal cortical atrophy. Aortic Atherosclerosis (ICD10-I70.0). Electronically Signed   By: Helyn Numbers M.D.   On: 10/31/2022 20:58    Procedures Procedures    Medications Ordered in ED Medications - No data to display  ED Course/ Medical Decision Making/ A&P                                 Medical Decision Making Amount and/or Complexity of Data Reviewed Independent Historian: spouse External Data Reviewed: notes.   I reviewed the workup from yesterday which showed normal WBC, normal LFTs, unremarkable CT of the abdomen pelvis.  No obvious fractures to her hips.  She is able to ambulate, so I highly doubt occult hip  fracture.  Has a benign abdominal exam.  Her vital signs are normal.  Husband would like a urinalysis obtained which has been ordered but unfortunately after several hours she has not urinated for [place].  She urinated right before an order was placed.  We discussed possible catheterization but he would prefer to work this up as an outpatient at this point and go home.  I think this is reasonable as I have a low suspicion for both UTI but also serious illness at this point.  I do not think repeat labs or imaging is needed.  Will discharge home with return precautions.        Final Clinical Impression(s) / ED Diagnoses Final diagnoses:  Bilateral hip pain    Rx / DC Orders ED Discharge Orders     None         Pricilla Loveless, MD 11/01/22 [facility]s

## 2022-11-01 NOTE — Telephone Encounter (Signed)
Noted  

## 2022-11-01 NOTE — Telephone Encounter (Signed)
Pt's husband called & stated the pt was seen in the ED and they advised that she follows up with pcp within a week. Appt has been scheduled for 9/13. However, pt's husband stated that the pt is currently not doing well & her symptoms have not improved. He wanted to let pcp know that he may have to take his wife back to the ER today. I advised pt that I will send note back.

## 2022-11-01 NOTE — Telephone Encounter (Signed)
FYI

## 2022-11-01 NOTE — ED Triage Notes (Signed)
Pt BIB EMS from home c/o bilateral hip pain. Pt was seen at [facility] for same yesterday. Hx dementia

## 2022-11-02 ENCOUNTER — Telehealth: Payer: Self-pay | Admitting: Internal Medicine

## 2022-11-02 DIAGNOSIS — R399 Unspecified symptoms and signs involving the genitourinary system: Secondary | ICD-10-CM

## 2022-11-02 NOTE — Telephone Encounter (Signed)
[name] (sig other DPR Ok) called stating that pt was in the ED and they were unable to check for a UTI because she was unable to urinate. He stated they sent her home with a sterile cup and asked for a specimen to be collected and brought to her PCP for testing. Advised him that orders would have to be put in to do the testing and a message would be sent back to look into this.

## 2022-11-02 NOTE — Telephone Encounter (Signed)
Will forward to Pt's endocrinology office.

## 2022-11-02 NOTE — Telephone Encounter (Signed)
Please advise 

## 2022-11-02 NOTE — Telephone Encounter (Signed)
Dee from [employer] would like to relay a message to pt's pcp. She states when the social worker was there the pt's husband told her about having to call EMS. States her blood sugar was dropping in the middle of the night to the 50s and he started to get concerned. He gave her "stuff" to bring it up to 70. Then he was still concerned and gave her more "stuff" and when ems arrived it was in the 200s. When asked what "stuff" she was referring to she stated she did not know because she was not here and she was just relaying a message. She states their notes did not specify. She also mentioned EMS recommended pt to start libre 3 so this does not happen again. Geraldine Contras stated to follow up with the pt's husband as she did not have any further knowledge of this situation.

## 2022-11-02 NOTE — Addendum Note (Signed)
Addended byConrad [place] D on: 11/02/2022 12:58 PM   Modules accepted: Orders

## 2022-11-02 NOTE — Telephone Encounter (Signed)
Spoke w/ Angel Green- informed that orders have been placed for him to drop off urine sample at his convenience. Angel Green verbalized understanding.

## 2022-11-02 NOTE — Telephone Encounter (Signed)
I spoke to Angel Green on behalf of his wife Angel Green she is asking for the [place] 3 Kit which is a upgrade to her to be sent to [facility]

## 2022-11-02 NOTE — Telephone Encounter (Signed)
AVS from most recent ER visit did recommend a UA urine culture.    Please enter orders, DX UTI, to bring sample in a sterile container.

## 2022-11-03 ENCOUNTER — Other Ambulatory Visit (INDEPENDENT_AMBULATORY_CARE_PROVIDER_SITE_OTHER): Payer: Medicare Other

## 2022-11-03 DIAGNOSIS — R399 Unspecified symptoms and signs involving the genitourinary system: Secondary | ICD-10-CM

## 2022-11-03 NOTE — Telephone Encounter (Signed)
Kit was sent to [facility]

## 2022-11-04 LAB — URINALYSIS, ROUTINE W REFLEX MICROSCOPIC
Bilirubin Urine: NEGATIVE
Hgb urine dipstick: NEGATIVE
Ketones, ur: NEGATIVE
Leukocytes,Ua: NEGATIVE
Nitrite: NEGATIVE
RBC / HPF: NONE SEEN (ref 0–?)
Specific Gravity, Urine: 1.015 (ref 1.000–1.030)
Total Protein, Urine: NEGATIVE
Urine Glucose: NEGATIVE
Urobilinogen, UA: 0.2 (ref 0.0–1.0)
pH: 7 (ref 5.0–8.0)

## 2022-11-05 ENCOUNTER — Encounter: Payer: Self-pay | Admitting: Internal Medicine

## 2022-11-05 ENCOUNTER — Ambulatory Visit: Payer: Medicare Other | Admitting: Internal Medicine

## 2022-11-05 ENCOUNTER — Ambulatory Visit (HOSPITAL_BASED_OUTPATIENT_CLINIC_OR_DEPARTMENT_OTHER)
Admission: RE | Admit: 2022-11-05 | Discharge: 2022-11-05 | Disposition: A | Discharge status: Home / Self Care | Payer: Medicare Other | Source: Ambulatory Visit | Attending: Internal Medicine | Admitting: Internal Medicine

## 2022-11-05 ENCOUNTER — Ambulatory Visit (INDEPENDENT_AMBULATORY_CARE_PROVIDER_SITE_OTHER): Payer: Medicare Other | Admitting: Internal Medicine

## 2022-11-05 VITALS — BP 138/92 | HR 68 | Temp 98.3°F | Resp 18 | Ht 65.0 in

## 2022-11-05 DIAGNOSIS — R32 Unspecified urinary incontinence: Secondary | ICD-10-CM

## 2022-11-05 DIAGNOSIS — M25551 Pain in right hip: Secondary | ICD-10-CM

## 2022-11-05 DIAGNOSIS — F03C18 Unspecified dementia, severe, with other behavioral disturbance: Secondary | ICD-10-CM

## 2022-11-05 DIAGNOSIS — R159 Full incontinence of feces: Secondary | ICD-10-CM

## 2022-11-05 DIAGNOSIS — M1611 Unilateral primary osteoarthritis, right hip: Secondary | ICD-10-CM | POA: Diagnosis not present

## 2022-11-05 NOTE — Assessment & Plan Note (Signed)
DM: LOV endocrinology 09/30/2022, A1c was 7.5. Dementia: LOV neurology 10/19/2022, Unable to complete MMSE, continue Aricept, Namenda and Seroquel. Bladder & bowel incontinence- pain, R hip- abdominal pain. Due to above symptoms, the patient has been taken to the ER 3 times. Workup has been negative including CT of the abdomen that only showed mild ductal dilatation.  (Unlikely to be clinically significant). I had a long conversation with the patient and the caregiver. B/B incontinence is part of the dementia syndrome, if she has fever chills, dysuria, blood in the stools or in the urine they need to seek medical attention. She has R hip pain and a ill-defined abdominal pain only in the mornings when she gets up. I wonder if  pain is generated by her hip.  No recent falls.  Plan: Check a x-ray of the hip, Tylenol as needed. Palliative care: Was referred October 11, 2022, the husband reports they have visit her and they are planning to provide physical therapy which is a very good idea. Vaccine advice provided RTC 4 months

## 2022-11-05 NOTE — Progress Notes (Signed)
Subjective:    Patient ID: Angel Green, female    DOB: [birth year], 82 y.o.   MRN: [id number]  DOS:  11/05/2022 Type of visit - description: ER f/u, here w/ her husband and care giver Dawn  LOV with me 05/24/2022. Since then, has seen neurology, endocrinology, went to the ER 3 times, notes reviewed.  August 24, 2022, ER visit, seen with generalized weakness.  Blood work unremarkable urinalysis with no infection, chest x-ray negative, CT head no acute.  ER October 31, 2022.  Abdominal pain.  Blood work okay, CT abdomen and pelvis: Mild intra and extrahepatic biliary duct dilatation, possibly postcholecystectomy change.  LFTs, lipase and CBC WNL  ER November 01, 2022, hip pain, abdominal pain, they noted workup from the prior day, no further testing was done.  November 03, 2022: At patient's husband  request we collected a urine sample, UA w/ some bacteria, urine culture pending   Review of Systems At this point, things are about the same. Husband is concerned about lack of BM or urine control as the hip and abdominal pain. They report that as soon as she gets up in the morning, she c/o  right hip pain and a ill-defined abdominal pain. Shortly after she starts moving and walking both the pain dissipate.  No nausea vomiting. No diarrhea or blood in the stools. No recent fall that they can tell. No fever or chills.   Past Medical History:  Diagnosis Date   Allergy    Dust Mites   Ankle pain    chronic   Diabetes mellitus, type 2 (HCC)    Hyperlipidemia    Hypertension    Memory loss    Osteoarthritis    RENAL INSUFFICIENCY, CHRONIC 04/16/2010   Rhinitis    allergic nos    Past Surgical History:  Procedure Laterality Date   CATARACT EXTRACTION     CHOLECYSTECTOMY     dental implants     PARTIAL HYSTERECTOMY     in her 30s    Current Outpatient Medications  Medication Instructions   acetaminophen (TYLENOL) 500 mg, Oral, Daily PRN   amLODipine (NORVASC) 5 mg, Oral,  Daily   aspirin 81 mg, Oral, Daily   atorvastatin (LIPITOR) 10 mg, Oral, Daily   azelastine (ASTELIN) 0.1 % nasal spray Each Nare, 2 times daily, Use in each nostril as directed   Blood Glucose Monitoring Suppl (FREESTYLE LITE) DEVI Use to monitor your blood sugars   Continuous Blood Gluc Sensor (FREESTYLE LIBRE 2 SENSOR) MISC 1 Device, Does not apply, Every 14 days   Cyanocobalamin (B-12 PO) 1 tablet, Oral, Daily, Takes 5000 mcg by mouth daily.   donepezil (ARICEPT) 10 mg, Oral, Daily at bedtime   Dulaglutide (TRULICITY) 1.5 MG/0.5ML SOPN Inject weekly   DULoxetine (CYMBALTA) 20 mg, Oral, Daily   EPINEPHrine (EPI-PEN) 0.3 mg,  Once   furosemide (LASIX) 40 mg, Oral, Daily   glucose blood (FREESTYLE LITE) test strip Use to monitor your blood sugars 4 times per day; E11.9   Insulin Lispro Prot & Lispro (HUMALOG 75/25 MIX) (75-25) 100 UNIT/ML Kwikpen INJECT 30 UNITS UNDER THE SKIN DAILY WITH BREAKFAST and 10 UNITS WITH SUPPER.   Insulin Pen Needle (SURE COMFORT PEN NEEDLES) 31G X 5 MM MISC USE 1 PEN NEEDLE PER DAY   Lancets (FREESTYLE) lancets Use to monitor blood sugars 4 times per day, once before each meal and once at bedtime   loratadine (CLARITIN) 10 MG tablet TAKE 1 TABLET DAILY   memantine (  NAMENDA) 10 mg, Oral, 2 times daily   montelukast (SINGULAIR) 10 mg, Oral, Daily at bedtime   Multiple Vitamins-Minerals (CENTRUM SILVER PO) 1 tablet, Oral, Daily,     Omega-3 Fatty Acids (FISH OIL PO) 1 capsule, Oral, Daily   potassium chloride (KLOR-CON M) 10 MEQ tablet 20 mEq, Oral, Daily   QUEtiapine (SEROQUEL) 50 mg, Oral, Daily at bedtime       Objective:   Physical Exam BP (!) 138/92   Pulse 68   Temp 98.3 F (36.8 C) (Oral)   Resp 18   Ht 5\' 5"  (1.651 m)   SpO2 95%   BMI 36.10 kg/m  General:   Well developed, NAD, BMI noted. HEENT:  Normocephalic .   Lungs:  CTA B Normal respiratory effort, no intercostal retractions, no accessory muscle use. Heart: RRR,  no murmur.  Lower  extremities: With the patient sitting in the chair, I rotate both hips, range of motion is symmetric and she did not complain of pain no TTP at the trochanteric bursa's. Skin: Not pale. Not jaundice Neurologic:  alert & pleasantly demented, does not follow commands.  Gait: Not tested  psych--  Behavior appropriate. No anxious or depressed appearing.      Assessment   Assessment   DM  Dr Everardo All-- Porfirio Mylar + Neuropathy: Per foot exam 12-2014 HTN ---change ACE to ARB is 03/2014 Hyperlipidemia CRI  dx 2012  Sees Dr Abel Presto  Morbid obesity DJD, had  chronic ankle pain Allergies -- dust mites , occ uses a inhaler , sees allergist, has shots q weeks started ~ 08-2014 Dementia:  MMSE 7/218: 24, rx Aricept.  B12, RPR, sed rate and TSH normal MMSE 07/2017 :13 worse MRI brain 6/19:  prominent right temporal lobe volume loss.  PLAN DM: LOV endocrinology 09/30/2022, A1c was 7.5. Dementia: LOV neurology 10/19/2022, Unable to complete MMSE, continue Aricept, Namenda and Seroquel. Bladder & bowel incontinence- pain, R hip- abdominal pain. Due to above symptoms, the patient has been taken to the ER 3 times. Workup has been negative including CT of the abdomen that only showed mild ductal dilatation.  (Unlikely to be clinically significant). I had a long conversation with the patient and the caregiver. B/B incontinence is part of the dementia syndrome, if she has fever chills, dysuria, blood in the stools or in the urine they need to seek medical attention. She has R hip pain and a ill-defined abdominal pain only in the mornings when she gets up. I wonder if  pain is generated by her hip.  No recent falls.  Plan: Check a x-ray of the hip, Tylenol as needed. Palliative care: Was referred October 11, 2022, the husband reports they have visit her and they are planning to provide physical therapy which is a very good idea. Vaccine advice provided RTC 4 months  Time spent today: 40 minutes.  Extensive chart  review.  I had a long conversation with the husband explaining the reason why the patient has bladder and bowel incontinence, listening therapy provided.

## 2022-11-05 NOTE — Patient Instructions (Addendum)
Vaccines I recommend: Flu shot this fall Covid booster new this fall  Will call you with the results of the urine test in few days.  Next visit with me In 4 months.     Please schedule it at the front desk    STOP BY THE FIRST FLOOR:  get the XR

## 2022-11-06 LAB — URINE CULTURE
MICRO NUMBER:: 15452105
SPECIMEN QUALITY:: ADEQUATE

## 2022-11-08 MED ORDER — SULFAMETHOXAZOLE-TRIMETHOPRIM 800-160 MG PO TABS: 1.0000 | ORAL_TABLET | Freq: Two times a day (BID) | ORAL | 0 refills | Status: DC

## 2022-11-08 NOTE — Addendum Note (Signed)
Addended byConrad [place] D on: 11/08/2022 09:10 AM   Modules accepted: Orders

## 2022-11-10 ENCOUNTER — Ambulatory Visit: Payer: Medicare Other | Admitting: Internal Medicine

## 2022-11-12 ENCOUNTER — Telehealth: Payer: Self-pay | Admitting: Internal Medicine

## 2022-11-12 NOTE — Telephone Encounter (Signed)
Caller/Agency: Geraldine Contras Marcell [place]) Callback Number: [phone] Requesting OT/PT/Skilled Nursing/Social Work/Speech Therapy: Nursing // PT Frequency: Evaluation for treatment

## 2022-11-12 NOTE — Telephone Encounter (Signed)
Spoke w/ Geraldine Contras- verbal orders given.

## 2022-11-15 ENCOUNTER — Telehealth: Payer: Self-pay

## 2022-11-15 NOTE — Telephone Encounter (Signed)
PT orders signed and faxed back to [facility] at [phone]. Form sent for scanning.

## 2022-11-16 ENCOUNTER — Telehealth: Payer: Self-pay | Admitting: Internal Medicine

## 2022-11-16 DIAGNOSIS — Z90710 Acquired absence of both cervix and uterus: Secondary | ICD-10-CM | POA: Diagnosis not present

## 2022-11-16 DIAGNOSIS — E1140 Type 2 diabetes mellitus with diabetic neuropathy, unspecified: Secondary | ICD-10-CM | POA: Diagnosis not present

## 2022-11-16 DIAGNOSIS — E1165 Type 2 diabetes mellitus with hyperglycemia: Secondary | ICD-10-CM | POA: Diagnosis not present

## 2022-11-16 DIAGNOSIS — G8929 Other chronic pain: Secondary | ICD-10-CM | POA: Diagnosis not present

## 2022-11-16 DIAGNOSIS — Z9049 Acquired absence of other specified parts of digestive tract: Secondary | ICD-10-CM | POA: Diagnosis not present

## 2022-11-16 DIAGNOSIS — Z7985 Long-term (current) use of injectable non-insulin antidiabetic drugs: Secondary | ICD-10-CM | POA: Diagnosis not present

## 2022-11-16 DIAGNOSIS — F03C18 Unspecified dementia, severe, with other behavioral disturbance: Secondary | ICD-10-CM | POA: Diagnosis not present

## 2022-11-16 DIAGNOSIS — M1990 Unspecified osteoarthritis, unspecified site: Secondary | ICD-10-CM | POA: Diagnosis not present

## 2022-11-16 DIAGNOSIS — E785 Hyperlipidemia, unspecified: Secondary | ICD-10-CM | POA: Diagnosis not present

## 2022-11-16 DIAGNOSIS — Z6837 Body mass index (BMI) 37.0-37.9, adult: Secondary | ICD-10-CM | POA: Diagnosis not present

## 2022-11-16 DIAGNOSIS — Z794 Long term (current) use of insulin: Secondary | ICD-10-CM | POA: Diagnosis not present

## 2022-11-16 DIAGNOSIS — I129 Hypertensive chronic kidney disease with stage 1 through stage 4 chronic kidney disease, or unspecified chronic kidney disease: Secondary | ICD-10-CM | POA: Diagnosis not present

## 2022-11-16 DIAGNOSIS — N1831 Chronic kidney disease, stage 3a: Secondary | ICD-10-CM | POA: Diagnosis not present

## 2022-11-16 DIAGNOSIS — E1122 Type 2 diabetes mellitus with diabetic chronic kidney disease: Secondary | ICD-10-CM | POA: Diagnosis not present

## 2022-11-16 DIAGNOSIS — J309 Allergic rhinitis, unspecified: Secondary | ICD-10-CM | POA: Diagnosis not present

## 2022-11-16 NOTE — Telephone Encounter (Signed)
Spoke w/ Kennith Center- she informed me that she has to have verbal orders from Dr. Drue Novel to contact endo for diabetic orders, informed that Dr. Drue Novel will be out of office until 10/7. She wanted to know if there is another provider that can give verbal orders. Informed I'd see if Oran Rein would agree to give verbal order.

## 2022-11-16 NOTE — Telephone Encounter (Signed)
French Ana from [employer] stated she will be taking over as pt's Charity fundraiser. She is requesting orders for diabetic teaching 1x a week for 4 weeks. Also requesting permission to contact endo for diabetic orders.

## 2022-11-17 ENCOUNTER — Other Ambulatory Visit: Payer: Self-pay | Admitting: Internal Medicine

## 2022-11-17 NOTE — Telephone Encounter (Signed)
Spoke w/ French Ana- verbal order given.

## 2022-11-18 DIAGNOSIS — I129 Hypertensive chronic kidney disease with stage 1 through stage 4 chronic kidney disease, or unspecified chronic kidney disease: Secondary | ICD-10-CM | POA: Diagnosis not present

## 2022-11-18 DIAGNOSIS — F03C18 Unspecified dementia, severe, with other behavioral disturbance: Secondary | ICD-10-CM | POA: Diagnosis not present

## 2022-11-18 DIAGNOSIS — N1831 Chronic kidney disease, stage 3a: Secondary | ICD-10-CM | POA: Diagnosis not present

## 2022-11-18 DIAGNOSIS — G8929 Other chronic pain: Secondary | ICD-10-CM | POA: Diagnosis not present

## 2022-11-18 DIAGNOSIS — E1165 Type 2 diabetes mellitus with hyperglycemia: Secondary | ICD-10-CM | POA: Diagnosis not present

## 2022-11-18 DIAGNOSIS — E1122 Type 2 diabetes mellitus with diabetic chronic kidney disease: Secondary | ICD-10-CM | POA: Diagnosis not present

## 2022-11-19 ENCOUNTER — Telehealth: Payer: Self-pay | Admitting: Internal Medicine

## 2022-11-19 NOTE — Telephone Encounter (Signed)
Caller/Agency: Coolidge B[place]ze Callback Number: [phone] Requesting OT/PT/Skilled Nursing/Social Work/Speech Therapy: PT for balance and fall prevention Frequency: 2x4, 1x4

## 2022-11-19 NOTE — Telephone Encounter (Signed)
Okay to give orders in your name?

## 2022-11-22 NOTE — Telephone Encounter (Signed)
Spoke w/ Nira Conn- verbal orders given.

## 2022-11-23 DIAGNOSIS — G301 Alzheimer's disease with late onset: Secondary | ICD-10-CM | POA: Diagnosis not present

## 2022-11-24 ENCOUNTER — Telehealth: Payer: Self-pay | Admitting: Endocrinology

## 2022-11-24 DIAGNOSIS — F03C18 Unspecified dementia, severe, with other behavioral disturbance: Secondary | ICD-10-CM | POA: Diagnosis not present

## 2022-11-24 DIAGNOSIS — I129 Hypertensive chronic kidney disease with stage 1 through stage 4 chronic kidney disease, or unspecified chronic kidney disease: Secondary | ICD-10-CM | POA: Diagnosis not present

## 2022-11-24 DIAGNOSIS — N1831 Chronic kidney disease, stage 3a: Secondary | ICD-10-CM | POA: Diagnosis not present

## 2022-11-24 DIAGNOSIS — E1122 Type 2 diabetes mellitus with diabetic chronic kidney disease: Secondary | ICD-10-CM | POA: Diagnosis not present

## 2022-11-24 DIAGNOSIS — E1165 Type 2 diabetes mellitus with hyperglycemia: Secondary | ICD-10-CM | POA: Diagnosis not present

## 2022-11-24 DIAGNOSIS — G8929 Other chronic pain: Secondary | ICD-10-CM | POA: Diagnosis not present

## 2022-11-24 NOTE — Telephone Encounter (Signed)
Angel Green 3 has been updated in Dr Nicholos Johns name

## 2022-11-24 NOTE — Telephone Encounter (Signed)
Patient's spouse came in to office today saying that patient needs a new prescription for all supplies pertaining to the Free Style Libre 3 sent to [place].

## 2022-11-25 DIAGNOSIS — E1122 Type 2 diabetes mellitus with diabetic chronic kidney disease: Secondary | ICD-10-CM | POA: Diagnosis not present

## 2022-11-25 DIAGNOSIS — F03C18 Unspecified dementia, severe, with other behavioral disturbance: Secondary | ICD-10-CM | POA: Diagnosis not present

## 2022-11-25 DIAGNOSIS — E1165 Type 2 diabetes mellitus with hyperglycemia: Secondary | ICD-10-CM | POA: Diagnosis not present

## 2022-11-25 DIAGNOSIS — N1831 Chronic kidney disease, stage 3a: Secondary | ICD-10-CM | POA: Diagnosis not present

## 2022-11-25 DIAGNOSIS — G8929 Other chronic pain: Secondary | ICD-10-CM | POA: Diagnosis not present

## 2022-11-25 DIAGNOSIS — I129 Hypertensive chronic kidney disease with stage 1 through stage 4 chronic kidney disease, or unspecified chronic kidney disease: Secondary | ICD-10-CM | POA: Diagnosis not present

## 2022-11-26 ENCOUNTER — Telehealth: Payer: Self-pay

## 2022-11-26 DIAGNOSIS — F03C18 Unspecified dementia, severe, with other behavioral disturbance: Secondary | ICD-10-CM | POA: Diagnosis not present

## 2022-11-26 DIAGNOSIS — I129 Hypertensive chronic kidney disease with stage 1 through stage 4 chronic kidney disease, or unspecified chronic kidney disease: Secondary | ICD-10-CM | POA: Diagnosis not present

## 2022-11-26 DIAGNOSIS — N1831 Chronic kidney disease, stage 3a: Secondary | ICD-10-CM | POA: Diagnosis not present

## 2022-11-26 DIAGNOSIS — E1165 Type 2 diabetes mellitus with hyperglycemia: Secondary | ICD-10-CM | POA: Diagnosis not present

## 2022-11-26 DIAGNOSIS — G8929 Other chronic pain: Secondary | ICD-10-CM | POA: Diagnosis not present

## 2022-11-26 DIAGNOSIS — E1122 Type 2 diabetes mellitus with diabetic chronic kidney disease: Secondary | ICD-10-CM | POA: Diagnosis not present

## 2022-11-26 NOTE — Telephone Encounter (Signed)
Transition Care Management Unsuccessful Follow-up Telephone Call  Date of discharge and from where:  [facility]  Attempts:  1st Attempt  Reason for unsuccessful TCM follow-up call:  No answer/busy   Lenard Forth [place]  [facility], [place] [facility], Phone: [phone] Website: [email or web address]

## 2022-11-29 ENCOUNTER — Telehealth: Payer: Self-pay

## 2022-11-29 DIAGNOSIS — E1165 Type 2 diabetes mellitus with hyperglycemia: Secondary | ICD-10-CM | POA: Diagnosis not present

## 2022-11-29 DIAGNOSIS — G8929 Other chronic pain: Secondary | ICD-10-CM | POA: Diagnosis not present

## 2022-11-29 DIAGNOSIS — F03C18 Unspecified dementia, severe, with other behavioral disturbance: Secondary | ICD-10-CM | POA: Diagnosis not present

## 2022-11-29 DIAGNOSIS — I129 Hypertensive chronic kidney disease with stage 1 through stage 4 chronic kidney disease, or unspecified chronic kidney disease: Secondary | ICD-10-CM | POA: Diagnosis not present

## 2022-11-29 DIAGNOSIS — N1831 Chronic kidney disease, stage 3a: Secondary | ICD-10-CM | POA: Diagnosis not present

## 2022-11-29 DIAGNOSIS — E1122 Type 2 diabetes mellitus with diabetic chronic kidney disease: Secondary | ICD-10-CM | POA: Diagnosis not present

## 2022-11-29 NOTE — Telephone Encounter (Signed)
Transition Care Management Follow-up Telephone Call Date of discharge and from where: Angel [facility] How have you been since you were released from the hospital? Doing fine and following up with providers. Patient has concerns with billing  Any questions or concerns? Yes with billing   Items Reviewed: Did the pt receive and understand the discharge instructions provided? Yes  Medications obtained and verified? Yes  Other? No  Any new allergies since your discharge? No  Dietary orders reviewed? No Do you have support at home? Yes     Follow up appointments reviewed:  PCP Hospital f/u appt confirmed? Yes  Scheduled to see  on  @ . Specialist Hospital f/u appt confirmed? Yes  Scheduled to see  on  @ . Are transportation arrangements needed? No  If their condition worsens, is the pt aware to call PCP or go to the Emergency Dept.? Yes Was the patient provided with contact information for the PCP's office or ED? Yes Was to pt encouraged to call back with questions or concerns? Yes

## 2022-12-01 DIAGNOSIS — G8929 Other chronic pain: Secondary | ICD-10-CM | POA: Diagnosis not present

## 2022-12-01 DIAGNOSIS — E1165 Type 2 diabetes mellitus with hyperglycemia: Secondary | ICD-10-CM | POA: Diagnosis not present

## 2022-12-01 DIAGNOSIS — E1122 Type 2 diabetes mellitus with diabetic chronic kidney disease: Secondary | ICD-10-CM | POA: Diagnosis not present

## 2022-12-01 DIAGNOSIS — F03C18 Unspecified dementia, severe, with other behavioral disturbance: Secondary | ICD-10-CM | POA: Diagnosis not present

## 2022-12-01 DIAGNOSIS — N1831 Chronic kidney disease, stage 3a: Secondary | ICD-10-CM | POA: Diagnosis not present

## 2022-12-01 DIAGNOSIS — I129 Hypertensive chronic kidney disease with stage 1 through stage 4 chronic kidney disease, or unspecified chronic kidney disease: Secondary | ICD-10-CM | POA: Diagnosis not present

## 2022-12-02 DIAGNOSIS — E1122 Type 2 diabetes mellitus with diabetic chronic kidney disease: Secondary | ICD-10-CM | POA: Diagnosis not present

## 2022-12-02 DIAGNOSIS — N1831 Chronic kidney disease, stage 3a: Secondary | ICD-10-CM | POA: Diagnosis not present

## 2022-12-02 DIAGNOSIS — F03C18 Unspecified dementia, severe, with other behavioral disturbance: Secondary | ICD-10-CM | POA: Diagnosis not present

## 2022-12-02 DIAGNOSIS — E1165 Type 2 diabetes mellitus with hyperglycemia: Secondary | ICD-10-CM | POA: Diagnosis not present

## 2022-12-02 DIAGNOSIS — G8929 Other chronic pain: Secondary | ICD-10-CM | POA: Diagnosis not present

## 2022-12-02 DIAGNOSIS — I129 Hypertensive chronic kidney disease with stage 1 through stage 4 chronic kidney disease, or unspecified chronic kidney disease: Secondary | ICD-10-CM | POA: Diagnosis not present

## 2022-12-07 ENCOUNTER — Other Ambulatory Visit: Payer: Self-pay | Admitting: Internal Medicine

## 2022-12-07 DIAGNOSIS — E1122 Type 2 diabetes mellitus with diabetic chronic kidney disease: Secondary | ICD-10-CM | POA: Diagnosis not present

## 2022-12-07 DIAGNOSIS — G8929 Other chronic pain: Secondary | ICD-10-CM | POA: Diagnosis not present

## 2022-12-07 DIAGNOSIS — N1831 Chronic kidney disease, stage 3a: Secondary | ICD-10-CM | POA: Diagnosis not present

## 2022-12-07 DIAGNOSIS — I129 Hypertensive chronic kidney disease with stage 1 through stage 4 chronic kidney disease, or unspecified chronic kidney disease: Secondary | ICD-10-CM | POA: Diagnosis not present

## 2022-12-07 DIAGNOSIS — E1165 Type 2 diabetes mellitus with hyperglycemia: Secondary | ICD-10-CM | POA: Diagnosis not present

## 2022-12-07 DIAGNOSIS — F03C18 Unspecified dementia, severe, with other behavioral disturbance: Secondary | ICD-10-CM | POA: Diagnosis not present

## 2022-12-09 DIAGNOSIS — E1122 Type 2 diabetes mellitus with diabetic chronic kidney disease: Secondary | ICD-10-CM | POA: Diagnosis not present

## 2022-12-09 DIAGNOSIS — G8929 Other chronic pain: Secondary | ICD-10-CM | POA: Diagnosis not present

## 2022-12-09 DIAGNOSIS — E1165 Type 2 diabetes mellitus with hyperglycemia: Secondary | ICD-10-CM | POA: Diagnosis not present

## 2022-12-09 DIAGNOSIS — F03C18 Unspecified dementia, severe, with other behavioral disturbance: Secondary | ICD-10-CM | POA: Diagnosis not present

## 2022-12-09 DIAGNOSIS — I129 Hypertensive chronic kidney disease with stage 1 through stage 4 chronic kidney disease, or unspecified chronic kidney disease: Secondary | ICD-10-CM | POA: Diagnosis not present

## 2022-12-09 DIAGNOSIS — N1831 Chronic kidney disease, stage 3a: Secondary | ICD-10-CM | POA: Diagnosis not present

## 2022-12-10 ENCOUNTER — Telehealth: Payer: Self-pay

## 2022-12-10 DIAGNOSIS — F03C18 Unspecified dementia, severe, with other behavioral disturbance: Secondary | ICD-10-CM | POA: Diagnosis not present

## 2022-12-10 DIAGNOSIS — I129 Hypertensive chronic kidney disease with stage 1 through stage 4 chronic kidney disease, or unspecified chronic kidney disease: Secondary | ICD-10-CM | POA: Diagnosis not present

## 2022-12-10 DIAGNOSIS — E1165 Type 2 diabetes mellitus with hyperglycemia: Secondary | ICD-10-CM | POA: Diagnosis not present

## 2022-12-10 DIAGNOSIS — E1140 Type 2 diabetes mellitus with diabetic neuropathy, unspecified: Secondary | ICD-10-CM | POA: Diagnosis not present

## 2022-12-10 DIAGNOSIS — J309 Allergic rhinitis, unspecified: Secondary | ICD-10-CM | POA: Diagnosis not present

## 2022-12-10 DIAGNOSIS — N1831 Chronic kidney disease, stage 3a: Secondary | ICD-10-CM | POA: Diagnosis not present

## 2022-12-10 DIAGNOSIS — E785 Hyperlipidemia, unspecified: Secondary | ICD-10-CM | POA: Diagnosis not present

## 2022-12-10 DIAGNOSIS — Z6837 Body mass index (BMI) 37.0-37.9, adult: Secondary | ICD-10-CM | POA: Diagnosis not present

## 2022-12-10 DIAGNOSIS — E1122 Type 2 diabetes mellitus with diabetic chronic kidney disease: Secondary | ICD-10-CM | POA: Diagnosis not present

## 2022-12-10 DIAGNOSIS — M1990 Unspecified osteoarthritis, unspecified site: Secondary | ICD-10-CM | POA: Diagnosis not present

## 2022-12-10 DIAGNOSIS — G8929 Other chronic pain: Secondary | ICD-10-CM | POA: Diagnosis not present

## 2022-12-10 NOTE — Telephone Encounter (Signed)
Plan of care signed and faxed back to [facility] at [phone]. Form sent for scanning.

## 2022-12-14 DIAGNOSIS — F03C18 Unspecified dementia, severe, with other behavioral disturbance: Secondary | ICD-10-CM | POA: Diagnosis not present

## 2022-12-14 DIAGNOSIS — G8929 Other chronic pain: Secondary | ICD-10-CM | POA: Diagnosis not present

## 2022-12-14 DIAGNOSIS — E1165 Type 2 diabetes mellitus with hyperglycemia: Secondary | ICD-10-CM | POA: Diagnosis not present

## 2022-12-14 DIAGNOSIS — E1122 Type 2 diabetes mellitus with diabetic chronic kidney disease: Secondary | ICD-10-CM | POA: Diagnosis not present

## 2022-12-14 DIAGNOSIS — I129 Hypertensive chronic kidney disease with stage 1 through stage 4 chronic kidney disease, or unspecified chronic kidney disease: Secondary | ICD-10-CM | POA: Diagnosis not present

## 2022-12-14 DIAGNOSIS — N1831 Chronic kidney disease, stage 3a: Secondary | ICD-10-CM | POA: Diagnosis not present

## 2022-12-15 DIAGNOSIS — E1165 Type 2 diabetes mellitus with hyperglycemia: Secondary | ICD-10-CM | POA: Diagnosis not present

## 2022-12-15 DIAGNOSIS — G8929 Other chronic pain: Secondary | ICD-10-CM | POA: Diagnosis not present

## 2022-12-15 DIAGNOSIS — F03C18 Unspecified dementia, severe, with other behavioral disturbance: Secondary | ICD-10-CM | POA: Diagnosis not present

## 2022-12-15 DIAGNOSIS — I129 Hypertensive chronic kidney disease with stage 1 through stage 4 chronic kidney disease, or unspecified chronic kidney disease: Secondary | ICD-10-CM | POA: Diagnosis not present

## 2022-12-15 DIAGNOSIS — E1122 Type 2 diabetes mellitus with diabetic chronic kidney disease: Secondary | ICD-10-CM | POA: Diagnosis not present

## 2022-12-15 DIAGNOSIS — N1831 Chronic kidney disease, stage 3a: Secondary | ICD-10-CM | POA: Diagnosis not present

## 2022-12-16 DIAGNOSIS — I129 Hypertensive chronic kidney disease with stage 1 through stage 4 chronic kidney disease, or unspecified chronic kidney disease: Secondary | ICD-10-CM | POA: Diagnosis not present

## 2022-12-16 DIAGNOSIS — Z794 Long term (current) use of insulin: Secondary | ICD-10-CM | POA: Diagnosis not present

## 2022-12-16 DIAGNOSIS — N1831 Chronic kidney disease, stage 3a: Secondary | ICD-10-CM | POA: Diagnosis not present

## 2022-12-16 DIAGNOSIS — Z90710 Acquired absence of both cervix and uterus: Secondary | ICD-10-CM | POA: Diagnosis not present

## 2022-12-16 DIAGNOSIS — G8929 Other chronic pain: Secondary | ICD-10-CM | POA: Diagnosis not present

## 2022-12-16 DIAGNOSIS — E1140 Type 2 diabetes mellitus with diabetic neuropathy, unspecified: Secondary | ICD-10-CM | POA: Diagnosis not present

## 2022-12-16 DIAGNOSIS — F03C18 Unspecified dementia, severe, with other behavioral disturbance: Secondary | ICD-10-CM | POA: Diagnosis not present

## 2022-12-16 DIAGNOSIS — Z7985 Long-term (current) use of injectable non-insulin antidiabetic drugs: Secondary | ICD-10-CM | POA: Diagnosis not present

## 2022-12-16 DIAGNOSIS — J309 Allergic rhinitis, unspecified: Secondary | ICD-10-CM | POA: Diagnosis not present

## 2022-12-16 DIAGNOSIS — Z6837 Body mass index (BMI) 37.0-37.9, adult: Secondary | ICD-10-CM | POA: Diagnosis not present

## 2022-12-16 DIAGNOSIS — E1165 Type 2 diabetes mellitus with hyperglycemia: Secondary | ICD-10-CM | POA: Diagnosis not present

## 2022-12-16 DIAGNOSIS — Z9049 Acquired absence of other specified parts of digestive tract: Secondary | ICD-10-CM | POA: Diagnosis not present

## 2022-12-16 DIAGNOSIS — M1990 Unspecified osteoarthritis, unspecified site: Secondary | ICD-10-CM | POA: Diagnosis not present

## 2022-12-16 DIAGNOSIS — E785 Hyperlipidemia, unspecified: Secondary | ICD-10-CM | POA: Diagnosis not present

## 2022-12-16 DIAGNOSIS — E1122 Type 2 diabetes mellitus with diabetic chronic kidney disease: Secondary | ICD-10-CM | POA: Diagnosis not present

## 2022-12-20 DIAGNOSIS — E1122 Type 2 diabetes mellitus with diabetic chronic kidney disease: Secondary | ICD-10-CM | POA: Diagnosis not present

## 2022-12-20 DIAGNOSIS — G8929 Other chronic pain: Secondary | ICD-10-CM | POA: Diagnosis not present

## 2022-12-20 DIAGNOSIS — N1831 Chronic kidney disease, stage 3a: Secondary | ICD-10-CM | POA: Diagnosis not present

## 2022-12-20 DIAGNOSIS — E1165 Type 2 diabetes mellitus with hyperglycemia: Secondary | ICD-10-CM | POA: Diagnosis not present

## 2022-12-20 DIAGNOSIS — I129 Hypertensive chronic kidney disease with stage 1 through stage 4 chronic kidney disease, or unspecified chronic kidney disease: Secondary | ICD-10-CM | POA: Diagnosis not present

## 2022-12-20 DIAGNOSIS — F03C18 Unspecified dementia, severe, with other behavioral disturbance: Secondary | ICD-10-CM | POA: Diagnosis not present

## 2022-12-24 DIAGNOSIS — N1831 Chronic kidney disease, stage 3a: Secondary | ICD-10-CM | POA: Diagnosis not present

## 2022-12-24 DIAGNOSIS — E1122 Type 2 diabetes mellitus with diabetic chronic kidney disease: Secondary | ICD-10-CM | POA: Diagnosis not present

## 2022-12-24 DIAGNOSIS — F03C18 Unspecified dementia, severe, with other behavioral disturbance: Secondary | ICD-10-CM | POA: Diagnosis not present

## 2022-12-24 DIAGNOSIS — G301 Alzheimer's disease with late onset: Secondary | ICD-10-CM | POA: Diagnosis not present

## 2022-12-24 DIAGNOSIS — E1165 Type 2 diabetes mellitus with hyperglycemia: Secondary | ICD-10-CM | POA: Diagnosis not present

## 2022-12-24 DIAGNOSIS — G8929 Other chronic pain: Secondary | ICD-10-CM | POA: Diagnosis not present

## 2022-12-24 DIAGNOSIS — I129 Hypertensive chronic kidney disease with stage 1 through stage 4 chronic kidney disease, or unspecified chronic kidney disease: Secondary | ICD-10-CM | POA: Diagnosis not present

## 2022-12-27 DIAGNOSIS — G301 Alzheimer's disease with late onset: Secondary | ICD-10-CM | POA: Diagnosis not present

## 2022-12-28 DIAGNOSIS — G301 Alzheimer's disease with late onset: Secondary | ICD-10-CM | POA: Diagnosis not present

## 2022-12-29 DIAGNOSIS — E1165 Type 2 diabetes mellitus with hyperglycemia: Secondary | ICD-10-CM | POA: Diagnosis not present

## 2022-12-29 DIAGNOSIS — N1831 Chronic kidney disease, stage 3a: Secondary | ICD-10-CM | POA: Diagnosis not present

## 2022-12-29 DIAGNOSIS — G8929 Other chronic pain: Secondary | ICD-10-CM | POA: Diagnosis not present

## 2022-12-29 DIAGNOSIS — I129 Hypertensive chronic kidney disease with stage 1 through stage 4 chronic kidney disease, or unspecified chronic kidney disease: Secondary | ICD-10-CM | POA: Diagnosis not present

## 2022-12-29 DIAGNOSIS — F03C18 Unspecified dementia, severe, with other behavioral disturbance: Secondary | ICD-10-CM | POA: Diagnosis not present

## 2022-12-29 DIAGNOSIS — G301 Alzheimer's disease with late onset: Secondary | ICD-10-CM | POA: Diagnosis not present

## 2022-12-29 DIAGNOSIS — E1122 Type 2 diabetes mellitus with diabetic chronic kidney disease: Secondary | ICD-10-CM | POA: Diagnosis not present

## 2022-12-31 DIAGNOSIS — G301 Alzheimer's disease with late onset: Secondary | ICD-10-CM | POA: Diagnosis not present

## 2022-12-31 DIAGNOSIS — F02C0 Dementia in other diseases classified elsewhere, severe, without behavioral disturbance, psychotic disturbance, mood disturbance, and anxiety: Secondary | ICD-10-CM | POA: Diagnosis not present

## 2023-01-03 ENCOUNTER — Telehealth: Payer: Self-pay | Admitting: Adult Health

## 2023-01-03 NOTE — Telephone Encounter (Signed)
Patients husband came into the office to ask for a call for some advice. Patients brother passed away and he wanted some advise on if he should take her or even tell her due to her condition. He isn't sure what it will do to her mental state. She has been having difficulty getting her up and she just wants to sleep any lay down. He has been getting her up, bathes her and has PT coming so they have to encourage her to do her therapy. PT has seen changes as well like her husband does past few weeks.

## 2023-01-03 NOTE — Telephone Encounter (Signed)
This would have to be a decision that husband/family will need to make. Increased stress in a situation like this can impact cognition but benefit vs risk will need to be weighed out. Her cognition will likely continue to gradually decline overtime in setting of Alzheimer's dementia but if there has been more of an acute decline, would recommend follow up with PCP to rule out other contributing factors such as infection or medication side effect.

## 2023-01-03 NOTE — Telephone Encounter (Signed)
Contacted pt spouse back, informed him of NP recommendations.He decided her decline in her cognition was acute.  He verbally understood recommendation and was appreciative.

## 2023-01-04 DIAGNOSIS — F02C0 Dementia in other diseases classified elsewhere, severe, without behavioral disturbance, psychotic disturbance, mood disturbance, and anxiety: Secondary | ICD-10-CM | POA: Diagnosis not present

## 2023-01-04 DIAGNOSIS — G301 Alzheimer's disease with late onset: Secondary | ICD-10-CM | POA: Diagnosis not present

## 2023-01-05 ENCOUNTER — Telehealth: Payer: Self-pay | Admitting: Internal Medicine

## 2023-01-05 ENCOUNTER — Other Ambulatory Visit: Payer: Self-pay | Admitting: Internal Medicine

## 2023-01-05 DIAGNOSIS — G8929 Other chronic pain: Secondary | ICD-10-CM | POA: Diagnosis not present

## 2023-01-05 DIAGNOSIS — E1122 Type 2 diabetes mellitus with diabetic chronic kidney disease: Secondary | ICD-10-CM | POA: Diagnosis not present

## 2023-01-05 DIAGNOSIS — F02C0 Dementia in other diseases classified elsewhere, severe, without behavioral disturbance, psychotic disturbance, mood disturbance, and anxiety: Secondary | ICD-10-CM | POA: Diagnosis not present

## 2023-01-05 DIAGNOSIS — F03C18 Unspecified dementia, severe, with other behavioral disturbance: Secondary | ICD-10-CM | POA: Diagnosis not present

## 2023-01-05 DIAGNOSIS — N1831 Chronic kidney disease, stage 3a: Secondary | ICD-10-CM | POA: Diagnosis not present

## 2023-01-05 DIAGNOSIS — E1165 Type 2 diabetes mellitus with hyperglycemia: Secondary | ICD-10-CM | POA: Diagnosis not present

## 2023-01-05 DIAGNOSIS — E876 Hypokalemia: Secondary | ICD-10-CM

## 2023-01-05 DIAGNOSIS — G301 Alzheimer's disease with late onset: Secondary | ICD-10-CM | POA: Diagnosis not present

## 2023-01-05 DIAGNOSIS — I129 Hypertensive chronic kidney disease with stage 1 through stage 4 chronic kidney disease, or unspecified chronic kidney disease: Secondary | ICD-10-CM | POA: Diagnosis not present

## 2023-01-05 NOTE — Telephone Encounter (Signed)
Please advise 

## 2023-01-05 NOTE — Telephone Encounter (Signed)
ASAP, collect a UA urine culture, Dx UTI Further advised with the UA Needs office visit if fever, chills, increased confusion

## 2023-01-05 NOTE — Telephone Encounter (Signed)
Spoke w/ Marylene Land- informed of PCP recommendations. Marylene Land verbalized understanding.

## 2023-01-05 NOTE — Telephone Encounter (Signed)
Angela from [facility] states pt's urine has a very strong ammonia smell and she is not able to pee in a cup to test the urine. She would like to know if pcp will call in antibiotics or order a urine test this week to [facility]. States they were supposed to be discharging  pt this week and pt will be out of town next week.

## 2023-01-06 ENCOUNTER — Ambulatory Visit: Payer: Medicare Other | Admitting: Endocrinology

## 2023-01-06 DIAGNOSIS — F02C0 Dementia in other diseases classified elsewhere, severe, without behavioral disturbance, psychotic disturbance, mood disturbance, and anxiety: Secondary | ICD-10-CM | POA: Diagnosis not present

## 2023-01-06 DIAGNOSIS — G301 Alzheimer's disease with late onset: Secondary | ICD-10-CM | POA: Diagnosis not present

## 2023-01-07 ENCOUNTER — Telehealth: Payer: Self-pay | Admitting: *Deleted

## 2023-01-07 DIAGNOSIS — I129 Hypertensive chronic kidney disease with stage 1 through stage 4 chronic kidney disease, or unspecified chronic kidney disease: Secondary | ICD-10-CM | POA: Diagnosis not present

## 2023-01-07 DIAGNOSIS — F03C18 Unspecified dementia, severe, with other behavioral disturbance: Secondary | ICD-10-CM | POA: Diagnosis not present

## 2023-01-07 DIAGNOSIS — G8929 Other chronic pain: Secondary | ICD-10-CM | POA: Diagnosis not present

## 2023-01-07 DIAGNOSIS — E1122 Type 2 diabetes mellitus with diabetic chronic kidney disease: Secondary | ICD-10-CM | POA: Diagnosis not present

## 2023-01-07 DIAGNOSIS — N390 Urinary tract infection, site not specified: Secondary | ICD-10-CM | POA: Diagnosis not present

## 2023-01-07 DIAGNOSIS — N1831 Chronic kidney disease, stage 3a: Secondary | ICD-10-CM | POA: Diagnosis not present

## 2023-01-07 DIAGNOSIS — E1165 Type 2 diabetes mellitus with hyperglycemia: Secondary | ICD-10-CM | POA: Diagnosis not present

## 2023-01-07 MED ORDER — SULFAMETHOXAZOLE-TRIMETHOPRIM 800-160 MG PO TABS: 1.0000 | ORAL_TABLET | Freq: Two times a day (BID) | ORAL | 0 refills | Status: DC

## 2023-01-07 NOTE — Telephone Encounter (Signed)
I spoke with the husband. The patient looks okay except that she is less talkative than usual. She is drinking fluids. Urine has been collected thus ABX sent (bactrim). Asked him to check BPs regularly, if they are persistently low (90s or less), if she looks poorly or has fever: Go to the ER.

## 2023-01-07 NOTE — Telephone Encounter (Signed)
Neka from [facility] called to report that pt's bp is 90/60 but is asymptomatic.  Fredderick Phenix is there to collect pt's urine for UA and culture.

## 2023-01-07 NOTE — Telephone Encounter (Signed)
Just an FYI

## 2023-01-10 DIAGNOSIS — F0390 Unspecified dementia without behavioral disturbance: Secondary | ICD-10-CM | POA: Diagnosis not present

## 2023-01-10 DIAGNOSIS — G301 Alzheimer's disease with late onset: Secondary | ICD-10-CM | POA: Diagnosis not present

## 2023-01-10 DIAGNOSIS — I129 Hypertensive chronic kidney disease with stage 1 through stage 4 chronic kidney disease, or unspecified chronic kidney disease: Secondary | ICD-10-CM | POA: Diagnosis not present

## 2023-01-10 DIAGNOSIS — N1831 Chronic kidney disease, stage 3a: Secondary | ICD-10-CM | POA: Diagnosis not present

## 2023-01-10 DIAGNOSIS — N2581 Secondary hyperparathyroidism of renal origin: Secondary | ICD-10-CM | POA: Diagnosis not present

## 2023-01-10 DIAGNOSIS — E785 Hyperlipidemia, unspecified: Secondary | ICD-10-CM | POA: Diagnosis not present

## 2023-01-10 DIAGNOSIS — F02C0 Dementia in other diseases classified elsewhere, severe, without behavioral disturbance, psychotic disturbance, mood disturbance, and anxiety: Secondary | ICD-10-CM | POA: Diagnosis not present

## 2023-01-10 DIAGNOSIS — N189 Chronic kidney disease, unspecified: Secondary | ICD-10-CM | POA: Diagnosis not present

## 2023-01-10 DIAGNOSIS — D631 Anemia in chronic kidney disease: Secondary | ICD-10-CM | POA: Diagnosis not present

## 2023-01-10 DIAGNOSIS — E1122 Type 2 diabetes mellitus with diabetic chronic kidney disease: Secondary | ICD-10-CM | POA: Diagnosis not present

## 2023-01-10 DIAGNOSIS — Z6841 Body Mass Index (BMI) 40.0 and over, adult: Secondary | ICD-10-CM | POA: Diagnosis not present

## 2023-01-10 NOTE — Telephone Encounter (Signed)
Called [facility]- lmom for Marylene Land to have them resend UA and urine culture results

## 2023-01-11 ENCOUNTER — Telehealth: Payer: Self-pay | Admitting: Internal Medicine

## 2023-01-11 DIAGNOSIS — N1831 Chronic kidney disease, stage 3a: Secondary | ICD-10-CM | POA: Diagnosis not present

## 2023-01-11 DIAGNOSIS — F03C18 Unspecified dementia, severe, with other behavioral disturbance: Secondary | ICD-10-CM | POA: Diagnosis not present

## 2023-01-11 DIAGNOSIS — F02C0 Dementia in other diseases classified elsewhere, severe, without behavioral disturbance, psychotic disturbance, mood disturbance, and anxiety: Secondary | ICD-10-CM | POA: Diagnosis not present

## 2023-01-11 DIAGNOSIS — G301 Alzheimer's disease with late onset: Secondary | ICD-10-CM | POA: Diagnosis not present

## 2023-01-11 DIAGNOSIS — E1165 Type 2 diabetes mellitus with hyperglycemia: Secondary | ICD-10-CM | POA: Diagnosis not present

## 2023-01-11 DIAGNOSIS — E1122 Type 2 diabetes mellitus with diabetic chronic kidney disease: Secondary | ICD-10-CM | POA: Diagnosis not present

## 2023-01-11 DIAGNOSIS — G8929 Other chronic pain: Secondary | ICD-10-CM | POA: Diagnosis not present

## 2023-01-11 DIAGNOSIS — I129 Hypertensive chronic kidney disease with stage 1 through stage 4 chronic kidney disease, or unspecified chronic kidney disease: Secondary | ICD-10-CM | POA: Diagnosis not present

## 2023-01-11 LAB — CBC AND DIFFERENTIAL
HCT: 44 (ref 36–46)
Hemoglobin: 14.8 (ref 12.0–16.0)
Neutrophils Absolute: 2.7
Platelets: 204 10*3/uL (ref 150–400)
WBC: 5.3

## 2023-01-11 LAB — COMPREHENSIVE METABOLIC PANEL
Albumin: 4 (ref 3.5–5.0)
Calcium: 10.2 (ref 8.7–10.7)
eGFR: 25

## 2023-01-11 LAB — IRON,TIBC AND FERRITIN PANEL
%SAT: 27
Iron: 80
TIBC: 291
UIBC: 211

## 2023-01-11 LAB — BASIC METABOLIC PANEL
BUN: 12 (ref 4–21)
CO2: 26 — AB (ref 13–22)
Chloride: 101 (ref 99–108)
Creatinine: 2 — AB (ref 0.5–1.1)
Glucose: 156
Potassium: 4.5 meq/L (ref 3.5–5.1)
Sodium: 138 (ref 137–147)

## 2023-01-11 LAB — PROTEIN / CREATININE RATIO, URINE
Albumin, U: 3.8
Creatinine, Urine: 116.9

## 2023-01-11 LAB — CBC: RBC: 4.66 (ref 3.87–5.11)

## 2023-01-11 LAB — MICROALBUMIN / CREATININE URINE RATIO
Albumin, Urine POC: 3.8
Creatinine, POC: 116.9 mg/dL
Microalb Creat Ratio: 3

## 2023-01-11 NOTE — Telephone Encounter (Signed)
Urine culture: Mixed flora. Advised patient's husband: Take Bactrim 1 tablet daily for 1 week then stop

## 2023-01-11 NOTE — Telephone Encounter (Signed)
LMOM informing Pt's husband of recommendations. Urine culture results sent for scanning.

## 2023-01-11 NOTE — Telephone Encounter (Signed)
Angel Green wanted to report to PCP that the patient started on backtrim and it interacted w/ potassium. The kidney doctor changed it from 2x/day to 1x/day. Please advise at [phone].

## 2023-01-11 NOTE — Telephone Encounter (Signed)
I'm ok w/ the changes

## 2023-01-12 DIAGNOSIS — F02C0 Dementia in other diseases classified elsewhere, severe, without behavioral disturbance, psychotic disturbance, mood disturbance, and anxiety: Secondary | ICD-10-CM | POA: Diagnosis not present

## 2023-01-12 DIAGNOSIS — G301 Alzheimer's disease with late onset: Secondary | ICD-10-CM | POA: Diagnosis not present

## 2023-01-14 DIAGNOSIS — G301 Alzheimer's disease with late onset: Secondary | ICD-10-CM | POA: Diagnosis not present

## 2023-01-14 DIAGNOSIS — F02C0 Dementia in other diseases classified elsewhere, severe, without behavioral disturbance, psychotic disturbance, mood disturbance, and anxiety: Secondary | ICD-10-CM | POA: Diagnosis not present

## 2023-01-17 ENCOUNTER — Encounter: Payer: Self-pay | Admitting: Internal Medicine

## 2023-01-17 ENCOUNTER — Other Ambulatory Visit: Payer: Self-pay | Admitting: Internal Medicine

## 2023-01-17 DIAGNOSIS — F02C0 Dementia in other diseases classified elsewhere, severe, without behavioral disturbance, psychotic disturbance, mood disturbance, and anxiety: Secondary | ICD-10-CM | POA: Diagnosis not present

## 2023-01-17 DIAGNOSIS — G301 Alzheimer's disease with late onset: Secondary | ICD-10-CM | POA: Diagnosis not present

## 2023-01-18 DIAGNOSIS — F02C0 Dementia in other diseases classified elsewhere, severe, without behavioral disturbance, psychotic disturbance, mood disturbance, and anxiety: Secondary | ICD-10-CM | POA: Diagnosis not present

## 2023-01-18 DIAGNOSIS — G301 Alzheimer's disease with late onset: Secondary | ICD-10-CM | POA: Diagnosis not present

## 2023-01-21 DIAGNOSIS — F02C0 Dementia in other diseases classified elsewhere, severe, without behavioral disturbance, psychotic disturbance, mood disturbance, and anxiety: Secondary | ICD-10-CM | POA: Diagnosis not present

## 2023-01-21 DIAGNOSIS — G301 Alzheimer's disease with late onset: Secondary | ICD-10-CM | POA: Diagnosis not present

## 2023-01-23 DIAGNOSIS — G301 Alzheimer's disease with late onset: Secondary | ICD-10-CM | POA: Diagnosis not present

## 2023-01-24 ENCOUNTER — Telehealth: Payer: Self-pay

## 2023-01-24 NOTE — Telephone Encounter (Signed)
Plan of care signed and faxed back to [facility] at [phone]. Form sent for scanning.

## 2023-01-25 DIAGNOSIS — G301 Alzheimer's disease with late onset: Secondary | ICD-10-CM | POA: Diagnosis not present

## 2023-01-25 DIAGNOSIS — F02C0 Dementia in other diseases classified elsewhere, severe, without behavioral disturbance, psychotic disturbance, mood disturbance, and anxiety: Secondary | ICD-10-CM | POA: Diagnosis not present

## 2023-01-27 DIAGNOSIS — G301 Alzheimer's disease with late onset: Secondary | ICD-10-CM | POA: Diagnosis not present

## 2023-01-27 DIAGNOSIS — F02C0 Dementia in other diseases classified elsewhere, severe, without behavioral disturbance, psychotic disturbance, mood disturbance, and anxiety: Secondary | ICD-10-CM | POA: Diagnosis not present

## 2023-01-28 DIAGNOSIS — G301 Alzheimer's disease with late onset: Secondary | ICD-10-CM | POA: Diagnosis not present

## 2023-01-28 DIAGNOSIS — F02C0 Dementia in other diseases classified elsewhere, severe, without behavioral disturbance, psychotic disturbance, mood disturbance, and anxiety: Secondary | ICD-10-CM | POA: Diagnosis not present

## 2023-02-02 ENCOUNTER — Emergency Department (HOSPITAL_COMMUNITY): Payer: Medicare Other

## 2023-02-02 ENCOUNTER — Ambulatory Visit: Payer: Medicare Other | Admitting: Endocrinology

## 2023-02-02 ENCOUNTER — Encounter (HOSPITAL_COMMUNITY): Payer: Self-pay

## 2023-02-02 ENCOUNTER — Emergency Department (HOSPITAL_COMMUNITY)
Admission: EM | Admit: 2023-02-02 | Discharge: 2023-02-02 | Disposition: A | Discharge status: Home / Self Care | Payer: Medicare Other | Attending: Emergency Medicine | Admitting: Emergency Medicine

## 2023-02-02 ENCOUNTER — Other Ambulatory Visit: Payer: Self-pay | Admitting: Internal Medicine

## 2023-02-02 ENCOUNTER — Other Ambulatory Visit: Payer: Self-pay

## 2023-02-02 DIAGNOSIS — Z79899 Other long term (current) drug therapy: Secondary | ICD-10-CM | POA: Insufficient documentation

## 2023-02-02 DIAGNOSIS — N189 Chronic kidney disease, unspecified: Secondary | ICD-10-CM | POA: Diagnosis not present

## 2023-02-02 DIAGNOSIS — I10 Essential (primary) hypertension: Secondary | ICD-10-CM | POA: Diagnosis not present

## 2023-02-02 DIAGNOSIS — R55 Syncope and collapse: Secondary | ICD-10-CM | POA: Diagnosis not present

## 2023-02-02 DIAGNOSIS — R739 Hyperglycemia, unspecified: Secondary | ICD-10-CM | POA: Diagnosis not present

## 2023-02-02 DIAGNOSIS — R4182 Altered mental status, unspecified: Secondary | ICD-10-CM | POA: Insufficient documentation

## 2023-02-02 DIAGNOSIS — Z1231 Encounter for screening mammogram for malignant neoplasm of breast: Secondary | ICD-10-CM

## 2023-02-02 DIAGNOSIS — I129 Hypertensive chronic kidney disease with stage 1 through stage 4 chronic kidney disease, or unspecified chronic kidney disease: Secondary | ICD-10-CM | POA: Diagnosis not present

## 2023-02-02 LAB — CBC WITH DIFFERENTIAL/PLATELET
Abs Immature Granulocytes: 0.01 10*3/uL (ref 0.00–0.07)
Basophils Absolute: 0 10*3/uL (ref 0.0–0.1)
Basophils Relative: 1 %
Eosinophils Absolute: 0.1 10*3/uL (ref 0.0–0.5)
Eosinophils Relative: 2 %
HCT: 45.2 % (ref 36.0–46.0)
Hemoglobin: 14.8 g/dL (ref 12.0–15.0)
Immature Granulocytes: 0 %
Lymphocytes Relative: 37 %
Lymphs Abs: 1.9 10*3/uL (ref 0.7–4.0)
MCH: 32 pg (ref 26.0–34.0)
MCHC: 32.7 g/dL (ref 30.0–36.0)
MCV: 97.8 fL (ref 80.0–100.0)
Monocytes Absolute: 0.5 10*3/uL (ref 0.1–1.0)
Monocytes Relative: 9 %
Neutro Abs: 2.7 10*3/uL (ref 1.7–7.7)
Neutrophils Relative %: 51 %
Platelets: 200 10*3/uL (ref 150–400)
RBC: 4.62 MIL/uL (ref 3.87–5.11)
RDW: 14.2 % (ref 11.5–15.5)
WBC: 5.2 10*3/uL (ref 4.0–10.5)
nRBC: 0 % (ref 0.0–0.2)

## 2023-02-02 LAB — URINALYSIS, ROUTINE W REFLEX MICROSCOPIC
Bilirubin Urine: NEGATIVE
Glucose, UA: 50 mg/dL — AB
Hgb urine dipstick: NEGATIVE
Ketones, ur: NEGATIVE mg/dL
Nitrite: NEGATIVE
Protein, ur: NEGATIVE mg/dL
Specific Gravity, Urine: 1.011 (ref 1.005–1.030)
pH: 8 (ref 5.0–8.0)

## 2023-02-02 LAB — COMPREHENSIVE METABOLIC PANEL
ALT: 24 U/L (ref 0–44)
AST: 25 U/L (ref 15–41)
Albumin: 3.4 g/dL — ABNORMAL LOW (ref 3.5–5.0)
Alkaline Phosphatase: 70 U/L (ref 38–126)
Anion gap: 9 (ref 5–15)
BUN: 15 mg/dL (ref 8–23)
CO2: 26 mmol/L (ref 22–32)
Calcium: 9.5 mg/dL (ref 8.9–10.3)
Chloride: 103 mmol/L (ref 98–111)
Creatinine, Ser: 1.52 mg/dL — ABNORMAL HIGH (ref 0.44–1.00)
GFR, Estimated: 34 mL/min — ABNORMAL LOW (ref >60)
Glucose, Bld: 229 mg/dL — ABNORMAL HIGH (ref 70–99)
Potassium: 4.2 mmol/L (ref 3.5–5.1)
Sodium: 138 mmol/L (ref 135–145)
Total Bilirubin: 0.5 mg/dL (ref <1.2)
Total Protein: 7.4 g/dL (ref 6.5–8.1)

## 2023-02-02 LAB — CBG MONITORING, ED: Glucose-Capillary: 212 mg/dL — ABNORMAL HIGH (ref 70–99)

## 2023-02-02 MED ORDER — SODIUM CHLORIDE 0.9 % IV BOLUS
1000.0000 mL | INTRAVENOUS | Status: AC
  Administered 2023-02-02: 1000 mL via INTRAVENOUS

## 2023-02-02 NOTE — ED Notes (Signed)
[name] stated that he did 2 bladder scans which showed no urine and 2 in and outs which produced no urine.

## 2023-02-02 NOTE — ED Provider Notes (Signed)
[place] [facility] AT [facility] Provider Note   CSN: [id number] Arrival date & time: 02/02/23  1143     History  Chief Complaint  Patient presents with   Altered Mental Status    Angel Green is a 82 y.o. female.  HPI Patient history obtained through triage note Patient unable to give history No family currently at bedside  82 year old female history of hyperlipidemia, hypertension, CKD, relative impairment, presents today with reports that her family found her on the toilet unresponsive and called 911.  EMS reported patient was responsive only to self and recently had a UTI and just finished antibiotics.  She is reported to have a hard time answering questions.     Home Medications Prior to Admission medications   Medication Sig Start Date End Date Taking? Authorizing Provider  atorvastatin (LIPITOR) 10 MG tablet Take 1 tablet (10 mg total) by mouth daily. 12/07/22   Wanda Plump, MD  acetaminophen (TYLENOL) 500 MG tablet Take 500 mg by mouth daily as needed for moderate pain.    [provider]  amLODipine (NORVASC) 5 MG tablet Take 1 tablet (5 mg total) by mouth daily. 01/26/22   Olive Bass, FNP  azelastine (ASTELIN) 0.1 % nasal spray Place into both nostrils 2 (two) times daily. Use in each nostril as directed    [provider]  Blood Glucose Monitoring Suppl (FREESTYLE LITE) DEVI Use to monitor your blood sugars 12/26/17   Romero Belling, MD  Continuous Blood Gluc Sensor (FREESTYLE LIBRE 2 SENSOR) MISC 1 Device by Does not apply route every 14 (fourteen) days. 03/11/22   Reather Littler, MD  Cyanocobalamin (B-12 PO) Take 1 tablet by mouth daily. Takes 5000 mcg by mouth daily.    [provider]  donepezil (ARICEPT) 10 MG tablet Take 1 tablet (10 mg total) by mouth at bedtime. 10/14/22   Wanda Plump, MD  Dulaglutide (TRULICITY) 1.5 MG/0.5ML SOPN Inject weekly 08/03/22   Reather Littler, MD  DULoxetine (CYMBALTA) 20 MG  capsule TAKE 1 CAPSULE DAILY 11/17/22   Worthy Rancher B, FNP  EPINEPHrine 0.3 mg/0.3 mL IJ SOAJ injection Inject 0.3 mg into the muscle once. Reported on 06/30/2015 Patient not taking: Reported on 11/05/2022    [provider]  furosemide (LASIX) 20 MG tablet Take 2 tablets (40 mg total) by mouth daily. 01/17/23   Wanda Plump, MD  glucose blood (FREESTYLE LITE) test strip Use to monitor your blood sugars 4 times per day; E11.9 05/07/22   Reather Littler, MD  Insulin Lispro Prot & Lispro (HUMALOG 75/25 MIX) (75-25) 100 UNIT/ML Kwikpen INJECT 30 UNITS UNDER THE SKIN DAILY WITH BREAKFAST and 10 UNITS WITH SUPPER. 09/30/22   Thapa, [place], MD  Insulin Pen Needle (SURE COMFORT PEN NEEDLES) 31G X 5 MM MISC USE 1 PEN NEEDLE PER DAY 08/31/22   Reather Littler, MD  Lancets (FREESTYLE) lancets Use to monitor blood sugars 4 times per day, once before each meal and once at bedtime 12/26/17   Romero Belling, MD  loratadine (CLARITIN) 10 MG tablet TAKE 1 TABLET DAILY 03/22/22   Wanda Plump, MD  memantine (NAMENDA) 10 MG tablet Take 1 tablet (10 mg total) by mouth 2 (two) times daily. 10/19/22   Ihor Austin, NP  montelukast (SINGULAIR) 10 MG tablet Take 10 mg by mouth at bedtime.    [provider]  Multiple Vitamins-Minerals (CENTRUM SILVER PO) Take 1 tablet by mouth daily.    [provider]  Omega-3 Fatty Acids (FISH OIL PO) Take 1 capsule by mouth daily in the afternoon.    [provider]  potassium chloride (KLOR-CON) 10 MEQ tablet Take 2 tablets (20 mEq total) by mouth daily. 01/06/23   Wanda Plump, MD  QUEtiapine (SEROQUEL) 50 MG tablet Take 1 tablet (50 mg total) by mouth at bedtime. 10/19/22   Ihor Austin, NP  sulfamethoxazole-trimethoprim (BACTRIM DS) 800-160 MG tablet Take 1 tablet by mouth 2 (two) times daily. 01/07/23   Wanda Plump, MD      Allergies    Dust mite mixed allergen ext [mite (d. farinae)]    Review of Systems   Review of Systems  Physical Exam Updated Vital  Signs BP (!) 144/72   Pulse 70   Temp (S) (!) 97.1 F (36.2 C) (Rectal)   Resp 16   Ht 1.651 m (5\' 5" )   Wt 98 kg   SpO2 100%   BMI 35.94 kg/m  Physical Exam Vitals reviewed.  HENT:     Head: Normocephalic.     Right Ear: External ear normal.     Left Ear: External ear normal.     Nose: Nose normal.  Eyes:     Pupils: Pupils are equal, round, and reactive to light.  Cardiovascular:     Rate and Rhythm: Normal rate and regular rhythm.  Pulmonary:     Effort: Pulmonary effort is normal.  Abdominal:     General: Abdomen is flat.     Palpations: Abdomen is soft.  Musculoskeletal:        General: Normal range of motion.     Cervical back: Normal range of motion.  Skin:    General: Skin is warm.     Capillary Refill: Capillary refill takes less than 2 seconds.  Neurological:     General: No focal deficit present.     Mental Status: She is alert.     Comments: Patient is oriented to person but unable to give place or date. This is at baseline per husband     ED Results / Procedures / Treatments   Labs (all labs ordered are listed, but only abnormal results are displayed) Labs Reviewed  COMPREHENSIVE METABOLIC PANEL - Abnormal; Notable for the following components:      Result Value   Glucose, Bld 229 (*)    Creatinine, Ser 1.52 (*)    Albumin 3.4 (*)    GFR, Estimated 34 (*)    All other components within normal limits  CBG MONITORING, ED - Abnormal; Notable for the following components:   Glucose-Capillary 212 (*)    All other components within normal limits  CBC WITH DIFFERENTIAL/PLATELET  URINALYSIS, ROUTINE W REFLEX MICROSCOPIC    EKG EKG Interpretation Date/Time:  Wednesday February 02 2023 12:41:48 EST Ventricular Rate:  71 PR Interval:  167 QRS Duration:  97 QT Interval:  414 QTC Calculation: 450 R Axis:   -11  Text Interpretation: Sinus rhythm Left ventricular hypertrophy Tall R wave in V2, consider RVH or PMI Confirmed by Margarita Grizzle [phone]) on  02/02/2023 3:12:28 PM  Radiology DG Chest Port 1 View  Result Date: 02/02/2023 CLINICAL DATA:  Altered mental status. EXAM: PORTABLE CHEST 1 VIEW COMPARISON:  08/24/2022. FINDINGS: Bilateral lung fields are clear. Bilateral costophrenic angles are clear. Stable cardio-mediastinal silhouette. No acute osseous abnormalities. The soft tissues are within normal limits. IMPRESSION: No active disease. Electronically Signed   By: Jules Schick M.D.   On: 02/02/2023 15:12  CT Head Wo Contrast  Result Date: 02/02/2023 CLINICAL DATA:  Mental status change, unknown cause EXAM: CT HEAD WITHOUT CONTRAST TECHNIQUE: Contiguous axial images were obtained from the base of the skull through the vertex without intravenous contrast. RADIATION DOSE REDUCTION: This exam was performed according to the departmental dose-optimization program which includes automated exposure control, adjustment of the mA and/or kV according to patient size and/or use of iterative reconstruction technique. COMPARISON:  CT head 08/24/2022. FINDINGS: Brain: No evidence of acute infarction, hemorrhage, hydrocephalus, extra-axial collection or mass lesion/mass effect. Cerebral atrophy with asymmetric right temporal lobe atrophy. Vascular: No hyperdense vessel. Skull: No acute fracture. Sinuses/Orbits: Clear sinuses.  No acute orbital findings. Other: No mastoid effusions. IMPRESSION: 1. No evidence of acute intracranial abnormality. 2. 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 Electronically Signed   By: Feliberto Harts M.D.   On: 02/02/2023 14:58    Procedures Procedures    Medications Ordered in ED Medications  sodium chloride 0.9 % bolus 1,000 mL (1,000 mLs Intravenous New Bag/Given 02/02/23 1600)    ED Course/ Medical Decision Making/ A&P Clinical Course as of 02/02/23 1630  Wed Feb 02, 2023  1506 CT head reviewed interpreted no evidence of acute abnormality is noted radiologist  interpretation notes no evidence of acute intracranial abnormality with chronic cerebral atrophy [DR]  1516 Chest x-Vaneta Hammontree reviewed interpreted no evidence of abnormality radiologist interpretation concurs [DR]  1545 CBC reviewed interpreted no evidence of acute abnormality noted Complete metabolic panel reviewed interpreted significant for hyperglycemia, no evidence of DKA [DR]  1546 Stable renal function [DR]    Clinical Course User Index [DR] Margarita Grizzle, MD                                 Medical Decision Making Amount and/or Complexity of Data Reviewed Labs: ordered. Radiology: ordered.   82 yo female with known dementia and now less responsive.  Differential diagnosis is broad and includes but is not limited to acute intracranial hemorrhage, infection, urinary tract infection, acute metabolic derangement including hypoglycemia, and electrolyte abnormalities Patient evaluated with CT of head that does not show any evidence of acute abnormality Complete metabolic panel is reviewed interpreted send for mild hyperglycemia and CKD with creatinine 1.5 improved from first prior CBC is normal Awaiting ua Plan reevaluation and dispo after ua back Patient signed out to Dr. Shirlee Limerick who is excepted and will disposition as appropriate       Final Clinical Impression(s) / ED Diagnoses Final diagnoses:  Altered mental status, unspecified altered mental status type    Rx / DC Orders ED Discharge Orders     None         Margarita Grizzle, MD 02/02/23 1630

## 2023-02-02 NOTE — ED Notes (Signed)
Bladder scan x 3 shows 0 mls

## 2023-02-02 NOTE — ED Notes (Signed)
In/out cath attempted, no urine returned. Anatomy confirmed with 3 people.

## 2023-02-02 NOTE — ED Triage Notes (Signed)
EMS reports family found her on the toilet unresponsive so they called 911.  Patient only oriented to self and recently had UTI and just finished antibiotics.  Patient having a hard time answering questions.  No fever.  EMS report urine has a foul smell.

## 2023-02-02 NOTE — ED Notes (Signed)
PT given some fluids orally and placed on a bed pan to see if she can produce some urine.

## 2023-02-03 ENCOUNTER — Telehealth: Payer: Self-pay | Admitting: Internal Medicine

## 2023-02-03 ENCOUNTER — Telehealth: Payer: Self-pay

## 2023-02-03 NOTE — Telephone Encounter (Signed)
Angel Green (spouse DPR Ok) called to schedule ED Follow Up for pt. Pt was seen in ED on 12.11.24. Consulted with Marylene Land due to pt modifiers interfering with ability to schedule. Scheduled appt for 12.20.24 @ 11:20a per pt availability. Please advise if pt needs to be seen sooner.

## 2023-02-03 NOTE — Transitions of Care (Post Inpatient/ED Visit) (Signed)
   02/03/2023  Name: Angel Green MRN: [id number] DOB: 1940-11-03  Today's TOC FU Call Status: Today's TOC FU Call Status:: Unsuccessful Call (1st Attempt) Unsuccessful Call (1st Attempt) Date: 02/03/23  Attempted to reach the patient regarding the most recent Inpatient/ED visit.  Follow Up Plan: Additional outreach attempts will be made to reach the patient to complete the Transitions of Care (Post Inpatient/ED visit) call.   Signature Karena Addison, LPN [facility] Nurse Health Advisor Direct Dial [phone]

## 2023-02-04 NOTE — Telephone Encounter (Signed)
12/20 is fine

## 2023-02-06 ENCOUNTER — Other Ambulatory Visit: Payer: Self-pay | Admitting: Internal Medicine

## 2023-02-07 DIAGNOSIS — F02C0 Dementia in other diseases classified elsewhere, severe, without behavioral disturbance, psychotic disturbance, mood disturbance, and anxiety: Secondary | ICD-10-CM | POA: Diagnosis not present

## 2023-02-07 DIAGNOSIS — G301 Alzheimer's disease with late onset: Secondary | ICD-10-CM | POA: Diagnosis not present

## 2023-02-08 ENCOUNTER — Ambulatory Visit: Payer: Medicare Other | Admitting: Internal Medicine

## 2023-02-08 DIAGNOSIS — G301 Alzheimer's disease with late onset: Secondary | ICD-10-CM | POA: Diagnosis not present

## 2023-02-08 DIAGNOSIS — F02C0 Dementia in other diseases classified elsewhere, severe, without behavioral disturbance, psychotic disturbance, mood disturbance, and anxiety: Secondary | ICD-10-CM | POA: Diagnosis not present

## 2023-02-08 NOTE — Transitions of Care (Post Inpatient/ED Visit) (Signed)
02/08/2023  Name: Angel Green MRN: [id number] DOB: January 29, 1941  Today's TOC FU Call Status: Today's TOC FU Call Status:: Successful TOC FU Call Completed Unsuccessful Call (1st Attempt) Date: 02/03/23 [facility] FU Call Complete Date: 02/08/23 Patient's Name and Date of Birth confirmed.  Transition Care Management Follow-up Telephone Call Date of Discharge: 02/02/23 Discharge Facility: [facility]) Type of Discharge: Emergency Department Reason for ED Visit: Other: (altered mental) How have you been since you were released from the hospital?: Better Any questions or concerns?: No  Items Reviewed: Did you receive and understand the discharge instructions provided?: Yes Medications obtained,verified, and reconciled?: Yes (Medications Reviewed) Any new allergies since your discharge?: No Do you have support at home?: Yes People in Home: spouse  Medications Reviewed Today: Medications Reviewed Today     Reviewed by Karena Addison, LPN (Licensed Practical Nurse) on 02/08/23 at 1615  Med List Status: <None>   Medication Order Taking? Sig Documenting Provider Last Dose Status Informant  amLODipine (NORVASC) 5 MG tablet [id number]  Take 1 tablet (5 mg total) by mouth daily. Wanda Plump, MD  Active   atorvastatin (LIPITOR) 10 MG tablet [id number] No Take 1 tablet (10 mg total) by mouth daily. Wanda Plump, MD 02/01/2023 Active Spouse/Significant Other, Pharmacy Records  Blood Glucose Monitoring Suppl (FREESTYLE LITE) DEVI [id number] No Use to monitor your blood sugars Romero Belling, MD Taking Active Spouse/Significant Other, Pharmacy Records  Continuous Blood Gluc Sensor (FREESTYLE LIBRE 2 SENSOR) [place] [id number] No 1 Device by Does not apply route every 14 (fourteen) days. Reather Littler, MD Taking Active Spouse/Significant Other, Pharmacy Records  Cyanocobalamin (B-12 PO) [id number] No Take 1 tablet by mouth daily. Takes 5000 mcg by mouth daily. [provider] 02/01/2023 Active  Spouse/Significant Other, Pharmacy Records  donepezil (ARICEPT) 10 MG tablet [id number] No Take 1 tablet (10 mg total) by mouth at bedtime. Wanda Plump, MD 02/01/2023 Active Spouse/Significant Other, Pharmacy Records  Dulaglutide (TRULICITY) 1.5 MG/0.5ML SOPN [id number] No Inject weekly  Patient taking differently: Inject 1.5 mg into the skin once a week. Every Saturday   Reather Littler, MD Past Week Active Spouse/Significant Other, Pharmacy Records  DULoxetine (CYMBALTA) 20 MG capsule [id number] No TAKE 1 CAPSULE DAILY Eulis Foster, FNP 02/01/2023 Active Spouse/Significant Other, Pharmacy Records  EPINEPHrine 0.3 mg/0.3 mL IJ SOAJ injection [id number] No Inject 0.3 mg into the muscle once. Reported on 06/30/2015 [provider] Unk Active Spouse/Significant Other, Pharmacy Records           Med Note Artemio Aly Feb 02, 2023  5:34 PM) In need of a new refill  furosemide (LASIX) 20 MG tablet [id number] No Take 2 tablets (40 mg total) by mouth daily. Wanda Plump, MD 02/01/2023 Active Spouse/Significant Other, Pharmacy Records  glucose blood (FREESTYLE LITE) test strip [id number] No Use to monitor your blood sugars 4 times per day; E11.9 Reather Littler, MD Taking Active Spouse/Significant Other, Pharmacy Records  Insulin Lispro Prot & Lispro (HUMALOG 75/25 MIX) (75-25) 100 UNIT/ML Stephanie Coup [id number] No INJECT 30 UNITS UNDER THE SKIN DAILY WITH BREAKFAST and 10 UNITS WITH SUPPER. Thapa, [place], MD 02/01/2023 Active Spouse/Significant Other, Pharmacy Records  Insulin Pen Needle (SURE COMFORT PEN NEEDLES) 31G X 5 MM MISC [id number] No USE 1 PEN NEEDLE PER DAY Reather Littler, MD Taking Active Spouse/Significant Other, Pharmacy Records  Lancets (FREESTYLE) lancets [id number] No Use to monitor blood sugars 4 times per day, once before each meal and once at bedtime Romero Belling, MD  Taking Active Spouse/Significant Other, Pharmacy Records  loratadine (CLARITIN) 10 MG tablet [id number] No TAKE 1 TABLET  DAILY  Patient taking differently: Take 10 mg by mouth daily as needed for allergies.   Wanda Plump, MD Unk Active Spouse/Significant Other, Pharmacy Records  memantine [facility]) 10 MG tablet [id number] No Take 1 tablet (10 mg total) by mouth 2 (two) times daily. Ihor Austin, NP 02/01/2023 Active Spouse/Significant Other, Pharmacy Records  montelukast (SINGULAIR) 10 MG tablet [id number] No Take 10 mg by mouth at bedtime.  Patient not taking: Reported on 02/02/2023   [provider] Not Taking Active Spouse/Significant Other, Pharmacy Records           Med Note Artemio Aly Feb 02, 2023  5:51 PM) Per Dr. Tiajuana Amass there is not recent fills on this medication  Multiple Vitamins-Minerals (CENTRUM SILVER PO) [id number] No Take 1 tablet by mouth daily. [provider] 02/01/2023 Active Spouse/Significant Other, Pharmacy Records  Omega-3 Fatty Acids (FISH OIL PO) [id number] No Take 1 capsule by mouth daily in the afternoon. [provider] 02/01/2023 Active Spouse/Significant Other, Pharmacy Records           Med Note Yevette Edwards   Wed Feb 02, 2023  5:49 PM) Patients husband stated she has not been taking this everyday  potassium chloride (KLOR-CON) 10 MEQ tablet [id number] No Take 2 tablets (20 mEq total) by mouth daily. Wanda Plump, MD 02/01/2023 Active Spouse/Significant Other, Pharmacy Records  QUEtiapine (SEROQUEL) 50 MG tablet [id number] No Take 1 tablet (50 mg total) by mouth at bedtime. Ihor Austin, NP 02/01/2023 Active Spouse/Significant Other, Pharmacy Records            Home Care and Equipment/Supplies: Were Home Health Services Ordered?: NA Any new equipment or medical supplies ordered?: NA  Functional Questionnaire: Do you need assistance with bathing/showering or dressing?: No Do you need assistance with meal preparation?: Yes Do you need assistance with eating?: No Do you have difficulty maintaining continence: Yes Do you  need assistance with getting out of bed/getting out of a chair/moving?: No Do you have difficulty managing or taking your medications?: Yes  Follow up appointments reviewed: PCP Follow-up appointment confirmed?: Yes Date of PCP follow-up appointment?: 02/11/23 Follow-up Provider: [facility] Follow-up appointment confirmed?: NA Do you need transportation to your follow-up appointment?: No Do you understand care options if your condition(s) worsen?: Yes-patient verbalized understanding    SIGNATURE Karena Addison, LPN [facility] Nurse Health Advisor Direct Dial [phone]

## 2023-02-09 ENCOUNTER — Encounter: Payer: Self-pay | Admitting: Endocrinology

## 2023-02-09 ENCOUNTER — Ambulatory Visit: Payer: Medicare Other | Admitting: Endocrinology

## 2023-02-09 VITALS — BP 134/80 | HR 106 | Resp 20 | Ht 65.0 in | Wt 215.4 lb

## 2023-02-09 DIAGNOSIS — E1165 Type 2 diabetes mellitus with hyperglycemia: Secondary | ICD-10-CM | POA: Diagnosis not present

## 2023-02-09 DIAGNOSIS — Z794 Long term (current) use of insulin: Secondary | ICD-10-CM | POA: Diagnosis not present

## 2023-02-09 LAB — POCT GLYCOSYLATED HEMOGLOBIN (HGB A1C): Hemoglobin A1C: 7.6 % — AB (ref 4.0–5.6)

## 2023-02-09 NOTE — Patient Instructions (Signed)
Diabetes regimen:  Humalog mix 75/25: 30 units with breakfast and 10 units with supper.  Continue trulicity 1.5mg  weekly.  No change on diabetes medications.  Less potato and banana or similar foods.

## 2023-02-09 NOTE — Progress Notes (Signed)
Outpatient Endocrinology Note [place] Remmi Armenteros, MD  02/09/23  Patient's Name: Angel Green    DOB: 02/15/1941    MRN: [id number]                                                    REASON OF VISIT: Follow up of type 2 diabetes mellitus  PCP: Wanda Plump, MD  HISTORY OF PRESENT ILLNESS:   Angel Green is a 82 y.o. old female with past medical history listed below, is here for follow up  type 2 diabetes mellitus.   Pertinent Diabetes History: He was diagnosed with type 2 diabetes mellitus in 1999.  She has been on insulin therapy from 2010 and Trulicity was started in 2019.    Chronic Diabetes Complications : Retinopathy: no. Last ophthalmology exam was done on 04/2022, reportedly. Nephropathy: CKD, following with nephrology. Peripheral neuropathy:no Coronary artery disease: no Stroke: no  Relevant comorbidities and cardiovascular risk factors: Obesity: yes Body mass index is 35.84 kg/m.  Hypertension: yes Hyperlipidemia. Yes, on a statin.  Current / Home Diabetic regimen includes: Humulin mix 75/25 : 30 units daily in the morning with breakfast and 10 units with supper. Trulicity 1.5 mg weekly.  Prior diabetic medications: She used to be on Trulicity 4.5 mg weekly was decreased due to weight loss.  Glycemic data:     CONTINUOUS GLUCOSE MONITORING SYSTEM (CGMS) INTERPRETATION:                          FreeStyle Libre CGM-  Sensor Download (Sensor download was reviewed and summarized below.) Dates: December 5 to February 09, 2023, 14 days Sensor Average: 179  Glucose Management Indicator: 7.6%  % data captured: 92%   Impression: -Mostly acceptable blood sugar overnight, early morning fasting and in between the meals.  She has occasional hyperglycemia up to blood sugar in 300s at different times of the day especially in the late afternoon and sometime at bedtime.  On December 17 she had blood sugar in 50s in the early morning, likely sensory issues.  No concerning  hypoglycemia.  Hypoglycemia: Patient has no hypoglycemic episodes. Patient has hypoglycemia awareness.  Factors modifying glucose control: 1.  Diabetic diet assessment: 3 meals a day last meal of the day around 5 to 6 PM.  She eats breakfast around 11 AM.  Denies bedtime snack.  2.  Staying active or exercising: No formal exercise.  3.  Medication compliance: compliant all Of the time.  Interval history CGM data as reviewed above.  Occasional postprandial hyperglycemia otherwise acceptable blood sugar.  Hemoglobin A1c 7.6%.  Patient is accompanied by husband in the clinic.  No complaints today.  REVIEW OF SYSTEMS As per history of present illness.   PAST MEDICAL HISTORY: Past Medical History:  Diagnosis Date   Allergy    Dust Mites   Ankle pain    chronic   Diabetes mellitus, type 2 (HCC)    Hyperlipidemia    Hypertension    Memory loss    Osteoarthritis    RENAL INSUFFICIENCY, CHRONIC 04/16/2010   Rhinitis    allergic nos    PAST SURGICAL HISTORY: Past Surgical History:  Procedure Laterality Date   CATARACT EXTRACTION     CHOLECYSTECTOMY     dental implants     PARTIAL HYSTERECTOMY  in her 30s    ALLERGIES: Allergies  Allergen Reactions   Dust Mite Mixed Allergen Ext [Mite (D. Farinae)]     FAMILY HISTORY:  Family History  Problem Relation Age of Onset   Healthy Brother    Healthy Son    Healthy Son    Other Mother        unsure of history - "old age"   Other Father        unsure of history - "old age"   Heart attack Neg Hx    Colon cancer Neg Hx    Breast cancer Neg Hx    Stroke Neg Hx     SOCIAL HISTORY: Social History   Socioeconomic History   Marital status: Married    Spouse name: Not on file   Number of children: 2   Years of education: some college   Highest education level: Not on file  Occupational History   Occupation:  retired  Tobacco Use   Smoking status: Never   Smokeless tobacco: Never  Vaping Use   Vaping status:  Never Used  Substance and Sexual Activity   Alcohol use: No    Alcohol/week: 0.0 standard drinks of alcohol   Drug use: No   Sexual activity: Yes    Partners: Male  Other Topics Concern   Not on file  Social History Narrative   Husband is a Optician, dispensing.   Right-handed.   1 cup caffeine daily.   Lives at home with husband.          Social Drivers of Corporate investment banker Strain: Low Risk  (08/17/2021)   Overall Financial Resource Strain (CARDIA)    Difficulty of Paying Living Expenses: Not very hard  Food Insecurity: No Food Insecurity (02/11/2022)   Hunger Vital Sign    Worried About Running Out of Food in the Last Year: Never true    Ran Out of Food in the Last Year: Never true  Transportation Needs: No Transportation Needs (02/11/2022)   PRAPARE - Administrator, Civil Service (Medical): No    Lack of Transportation (Non-Medical): No  Physical Activity: Inactive (05/18/2022)   Exercise Vital Sign    Days of Exercise per Week: 0 days    Minutes of Exercise per Session: 0 min  Stress: Not on file  Social Connections: Not on file    MEDICATIONS:  Current Outpatient Medications  Medication Sig Dispense Refill   amLODipine (NORVASC) 5 MG tablet Take 1 tablet (5 mg total) by mouth daily. 90 tablet 1   atorvastatin (LIPITOR) 10 MG tablet Take 1 tablet (10 mg total) by mouth daily. 90 tablet 1   Blood Glucose Monitoring Suppl (FREESTYLE LITE) DEVI Use to monitor your blood sugars 1 each 0   Continuous Blood Gluc Sensor (FREESTYLE LIBRE 2 SENSOR) MISC 1 Device by Does not apply route every 14 (fourteen) days. 6 each 3   Cyanocobalamin (B-12 PO) Take 1 tablet by mouth daily. Takes 5000 mcg by mouth daily.     donepezil (ARICEPT) 10 MG tablet Take 1 tablet (10 mg total) by mouth at bedtime. 90 tablet 3   Dulaglutide (TRULICITY) 1.5 MG/0.5ML SOPN Inject weekly (Patient taking differently: Inject 1.5 mg into the skin once a week. Every Saturday) 6 mL 1   DULoxetine  (CYMBALTA) 20 MG capsule TAKE 1 CAPSULE DAILY 90 capsule 3   EPINEPHrine 0.3 mg/0.3 mL IJ SOAJ injection Inject 0.3 mg into the muscle once. Reported on 06/30/2015  furosemide (LASIX) 20 MG tablet Take 2 tablets (40 mg total) by mouth daily. 180 tablet 1   glucose blood (FREESTYLE LITE) test strip Use to monitor your blood sugars 4 times per day; E11.9 400 each 12   Insulin Lispro Prot & Lispro (HUMALOG 75/25 MIX) (75-25) 100 UNIT/ML Kwikpen INJECT 30 UNITS UNDER THE SKIN DAILY WITH BREAKFAST and 10 UNITS WITH SUPPER. 30 mL 3   Insulin Pen Needle (SURE COMFORT PEN NEEDLES) 31G X 5 MM MISC USE 1 PEN NEEDLE PER DAY 100 each 3   Lancets (FREESTYLE) lancets Use to monitor blood sugars 4 times per day, once before each meal and once at bedtime 400 each 12   loratadine (CLARITIN) 10 MG tablet TAKE 1 TABLET DAILY (Patient taking differently: Take 10 mg by mouth daily as needed for allergies.) 90 tablet 3   memantine (NAMENDA) 10 MG tablet Take 1 tablet (10 mg total) by mouth 2 (two) times daily. 180 tablet 3   montelukast (SINGULAIR) 10 MG tablet Take 10 mg by mouth at bedtime.     Multiple Vitamins-Minerals (CENTRUM SILVER PO) Take 1 tablet by mouth daily.     Omega-3 Fatty Acids (FISH OIL PO) Take 1 capsule by mouth daily in the afternoon.     potassium chloride (KLOR-CON) 10 MEQ tablet Take 2 tablets (20 mEq total) by mouth daily. 180 tablet 1   QUEtiapine (SEROQUEL) 50 MG tablet Take 1 tablet (50 mg total) by mouth at bedtime. 90 tablet 3   No current facility-administered medications for this visit.    PHYSICAL EXAM: Vitals:   02/09/23 1507 02/09/23 1510  BP: (!) 150/80 134/80  Pulse: (!) 106   Resp: 20   SpO2: 94%   Weight: 215 lb 6.4 oz (97.7 kg)   Height: 5\' 5"  (1.651 m)    Body mass index is 35.84 kg/m.  Wt Readings from Last 3 Encounters:  02/09/23 215 lb 6.4 oz (97.7 kg)  02/02/23 216 lb (98 kg)  11/01/22 216 lb 14.9 oz (98.4 kg)    General: Well developed, well nourished  female in no apparent distress.  HEENT: AT/[place], no external lesions.  Eyes: Conjunctiva clear and no icterus. Neck: Neck supple  Lungs: Respirations not labored Neurologic: Alert, oriented, normal speech Extremities / Skin: Dry.   Psychiatric: Does not appear depressed or anxious  Diabetic Foot Exam - Simple   No data filed     LABS Reviewed Lab Results  Component Value Date   HGBA1C 7.6 (A) 02/09/2023   HGBA1C 7.5 (A) 09/30/2022   HGBA1C 6.6 (A) 06/30/2022   Lab Results  Component Value Date   FRUCTOSAMINE 299 (H) 06/26/2020   FRUCTOSAMINE 370 (H) 04/22/2015   Lab Results  Component Value Date   CHOL 157 09/07/2021   HDL 57.30 09/07/2021   LDLCALC 76 09/07/2021   TRIG 123.0 09/07/2021   CHOLHDL 3 09/07/2021   Lab Results  Component Value Date   MICRALBCREAT 3.0 01/11/2023   MICRALBCREAT 0.7 04/09/2022   Lab Results  Component Value Date   CREATININE 1.52 (H) 02/02/2023   Lab Results  Component Value Date   GFR 29.75 (L) 04/12/2022    ASSESSMENT / PLAN  1. Uncontrolled type 2 diabetes mellitus with hyperglycemia, with long-term current use of insulin (HCC)     Diabetes Mellitus type 2, complicated by CKD - Diabetic status / severity: Fair control  Lab Results  Component Value Date   HGBA1C 7.6 (A) 02/09/2023    - Hemoglobin  A1c goal : <7.5-8%  Patient has fair control of her diabetes mellitus, given her age and comorbidities.    - Medications:   I) continue Humalog mix 75/25: 30 units with breakfast and start Humalog mix 10 units with supper.  II) continue Trulicity 1.5 mg weekly.  - Home glucose testing: Continue CGM or check blood sugar as needed. - Discussed/ Gave Hypoglycemia treatment plan.  # Consult : not required at this time.   # Annual urine for microalbuminuria/ creatinine ratio, no microalbuminuria currently, following with nephrology.   Last  Lab Results  Component Value Date   MICRALBCREAT 3.0 01/11/2023    # Foot check  nightly.  # Annual dilated diabetic eye exams.   - Diet: Make healthy diabetic food choices   2. Blood pressure  -  BP Readings from Last 1 Encounters:  02/09/23 134/80    - Control is in target.  - No change in current plans.  3. Lipid status / Hyperlipidemia - Last  Lab Results  Component Value Date   LDLCALC 76 09/07/2021   - Continue atorvastatin 10 mg daily.  Managed by primary care provider.  Diagnoses and all orders for this visit:  Uncontrolled type 2 diabetes mellitus with hyperglycemia, with long-term current use of insulin (HCC) -     POCT glycosylated hemoglobin (Hb A1C)    DISPOSITION Follow up in clinic in 3 months suggested.   All questions answered and patient verbalized understanding of the plan.  [place] Kapri Nero, MD [facility] [address] [place], [address] [place], [place] [address] Phone # [phone]  At least part of this note was generated using voice recognition software. Inadvertent word errors may have occurred, which were not recognized during the proofreading process.

## 2023-02-10 DIAGNOSIS — G301 Alzheimer's disease with late onset: Secondary | ICD-10-CM | POA: Diagnosis not present

## 2023-02-10 DIAGNOSIS — F02C0 Dementia in other diseases classified elsewhere, severe, without behavioral disturbance, psychotic disturbance, mood disturbance, and anxiety: Secondary | ICD-10-CM | POA: Diagnosis not present

## 2023-02-11 ENCOUNTER — Encounter: Payer: Self-pay | Admitting: Internal Medicine

## 2023-02-11 ENCOUNTER — Encounter: Payer: Self-pay | Admitting: Endocrinology

## 2023-02-11 ENCOUNTER — Ambulatory Visit (INDEPENDENT_AMBULATORY_CARE_PROVIDER_SITE_OTHER): Payer: Medicare Other | Admitting: Internal Medicine

## 2023-02-11 VITALS — BP 116/84 | HR 79 | Temp 97.6°F | Resp 18 | Ht 65.0 in

## 2023-02-11 DIAGNOSIS — E119 Type 2 diabetes mellitus without complications: Secondary | ICD-10-CM

## 2023-02-11 DIAGNOSIS — F03C18 Unspecified dementia, severe, with other behavioral disturbance: Secondary | ICD-10-CM | POA: Diagnosis not present

## 2023-02-11 NOTE — Progress Notes (Unsigned)
Subjective:    Patient ID: Angel Green, female    DOB: [birth year], 82 y.o.   MRN: [id number]  DOS:  02/11/2023 Type of visit - description: ER follow-up, here with her husband caregiver Asher Muir  Presented to the ER twice 1124. She was found on the toilet unresponsive. EMS reports that she was responsive to self. Prior to these, she had a UTI. Workup at the ER: Blood sugar 229, creatinine 1.5 at baseline, UA shows some bacteria, CBC normal, chest x-ray no acute, CT head no acute.  Both the husband and the caregiver state that she seems to be back to normal. Mental status fluctuates day-to-day but in general okay.  No fever or chills. Has no report LUTS No constipation, she has BMs daily. Urine normal color.   Review of Systems See above   Past Medical History:  Diagnosis Date   Allergy    Dust Mites   Ankle pain    chronic   Diabetes mellitus, type 2 (HCC)    Hyperlipidemia    Hypertension    Memory loss    Osteoarthritis    RENAL INSUFFICIENCY, CHRONIC 04/16/2010   Rhinitis    allergic nos    Past Surgical History:  Procedure Laterality Date   CATARACT EXTRACTION     CHOLECYSTECTOMY     dental implants     PARTIAL HYSTERECTOMY     in her 30s    Current Outpatient Medications  Medication Instructions   amLODipine (NORVASC) 5 mg, Oral, Daily   atorvastatin (LIPITOR) 10 mg, Oral, Daily   Blood Glucose Monitoring Suppl (FREESTYLE LITE) DEVI Use to monitor your blood sugars   Continuous Blood Gluc Sensor (FREESTYLE LIBRE 2 SENSOR) MISC 1 Device, Does not apply, Every 14 days   Cyanocobalamin (B-12 PO) 1 tablet, Daily   donepezil (ARICEPT) 10 mg, Oral, Daily at bedtime   Dulaglutide (TRULICITY) 1.5 MG/0.5ML SOPN Inject weekly   DULoxetine (CYMBALTA) 20 mg, Oral, Daily   EPINEPHrine (EPI-PEN) 0.3 mg,  Once   furosemide (LASIX) 40 mg, Oral, Daily   glucose blood (FREESTYLE LITE) test strip Use to monitor your blood sugars 4 times per day; E11.9   Insulin  Lispro Prot & Lispro (HUMALOG 75/25 MIX) (75-25) 100 UNIT/ML Kwikpen INJECT 30 UNITS UNDER THE SKIN DAILY WITH BREAKFAST and 10 UNITS WITH SUPPER.   Insulin Pen Needle (SURE COMFORT PEN NEEDLES) 31G X 5 MM MISC USE 1 PEN NEEDLE PER DAY   Lancets (FREESTYLE) lancets Use to monitor blood sugars 4 times per day, once before each meal and once at bedtime   loratadine (CLARITIN) 10 MG tablet TAKE 1 TABLET DAILY   memantine (NAMENDA) 10 mg, Oral, 2 times daily   montelukast (SINGULAIR) 10 mg, Daily at bedtime   Multiple Vitamins-Minerals (CENTRUM SILVER PO) 1 tablet, Daily   Omega-3 Fatty Acids (FISH OIL PO) 1 capsule, Daily   potassium chloride (KLOR-CON) 10 MEQ tablet 20 mEq, Oral, Daily   QUEtiapine (SEROQUEL) 50 mg, Oral, Daily at bedtime       Objective:   Physical Exam BP 116/84   Pulse 79   Temp 97.6 F (36.4 C) (Oral)   Resp 18   Ht 5\' 5"  (1.651 m)   SpO2 96%   BMI 35.84 kg/m  General:   Well developed, NAD, apathy noted, not interested in engaging, not a new observation.   Lungs:  CTA B Normal respiratory effort, no intercostal retractions, no accessory muscle use. Heart: RRR,  no murmur.  Lower  extremities: no pretibial edema bilaterally  Skin: Not pale. Not jaundice Neurologic:  alert & pleasantly demented, follow commands.  She is very quiet. Speech normal, gait not tested, sits in a wheelchair  psych--  Behavior appropriate. No anxious or depressed appearing.      Assessment     Assessment   DM  Dr Everardo All-- Porfirio Mylar + Neuropathy: Per foot exam 12-2014 HTN ---change ACE to ARB is 03/2014 Hyperlipidemia CRI  dx 2012  Sees Dr Abel Presto  Morbid obesity DJD, had  chronic ankle pain Allergies -- dust mites , occ uses a inhaler , sees allergist, has shots q weeks started ~ 08-2014 Dementia:  MMSE 7/218: 24, rx Aricept.  B12, RPR, sed rate and TSH normal MMSE 07/2017 :13 worse MRI brain 6/19:  prominent right temporal lobe volume loss.  PLAN Dementia: Recently seen  in the ER after being found unresponsive in the bathroom, she was responsive with EMS arrival. Workup in the ER negative, she is now back to her baseline. Plan: No change, watch for fever or chills, constipation etc. DM: Last A1c 7.6, ambulatory CBGs before meals in the 150s, after meals sometimes in the 200s, recommending to continue working with endocrinology RTC 3 months   9-13 DM: LOV endocrinology 09/30/2022, A1c was 7.5. Dementia: LOV neurology 10/19/2022, Unable to complete MMSE, continue Aricept, Namenda and Seroquel. Bladder & bowel incontinence- pain, R hip- abdominal pain. Due to above symptoms, the patient has been taken to the ER 3 times. Workup has been negative including CT of the abdomen that only showed mild ductal dilatation.  (Unlikely to be clinically significant). I had a long conversation with the patient and the caregiver. B/B incontinence is part of the dementia syndrome, if she has fever chills, dysuria, blood in the stools or in the urine they need to seek medical attention. She has R hip pain and a ill-defined abdominal pain only in the mornings when she gets up. I wonder if  pain is generated by her hip.  No recent falls.  Plan: Check a x-ray of the hip, Tylenol as needed. Palliative care: Was referred October 11, 2022, the husband reports they have visit her and they are planning to provide physical therapy which is a very good idea. Vaccine advice provided RTC 4 months  Time spent today: 40 minutes.  Extensive chart review.  I had a long conversation with the husband explaining the

## 2023-02-11 NOTE — Patient Instructions (Signed)
Angel Green is back to her baseline.  If she develop fever, chills, constipation or complains of major pain: Seek medical attention.  Next visit with me in 3 months    Please schedule it at the front desk

## 2023-02-12 NOTE — Assessment & Plan Note (Signed)
Dementia: Recently seen in the ER after being found unresponsive in the bathroom, she was responsive when EMS arrived. Workup in the ER negative, she is now back to her baseline. Plan: No change, watch for fever or chills, constipation etc. DM: Last A1c 7.6, ambulatory CBGs before meals in the 150s, after meals sometimes in the 200s, recommending to continue working with endocrinology RTC 3 months

## 2023-02-23 DIAGNOSIS — G301 Alzheimer's disease with late onset: Secondary | ICD-10-CM | POA: Diagnosis not present

## 2023-02-23 DIAGNOSIS — F02C0 Dementia in other diseases classified elsewhere, severe, without behavioral disturbance, psychotic disturbance, mood disturbance, and anxiety: Secondary | ICD-10-CM | POA: Diagnosis not present

## 2023-03-07 ENCOUNTER — Telehealth: Payer: Self-pay

## 2023-03-07 ENCOUNTER — Other Ambulatory Visit: Payer: Self-pay

## 2023-03-07 MED ORDER — TRULICITY 1.5 MG/0.5ML ~~LOC~~ SOAJ: SUBCUTANEOUS | 1 refills | Status: DC

## 2023-03-07 NOTE — Telephone Encounter (Signed)
 Patient called needs Trulicity to be sent to [facility] DELIVERY best contact number is [phone].

## 2023-03-07 NOTE — Telephone Encounter (Signed)
 Sent refill into mail order pharmacy for patient

## 2023-03-08 ENCOUNTER — Ambulatory Visit: Payer: Medicare Other | Admitting: Internal Medicine

## 2023-03-14 ENCOUNTER — Other Ambulatory Visit: Payer: Self-pay | Admitting: Internal Medicine

## 2023-03-15 ENCOUNTER — Ambulatory Visit
Admission: RE | Admit: 2023-03-15 | Discharge: 2023-03-15 | Disposition: A | Discharge status: Home / Self Care | Payer: Medicare Other | Source: Ambulatory Visit | Attending: Internal Medicine | Admitting: Internal Medicine

## 2023-03-15 DIAGNOSIS — Z1231 Encounter for screening mammogram for malignant neoplasm of breast: Secondary | ICD-10-CM | POA: Diagnosis not present

## 2023-03-17 ENCOUNTER — Other Ambulatory Visit: Payer: Self-pay | Admitting: Internal Medicine

## 2023-03-17 DIAGNOSIS — R928 Other abnormal and inconclusive findings on diagnostic imaging of breast: Secondary | ICD-10-CM

## 2023-03-26 DIAGNOSIS — F02C0 Dementia in other diseases classified elsewhere, severe, without behavioral disturbance, psychotic disturbance, mood disturbance, and anxiety: Secondary | ICD-10-CM | POA: Diagnosis not present

## 2023-03-26 DIAGNOSIS — G301 Alzheimer's disease with late onset: Secondary | ICD-10-CM | POA: Diagnosis not present

## 2023-03-28 NOTE — Telephone Encounter (Unsigned)
Copied from CRM [id number]. Topic: Clinical - Pink Word Triage >> Mar 28, 2023 11:14 AM Fredrich Romans wrote: Reason for Triage: patient not eating,husband concerned

## 2023-03-31 ENCOUNTER — Ambulatory Visit
Admission: RE | Admit: 2023-03-31 | Discharge: 2023-03-31 | Disposition: A | Discharge status: Home / Self Care | Payer: Medicare Other | Source: Ambulatory Visit | Attending: Internal Medicine | Admitting: Internal Medicine

## 2023-03-31 DIAGNOSIS — N6321 Unspecified lump in the left breast, upper outer quadrant: Secondary | ICD-10-CM | POA: Diagnosis not present

## 2023-03-31 DIAGNOSIS — R928 Other abnormal and inconclusive findings on diagnostic imaging of breast: Secondary | ICD-10-CM

## 2023-04-04 ENCOUNTER — Other Ambulatory Visit: Payer: Self-pay | Admitting: Internal Medicine

## 2023-04-04 DIAGNOSIS — N6320 Unspecified lump in the left breast, unspecified quadrant: Secondary | ICD-10-CM

## 2023-04-23 DIAGNOSIS — G301 Alzheimer's disease with late onset: Secondary | ICD-10-CM | POA: Diagnosis not present

## 2023-04-23 DIAGNOSIS — F02C0 Dementia in other diseases classified elsewhere, severe, without behavioral disturbance, psychotic disturbance, mood disturbance, and anxiety: Secondary | ICD-10-CM | POA: Diagnosis not present

## 2023-05-05 ENCOUNTER — Telehealth: Payer: Self-pay

## 2023-05-05 NOTE — Telephone Encounter (Signed)
 Spouse here for lab visit, during visit request to speak to RN. Spouse is wife's caregiver and stated he needed more sensors ordered from [facility], as he was unsure where they currently are. Order placed on [facility].

## 2023-05-10 ENCOUNTER — Encounter: Payer: Self-pay | Admitting: Endocrinology

## 2023-05-10 ENCOUNTER — Ambulatory Visit (INDEPENDENT_AMBULATORY_CARE_PROVIDER_SITE_OTHER): Payer: Medicare Other | Admitting: Endocrinology

## 2023-05-10 VITALS — BP 136/70 | HR 91 | Resp 20 | Ht 65.0 in | Wt 217.4 lb

## 2023-05-10 DIAGNOSIS — Z794 Long term (current) use of insulin: Secondary | ICD-10-CM | POA: Diagnosis not present

## 2023-05-10 DIAGNOSIS — E1165 Type 2 diabetes mellitus with hyperglycemia: Secondary | ICD-10-CM | POA: Diagnosis not present

## 2023-05-10 LAB — POCT GLYCOSYLATED HEMOGLOBIN (HGB A1C): Hemoglobin A1C: 7.8 % — AB (ref 4.0–5.6)

## 2023-05-10 NOTE — Progress Notes (Signed)
 Outpatient Endocrinology Note [name], MD  05/10/23  Patient's Name: Angel Green    DOB: [birth year]    MRN: [id number]                                                    REASON OF VISIT: Follow up of type 2 diabetes mellitus  PCP: Wanda Plump, MD  HISTORY OF PRESENT ILLNESS:   Angel Green is a 83 y.o. old female with past medical history listed below, is here for follow up  type 2 diabetes mellitus.   Pertinent Diabetes History: He was diagnosed with type 2 diabetes mellitus in 1999.  She has been on insulin therapy from 2010 and Trulicity was started in 2019.    Chronic Diabetes Complications : Retinopathy: no. Last ophthalmology exam was done on 04/2022, reportedly. Nephropathy: CKD, following with nephrology. Peripheral neuropathy:no Coronary artery disease: no Stroke: no  Relevant comorbidities and cardiovascular risk factors: Obesity: yes Body mass index is 36.18 kg/m.  Hypertension: yes Hyperlipidemia. Yes, on a statin.  Current / Home Diabetic regimen includes: Humulin mix 75/25 : 40 units daily in the morning with breakfast. Trulicity 1.5 mg weekly.  Prior diabetic medications: She used to be on Trulicity 4.5 mg weekly was decreased due to weight loss.  Glycemic data:    Forget to bring freestyle libre reader, not able to review glucose data today.  Hypoglycemia: Patient has denied hypoglycemic episodes. Patient has hypoglycemia awareness.  Factors modifying glucose control: 1.  Diabetic diet assessment: 3 meals a day last meal of the day around 5 to 6 PM.  She eats breakfast around 11 AM.  Denies bedtime snack.  2.  Staying active or exercising: No formal exercise.  3.  Medication compliance: compliant all Of the time.  Interval history Patient is accompanied by her husband and caregiver in the clinic today.  No glucose data to review, forget to bring freestyle libre  reader to download data.  Patient's husband will be bringing this afternoon,  will review when available.  Hemoglobin A1c today 7.8%, reasonable.  Patient has been taking Humulin mix 40 units once a day with breakfast only.  Has not been taking evening dose.  Reports no hypoglycemia in the afternoon.  Blood sugar in the morning generally 140 range.  Denies significant hyperglycemia in the early morning.  No other complaints today.  REVIEW OF SYSTEMS As per history of present illness.   PAST MEDICAL HISTORY: Past Medical History:  Diagnosis Date   Allergy    Dust Mites   Ankle pain    chronic   Diabetes mellitus, type 2 (HCC)    Hyperlipidemia    Hypertension    Memory loss    Osteoarthritis    RENAL INSUFFICIENCY, CHRONIC 04/16/2010   Rhinitis    allergic nos    PAST SURGICAL HISTORY: Past Surgical History:  Procedure Laterality Date   CATARACT EXTRACTION     CHOLECYSTECTOMY     dental implants     PARTIAL HYSTERECTOMY     in her 30s    ALLERGIES: Allergies  Allergen Reactions   Dust Mite Mixed Allergen Ext [Mite (D. Farinae)]     FAMILY HISTORY:  Family History  Problem Relation Age of Onset   Healthy Brother    Healthy Son    Healthy Son  Other Mother        unsure of history - "old age"   Other Father        unsure of history - "old age"   Heart attack Neg Hx    Colon cancer Neg Hx    Breast cancer Neg Hx    Stroke Neg Hx     SOCIAL HISTORY: Social History   Socioeconomic History   Marital status: Married    Spouse name: Not on file   Number of children: 2   Years of education: some college   Highest education level: Not on file  Occupational History   Occupation:  retired  Tobacco Use   Smoking status: Never   Smokeless tobacco: Never  Vaping Use   Vaping status: Never Used  Substance and Sexual Activity   Alcohol use: No    Alcohol/week: 0.0 standard drinks of alcohol   Drug use: No   Sexual activity: Yes    Partners: Male  Other Topics Concern   Not on file  Social History Narrative   Husband is a  Optician, dispensing.   Right-handed.   1 cup caffeine daily.   Lives at home with husband.          Social Drivers of Corporate investment banker Strain: Low Risk  (08/17/2021)   Overall Financial Resource Strain (CARDIA)    Difficulty of Paying Living Expenses: Not very hard  Food Insecurity: No Food Insecurity (02/11/2022)   Hunger Vital Sign    Worried About Running Out of Food in the Last Year: Never true    Ran Out of Food in the Last Year: Never true  Transportation Needs: No Transportation Needs (02/11/2022)   PRAPARE - Administrator, Civil Service (Medical): No    Lack of Transportation (Non-Medical): No  Physical Activity: Inactive (05/18/2022)   Exercise Vital Sign    Days of Exercise per Week: 0 days    Minutes of Exercise per Session: 0 min  Stress: Not on file  Social Connections: Not on file    MEDICATIONS:  Current Outpatient Medications  Medication Sig Dispense Refill   amLODipine (NORVASC) 5 MG tablet Take 1 tablet (5 mg total) by mouth daily. 90 tablet 1   atorvastatin (LIPITOR) 10 MG tablet Take 1 tablet (10 mg total) by mouth daily. 90 tablet 1   Blood Glucose Monitoring Suppl (FREESTYLE LITE) DEVI Use to monitor your blood sugars 1 each 0   Continuous Blood Gluc Sensor (FREESTYLE LIBRE 2 SENSOR) MISC 1 Device by Does not apply route every 14 (fourteen) days. 6 each 3   Cyanocobalamin (B-12 PO) Take 1 tablet by mouth daily. Takes 5000 mcg by mouth daily.     donepezil (ARICEPT) 10 MG tablet Take 1 tablet (10 mg total) by mouth at bedtime. 90 tablet 3   Dulaglutide (TRULICITY) 1.5 MG/0.5ML SOAJ Inject weekly 6 mL 1   DULoxetine (CYMBALTA) 20 MG capsule TAKE 1 CAPSULE DAILY 90 capsule 3   EPINEPHrine 0.3 mg/0.3 mL IJ SOAJ injection Inject 0.3 mg into the muscle once. Reported on 06/30/2015     furosemide (LASIX) 20 MG tablet Take 2 tablets (40 mg total) by mouth daily. 180 tablet 1   glucose blood (FREESTYLE LITE) test strip Use to monitor your blood sugars 4  times per day; E11.9 400 each 12   Insulin Lispro Prot & Lispro (HUMALOG 75/25 MIX) (75-25) 100 UNIT/ML Kwikpen INJECT 30 UNITS UNDER THE SKIN DAILY WITH BREAKFAST and 10  UNITS WITH SUPPER. 30 mL 3   Insulin Pen Needle (SURE COMFORT PEN NEEDLES) 31G X 5 MM MISC USE 1 PEN NEEDLE PER DAY 100 each 3   Lancets (FREESTYLE) lancets Use to monitor blood sugars 4 times per day, once before each meal and once at bedtime 400 each 12   loratadine (CLARITIN) 10 MG tablet Take 1 tablet (10 mg total) by mouth daily. 90 tablet 1   memantine (NAMENDA) 10 MG tablet Take 1 tablet (10 mg total) by mouth 2 (two) times daily. 180 tablet 3   Multiple Vitamins-Minerals (CENTRUM SILVER PO) Take 1 tablet by mouth daily.     Omega-3 Fatty Acids (FISH OIL PO) Take 1 capsule by mouth daily in the afternoon.     potassium chloride (KLOR-CON) 10 MEQ tablet Take 2 tablets (20 mEq total) by mouth daily. 180 tablet 1   QUEtiapine (SEROQUEL) 50 MG tablet Take 1 tablet (50 mg total) by mouth at bedtime. 90 tablet 3   No current facility-administered medications for this visit.    PHYSICAL EXAM: Vitals:   05/10/23 1000  BP: 136/70  Pulse: 91  Resp: 20  SpO2: 99%  Weight: 217 lb 6.4 oz (98.6 kg)  Height: 5\' 5"  (1.651 m)   Body mass index is 36.18 kg/m.  Wt Readings from Last 3 Encounters:  05/10/23 217 lb 6.4 oz (98.6 kg)  02/09/23 215 lb 6.4 oz (97.7 kg)  02/02/23 216 lb (98 kg)    General: Well developed, well nourished female in no apparent distress.  HEENT: AT/[place], no external lesions.  Eyes: Conjunctiva clear and no icterus. Neck: Neck supple  Lungs: Respirations not labored Neurologic: Alert, oriented, normal speech Extremities / Skin: Dry.   Psychiatric: Does not appear depressed or anxious  Diabetic Foot Exam - Simple   Simple Foot Form Diabetic Foot exam was performed with the following findings: Yes 05/10/2023 10:31 AM  Visual Inspection See comments: Yes Sensation Testing See comments: Yes Pulse  Check See comments: Yes Comments DP palpable.  B/l Bunions+, no ulcer. Dystrophic nails. Monofilament not reliable, patient not able to express.      LABS Reviewed Lab Results  Component Value Date   HGBA1C 7.8 (A) 05/10/2023   HGBA1C 7.6 (A) 02/09/2023   HGBA1C 7.5 (A) 09/30/2022   Lab Results  Component Value Date   FRUCTOSAMINE 299 (H) 06/26/2020   FRUCTOSAMINE 370 (H) 04/22/2015   Lab Results  Component Value Date   CHOL 157 09/07/2021   HDL 57.30 09/07/2021   LDLCALC 76 09/07/2021   TRIG 123.0 09/07/2021   CHOLHDL 3 09/07/2021   Lab Results  Component Value Date   MICRALBCREAT 3.0 01/11/2023   MICRALBCREAT 0.7 04/09/2022   Lab Results  Component Value Date   CREATININE 1.52 (H) 02/02/2023   Lab Results  Component Value Date   GFR 29.75 (L) 04/12/2022    ASSESSMENT / PLAN  1. Uncontrolled type 2 diabetes mellitus with hyperglycemia, with long-term current use of insulin (HCC)     Diabetes Mellitus type 2, complicated by CKD - Diabetic status / severity: Fair control  Lab Results  Component Value Date   HGBA1C 7.8 (A) 05/10/2023    Not able to review CGM/glucose data today.  Patient husband will be bringing freestyle libre reader to download and review this afternoon.  Will review and adjust diabetes regimen once able to review CGM data.   - Hemoglobin A1c goal : <7.5-8%  Patient has fair control of her diabetes mellitus,  given her age and comorbidities.    - Medications:   I) continue Humalog mix 75/25: 40 units with breakfast for now.  II) continue Trulicity 1.5 mg weekly.  - Home glucose testing: Continue CGM or check blood sugar as needed. - Discussed/ Gave Hypoglycemia treatment plan.  # Consult : not required at this time.   # Annual urine for microalbuminuria/ creatinine ratio, no microalbuminuria currently, following with nephrology.   Last  Lab Results  Component Value Date   MICRALBCREAT 3.0 01/11/2023    # Foot check  nightly.  # Annual dilated diabetic eye exams.   - Diet: Make healthy diabetic food choices   2. Blood pressure  -  BP Readings from Last 1 Encounters:  05/10/23 136/70    - Control is in target.  - No change in current plans.  3. Lipid status / Hyperlipidemia - Last  Lab Results  Component Value Date   LDLCALC 76 09/07/2021   - Continue atorvastatin 10 mg daily.  Managed by primary care provider.  Diagnoses and all orders for this visit:  Uncontrolled type 2 diabetes mellitus with hyperglycemia, with long-term current use of insulin (HCC) -     POCT glycosylated hemoglobin (Hb A1C)    DISPOSITION Follow up in clinic in 3 months suggested.   All questions answered and patient verbalized understanding of the plan.  [place] Trino Higinbotham, MD [facility] [address] [place], [address] [place], [place] [address] Phone # [phone]  At least part of this note was generated using voice recognition software. Inadvertent word errors may have occurred, which were not recognized during the proofreading process.

## 2023-05-12 ENCOUNTER — Telehealth: Payer: Self-pay | Admitting: Endocrinology

## 2023-05-12 NOTE — Addendum Note (Signed)
 Addended by: Nicklos Gaxiola, [place] on: 05/12/2023 04:26 PM   Modules accepted: Level of Service

## 2023-05-12 NOTE — Telephone Encounter (Signed)
 CONTINUOUS GLUCOSE MONITORING SYSTEM (CGMS) INTERPRETATION:               FreeStyle Libre 3 CGM-  Sensor Download (Sensor download was reviewed and summarized below.) Dates: March 6 to May 11, 2023, 14 days Sensor Average: 143  Glucose Management Indicator: 6.7% Glucose Variability: 39.2%   % data captured: 60%   Glycemic Trends:  <54: 6% 54-70: 2% 71-180: 67% 181-250: 22% 251-400: 3%   Impression: -She has mostly acceptable blood sugar with random hyperglycemia at lunchtime with blood sugar of 2 300 range.  Most of the other times overnight, in in the morning and in the evening are acceptable blood sugar.  She has trending down blood sugar overnight and no overnight hyperglycemia.  On March 14 she had persistent hypoglycemia and blood sugar in 40s to 50s range overnight and also had persistent hypoglycemia with blood sugar in 50s on March 6 in the early morning, ?  Possibly sensor issue, patient denies any hypoglycemic symptoms.   Recommendations: - With the trending down blood sugar overnight and concern of hypoglycemia I would like to decrease Humalog mix 75/25 insulin to 35 units daily in the morning only.  Do not need to take the insulin in the evening. - Continue Trulicity 1.5 mg weekly.A - Asked to call our clinic if she develops any hypoglycemia or any hypoglycemic symptoms for example lightheadedness, weakness or sweating.    Nursing: Please notify the percent above recommendation.       [place] Tracie Lindbloom, MD [facility] [address] [place], [address] [place], [place] [address] Phone # [phone]

## 2023-05-12 NOTE — Progress Notes (Addendum)
  CONTINUOUS GLUCOSE MONITORING SYSTEM (CGMS) INTERPRETATION:               FreeStyle Libre 3 CGM-  Sensor Download (Sensor download was reviewed and summarized below.) Dates: March 6 to May 11, 2023, 14 days Sensor Average: 143  Glucose Management Indicator: 6.7% Glucose Variability: 39.2%  % data captured: 60%  Glycemic Trends:  <54: 6% 54-70: 2% 71-180: 67% 181-250: 22% 251-400: 3%  Impression: -She has mostly acceptable blood sugar with random hyperglycemia at lunchtime with blood sugar of 2 300 range.  Most of the other times overnight, in in the morning and in the evening are acceptable blood sugar.  She has trending down blood sugar overnight and no overnight hyperglycemia.  On March 14 she had persistent hypoglycemia and blood sugar in 40s to 50s range overnight and also had persistent hypoglycemia with blood sugar in 50s on March 6 in the early morning, ?  Possibly sensor issue, patient denies any hypoglycemic symptoms.  Recommendations: - With the trending down blood sugar overnight and concern of hypoglycemia I would like to decrease Humalog mix 75/25 insulin to 35 units daily in the morning only.  Do not need to take the insulin in the evening. - Continue Trulicity 1.5 mg weekly.A - Asked to call our clinic if she develops any hypoglycemia or any hypoglycemic symptoms for example lightheadedness, weakness or sweating.  Nursing: Please notify the percent above recommendation.     [place] Mirza Fessel, MD [facility] Endocrinology [facility] [address] [place], [address] [place], [place] [address] Phone # [phone]

## 2023-05-13 ENCOUNTER — Encounter: Payer: Self-pay | Admitting: Endocrinology

## 2023-05-13 ENCOUNTER — Encounter: Payer: Self-pay | Admitting: Internal Medicine

## 2023-05-13 ENCOUNTER — Ambulatory Visit (INDEPENDENT_AMBULATORY_CARE_PROVIDER_SITE_OTHER): Payer: Medicare Other | Admitting: Internal Medicine

## 2023-05-13 ENCOUNTER — Encounter (HOSPITAL_BASED_OUTPATIENT_CLINIC_OR_DEPARTMENT_OTHER): Payer: Self-pay

## 2023-05-13 ENCOUNTER — Ambulatory Visit (HOSPITAL_BASED_OUTPATIENT_CLINIC_OR_DEPARTMENT_OTHER): Admission: RE | Admit: 2023-05-13 | Source: Ambulatory Visit

## 2023-05-13 VITALS — BP 118/64 | HR 83 | Temp 97.8°F | Resp 18 | Ht 65.0 in | Wt 221.1 lb

## 2023-05-13 DIAGNOSIS — F03C18 Unspecified dementia, severe, with other behavioral disturbance: Secondary | ICD-10-CM | POA: Diagnosis not present

## 2023-05-13 DIAGNOSIS — E119 Type 2 diabetes mellitus without complications: Secondary | ICD-10-CM

## 2023-05-13 DIAGNOSIS — Z7689 Persons encountering health services in other specified circumstances: Secondary | ICD-10-CM

## 2023-05-13 DIAGNOSIS — I10 Essential (primary) hypertension: Secondary | ICD-10-CM | POA: Diagnosis not present

## 2023-05-13 DIAGNOSIS — E785 Hyperlipidemia, unspecified: Secondary | ICD-10-CM

## 2023-05-13 DIAGNOSIS — M25512 Pain in left shoulder: Secondary | ICD-10-CM | POA: Diagnosis not present

## 2023-05-13 LAB — LIPID PANEL
Cholesterol: 157 mg/dL (ref 0–200)
HDL: 56.1 mg/dL (ref >39.00)
LDL Cholesterol: 78 mg/dL (ref 0–99)
NonHDL: 100.48
Total CHOL/HDL Ratio: 3
Triglycerides: 110 mg/dL (ref 0.0–149.0)
VLDL: 22 mg/dL (ref 0.0–40.0)

## 2023-05-13 LAB — BASIC METABOLIC PANEL
BUN: 16 mg/dL (ref 6–23)
CO2: 30 meq/L (ref 19–32)
Calcium: 9.8 mg/dL (ref 8.4–10.5)
Chloride: 99 meq/L (ref 96–112)
Creatinine, Ser: 1.61 mg/dL — ABNORMAL HIGH (ref 0.40–1.20)
GFR: 29.08 mL/min — ABNORMAL LOW (ref >60.00)
Glucose, Bld: 267 mg/dL — ABNORMAL HIGH (ref 70–99)
Potassium: 4.1 meq/L (ref 3.5–5.1)
Sodium: 138 meq/L (ref 135–145)

## 2023-05-13 NOTE — Telephone Encounter (Signed)
 Patient spouse/caregiver attempted again, VM left requesting callback.

## 2023-05-13 NOTE — Telephone Encounter (Signed)
 Patient attempted to give medication changes as directed by MD. No VM, unable to leave message will retry at later date/time

## 2023-05-13 NOTE — Patient Instructions (Addendum)
 Vaccines I recommend: Covid booster RSV vaccine   Check the  blood pressure regularly Blood pressure goal:  between 110/65 and  135/85. If it is consistently higher or lower, let me know     GO TO THE LAB : Get the blood work     Please go to the front desk: Arrange a follow-up in 3 to 4 months    STOP BY THE FIRST FLOOR:  get the XR         Per our records you are due for your diabetic eye exam. Please contact your eye doctor to schedule an appointment. Please have them send copies of your office visit notes to [place]. Our fax number is [phone]. If you need a referral to an eye doctor please let us know.

## 2023-05-13 NOTE — Progress Notes (Signed)
 Subjective:    Patient ID: Angel Green, female    DOB: 1940-11-29, 83 y.o.   MRN: [id number]  DOS:  05/13/2023 Type of visit - description: Follow-up, here with her husband and her caregiver  Since LOV, things are stable. The one thing the husband  has noted is left arm pain. Denies any fall. The patient has dementia, unable to get further history.  Review of Systems See above   Past Medical History:  Diagnosis Date   Allergy    Dust Mites   Ankle pain    chronic   Diabetes mellitus, type 2 (HCC)    Hyperlipidemia    Hypertension    Memory loss    Osteoarthritis    RENAL INSUFFICIENCY, CHRONIC 04/16/2010   Rhinitis    allergic nos    Past Surgical History:  Procedure Laterality Date   CATARACT EXTRACTION     CHOLECYSTECTOMY     dental implants     PARTIAL HYSTERECTOMY     in her 30s    Current Outpatient Medications  Medication Instructions   amLODipine (NORVASC) 5 mg, Oral, Daily   atorvastatin (LIPITOR) 10 mg, Oral, Daily   Blood Glucose Monitoring Suppl (FREESTYLE LITE) DEVI Use to monitor your blood sugars   Continuous Blood Gluc Sensor (FREESTYLE LIBRE 2 SENSOR) MISC 1 Device, Does not apply, Every 14 days   Cyanocobalamin (B-12 PO) 1 tablet, Daily   donepezil (ARICEPT) 10 mg, Oral, Daily at bedtime   Dulaglutide (TRULICITY) 1.5 MG/0.5ML SOAJ Inject weekly   DULoxetine (CYMBALTA) 20 mg, Oral, Daily   EPINEPHrine (EPI-PEN) 0.3 mg,  Once   furosemide (LASIX) 40 mg, Oral, Daily   glucose blood (FREESTYLE LITE) test strip Use to monitor your blood sugars 4 times per day; E11.9   Insulin Lispro Prot & Lispro (HUMALOG 75/25 MIX) (75-25) 100 UNIT/ML Kwikpen INJECT 30 UNITS UNDER THE SKIN DAILY WITH BREAKFAST and 10 UNITS WITH SUPPER.   Insulin Pen Needle (SURE COMFORT PEN NEEDLES) 31G X 5 MM MISC USE 1 PEN NEEDLE PER DAY   Lancets (FREESTYLE) lancets Use to monitor blood sugars 4 times per day, once before each meal and once at bedtime   loratadine (CLARITIN)  10 mg, Oral, Daily   memantine (NAMENDA) 10 mg, Oral, 2 times daily   Multiple Vitamins-Minerals (CENTRUM SILVER PO) 1 tablet, Daily   Omega-3 Fatty Acids (FISH OIL PO) 1 capsule, Daily   potassium chloride (KLOR-CON) 10 MEQ tablet 20 mEq, Oral, Daily   QUEtiapine (SEROQUEL) 50 mg, Oral, Daily at bedtime       Objective:   Physical Exam BP 118/64   Pulse 83   Temp 97.8 F (36.6 C) (Oral)   Resp 18   Ht 5\' 5"  (1.651 m)   Wt 221 lb 1 oz (100.3 kg) Comment: wheelchair  SpO2 95%   BMI 36.79 kg/m  General:   Well developed, NAD, BMI noted. HEENT:  Normocephalic . Face symmetric, atraumatic Lungs:  CTA B Normal respiratory effort, no intercostal retractions, no accessory muscle use. Heart: RRR,  no murmur.  Lower extremities: toe nails are long Upper extremities: Shoulder symmetric, no TTP. Right arm: Normal Left arm: Flexion extension of the elbow seems okay.  Reports pain at the deltoid area with passive shoulder ROM. Skin: Not pale. Not jaundice Neurologic:  alert & pleasantly demented, has some difficulty following simple commands Speech normal, gait not tested. Psych--  Behavior appropriate. No anxious or depressed appearing.      Assessment  Assessment   DM  Dr Everardo All-- Porfirio Mylar + Neuropathy: Per foot exam 12-2014 HTN ---change ACE to ARB is 03/2014 Hyperlipidemia CRI  dx 2012  Sees Dr Abel Presto  Morbid obesity DJD, had  chronic ankle pain Allergies -- dust mites , occ uses a inhaler , sees allergist, has shots q weeks started ~ 08-2014 Dementia:  MMSE 7/218: 24, rx Aricept.  B12, RPR, sed rate and TSH normal MMSE 07/2017 :13 worse MRI brain 6/19:  prominent right temporal lobe volume loss.  PLAN DM: Saw endo few days ago, A1c 7.8 HTN: BP today is okay, encouraged to check at home, continue amlodipine, Lasix, potassium.  Check BMP. High cholesterol: On atorvastatin, last LFTs okay, check FLP. CKD: No recent visit with nephrology, checking labs Left arm  pain: Will start by doing a x-ray to be sure there is no fracture, recommend self exercises, the husband reports he will be able to do it however if he notices she is losing ground and getting stiff he will let me know for a formal shoulder PT. Dementia: On Aricept, Namenda, Seroquel, they reports she is not agitated, pleasant.  No change. Nail care: Unable to provide nail care at home, referred to podiatry. RTC 3 to 4 months

## 2023-05-14 ENCOUNTER — Ambulatory Visit (HOSPITAL_BASED_OUTPATIENT_CLINIC_OR_DEPARTMENT_OTHER)
Admission: RE | Admit: 2023-05-14 | Discharge: 2023-05-14 | Disposition: A | Discharge status: Home / Self Care | Source: Ambulatory Visit | Attending: Internal Medicine | Admitting: Internal Medicine

## 2023-05-14 DIAGNOSIS — M25512 Pain in left shoulder: Secondary | ICD-10-CM | POA: Diagnosis not present

## 2023-05-14 DIAGNOSIS — M19012 Primary osteoarthritis, left shoulder: Secondary | ICD-10-CM | POA: Diagnosis not present

## 2023-05-14 NOTE — Assessment & Plan Note (Signed)
 DM: Saw endo few days ago, A1c 7.8 HTN: BP today is okay, encouraged to check at home, continue amlodipine, Lasix, potassium.  Check BMP. High cholesterol: On atorvastatin, last LFTs okay, check FLP. CKD: No recent visit with nephrology, checking labs Left arm pain: Will start by doing a x-ray to be sure there is no fracture, recommend self exercises, the husband reports he will be able to do it however if he notices she is losing ground and getting stiff he will let me know for a formal shoulder PT. Dementia: On Aricept, Namenda, Seroquel, they reports she is not agitated, pleasant.  No change. Nail care: Unable to provide nail care at home, referred to podiatry. RTC 3 to 4 months

## 2023-05-16 ENCOUNTER — Telehealth: Payer: Self-pay | Admitting: Internal Medicine

## 2023-05-16 NOTE — Telephone Encounter (Signed)
 Copied from CRM [id number]. Topic: Clinical - Home Health Verbal Orders >> May 16, 2023  4:30 PM Tiffany S wrote: Caller/Agency: [facility] Number: Phone [phone] Fax [phone] Service Requested: Physical Therapy/Occupational Therapy  Frequency: needs evaluation first  Any new concerns about the patient? No

## 2023-05-16 NOTE — Telephone Encounter (Signed)
 Spoke with spouse/caregiver about medication changes. Spouse has no further questions at this time and aware to call clinic if patient becomes symptomatic.

## 2023-05-17 ENCOUNTER — Other Ambulatory Visit: Payer: Self-pay

## 2023-05-17 DIAGNOSIS — F03C18 Unspecified dementia, severe, with other behavioral disturbance: Secondary | ICD-10-CM

## 2023-05-17 NOTE — Telephone Encounter (Signed)
 Referral placed.

## 2023-05-19 ENCOUNTER — Telehealth: Payer: Self-pay

## 2023-05-19 DIAGNOSIS — F03C18 Unspecified dementia, severe, with other behavioral disturbance: Secondary | ICD-10-CM

## 2023-05-19 NOTE — Telephone Encounter (Signed)
 Copied from CRM [id number]. Topic: Clinical - Home Health Verbal Orders >> May 19, 2023 11:26 AM Saverio Danker wrote: Caller/Agency: [facility] Callback Number: [phone] Service Requested: Occupational Therapy and Physical Therapy Frequency: weekly Any new concerns about the patient? No  received a referral for palliative care and the patient is already a palliative patient  going to refax request

## 2023-05-19 NOTE — Telephone Encounter (Signed)
 Spoke w/ [facility]-tried giving verbal orders for OT/PT- she informed that they needed a referral, informed that we sent one to them on 05/17/23, she stated that was for a palliative care intake- they are unable to accept it. HH referral placed.

## 2023-05-21 DIAGNOSIS — M25512 Pain in left shoulder: Secondary | ICD-10-CM | POA: Diagnosis not present

## 2023-05-21 DIAGNOSIS — M19071 Primary osteoarthritis, right ankle and foot: Secondary | ICD-10-CM | POA: Diagnosis not present

## 2023-05-21 DIAGNOSIS — E7800 Pure hypercholesterolemia, unspecified: Secondary | ICD-10-CM | POA: Diagnosis not present

## 2023-05-21 DIAGNOSIS — E1122 Type 2 diabetes mellitus with diabetic chronic kidney disease: Secondary | ICD-10-CM | POA: Diagnosis not present

## 2023-05-21 DIAGNOSIS — Z6836 Body mass index (BMI) 36.0-36.9, adult: Secondary | ICD-10-CM | POA: Diagnosis not present

## 2023-05-21 DIAGNOSIS — F03C18 Unspecified dementia, severe, with other behavioral disturbance: Secondary | ICD-10-CM | POA: Diagnosis not present

## 2023-05-21 DIAGNOSIS — I129 Hypertensive chronic kidney disease with stage 1 through stage 4 chronic kidney disease, or unspecified chronic kidney disease: Secondary | ICD-10-CM | POA: Diagnosis not present

## 2023-05-21 DIAGNOSIS — E1140 Type 2 diabetes mellitus with diabetic neuropathy, unspecified: Secondary | ICD-10-CM | POA: Diagnosis not present

## 2023-05-21 DIAGNOSIS — N189 Chronic kidney disease, unspecified: Secondary | ICD-10-CM | POA: Diagnosis not present

## 2023-05-23 DIAGNOSIS — I129 Hypertensive chronic kidney disease with stage 1 through stage 4 chronic kidney disease, or unspecified chronic kidney disease: Secondary | ICD-10-CM | POA: Diagnosis not present

## 2023-05-23 DIAGNOSIS — E1122 Type 2 diabetes mellitus with diabetic chronic kidney disease: Secondary | ICD-10-CM | POA: Diagnosis not present

## 2023-05-23 DIAGNOSIS — E1140 Type 2 diabetes mellitus with diabetic neuropathy, unspecified: Secondary | ICD-10-CM | POA: Diagnosis not present

## 2023-05-23 DIAGNOSIS — F03C18 Unspecified dementia, severe, with other behavioral disturbance: Secondary | ICD-10-CM | POA: Diagnosis not present

## 2023-05-23 DIAGNOSIS — N189 Chronic kidney disease, unspecified: Secondary | ICD-10-CM | POA: Diagnosis not present

## 2023-05-24 ENCOUNTER — Telehealth: Payer: Self-pay

## 2023-05-24 DIAGNOSIS — F02C0 Dementia in other diseases classified elsewhere, severe, without behavioral disturbance, psychotic disturbance, mood disturbance, and anxiety: Secondary | ICD-10-CM | POA: Diagnosis not present

## 2023-05-24 DIAGNOSIS — G301 Alzheimer's disease with late onset: Secondary | ICD-10-CM | POA: Diagnosis not present

## 2023-05-24 NOTE — Telephone Encounter (Signed)
 Contacted patient on preferred number listed in notes for scheduled AWV. Husband stated Patient unable to complete visit and he was unable to complete for her.

## 2023-05-26 DIAGNOSIS — I129 Hypertensive chronic kidney disease with stage 1 through stage 4 chronic kidney disease, or unspecified chronic kidney disease: Secondary | ICD-10-CM | POA: Diagnosis not present

## 2023-05-26 DIAGNOSIS — E1122 Type 2 diabetes mellitus with diabetic chronic kidney disease: Secondary | ICD-10-CM | POA: Diagnosis not present

## 2023-05-26 DIAGNOSIS — N189 Chronic kidney disease, unspecified: Secondary | ICD-10-CM | POA: Diagnosis not present

## 2023-05-26 DIAGNOSIS — F03C18 Unspecified dementia, severe, with other behavioral disturbance: Secondary | ICD-10-CM | POA: Diagnosis not present

## 2023-05-26 DIAGNOSIS — E1140 Type 2 diabetes mellitus with diabetic neuropathy, unspecified: Secondary | ICD-10-CM | POA: Diagnosis not present

## 2023-06-03 DIAGNOSIS — N189 Chronic kidney disease, unspecified: Secondary | ICD-10-CM | POA: Diagnosis not present

## 2023-06-03 DIAGNOSIS — I129 Hypertensive chronic kidney disease with stage 1 through stage 4 chronic kidney disease, or unspecified chronic kidney disease: Secondary | ICD-10-CM | POA: Diagnosis not present

## 2023-06-03 DIAGNOSIS — E1122 Type 2 diabetes mellitus with diabetic chronic kidney disease: Secondary | ICD-10-CM | POA: Diagnosis not present

## 2023-06-03 DIAGNOSIS — E1140 Type 2 diabetes mellitus with diabetic neuropathy, unspecified: Secondary | ICD-10-CM | POA: Diagnosis not present

## 2023-06-03 DIAGNOSIS — F03C18 Unspecified dementia, severe, with other behavioral disturbance: Secondary | ICD-10-CM | POA: Diagnosis not present

## 2023-06-06 DIAGNOSIS — F03C18 Unspecified dementia, severe, with other behavioral disturbance: Secondary | ICD-10-CM | POA: Diagnosis not present

## 2023-06-06 DIAGNOSIS — E1122 Type 2 diabetes mellitus with diabetic chronic kidney disease: Secondary | ICD-10-CM | POA: Diagnosis not present

## 2023-06-06 DIAGNOSIS — I129 Hypertensive chronic kidney disease with stage 1 through stage 4 chronic kidney disease, or unspecified chronic kidney disease: Secondary | ICD-10-CM | POA: Diagnosis not present

## 2023-06-06 DIAGNOSIS — N189 Chronic kidney disease, unspecified: Secondary | ICD-10-CM | POA: Diagnosis not present

## 2023-06-06 DIAGNOSIS — E1140 Type 2 diabetes mellitus with diabetic neuropathy, unspecified: Secondary | ICD-10-CM | POA: Diagnosis not present

## 2023-06-11 DIAGNOSIS — N189 Chronic kidney disease, unspecified: Secondary | ICD-10-CM | POA: Diagnosis not present

## 2023-06-11 DIAGNOSIS — E1140 Type 2 diabetes mellitus with diabetic neuropathy, unspecified: Secondary | ICD-10-CM | POA: Diagnosis not present

## 2023-06-11 DIAGNOSIS — I129 Hypertensive chronic kidney disease with stage 1 through stage 4 chronic kidney disease, or unspecified chronic kidney disease: Secondary | ICD-10-CM | POA: Diagnosis not present

## 2023-06-11 DIAGNOSIS — F03C18 Unspecified dementia, severe, with other behavioral disturbance: Secondary | ICD-10-CM | POA: Diagnosis not present

## 2023-06-11 DIAGNOSIS — E1122 Type 2 diabetes mellitus with diabetic chronic kidney disease: Secondary | ICD-10-CM | POA: Diagnosis not present

## 2023-06-13 ENCOUNTER — Ambulatory Visit (INDEPENDENT_AMBULATORY_CARE_PROVIDER_SITE_OTHER): Admitting: Podiatry

## 2023-06-13 ENCOUNTER — Telehealth: Payer: Self-pay

## 2023-06-13 ENCOUNTER — Telehealth: Payer: Self-pay | Admitting: Internal Medicine

## 2023-06-13 ENCOUNTER — Encounter: Payer: Self-pay | Admitting: Podiatry

## 2023-06-13 DIAGNOSIS — M25512 Pain in left shoulder: Secondary | ICD-10-CM | POA: Diagnosis not present

## 2023-06-13 DIAGNOSIS — M79674 Pain in right toe(s): Secondary | ICD-10-CM | POA: Diagnosis not present

## 2023-06-13 DIAGNOSIS — I739 Peripheral vascular disease, unspecified: Secondary | ICD-10-CM

## 2023-06-13 DIAGNOSIS — M19071 Primary osteoarthritis, right ankle and foot: Secondary | ICD-10-CM | POA: Diagnosis not present

## 2023-06-13 DIAGNOSIS — F03C18 Unspecified dementia, severe, with other behavioral disturbance: Secondary | ICD-10-CM | POA: Diagnosis not present

## 2023-06-13 DIAGNOSIS — E7800 Pure hypercholesterolemia, unspecified: Secondary | ICD-10-CM | POA: Diagnosis not present

## 2023-06-13 DIAGNOSIS — Z6836 Body mass index (BMI) 36.0-36.9, adult: Secondary | ICD-10-CM | POA: Diagnosis not present

## 2023-06-13 DIAGNOSIS — E1122 Type 2 diabetes mellitus with diabetic chronic kidney disease: Secondary | ICD-10-CM | POA: Diagnosis not present

## 2023-06-13 DIAGNOSIS — M79675 Pain in left toe(s): Secondary | ICD-10-CM

## 2023-06-13 DIAGNOSIS — I129 Hypertensive chronic kidney disease with stage 1 through stage 4 chronic kidney disease, or unspecified chronic kidney disease: Secondary | ICD-10-CM | POA: Diagnosis not present

## 2023-06-13 DIAGNOSIS — B351 Tinea unguium: Secondary | ICD-10-CM

## 2023-06-13 DIAGNOSIS — Z794 Long term (current) use of insulin: Secondary | ICD-10-CM

## 2023-06-13 DIAGNOSIS — E1142 Type 2 diabetes mellitus with diabetic polyneuropathy: Secondary | ICD-10-CM

## 2023-06-13 DIAGNOSIS — N189 Chronic kidney disease, unspecified: Secondary | ICD-10-CM | POA: Diagnosis not present

## 2023-06-13 DIAGNOSIS — E1140 Type 2 diabetes mellitus with diabetic neuropathy, unspecified: Secondary | ICD-10-CM | POA: Diagnosis not present

## 2023-06-13 NOTE — Telephone Encounter (Signed)
 Called number on file for patient's spouse (caregiver), left VM.

## 2023-06-13 NOTE — Telephone Encounter (Signed)
 Plan of care signed and faxed back to [facility] at [phone]. Form sent for scanning.

## 2023-06-13 NOTE — Telephone Encounter (Addendum)
 Please follow-up with the patient regarding blood sugars after increasing the dose of lispro 75/25 to 45 units in the morning before breakfast few days ago.  Patient had hyperglycemia at that time and dose was increased.

## 2023-06-13 NOTE — Progress Notes (Signed)
  Subjective:  Patient ID: Angel Green, female    DOB: May 29, 1940,   MRN: [id number]  Chief Complaint  Patient presents with   Diabetes    Nail trim     83 y.o. female presents for concern of thickened elongated and painful nails that are difficult to trim. Requesting to have them trimmed today. Relates burning and tingling in their feet. Patient is diabetic and last A1c was  Lab Results  Component Value Date   HGBA1C 7.8 (A) 05/10/2023   .   PCP:  Ezell Hollow, MD    . Denies any other pedal complaints. Denies n/v/f/c.   Past Medical History:  Diagnosis Date   Allergy     Dust Mites   Ankle pain    chronic   Diabetes mellitus, type 2 (HCC)    Hyperlipidemia    Hypertension    Memory loss    Osteoarthritis    RENAL INSUFFICIENCY, CHRONIC 04/16/2010   Rhinitis    allergic nos    Objective:  Physical Exam: Vascular: DP/PT pulses 2/4 bilateral. CFT <3 seconds. Absent hair growth on digits. Edema noted to bilateral lower extremities. Xerosis noted bilaterally.  Skin. No lacerations or abrasions bilateral feet. Nails 1-5 bilateral  are thickened discolored and elongated with subungual debris.  Musculoskeletal: MMT 5/5 bilateral lower extremities in DF, PF, Inversion and Eversion. Deceased ROM in DF of ankle joint.  Neurological: Sensation intact to light touch. Protective sensation diminished bilateral.    Assessment:   1. Pain due to onychomycosis of toenails of both feet   2. Type 2 diabetes mellitus with peripheral neuropathy (HCC)   3. PAD (peripheral artery disease) (HCC)      Plan:  Patient was evaluated and treated and all questions answered. -Discussed and educated patient on diabetic foot care, especially with  regards to the vascular, neurological and musculoskeletal systems.  -Stressed the importance of good glycemic control and the detriment of not  controlling glucose levels in relation to the foot. -Discussed supportive shoes at all times and checking  feet regularly.  -Mechanically debrided all nails 1-5 bilateral using sterile nail nipper and filed with dremel without incident  -Answered all patient questions -Patient to return  in 3 months for at risk foot care -Patient advised to call the office if any problems or questions arise in the meantime.   Jennefer Moats, DPM

## 2023-06-13 NOTE — Telephone Encounter (Signed)
 Late entry: Called from the nursing home (by [facility]) at around 2:30 PM on 06/11/2023 that patient's blood sugars have been high in the last week, with an average of 231.  In the morning of the call, blood sugar spiked to 331 after breakfast.  Before breakfast, CBG was 200.  Upon questioning, she did not receive her insulin  before breakfast, but after. Patient is on lispro 75/25, 35 units before breakfast.  It was mentioned that this dose was decreased from 60 units recently.  The patient is apparently taking this only in the morning.  I advised them to always give the insulin  approximately 15 minutes before the meal and to increase the morning dose to 45 units before breakfast.  I cannot make recommendations about possibly taking it twice a day since they could not offer more information about blood sugars at night.

## 2023-06-14 ENCOUNTER — Telehealth: Payer: Self-pay

## 2023-06-14 MED ORDER — INSULIN LISPRO PROT & LISPRO (75-25 MIX) 100 UNIT/ML KWIKPEN: PEN_INJECTOR | SUBCUTANEOUS | 3 refills | Status: DC

## 2023-06-14 NOTE — Telephone Encounter (Signed)
 Would you like me to forward verbal orders to endo for dm management?

## 2023-06-14 NOTE — Addendum Note (Signed)
 Addended by: Tara Fanti on: 06/14/2023 10:31 AM   Modules accepted: Orders

## 2023-06-14 NOTE — Telephone Encounter (Signed)
 Please contact endocrinology regarding blood sugars

## 2023-06-14 NOTE — Telephone Encounter (Signed)
 Spoke w/ [place]- verbal orders given. Informed to call [facility] if they are having blood sugars issues, [place] informed that they do have endos number and have reached out to them as well.

## 2023-06-14 NOTE — Telephone Encounter (Signed)
 Spoke with spouse/caregiver who stated the dosage was never increased to 45 units patient was still receiving 35 units. Blood glucose levels averaging in 250s. Per MD increase Lispro to 40 units daily before breakfast. No further questions at this time.

## 2023-06-14 NOTE — Telephone Encounter (Signed)
 Copied from CRM [id number]. Topic: Clinical - Home Health Verbal Orders >> Jun 14, 2023 10:33 AM Earnestine Goes B wrote: Caller/Agency: brittney from Paediatric nurse  Service Requested: Skilled Nursing Frequency:  1 week 5 Any new concerns about the patient? Yes called to advises pt  blood sugar was elevated will see pt for  diabetes.

## 2023-06-16 ENCOUNTER — Ambulatory Visit: Payer: Self-pay

## 2023-06-16 DIAGNOSIS — N189 Chronic kidney disease, unspecified: Secondary | ICD-10-CM | POA: Diagnosis not present

## 2023-06-16 DIAGNOSIS — I129 Hypertensive chronic kidney disease with stage 1 through stage 4 chronic kidney disease, or unspecified chronic kidney disease: Secondary | ICD-10-CM | POA: Diagnosis not present

## 2023-06-16 DIAGNOSIS — F03C18 Unspecified dementia, severe, with other behavioral disturbance: Secondary | ICD-10-CM | POA: Diagnosis not present

## 2023-06-16 DIAGNOSIS — E1122 Type 2 diabetes mellitus with diabetic chronic kidney disease: Secondary | ICD-10-CM | POA: Diagnosis not present

## 2023-06-16 DIAGNOSIS — E1140 Type 2 diabetes mellitus with diabetic neuropathy, unspecified: Secondary | ICD-10-CM | POA: Diagnosis not present

## 2023-06-16 NOTE — Telephone Encounter (Signed)
 Copied from CRM [id number]. Topic: Clinical - Red Word Triage >> Jun 16, 2023  2:57 PM Allyne Areola wrote: Red Word that prompted transfer to Nurse Triage: Patient's husband Angel Green is calling because patient is not taking her medication as prescribed she's hiding them and is becoming verbally abusive. They had a nurse come to the house to assist but it's not helping at all.   Chief Complaint: Change in behavior Symptoms: verbally abusive, not taking medications Frequency: the last 2-3 days it has become more apparent Pertinent Negatives: Patient denies any known fever, any obvious complaints per husband Disposition: [] ED /[] Urgent Care (no appt availability in office) / [] Appointment(In office/virtual)/ []  [facility]/ [] Home Care/ [] Refused Recommended Disposition /[] [facility]/ []  Follow-up with PCP Additional Notes: Patient's husband, Angel Green, called and advised that the patient is having a change in behavior in the past 2-3 days.  He states that she is normally confused at her baseline due to her dementia but she is becoming verbally abusive, not taking her medications. Patient takes medication at night but in the morning it's difficult. Home Health Care personnel comes as well. Home Health Caregiver reported seeing tablets in the patient's stool before. Physical Therapist was just at the home. Patient would not cooperate with the physical therapist and has started becoming verbally abusive. Physical Therapist was suspecting a possible urinary tract infection--patient's husband is unaware of any urinary symptoms. Patient does have a history of dementia but this behavior is worse in the past few days. Husband states that her blood sugar level has been high recently. He states it is 255 while on the phone during Triage with this RN. Appointment was made for tomorrow 06/17/2023 at 1 pm at patient's PCP office with Angel Frock PA. Patient's husband was advised that if anything  worsens to take the patient to the Emergency Room. He verbalized understanding of this. With disposition being to Call PCP within 24 hours, this RN called the CAL and provider Angel Frock PA recommended that the patient goes to the Emergency Room for further evaluation. This RN advised the patient's husband of this--that with her change in behavior/symptoms/high blood sugar that it is recommended that she goes to the Emergency Room. Patient's husband replied by saying that taking her to the Emergency Room will take all day. Unsure if patient's husband will take her to the Emergency Room at this time.   Reason for Disposition  New or worsening agitation or behavior problem (e.g., change from patient's normal pattern)  Protocols used: Dementia Symptoms and Questions-A-AH

## 2023-06-16 NOTE — Telephone Encounter (Signed)
 This RN called the CAL back to advise them that the patient's husband was advised he patient needed to go to the Emergency Room and he stated that it would take all day at the Emergency Room so patient's husband did not verify that he would be taking the patient to the Emergency Room so it unclear if she will be going to the ER at this time.

## 2023-06-17 ENCOUNTER — Ambulatory Visit: Admitting: Physician Assistant

## 2023-06-20 DIAGNOSIS — E7800 Pure hypercholesterolemia, unspecified: Secondary | ICD-10-CM | POA: Diagnosis not present

## 2023-06-20 DIAGNOSIS — E1122 Type 2 diabetes mellitus with diabetic chronic kidney disease: Secondary | ICD-10-CM | POA: Diagnosis not present

## 2023-06-20 DIAGNOSIS — F03C18 Unspecified dementia, severe, with other behavioral disturbance: Secondary | ICD-10-CM | POA: Diagnosis not present

## 2023-06-20 DIAGNOSIS — E1140 Type 2 diabetes mellitus with diabetic neuropathy, unspecified: Secondary | ICD-10-CM | POA: Diagnosis not present

## 2023-06-20 DIAGNOSIS — Z6836 Body mass index (BMI) 36.0-36.9, adult: Secondary | ICD-10-CM | POA: Diagnosis not present

## 2023-06-20 DIAGNOSIS — M19071 Primary osteoarthritis, right ankle and foot: Secondary | ICD-10-CM | POA: Diagnosis not present

## 2023-06-20 DIAGNOSIS — M25512 Pain in left shoulder: Secondary | ICD-10-CM | POA: Diagnosis not present

## 2023-06-20 DIAGNOSIS — I129 Hypertensive chronic kidney disease with stage 1 through stage 4 chronic kidney disease, or unspecified chronic kidney disease: Secondary | ICD-10-CM | POA: Diagnosis not present

## 2023-06-20 DIAGNOSIS — N189 Chronic kidney disease, unspecified: Secondary | ICD-10-CM | POA: Diagnosis not present

## 2023-06-23 DIAGNOSIS — G301 Alzheimer's disease with late onset: Secondary | ICD-10-CM | POA: Diagnosis not present

## 2023-06-23 DIAGNOSIS — F02C0 Dementia in other diseases classified elsewhere, severe, without behavioral disturbance, psychotic disturbance, mood disturbance, and anxiety: Secondary | ICD-10-CM | POA: Diagnosis not present

## 2023-06-25 DIAGNOSIS — N189 Chronic kidney disease, unspecified: Secondary | ICD-10-CM | POA: Diagnosis not present

## 2023-06-25 DIAGNOSIS — E1122 Type 2 diabetes mellitus with diabetic chronic kidney disease: Secondary | ICD-10-CM | POA: Diagnosis not present

## 2023-06-25 DIAGNOSIS — I129 Hypertensive chronic kidney disease with stage 1 through stage 4 chronic kidney disease, or unspecified chronic kidney disease: Secondary | ICD-10-CM | POA: Diagnosis not present

## 2023-06-25 DIAGNOSIS — F03C18 Unspecified dementia, severe, with other behavioral disturbance: Secondary | ICD-10-CM | POA: Diagnosis not present

## 2023-06-25 DIAGNOSIS — E1140 Type 2 diabetes mellitus with diabetic neuropathy, unspecified: Secondary | ICD-10-CM | POA: Diagnosis not present

## 2023-06-29 DIAGNOSIS — I129 Hypertensive chronic kidney disease with stage 1 through stage 4 chronic kidney disease, or unspecified chronic kidney disease: Secondary | ICD-10-CM | POA: Diagnosis not present

## 2023-06-29 DIAGNOSIS — E1122 Type 2 diabetes mellitus with diabetic chronic kidney disease: Secondary | ICD-10-CM | POA: Diagnosis not present

## 2023-06-29 DIAGNOSIS — E1140 Type 2 diabetes mellitus with diabetic neuropathy, unspecified: Secondary | ICD-10-CM | POA: Diagnosis not present

## 2023-06-29 DIAGNOSIS — N189 Chronic kidney disease, unspecified: Secondary | ICD-10-CM | POA: Diagnosis not present

## 2023-06-29 DIAGNOSIS — F03C18 Unspecified dementia, severe, with other behavioral disturbance: Secondary | ICD-10-CM | POA: Diagnosis not present

## 2023-07-03 ENCOUNTER — Other Ambulatory Visit: Payer: Self-pay | Admitting: Internal Medicine

## 2023-07-08 DIAGNOSIS — E1140 Type 2 diabetes mellitus with diabetic neuropathy, unspecified: Secondary | ICD-10-CM | POA: Diagnosis not present

## 2023-07-08 DIAGNOSIS — N189 Chronic kidney disease, unspecified: Secondary | ICD-10-CM | POA: Diagnosis not present

## 2023-07-08 DIAGNOSIS — E1122 Type 2 diabetes mellitus with diabetic chronic kidney disease: Secondary | ICD-10-CM | POA: Diagnosis not present

## 2023-07-08 DIAGNOSIS — F03C18 Unspecified dementia, severe, with other behavioral disturbance: Secondary | ICD-10-CM | POA: Diagnosis not present

## 2023-07-08 DIAGNOSIS — I129 Hypertensive chronic kidney disease with stage 1 through stage 4 chronic kidney disease, or unspecified chronic kidney disease: Secondary | ICD-10-CM | POA: Diagnosis not present

## 2023-07-09 DIAGNOSIS — E1140 Type 2 diabetes mellitus with diabetic neuropathy, unspecified: Secondary | ICD-10-CM | POA: Diagnosis not present

## 2023-07-09 DIAGNOSIS — F03C18 Unspecified dementia, severe, with other behavioral disturbance: Secondary | ICD-10-CM | POA: Diagnosis not present

## 2023-07-09 DIAGNOSIS — N189 Chronic kidney disease, unspecified: Secondary | ICD-10-CM | POA: Diagnosis not present

## 2023-07-09 DIAGNOSIS — I129 Hypertensive chronic kidney disease with stage 1 through stage 4 chronic kidney disease, or unspecified chronic kidney disease: Secondary | ICD-10-CM | POA: Diagnosis not present

## 2023-07-09 DIAGNOSIS — E1122 Type 2 diabetes mellitus with diabetic chronic kidney disease: Secondary | ICD-10-CM | POA: Diagnosis not present

## 2023-07-16 ENCOUNTER — Other Ambulatory Visit: Payer: Self-pay | Admitting: Internal Medicine

## 2023-07-24 DIAGNOSIS — F02C0 Dementia in other diseases classified elsewhere, severe, without behavioral disturbance, psychotic disturbance, mood disturbance, and anxiety: Secondary | ICD-10-CM | POA: Diagnosis not present

## 2023-07-24 DIAGNOSIS — G301 Alzheimer's disease with late onset: Secondary | ICD-10-CM | POA: Diagnosis not present

## 2023-08-12 ENCOUNTER — Ambulatory Visit: Admitting: Internal Medicine

## 2023-08-12 ENCOUNTER — Ambulatory Visit: Admitting: Family

## 2023-08-12 ENCOUNTER — Other Ambulatory Visit: Payer: Self-pay | Admitting: Internal Medicine

## 2023-08-12 DIAGNOSIS — E876 Hypokalemia: Secondary | ICD-10-CM

## 2023-08-15 ENCOUNTER — Ambulatory Visit: Admitting: Endocrinology

## 2023-08-19 ENCOUNTER — Ambulatory Visit (INDEPENDENT_AMBULATORY_CARE_PROVIDER_SITE_OTHER): Admitting: Internal Medicine

## 2023-08-19 ENCOUNTER — Encounter: Payer: Self-pay | Admitting: Internal Medicine

## 2023-08-19 VITALS — BP 116/68 | HR 88 | Temp 98.2°F | Resp 18 | Ht 65.0 in | Wt 220.0 lb

## 2023-08-19 DIAGNOSIS — G301 Alzheimer's disease with late onset: Secondary | ICD-10-CM

## 2023-08-19 DIAGNOSIS — E119 Type 2 diabetes mellitus without complications: Secondary | ICD-10-CM

## 2023-08-19 DIAGNOSIS — M159 Polyosteoarthritis, unspecified: Secondary | ICD-10-CM

## 2023-08-19 DIAGNOSIS — F02C0 Dementia in other diseases classified elsewhere, severe, without behavioral disturbance, psychotic disturbance, mood disturbance, and anxiety: Secondary | ICD-10-CM

## 2023-08-19 MED ORDER — DONEPEZIL HCL 10 MG PO TABS: 10.0000 mg | ORAL_TABLET | Freq: Every day | ORAL | 1 refills | Status: DC

## 2023-08-19 NOTE — Patient Instructions (Signed)
 Tylenol   500 mg OTC 2 tabs a day every 8 hours as needed for pain  Continue checking her blood pressure regularly Blood pressure goal:  between 110/65 and  135/85. If it is consistently higher or lower, let me know   Next office visit for a checkup in 4 months Please make an appointment before you leave today

## 2023-08-19 NOTE — Progress Notes (Unsigned)
 Subjective:    Patient ID: Angel Green, female    DOB: 16-Jun-1940, 83 y.o.   MRN: [id number]  DOS:  08/19/2023 Type of visit - description: Routine follow-up.  Here with her husband and caregiver  Since LOV, things are going well. No recent falls. The patient has not complained of any LUTS. She does complain of pain when the transfer her from the bed to a chair etc. They have not noticed any fever chills or headaches. She has not complained of any particularly painful joint and the family has not seen any joint swelling.  Review of Systems See above   Past Medical History:  Diagnosis Date   Allergy     Dust Mites   Ankle pain    chronic   Diabetes mellitus, type 2 (HCC)    Hyperlipidemia    Hypertension    Memory loss    Osteoarthritis    RENAL INSUFFICIENCY, CHRONIC 04/16/2010   Rhinitis    allergic nos    Past Surgical History:  Procedure Laterality Date   CATARACT EXTRACTION     CHOLECYSTECTOMY     dental implants     PARTIAL HYSTERECTOMY     in her 30s    Current Outpatient Medications  Medication Instructions   amLODipine  (NORVASC ) 5 mg, Oral, Daily   atorvastatin  (LIPITOR) 10 mg, Oral, Daily   Blood Glucose Monitoring Suppl (FREESTYLE LITE) DEVI Use to monitor your blood sugars   Continuous Blood Gluc Sensor (FREESTYLE LIBRE 2 SENSOR) MISC 1 Device, Does not apply, Every 14 days   Cyanocobalamin  (B-12 PO) 1 tablet, Daily   donepezil  (ARICEPT ) 10 mg, Oral, Daily at bedtime   Dulaglutide  (TRULICITY ) 1.5 MG/0.5ML SOAJ Inject weekly   DULoxetine  (CYMBALTA ) 20 mg, Oral, Daily   EPINEPHrine (EPI-PEN) 0.3 mg,  Once   furosemide  (LASIX ) 40 mg, Oral, Daily   glucose blood (FREESTYLE LITE) test strip Use to monitor your blood sugars 4 times per day; E11.9   Insulin  Lispro Prot & Lispro (HUMALOG  75/25 MIX) (75-25) 100 UNIT/ML Kwikpen INJECT 40 UNITS UNDER THE SKIN DAILY BEFORE BREAKFAST   Insulin  Pen Needle (SURE COMFORT PEN NEEDLES) 31G X 5 MM MISC USE 1 PEN  NEEDLE PER DAY   Lancets (FREESTYLE) lancets Use to monitor blood sugars 4 times per day, once before each meal and once at bedtime   loratadine  (CLARITIN ) 10 mg, Oral, Daily   memantine  (NAMENDA ) 10 mg, Oral, 2 times daily   Multiple Vitamins-Minerals (CENTRUM SILVER PO) 1 tablet, Daily   Omega-3 Fatty Acids (FISH OIL PO) 1 capsule, Daily   potassium chloride  (KLOR-CON ) 10 MEQ tablet 20 mEq, Oral, Daily   QUEtiapine  (SEROQUEL ) 50 mg, Oral, Daily at bedtime       Objective:   Physical Exam BP 116/68   Pulse 88   Temp 98.2 F (36.8 C) (Oral)   Resp 18   Ht 5' 5 (1.651 m)   Wt 220 lb (99.8 kg) Comment: per husband  SpO2 96%   BMI 36.61 kg/m  General:   Well developed, NAD, BMI noted. HEENT:  Normocephalic . Face symmetric, atraumatic Lungs:  CTA B Normal respiratory effort, no intercostal retractions, no accessory muscle use. Heart: RRR,  no murmur.  Lower extremities: no pretibial edema bilaterally  Skin: Not pale. Not jaundice Neurologic:  alert & pleasantly demented.  Follows commands Speech normal, gait not tested Psych--   behavior appropriate. No anxious or depressed appearing.      Assessment     Assessment  DM  Dr kassie-- [name] + Neuropathy: Per foot exam 12-2014 HTN ---change ACE to ARB is 03/2014 Hyperlipidemia CRI  dx 2012  Sees Dr Alica  Morbid obesity DJD, had  chronic ankle pain Allergies -- dust mites , occ uses a inhaler , sees allergist, has shots q weeks started ~ 08-2014 Dementia:  MMSE 7/218: 24, rx Aricept .  B12, RPR, sed rate and TSH normal MMSE 07/2017 :13 worse MRI brain 6/19:  prominent right temporal lobe volume loss.  PLAN DM: Per Endo.  Occasionally high blood sugar mostly depending on diet.  Recommend appropriate diabetic diet with low carbohydrates and reach out to Endo if needed. HTN: BP today satisfactory, current medications include amlodipine , Lasix , potassium. High cholesterol: On atorvastatin , last LDL 78,  satisfactory. Dementia: On Aricept , Namenda , Seroquel .  At baseline. MSK pain: Complaining of pain whenever she is transfer or mobilize.  No particular joints seem to be more affected.  Recommend preemptive Tylenol  before they bathe her or if she is to move more than usual. Social: Lives with her husband. They also get private help, Monday to Saturday, 10 AM to 8 PM (helpers name: Almarie). [facility] follows the patient and they check on her from time to time. RTC 4 months

## 2023-08-20 NOTE — Assessment & Plan Note (Signed)
 DM: Per Endo.  Occasionally high blood sugar mostly depending on diet.  Recommend appropriate diabetic diet with low carbohydrates and reach out to Endo if needed. HTN: BP today satisfactory, current medications include amlodipine , Lasix , potassium. High cholesterol: On atorvastatin , last LDL 78, satisfactory. Dementia: On Aricept , Namenda , Seroquel .  At baseline. MSK pain: Complaining of pain whenever she is transfer or mobilize.  No particular joints seem to be more affected.  Recommend preemptive Tylenol  before they bathe her or if she is to move more than usual. Social: Lives with her husband. They also get private help, Monday to Saturday, 10 AM to 8 PM (helpers name: Almarie). [facility] follows the patient and they check on her from time to time. RTC 4 months

## 2023-08-22 ENCOUNTER — Other Ambulatory Visit: Payer: Self-pay

## 2023-08-22 DIAGNOSIS — E1165 Type 2 diabetes mellitus with hyperglycemia: Secondary | ICD-10-CM

## 2023-08-22 MED ORDER — TRULICITY 1.5 MG/0.5ML ~~LOC~~ SOAJ: SUBCUTANEOUS | 1 refills | Status: DC

## 2023-08-23 DIAGNOSIS — F02B0 Dementia in other diseases classified elsewhere, moderate, without behavioral disturbance, psychotic disturbance, mood disturbance, and anxiety: Secondary | ICD-10-CM | POA: Diagnosis not present

## 2023-08-23 DIAGNOSIS — G301 Alzheimer's disease with late onset: Secondary | ICD-10-CM | POA: Diagnosis not present

## 2023-08-27 ENCOUNTER — Other Ambulatory Visit: Payer: Self-pay | Admitting: Internal Medicine

## 2023-08-29 ENCOUNTER — Other Ambulatory Visit: Payer: Self-pay | Admitting: Internal Medicine

## 2023-08-29 DIAGNOSIS — E876 Hypokalemia: Secondary | ICD-10-CM

## 2023-08-29 MED ORDER — POTASSIUM CHLORIDE ER 10 MEQ PO TBCR: 20.0000 meq | EXTENDED_RELEASE_TABLET | Freq: Every day | ORAL | 1 refills | Status: DC

## 2023-08-29 NOTE — Telephone Encounter (Signed)
 Copied from CRM [id number]. Topic: Clinical - Medication Refill >> Aug 29, 2023  8:30 AM Berwyn MATSU wrote: Medication: potassium chloride  (KLOR-CON ) 10 MEQ tablet; [facility] script is out of stock and is requesting patient to redirect medication.   Has the patient contacted their pharmacy? Yes (Agent: If no, request that the patient contact the pharmacy for the refill. If patient does not wish to contact the pharmacy document the reason why and proceed with request.) (Agent: If yes, when and what did the pharmacy advise?)  This is the patient's preferred pharmacy:   [id number] [address], [place] - [address] [place] [address] Phone: [phone] Fax: [phone]  Is this the correct pharmacy for this prescription? Yes If no, delete pharmacy and type the correct one.   Has the prescription been filled recently? Yes  Is the patient out of the medication? Yes  Has the patient been seen for an appointment in the last year OR does the patient have an upcoming appointment? No   Can we respond through MyChart? Yes  Agent: Please be advised that Rx refills may take up to 3 business days. We ask that you follow-up with your pharmacy.

## 2023-09-12 ENCOUNTER — Encounter: Payer: Self-pay | Admitting: Podiatry

## 2023-09-12 ENCOUNTER — Ambulatory Visit (INDEPENDENT_AMBULATORY_CARE_PROVIDER_SITE_OTHER): Admitting: Podiatry

## 2023-09-12 DIAGNOSIS — I739 Peripheral vascular disease, unspecified: Secondary | ICD-10-CM | POA: Diagnosis not present

## 2023-09-12 DIAGNOSIS — E1142 Type 2 diabetes mellitus with diabetic polyneuropathy: Secondary | ICD-10-CM | POA: Diagnosis not present

## 2023-09-12 DIAGNOSIS — B351 Tinea unguium: Secondary | ICD-10-CM

## 2023-09-12 DIAGNOSIS — M79675 Pain in left toe(s): Secondary | ICD-10-CM | POA: Diagnosis not present

## 2023-09-12 DIAGNOSIS — M79674 Pain in right toe(s): Secondary | ICD-10-CM

## 2023-09-12 NOTE — Progress Notes (Signed)
  Subjective:  Patient ID: Angel Green, female    DOB: 10-11-1940,   MRN: [id number]  Chief Complaint  Patient presents with   Diabetes    Trim her toenails. Saw Dr. Iraq Thapa - 05/24/2023; A1c - 7.8    83 y.o. female presents for concern of thickened elongated and painful nails that are difficult to trim. Requesting to have them trimmed today. Relates burning and tingling in their feet. Patient is diabetic and last A1c was  Lab Results  Component Value Date   HGBA1C 7.8 (A) 05/10/2023   .   PCP:  Amon Aloysius BRAVO, MD    . Denies any other pedal complaints. Denies n/v/f/c.   Past Medical History:  Diagnosis Date   Allergy     Dust Mites   Ankle pain    chronic   Diabetes mellitus, type 2 (HCC)    Hyperlipidemia    Hypertension    Memory loss    Osteoarthritis    RENAL INSUFFICIENCY, CHRONIC 04/16/2010   Rhinitis    allergic nos    Objective:  Physical Exam: Vascular: DP/PT pulses 2/4 bilateral. CFT <3 seconds. Absent hair growth on digits. Edema noted to bilateral lower extremities. Xerosis noted bilaterally.  Skin. No lacerations or abrasions bilateral feet. Nails 1-5 bilateral  are thickened discolored and elongated with subungual debris.  Musculoskeletal: MMT 5/5 bilateral lower extremities in DF, PF, Inversion and Eversion. Deceased ROM in DF of ankle joint.  Neurological: Sensation intact to light touch. Protective sensation diminished bilateral.    Assessment:   1. Pain due to onychomycosis of toenails of both feet   2. Type 2 diabetes mellitus with peripheral neuropathy (HCC)   3. PAD (peripheral artery disease) (HCC)      Plan:  Patient was evaluated and treated and all questions answered. -Discussed and educated patient on diabetic foot care, especially with  regards to the vascular, neurological and musculoskeletal systems.  -Stressed the importance of good glycemic control and the detriment of not  controlling glucose levels in relation to the  foot. -Discussed supportive shoes at all times and checking feet regularly.  -Mechanically debrided all nails 1-5 bilateral using sterile nail nipper and filed with dremel without incident  -Answered all patient questions -Patient to return  in 3 months for at risk foot care -Patient advised to call the office if any problems or questions arise in the meantime.   Asberry Failing, DPM

## 2023-09-13 ENCOUNTER — Other Ambulatory Visit: Payer: Self-pay

## 2023-09-13 ENCOUNTER — Other Ambulatory Visit: Payer: Self-pay | Admitting: Internal Medicine

## 2023-09-14 DIAGNOSIS — D631 Anemia in chronic kidney disease: Secondary | ICD-10-CM | POA: Diagnosis not present

## 2023-09-14 DIAGNOSIS — N2581 Secondary hyperparathyroidism of renal origin: Secondary | ICD-10-CM | POA: Diagnosis not present

## 2023-09-14 DIAGNOSIS — E785 Hyperlipidemia, unspecified: Secondary | ICD-10-CM | POA: Diagnosis not present

## 2023-09-14 DIAGNOSIS — N189 Chronic kidney disease, unspecified: Secondary | ICD-10-CM | POA: Diagnosis not present

## 2023-09-14 DIAGNOSIS — F0390 Unspecified dementia without behavioral disturbance: Secondary | ICD-10-CM | POA: Diagnosis not present

## 2023-09-14 DIAGNOSIS — N1831 Chronic kidney disease, stage 3a: Secondary | ICD-10-CM | POA: Diagnosis not present

## 2023-09-14 DIAGNOSIS — E1122 Type 2 diabetes mellitus with diabetic chronic kidney disease: Secondary | ICD-10-CM | POA: Diagnosis not present

## 2023-09-14 DIAGNOSIS — Z6841 Body Mass Index (BMI) 40.0 and over, adult: Secondary | ICD-10-CM | POA: Diagnosis not present

## 2023-09-14 DIAGNOSIS — I129 Hypertensive chronic kidney disease with stage 1 through stage 4 chronic kidney disease, or unspecified chronic kidney disease: Secondary | ICD-10-CM | POA: Diagnosis not present

## 2023-09-19 ENCOUNTER — Ambulatory Visit (INDEPENDENT_AMBULATORY_CARE_PROVIDER_SITE_OTHER): Admitting: Family Medicine

## 2023-09-19 ENCOUNTER — Encounter: Payer: Self-pay | Admitting: Family Medicine

## 2023-09-19 ENCOUNTER — Ambulatory Visit: Payer: Self-pay

## 2023-09-19 VITALS — BP 112/70 | HR 94 | Temp 98.0°F | Resp 16 | Ht 65.0 in | Wt 220.0 lb

## 2023-09-19 DIAGNOSIS — R4182 Altered mental status, unspecified: Secondary | ICD-10-CM

## 2023-09-19 DIAGNOSIS — R3989 Other symptoms and signs involving the genitourinary system: Secondary | ICD-10-CM | POA: Diagnosis not present

## 2023-09-19 DIAGNOSIS — F03C18 Unspecified dementia, severe, with other behavioral disturbance: Secondary | ICD-10-CM | POA: Diagnosis not present

## 2023-09-19 MED ORDER — CEFTRIAXONE SODIUM 1 G IJ SOLR
1.0000 g | Freq: Once | INTRAMUSCULAR | Status: AC
  Administered 2023-09-19: 1 g via INTRAMUSCULAR

## 2023-09-19 MED ORDER — CEPHALEXIN 500 MG PO CAPS: 500.0000 mg | ORAL_CAPSULE | Freq: Three times a day (TID) | ORAL | 0 refills | Status: AC

## 2023-09-19 NOTE — Patient Instructions (Addendum)
 Give us  2-3 business days to get the results of your labs back.   Try to keep her hydrated.   If things worsen, seek immediate care.  Let us  know if you need anything.

## 2023-09-19 NOTE — Telephone Encounter (Addendum)
 Copied from CRM [id number]. Topic: Clinical - Medical Advice >> Sep 19, 2023  8:35 AM Larissa RAMAN wrote: Reason for CRM: : Patient's spouse, Aalyssa Elderkin, states she is refusing to take her medication and eat. Requesting a callback from nurse/provider.    ----------------------------------------------------------------------- From previous Reason for Contact - Other: Reason for CRM

## 2023-09-19 NOTE — Progress Notes (Signed)
 Chief Complaint  Patient presents with   Dementia    Dementia    Subjective: Patient is a 83 y.o. female here for AMS.  She is here with her caretaker and husband who provide the history.  Over the past 3 days, the patient has been more combative and not taking her medication.  She has not been ill recently.  No coughing, shortness of breath, sore throat, or rashes.  They checked her sugar and it was in the low to mid 100s.  She was compliant with medication leading up to this episode.  Oral intake of fluid and food is decreased.  Urine is very dark and malodorous.  She has been having loose stools leading into this episode.  Past Medical History:  Diagnosis Date   Allergy     Dust Mites   Ankle pain    chronic   Diabetes mellitus, type 2 (HCC)    Hyperlipidemia    Hypertension    Memory loss    Osteoarthritis    RENAL INSUFFICIENCY, CHRONIC 04/16/2010   Rhinitis    allergic nos    Objective: BP 112/70 (BP Location: Left Arm, Patient Position: Sitting)   Pulse 94   Temp 98 F (36.7 C) (Oral)   Resp 16   Ht 5' 5 (1.651 m)   Wt 220 lb (99.8 kg)   SpO2 96%   BMI 36.61 kg/m  General: Awake, appears stated age Mouth: MMM Ears: Mostly patent, TMs negative Nose: No rhinorrhea, patent Mouth: MMM, no pharyngeal exudate or erythema Heart: RRR, no LE edema Lungs: CTAB, no rales, wheezes or rhonchi. No accessory muscle use Abdomen: Bowel sounds present, soft, nontender, nondistended Neuro: Answers yes/no questions appropriately Psych: Limited judgment and insight, normal affect  Assessment and Plan: Altered mental status, unspecified altered mental status type - Plan: TSH, CBC, Comprehensive metabolic panel with GFR, B12, Urine Culture, POCT Urinalysis Dipstick, cefTRIAXone  (ROCEPHIN ) injection 1 g  Suspected UTI - Plan: cefTRIAXone  (ROCEPHIN ) injection 1 g  Severe dementia with other behavioral disturbance, unspecified dementia type (HCC)  Exacerbation of chronic issue 2/2  Delirium.  Check above.  For loose stools in a female, will empirically treat with Rocephin .  I will send in a 6-day course of Keflex  to start tomorrow, hopefully if the Rocephin  helps today and she starts swallowing the oral medication tomorrow.  If things worsen I instructed her caretaker and spouse to take her to the emergency department.  Try to stay hydrated treated.  Check above labs to rule out metabolic causes. The patient's caregiver and spouse voiced understanding and agreement to the plan.  Mabel [place], DO 09/19/23  5:01 PM

## 2023-09-19 NOTE — Telephone Encounter (Signed)
 FYI Only or Action Required?: FYI only for provider.  Patient was last seen in primary care on 08/19/2023 by Amon Aloysius BRAVO, MD.  Called Nurse Triage reporting Altered Mental Status, Hallucinations, refusing meds, eating less, and blood sugar fluctuating.  Symptoms began several weeks ago.  Interventions attempted: Rest, hydration, or home remedies.  Symptoms are: gradually worsening.  Triage Disposition: See HCP Within 4 Hours (Or PCP Triage)  Patient/caregiver understands and will follow disposition?: Yes      Reason for Disposition  [1] Longstanding confusion (e.g., dementia, stroke) AND [2] getting worse  Answer Assessment - Initial Assessment Questions 1. LEVEL OF CONSCIOUSNESS: How are they (the patient) acting right now? (e.g., alert-oriented, confused, lethargic, stuporous, comatose)     Wakes up profane language, doesn't want to get up Then when we get her up, she's alright Once she sits down for breakfast going to sleep, eat a little something, then go back to sleep She'll eat part of some apple and activia yogurt, sleep, then [place] bacon or eggs but won't eat it, won't eat rest of the day She's got her days and nights mixed up, she'll want to sit up at night, then in the mornings don't want to get up Seeing people, get them people out of here, hallucinating, on and off for a little while, at least 2-3 weeks started seeing things that are not there 2. ONSET: When did the confusion start?  (e.g., minutes, hours, days)     She doesn't remember a lot, got dementia More confused the last few days or so 3. PATTERN: Does this come and go, or has it been constant since it started?  Is it present now?     Constantly getting a little worse 7. OTHER SYMPTOMS: Are there any other symptoms? (e.g., difficulty breathing, fever, headache, weakness)     No SOB, fever Hard for us  to get her up, get her up to chair, once get her up walking then okay, hardest thing to get her up,  leave me alone, y'all trying to kill me, touch her anywhere, that hurts that hurts, not hurting her Husband states not threat to herself or others  Her sugar has been up, was up bad 200-300s at one time couple weeks ago said high per thing on her arm, yesterday was down 150-160    Advised pt be examined shortly today, scheduled with PCP office for this afternoon, advised call back if worsening or new symptoms.  Protocols used: Confusion - Delirium-A-AH

## 2023-09-20 ENCOUNTER — Ambulatory Visit: Payer: Self-pay | Admitting: Family Medicine

## 2023-09-20 LAB — COMPREHENSIVE METABOLIC PANEL WITH GFR
ALT: 17 U/L (ref 0–35)
AST: 19 U/L (ref 0–37)
Albumin: 4 g/dL (ref 3.5–5.2)
Alkaline Phosphatase: 91 U/L (ref 39–117)
BUN: 15 mg/dL (ref 6–23)
CO2: 28 meq/L (ref 19–32)
Calcium: 10 mg/dL (ref 8.4–10.5)
Chloride: 100 meq/L (ref 96–112)
Creatinine, Ser: 1.89 mg/dL — ABNORMAL HIGH (ref 0.40–1.20)
GFR: 23.93 mL/min — ABNORMAL LOW (ref >60.00)
Glucose, Bld: 236 mg/dL — ABNORMAL HIGH (ref 70–99)
Potassium: 3.9 meq/L (ref 3.5–5.1)
Sodium: 139 meq/L (ref 135–145)
Total Bilirubin: 0.5 mg/dL (ref 0.2–1.2)
Total Protein: 7.6 g/dL (ref 6.0–8.3)

## 2023-09-20 LAB — CBC
HCT: 45.5 % (ref 36.0–46.0)
Hemoglobin: 15 g/dL (ref 12.0–15.0)
MCHC: 32.9 g/dL (ref 30.0–36.0)
MCV: 97.2 fl (ref 78.0–100.0)
Platelets: 221 K/uL (ref 150.0–400.0)
RBC: 4.68 Mil/uL (ref 3.87–5.11)
RDW: 14.5 % (ref 11.5–15.5)
WBC: 5.6 K/uL (ref 4.0–10.5)

## 2023-09-20 LAB — TSH: TSH: 1.34 u[IU]/mL (ref 0.35–5.50)

## 2023-09-20 LAB — VITAMIN B12: Vitamin B-12: >1500 pg/mL — ABNORMAL HIGH (ref 211–911)

## 2023-09-21 ENCOUNTER — Other Ambulatory Visit

## 2023-09-21 ENCOUNTER — Other Ambulatory Visit: Payer: Self-pay

## 2023-09-21 DIAGNOSIS — R3989 Other symptoms and signs involving the genitourinary system: Secondary | ICD-10-CM

## 2023-09-22 LAB — URINE CULTURE
MICRO NUMBER:: 16765145
Result:: NO GROWTH
SPECIMEN QUALITY:: ADEQUATE

## 2023-09-23 ENCOUNTER — Ambulatory Visit: Payer: Self-pay | Admitting: Family Medicine

## 2023-09-23 DIAGNOSIS — E119 Type 2 diabetes mellitus without complications: Secondary | ICD-10-CM

## 2023-09-23 DIAGNOSIS — G301 Alzheimer's disease with late onset: Secondary | ICD-10-CM | POA: Diagnosis not present

## 2023-09-23 DIAGNOSIS — F02B0 Dementia in other diseases classified elsewhere, moderate, without behavioral disturbance, psychotic disturbance, mood disturbance, and anxiety: Secondary | ICD-10-CM | POA: Diagnosis not present

## 2023-09-30 ENCOUNTER — Other Ambulatory Visit: Payer: Self-pay

## 2023-09-30 ENCOUNTER — Inpatient Hospital Stay: Admission: RE | Admit: 2023-09-30 | Payer: Medicare Other | Source: Ambulatory Visit

## 2023-09-30 ENCOUNTER — Telehealth: Payer: Self-pay | Admitting: Dietician

## 2023-09-30 DIAGNOSIS — E1165 Type 2 diabetes mellitus with hyperglycemia: Secondary | ICD-10-CM

## 2023-09-30 MED ORDER — INSULIN LISPRO PROT & LISPRO (75-25 MIX) 100 UNIT/ML KWIKPEN: PEN_INJECTOR | SUBCUTANEOUS | 3 refills | Status: DC

## 2023-09-30 MED ORDER — SURE COMFORT PEN NEEDLES 31G X 5 MM MISC: 3 refills | Status: DC

## 2023-09-30 NOTE — Telephone Encounter (Signed)
 Patient's husband called and he stated that they have traveled to [place]  and they forgot patient's Humalog  75/25, pen needles, and Trulicity  at home.  Discussed he can speak to a local pharmacy to help transfer the prescription.  Discussed that she can wait until Monday to take her Trulicity  if she is unable to get this but does need her Humalog  75/25.  She has not taken her insulin  today.  He asks if a prescription can be sent to [facility] in [place]. [place] to get her through until Monday when they return.  Message sent to RN.    Leita Constable, RD, LDN, CDCES, DipACLM

## 2023-09-30 NOTE — Telephone Encounter (Signed)
 Returned patient call as he is concerned about the prescription for his wife's medication.  Informed patient that the prescription for her insulin  and pen needles was sent to [facility] in [place]. [place].    He states that his daughter is picking this up now.  Leita Constable, RD, LDN, CDCES, DipACLM

## 2023-09-30 NOTE — Telephone Encounter (Signed)
-----   Message from Leita LITTIE Constable, RD sent at 09/30/2023  1:24 PM EDT -----  See note.  Patient's husband would like a prescription for Humalog  75/25 and pen needles sent to [facility] in [place]. [place] - enough to get her through Monday.  She also forgot her Trulicity .

## 2023-09-30 NOTE — Telephone Encounter (Signed)
 Refill request sent to pharmacy.

## 2023-10-04 ENCOUNTER — Ambulatory Visit (INDEPENDENT_AMBULATORY_CARE_PROVIDER_SITE_OTHER): Admitting: Internal Medicine

## 2023-10-04 ENCOUNTER — Encounter: Payer: Self-pay | Admitting: Internal Medicine

## 2023-10-04 VITALS — BP 126/76 | HR 73 | Temp 98.0°F | Resp 18 | Ht 65.0 in

## 2023-10-04 DIAGNOSIS — F02C11 Dementia in other diseases classified elsewhere, severe, with agitation: Secondary | ICD-10-CM

## 2023-10-04 DIAGNOSIS — G308 Other Alzheimer's disease: Secondary | ICD-10-CM | POA: Diagnosis not present

## 2023-10-04 NOTE — Progress Notes (Signed)
 Subjective:    Patient ID: Angel Green, female    DOB: 29-Jul-1940, 83 y.o.   MRN: [id number]  DOS:  10/04/2023 Type of visit - description: Follow-up   Chart reviewed 09/19/2023 Seen by Dr. Frann with mental status changes, 3 days prior to the visit she become more combative, declining to take her medications. UTI was suspected.  Were not able to get urinalysis, received Rocephin , cephalexin .  Eventually a urine culture was negative. Blood work was stable, creatinine 1.8 (near baseline), white count not elevated,  Since then, she is less combative but she is still spitting out her morning  medicines.  She is more receptive to pills in the afternoon. Appetite is okay according to the caregiver. No fever or chills. Bowel movements are almost daily without straining. Urine is clear. No cough. She seems to be in pain when she transfer for instance from the bed to the chair.  Patient cannot point to any particular joint that is painful.  The family suspect pain is mostly from the lower extremities.   Wt Readings from Last 3 Encounters:  09/19/23 220 lb (99.8 kg)  08/19/23 220 lb (99.8 kg)  05/13/23 221 lb 1 oz (100.3 kg)    Review of Systems See above   Past Medical History:  Diagnosis Date   Allergy     Dust Mites   Ankle pain    chronic   Diabetes mellitus, type 2 (HCC)    Hyperlipidemia    Hypertension    Memory loss    Osteoarthritis    RENAL INSUFFICIENCY, CHRONIC 04/16/2010   Rhinitis    allergic nos    Past Surgical History:  Procedure Laterality Date   CATARACT EXTRACTION     CHOLECYSTECTOMY     dental implants     PARTIAL HYSTERECTOMY     in her 30s    Current Outpatient Medications  Medication Instructions   acetaminophen  (TYLENOL ) 1,000 mg, Oral, 2 times daily PRN   amLODipine  (NORVASC ) 5 mg, Oral, Daily   aspirin EC 81 mg, Daily   atorvastatin  (LIPITOR) 10 mg, Oral, Daily   Blood Glucose Monitoring Suppl (FREESTYLE LITE) DEVI Use to monitor  your blood sugars   Continuous Blood Gluc Sensor (FREESTYLE LIBRE 2 SENSOR) MISC 1 Device, Does not apply, Every 14 days   Cyanocobalamin  (B-12 PO) 1 tablet, Daily   donepezil  (ARICEPT ) 10 mg, Oral, Daily at bedtime   Dulaglutide  (TRULICITY ) 1.5 MG/0.5ML SOAJ Inject weekly   DULoxetine  (CYMBALTA ) 20 mg, Oral, Daily   EPINEPHrine (EPI-PEN) 0.3 mg,  Once   furosemide  (LASIX ) 40 mg, Oral, Daily   glucose blood (FREESTYLE LITE) test strip Use to monitor your blood sugars 4 times per day; E11.9   Insulin  Lispro Prot & Lispro (HUMALOG  75/25 MIX) (75-25) 100 UNIT/ML Kwikpen INJECT 40 UNITS UNDER THE SKIN DAILY BEFORE BREAKFAST   Insulin  Pen Needle (SURE COMFORT PEN NEEDLES) 31G X 5 MM MISC USE 1 PEN NEEDLE PER DAY   Lancets (FREESTYLE) lancets Use to monitor blood sugars 4 times per day, once before each meal and once at bedtime   loratadine  (CLARITIN ) 10 mg, Oral, Daily   memantine  (NAMENDA ) 10 mg, Oral, 2 times daily   Multiple Vitamins-Minerals (CENTRUM SILVER PO) 1 tablet, Daily   potassium chloride  (KLOR-CON ) 10 MEQ tablet 20 mEq, Oral, Daily   QUEtiapine  (SEROQUEL ) 50 mg, Oral, Daily at bedtime       Objective:   Physical Exam BP 126/76   Pulse 73   Temp  98 F (36.7 C) (Oral)   Resp 18   Ht 5' 5 (1.651 m)   SpO2 93%   BMI 36.61 kg/m  General:   Well developed, NAD, BMI noted. HEENT:  Normocephalic . Face symmetric, atraumatic Lungs:  CTA B Normal respiratory effort, no intercostal retractions, no accessory muscle use. Heart: RRR,  no murmur.  Lower extremities: no pretibial edema bilaterally  Skin: Not pale. Not jaundice Neurologic:  alert, profoundly demented, barely follows commands.  Hypointeractive. Psych--  Not combative today.    Assessment     Assessment   DM  Dr Angel Green-- Angel Green + Neuropathy: Per foot exam 12-2014 HTN ---change ACE to ARB is 03/2014 Hyperlipidemia CRI  dx 2012  Sees Dr Angel Green  Morbid obesity DJD, had  chronic ankle pain Allergies --  dust mites , occ uses a inhaler , sees allergist, has shots q weeks started ~ 08-2014 Dementia:  MMSE 7/218: 24, rx Aricept .  B12, RPR, sed rate and TSH normal MMSE 07/2017 :13 worse MRI brain 6/19:  prominent right temporal lobe volume loss.  PLAN Dementia with behavioral issues: Lately she has been combative, spitting her morning medications.  Was seen by Dr. Frann, treated empirically for a UTI. At this point, she seems to be less combative but she is still spitting her morning medications. ROS: See above, she has pain with transferring, related to DJD?Angel Green  This might account for some of her symptoms. Plan: Tylenol  500 mg 2 tablets twice daily every day, okay to change the timing of the medications from early in the morning to noon, communicate with Endo if sugars are too low or too high, see AVS. Good hydration.  Keep me informed of her progress.  The patient's husband and the caregiver Angel Green understood instructions and agreed. RTC as scheduled for October

## 2023-10-04 NOTE — Patient Instructions (Addendum)
 Start Tylenol  500 mg: 2 tablets twice daily  Other medications are the same .  If the sugars are going consistently over 200 or if she is having low sugars (less than 100): Please reach endocrinology  Okay to move the medications from early in the morning to later on , at around noon so she will accept them.  Be sure she drinks plenty of fluids and stay hydrated  Call if she has fever or chills or increased symptoms.  Next appointment is in October, sooner if needed

## 2023-10-05 NOTE — Assessment & Plan Note (Signed)
 Dementia with behavioral issues: Lately she has been combative, spitting her morning medications.  Was seen by Dr. Frann, treated empirically for a UTI. At this point, she seems to be less combative but she is still spitting her morning medications. ROS: See above, she has pain with transferring, related to DJD?[name]  This might account for some of her symptoms. Plan: Tylenol  500 mg 2 tablets twice daily every day, okay to change the timing of the medications from early in the morning to noon, communicate with Endo if sugars are too low or too high, see AVS. Good hydration.  Keep me informed of her progress.  The patient's husband and the caregiver Almarie understood instructions and agreed. RTC as scheduled for October

## 2023-10-07 ENCOUNTER — Ambulatory Visit
Admission: RE | Admit: 2023-10-07 | Discharge: 2023-10-07 | Disposition: A | Discharge status: Home / Self Care | Source: Ambulatory Visit | Attending: Internal Medicine | Admitting: Internal Medicine

## 2023-10-07 DIAGNOSIS — N6321 Unspecified lump in the left breast, upper outer quadrant: Secondary | ICD-10-CM | POA: Diagnosis not present

## 2023-10-07 DIAGNOSIS — N6320 Unspecified lump in the left breast, unspecified quadrant: Secondary | ICD-10-CM

## 2023-10-07 DIAGNOSIS — R928 Other abnormal and inconclusive findings on diagnostic imaging of breast: Secondary | ICD-10-CM | POA: Diagnosis not present

## 2023-10-08 ENCOUNTER — Encounter: Payer: Self-pay | Admitting: Internal Medicine

## 2023-10-08 ENCOUNTER — Other Ambulatory Visit: Payer: Self-pay | Admitting: Internal Medicine

## 2023-10-08 DIAGNOSIS — N6320 Unspecified lump in the left breast, unspecified quadrant: Secondary | ICD-10-CM

## 2023-10-08 DIAGNOSIS — R928 Other abnormal and inconclusive findings on diagnostic imaging of breast: Secondary | ICD-10-CM

## 2023-10-20 ENCOUNTER — Encounter: Payer: Self-pay | Admitting: Adult Health

## 2023-10-20 ENCOUNTER — Ambulatory Visit: Payer: Medicare Other | Admitting: Adult Health

## 2023-10-20 VITALS — BP 134/82 | HR 88 | Ht 64.0 in | Wt 186.4 lb

## 2023-10-20 DIAGNOSIS — F02C0 Dementia in other diseases classified elsewhere, severe, without behavioral disturbance, psychotic disturbance, mood disturbance, and anxiety: Secondary | ICD-10-CM

## 2023-10-20 DIAGNOSIS — G301 Alzheimer's disease with late onset: Secondary | ICD-10-CM

## 2023-10-20 MED ORDER — QUETIAPINE FUMARATE 50 MG PO TABS: 50.0000 mg | ORAL_TABLET | Freq: Every day | ORAL | 3 refills | Status: DC

## 2023-10-20 MED ORDER — MEMANTINE HCL 10 MG PO TABS: 10.0000 mg | ORAL_TABLET | Freq: Two times a day (BID) | ORAL | 3 refills | Status: DC

## 2023-10-20 MED ORDER — DONEPEZIL HCL 10 MG PO TABS: 10.0000 mg | ORAL_TABLET | Freq: Every day | ORAL | 3 refills | Status: AC

## 2023-10-20 NOTE — Patient Instructions (Addendum)
 Your Plan:  Continue Aricept , Namenda  and Seroquel  at current dosages       Follow up in 1 year or call earlier if needed     Thank you for coming to see us  at [facility]. I hope we have been able to provide you high quality care today.  You may receive a patient satisfaction survey over the next few weeks. We would appreciate your feedback and comments so that we may continue to improve ourselves and the health of our patients.

## 2023-10-20 NOTE — Progress Notes (Signed)
 [facility] [address] [place]. [place] [address] [phone]       OFFICE FOLLOW UP NOTE  [name] Date of Birth:  September 15, 1940 Medical Record Number:  [id number]    Primary neurologist: Dr. Onita Reason for visit: Dementia    SUBJECTIVE:   CHIEF COMPLAINT:  Chief Complaint  Patient presents with   Follow-up    RM 3, Pt w/caregiver and husband. Here for f/u for dementia.    Follow-up visit:  Prior visit: 10/19/2022  Brief HPI:   Angel Green is a 83 year old female with underlying medical history of diabetes, chronic renal insufficiency, HTN and dementia.  She was initially evaluated by Dr. Onita on 10/18/2017 for dementia with behavioral disturbance starting around 2018.  MRI brain 07/2021 significant brain atrophy.  Lab work no treatable etiology.  Felt to be consistent with Alzheimer's dementia.  At prior visit, unable to complete MMSE. Subjectively, memory stable per husband.  Recommended continuation of Aricept  and Namenda    Referral history:  Accompanied by husband who provides history and caregiver. Reports memory has been stable since prior visit, possibly some improvement. Does have occasional visual hallucinations but not bothersome.  Denies any significant agitation or aggression.  Reports she sleeps well at night.  Appetite can fluctuate.  Ambulates with rollator walker, no recent falls.  Continues to require assistance for ADLs. Does have caregiver during the day.  Remains on Aricept , Namenda  and Seroquel .  No questions or concerns at this time.     ROS:   N/A d/t dementia   PMH:  Past Medical History:  Diagnosis Date   Allergy     Dust Mites   Ankle pain    chronic   Diabetes mellitus, type 2 (HCC)    Hyperlipidemia    Hypertension    Memory loss    Osteoarthritis    RENAL INSUFFICIENCY, CHRONIC 04/16/2010   Rhinitis    allergic nos    PSH:  Past Surgical History:  Procedure Laterality Date   CATARACT  EXTRACTION     CHOLECYSTECTOMY     dental implants     PARTIAL HYSTERECTOMY     in her 30s    Social History:  Social History   Socioeconomic History   Marital status: Married    Spouse name: Not on file   Number of children: 2   Years of education: some college   Highest education level: Not on file  Occupational History   Occupation:  retired  Tobacco Use   Smoking status: Never   Smokeless tobacco: Never  Vaping Use   Vaping status: Never Used  Substance and Sexual Activity   Alcohol use: No    Alcohol/week: 0.0 standard drinks of alcohol   Drug use: No   Sexual activity: Yes    Partners: Male  Other Topics Concern   Not on file  Social History Narrative   Husband is a Optician, dispensing.   Right-handed.   1 cup caffeine daily.   Lives at home with husband.          Social Drivers of Corporate investment banker Strain: Low Risk  (08/17/2021)   Overall Financial Resource Strain (CARDIA)    Difficulty of Paying Living Expenses: Not very hard  Food Insecurity: No Food Insecurity (02/11/2022)   Hunger Vital Sign    Worried About Running Out of Food in the Last Year: Never true    Ran Out of Food in the Last Year: Never true  Transportation Needs: No  Transportation Needs (02/11/2022)   PRAPARE - Administrator, Civil Service (Medical): No    Lack of Transportation (Non-Medical): No  Physical Activity: Inactive (05/18/2022)   Exercise Vital Sign    Days of Exercise per Week: 0 days    Minutes of Exercise per Session: 0 min  Stress: Not on file  Social Connections: Not on file  Intimate Partner Violence: Not At Risk (02/09/2022)   Humiliation, Afraid, Rape, and Kick questionnaire    Fear of Current or Ex-Partner: No    Emotionally Abused: No    Physically Abused: No    Sexually Abused: No    Family History:  Family History  Problem Relation Age of Onset   Healthy Brother    Healthy Son    Healthy Son    Other Mother        unsure of history - old  age   Other Father        unsure of history - old age   Heart attack Neg Hx    Colon cancer Neg Hx    Breast cancer Neg Hx    Stroke Neg Hx     Medications:   Current Outpatient Medications on File Prior to Visit  Medication Sig Dispense Refill   acetaminophen  (TYLENOL ) 500 MG tablet Take 1,000 mg by mouth 2 (two) times daily as needed for moderate pain (pain score 4-6).     amLODipine  (NORVASC ) 5 MG tablet Take 1 tablet (5 mg total) by mouth daily. 90 tablet 1   aspirin EC 81 MG tablet Take 1 tablet (81 mg total) by mouth daily. Swallow whole.     atorvastatin  (LIPITOR) 10 MG tablet Take 1 tablet (10 mg total) by mouth daily. 90 tablet 1   Blood Glucose Monitoring Suppl (FREESTYLE LITE) DEVI Use to monitor your blood sugars 1 each 0   Continuous Blood Gluc Sensor (FREESTYLE LIBRE 2 SENSOR) MISC 1 Device by Does not apply route every 14 (fourteen) days. 6 each 3   Cyanocobalamin  (B-12 PO) Take 1 tablet by mouth daily. Takes 5000 mcg by mouth daily.     Dulaglutide  (TRULICITY ) 1.5 MG/0.5ML SOAJ Inject weekly 6 mL 1   DULoxetine  (CYMBALTA ) 20 MG capsule TAKE 1 CAPSULE DAILY 90 capsule 3   EPINEPHrine 0.3 mg/0.3 mL IJ SOAJ injection Inject 0.3 mg into the muscle once. Reported on 06/30/2015     furosemide  (LASIX ) 20 MG tablet Take 2 tablets (40 mg total) by mouth daily. 180 tablet 1   glucose blood (FREESTYLE LITE) test strip Use to monitor your blood sugars 4 times per day; E11.9 400 each 12   Insulin  Lispro Prot & Lispro (HUMALOG  75/25 MIX) (75-25) 100 UNIT/ML Kwikpen INJECT 40 UNITS UNDER THE SKIN DAILY BEFORE BREAKFAST 15 mL 3   Insulin  Pen Needle (SURE COMFORT PEN NEEDLES) 31G X 5 MM MISC USE 1 PEN NEEDLE PER DAY 100 each 3   Lancets (FREESTYLE) lancets Use to monitor blood sugars 4 times per day, once before each meal and once at bedtime 400 each 12   loratadine  (CLARITIN ) 10 MG tablet Take 1 tablet (10 mg total) by mouth daily. 90 tablet 1   Multiple Vitamins-Minerals (CENTRUM SILVER  PO) Take 1 tablet by mouth daily.     potassium chloride  (KLOR-CON ) 10 MEQ tablet Take 2 tablets (20 mEq total) by mouth daily. 180 tablet 1   No current facility-administered medications on file prior to visit.    Allergies:   Allergies  Allergen Reactions   Dust Mite Mixed Allergen Ext [Mite (D. Farinae)]       OBJECTIVE:  Physical Exam  Vitals:   10/20/23 1052  BP: 134/82  Pulse: 88  Weight: 186 lb 6.4 oz (84.6 kg)  Height: 5' 4 (1.626 m)   Body mass index is 32 kg/m. No results found.  General: well developed, well nourished, very pleasant elderly African-American female, seated, in no evident distress  Neurologic Exam Mental Status: Awake and fully alert.  Disoriented to place and time, unable to provide last name or DOB.  Husband provides majority of today's history.  Difficulty following some commands.  Mood and affect appropriate.      10/20/2023   11:11 AM 10/19/2022   10:54 AM 05/18/2022    3:16 PM 06/17/2020   10:45 AM 01/31/2020    9:51 AM 08/09/2019    2:53 PM 11/14/2018    9:31 AM  MMSE - Mini Mental State Exam  Not completed: Unable to complete Unable to complete Unable to complete    Unable to complete  Orientation to time    0 0 1   Orientation to Place    0 0 3   Registration    3 3 3    Attention/ Calculation    0 0 0   Recall    0 0 0   Language- name 2 objects    2 2 2    Language- repeat    1 1 1    Language- follow 3 step command    3 0 3   Language- read & follow direction    1 0 1   Write a sentence    0 0 1   Copy design    0 0 0   Copy design-comments      4 animals   Total score    10 6 15     Cranial Nerves: Pupils equal, briskly reactive to light. Extraocular movements full without nystagmus. Visual fields full to confrontation. Hearing intact. Facial sensation intact. Face, tongue, palate moves normally and symmetrically.  Motor: Normal bulk and tone. Normal strength in all tested extremity muscles. Gait and Station: Arises from chair  with difficulty needing 2 person assistance. Stance is hunched.  Gait demonstrates slow cautious steps with use of rollator walker        ASSESSMENT/PLAN: Angel Green is a 83 y.o. year old female with underlying medical history of DM, HTN, chronic renal dysfunction and dementia.  Onset of cognitive decline in 2018 likely in setting of Alzheimer's dementia   -Cognition has been stable per husband - unable to complete MMSE -Continue Aricept  and Namenda  for cognition- refill provided -Continue Seroquel  50 mg nightly to help with sleep and behaviors - refill provided -Continue to stay active as tolerated, routine socialization and ensuring good sleep.  Discussed importance of adequate fluid intake and healthy diet, advised husband to further discuss with PCP if appetite declines    Follow-up in 1 year or call earlier if needed (husband requests yearly f/u visits)     CC:  Amon Aloysius BRAVO, MD    I personally spent a total of 25 minutes in the care of the patient today including preparing to see the patient, performing a medically appropriate exam/evaluation, counseling and educating, placing orders, and documenting clinical information in the EHR.   Harlene Bogaert, AGNP-BC  [facility] [address] [place], [place] [address]  Phone [phone] Fax [phone] Note: This document was prepared with  digital dictation and possible smart Lobbyist. Any transcriptional errors that result from this process are unintentional.

## 2023-10-24 DIAGNOSIS — G301 Alzheimer's disease with late onset: Secondary | ICD-10-CM | POA: Diagnosis not present

## 2023-10-24 DIAGNOSIS — F02B0 Dementia in other diseases classified elsewhere, moderate, without behavioral disturbance, psychotic disturbance, mood disturbance, and anxiety: Secondary | ICD-10-CM | POA: Diagnosis not present

## 2023-10-26 ENCOUNTER — Other Ambulatory Visit: Payer: Self-pay | Admitting: Internal Medicine

## 2023-11-08 ENCOUNTER — Other Ambulatory Visit: Payer: Self-pay

## 2023-11-12 DIAGNOSIS — Z23 Encounter for immunization: Secondary | ICD-10-CM | POA: Diagnosis not present

## 2023-11-17 ENCOUNTER — Ambulatory Visit: Admitting: Endocrinology

## 2023-11-22 ENCOUNTER — Ambulatory Visit: Payer: Self-pay | Admitting: Endocrinology

## 2023-11-22 ENCOUNTER — Encounter: Payer: Self-pay | Admitting: Endocrinology

## 2023-11-22 ENCOUNTER — Ambulatory Visit: Admitting: Endocrinology

## 2023-11-22 VITALS — BP 126/60 | HR 86 | Resp 20 | Ht 64.0 in | Wt 203.8 lb

## 2023-11-22 DIAGNOSIS — E1165 Type 2 diabetes mellitus with hyperglycemia: Secondary | ICD-10-CM | POA: Diagnosis not present

## 2023-11-22 DIAGNOSIS — Z794 Long term (current) use of insulin: Secondary | ICD-10-CM | POA: Diagnosis not present

## 2023-11-22 LAB — POCT GLYCOSYLATED HEMOGLOBIN (HGB A1C): Hemoglobin A1C: 8.2 % — AB (ref 4.0–5.6)

## 2023-11-22 MED ORDER — INSULIN LISPRO PROT & LISPRO (75-25 MIX) 100 UNIT/ML KWIKPEN: PEN_INJECTOR | SUBCUTANEOUS | 3 refills | Status: AC

## 2023-11-22 MED ORDER — TRULICITY 3 MG/0.5ML ~~LOC~~ SOAJ: 3.0000 mg | SUBCUTANEOUS | 4 refills | Status: AC

## 2023-11-22 NOTE — Progress Notes (Signed)
 Outpatient Endocrinology Note [place] Jesson Foskey, MD  11/22/23  Patient's Name: Angel Green    DOB: 22-Apr-1940    MRN: [id number]                                                    REASON OF VISIT: Follow up of type 2 diabetes mellitus  PCP: Amon Aloysius BRAVO, MD  HISTORY OF PRESENT ILLNESS:   Angel Green is a 83 y.o. old female with past medical history listed below, is here for follow up  type 2 diabetes mellitus.   Pertinent Diabetes History: He was diagnosed with type 2 diabetes mellitus in 1999.  She has been on insulin  therapy from 2010 and Trulicity  was started in 2019.    Chronic Diabetes Complications : Retinopathy: no. Last ophthalmology exam was done on 04/2022, reportedly. Nephropathy: CKD, following with nephrology. Peripheral neuropathy:no Coronary artery disease: no Stroke: no  Relevant comorbidities and cardiovascular risk factors: Obesity: yes Body mass index is 34.98 kg/m.  Hypertension: yes Hyperlipidemia. Yes, on a statin.  Current / Home Diabetic regimen includes: Humulin mix 75/25 : 40 units daily in the morning with breakfast. Trulicity  1.5 mg weekly. Saturdays.   Prior diabetic medications: She used to be on Trulicity  4.5 mg weekly was decreased due to weight loss.  Evening dose of Humalin mix was stopped due to hypoglycemia.  Glycemic data:    CONTINUOUS GLUCOSE MONITORING SYSTEM (CGMS) INTERPRETATION:             FreeStyle Libre 3+ CGM-  Sensor Download (Sensor download was reviewed and summarized below.) Dates: September 17 to November 22, 2023, 14 days Sensor Average: 184  Glucose Management Indicator: 7.7%  % data captured: 98%    Impression: - Mostly acceptable blood sugar with occasional hyperglycemia with blood sugar up to 250-300 range in the afternoon and in the evening/bedtime related to meals postprandially.  No hypoglycemia.  Blood sugar trending down overnight.  Hypoglycemia: Patient has no hypoglycemic episodes. Patient has  hypoglycemia awareness.  Factors modifying glucose control: 1.  Diabetic diet assessment: 3 meals a day last meal of the day around 5 to 6 PM.  She eats breakfast around 11 AM.  Denies bedtime snack.  2.  Staying active or exercising: No formal exercise.  3.  Medication compliance: compliant all Of the time.  Interval history Patient is accompanied by her husband and caregiver in the clinic today.  CGM data as reviewed above.  Reports that blood sugar has been mostly running 170s to 200 range.  Hemoglobin A1c worsened to 8.2%.  Diabetes regimen as reviewed above.  Patient was last seen in March.  Patient was advised to take insulin  mix 35 units daily due to hypoglycemia however patient has been taking 40 units daily.  No other complaints today.  REVIEW OF SYSTEMS As per history of present illness.   PAST MEDICAL HISTORY: Past Medical History:  Diagnosis Date   Allergy     Dust Mites   Ankle pain    chronic   Diabetes mellitus, type 2 (HCC)    Hyperlipidemia    Hypertension    Memory loss    Osteoarthritis    RENAL INSUFFICIENCY, CHRONIC 04/16/2010   Rhinitis    allergic nos    PAST SURGICAL HISTORY: Past Surgical History:  Procedure Laterality Date   CATARACT EXTRACTION  CHOLECYSTECTOMY     dental implants     PARTIAL HYSTERECTOMY     in her 30s    ALLERGIES: Allergies  Allergen Reactions   Dust Mite Mixed Allergen Ext [Mite (D. Farinae)]     FAMILY HISTORY:  Family History  Problem Relation Age of Onset   Healthy Brother    Healthy Son    Healthy Son    Other Mother        unsure of history - old age   Other Father        unsure of history - old age   Heart attack Neg Hx    Colon cancer Neg Hx    Breast cancer Neg Hx    Stroke Neg Hx     SOCIAL HISTORY: Social History   Socioeconomic History   Marital status: Married    Spouse name: Not on file   Number of children: 2   Years of education: some college   Highest education level: Not on  file  Occupational History   Occupation:  retired  Tobacco Use   Smoking status: Never   Smokeless tobacco: Never  Vaping Use   Vaping status: Never Used  Substance and Sexual Activity   Alcohol use: No    Alcohol/week: 0.0 standard drinks of alcohol   Drug use: No   Sexual activity: Yes    Partners: Male  Other Topics Concern   Not on file  Social History Narrative   Husband is a Optician, dispensing.   Right-handed.   1 cup caffeine daily.   Lives at home with husband.          Social Drivers of Corporate investment banker Strain: Low Risk  (08/17/2021)   Overall Financial Resource Strain (CARDIA)    Difficulty of Paying Living Expenses: Not very hard  Food Insecurity: No Food Insecurity (02/11/2022)   Hunger Vital Sign    Worried About Running Out of Food in the Last Year: Never true    Ran Out of Food in the Last Year: Never true  Transportation Needs: No Transportation Needs (02/11/2022)   PRAPARE - Administrator, Civil Service (Medical): No    Lack of Transportation (Non-Medical): No  Physical Activity: Inactive (05/18/2022)   Exercise Vital Sign    Days of Exercise per Week: 0 days    Minutes of Exercise per Session: 0 min  Stress: Not on file  Social Connections: Not on file    MEDICATIONS:  Current Outpatient Medications  Medication Sig Dispense Refill   acetaminophen  (TYLENOL ) 500 MG tablet Take 1,000 mg by mouth 2 (two) times daily as needed for moderate pain (pain score 4-6).     amLODipine  (NORVASC ) 5 MG tablet Take 1 tablet (5 mg total) by mouth daily. 90 tablet 1   aspirin EC 81 MG tablet Take 1 tablet (81 mg total) by mouth daily. Swallow whole.     atorvastatin  (LIPITOR) 10 MG tablet Take 1 tablet (10 mg total) by mouth daily. 90 tablet 1   Blood Glucose Monitoring Suppl (FREESTYLE LITE) DEVI Use to monitor your blood sugars 1 each 0   Continuous Blood Gluc Sensor (FREESTYLE LIBRE 2 SENSOR) MISC 1 Device by Does not apply route every 14 (fourteen)  days. 6 each 3   Cyanocobalamin  (B-12 PO) Take 1 tablet by mouth daily. Takes 5000 mcg by mouth daily.     donepezil  (ARICEPT ) 10 MG tablet Take 1 tablet (10 mg total) by mouth at bedtime.  90 tablet 3   Dulaglutide  (TRULICITY ) 3 MG/0.5ML SOAJ Inject 3 mg into the skin once a week. 6 mL 4   DULoxetine  (CYMBALTA ) 20 MG capsule TAKE 1 CAPSULE DAILY 90 capsule 3   EPINEPHrine 0.3 mg/0.3 mL IJ SOAJ injection Inject 0.3 mg into the muscle once. Reported on 06/30/2015     furosemide  (LASIX ) 20 MG tablet Take 2 tablets (40 mg total) by mouth daily. 180 tablet 1   glucose blood (FREESTYLE LITE) test strip Use to monitor your blood sugars 4 times per day; E11.9 400 each 12   Insulin  Pen Needle (SURE COMFORT PEN NEEDLES) 31G X 5 MM MISC USE 1 PEN NEEDLE PER DAY 100 each 3   Lancets (FREESTYLE) lancets Use to monitor blood sugars 4 times per day, once before each meal and once at bedtime 400 each 12   loratadine  (CLARITIN ) 10 MG tablet Take 1 tablet (10 mg total) by mouth daily. 90 tablet 1   memantine  (NAMENDA ) 10 MG tablet Take 1 tablet (10 mg total) by mouth 2 (two) times daily. 180 tablet 3   Multiple Vitamins-Minerals (CENTRUM SILVER PO) Take 1 tablet by mouth daily.     potassium chloride  (KLOR-CON  M) 10 MEQ tablet TAKE 2 TABLETS DAILY 180 tablet 3   QUEtiapine  (SEROQUEL ) 50 MG tablet Take 1 tablet (50 mg total) by mouth at bedtime. 90 tablet 3   Insulin  Lispro Prot & Lispro (HUMALOG  75/25 MIX) (75-25) 100 UNIT/ML Kwikpen INJECT 35 UNITS UNDER THE SKIN DAILY BEFORE BREAKFAST 15 mL 3   No current facility-administered medications for this visit.    PHYSICAL EXAM: Vitals:   11/22/23 1341  BP: 126/60  Pulse: 86  Resp: 20  SpO2: 96%  Weight: 203 lb 12.8 oz (92.4 kg)  Height: 5' 4 (1.626 m)   Body mass index is 34.98 kg/m.  Wt Readings from Last 3 Encounters:  11/22/23 203 lb 12.8 oz (92.4 kg)  10/20/23 186 lb 6.4 oz (84.6 kg)  09/19/23 220 lb (99.8 kg)    General: Well developed, well  nourished female in no apparent distress.  HEENT: AT/[place], no external lesions.  Eyes: Conjunctiva clear and no icterus. Neck: Neck supple  Lungs: Respirations not labored Neurologic: Alert, oriented, normal speech Extremities / Skin: Dry.   Psychiatric: Does not appear depressed or anxious  Diabetic Foot Exam - Simple   No data filed     LABS Reviewed Lab Results  Component Value Date   HGBA1C 8.2 (A) 11/22/2023   HGBA1C 7.8 (A) 05/10/2023   HGBA1C 7.6 (A) 02/09/2023   Lab Results  Component Value Date   FRUCTOSAMINE 299 (H) 06/26/2020   FRUCTOSAMINE 370 (H) 04/22/2015   Lab Results  Component Value Date   CHOL 157 05/13/2023   HDL 56.10 05/13/2023   LDLCALC 78 05/13/2023   TRIG 110.0 05/13/2023   CHOLHDL 3 05/13/2023   Lab Results  Component Value Date   MICRALBCREAT 3.0 01/11/2023   MICRALBCREAT 3.7 01/14/2009   Lab Results  Component Value Date   CREATININE 1.89 (H) 09/19/2023   Lab Results  Component Value Date   GFR 23.93 (L) 09/19/2023    ASSESSMENT / PLAN  1. Uncontrolled type 2 diabetes mellitus with hyperglycemia, with long-term current use of insulin  (HCC)     Diabetes Mellitus type 2, complicated by CKD - Diabetic status / severity: Uncontrolled  Lab Results  Component Value Date   HGBA1C 8.2 (A) 11/22/2023    - Hemoglobin A1c goal : <7.5-8%,  taking the account of her age and comorbidities.    - Medications:   I) decrease Humalog  mix 75/25: 40 units with breakfast , change to 35 units daily.  II) increase Trulicity  1.5 mg weekly to 3 mg weekly.  - Home glucose testing: Continue CGM or check blood sugar as needed. - Discussed/ Gave Hypoglycemia treatment plan.  # Consult : not required at this time.   # Annual urine for microalbuminuria/ creatinine ratio, no microalbuminuria currently, following with nephrology.   Last  Lab Results  Component Value Date   MICRALBCREAT 3.0 01/11/2023    # Foot check nightly.  # Annual dilated  diabetic eye exams.   - Diet: Make healthy diabetic food choices   2. Blood pressure  -  BP Readings from Last 1 Encounters:  11/22/23 126/60    - Control is in target.  - No change in current plans.  3. Lipid status / Hyperlipidemia - Last  Lab Results  Component Value Date   LDLCALC 78 05/13/2023   - Continue atorvastatin  10 mg daily.  Managed by primary care provider.  Diagnoses and all orders for this visit:  Uncontrolled type 2 diabetes mellitus with hyperglycemia, with long-term current use of insulin  (HCC) -     POCT glycosylated hemoglobin (Hb A1C) -     Dulaglutide  (TRULICITY ) 3 MG/0.5ML SOAJ; Inject 3 mg into the skin once a week. -     Insulin  Lispro Prot & Lispro (HUMALOG  75/25 MIX) (75-25) 100 UNIT/ML Kwikpen; INJECT 35 UNITS UNDER THE SKIN DAILY BEFORE BREAKFAST    DISPOSITION Follow up in clinic in 3 months suggested.   All questions answered and patient verbalized understanding of the plan.  [place] Montee Tallman, MD [facility] Endocrinology [facility] [address] [place], [address] [place], [place] [address] Phone # [phone]  At least part of this note was generated using voice recognition software. Inadvertent word errors may have occurred, which were not recognized during the proofreading process.

## 2023-11-22 NOTE — Patient Instructions (Signed)
 Diabetes regimen:  Humalog  mix 75/25: stay on current dose and will adjust dose after review of glucose data.   Increase trulicity  3 mg weekly.

## 2023-11-23 ENCOUNTER — Telehealth: Payer: Self-pay

## 2023-11-23 DIAGNOSIS — G301 Alzheimer's disease with late onset: Secondary | ICD-10-CM | POA: Diagnosis not present

## 2023-11-23 DIAGNOSIS — F02B0 Dementia in other diseases classified elsewhere, moderate, without behavioral disturbance, psychotic disturbance, mood disturbance, and anxiety: Secondary | ICD-10-CM | POA: Diagnosis not present

## 2023-11-23 NOTE — Telephone Encounter (Signed)
 Spoke to spouse gave following  instructions per MD   I would like to decrease Humulin mix insulin  from 40 to 35 units daily before breakfast. I have increased the dose of Trulicity  to 3 mg weekly in the visit and I have sent prescription also. Thanks.   Spouse stated an understanding. No further questions at this time.

## 2023-11-24 ENCOUNTER — Other Ambulatory Visit: Payer: Self-pay | Admitting: Family

## 2023-12-02 ENCOUNTER — Telehealth: Payer: Self-pay

## 2023-12-02 NOTE — Telephone Encounter (Signed)
 Patient was identified as falling into the [place] Measure - Diabetes.   Patient was: Appointment already scheduled for:  F/U with PCP on 12/19/23. Angel Green

## 2023-12-03 ENCOUNTER — Other Ambulatory Visit: Payer: Self-pay | Admitting: Adult Health

## 2023-12-05 NOTE — Telephone Encounter (Signed)
 Last filled by patient on 11/05/23 Last office visit : 10/20/23 and Namenda  was sent in as a 90 day supply with 3 refills  Next office visit : 10/25/24 - Conitnue for cognition

## 2023-12-12 ENCOUNTER — Ambulatory Visit (INDEPENDENT_AMBULATORY_CARE_PROVIDER_SITE_OTHER): Admitting: Podiatry

## 2023-12-12 ENCOUNTER — Encounter: Payer: Self-pay | Admitting: Podiatry

## 2023-12-12 DIAGNOSIS — I739 Peripheral vascular disease, unspecified: Secondary | ICD-10-CM | POA: Diagnosis not present

## 2023-12-12 DIAGNOSIS — M79675 Pain in left toe(s): Secondary | ICD-10-CM | POA: Diagnosis not present

## 2023-12-12 DIAGNOSIS — E1142 Type 2 diabetes mellitus with diabetic polyneuropathy: Secondary | ICD-10-CM | POA: Diagnosis not present

## 2023-12-12 DIAGNOSIS — M79674 Pain in right toe(s): Secondary | ICD-10-CM

## 2023-12-12 DIAGNOSIS — B351 Tinea unguium: Secondary | ICD-10-CM | POA: Diagnosis not present

## 2023-12-12 NOTE — Progress Notes (Signed)
  Subjective:  Patient ID: Angel Green, female    DOB: 16-Aug-1940,   MRN: [id number]  Chief Complaint  Patient presents with   Diabetes    Toenails  Saw Dr. Mercie - 11/22/2023; A1c - 8.2    83 y.o. female presents for concern of thickened elongated and painful nails that are difficult to trim. Requesting to have them trimmed today. Relates burning and tingling in their feet. Patient is diabetic and last A1c was  Lab Results  Component Value Date   HGBA1C 8.2 (A) 11/22/2023   .   PCP:  Amon Aloysius BRAVO, MD    . Denies any other pedal complaints. Denies n/v/f/c.   Past Medical History:  Diagnosis Date   Allergy     Dust Mites   Ankle pain    chronic   Diabetes mellitus, type 2 (HCC)    Hyperlipidemia    Hypertension    Memory loss    Osteoarthritis    RENAL INSUFFICIENCY, CHRONIC 04/16/2010   Rhinitis    allergic nos    Objective:  Physical Exam: Vascular: DP/PT pulses 2/4 bilateral. CFT <3 seconds. Absent hair growth on digits. Edema noted to bilateral lower extremities. Xerosis noted bilaterally.  Skin. No lacerations or abrasions bilateral feet. Nails 1-5 bilateral  are thickened discolored and elongated with subungual debris.  Musculoskeletal: MMT 5/5 bilateral lower extremities in DF, PF, Inversion and Eversion. Deceased ROM in DF of ankle joint.  Neurological: Sensation intact to light touch. Protective sensation diminished bilateral.    Assessment:   1. Pain due to onychomycosis of toenails of both feet   2. Type 2 diabetes mellitus with peripheral neuropathy (HCC)   3. PAD (peripheral artery disease)      Plan:  Patient was evaluated and treated and all questions answered. -Discussed and educated patient on diabetic foot care, especially with  regards to the vascular, neurological and musculoskeletal systems.  -Stressed the importance of good glycemic control and the detriment of not  controlling glucose levels in relation to the foot. -Discussed supportive  shoes at all times and checking feet regularly.  -Mechanically debrided all nails 1-5 bilateral using sterile nail nipper and filed with dremel without incident  -Answered all patient questions -Patient to return  in 3 months for at risk foot care -Patient advised to call the office if any problems or questions arise in the meantime.   Asberry Failing, DPM

## 2023-12-19 ENCOUNTER — Telehealth: Payer: Self-pay

## 2023-12-19 ENCOUNTER — Ambulatory Visit: Admitting: Internal Medicine

## 2023-12-19 ENCOUNTER — Encounter: Payer: Self-pay | Admitting: Internal Medicine

## 2023-12-19 VITALS — BP 98/65 | HR 89 | Temp 97.6°F | Resp 14 | Ht 64.0 in

## 2023-12-19 DIAGNOSIS — F02C11 Dementia in other diseases classified elsewhere, severe, with agitation: Secondary | ICD-10-CM | POA: Diagnosis not present

## 2023-12-19 DIAGNOSIS — I10 Essential (primary) hypertension: Secondary | ICD-10-CM

## 2023-12-19 DIAGNOSIS — G308 Other Alzheimer's disease: Secondary | ICD-10-CM | POA: Diagnosis not present

## 2023-12-19 DIAGNOSIS — N184 Chronic kidney disease, stage 4 (severe): Secondary | ICD-10-CM

## 2023-12-19 NOTE — Progress Notes (Addendum)
 Subjective:    Patient ID: Angel Green, female    DOB: 1941/01/15, 83 y.o.   MRN: [id number]  DOS:  12/19/2023 Follow-up, here with her husband and Almarie (caregiver)  Discussed the use of AI scribe software for clinical note transcription with the patient, who gave verbal consent to proceed.  History of Present Illness  Cognitive impairment and neuropsychiatric symptoms - Dementia managed with Aricept , Namenda , and Seroquel  - No recent episodes of wandering, aggression, or violence.  No recent falls. - The patient has not voiced any concerns noting that she has dementia.  Hypertension and electrolyte management - Hypertension treated with amlodipine  and Lasix  - Potassium supplementation ongoing - Blood pressure not monitored at home  Immunization status - Received COVID vaccine and influenza vaccine - No recent RSV vaccination  Palliative and supportive care - Hospice services have been discontinued  BP Readings from Last 3 Encounters:  12/19/23 98/65  11/22/23 126/60  10/20/23 134/82     Review of Systems See above   Past Medical History:  Diagnosis Date   Allergy     Dust Mites   Ankle pain    chronic   Diabetes mellitus, type 2 (HCC)    Hyperlipidemia    Hypertension    Memory loss    Osteoarthritis    RENAL INSUFFICIENCY, CHRONIC 04/16/2010   Rhinitis    allergic nos    Past Surgical History:  Procedure Laterality Date   CATARACT EXTRACTION     CHOLECYSTECTOMY     dental implants     PARTIAL HYSTERECTOMY     in her 30s    Current Outpatient Medications  Medication Instructions   acetaminophen  (TYLENOL ) 1,000 mg, 2 times daily PRN   amLODipine  (NORVASC ) 5 mg, Oral, Daily   aspirin EC 81 mg, Daily   atorvastatin  (LIPITOR) 10 mg, Oral, Daily   Blood Glucose Monitoring Suppl (FREESTYLE LITE) DEVI Use to monitor your blood sugars   Continuous Blood Gluc Sensor (FREESTYLE LIBRE 2 SENSOR) MISC 1 Device, Does not apply, Every 14 days    Cyanocobalamin  (B-12 PO) 1 tablet, Daily   donepezil  (ARICEPT ) 10 mg, Oral, Daily at bedtime   DULoxetine  (CYMBALTA ) 20 mg, Oral, Daily   EPINEPHrine (EPI-PEN) 0.3 mg,  Once   furosemide  (LASIX ) 40 mg, Oral, Daily   glucose blood (FREESTYLE LITE) test strip Use to monitor your blood sugars 4 times per day; E11.9   Insulin  Lispro Prot & Lispro (HUMALOG  75/25 MIX) (75-25) 100 UNIT/ML Kwikpen INJECT 35 UNITS UNDER THE SKIN DAILY BEFORE BREAKFAST   Insulin  Pen Needle (SURE COMFORT PEN NEEDLES) 31G X 5 MM MISC USE 1 PEN NEEDLE PER DAY   Lancets (FREESTYLE) lancets Use to monitor blood sugars 4 times per day, once before each meal and once at bedtime   loratadine  (CLARITIN ) 10 mg, Oral, Daily   memantine  (NAMENDA ) 10 mg, Oral, 2 times daily   Multiple Vitamins-Minerals (CENTRUM SILVER PO) 1 tablet, Daily   potassium chloride  (KLOR-CON  M) 10 MEQ tablet 20 mEq, Oral, Daily   QUEtiapine  (SEROQUEL ) 50 mg, Oral, Daily at bedtime   Trulicity  3 mg, Subcutaneous, Weekly       Objective:   Physical Exam BP 98/65   Pulse 89   Temp 97.6 F (36.4 C) (Oral)   Resp 14   Ht 5' 4 (1.626 m)   SpO2 (!) 85%   BMI 34.98 kg/m  General:   Well developed, NAD, BMI noted. HEENT:  Normocephalic . Face symmetric, atraumatic Lungs:  CTA  B Normal respiratory effort, no intercostal retractions, no accessory muscle use. Heart: RRR,  no murmur.  Lower extremities: no pretibial edema bilaterally  Skin: Not pale. Not jaundice Neurologic:  alert & follow commands.  Pleasantly demented. Speech normal, gait not tested, sits in a wheelchair. Psych--  Behavior appropriate. No anxious or depressed appearing.      Assessment     Assessment   DM  Dr kassie-- [name] + Neuropathy: Per foot exam 12-2014 HTN ---change ACE to ARB is 03/2014 Hyperlipidemia CRI  dx 2012  Sees Dr Alica  Morbid obesity DJD, had  chronic ankle pain Allergies -- dust mites , occ uses a inhaler , sees allergist, has shots q weeks  started ~ 08-2014 Dementia:  MMSE 7/218: 24, rx Aricept .  B12, RPR, sed rate and TSH normal MMSE 07/2017 :13 worse MRI brain 6/19:  prominent right temporal lobe volume loss.   Assessment & Plan Dementia with behavioral disturbances saw neurology 10/20/2023, recommend to continue Aricept , Namenda  and Seroquel .  No changes were suggested. Seems to be controlled/stable with current meds.  No change HTN.   continue  Amlodipine , Lasix , and potassium supplements.  BP slightly low today, I asked the patient husband to check BPs over the next week and let us  know if they are still low. 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  Check BMP DM: Per Endo Social: - Caregiver visit her Monday to Saturday from 10 AM to 8 PM.  All her needs seems to be met. - No longer under hospice General Health Maintenance Up-to-date on all vaccines except for RSV, recommend to proceed. RTC 5 months

## 2023-12-19 NOTE — Patient Instructions (Signed)
 GO TO THE LAB :  Get the blood work    Then, go to the front desk for the checkout Please make an appointment for a checkup in about 5 months    YOUR PLAN: DEMENTIA: Your dementia is stable with no recent behavioral issues. -Continue taking Aricept , Namenda , and Seroquel  as prescribed.  HYPERTENSION: Your blood pressure is well-controlled. -Continue taking Amlodipine , Lasix , and potassium supplements as prescribed. -We will check your potassium levels today.  GENERAL HEALTH MAINTENANCE: Your COVID and flu vaccinations are up to date  -Please obtain the RSV vaccine at your local pharmacy.

## 2023-12-19 NOTE — Telephone Encounter (Signed)
 Spoke w/ Pt's husband, Lynwood- PCP recommends to check BPs at home daily. Let us  know in 1 week if BPs are still low. Lynwood verbalized understanding.

## 2023-12-19 NOTE — Assessment & Plan Note (Addendum)
 Dementia with behavioral disturbances saw neurology 10/20/2023, recommend to continue Aricept , Namenda  and Seroquel .  No changes were suggested. Seems to be controlled/stable with current meds.  No change HTN.  continue  Amlodipine , Lasix , and potassium supplements.  BP slightly low today, I asked the patient husband to check BPs over the next week and let us  know if they are still low. 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  Check BMP DM: Per Endo Social: - Caregiver visit her Monday to Saturday from 10 AM to 8 PM.  All her needs seems to be met. - No longer under hospice General Health Maintenance Up-to-date on all vaccines except for RSV, recommend to proceed. RTC 5 months

## 2023-12-20 LAB — BASIC METABOLIC PANEL WITH GFR
BUN: 11 mg/dL (ref 6–23)
CO2: 31 meq/L (ref 19–32)
Calcium: 9.8 mg/dL (ref 8.4–10.5)
Chloride: 96 meq/L (ref 96–112)
Creatinine, Ser: 1.35 mg/dL — ABNORMAL HIGH (ref 0.40–1.20)
GFR: 35.77 mL/min — ABNORMAL LOW (ref >60.00)
Glucose, Bld: 255 mg/dL — ABNORMAL HIGH (ref 70–99)
Potassium: 4.1 meq/L (ref 3.5–5.1)
Sodium: 139 meq/L (ref 135–145)

## 2023-12-22 ENCOUNTER — Ambulatory Visit: Payer: Self-pay | Admitting: Internal Medicine

## 2024-01-02 ENCOUNTER — Telehealth: Payer: Self-pay | Admitting: Internal Medicine

## 2024-01-02 MED ORDER — ATORVASTATIN CALCIUM 10 MG PO TABS
10.0000 mg | ORAL_TABLET | Freq: Every day | ORAL | 1 refills | Status: AC
Start: 2024-01-02 — End: ?

## 2024-01-02 NOTE — Telephone Encounter (Signed)
 Copied from CRM [id number]. Topic: Clinical - Medication Refill >> Jan 02, 2024 12:47 PM Alfonso HERO wrote: Medication: atorvastatin  (LIPITOR) 10 MG tablet   Has the patient contacted their pharmacy? Yes (Agent: If no, request that the patient contact the pharmacy for the refill. If patient does not wish to contact the pharmacy document the reason why and proceed with request.) (Agent: If yes, when and what did the pharmacy advise?)  This is the patient's preferred pharmacy:  [facility] - [place], [place] - [address] [place] [address] Phone: [phone] Fax: [phone]  Is this the correct pharmacy for this prescription? Yes If no, delete pharmacy and type the correct one.   Has the prescription been filled recently? Yes  Is the patient out of the medication? Yes  Has the patient been seen for an appointment in the last year OR does the patient have an upcoming appointment? Yes  Can we respond through MyChart? Yes  Agent: Please be advised that Rx refills may take up to 3 business days. We ask that you follow-up with your pharmacy.

## 2024-01-02 NOTE — Telephone Encounter (Signed)
 Rx sent.

## 2024-01-20 ENCOUNTER — Other Ambulatory Visit: Payer: Self-pay | Admitting: Internal Medicine

## 2024-02-03 ENCOUNTER — Telehealth: Payer: Self-pay

## 2024-02-03 NOTE — Telephone Encounter (Signed)
 Patient was identified as falling into the [place] Measure - Diabetes.   Patient was: Appointment already scheduled for:  02/22/2024 with endocrinology.

## 2024-02-06 ENCOUNTER — Telehealth: Payer: Self-pay | Admitting: Adult Health

## 2024-02-06 NOTE — Telephone Encounter (Signed)
 Appointment details confirmed

## 2024-02-08 ENCOUNTER — Telehealth: Payer: Self-pay

## 2024-02-08 NOTE — Telephone Encounter (Signed)
 Patient was identified as falling into the [place] Measure - Diabetes.   Patient was: Appointment already scheduled for:  12/31 with endo.

## 2024-02-22 ENCOUNTER — Encounter: Payer: Self-pay | Admitting: Endocrinology

## 2024-02-22 ENCOUNTER — Ambulatory Visit: Payer: Self-pay | Admitting: Endocrinology

## 2024-02-22 ENCOUNTER — Ambulatory Visit (INDEPENDENT_AMBULATORY_CARE_PROVIDER_SITE_OTHER): Admitting: Endocrinology

## 2024-02-22 ENCOUNTER — Other Ambulatory Visit

## 2024-02-22 VITALS — BP 130/82 | HR 82 | Resp 16 | Ht 64.0 in | Wt 206.0 lb

## 2024-02-22 DIAGNOSIS — E1165 Type 2 diabetes mellitus with hyperglycemia: Secondary | ICD-10-CM

## 2024-02-22 DIAGNOSIS — Z794 Long term (current) use of insulin: Secondary | ICD-10-CM

## 2024-02-22 LAB — POCT GLYCOSYLATED HEMOGLOBIN (HGB A1C): Hemoglobin A1C: 8.4 % — AB (ref 4.0–5.6)

## 2024-02-22 NOTE — Progress Notes (Signed)
 "  Outpatient Endocrinology Note Buelah Rennie, MD  02/22/2024  Patient's Name: Angel Green    DOB: 06/10/1940    MRN: [id number]                                                    REASON OF VISIT: Follow up of type 2 diabetes mellitus  PCP: Amon Aloysius BRAVO, MD  HISTORY OF PRESENT ILLNESS:   Angel Green is a 83 y.o. old female with past medical history listed below, is here for follow up  type 2 diabetes mellitus.   Pertinent Diabetes History: He was diagnosed with type 2 diabetes mellitus in 1999.  She has been on insulin  therapy from 2010 and Trulicity  was started in 2019.    Chronic Diabetes Complications : Retinopathy: no. Last ophthalmology exam was done on 04/2022, reportedly. Nephropathy: CKD, following with nephrology. Peripheral neuropathy:no Coronary artery disease: no Stroke: no  Relevant comorbidities and cardiovascular risk factors: Obesity: yes Body mass index is 35.36 kg/m.  Hypertension: yes Hyperlipidemia. Yes, on a statin.  Current / Home Diabetic regimen includes: Humulin mix 75/25 : 35 units daily in the morning with breakfast. Trulicity  3 mg weekly. Saturdays.   Prior diabetic medications: She used to be on Trulicity  4.5 mg weekly was decreased due to weight loss.  Evening dose of Humalin mix was stopped due to hypoglycemia.  Glycemic data:    CONTINUOUS GLUCOSE MONITORING SYSTEM (CGMS) INTERPRETATION:             FreeStyle Libre 3+ CGM-  Sensor Download (Sensor download was reviewed and summarized below.) Dates: December 18 to February 22, 2024, 14 days Glucose Management Indicator: 7.4%  % data captured: 85%     Impression: Mostly acceptable blood sugar with rare hyperglycemia with blood sugar up to 260-280 related to high carb meal.  No concerning hyperglycemia.  No hypoglycemia.  Hypoglycemia: Patient has no hypoglycemic episodes. Patient has hypoglycemia awareness.  Factors modifying glucose control: 1.  Diabetic diet assessment: 3  meals a day last meal of the day around 5 to 6 PM.  She eats breakfast around 11 AM.  Denies bedtime snack.  2.  Staying active or exercising: No formal exercise.  3.  Medication compliance: compliant all Of the time.  Interval history CGM data as reviewed above.  Occasional/rare mild hyperglycemia otherwise mostly acceptable blood sugar.  Hemoglobin A1c 8.4%.  Diabetes regimen as reviewed and noted above.  Denies GI issues regarding Trulicity .  Appetite is fair some of the time she does not like to eat. Patient is accompanied by her husband and caregiver in the clinic today. No other complaints today.  REVIEW OF SYSTEMS As per history of present illness.   PAST MEDICAL HISTORY: Past Medical History:  Diagnosis Date   Allergy     Dust Mites   Ankle pain    chronic   Diabetes mellitus, type 2 (HCC)    Hyperlipidemia    Hypertension    Memory loss    Osteoarthritis    RENAL INSUFFICIENCY, CHRONIC 04/16/2010   Rhinitis    allergic nos    PAST SURGICAL HISTORY: Past Surgical History:  Procedure Laterality Date   CATARACT EXTRACTION     CHOLECYSTECTOMY     dental implants     PARTIAL HYSTERECTOMY     in her 30s    ALLERGIES:  Allergies  Allergen Reactions   Dust Mite Mixed Allergen Ext [Mite (D. Farinae)]     FAMILY HISTORY:  Family History  Problem Relation Age of Onset   Healthy Brother    Healthy Son    Healthy Son    Other Mother        unsure of history - old age   Other Father        unsure of history - old age   Heart attack Neg Hx    Colon cancer Neg Hx    Breast cancer Neg Hx    Stroke Neg Hx     SOCIAL HISTORY: Social History   Socioeconomic History   Marital status: Married    Spouse name: Not on file   Number of children: 2   Years of education: some college   Highest education level: Not on file  Occupational History   Occupation:  retired  Tobacco Use   Smoking status: Never   Smokeless tobacco: Never  Vaping Use   Vaping status:  Never Used  Substance and Sexual Activity   Alcohol use: No    Alcohol/week: 0.0 standard drinks of alcohol   Drug use: No   Sexual activity: Yes    Partners: Male  Other Topics Concern   Not on file  Social History Narrative   Husband is a Optician, [employer].   Right-handed.   1 cup caffeine daily.   Lives at home with husband.          Social Drivers of Health   Tobacco Use: Low Risk (02/22/2024)   Patient History    Smoking Tobacco Use: Never    Smokeless Tobacco Use: Never    Passive Exposure: Not on file  Financial Resource Strain: Low Risk (08/17/2021)   Overall Financial Resource Strain (CARDIA)    Difficulty of Paying Living Expenses: Not very hard  Food Insecurity: No Food Insecurity (02/11/2022)   Hunger Vital Sign    Worried About Running Out of Food in the Last Year: Never true    Ran Out of Food in the Last Year: Never true  Transportation Needs: No Transportation Needs (02/11/2022)   PRAPARE - Administrator, Civil Service (Medical): No    Lack of Transportation (Non-Medical): No  Physical Activity: Inactive (05/18/2022)   Exercise Vital Sign    Days of Exercise per Week: 0 days    Minutes of Exercise per Session: 0 min  Stress: Not on file  Social Connections: Not on file  Depression (PHQ2-9): Low Risk (10/04/2023)   Depression (PHQ2-9)    PHQ-2 Score: 0  Alcohol Screen: Low Risk (05/18/2022)   Alcohol Screen    Last Alcohol Screening Score (AUDIT): 0  Housing: Low Risk (02/08/2022)   Housing    Last Housing Risk Score: 0  Utilities: Not At Risk (02/08/2022)   AHC Utilities    Threatened with loss of utilities: No  Health Literacy: Not on file    MEDICATIONS:  Current Outpatient Medications  Medication Sig Dispense Refill   acetaminophen  (TYLENOL ) 500 MG tablet Take 1,000 mg by mouth 2 (two) times daily as needed for moderate pain (pain score 4-6).     amLODipine  (NORVASC ) 5 MG tablet Take 1 tablet (5 mg total) by mouth daily. 90 tablet 1    aspirin EC 81 MG tablet Take 1 tablet (81 mg total) by mouth daily. Swallow whole.     atorvastatin  (LIPITOR) 10 MG tablet Take 1 tablet (10 mg total) by mouth  daily. 90 tablet 1   Blood Glucose Monitoring Suppl (FREESTYLE LITE) DEVI Use to monitor your blood sugars 1 each 0   Continuous Blood Gluc Sensor (FREESTYLE LIBRE 2 SENSOR) MISC 1 Device by Does not apply route every 14 (fourteen) days. 6 each 3   Cyanocobalamin  (B-12 PO) Take 1 tablet by mouth daily. Takes 5000 mcg by mouth daily.     donepezil  (ARICEPT ) 10 MG tablet Take 1 tablet (10 mg total) by mouth at bedtime. 90 tablet 3   Dulaglutide  (TRULICITY ) 3 MG/0.5ML SOAJ Inject 3 mg into the skin once a week. 6 mL 4   DULoxetine  (CYMBALTA ) 20 MG capsule Take 1 capsule (20 mg total) by mouth daily. 90 capsule 1   EPINEPHrine 0.3 mg/0.3 mL IJ SOAJ injection Inject 0.3 mg into the muscle once. Reported on 06/30/2015     furosemide  (LASIX ) 20 MG tablet TAKE 2 TABLETS DAILY 180 tablet 3   glucose blood (FREESTYLE LITE) test strip Use to monitor your blood sugars 4 times per day; E11.9 400 each 12   Insulin  Lispro Prot & Lispro (HUMALOG  75/25 MIX) (75-25) 100 UNIT/ML Kwikpen INJECT 35 UNITS UNDER THE SKIN DAILY BEFORE BREAKFAST 15 mL 3   Insulin  Pen Needle (SURE COMFORT PEN NEEDLES) 31G X 5 MM MISC USE 1 PEN NEEDLE PER DAY 100 each 3   Lancets (FREESTYLE) lancets Use to monitor blood sugars 4 times per day, once before each meal and once at bedtime 400 each 12   loratadine  (CLARITIN ) 10 MG tablet Take 1 tablet (10 mg total) by mouth daily. 90 tablet 1   memantine  (NAMENDA ) 10 MG tablet Take 1 tablet (10 mg total) by mouth 2 (two) times daily. 180 tablet 3   Multiple Vitamins-Minerals (CENTRUM SILVER PO) Take 1 tablet by mouth daily.     potassium chloride  (KLOR-CON  M) 10 MEQ tablet TAKE 2 TABLETS DAILY 180 tablet 3   QUEtiapine  (SEROQUEL ) 50 MG tablet Take 1 tablet (50 mg total) by mouth at bedtime. 90 tablet 3   No current facility-administered  medications for this visit.    PHYSICAL EXAM: Vitals:   02/22/24 1430  BP: 130/82  Pulse: 82  Resp: 16  SpO2: 99%  Weight: 206 lb (93.4 kg)  Height: 5' 4 (1.626 m)   Body mass index is 35.36 kg/m.  Wt Readings from Last 3 Encounters:  02/22/24 206 lb (93.4 kg)  11/22/23 203 lb 12.8 oz (92.4 kg)  10/20/23 186 lb 6.4 oz (84.6 kg)    General: Well developed, well nourished female in no apparent distress.  HEENT: AT/[place], no external lesions.  Eyes: Conjunctiva clear and no icterus. Neck: Neck supple  Lungs: Respirations not labored Neurologic: Alert, oriented, normal speech Extremities / Skin: Dry.   Psychiatric: Does not appear depressed or anxious  Diabetic Foot Exam - Simple   No data filed     LABS Reviewed Lab Results  Component Value Date   HGBA1C 8.4 (A) 02/22/2024   HGBA1C 8.2 (A) 11/22/2023   HGBA1C 7.8 (A) 05/10/2023   Lab Results  Component Value Date   FRUCTOSAMINE 299 (H) 06/26/2020   FRUCTOSAMINE 370 (H) 04/22/2015   Lab Results  Component Value Date   CHOL 157 05/13/2023   HDL 56.10 05/13/2023   LDLCALC 78 05/13/2023   TRIG 110.0 05/13/2023   CHOLHDL 3 05/13/2023   Lab Results  Component Value Date   MICRALBCREAT 3.0 01/11/2023   MICRALBCREAT 3.7 01/14/2009   Lab Results  Component Value Date  CREATININE 1.35 (H) 12/19/2023   Lab Results  Component Value Date   GFR 35.77 (L) 12/19/2023    ASSESSMENT / PLAN  1. Uncontrolled type 2 diabetes mellitus with hyperglycemia, with long-term current use of insulin  (HCC)     Diabetes Mellitus type 2, complicated by CKD - Diabetic status / severity: Uncontrolled  Lab Results  Component Value Date   HGBA1C 8.4 (A) 02/22/2024    - Hemoglobin A1c goal : <7.5-8%, taking the account of her age and comorbidities.   Will keep permissive hyperglycemia.  Overall mildly uncontrolled diabetes however in acceptable range.  - Medications: Will not increase the dose of Trulicity  to avoid  suppression of appetite she has not been eating most of the time even now.  I) continue Humalog  mix 75/25: 35 units with breakfast.  II) continue Trulicity  3 mg weekly.  - Home glucose testing: Continue CGM or check blood sugar as needed.  - Discussed/ Gave Hypoglycemia treatment plan.  # Consult : not required at this time.   # Annual urine for microalbuminuria/ creatinine ratio, no microalbuminuria currently, following with nephrology.  Will check today. Last  Lab Results  Component Value Date   MICRALBCREAT 3.0 01/11/2023    # Foot check nightly.  # Annual dilated diabetic eye exams.   - Diet: Make healthy diabetic food choices   2. Blood pressure  -  BP Readings from Last 1 Encounters:  02/22/24 130/82    - Control is in target.  - No change in current plans.  3. Lipid status / Hyperlipidemia - Last  Lab Results  Component Value Date   LDLCALC 78 05/13/2023   - Continue atorvastatin  10 mg daily.  Managed by primary care provider.  Diagnoses and all orders for this visit:  Uncontrolled type 2 diabetes mellitus with hyperglycemia, with long-term current use of insulin  (HCC) -     POCT glycosylated hemoglobin (Hb A1C) -     Microalbumin / creatinine urine ratio    DISPOSITION Follow up in clinic in 3 months suggested.  Labs today as ordered.   All questions answered and patient verbalized understanding of the plan.  Angel Polasek, MD [facility] Endocrinology [facility] [address] [place], [address] [place], [place] [address] Phone # [phone]  At least part of this note was generated using voice recognition software. Inadvertent word errors may have occurred, which were not recognized during the proofreading process. "

## 2024-02-24 ENCOUNTER — Other Ambulatory Visit: Payer: Self-pay

## 2024-02-24 ENCOUNTER — Other Ambulatory Visit

## 2024-02-24 DIAGNOSIS — E119 Type 2 diabetes mellitus without complications: Secondary | ICD-10-CM

## 2024-02-24 MED ORDER — SURE COMFORT PEN NEEDLES 31G X 5 MM MISC: 3 refills | Status: AC

## 2024-02-25 LAB — MICROALBUMIN / CREATININE URINE RATIO
Creatinine, Urine: 106 mg/dL (ref 20–275)
Microalb Creat Ratio: 3 mg/g{creat}
Microalb, Ur: 0.3 mg/dL

## 2024-02-29 ENCOUNTER — Other Ambulatory Visit: Payer: Self-pay | Admitting: Internal Medicine

## 2024-02-29 DIAGNOSIS — E876 Hypokalemia: Secondary | ICD-10-CM

## 2024-03-12 ENCOUNTER — Ambulatory Visit: Admitting: Podiatry

## 2024-03-14 ENCOUNTER — Encounter: Payer: Self-pay | Admitting: Podiatry

## 2024-03-14 ENCOUNTER — Ambulatory Visit: Admitting: Podiatry

## 2024-03-14 DIAGNOSIS — B351 Tinea unguium: Secondary | ICD-10-CM

## 2024-03-14 DIAGNOSIS — M79675 Pain in left toe(s): Secondary | ICD-10-CM

## 2024-03-14 DIAGNOSIS — E1142 Type 2 diabetes mellitus with diabetic polyneuropathy: Secondary | ICD-10-CM

## 2024-03-14 DIAGNOSIS — I739 Peripheral vascular disease, unspecified: Secondary | ICD-10-CM | POA: Diagnosis not present

## 2024-03-14 DIAGNOSIS — M79674 Pain in right toe(s): Secondary | ICD-10-CM

## 2024-03-14 NOTE — Progress Notes (Signed)
"  °  Subjective:  Patient ID: Angel Green, female    DOB: 02/10/1941,   MRN: [id number]  No chief complaint on file.   84 y.o. female presents for concern of thickened elongated and painful nails that are difficult to trim. Requesting to have them trimmed today. Relates burning and tingling in their feet. Patient is diabetic and last A1c was  Lab Results  Component Value Date   HGBA1C 8.4 (A) 02/22/2024   .   PCP:  Amon Aloysius BRAVO, MD    . Denies any other pedal complaints. Denies n/v/f/c.   Past Medical History:  Diagnosis Date   Allergy     Dust Mites   Ankle pain    chronic   Diabetes mellitus, type 2 (HCC)    Hyperlipidemia    Hypertension    Memory loss    Osteoarthritis    RENAL INSUFFICIENCY, CHRONIC 04/16/2010   Rhinitis    allergic nos    Objective:  Physical Exam: Vascular: DP/PT pulses 2/4 bilateral. CFT <3 seconds. Absent hair growth on digits. Edema noted to bilateral lower extremities. Xerosis noted bilaterally.  Skin. No lacerations or abrasions bilateral feet. Nails 1-5 bilateral  are thickened discolored and elongated with subungual debris.  Musculoskeletal: MMT 5/5 bilateral lower extremities in DF, PF, Inversion and Eversion. Deceased ROM in DF of ankle joint.  Neurological: Sensation intact to light touch. Protective sensation diminished bilateral.    Assessment:   1. Pain due to onychomycosis of toenails of both feet   2. Type 2 diabetes mellitus with peripheral neuropathy (HCC)   3. PAD (peripheral artery disease)       Plan:  Patient was evaluated and treated and all questions answered. -Discussed and educated patient on diabetic foot care, especially with  regards to the vascular, neurological and musculoskeletal systems.  -Stressed the importance of good glycemic control and the detriment of not  controlling glucose levels in relation to the foot. -Discussed supportive shoes at all times and checking feet regularly.  -Mechanically debrided all  nails 1-5 bilateral using sterile nail nipper and filed with dremel without incident  -Answered all patient questions -Patient to return  in 3 months for at risk foot care -Patient advised to call the office if any problems or questions arise in the meantime.   Asberry Failing, DPM    "

## 2024-03-16 ENCOUNTER — Other Ambulatory Visit: Payer: Self-pay | Admitting: Internal Medicine

## 2024-03-20 ENCOUNTER — Telehealth: Payer: Self-pay

## 2024-03-20 DIAGNOSIS — E1165 Type 2 diabetes mellitus with hyperglycemia: Secondary | ICD-10-CM

## 2024-03-20 MED ORDER — SEMAGLUTIDE (1 MG/DOSE) 4 MG/3ML ~~LOC~~ SOPN: 1.0000 mg | PEN_INJECTOR | SUBCUTANEOUS | 3 refills | Status: AC

## 2024-03-20 NOTE — Telephone Encounter (Signed)
 Yes, we can switch to Ozmepic 1mg  weekly. I sent prescription.

## 2024-03-20 NOTE — Telephone Encounter (Signed)
 Notification from [facility] that trulicity  is not available.  Would you like to change to something else?

## 2024-03-22 ENCOUNTER — Telehealth: Payer: Self-pay | Admitting: Adult Health

## 2024-03-22 ENCOUNTER — Telehealth: Payer: Self-pay

## 2024-03-22 NOTE — Telephone Encounter (Signed)
 Copied from CRM [id number]. Topic: Clinical - Medication Question >> Mar 22, 2024  8:22 AM Revonda D wrote: Reason for CRM: Pt's husband is wanting to know if Dr.[name] could submit a new prescription for the memantine  (NAMENDA ) 10 MG tablet, and QUEtiapine  (SEROQUEL ) 50 MG tablet. Husband would like a callback with an update on this request.

## 2024-03-22 NOTE — Telephone Encounter (Signed)
 Error CRM sent. Pt's husband Lynwood should have been informed that Namenda  is prescribed by Harlene Bogaert at [facility]. Refills will need to come from her office.

## 2024-03-22 NOTE — Telephone Encounter (Signed)
 Patient spouse called back to check on the status of previous requested medication and was advised of previous message that medication needed to be request through [facility], NP Harlene Bogaert office and provided to contact number  [phone].

## 2024-03-22 NOTE — Telephone Encounter (Signed)
 Pt's husband called stating that [facility] is backed up with medication deliveries due to the weather and the pt is needing her memantine  (NAMENDA ) 10 MG tablet, and QUEtiapine  (SEROQUEL ) 50 MG tablet filled. Husband would like to know if a 30 day qt can be sent to his local pharmacy while they wait for the delivery. He would like it sent to the [facility] on [address]. Please advise.

## 2024-03-23 NOTE — Telephone Encounter (Signed)
 Vrzal, Corin K   03/22/2024  5:42 PM  Type: General  Thank you for submitting this issue for investigation. After a thorough review, we have determined that an error was made by an E2C2 team member. We have addressed the matter directly with the agent involved. We appreciate you bringing this to our attention.   Error/Investigation Details: Patient husband called in requesting refill on patient Namenda  and Qulipta. Specialist reviewed chart and stated these were prescribed by Harlene Bogaert. Patient husband was uncertain who this provider is and did not recognize the name and stated that patient did not see this provider. Specialist was unable to verify provider location to see if that helped identify who patient husband needed to contact. Specialist did not include this info in the CRM to help identify the reason the CRM was sent instead of directing the patient husband to call [facility] office. Specialist will be coached accordingly. [employer] JTAPI ID: [id number]   Per chart review, patient husband called back and instructions to call [facility] office were relayed. No outreach needed at this time.

## 2024-03-26 ENCOUNTER — Other Ambulatory Visit: Payer: Self-pay

## 2024-03-26 MED ORDER — MEMANTINE HCL 10 MG PO TABS: 10.0000 mg | ORAL_TABLET | Freq: Two times a day (BID) | ORAL | 0 refills | Status: AC

## 2024-03-26 MED ORDER — QUETIAPINE FUMARATE 50 MG PO TABS: 50.0000 mg | ORAL_TABLET | Freq: Every day | ORAL | 0 refills | Status: AC

## 2024-04-09 ENCOUNTER — Encounter

## 2024-04-09 ENCOUNTER — Other Ambulatory Visit

## 2024-05-18 ENCOUNTER — Ambulatory Visit: Admitting: Internal Medicine

## 2024-05-31 ENCOUNTER — Ambulatory Visit: Admitting: Endocrinology

## 2024-06-12 ENCOUNTER — Ambulatory Visit: Admitting: Podiatry

## 2024-10-25 ENCOUNTER — Ambulatory Visit: Admitting: Adult Health
